# Patient Record
Sex: Female | Born: 1960 | Race: Black or African American | Hispanic: No | Marital: Single | Smoking: Former smoker
Health system: Southern US, Community
[De-identification: ages and names within clinical notes are randomized; demographics above are authoritative.]

## PROBLEM LIST (undated history)

## (undated) DIAGNOSIS — I428 Other cardiomyopathies: Secondary | ICD-10-CM

## (undated) DIAGNOSIS — E119 Type 2 diabetes mellitus without complications: Secondary | ICD-10-CM

## (undated) DIAGNOSIS — Z95810 Presence of automatic (implantable) cardiac defibrillator: Secondary | ICD-10-CM

## (undated) DIAGNOSIS — I5032 Chronic diastolic (congestive) heart failure: Secondary | ICD-10-CM

## (undated) DIAGNOSIS — G8929 Other chronic pain: Secondary | ICD-10-CM

## (undated) DIAGNOSIS — M549 Dorsalgia, unspecified: Secondary | ICD-10-CM

## (undated) DIAGNOSIS — I10 Essential (primary) hypertension: Secondary | ICD-10-CM

## (undated) DIAGNOSIS — J42 Unspecified chronic bronchitis: Secondary | ICD-10-CM

## (undated) DIAGNOSIS — G479 Sleep disorder, unspecified: Secondary | ICD-10-CM

## (undated) DIAGNOSIS — J449 Chronic obstructive pulmonary disease, unspecified: Secondary | ICD-10-CM

## (undated) DIAGNOSIS — N289 Disorder of kidney and ureter, unspecified: Secondary | ICD-10-CM

## (undated) DIAGNOSIS — F4310 Post-traumatic stress disorder, unspecified: Secondary | ICD-10-CM

## (undated) DIAGNOSIS — M069 Rheumatoid arthritis, unspecified: Secondary | ICD-10-CM

## (undated) DIAGNOSIS — R351 Nocturia: Secondary | ICD-10-CM

## (undated) DIAGNOSIS — M25569 Pain in unspecified knee: Secondary | ICD-10-CM

## (undated) DIAGNOSIS — F32A Depression, unspecified: Secondary | ICD-10-CM

## (undated) DIAGNOSIS — M545 Low back pain, unspecified: Secondary | ICD-10-CM

## (undated) DIAGNOSIS — F419 Anxiety disorder, unspecified: Secondary | ICD-10-CM

## (undated) DIAGNOSIS — Z794 Long term (current) use of insulin: Secondary | ICD-10-CM

## (undated) DIAGNOSIS — F329 Major depressive disorder, single episode, unspecified: Secondary | ICD-10-CM

## (undated) DIAGNOSIS — I5022 Chronic systolic (congestive) heart failure: Secondary | ICD-10-CM

## (undated) DIAGNOSIS — I82409 Acute embolism and thrombosis of unspecified deep veins of unspecified lower extremity: Secondary | ICD-10-CM

## (undated) DIAGNOSIS — J189 Pneumonia, unspecified organism: Secondary | ICD-10-CM

## (undated) DIAGNOSIS — G4733 Obstructive sleep apnea (adult) (pediatric): Secondary | ICD-10-CM

## (undated) DIAGNOSIS — D509 Iron deficiency anemia, unspecified: Secondary | ICD-10-CM

## (undated) DIAGNOSIS — K219 Gastro-esophageal reflux disease without esophagitis: Secondary | ICD-10-CM

## (undated) DIAGNOSIS — N946 Dysmenorrhea, unspecified: Secondary | ICD-10-CM

## (undated) DIAGNOSIS — IMO0001 Reserved for inherently not codable concepts without codable children: Secondary | ICD-10-CM

## (undated) DIAGNOSIS — R739 Hyperglycemia, unspecified: Secondary | ICD-10-CM

## (undated) DIAGNOSIS — K529 Noninfective gastroenteritis and colitis, unspecified: Secondary | ICD-10-CM

## (undated) DIAGNOSIS — I340 Nonrheumatic mitral (valve) insufficiency: Secondary | ICD-10-CM

## (undated) DIAGNOSIS — N390 Urinary tract infection, site not specified: Secondary | ICD-10-CM

## (undated) DIAGNOSIS — Z9989 Dependence on other enabling machines and devices: Secondary | ICD-10-CM

## (undated) HISTORY — DX: Gastro-esophageal reflux disease without esophagitis: K21.9

## (undated) HISTORY — DX: Rheumatoid arthritis, unspecified: M06.9

## (undated) HISTORY — DX: Major depressive disorder, single episode, unspecified: F32.9

## (undated) HISTORY — DX: Essential (primary) hypertension: I10

## (undated) HISTORY — DX: Dysmenorrhea, unspecified: N94.6

## (undated) HISTORY — DX: Iron deficiency anemia, unspecified: D50.9

## (undated) HISTORY — PX: CARDIAC CATHETERIZATION: SHX172

## (undated) HISTORY — DX: Noninfective gastroenteritis and colitis, unspecified: K52.9

## (undated) HISTORY — DX: Morbid (severe) obesity due to excess calories: E66.01

## (undated) HISTORY — PX: CARDIAC DEFIBRILLATOR PLACEMENT: SHX171

## (undated) HISTORY — DX: Depression, unspecified: F32.A

## (undated) HISTORY — DX: Nonrheumatic mitral (valve) insufficiency: I34.0

## (undated) HISTORY — DX: Acute embolism and thrombosis of unspecified deep veins of unspecified lower extremity: I82.409

---
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
## 2000-08-31 ENCOUNTER — Emergency Department (HOSPITAL_COMMUNITY): Admission: EM | Admit: 2000-08-31 | Discharge: 2000-08-31 | Payer: Self-pay | Admitting: Emergency Medicine

## 2001-02-22 ENCOUNTER — Ambulatory Visit (HOSPITAL_COMMUNITY): Admission: RE | Admit: 2001-02-22 | Discharge: 2001-02-22 | Payer: Self-pay | Admitting: Internal Medicine

## 2001-02-22 ENCOUNTER — Encounter: Payer: Self-pay | Admitting: Internal Medicine

## 2001-06-29 ENCOUNTER — Emergency Department (HOSPITAL_COMMUNITY): Admission: EM | Admit: 2001-06-29 | Discharge: 2001-06-29 | Payer: Self-pay | Admitting: Emergency Medicine

## 2001-07-03 ENCOUNTER — Emergency Department (HOSPITAL_COMMUNITY): Admission: EM | Admit: 2001-07-03 | Discharge: 2001-07-03 | Payer: Self-pay

## 2001-07-27 ENCOUNTER — Emergency Department (HOSPITAL_COMMUNITY): Admission: EM | Admit: 2001-07-27 | Discharge: 2001-07-27 | Payer: Self-pay | Admitting: Emergency Medicine

## 2004-02-17 ENCOUNTER — Emergency Department (HOSPITAL_COMMUNITY): Admission: EM | Admit: 2004-02-17 | Discharge: 2004-02-17 | Payer: Self-pay | Admitting: Emergency Medicine

## 2004-02-19 ENCOUNTER — Emergency Department (HOSPITAL_COMMUNITY): Admission: EM | Admit: 2004-02-19 | Discharge: 2004-02-20 | Payer: Self-pay | Admitting: Emergency Medicine

## 2004-02-25 ENCOUNTER — Emergency Department (HOSPITAL_COMMUNITY): Admission: EM | Admit: 2004-02-25 | Discharge: 2004-02-25 | Payer: Self-pay | Admitting: Emergency Medicine

## 2004-03-03 ENCOUNTER — Ambulatory Visit: Payer: Self-pay | Admitting: Internal Medicine

## 2004-03-05 ENCOUNTER — Ambulatory Visit: Payer: Self-pay | Admitting: Internal Medicine

## 2004-03-08 ENCOUNTER — Ambulatory Visit: Payer: Self-pay | Admitting: *Deleted

## 2004-03-22 ENCOUNTER — Ambulatory Visit: Payer: Self-pay | Admitting: Internal Medicine

## 2004-03-26 ENCOUNTER — Ambulatory Visit (HOSPITAL_COMMUNITY): Admission: RE | Admit: 2004-03-26 | Discharge: 2004-03-26 | Payer: Self-pay | Admitting: Internal Medicine

## 2004-04-05 ENCOUNTER — Ambulatory Visit: Payer: Self-pay | Admitting: Internal Medicine

## 2004-04-13 ENCOUNTER — Ambulatory Visit: Payer: Self-pay | Admitting: Internal Medicine

## 2004-05-05 ENCOUNTER — Ambulatory Visit: Payer: Self-pay | Admitting: Internal Medicine

## 2004-05-20 ENCOUNTER — Ambulatory Visit: Payer: Self-pay | Admitting: Internal Medicine

## 2004-06-11 ENCOUNTER — Ambulatory Visit (HOSPITAL_BASED_OUTPATIENT_CLINIC_OR_DEPARTMENT_OTHER): Admission: RE | Admit: 2004-06-11 | Discharge: 2004-06-11 | Payer: Self-pay | Admitting: Orthopedic Surgery

## 2004-06-11 HISTORY — PX: KNEE ARTHROSCOPY: SHX127

## 2004-07-26 ENCOUNTER — Ambulatory Visit: Payer: Self-pay | Admitting: Internal Medicine

## 2004-12-21 ENCOUNTER — Ambulatory Visit: Payer: Self-pay | Admitting: Internal Medicine

## 2004-12-29 ENCOUNTER — Ambulatory Visit: Payer: Self-pay | Admitting: Internal Medicine

## 2004-12-31 ENCOUNTER — Ambulatory Visit (HOSPITAL_COMMUNITY): Admission: RE | Admit: 2004-12-31 | Discharge: 2004-12-31 | Payer: Self-pay | Admitting: Internal Medicine

## 2005-01-24 ENCOUNTER — Ambulatory Visit: Payer: Self-pay | Admitting: Family Medicine

## 2005-01-28 ENCOUNTER — Ambulatory Visit (HOSPITAL_COMMUNITY): Admission: RE | Admit: 2005-01-28 | Discharge: 2005-01-28 | Payer: Self-pay | Admitting: Internal Medicine

## 2005-02-05 ENCOUNTER — Emergency Department (HOSPITAL_COMMUNITY): Admission: EM | Admit: 2005-02-05 | Discharge: 2005-02-05 | Payer: Self-pay | Admitting: Emergency Medicine

## 2005-02-15 ENCOUNTER — Ambulatory Visit: Payer: Self-pay | Admitting: Family Medicine

## 2005-04-18 ENCOUNTER — Ambulatory Visit: Payer: Self-pay | Admitting: Internal Medicine

## 2005-05-05 ENCOUNTER — Ambulatory Visit: Payer: Self-pay | Admitting: Internal Medicine

## 2005-06-12 ENCOUNTER — Emergency Department (HOSPITAL_COMMUNITY): Admission: EM | Admit: 2005-06-12 | Discharge: 2005-06-12 | Payer: Self-pay | Admitting: Emergency Medicine

## 2005-06-22 ENCOUNTER — Ambulatory Visit: Payer: Self-pay | Admitting: Family Medicine

## 2005-07-01 ENCOUNTER — Emergency Department (HOSPITAL_COMMUNITY): Admission: EM | Admit: 2005-07-01 | Discharge: 2005-07-01 | Payer: Self-pay | Admitting: Emergency Medicine

## 2005-07-29 ENCOUNTER — Ambulatory Visit: Payer: Self-pay | Admitting: Internal Medicine

## 2005-08-16 ENCOUNTER — Emergency Department (HOSPITAL_COMMUNITY): Admission: EM | Admit: 2005-08-16 | Discharge: 2005-08-16 | Payer: Self-pay | Admitting: Emergency Medicine

## 2005-09-23 ENCOUNTER — Ambulatory Visit: Payer: Self-pay | Admitting: Internal Medicine

## 2005-10-28 ENCOUNTER — Emergency Department (HOSPITAL_COMMUNITY): Admission: EM | Admit: 2005-10-28 | Discharge: 2005-10-28 | Payer: Self-pay | Admitting: Emergency Medicine

## 2005-11-09 ENCOUNTER — Ambulatory Visit: Payer: Self-pay | Admitting: Family Medicine

## 2005-11-14 ENCOUNTER — Ambulatory Visit: Payer: Self-pay | Admitting: Family Medicine

## 2005-11-18 ENCOUNTER — Ambulatory Visit (HOSPITAL_COMMUNITY): Admission: RE | Admit: 2005-11-18 | Discharge: 2005-11-18 | Payer: Self-pay | Admitting: Family Medicine

## 2005-11-30 ENCOUNTER — Emergency Department (HOSPITAL_COMMUNITY): Admission: EM | Admit: 2005-11-30 | Discharge: 2005-11-30 | Payer: Self-pay | Admitting: Emergency Medicine

## 2005-12-06 ENCOUNTER — Ambulatory Visit: Payer: Self-pay | Admitting: Family Medicine

## 2005-12-16 ENCOUNTER — Ambulatory Visit: Payer: Self-pay | Admitting: Family Medicine

## 2006-01-04 ENCOUNTER — Ambulatory Visit: Payer: Self-pay | Admitting: Family Medicine

## 2006-01-20 ENCOUNTER — Ambulatory Visit: Payer: Self-pay | Admitting: Family Medicine

## 2006-02-07 ENCOUNTER — Ambulatory Visit: Payer: Self-pay | Admitting: Family Medicine

## 2006-03-14 ENCOUNTER — Ambulatory Visit (HOSPITAL_BASED_OUTPATIENT_CLINIC_OR_DEPARTMENT_OTHER): Admission: RE | Admit: 2006-03-14 | Discharge: 2006-03-14 | Payer: Self-pay | Admitting: Family Medicine

## 2006-03-19 ENCOUNTER — Ambulatory Visit: Payer: Self-pay | Admitting: Internal Medicine

## 2006-03-27 ENCOUNTER — Ambulatory Visit: Payer: Self-pay | Admitting: Family Medicine

## 2006-04-11 ENCOUNTER — Ambulatory Visit: Payer: Self-pay | Admitting: Family Medicine

## 2006-04-25 ENCOUNTER — Ambulatory Visit: Payer: Self-pay | Admitting: Family Medicine

## 2006-05-19 ENCOUNTER — Ambulatory Visit: Payer: Self-pay | Admitting: Family Medicine

## 2006-06-26 ENCOUNTER — Ambulatory Visit: Payer: Self-pay | Admitting: Internal Medicine

## 2006-08-11 ENCOUNTER — Ambulatory Visit: Payer: Self-pay | Admitting: Family Medicine

## 2006-08-15 ENCOUNTER — Emergency Department (HOSPITAL_COMMUNITY): Admission: EM | Admit: 2006-08-15 | Discharge: 2006-08-15 | Payer: Self-pay | Admitting: Emergency Medicine

## 2006-08-15 ENCOUNTER — Ambulatory Visit: Payer: Self-pay | Admitting: Vascular Surgery

## 2006-08-15 ENCOUNTER — Encounter: Payer: Self-pay | Admitting: Vascular Surgery

## 2006-08-28 ENCOUNTER — Ambulatory Visit: Payer: Self-pay | Admitting: Family Medicine

## 2006-10-30 ENCOUNTER — Ambulatory Visit: Payer: Self-pay | Admitting: Family Medicine

## 2006-11-13 DIAGNOSIS — S83259A Bucket-handle tear of lateral meniscus, current injury, unspecified knee, initial encounter: Secondary | ICD-10-CM | POA: Insufficient documentation

## 2006-11-13 DIAGNOSIS — M549 Dorsalgia, unspecified: Secondary | ICD-10-CM | POA: Insufficient documentation

## 2006-11-13 DIAGNOSIS — G4733 Obstructive sleep apnea (adult) (pediatric): Secondary | ICD-10-CM | POA: Insufficient documentation

## 2006-11-13 DIAGNOSIS — E669 Obesity, unspecified: Secondary | ICD-10-CM | POA: Insufficient documentation

## 2007-01-31 ENCOUNTER — Encounter (INDEPENDENT_AMBULATORY_CARE_PROVIDER_SITE_OTHER): Payer: Self-pay | Admitting: *Deleted

## 2007-04-19 ENCOUNTER — Emergency Department (HOSPITAL_COMMUNITY): Admission: EM | Admit: 2007-04-19 | Discharge: 2007-04-19 | Payer: Self-pay | Admitting: Emergency Medicine

## 2007-04-20 ENCOUNTER — Ambulatory Visit (HOSPITAL_COMMUNITY): Admission: RE | Admit: 2007-04-20 | Discharge: 2007-04-20 | Payer: Self-pay | Admitting: Emergency Medicine

## 2007-07-31 ENCOUNTER — Ambulatory Visit: Payer: Self-pay | Admitting: Family Medicine

## 2007-10-10 ENCOUNTER — Emergency Department (HOSPITAL_COMMUNITY): Admission: EM | Admit: 2007-10-10 | Discharge: 2007-10-10 | Payer: Self-pay | Admitting: Family Medicine

## 2007-10-18 ENCOUNTER — Encounter (INDEPENDENT_AMBULATORY_CARE_PROVIDER_SITE_OTHER): Payer: Self-pay | Admitting: Family Medicine

## 2007-10-18 ENCOUNTER — Ambulatory Visit: Payer: Self-pay | Admitting: Internal Medicine

## 2007-10-18 LAB — CONVERTED CEMR LAB
ALT: 12 units/L (ref 0–35)
AST: 12 units/L (ref 0–37)
Amphetamine Screen, Ur: NEGATIVE
Basophils Absolute: 0 10*3/uL (ref 0.0–0.1)
Benzodiazepines.: POSITIVE — AB
CO2: 24 meq/L (ref 19–32)
Calcium: 9 mg/dL (ref 8.4–10.5)
Chlamydia, DNA Probe: NEGATIVE
Chloride: 107 meq/L (ref 96–112)
Cholesterol: 154 mg/dL (ref 0–200)
Creatinine, Ser: 0.78 mg/dL (ref 0.40–1.20)
GC Probe Amp, Genital: NEGATIVE
HCT: 35.3 % — ABNORMAL LOW (ref 36.0–46.0)
Lymphocytes Relative: 39 % (ref 12–46)
Lymphs Abs: 2 10*3/uL (ref 0.7–4.0)
Marijuana Metabolite: NEGATIVE
Methadone: NEGATIVE
Neutro Abs: 2.1 10*3/uL (ref 1.7–7.7)
Neutrophils Relative %: 43 % (ref 43–77)
Phencyclidine (PCP): NEGATIVE
Platelets: 362 10*3/uL (ref 150–400)
Potassium: 4.1 meq/L (ref 3.5–5.3)
Propoxyphene: NEGATIVE
RDW: 16.4 % — ABNORMAL HIGH (ref 11.5–15.5)
Rhuematoid fact SerPl-aCnc: 163 intl units/mL — ABNORMAL HIGH (ref 0–20)
Sed Rate: 53 mm/hr — ABNORMAL HIGH (ref 0–22)
Sodium: 141 meq/L (ref 135–145)
Total CHOL/HDL Ratio: 3.5
Total Protein: 7.5 g/dL (ref 6.0–8.3)
WBC: 5 10*3/uL (ref 4.0–10.5)

## 2007-10-23 ENCOUNTER — Ambulatory Visit: Payer: Self-pay | Admitting: Family Medicine

## 2007-10-30 ENCOUNTER — Emergency Department (HOSPITAL_COMMUNITY): Admission: EM | Admit: 2007-10-30 | Discharge: 2007-10-30 | Payer: Self-pay | Admitting: Licensed Clinical Social Worker

## 2007-12-04 ENCOUNTER — Emergency Department (HOSPITAL_COMMUNITY): Admission: EM | Admit: 2007-12-04 | Discharge: 2007-12-04 | Payer: Self-pay | Admitting: Emergency Medicine

## 2008-01-14 ENCOUNTER — Emergency Department (HOSPITAL_COMMUNITY): Admission: EM | Admit: 2008-01-14 | Discharge: 2008-01-15 | Payer: Self-pay | Admitting: Emergency Medicine

## 2008-02-17 ENCOUNTER — Emergency Department (HOSPITAL_COMMUNITY): Admission: EM | Admit: 2008-02-17 | Discharge: 2008-02-17 | Payer: Self-pay | Admitting: Emergency Medicine

## 2008-03-20 ENCOUNTER — Ambulatory Visit: Payer: Self-pay | Admitting: Family Medicine

## 2008-05-26 ENCOUNTER — Emergency Department (HOSPITAL_COMMUNITY): Admission: EM | Admit: 2008-05-26 | Discharge: 2008-05-26 | Payer: Self-pay | Admitting: Emergency Medicine

## 2008-06-20 ENCOUNTER — Ambulatory Visit: Payer: Self-pay | Admitting: Family Medicine

## 2008-06-20 LAB — CONVERTED CEMR LAB
Benzodiazepines.: NEGATIVE
Cocaine Metabolites: NEGATIVE
Creatinine,U: 211.7 mg/dL
Phencyclidine (PCP): NEGATIVE
Propoxyphene: NEGATIVE

## 2008-06-25 ENCOUNTER — Emergency Department (HOSPITAL_COMMUNITY): Admission: EM | Admit: 2008-06-25 | Discharge: 2008-06-25 | Payer: Self-pay | Admitting: Family Medicine

## 2008-07-28 ENCOUNTER — Emergency Department (HOSPITAL_COMMUNITY): Admission: EM | Admit: 2008-07-28 | Discharge: 2008-07-29 | Payer: Self-pay | Admitting: Emergency Medicine

## 2008-07-31 ENCOUNTER — Ambulatory Visit: Payer: Self-pay | Admitting: Family Medicine

## 2008-08-11 ENCOUNTER — Emergency Department (HOSPITAL_COMMUNITY): Admission: EM | Admit: 2008-08-11 | Discharge: 2008-08-11 | Payer: Self-pay | Admitting: Emergency Medicine

## 2008-09-11 ENCOUNTER — Ambulatory Visit: Payer: Self-pay | Admitting: Family Medicine

## 2008-10-23 ENCOUNTER — Ambulatory Visit: Payer: Self-pay | Admitting: Family Medicine

## 2008-10-30 ENCOUNTER — Ambulatory Visit: Payer: Self-pay | Admitting: Cardiology

## 2008-10-30 ENCOUNTER — Ambulatory Visit: Payer: Self-pay | Admitting: *Deleted

## 2008-10-30 ENCOUNTER — Inpatient Hospital Stay (HOSPITAL_COMMUNITY): Admission: EM | Admit: 2008-10-30 | Discharge: 2008-11-05 | Payer: Self-pay | Admitting: Emergency Medicine

## 2008-10-31 ENCOUNTER — Encounter (INDEPENDENT_AMBULATORY_CARE_PROVIDER_SITE_OTHER): Payer: Self-pay | Admitting: Internal Medicine

## 2008-11-01 ENCOUNTER — Ambulatory Visit: Payer: Self-pay | Admitting: Internal Medicine

## 2008-11-02 ENCOUNTER — Encounter: Payer: Self-pay | Admitting: Gastroenterology

## 2008-11-02 ENCOUNTER — Encounter (INDEPENDENT_AMBULATORY_CARE_PROVIDER_SITE_OTHER): Payer: Self-pay | Admitting: Gastroenterology

## 2008-11-03 ENCOUNTER — Encounter: Payer: Self-pay | Admitting: Internal Medicine

## 2008-11-05 ENCOUNTER — Encounter: Payer: Self-pay | Admitting: Internal Medicine

## 2008-11-05 ENCOUNTER — Ambulatory Visit: Payer: Self-pay | Admitting: Surgery

## 2008-11-07 ENCOUNTER — Inpatient Hospital Stay (HOSPITAL_COMMUNITY): Admission: EM | Admit: 2008-11-07 | Discharge: 2008-11-10 | Payer: Self-pay | Admitting: Emergency Medicine

## 2008-11-24 ENCOUNTER — Ambulatory Visit: Payer: Self-pay | Admitting: Thoracic Surgery (Cardiothoracic Vascular Surgery)

## 2008-11-24 ENCOUNTER — Encounter: Payer: Self-pay | Admitting: Cardiology

## 2008-11-24 DIAGNOSIS — I080 Rheumatic disorders of both mitral and aortic valves: Secondary | ICD-10-CM

## 2008-12-01 ENCOUNTER — Encounter: Payer: Self-pay | Admitting: Cardiology

## 2008-12-01 ENCOUNTER — Encounter: Admission: RE | Admit: 2008-12-01 | Discharge: 2008-12-01 | Payer: Self-pay | Admitting: Dentistry

## 2008-12-02 ENCOUNTER — Ambulatory Visit: Payer: Self-pay | Admitting: Cardiology

## 2008-12-02 ENCOUNTER — Ambulatory Visit: Payer: Self-pay | Admitting: Family Medicine

## 2008-12-03 ENCOUNTER — Telehealth: Payer: Self-pay | Admitting: Cardiology

## 2008-12-03 ENCOUNTER — Encounter: Payer: Self-pay | Admitting: Cardiology

## 2008-12-04 ENCOUNTER — Ambulatory Visit: Payer: Self-pay | Admitting: Cardiology

## 2008-12-04 ENCOUNTER — Inpatient Hospital Stay (HOSPITAL_COMMUNITY): Admission: RE | Admit: 2008-12-04 | Discharge: 2008-12-05 | Payer: Self-pay | Admitting: Dentistry

## 2008-12-04 HISTORY — PX: MULTIPLE TOOTH EXTRACTIONS: SHX2053

## 2008-12-05 ENCOUNTER — Encounter: Payer: Self-pay | Admitting: Cardiology

## 2008-12-05 ENCOUNTER — Ambulatory Visit: Payer: Self-pay | Admitting: Dentistry

## 2008-12-07 ENCOUNTER — Encounter: Payer: Self-pay | Admitting: Cardiology

## 2008-12-08 ENCOUNTER — Encounter: Payer: Self-pay | Admitting: Cardiology

## 2008-12-09 ENCOUNTER — Ambulatory Visit: Payer: Self-pay | Admitting: Cardiology

## 2008-12-09 ENCOUNTER — Ambulatory Visit: Payer: Self-pay | Admitting: Family Medicine

## 2008-12-14 HISTORY — PX: MITRAL VALVE REPLACEMENT: SHX147

## 2008-12-15 ENCOUNTER — Ambulatory Visit: Payer: Self-pay | Admitting: Thoracic Surgery (Cardiothoracic Vascular Surgery)

## 2008-12-15 ENCOUNTER — Encounter: Payer: Self-pay | Admitting: Cardiology

## 2008-12-18 ENCOUNTER — Ambulatory Visit: Payer: Self-pay | Admitting: Thoracic Surgery (Cardiothoracic Vascular Surgery)

## 2008-12-18 ENCOUNTER — Inpatient Hospital Stay (HOSPITAL_COMMUNITY)
Admission: RE | Admit: 2008-12-18 | Discharge: 2008-12-23 | Payer: Self-pay | Admitting: Thoracic Surgery (Cardiothoracic Vascular Surgery)

## 2008-12-18 HISTORY — PX: OTHER SURGICAL HISTORY: SHX169

## 2008-12-26 ENCOUNTER — Encounter: Payer: Self-pay | Admitting: Cardiology

## 2008-12-29 ENCOUNTER — Ambulatory Visit: Payer: Self-pay | Admitting: Thoracic Surgery (Cardiothoracic Vascular Surgery)

## 2008-12-29 ENCOUNTER — Encounter
Admission: RE | Admit: 2008-12-29 | Discharge: 2008-12-29 | Payer: Self-pay | Admitting: Thoracic Surgery (Cardiothoracic Vascular Surgery)

## 2008-12-29 ENCOUNTER — Encounter: Payer: Self-pay | Admitting: Cardiology

## 2009-01-12 ENCOUNTER — Encounter
Admission: RE | Admit: 2009-01-12 | Discharge: 2009-01-12 | Payer: Self-pay | Admitting: Thoracic Surgery (Cardiothoracic Vascular Surgery)

## 2009-01-12 ENCOUNTER — Ambulatory Visit: Payer: Self-pay | Admitting: Thoracic Surgery (Cardiothoracic Vascular Surgery)

## 2009-01-12 ENCOUNTER — Encounter: Payer: Self-pay | Admitting: Cardiology

## 2009-01-15 ENCOUNTER — Ambulatory Visit: Payer: Self-pay | Admitting: Family Medicine

## 2009-01-20 ENCOUNTER — Telehealth (INDEPENDENT_AMBULATORY_CARE_PROVIDER_SITE_OTHER): Payer: Self-pay | Admitting: *Deleted

## 2009-01-22 ENCOUNTER — Ambulatory Visit: Payer: Self-pay | Admitting: Cardiology

## 2009-01-22 DIAGNOSIS — I059 Rheumatic mitral valve disease, unspecified: Secondary | ICD-10-CM | POA: Insufficient documentation

## 2009-02-05 ENCOUNTER — Ambulatory Visit: Payer: Self-pay | Admitting: Cardiology

## 2009-02-09 LAB — CONVERTED CEMR LAB
CO2: 31 meq/L (ref 19–32)
Chloride: 105 meq/L (ref 96–112)
Potassium: 4.1 meq/L (ref 3.5–5.1)
Pro B Natriuretic peptide (BNP): 83 pg/mL (ref 0.0–100.0)

## 2009-02-10 ENCOUNTER — Encounter: Payer: Self-pay | Admitting: Cardiology

## 2009-02-17 ENCOUNTER — Ambulatory Visit: Payer: Self-pay | Admitting: Family Medicine

## 2009-02-24 ENCOUNTER — Encounter: Payer: Self-pay | Admitting: Cardiology

## 2009-02-24 ENCOUNTER — Ambulatory Visit (HOSPITAL_COMMUNITY): Admission: RE | Admit: 2009-02-24 | Discharge: 2009-02-24 | Payer: Self-pay | Admitting: Cardiology

## 2009-02-24 ENCOUNTER — Ambulatory Visit: Payer: Self-pay

## 2009-02-24 ENCOUNTER — Ambulatory Visit: Payer: Self-pay | Admitting: Cardiology

## 2009-03-02 ENCOUNTER — Encounter: Payer: Self-pay | Admitting: Cardiology

## 2009-03-02 ENCOUNTER — Ambulatory Visit: Payer: Self-pay | Admitting: Thoracic Surgery (Cardiothoracic Vascular Surgery)

## 2009-03-04 ENCOUNTER — Encounter: Payer: Self-pay | Admitting: Cardiology

## 2009-03-05 ENCOUNTER — Ambulatory Visit: Payer: Self-pay | Admitting: Cardiology

## 2009-03-11 ENCOUNTER — Telehealth: Payer: Self-pay | Admitting: Cardiology

## 2009-03-13 ENCOUNTER — Ambulatory Visit: Payer: Self-pay | Admitting: Cardiology

## 2009-03-13 DIAGNOSIS — E0500 Thyrotoxicosis with diffuse goiter without thyrotoxic crisis or storm: Secondary | ICD-10-CM | POA: Insufficient documentation

## 2009-03-17 ENCOUNTER — Emergency Department (HOSPITAL_COMMUNITY): Admission: EM | Admit: 2009-03-17 | Discharge: 2009-03-17 | Payer: Self-pay | Admitting: Emergency Medicine

## 2009-03-18 ENCOUNTER — Ambulatory Visit: Payer: Self-pay | Admitting: Cardiology

## 2009-03-18 LAB — CONVERTED CEMR LAB
CO2: 30 meq/L (ref 19–32)
Chloride: 102 meq/L (ref 96–112)
Glucose, Bld: 102 mg/dL — ABNORMAL HIGH (ref 70–99)
Potassium: 4 meq/L (ref 3.5–5.1)
Sodium: 141 meq/L (ref 135–145)

## 2009-03-20 LAB — CONVERTED CEMR LAB
CO2: 29 meq/L (ref 19–32)
Chloride: 102 meq/L (ref 96–112)
GFR calc non Af Amer: 61.71 mL/min (ref >60)
Glucose, Bld: 120 mg/dL — ABNORMAL HIGH (ref 70–99)
Potassium: 3.9 meq/L (ref 3.5–5.1)
Sodium: 139 meq/L (ref 135–145)

## 2009-03-31 ENCOUNTER — Ambulatory Visit: Payer: Self-pay | Admitting: Cardiology

## 2009-03-31 LAB — CONVERTED CEMR LAB
BUN: 10 mg/dL (ref 6–23)
CO2: 32 meq/L (ref 19–32)
Chloride: 103 meq/L (ref 96–112)
Glucose, Bld: 94 mg/dL (ref 70–99)
Potassium: 3.8 meq/L (ref 3.5–5.1)

## 2009-04-07 ENCOUNTER — Ambulatory Visit (HOSPITAL_COMMUNITY): Admission: RE | Admit: 2009-04-07 | Discharge: 2009-04-07 | Payer: Self-pay | Admitting: Nurse Practitioner

## 2009-04-07 ENCOUNTER — Encounter: Payer: Self-pay | Admitting: Cardiology

## 2009-04-07 ENCOUNTER — Encounter (INDEPENDENT_AMBULATORY_CARE_PROVIDER_SITE_OTHER): Payer: Self-pay | Admitting: Nurse Practitioner

## 2009-04-07 ENCOUNTER — Ambulatory Visit: Payer: Self-pay | Admitting: Cardiovascular Disease

## 2009-04-07 DIAGNOSIS — R0602 Shortness of breath: Secondary | ICD-10-CM

## 2009-04-08 ENCOUNTER — Encounter: Payer: Self-pay | Admitting: Internal Medicine

## 2009-04-13 ENCOUNTER — Emergency Department (HOSPITAL_COMMUNITY): Admission: EM | Admit: 2009-04-13 | Discharge: 2009-04-13 | Payer: Self-pay | Admitting: Emergency Medicine

## 2009-04-15 ENCOUNTER — Encounter: Payer: Self-pay | Admitting: Cardiology

## 2009-04-20 ENCOUNTER — Ambulatory Visit: Payer: Self-pay | Admitting: Cardiology

## 2009-04-20 ENCOUNTER — Inpatient Hospital Stay (HOSPITAL_BASED_OUTPATIENT_CLINIC_OR_DEPARTMENT_OTHER): Admission: RE | Admit: 2009-04-20 | Discharge: 2009-04-20 | Payer: Self-pay | Admitting: Cardiology

## 2009-04-27 ENCOUNTER — Telehealth (INDEPENDENT_AMBULATORY_CARE_PROVIDER_SITE_OTHER): Payer: Self-pay | Admitting: *Deleted

## 2009-04-29 ENCOUNTER — Encounter: Payer: Self-pay | Admitting: Cardiology

## 2009-05-01 ENCOUNTER — Ambulatory Visit: Payer: Self-pay | Admitting: Cardiology

## 2009-05-01 DIAGNOSIS — I5022 Chronic systolic (congestive) heart failure: Secondary | ICD-10-CM

## 2009-05-04 ENCOUNTER — Telehealth: Payer: Self-pay | Admitting: Cardiology

## 2009-05-04 LAB — CONVERTED CEMR LAB
BUN: 34 mg/dL — ABNORMAL HIGH (ref 6–23)
CO2: 31 meq/L (ref 19–32)
Calcium: 9.2 mg/dL (ref 8.4–10.5)
Glucose, Bld: 106 mg/dL — ABNORMAL HIGH (ref 70–99)
Sodium: 136 meq/L (ref 135–145)

## 2009-05-11 ENCOUNTER — Ambulatory Visit: Payer: Self-pay | Admitting: Thoracic Surgery (Cardiothoracic Vascular Surgery)

## 2009-05-11 ENCOUNTER — Encounter: Payer: Self-pay | Admitting: Cardiology

## 2009-05-12 ENCOUNTER — Telehealth (INDEPENDENT_AMBULATORY_CARE_PROVIDER_SITE_OTHER): Payer: Self-pay | Admitting: *Deleted

## 2009-06-09 ENCOUNTER — Ambulatory Visit: Payer: Self-pay | Admitting: Cardiology

## 2009-06-09 LAB — CONVERTED CEMR LAB
BUN: 30 mg/dL — ABNORMAL HIGH (ref 6–23)
GFR calc non Af Amer: 51.6 mL/min (ref >60)
Glucose, Bld: 100 mg/dL — ABNORMAL HIGH (ref 70–99)
Potassium: 4.7 meq/L (ref 3.5–5.1)

## 2009-06-16 ENCOUNTER — Ambulatory Visit (HOSPITAL_COMMUNITY): Admission: RE | Admit: 2009-06-16 | Discharge: 2009-06-16 | Payer: Self-pay | Admitting: Family Medicine

## 2009-06-16 ENCOUNTER — Ambulatory Visit: Payer: Self-pay | Admitting: Family Medicine

## 2009-06-16 DIAGNOSIS — K529 Noninfective gastroenteritis and colitis, unspecified: Secondary | ICD-10-CM

## 2009-06-16 HISTORY — DX: Noninfective gastroenteritis and colitis, unspecified: K52.9

## 2009-06-22 ENCOUNTER — Encounter: Payer: Self-pay | Admitting: Cardiology

## 2009-06-22 ENCOUNTER — Ambulatory Visit: Payer: Self-pay | Admitting: Cardiology

## 2009-06-22 ENCOUNTER — Ambulatory Visit (HOSPITAL_COMMUNITY): Admission: RE | Admit: 2009-06-22 | Discharge: 2009-06-22 | Payer: Self-pay | Admitting: Cardiology

## 2009-06-22 ENCOUNTER — Ambulatory Visit: Payer: Self-pay

## 2009-06-24 ENCOUNTER — Ambulatory Visit: Payer: Self-pay | Admitting: Cardiology

## 2009-06-24 DIAGNOSIS — R1011 Right upper quadrant pain: Secondary | ICD-10-CM | POA: Insufficient documentation

## 2009-06-26 ENCOUNTER — Telehealth: Payer: Self-pay | Admitting: Cardiology

## 2009-06-26 ENCOUNTER — Encounter (INDEPENDENT_AMBULATORY_CARE_PROVIDER_SITE_OTHER): Payer: Self-pay | Admitting: *Deleted

## 2009-06-27 ENCOUNTER — Encounter (INDEPENDENT_AMBULATORY_CARE_PROVIDER_SITE_OTHER): Payer: Self-pay | Admitting: *Deleted

## 2009-06-27 ENCOUNTER — Inpatient Hospital Stay (HOSPITAL_COMMUNITY): Admission: EM | Admit: 2009-06-27 | Discharge: 2009-06-29 | Payer: Self-pay | Admitting: Emergency Medicine

## 2009-06-29 LAB — CONVERTED CEMR LAB
ALT: 15 units/L (ref 0–35)
Amylase: 75 units/L (ref 27–131)
BUN: 54 mg/dL — ABNORMAL HIGH (ref 6–23)
CO2: 26 meq/L (ref 19–32)
Chloride: 101 meq/L (ref 96–112)
Creatinine, Ser: 2 mg/dL — ABNORMAL HIGH (ref 0.4–1.2)
Lipase: 26 units/L (ref 11.0–59.0)
Total Protein: 8.8 g/dL — ABNORMAL HIGH (ref 6.0–8.3)

## 2009-07-02 ENCOUNTER — Ambulatory Visit: Payer: Self-pay | Admitting: Cardiology

## 2009-07-04 LAB — CONVERTED CEMR LAB
Calcium: 8.9 mg/dL (ref 8.4–10.5)
Creatinine, Ser: 1.2 mg/dL (ref 0.4–1.2)

## 2009-07-08 ENCOUNTER — Telehealth (INDEPENDENT_AMBULATORY_CARE_PROVIDER_SITE_OTHER): Payer: Self-pay | Admitting: *Deleted

## 2009-07-08 ENCOUNTER — Ambulatory Visit: Payer: Self-pay | Admitting: Cardiology

## 2009-08-03 ENCOUNTER — Telehealth (INDEPENDENT_AMBULATORY_CARE_PROVIDER_SITE_OTHER): Payer: Self-pay | Admitting: *Deleted

## 2009-08-03 ENCOUNTER — Encounter: Payer: Self-pay | Admitting: Cardiology

## 2009-08-11 ENCOUNTER — Ambulatory Visit: Payer: Self-pay | Admitting: Cardiology

## 2009-08-18 LAB — CONVERTED CEMR LAB
GFR calc non Af Amer: 85.86 mL/min (ref >60)
Potassium: 4 meq/L (ref 3.5–5.1)
Sodium: 142 meq/L (ref 135–145)

## 2009-08-20 ENCOUNTER — Ambulatory Visit: Payer: Self-pay | Admitting: Family Medicine

## 2009-09-01 ENCOUNTER — Ambulatory Visit: Payer: Self-pay | Admitting: Cardiology

## 2009-09-22 ENCOUNTER — Ambulatory Visit: Payer: Self-pay | Admitting: Cardiology

## 2009-09-25 LAB — CONVERTED CEMR LAB
CO2: 31 meq/L (ref 19–32)
Calcium: 9.1 mg/dL (ref 8.4–10.5)
Chloride: 105 meq/L (ref 96–112)
Sodium: 142 meq/L (ref 135–145)

## 2009-10-01 ENCOUNTER — Ambulatory Visit: Payer: Self-pay | Admitting: Family Medicine

## 2009-10-01 LAB — CONVERTED CEMR LAB
Eosinophils Relative: 3 % (ref 0–5)
Free T4: 1.36 ng/dL (ref 0.80–1.80)
HCT: 34.2 % — ABNORMAL LOW (ref 36.0–46.0)
Hemoglobin: 10.4 g/dL — ABNORMAL LOW (ref 12.0–15.0)
Lymphocytes Relative: 26 % (ref 12–46)
Lymphs Abs: 1.2 10*3/uL (ref 0.7–4.0)
Neutro Abs: 2.7 10*3/uL (ref 1.7–7.7)
Platelets: 355 10*3/uL (ref 150–400)
TSH: 0.599 microintl units/mL (ref 0.350–4.500)
Vit D, 25-Hydroxy: 14 ng/mL — ABNORMAL LOW (ref 30–89)
WBC: 4.4 10*3/uL (ref 4.0–10.5)

## 2009-10-06 ENCOUNTER — Ambulatory Visit: Payer: Self-pay | Admitting: Cardiology

## 2009-10-09 LAB — CONVERTED CEMR LAB
CO2: 31 meq/L (ref 19–32)
Calcium: 9.2 mg/dL (ref 8.4–10.5)
GFR calc non Af Amer: 114.68 mL/min (ref >60)
Pro B Natriuretic peptide (BNP): 106 pg/mL — ABNORMAL HIGH (ref 0.0–100.0)
Sodium: 140 meq/L (ref 135–145)

## 2009-11-03 ENCOUNTER — Ambulatory Visit: Payer: Self-pay | Admitting: Family Medicine

## 2009-11-17 ENCOUNTER — Ambulatory Visit: Payer: Self-pay | Admitting: Internal Medicine

## 2009-11-17 ENCOUNTER — Inpatient Hospital Stay (HOSPITAL_COMMUNITY): Admission: EM | Admit: 2009-11-17 | Discharge: 2009-11-21 | Payer: Self-pay | Admitting: Emergency Medicine

## 2009-11-18 ENCOUNTER — Encounter: Payer: Self-pay | Admitting: Cardiology

## 2009-11-22 ENCOUNTER — Encounter: Payer: Self-pay | Admitting: Cardiology

## 2009-12-01 ENCOUNTER — Ambulatory Visit: Payer: Self-pay | Admitting: Cardiology

## 2009-12-02 ENCOUNTER — Telehealth: Payer: Self-pay | Admitting: Cardiology

## 2009-12-02 IMAGING — CR DG CHEST 1V PORT
1 series · 1 of 1 positions shown · non-contrast
Comparison: Chest 12/03/2008.

CLINICAL DATA: Congestive heart failure.

PORTABLE CHEST - 1 VIEW

[AP]
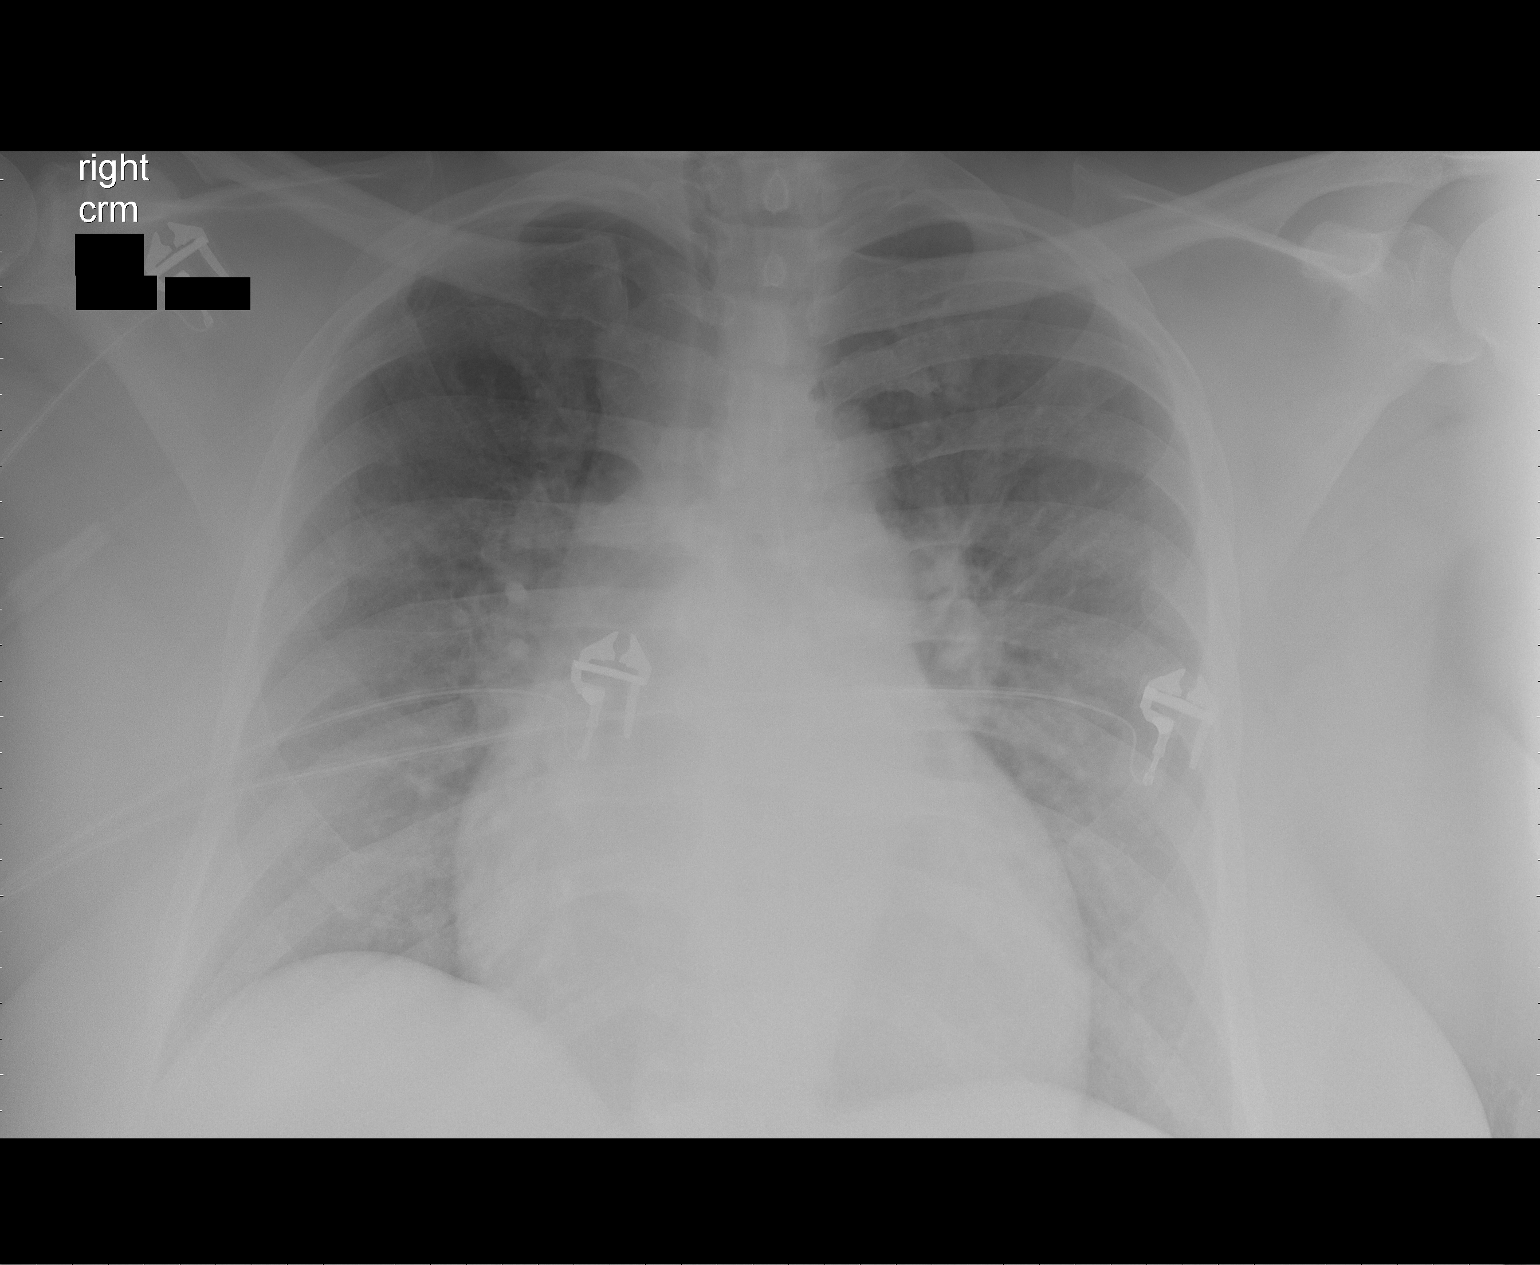

[1 of 1 positions shown; findings below may reference images not displayed]

FINDINGS: There is cardiomegaly but no pulmonary edema.  Mild
vascular congestion is noted.  No effusion.  Appearance of the
chest is not markedly changed.
IMPRESSION: Cardiomegaly and mild pulmonary vascular congestion.

## 2009-12-03 ENCOUNTER — Telehealth: Payer: Self-pay | Admitting: Cardiology

## 2009-12-04 LAB — CONVERTED CEMR LAB
GFR calc non Af Amer: 44.15 mL/min (ref >60)
Glucose, Bld: 107 mg/dL — ABNORMAL HIGH (ref 70–99)
Potassium: 4.8 meq/L (ref 3.5–5.1)
Sodium: 136 meq/L (ref 135–145)

## 2009-12-08 ENCOUNTER — Ambulatory Visit: Payer: Self-pay | Admitting: Cardiology

## 2009-12-16 ENCOUNTER — Telehealth: Payer: Self-pay | Admitting: Cardiology

## 2009-12-17 ENCOUNTER — Ambulatory Visit: Payer: Self-pay | Admitting: Family Medicine

## 2009-12-18 ENCOUNTER — Ambulatory Visit: Payer: Self-pay | Admitting: Internal Medicine

## 2009-12-21 ENCOUNTER — Ambulatory Visit: Payer: Self-pay | Admitting: Internal Medicine

## 2009-12-23 ENCOUNTER — Ambulatory Visit: Payer: Self-pay | Admitting: Cardiology

## 2009-12-28 LAB — CONVERTED CEMR LAB
CO2: 29 meq/L (ref 19–32)
Glucose, Bld: 101 mg/dL — ABNORMAL HIGH (ref 70–99)
Potassium: 4.4 meq/L (ref 3.5–5.1)
Sodium: 141 meq/L (ref 135–145)

## 2010-01-05 ENCOUNTER — Ambulatory Visit: Payer: Self-pay | Admitting: Family Medicine

## 2010-01-07 ENCOUNTER — Ambulatory Visit: Payer: Self-pay | Admitting: Internal Medicine

## 2010-01-19 ENCOUNTER — Other Ambulatory Visit: Payer: Self-pay | Admitting: Emergency Medicine

## 2010-01-19 ENCOUNTER — Inpatient Hospital Stay (HOSPITAL_COMMUNITY): Admission: EM | Admit: 2010-01-19 | Discharge: 2010-01-22 | Payer: Self-pay | Admitting: Psychiatry

## 2010-01-19 ENCOUNTER — Ambulatory Visit: Payer: Self-pay | Admitting: Psychiatry

## 2010-01-28 ENCOUNTER — Ambulatory Visit: Payer: Self-pay | Admitting: Cardiology

## 2010-01-28 DIAGNOSIS — R5381 Other malaise: Secondary | ICD-10-CM

## 2010-01-28 DIAGNOSIS — R5383 Other fatigue: Secondary | ICD-10-CM

## 2010-02-02 DIAGNOSIS — D509 Iron deficiency anemia, unspecified: Secondary | ICD-10-CM

## 2010-02-03 ENCOUNTER — Encounter (INDEPENDENT_AMBULATORY_CARE_PROVIDER_SITE_OTHER): Payer: Self-pay | Admitting: *Deleted

## 2010-02-05 ENCOUNTER — Ambulatory Visit: Payer: Self-pay | Admitting: Cardiology

## 2010-02-09 LAB — CONVERTED CEMR LAB
BUN: 29 mg/dL — ABNORMAL HIGH (ref 6–23)
Calcium: 9.1 mg/dL (ref 8.4–10.5)
Chloride: 97 meq/L (ref 96–112)
Creatinine, Ser: 1.1 mg/dL (ref 0.4–1.2)
GFR calc non Af Amer: 67.27 mL/min (ref >60)
Pro B Natriuretic peptide (BNP): 63.5 pg/mL (ref 0.0–100.0)

## 2010-02-15 ENCOUNTER — Emergency Department (HOSPITAL_COMMUNITY): Admission: EM | Admit: 2010-02-15 | Discharge: 2010-02-15 | Payer: Self-pay | Admitting: Emergency Medicine

## 2010-03-01 ENCOUNTER — Observation Stay (HOSPITAL_COMMUNITY): Admission: EM | Admit: 2010-03-01 | Discharge: 2010-03-04 | Payer: Self-pay | Admitting: Emergency Medicine

## 2010-03-01 ENCOUNTER — Encounter: Payer: Self-pay | Admitting: Cardiology

## 2010-03-01 ENCOUNTER — Telehealth: Payer: Self-pay | Admitting: Cardiology

## 2010-03-01 ENCOUNTER — Ambulatory Visit: Payer: Self-pay | Admitting: Cardiology

## 2010-03-02 ENCOUNTER — Ambulatory Visit: Payer: Self-pay | Admitting: Cardiology

## 2010-03-02 ENCOUNTER — Telehealth: Payer: Self-pay | Admitting: Gastroenterology

## 2010-03-05 ENCOUNTER — Encounter: Payer: Self-pay | Admitting: Gastroenterology

## 2010-03-08 ENCOUNTER — Ambulatory Visit: Payer: Self-pay | Admitting: Cardiology

## 2010-03-09 ENCOUNTER — Telehealth: Payer: Self-pay | Admitting: Cardiology

## 2010-03-09 LAB — CONVERTED CEMR LAB
Calcium: 8.8 mg/dL (ref 8.4–10.5)
GFR calc non Af Amer: 63.28 mL/min (ref >60)
Potassium: 4.3 meq/L (ref 3.5–5.1)
Pro B Natriuretic peptide (BNP): 172.4 pg/mL — ABNORMAL HIGH (ref 0.0–100.0)
Sodium: 136 meq/L (ref 135–145)

## 2010-03-16 ENCOUNTER — Ambulatory Visit: Payer: Self-pay | Admitting: Cardiology

## 2010-03-19 LAB — CONVERTED CEMR LAB: Pro B Natriuretic peptide (BNP): 110.7 pg/mL — ABNORMAL HIGH (ref 0.0–100.0)

## 2010-03-22 ENCOUNTER — Ambulatory Visit: Payer: Self-pay | Admitting: Cardiology

## 2010-03-22 ENCOUNTER — Telehealth (INDEPENDENT_AMBULATORY_CARE_PROVIDER_SITE_OTHER): Payer: Self-pay | Admitting: *Deleted

## 2010-03-22 LAB — CONVERTED CEMR LAB
Calcium: 8.9 mg/dL (ref 8.4–10.5)
GFR calc non Af Amer: 17.35 mL/min (ref >60)
Glucose, Bld: 99 mg/dL (ref 70–99)
Potassium: 3.6 meq/L (ref 3.5–5.1)
Sodium: 137 meq/L (ref 135–145)

## 2010-03-23 LAB — CONVERTED CEMR LAB
CO2: 30 meq/L (ref 19–32)
Chloride: 106 meq/L (ref 96–112)
Creatinine, Ser: 1.2 mg/dL (ref 0.4–1.2)
Potassium: 4.7 meq/L (ref 3.5–5.1)
Sodium: 142 meq/L (ref 135–145)

## 2010-03-24 ENCOUNTER — Ambulatory Visit: Payer: Self-pay | Admitting: Cardiovascular Disease

## 2010-03-30 ENCOUNTER — Telehealth: Payer: Self-pay | Admitting: Cardiology

## 2010-04-01 ENCOUNTER — Ambulatory Visit: Payer: Self-pay | Admitting: Cardiology

## 2010-04-12 ENCOUNTER — Ambulatory Visit: Payer: Self-pay | Admitting: Cardiology

## 2010-04-14 LAB — CONVERTED CEMR LAB
CO2: 33 meq/L — ABNORMAL HIGH (ref 19–32)
Chloride: 96 meq/L (ref 96–112)
Glucose, Bld: 108 mg/dL — ABNORMAL HIGH (ref 70–99)
Potassium: 3.7 meq/L (ref 3.5–5.1)
Sodium: 138 meq/L (ref 135–145)

## 2010-04-15 ENCOUNTER — Ambulatory Visit: Payer: Self-pay | Admitting: Cardiology

## 2010-04-16 ENCOUNTER — Ambulatory Visit: Payer: Self-pay | Admitting: Gastroenterology

## 2010-04-27 ENCOUNTER — Ambulatory Visit: Payer: Self-pay | Admitting: Gastroenterology

## 2010-04-28 LAB — CONVERTED CEMR LAB: Fecal Occult Bld: POSITIVE

## 2010-05-19 ENCOUNTER — Encounter: Payer: Self-pay | Admitting: Gastroenterology

## 2010-05-19 ENCOUNTER — Ambulatory Visit
Admission: RE | Admit: 2010-05-19 | Discharge: 2010-05-19 | Payer: Self-pay | Source: Home / Self Care | Attending: Gastroenterology | Admitting: Gastroenterology

## 2010-05-19 ENCOUNTER — Encounter (INDEPENDENT_AMBULATORY_CARE_PROVIDER_SITE_OTHER): Payer: Self-pay | Admitting: *Deleted

## 2010-05-21 ENCOUNTER — Encounter: Payer: Self-pay | Admitting: Cardiology

## 2010-05-21 ENCOUNTER — Ambulatory Visit
Admission: RE | Admit: 2010-05-21 | Discharge: 2010-05-21 | Payer: Self-pay | Source: Home / Self Care | Attending: Gastroenterology | Admitting: Gastroenterology

## 2010-05-21 ENCOUNTER — Ambulatory Visit
Admission: RE | Admit: 2010-05-21 | Discharge: 2010-05-21 | Payer: Self-pay | Source: Home / Self Care | Attending: Cardiology | Admitting: Cardiology

## 2010-05-25 ENCOUNTER — Encounter: Payer: Self-pay | Admitting: Gastroenterology

## 2010-05-25 ENCOUNTER — Ambulatory Visit
Admission: RE | Admit: 2010-05-25 | Discharge: 2010-05-25 | Payer: Self-pay | Source: Home / Self Care | Attending: Gastroenterology | Admitting: Gastroenterology

## 2010-05-26 LAB — CONVERTED CEMR LAB
BUN: 11 mg/dL (ref 6–23)
CO2: 31 meq/L (ref 19–32)
Calcium: 9.1 mg/dL (ref 8.4–10.5)
Chloride: 100 meq/L (ref 96–112)
Creatinine, Ser: 1.18 mg/dL (ref 0.40–1.20)
Glucose, Bld: 116 mg/dL — ABNORMAL HIGH (ref 70–99)
Potassium: 3.5 meq/L (ref 3.5–5.3)
Pro B Natriuretic peptide (BNP): 70 pg/mL (ref 0.0–100.0)
Sodium: 140 meq/L (ref 135–145)

## 2010-06-04 ENCOUNTER — Ambulatory Visit
Admission: RE | Admit: 2010-06-04 | Discharge: 2010-06-04 | Payer: Self-pay | Source: Home / Self Care | Attending: Gastroenterology | Admitting: Gastroenterology

## 2010-06-04 ENCOUNTER — Encounter: Payer: Self-pay | Admitting: Gastroenterology

## 2010-06-06 ENCOUNTER — Encounter: Payer: Self-pay | Admitting: Cardiology

## 2010-06-11 ENCOUNTER — Ambulatory Visit
Admission: RE | Admit: 2010-06-11 | Discharge: 2010-06-11 | Payer: Self-pay | Source: Home / Self Care | Attending: Gastroenterology | Admitting: Gastroenterology

## 2010-06-11 ENCOUNTER — Encounter: Payer: Self-pay | Admitting: Gastroenterology

## 2010-06-13 LAB — CONVERTED CEMR LAB
AST: 23 units/L (ref 0–37)
Albumin: 3.3 g/dL — ABNORMAL LOW (ref 3.5–5.2)
Alkaline Phosphatase: 113 units/L (ref 39–117)
BUN: 15 mg/dL (ref 6–23)
Basophils Absolute: 0 10*3/uL (ref 0.0–0.1)
Basophils Relative: 0 % (ref 0.0–3.0)
Chloride: 102 meq/L (ref 96–112)
Chloride: 102 meq/L (ref 96–112)
GFR calc non Af Amer: 65.27 mL/min (ref >60)
Glucose, Bld: 105 mg/dL — ABNORMAL HIGH (ref 70–99)
HCT: 29.3 % — ABNORMAL LOW (ref 36.0–46.0)
Hemoglobin: 9.7 g/dL — ABNORMAL LOW (ref 12.0–15.0)
Lymphocytes Relative: 21.3 % (ref 12.0–46.0)
Lymphs Abs: 0.9 10*3/uL (ref 0.7–4.0)
MCHC: 33.2 g/dL (ref 30.0–36.0)
Monocytes Relative: 14.5 % — ABNORMAL HIGH (ref 3.0–12.0)
Neutro Abs: 2.5 10*3/uL (ref 1.4–7.7)
Potassium: 3.9 meq/L (ref 3.5–5.1)
Potassium: 4.1 meq/L (ref 3.5–5.1)
Pro B Natriuretic peptide (BNP): 73.9 pg/mL (ref 0.0–100.0)
Pro B Natriuretic peptide (BNP): 83.5 pg/mL (ref 0.0–100.0)
RBC: 3.96 M/uL (ref 3.87–5.11)
RDW: 19.5 % — ABNORMAL HIGH (ref 11.5–14.6)
Sodium: 137 meq/L (ref 135–145)
Sodium: 141 meq/L (ref 135–145)
Total Protein: 7.4 g/dL (ref 6.0–8.3)

## 2010-06-15 NOTE — Progress Notes (Signed)
   Phone Note Outgoing Call   Summary of Call: 08/03/09-pt walk in stating she is SOB and having R leg pain at night--per dr Shirlee Latch-- please have pt gp to urgent care or [facility] ed for evaluation--if problem continues please have pt call for appoint--pt agreed and left facility--nt Initial call taken by: Ledon Snare, RN,  August 03, 2009 12:27 PM

## 2010-06-15 NOTE — Progress Notes (Signed)
Summary: wants med for nausea  Medications Added PROMETHAZINE HCL 12.5 MG TABS (PROMETHAZINE HCL) one every 6 hours as needed for nausea       Phone Note Call from Patient   Caller: Patient Reason for Call: Talk to Nurse Summary of Call: pt having nausea since hospital d/c 7-9 can get med? uses [facility] [address]  Initial call taken by: Glynda Jaeger,  December 02, 2009 12:31 PM  Follow-up for Phone Call        reviewed with Dr Milda Smart give pt promethazine 12.5mg  #20 q6hours as needed nausea--offered pt GI referral--she declined at present-she has an appt with Dr Shirlee Latch 12/08/09    New/Updated Medications: PROMETHAZINE HCL 12.5 MG TABS (PROMETHAZINE HCL) one every 6 hours as needed for nausea Prescriptions: PROMETHAZINE HCL 12.5 MG TABS (PROMETHAZINE HCL) one every 6 hours as needed for nausea  #20 x 0   Entered by:   Katina Dung, RN, BSN   Authorized by:   [name], MD   Signed by:   Katina Dung, RN, BSN on 12/02/2009   Method used:   Electronically to        [facility] [phone]* (retail)       [address]       [place], [place]  [address]       Ph: [phone]       Fax: [phone]   RxID:   [id number]

## 2010-06-15 NOTE — Progress Notes (Signed)
Summary: Pt in Hospital   ---- Converted from flag ---- ---- 03/02/2010 8:19 AM, Leanor Kail [facility] wrote: Cx appt for today 10-18 3pm because she is in the hospital. ------------------------------ Pt in hospital no charge sent

## 2010-06-15 NOTE — Progress Notes (Signed)
Summary: discuss patient meds   Phone Note Call from Patient Call back at Home Phone [phone] Call back at [phone]   Caller: Daughter Reason for Call: Talk to Nurse Details for Reason: discuss all the me meds she is on  Summary of Call: Concern about all the meds my mother is on.  I feel like all the meds she is on is causing her to have these stomach proplems.   Pt is schedule to have ABD U/S on 2/15 @ [place]. Appt was made for her to see Dr. Arlyce Dice on the 16h but patient refused, because she didn't have the $184.00 the day of the appt.  Has f/u appt with Dr. Shirlee Latch on 2/18. Initial call taken by: Omar Person,  June 26, 2009 4:47 PM  Follow-up for Phone Call        Spoke with pt's daughter. She states has some questions about her medication to discuss with Dr. Gayland Curry nurse. She wants to be call on Monday. I let her know will send message to MD's Nurse. Okay with pt. Ollen Gross, RN, BSN  June 26, 2009 5:29 PM      Appended Document: discuss patient meds pt hospitalized for  nausea/vomiting/abdominal pain D/C 06-29-09---appt 07-03-09 with Dr Shirlee Latch

## 2010-06-15 NOTE — Assessment & Plan Note (Signed)
Summary: rov   Referring Provider:  [name] Primary Provider:  Dow Adolph, MD   History of Present Illness: Patient seen today by pharmacist for medication review and education.  Current Medications (verified): 1)  Vicodin 5-500 Mg  Tabs (Hydrocodone-Acetaminophen) .... #60 Monthly, One To Two By Mouth Q 4 To 6 Hrs Prn 2)  Aspirin 81 Mg Tabs (Aspirin) .... Once Daily 3)  Citalopram Hydrobromide 40 Mg Tabs (Citalopram Hydrobromide) .... Take One Tablet By Mouth Once Daily 4)  Omeprazole 40 Mg Cpdr (Omeprazole) .... One Daily 5)  Ventolin Hfa 108 (90 Base) Mcg/act Aers (Albuterol Sulfate) .Marland Kitchen.. 1 Puff As Needed 6)  Carvedilol 6.25 Mg Tabs (Carvedilol) .... Two Times A Day 7)  Ferrex 150 150 Mg Caps (Polysaccharide Iron Complex) .... Once Daily 8)  Alprazolam 0.5 Mg Tabs (Alprazolam) .... Take One Tablet At Bedtime For Sleep 9)  Hydralazine Hcl 25 Mg Tabs (Hydralazine Hcl) .... One and One-Half   Tablets  Three Times A Day 10)  Promethazine Hcl 12.5 Mg Tabs (Promethazine Hcl) .... One Every 6 Hours As Needed For Nausea 11)  Spironolactone 25 Mg Tabs (Spironolactone) .Marland Kitchen.. 1po Daily 12)  Vitamin D (Ergocalciferol) 50000 Unit Caps (Ergocalciferol) .Marland Kitchen.. 1 By Mouth Weekly 13)  Respiridone 4mg  .... Take One Tablet Once Daily 14)  Torsemide 20 Mg Tabs (Torsemide) .... Four Tablets Daily 15)  Zaroxolyn 2.5 Mg Tabs (Metolazone) .... One Every Tuesday--Take This 30 Minutes Before You Take Torsemide. 16)  Klor-Con M20 20 Meq Cr-Tabs (Potassium Chloride Crys Cr) .... One Daily--Take Two On The Days That You Take Metolazone 17)  Isosorbide Mononitrate Cr 60 Mg Xr24h-Tab (Isosorbide Mononitrate) .... One Daily 18)  Losartan Potassium 25 Mg Tabs (Losartan Potassium) .... One Daily  Allergies (verified): 1)  ! * Colchicine  Vital Signs:  Patient profile:   50 year old female Height:      67 inches Weight:      353 pounds Pulse rhythm:   regular BP sitting:   122 / 70  (right  arm) Cuff size:   large   Impression & Recommendations:  Problem # 1:  CHF (ICD-428.0) Have reviewed extensively with patient all cardiac medications.  Use of these medications and compliance was emphasized.  Patient was also educated on diet including sodium restriction.  She was also instructed to monitor weight gain and to report any weight gain over 3 lbs.     Her updated medication list for this problem includes:    Aspirin 81 Mg Tabs (Aspirin) ..... Once daily    Carvedilol 6.25 Mg Tabs (Carvedilol) .Marland Kitchen..Marland Kitchen Two times a day    Spironolactone 25 Mg Tabs (Spironolactone) .Marland Kitchen... 1po daily    Torsemide 20 Mg Tabs (Torsemide) .Marland Kitchen... Four tablets daily    Zaroxolyn 2.5 Mg Tabs (Metolazone) ..... One every tuesday--take this 30 minutes before you take torsemide.    Isosorbide Mononitrate Cr 60 Mg Xr24h-tab (Isosorbide mononitrate) ..... One daily    Losartan Potassium 25 Mg Tabs (Losartan potassium) ..... One daily

## 2010-06-15 NOTE — Progress Notes (Signed)
   Pt.signed ROI forwarded over to [facility] for processing [facility]  July 08, 2009 12:50 PM

## 2010-06-15 NOTE — Assessment & Plan Note (Signed)
Summary: ROV  Medications Added FUROSEMIDE 40 MG TABS (FUROSEMIDE) Take one tablet by mouth daily. FUROSEMIDE 40 MG TABS (FUROSEMIDE) Take one tablet by mouth twice a day HYDRALAZINE HCL 25 MG TABS (HYDRALAZINE HCL) one  tablet three times a day ISOSORBIDE MONONITRATE CR 60 MG XR24H-TAB (ISOSORBIDE MONONITRATE) one tablet daily      Allergies Added:   Referring Provider:  [name] Primary Provider:  Dow Adolph, MD  CC:  SOB with exertion.  History of Present Illness: 50 yo with systolic CHF probably secondary to severe MR now s/p MV repair, rheumatoid arthritis, and PUD presents for followup. She was recently in the hospital with colitis, likely infectious.  This resolved with antibiotics and pain control.  She was discharged on 2/14.  She is no longer having abdominal pain.  Lasix was decreased to 40 mg once a day because of dehydration (poor by mouth intake at time of last admission with colitis).   Main complaint today is severe left knee pain.  She has quite significant osteoarthritis and needs the knee replaced.  She is going to have to wait until she can get disability and insurance coverage to get an operation.  She also has chronic back pain/spasm.  These combine to limit her exercise capacity considerably.  She is walking with a walker because of the knee.  She is not short of breath walking in her house.  No orthopnea or PND.    Labs (1/11): BNP 92, K 4.7, creatinine 1.4 Labs (2/11): K 4.3, creatinine 1.2  Current Medications (verified): 1)  Vicodin 5-500 Mg  Tabs (Hydrocodone-Acetaminophen) .... #60 Monthly, One To Two By Mouth Q 4 To 6 Hrs Prn 2)  Aspirin 81 Mg Tabs (Aspirin) .... Once Daily 3)  Citalopram Hydrobromide 40 Mg Tabs (Citalopram Hydrobromide) .... Take One Tablet By Mouth Once Daily 4)  Protonix 40 Mg Pack (Pantoprazole Sodium) .... Once Daily 5)  Ventolin Hfa 108 (90 Base) Mcg/act Aers (Albuterol Sulfate) .Marland Kitchen.. 1 Puff As Needed 6)  Carvedilol 6.25 Mg  Tabs (Carvedilol) .... Two Times A Day 7)  Furosemide 40 Mg Tabs (Furosemide) .... Take One Tablet By Mouth Twice A Day 8)  Ferrex 150 150 Mg Caps (Polysaccharide Iron Complex) .... Once Daily 9)  Lisinopril 20 Mg Tabs (Lisinopril) .Marland Kitchen.. 1 By Mouth One Time A Day 10)  Alprazolam 0.5 Mg Tabs (Alprazolam) .... Take One Tablet At Bedtime For Sleep 11)  Glucosamine-Chondroitin   Caps (Glucosamine-Chondroit-Vit C-Mn) .... As Directed 12)  Hydralazine Hcl 25 Mg Tabs (Hydralazine Hcl) .... One  Tablet Three Times A Day 13)  Isosorbide Mononitrate Cr 60 Mg Xr24h-Tab (Isosorbide Mononitrate) .... One Tablet Daily  Allergies (verified): 1)  ! * Colchicine  Past History:  Past Medical History:  1. Congestive heart failure, most likely secondary to severe mitral regurgitation.   TEE (6/10): EF 40%, diffuse hypokinesis,  mild to moderately dilated LV, severe eccentric MR, small PFO.  Cardiac MRI (6/10): EF 37% with global hypokinesis, moderate to severe MR, no delayed enhancement (no evidence for sarcoidosis or other infiltrative disease).  TTE (10/10) after MV repair: EF 25-30%, moderately dilated LV, moderate diastolic dysfunction, mildly depressed RV function, trivial MR, MV mean gradient 5 mmHg, PASP 30 mmHg.  RHC (12/10): mean RA 19, PA 46/26, mean PCWP 28, CI 2.5, SVO2 63%.  TTE (2/11): EF 35-40% with diffuse hypokinesis, no significant MR or MS.   2. Severe mitral regurgitation (see above).  Minimally invasive mitral valve repair on 12/18/08.  She additionally had TV repair and PFO closure.   3. Microcytic anemia, secondary to chronic gastric blood loss.   4. Gastric ulcers.   5. Morbid obesity.   6. Obstructive sleep apnea, on CPAP.   7. GERD.   8. Hypertension, controlled.   9. Rheumatoid arthritis  10. Gout.   11. Depression.   12. LHC (6/10): No angiographic CAD.  Mildly dilated LV. 3+ MR.  EF 40%.   13. Prior smoking, quit   14. Colitis (2/11)  Family History: Reviewed history from  11/24/2008 and no changes required. Negative for cardiac or renal disease.   Social History: Reviewed history from 04/07/2009 and no changes required. Single, 3 children.  Works at [employer].  Tobacco Use - Yes.  Smoked < 1 ppd, quit 6/10.  Alcohol Use - no Regular Exercise - no Drug Use - no, hx crack-cocaine use  Review of Systems       All systems reviewed and negative except as per HPI.   Vital Signs:  Patient profile:   50 year old female Height:      67 inches Weight:      297 pounds BMI:     46.68 Pulse rate:   80 / minute BP sitting:   110 / 70  (right arm) Cuff size:   regular  Vitals Entered By: Hardin Negus, RMA (July 08, 2009 11:22 AM)  Physical Exam  General:  Well developed, well nourished, in no acute distress. Obese.  Neck:  Neck supple, JVP does not appear significantly elevated. No masses, thyromegaly or abnormal cervical nodes. Lungs:  Distant breath sounds, no crackles.  Heart:  Lateral PMI, chest non-tender; regular rate and rhythm, S1, S2 with no murmur noted.  No S3. Carotid upstroke normal, no bruit. Pedals normal pulses. Trace ankle edema.  Abdomen:  Bowel sounds positive; abdomen soft without masses, organomegaly, or hernias noted. No hepatosplenomegaly. Obese.  Extremities:  No clubbing or cyanosis. Neurologic:  Alert and oriented x 3. Psych:  Normal affect.   Impression & Recommendations:  Problem # 1:  CHRONIC SYSTOLIC HEART FAILURE (ICD-428.22) She does not seem significantly volume overloaded today though exam is limited by body habitus.  She is limited by her knee pain more than anything else.  She has lost 4 lbs since last appointment.  I will have her increase Lasix to 40 mg bid (dose prior to hospitalization was 80 mg bid).  Will increase hydralazine to 25 mg three times a day and Imdur to 60 mg daily.  Continue same doses of Coreg and lisinopril.  She will followup in 2 months with BMET and BNP prior to appointment.    Problem # 2:  MITRAL VALVE DISORDERS (ICD-424.0) No significant MR or MS s/p MV repair on most recent echo.   Patient Instructions: 1)  Your physician has recommended you make the following change in your medication:  2)  Increase Lasix(furosemide) to 40mg  twice a day 3)  Increase Hydralazine to 25mg  three times a day 4)  Increase Imdur(isosorbide) to 60mg  daily 5)  Your physician recommends that you schedule a follow-up appointment in: 2 months with Dr Clancy Gourd a lab test---BMP---a few days before the appointment with Dr Shirlee Latch in 2 months  428.22 Prescriptions: ISOSORBIDE MONONITRATE CR 60 MG XR24H-TAB (ISOSORBIDE MONONITRATE) one tablet daily  #30 x 6   Entered by:   Katina Dung, RN, BSN   Authorized by:   [name], MD   Signed by:   Katina Dung,  RN, BSN on 07/08/2009   Method used:   Electronically to        [facility] [phone]* (retail)       [address]       [place], [place]  [address]       Ph: [phone]       Fax: [phone]   RxID:   [id number] HYDRALAZINE HCL 25 MG TABS (HYDRALAZINE HCL) one  tablet three times a day  #90 x 6   Entered by:   Katina Dung, RN, BSN   Authorized by:   [name], MD   Signed by:   Katina Dung, RN, BSN on 07/08/2009   Method used:   Electronically to        [facility] [phone]* (retail)       [address]       [place], [place]  [address]       Ph: [phone]       Fax: [phone]   RxID:   [id number] FUROSEMIDE 40 MG TABS (FUROSEMIDE) Take one tablet by mouth twice a day  #60 x 6   Entered by:   Katina Dung, RN, BSN   Authorized by:   [name], MD   Signed by:   Katina Dung, RN, BSN on 07/08/2009   Method used:   Electronically to        [facility] [phone]* (retail)       [address]       [place], [place]  [address]       Ph: [phone]       Fax: [phone]   RxID:   [id number]

## 2010-06-15 NOTE — Progress Notes (Signed)
  Phone Note Other Incoming   Request: Send information Summary of Call: Request for records received from [employer]. Request forwarded to [facility].     

## 2010-06-15 NOTE — Assessment & Plan Note (Signed)
Summary: per check out/sf  Medications Added LASIX 40 MG TABS (FUROSEMIDE) 2 by mouth two times a day LISINOPRIL 20 MG TABS (LISINOPRIL) 1 by mouth two times a day FLEXERIL 10 MG TABS (CYCLOBENZAPRINE HCL) 1 by mouth two times a day      Allergies Added:    [place] CHF PCP:  [facility]     Primary Cardiologist:  [name], MD NYHA Class:  II     CHF Etiology:  Nonischemic Patient Status:  Active CHF Visit Date 06/09/2009 Date of last Echo 03/09/2009 EF on last Echo 25 Date of Last CHF Visit  04/07/2009   Alcohol Use no Tobacco Use current Drug Use no BNP  94.0 Weight:  309    Visit Type:  Follow-up Referring Provider:  [name] Primary Provider:  Dow Adolph, MD  CC:  leg and back pain.  History of Present Illness: [name] returns today for followup. She is a 50 yo AAF with history of rheumatoid arthritis and CHF secondary to severe MR.  I saw [name] here in clinic a few months ago. She c/o ongoing significant exertional shortness of breath and I had difficulty gauging volume status on exam.Therefore, Dr Shirlee Latch did  right heart catheterization on her.  This showed significant elevated right and left heart filling pressures.  He then iincreased her metolozone frequency to twice a week .[name] intially lost  about 7 lbs.  She is breathing much better and thinks that she could walk a city block length without stopping from dyspnea but is limited from back and leg pain now.  She in general has not been very active due to pain from rheumatoid arthritis.    Labs (12/17): K 4.7, creatinine 1.5, BNP 94. Mr Shirlee Latch ordered a repeat ECHO on Jan 11 but pt "no showed" for appt. and f/u lab work.  However, her weight is back up 14 pounds but she denies any new or worsening dyspnea. Note, [name] had corned beef hash for breakfast and hot wings for dinner last night.  CHF History:      She complains of palpitations, dyspnea with exertion, and orthopnea.  Daily  weights are not being checked.  She does not understand fluid management and sodium restriction and is not following the regimen.  The patient expresses understanding of the treatment plan and her medications.  She admits to being compliant with her medications.  ADL's are not being done without symptoms or restrictions.  The patient is enrolled in (or has completed) the CHF Eduction Program.    Problems Prior to Update: 1)  Chronic Systolic Heart Failure  (ICD-428.22) 2)  Dyspnea  (ICD-786.05) 3)  Graves' Disease  (ICD-242.00) 4)  Mitral Valve Disorders  (ICD-424.0) 5)  CHF  (ICD-428.0) 6)  Mitral Regurgitation, Severe  (ICD-396.3) 7)  Arthritis, Generalized (SEE COMMENTS)  (ICD-716.99) 8)  Tear, Bucket, Lateral Meniscus  (ICD-717.41) 9)  Backache Nos  (ICD-724.5) 10)  Obesity Nos  (ICD-278.00) 11)  Symptom, Apnea, Sleep Nos  (ICD-780.57)  Current Medications (verified): 1)  Vicodin 5-500 Mg  Tabs (Hydrocodone-Acetaminophen) .... #60 Monthly, One To Two By Mouth Q 4 To 6 Hrs Prn 2)  Aspirin 81 Mg Tabs (Aspirin) .... Once Daily 3)  Citalopram Hydrobromide 40 Mg Tabs (Citalopram Hydrobromide) .... Take One Tablet By Mouth Once Daily 4)  Protonix 40 Mg Pack (Pantoprazole Sodium) .... Once Daily 5)  Ventolin Hfa 108 (90 Base) Mcg/act Aers (Albuterol Sulfate) .Marland Kitchen.. 1 Puff As Needed 6)  Carvedilol 6.25 Mg Tabs (Carvedilol) .Marland KitchenMarland KitchenMarland Kitchen  Two Times A Day 7)  Lasix 40 Mg Tabs (Furosemide) .... 2 By Mouth Two Times A Day 8)  Klor-Con M20 20 Meq Cr-Tabs (Potassium Chloride Crys Cr) .Marland Kitchen.. 1 Tab By Mouth Once Daily 9)  Ferrex 150 150 Mg Caps (Polysaccharide Iron Complex) .... Once Daily 10)  Lisinopril 20 Mg Tabs (Lisinopril) .Marland Kitchen.. 1 By Mouth Two Times A Day 11)  Alprazolam 0.5 Mg Tabs (Alprazolam) .... Take One Tablet At Bedtime For Sleep 12)  Spironolactone 25 Mg Tabs (Spironolactone) .Marland Kitchen.. 1 Daily 13)  Glucosamine-Chondroitin   Caps (Glucosamine-Chondroit-Vit C-Mn) .... As Directed 14)  Metolazone 2.5 Mg Tabs  (Metolazone) .... One Tablet On Wed and Saturday 15)  Hydralazine Hcl 25 Mg Tabs (Hydralazine Hcl) .... One-Half Tablet Three Times A Day 16)  Imdur 30 Mg Xr24h-Tab (Isosorbide Mononitrate) .... One Tablet Daily 17)  Flexeril 10 Mg Tabs (Cyclobenzaprine Hcl) .Marland Kitchen.. 1 By Mouth Two Times A Day  Allergies (verified): 1)  ! * Colchicine  Past History:  Past Medical History: Last updated: 05/01/2009  1. Congestive heart failure, most likely secondary to severe mitral regurgitation.   TEE (6/10): EF 40%, diffuse hypokinesis,  mild to moderately dilated LV, severe eccentric MR, small PFO.  Cardiac MRI (6/10): EF 37% with global hypokinesis, moderate to severe MR, no delayed enhancement (no evidence for sarcoidosis or other infiltrative disease).  TTE (10/10) after MV repair: EF 25-30%, moderately dilated LV, moderate diastolic dysfunction, mildly depressed RV function, trivial MR, MV mean gradient 5 mmHg, PASP 30 mmHg.  RHC (12/10): mean RA 19, PA 46/26, mean PCWP 28, CI 2.5, SVO2 63%.   2. Severe mitral regurgitation (see above).  Minimally invasive mitral valve repair on 12/18/08.  She additionally had TV repair and PFO closure.   3. Microcytic anemia, secondary to chronic gastric blood loss.   4. Gastric ulcers.   5. Morbid obesity.   6. Obstructive sleep apnea, on CPAP.   7. GERD.   8. Hypertension, controlled.   9. Rheumatoid arthritis  10. Gout.   11. Depression.   12. LHC (6/10): No angiographic CAD.  Mildly dilated LV. 3+ MR.  EF 40%.   13. Prior smoking, quit 6/10.   Family History: Reviewed history from 11/24/2008 and no changes required. Negative for cardiac or renal disease.   Social History: Reviewed history from 04/07/2009 and no changes required. Single, 3 children.  Works at [employer].  Tobacco Use - Yes.  Smoked < 1 ppd, quit 6/10.  Alcohol Use - no Regular Exercise - no Drug Use - no, hx crack-cocaine use  Review of Systems       All other system negative  except as mentioned in history and problem list   Physical Exam  General:  Well developed, well nourished, in no acute distress. Head:  normocephalic and atraumatic Lungs:  Distant breath sounds, no crackles.  Heart:  Lateral PMI, chest non-tender; regular rate and rhythm, S1, S2 with no murmur noted.  No S3. Carotid upstroke normal, no bruit. Pedals normal pulses. No edema.  Abdomen:  Bowel sounds positive; abdomen soft and non-tender without masses, organomegaly, or hernias noted. No hepatosplenomegaly. Pulses:  pulses normal in all 4 extremities Extremities:  No clubbing or cyanosis. Neurologic:  Alert and oriented x 3. Psych:  Normal affect.    Vital Signs:  Patient profile:   50 year old female Height:      67 inches Weight:      309 pounds BMI:  48.57 Pulse rate:   95 / minute Resp:     16 per minute BP sitting:   107 / 75  (right arm)  Vitals Entered By: Marrion Coy, CNA (June 09, 2009 2:44 PM)  Other Orders: TLB-BMP (Basic Metabolic Panel-BMET) (80048-METABOL) TLB-BNP (B-Natriuretic Peptide) (83880-BNPR) Echocardiogram (Echo)  CHF Assessment/Plan:      The patient's current weight is 309 pounds.  Her previous weight was 295 pounds.  Her last cardiac ejection fraction was 25 % on 02/24/2009.  The CHF education was provided to the patient and significant other.    Patient Instructions: 1)  Your physician recommends that you schedule a follow-up appointment in: Dr. Shirlee Latch after echocardiogram 2)  Your physician recommends that you continue on your current medications as directed. Please refer to the Current Medication list given to you today. 3)  Your physician has requested that you have an echocardiogram.  Echocardiography is a painless test that uses sound waves to create images of your heart. It provides your doctor with information about the size and shape of your heart and how well your heart's chambers and valves are working.  This procedure takes  approximately one hour. There are no restrictions for this procedure.

## 2010-06-15 NOTE — Assessment & Plan Note (Signed)
Summary: 1 month rov.sl/pt had labwork done 03-02-10 [facility] offi...      Allergies Added:    Visit Type:  1 month follow-up Referring Provider:  [name] Primary Provider:  Dow Adolph, MD  CC:  shortness of breath, CP, and fatigued.  History of Present Illness: 50 yo with systolic CHF probably secondary to severe MR now s/p MV repair, rheumatoid arthritis, and PUD presents for followup. She was recently hospitalized for foot pain and lower extremity swelling and treated with IV Lasix for a CHF exacerbation.  She was discharged last Friday but was not able to pick up her diuretics from the pharmacy (had apparently used up her Medicaid prescription coverage for the current period).  She has not taken any diuretics since discharge.  Weight is up 14 lbs compared to the prior appointment.  She is short of breath after walking about 20 feet and has continued orthopnea.    Labs (5/11): K 3.9, creatinine 0.9, BNP 124 Labs (12/01/09): K 4.8, creatinine 1.6, HCT 27.8 Labs (12/08/09) K 4.1, creatinine 1.1, BNP 73 Labs (12/23/09) K 4.4, creatinine 1.1, BNP 145 Labs (9/11): K 3.9, creatinine 1.0, BNP 84, LFTs normal, HCT 29.3 Labs (10/11): K 4.0, creatinine 1.55, uric acid 13, TSH normal, BNP 75   Problems Prior to Update: 1)  Anemia, Iron Deficiency  (ICD-280.9) 2)  Lethargy  (ICD-780.79) 3)  Abdominal Pain Right Upper Quadrant  (ICD-789.01) 4)  Chronic Systolic Heart Failure  (ICD-428.22) 5)  Dyspnea  (ICD-786.05) 6)  Graves' Disease  (ICD-242.00) 7)  Mitral Valve Disorders  (ICD-424.0) 8)  CHF  (ICD-428.0) 9)  Mitral Regurgitation, Severe  (ICD-396.3) 10)  Arthritis, Generalized (SEE COMMENTS)  (ICD-716.99) 11)  Tear, Bucket, Lateral Meniscus  (ICD-717.41) 12)  Backache Nos  (ICD-724.5) 13)  Obesity Nos  (ICD-278.00) 14)  Symptom, Apnea, Sleep Nos  (ICD-780.57)  Current Medications (verified): 1)  Vicodin 5-500 Mg  Tabs (Hydrocodone-Acetaminophen) .... #60 Monthly, One  To Two By Mouth Q 4 To 6 Hrs Prn 2)  Aspirin 81 Mg Tabs (Aspirin) .... Once Daily 3)  Citalopram Hydrobromide 40 Mg Tabs (Citalopram Hydrobromide) .... Take One Tablet By Mouth Once Daily 4)  Omeprazole 40 Mg Cpdr (Omeprazole) .... One Daily 5)  Ventolin Hfa 108 (90 Base) Mcg/act Aers (Albuterol Sulfate) .Marland Kitchen.. 1 Puff As Needed 6)  Carvedilol 6.25 Mg Tabs (Carvedilol) .... Two Times A Day 7)  Ferrex 150 150 Mg Caps (Polysaccharide Iron Complex) .... Once Daily 8)  Cozaar 50 Mg Tabs (Losartan Potassium) .... One Daily 9)  Alprazolam 0.5 Mg Tabs (Alprazolam) .... Take One Tablet At Bedtime For Sleep 10)  Hydralazine Hcl 25 Mg Tabs (Hydralazine Hcl) .... One and One-Half   Tablets  Three Times A Day 11)  Isosorbide Mononitrate Cr 60 Mg Xr24h-Tab (Isosorbide Mononitrate) .... One Tablet Daily 12)  Promethazine Hcl 12.5 Mg Tabs (Promethazine Hcl) .... One Every 6 Hours As Needed For Nausea 13)  Spironolactone 25 Mg Tabs (Spironolactone) .Marland Kitchen.. 1po Daily 14)  Vitamin D (Ergocalciferol) 50000 Unit Caps (Ergocalciferol) .Marland Kitchen.. 1 By Mouth Weekly 15)  Respiridone 4mg  .... Take One Tablet Once Daily 16)  Torsemide 20 Mg Tabs (Torsemide) .... Four Tablets Daily 17)  Zaroxolyn 2.5 Mg Tabs (Metolazone) .... One Every Friday--Take This 30 Minutes Before You Take Torsemide. 18)  Klor-Con M20 20 Meq Cr-Tabs (Potassium Chloride Crys Cr) .... One Daily--Take Two On The Days That You Take Metolazone  Allergies (verified): 1)  ! * Colchicine  Past History:  Past Medical History: Reviewed history from 01/28/2010 and no changes required.  1. Congestive heart failure, most likely secondary to severe mitral regurgitation.   TEE (6/10): EF 40%, diffuse hypokinesis,  mild to moderately dilated LV, severe eccentric MR, small PFO.  Cardiac MRI (6/10): EF 37% with global hypokinesis, moderate to severe MR, no delayed enhancement (no evidence for sarcoidosis or other infiltrative disease).  TTE (10/10) after MV repair: EF  25-30%, moderately dilated LV, moderate diastolic dysfunction, mildly depressed RV function, trivial MR, MV mean gradient 5 mmHg, PASP 30 mmHg.  RHC (12/10): mean RA 19, PA 46/26, mean PCWP 28, CI 2.5, SVO2 63%.  TTE (2/11): EF 35-40% with diffuse hypokinesis, no significant MR or MS.  TTE (7/11): EF 45%, mild global hypokinesis, s/p MV repair, no mitral regurgitation, minimal mitral stenosis, PA systolic pressure 40 mmHg, mild LV dilation.   2. Severe mitral regurgitation (see above).  Minimally invasive mitral valve repair on 12/18/08.  She additionally had TV repair and PFO closure.   3. Microcytic anemia, secondary to chronic gastric blood loss.   4. Gastric ulcers.   5. Morbid obesity.   6. Obstructive sleep apnea, on CPAP.   7. GERD.   8. Hypertension, controlled.   9. Rheumatoid arthritis  10. Gout.   11. Depression.   12. LHC (6/10): No angiographic CAD.  Mildly dilated LV. 3+ MR.  EF 40%.   13. Prior smoking, quit   14. Colitis (2/11)  15. Auditory hallucinations, newly on Risperdal  Family History: Reviewed history from 11/24/2008 and no changes required. Negative for cardiac or renal disease.   Social History: Reviewed history from 09/22/2009 and no changes required. Single, 3 children.  Unemployed.  Tobacco Use - Yes.  Smoked < 1 ppd, quit 6/10.  Alcohol Use - no Regular Exercise - no Drug Use - no, hx crack-cocaine use  Review of Systems       All systems reviewed and negative except as per HPI.   Vital Signs:  Patient profile:   50 year old female Height:      67 inches Weight:      347.38 pounds BMI:     54.60 Pulse rate:   90 / minute BP sitting:   98 / 64  (right arm) Cuff size:   regular  Vitals Entered By: Caralee Ates CMA (March 08, 2010 12:18 PM)  Physical Exam  General:  Well developed, well nourished, in no acute distress. Obese.  Neck:  Neck supple, JVP 10+ cm. No masses, thyromegaly or abnormal cervical nodes. Lungs:  Slight bibasilar crackles.    Heart:  Lateral PMI, chest non-tender; regular rate and rhythm, S1, S2 with no murmur noted.  No S3. Carotid upstroke normal, no bruit. Pedals normal pulses. 1+ edema 1/3 up lower legs bilaterally.  Abdomen:  Bowel sounds positive; abdomen soft and non-tender without masses, organomegaly, or hernias noted. No hepatosplenomegaly. Extremities:  No clubbing or cyanosis. Neurologic:  Alert and oriented x 3. Psych:  Normal affect.   Impression & Recommendations:  Problem # 1:  CHRONIC SYSTOLIC HEART FAILURE (ICD-428.22) Patient was recently hospitalized with volume overload and discharged last Friday.  She has had no diuretics since discharge, and weight is up with volume overload on exam and NYHA class III symptoms.   - Continue current Coreg, hydralazine/Imdur, losartan, and spironolactone.   - We will call Medicaid and social work and make sure that one way or another she can get torsemide and metolozone today.  She  will take torsemide 80 mg with metolozone 2.5 mg prior to torsemide for 2 days in a row.  Then she will take torsemide 80 mg daily and metolozone once weekly.  - Followup in 1 week. - BMET/BNP today and in 1 week.   Problem # 2:  MITRAL VALVE DISORDERS (ICD-424.0) Most recent Echo no significant MR or MS.    Problem # 3:  GOUT I suspect that some of the patient's foot pain while in the hospital was related to a gout flare.  She continues to have some mild ankle/foot pain. She has been unable to take colchicine (intolerant) and I would like to avoid systemic steroids with volume overload.  Would consider use of allopurinol or Uloric to try to prevent recurrence when this current gout flare subsides (would avoid allopurinol with acute flare as it could acutely worsen symptoms).   Other Orders: TLB-BMP (Basic Metabolic Panel-BMET) (80048-METABOL) TLB-BNP (B-Natriuretic Peptide) (83880-BNPR)  Patient Instructions: 1)  Your physician has recommended you make the following change in  your medication:  2)  Take Torsemide 80mg  daily--this will be four  20mg  tablets daily. 3)  Take Zaroxolyn 2.5mg   30 minutes before Torsemide today and tomorrow.  Take Zaroxolyn every Tuesday after tomorrow. 4)  Take KCL(potassium) 20 mEq daily--the days that you take Zaroxolyn take 40 mEq daily(two tablets). 5)  Take Hydralazine 25mg  one and one-half tablets three times a day. 6)  Lab today AND in 1 week--BMP/BNP 428.22  7)  Your physician recommends that you schedule a follow-up appointment in: 1 week with Dr Shirlee Latch.

## 2010-06-15 NOTE — Letter (Signed)
Summary: WALK-IN PATIENT FORM  WALK-IN PATIENT FORM   Imported By: Marylou Mccoy 10/15/2009 15:48:11  _____________________________________________________________________  External Attachment:    Type:   Image     Comment:   External Document

## 2010-06-15 NOTE — Assessment & Plan Note (Signed)
Summary: rov/jml  Medications Added FUROSEMIDE 80 MG TABS (FUROSEMIDE) take one tablet in the morning and one  tablet in the evening * RESPIRIDONE 4MG  take one tablet once daily TORSEMIDE 20 MG TABS (TORSEMIDE) four tablets daily      Allergies Added:   Referring Provider:  [name] Primary Provider:  Dow Adolph, MD   History of Present Illness: 50 yo with systolic CHF probably secondary to severe MR now s/p MV repair, rheumatoid arthritis, and PUD presents for followup.  Has just been discharged 2 weeks ago from [facility] for homicidal ideation.  Given risperadone for auditory hallucinations there.  Feels confused and very drowsy with some new cognitive delays since starting the Risperadone.  Still with auditory hallucinations, Risperdal increased yesterday, resulting in further drowsiness.    Since she was last seen here in the office, is complaining of weight gain, fatigue, and increasing shortness of breath.   In August, she switched from Lasix 80 mg two times a day to 80 mg in AM and 40 mg in PM, but this resulted in increasing cough, 2 lb weight gain consecutively for 3 days, and increased shortness of breath.  This resolved once she increased her Lasix back to 80 mg two times a day.  Unfortunately, her weight is up 10 lbs since I last saw her.   Living with daughter and less control of diet since she moved in with them, attributes weight gain to this as well as new medications.  With increased shortness of breath since last visit in July, mostly exacerbated by exertion (just walking around her house) and bending over.  States  back and lower extremity pain limit mobility more than shortness of breath.  Requires walker for mobilization.  She still props herself up on several pillows at night.   Labs (1/11): BNP 92, K 4.7, creatinine 1.4 Labs (2/11): K 4.3, creatinine 1.2 Labs (3/11): K 4, creatinine 0.9 Labs (5/11): K 3.9, creatinine 0.9, BNP 124 Labs (12/01/09): K  4.8, creatinine 1.6, HCT 27.8 Labs (12/08/09) K 4.1, creatinine 1.1, BNP 73 Labs (12/23/09) K 4.4, creatinine 1.1, BNP 145  I saw Mrs Armas today with resident MD Payton Mccallum and agree with note and plan as written.   Current Problems (verified): 1)  Abdominal Pain Right Upper Quadrant  (ICD-789.01) 2)  Chronic Systolic Heart Failure  (ICD-428.22) 3)  Dyspnea  (ICD-786.05) 4)  Graves' Disease  (ICD-242.00) 5)  Mitral Valve Disorders  (ICD-424.0) 6)  CHF  (ICD-428.0) 7)  Mitral Regurgitation, Severe  (ICD-396.3) 8)  Arthritis, Generalized (SEE COMMENTS)  (ICD-716.99) 9)  Tear, Bucket, Lateral Meniscus  (ICD-717.41) 10)  Backache Nos  (ICD-724.5) 11)  Obesity Nos  (ICD-278.00) 12)  Symptom, Apnea, Sleep Nos  (ICD-780.57)  Current Medications (verified): 1)  Vicodin 5-500 Mg  Tabs (Hydrocodone-Acetaminophen) .... #60 Monthly, One To Two By Mouth Q 4 To 6 Hrs Prn 2)  Aspirin 81 Mg Tabs (Aspirin) .... Once Daily 3)  Citalopram Hydrobromide 40 Mg Tabs (Citalopram Hydrobromide) .... Take One Tablet By Mouth Once Daily 4)  Omeprazole 40 Mg Cpdr (Omeprazole) .... One Daily 5)  Ventolin Hfa 108 (90 Base) Mcg/act Aers (Albuterol Sulfate) .Marland Kitchen.. 1 Puff As Needed 6)  Carvedilol 6.25 Mg Tabs (Carvedilol) .... Two Times A Day 7)  Furosemide 80 Mg Tabs (Furosemide) .... Take One Tablet in The Morning and One  Tablet in The Evening 8)  Ferrex 150 150 Mg Caps (Polysaccharide Iron Complex) .... Once Daily 9)  Cozaar 50  Mg Tabs (Losartan Potassium) .... One Daily 10)  Alprazolam 0.5 Mg Tabs (Alprazolam) .... Take One Tablet At Bedtime For Sleep 11)  Hydralazine Hcl 25 Mg Tabs (Hydralazine Hcl) .... One and One-Half   Tablets  Three Times A Day 12)  Isosorbide Mononitrate Cr 60 Mg Xr24h-Tab (Isosorbide Mononitrate) .... One Tablet Daily 13)  Promethazine Hcl 12.5 Mg Tabs (Promethazine Hcl) .... One Every 6 Hours As Needed For Nausea 14)  Spironolactone 25 Mg Tabs (Spironolactone) .Marland Kitchen.. 1po Daily 15)   Vitamin D (Ergocalciferol) 50000 Unit Caps (Ergocalciferol) .Marland Kitchen.. 1 By Mouth Weekly 16)  Respiridone 4mg  .... Take One Tablet Once Daily  Allergies (verified): 1)  ! * Colchicine  Past History:  Past medical, surgical, family and social histories (including risk factors) reviewed, and no changes noted (except as noted below).  Past Medical History:  1. Congestive heart failure, most likely secondary to severe mitral regurgitation.   TEE (6/10): EF 40%, diffuse hypokinesis,  mild to moderately dilated LV, severe eccentric MR, small PFO.  Cardiac MRI (6/10): EF 37% with global hypokinesis, moderate to severe MR, no delayed enhancement (no evidence for sarcoidosis or other infiltrative disease).  TTE (10/10) after MV repair: EF 25-30%, moderately dilated LV, moderate diastolic dysfunction, mildly depressed RV function, trivial MR, MV mean gradient 5 mmHg, PASP 30 mmHg.  RHC (12/10): mean RA 19, PA 46/26, mean PCWP 28, CI 2.5, SVO2 63%.  TTE (2/11): EF 35-40% with diffuse hypokinesis, no significant MR or MS.  TTE (7/11): EF 45%, mild global hypokinesis, s/p MV repair, no mitral regurgitation, minimal mitral stenosis, PA systolic pressure 40 mmHg, mild LV dilation.   2. Severe mitral regurgitation (see above).  Minimally invasive mitral valve repair on 12/18/08.  She additionally had TV repair and PFO closure.   3. Microcytic anemia, secondary to chronic gastric blood loss.   4. Gastric ulcers.   5. Morbid obesity.   6. Obstructive sleep apnea, on CPAP.   7. GERD.   8. Hypertension, controlled.   9. Rheumatoid arthritis  10. Gout.   11. Depression.   12. LHC (6/10): No angiographic CAD.  Mildly dilated LV. 3+ MR.  EF 40%.   13. Prior smoking, quit   14. Colitis (2/11)  15. Auditory hallucinations, newly on Risperdal  Family History: Reviewed history from 11/24/2008 and no changes required. Negative for cardiac or renal disease.   Social History: Reviewed history from 09/22/2009 and no  changes required. Single, 3 children.  Unemployed.  Tobacco Use - Yes.  Smoked < 1 ppd, quit 6/10.  Alcohol Use - no Regular Exercise - no Drug Use - no, hx crack-cocaine use  Review of Systems       All systems reviewed and negative except as per HPI.   Vital Signs:  Patient profile:   50 year old female Height:      67 inches Weight:      326 pounds BMI:     51.24 Pulse rate:   88 / minute Pulse rhythm:   regular BP sitting:   110 / 62  (left arm) Cuff size:   large  Vitals Entered By: Judithe Modest CMA (January 28, 2010 10:19 AM)  Physical Exam  General:  Well developed, well nourished, in no acute distress. Obese.  Neck:  Neck supple, JVP 10 cm. No masses, thyromegaly or abnormal cervical nodes. Lungs:  Slight bibasilar crackles.  Heart:  Lateral PMI, chest non-tender; regular rate and rhythm, S1, S2 with no murmur noted.  No S3. Carotid upstroke normal, no bruit. Pedals normal pulses. 1+ ankle edema.  Abdomen:  Bowel sounds positive; abdomen soft and non-tender without masses, organomegaly, or hernias noted. No hepatosplenomegaly. Extremities:  No clubbing or cyanosis. Neurologic:  Oriented to person and place.  No focal deficits.  Somewhat cognitively delayed when attempting to answer questions. Psych:  Normal affect.   Impression & Recommendations:  Problem # 1:  CHRONIC SYSTOLIC HEART FAILURE (ICD-428.22) Assessment Deteriorated Patient having worsening symptoms since last visit.  Increased shortness of breath, increased weight gain almost 10 lbs since last visit in July. Hepatojugular reflex with increased JVP on exam today.  Plan to stop Lasix and start Torsemide 80 mg by mouth daily for better absorption today to help diurese patient better.  Continue other cardiac medications.  Also plan to obtain blood work:  BMET and BNP.  I will see her back in 1 week.   Problem # 2:  MITRAL VALVE DISORDERS (ICD-424.0) Most recent Echo no significant MR or MS.  Will check  BNP today.    Problem # 3:  CONFUSION Patient is mildly drowsy and confused.  This seems to be new since starting Risperdal for hallucinations. I suspect this is due to the medication, which was actually increased yesterday.  She is still having some auditory hallucinations.  I will check BMET, LFTs and ammonia level as well as CBC to make sure that there are no significant lab abnormalities that could contribute.  She is going to followup for this with her PCP and behavioral health.   Patient Instructions: 1)  Your physician has recommended you make the following change in your medication:  2)  Stop Lasix(furosemide). 3)  Start Torsemide 80mg  daily--this will be four 20mg  torsemide tablets. 4)  Your physician recommends that you schedule a follow-up appointment in: 1 week with Dr Shirlee Latch.  5)  Lab in 1 week when you see Dr Christie Beckers 098.11 Prescriptions: TORSEMIDE 20 MG TABS (TORSEMIDE) four tablets daily  #120 x 6   Entered by:   Katina Dung, RN, BSN   Authorized by:   [name], MD   Signed by:   Katina Dung, RN, BSN on 01/28/2010   Method used:   Electronically to        [facility] [phone] (retail)       [address]       [place], [place]  [address]       Ph: [phone]       Fax: [phone]   RxID:   [id number]    Prevention & Chronic Care Immunizations   Influenza vaccine: Not documented    Tetanus booster: Not documented    Pneumococcal vaccine: Not documented  Other Screening   Pap smear: Not documented    Mammogram: Not documented   Smoking status: current  (11/24/2008)  Lipids   Total Cholesterol: 154  (10/18/2007)   LDL: 97  (10/18/2007)   LDL Direct: Not documented   HDL: 44  (10/18/2007)   Triglycerides: 63  (10/18/2007)

## 2010-06-15 NOTE — Assessment & Plan Note (Signed)
Summary: per check out/sf  Medications Added ALLOPURINOL 300 MG TABS (ALLOPURINOL) once daily      Allergies Added:   Visit Type:  Follow-up Referring Provider:  [name] Primary Provider:  Dow Adolph, MD  CC:  shortness of breath and CP.  History of Present Illness: 50 yo with systolic CHF probably secondary to severe MR now s/p MV repair, rheumatoid arthritis, and PUD presents for followup.  I am following her closely due to difficult to manage volume overload in the setting of some renal dysfunction and confusion regarding her medical regimen.  She has actually been doing well since I last saw her.  Weight is stable.  Creatinine is essentially stable.  She continues to be mainly limited by her knee pain but is able to walk around her house without dyspnea.  She is not very active.  Mild orthopnea (chronic).    Labs (12/23/09) K 4.4, creatinine 1.1, BNP 145 Labs (9/11): K 3.9, creatinine 1.0, BNP 84, LFTs normal, HCT 29.3 Labs (10/11): K 4.0, creatinine 1.55 => 1.2, uric acid 13, TSH normal, BNP 75 => 172 Labs (11/11): BNP 110 => 105, creatinine 3.6 => 1.2 => 1.5, K 3.7  ECG: NSR, PACs   Current Medications (verified): 1)  Percocet 5-325 Mg Tabs (Oxycodone-Acetaminophen) .... Uad 2)  Aspirin 81 Mg Tabs (Aspirin) .... Once Daily 3)  Citalopram Hydrobromide 40 Mg Tabs (Citalopram Hydrobromide) .... Take One Tablet By Mouth Once Daily 4)  Ventolin Hfa 108 (90 Base) Mcg/act Aers (Albuterol Sulfate) .Marland Kitchen.. 1 Puff As Needed 5)  Carvedilol 6.25 Mg Tabs (Carvedilol) .... Two Times A Day 6)  Ferrex 150 150 Mg Caps (Polysaccharide Iron Complex) .... Once Daily 7)  Alprazolam 0.5 Mg Tabs (Alprazolam) .... Take One Tablet At Bedtime For Sleep 8)  Hydralazine Hcl 25 Mg Tabs (Hydralazine Hcl) .... One and One-Half   Tablets  Three Times A Day 9)  Promethazine Hcl 12.5 Mg Tabs (Promethazine Hcl) .... One Every 6 Hours As Needed For Nausea 10)  Vitamin D (Ergocalciferol) 50000 Unit  Caps (Ergocalciferol) .Marland Kitchen.. 1 By Mouth Weekly 11)  Respiridone 4mg  .... Take One Tablet Once Daily 12)  Torsemide 20 Mg Tabs (Torsemide) .... Four Tablets Daily 13)  Zaroxolyn 2.5 Mg Tabs (Metolazone) .... One Every Friday--Take This 30 Minutes Before You Take Torsemide 14)  Klor-Con M20 20 Meq Cr-Tabs (Potassium Chloride Crys Cr) .... One Daily--Take Two On The Days That You Take Metolazone 15)  Isosorbide Mononitrate Cr 60 Mg Xr24h-Tab (Isosorbide Mononitrate) .... One Daily 16)  Nexium 40 Mg Cpdr (Esomeprazole Magnesium) .Marland Kitchen.. 1 Tab Once Daily 17)  Spironolactone 25 Mg Tabs (Spironolactone) .... One-Half Daily 18)  Allopurinol 300 Mg Tabs (Allopurinol) .... Once Daily  Allergies (verified): 1)  ! * Colchicine  Past History:  Past Medical History: Reviewed history from 01/28/2010 and no changes required.  1. Congestive heart failure, most likely secondary to severe mitral regurgitation.   TEE (6/10): EF 40%, diffuse hypokinesis,  mild to moderately dilated LV, severe eccentric MR, small PFO.  Cardiac MRI (6/10): EF 37% with global hypokinesis, moderate to severe MR, no delayed enhancement (no evidence for sarcoidosis or other infiltrative disease).  TTE (10/10) after MV repair: EF 25-30%, moderately dilated LV, moderate diastolic dysfunction, mildly depressed RV function, trivial MR, MV mean gradient 5 mmHg, PASP 30 mmHg.  RHC (12/10): mean RA 19, PA 46/26, mean PCWP 28, CI 2.5, SVO2 63%.  TTE (2/11): EF 35-40% with diffuse hypokinesis, no significant MR or MS.  TTE (7/11): EF 45%, mild global hypokinesis, s/p MV repair, no mitral regurgitation, minimal mitral stenosis, PA systolic pressure 40 mmHg, mild LV dilation.   2. Severe mitral regurgitation (see above).  Minimally invasive mitral valve repair on 12/18/08.  She additionally had TV repair and PFO closure.   3. Microcytic anemia, secondary to chronic gastric blood loss.   4. Gastric ulcers.   5. Morbid obesity.   6. Obstructive sleep apnea,  on CPAP.   7. GERD.   8. Hypertension, controlled.   9. Rheumatoid arthritis  10. Gout.   11. Depression.   12. LHC (6/10): No angiographic CAD.  Mildly dilated LV. 3+ MR.  EF 40%.   13. Prior smoking, quit   14. Colitis (2/11)  15. Auditory hallucinations, newly on Risperdal  Family History: Reviewed history from 11/24/2008 and no changes required. Negative for cardiac or renal disease.   Social History: Reviewed history from 09/22/2009 and no changes required. Single, 3 children.  Unemployed.  Tobacco Use - Yes.  Smoked < 1 ppd, quit 6/10.  Alcohol Use - no Regular Exercise - no Drug Use - no, hx crack-cocaine use  Vital Signs:  Patient profile:   50 year old female Height:      67 inches Weight:      335.50 pounds BMI:     52.74 Pulse rate:   84 / minute BP sitting:   122 / 78  (left arm) Cuff size:   regular  Vitals Entered By: Caralee Ates CMA (April 15, 2010 12:08 PM)  Nutrition Counseling: Patient's BMI is greater than 25 and therefore counseled on weight management options.  Physical Exam  General:  Well developed, well nourished, in no acute distress. Obese.  Neck:  Neck supple, JVP 8-9 cm. No masses, thyromegaly or abnormal cervical nodes. Lungs:  Very slight bibasilar crackles.  Heart:  Lateral PMI, chest non-tender; regular rate and rhythm, S1, S2 with no murmur noted.  No S3. Carotid upstroke normal, no bruit. Pedals normal pulses. No edema.  Abdomen:  Bowel sounds positive; abdomen soft and non-tender without masses, organomegaly, or hernias noted. No hepatosplenomegaly. Extremities:  No clubbing or cyanosis. Neurologic:  Alert and oriented x 3. Psych:  Normal affect.   Impression & Recommendations:  Problem # 1:  CHRONIC SYSTOLIC HEART FAILURE (ICD-428.22) Continues to have NYHA class III symptoms but stable.  Mildy volume overloaded.  Creatinine is slightly elevated but no significantly different from last check.  She is taking all of her meds as  ordered.  Overall she is doing well compared to prior appointments.  I am going to have her continue current torsemide as well as metolozone once a week.  Continue Coreg, hydralazine, and Imdur.  Will hold off on ARB for now until we see if creatinine will stay stable.  BMET and BNP in 1 month, followup in 2 months.   Problem # 2:  MITRAL VALVE DISORDERS (ICD-424.0) Most recent Echo no significant MR or MS.    Patient Instructions: 1)  Your physician recommends that you return for lab work in: 1 month--BMP/BNP 428.22 2)  Your physician recommends that you schedule a follow-up appointment in: 2 months with Dr Shirlee Latch.

## 2010-06-15 NOTE — Letter (Signed)
Summary: Home Health Cert & Plan of Care  Home Health Cert & Plan of Care   Imported By: Marylou Mccoy 01/05/2010 16:36:03  _____________________________________________________________________  External Attachment:    Type:   Image     Comment:   External Document

## 2010-06-15 NOTE — Assessment & Plan Note (Signed)
Summary: per check out/sf    Referring Provider:  [name] Primary Provider:  Dow Adolph, MD   History of Present Illness: 50 yo with systolic CHF probably secondary to severe MR now s/p MV repair, rheumatoid arthritis, and PUD presents for followup.  She has a number of problems today.  Her main complaint is nausea and vomiting.  She has had this after most meals (1-2 times a day) for the last 2 weeks.  She additionally gets right upper quadrant pain.  She has been able to keep down her pills and has been taking all her meds.  She denies fever. She also has had worsening of her chronic back pain for the last 3 weeks and has been using a walker because of back and knee pain.  Her exertional shortness of breath is stable.  She is able to walk about 50 feet then gets short of breath and has limiting back and knee pain.  She also has been coughing recently and congested.  Her weight is actually down 8 lbs.    Labs (1/11): BNP 92, K 4.7, creatinine 1.4  Problems Prior to Update: 1)  Chronic Systolic Heart Failure  (ICD-428.22) 2)  Dyspnea  (ICD-786.05) 3)  Graves' Disease  (ICD-242.00) 4)  Mitral Valve Disorders  (ICD-424.0) 5)  CHF  (ICD-428.0) 6)  Mitral Regurgitation, Severe  (ICD-396.3) 7)  Arthritis, Generalized (SEE COMMENTS)  (ICD-716.99) 8)  Tear, Bucket, Lateral Meniscus  (ICD-717.41) 9)  Backache Nos  (ICD-724.5) 10)  Obesity Nos  (ICD-278.00) 11)  Symptom, Apnea, Sleep Nos  (ICD-780.57)  Current Medications (verified): 1)  Vicodin 5-500 Mg  Tabs (Hydrocodone-Acetaminophen) .... #60 Monthly, One To Two By Mouth Q 4 To 6 Hrs Prn 2)  Aspirin 81 Mg Tabs (Aspirin) .... Once Daily 3)  Citalopram Hydrobromide 40 Mg Tabs (Citalopram Hydrobromide) .... Take One Tablet By Mouth Once Daily 4)  Protonix 40 Mg Pack (Pantoprazole Sodium) .... Once Daily 5)  Ventolin Hfa 108 (90 Base) Mcg/act Aers (Albuterol Sulfate) .Marland Kitchen.. 1 Puff As Needed 6)  Carvedilol 6.25 Mg Tabs (Carvedilol)  .... Two Times A Day 7)  Lasix 40 Mg Tabs (Furosemide) .... 2 By Mouth Two Times A Day 8)  Klor-Con M20 20 Meq Cr-Tabs (Potassium Chloride Crys Cr) .Marland Kitchen.. 1 Tab By Mouth Once Daily 9)  Ferrex 150 150 Mg Caps (Polysaccharide Iron Complex) .... Once Daily 10)  Lisinopril 20 Mg Tabs (Lisinopril) .Marland Kitchen.. 1 By Mouth Two Times A Day 11)  Alprazolam 0.5 Mg Tabs (Alprazolam) .... Take One Tablet At Bedtime For Sleep 12)  Spironolactone 25 Mg Tabs (Spironolactone) .Marland Kitchen.. 1 Daily 13)  Glucosamine-Chondroitin   Caps (Glucosamine-Chondroit-Vit C-Mn) .... As Directed 14)  Metolazone 2.5 Mg Tabs (Metolazone) .... One Tablet On Wed and Saturday 15)  Hydralazine Hcl 25 Mg Tabs (Hydralazine Hcl) .... One-Half Tablet Three Times A Day 16)  Imdur 30 Mg Xr24h-Tab (Isosorbide Mononitrate) .... One Tablet Daily  Allergies: 1)  ! * Colchicine  Past History:  Past Medical History:  1. Congestive heart failure, most likely secondary to severe mitral regurgitation.   TEE (6/10): EF 40%, diffuse hypokinesis,  mild to moderately dilated LV, severe eccentric MR, small PFO.  Cardiac MRI (6/10): EF 37% with global hypokinesis, moderate to severe MR, no delayed enhancement (no evidence for sarcoidosis or other infiltrative disease).  TTE (10/10) after MV repair: EF 25-30%, moderately dilated LV, moderate diastolic dysfunction, mildly depressed RV function, trivial MR, MV mean gradient 5 mmHg, PASP 30 mmHg.  RHC (12/10): mean RA 19, PA 46/26, mean PCWP 28, CI 2.5, SVO2 63%.  TTE (2/11): EF 35-40% with diffuse hypokinesis, no significant MR or MS.   2. Severe mitral regurgitation (see above).  Minimally invasive mitral valve repair on 12/18/08.  She additionally had TV repair and PFO closure.   3. Microcytic anemia, secondary to chronic gastric blood loss.   4. Gastric ulcers.   5. Morbid obesity.   6. Obstructive sleep apnea, on CPAP.   7. GERD.   8. Hypertension, controlled.   9. Rheumatoid arthritis  10. Gout.   11. Depression.    12. LHC (6/10): No angiographic CAD.  Mildly dilated LV. 3+ MR.  EF 40%.   13. Prior smoking, quit 6/10.   Family History: Reviewed history from 11/24/2008 and no changes required. Negative for cardiac or renal disease.   Social History: Reviewed history from 04/07/2009 and no changes required. Single, 3 children.  Works at [employer].  Tobacco Use - Yes.  Smoked < 1 ppd, quit 6/10.  Alcohol Use - no Regular Exercise - no Drug Use - no, hx crack-cocaine use  Review of Systems       All systems reviewed and negative except as per HPI.   Vital Signs:  Patient profile:   50 year old female Height:      67 inches Weight:      301 pounds BMI:     47.31 O2 Sat:      100 % Pulse rate:   82 / minute BP sitting:   93 / 56  (left arm) Cuff size:   large  Vitals Entered By: Oswald Hillock (June 24, 2009 4:22 PM)  Physical Exam  General:  Well developed, well nourished, in no acute distress. Obese.  Neck:  Neck supple, JVP does not appear significantly elevated. No masses, thyromegaly or abnormal cervical nodes. Lungs:  Distant breath sounds, no crackles.  Heart:  Lateral PMI, chest non-tender; regular rate and rhythm, S1, S2 with no murmur noted.  No S3. Carotid upstroke normal, no bruit. Pedals normal pulses. No edema.  Abdomen:  Bowel sounds positive; abdomen soft without masses, organomegaly, or hernias noted. No hepatosplenomegaly. Obese.  There is right upper quadrant tenderness without rebound or guarding.  Extremities:  No clubbing or cyanosis. Neurologic:  Alert and oriented x 3. Psych:  Normal affect.   Impression & Recommendations:  Problem # 1:  ABDOMINAL PAIN RIGHT UPPER QUADRANT (ICD-789.01) Nausea/vomiting with meals and right upper quadrant pain.  She does not appear volume overloaded on exam (though exam is difficult given body habitus) and weight is down 8 lbs, so think less likely to be hepatic congestion.  I worry about cholelithiasis or  PUD in her (has history of PUD).  Continue PPI.  Will get right upper quadrant ultrasound.  CMET, amylase, lipase today. GI referral.   Problem # 2:  CHRONIC SYSTOLIC HEART FAILURE (ICD-428.22) 8 lb weight loss and stable symptoms. Her volume status is difficult to discern clinically but she does not appear volume overloaded.  EF 35-40% on last echo so will hold off on ICD at this point.  Continue current meds at current doses.  Will plan on increasing Coreg when I see her again.   Problem # 3:  MITRAL VALVE DISORDERS (ICD-424.0) No significant MR or MS s/p MV repair on most recent echo.   Other Orders: TLB-BMP (Basic Metabolic Panel-BMET) (80048-METABOL) TLB-Hepatic/Liver Function Pnl (80076-HEPATIC) TLB-Amylase (82150-AMYL) TLB-Lipase (83690-LIPASE) Misc. Referral (Misc.  Ref) Gastroenterology Referral (GI)  Patient Instructions: 1)  Your physician recommends that you have lab work today--BMP/Liver/Amalyse/Lipase 2)  Ultrasound of right  upper quadrant 3)  You have been referred to GI for nausea and vomiting/right upper quadrant pain/?cholecystitis 4)  Your physician recommends that you continue on your current medications as directed. Please refer to the Current Medication list given to you today. 5)  Your physician recommends that you schedule a follow-up appointment in: 2 months with Dr. [name]       Appended Document: per check out/sf I think she is dehydrated from vomiting.  Hold KCl, hold spironolactone, decrease lisinopril to once a day, hold all diuretics for 3 days then resume Lasix only.  Hold metolozone until follows up.  I will see her next Friday.   Appended Document: per check out/sf discussed with pt--she will make med changes and return for BMP 07-02-09 and see Dr Shirlee Latch 07-13-09   Clinical Lists Changes  Medications: Removed medication of KLOR-CON M20 20 MEQ CR-TABS (POTASSIUM CHLORIDE CRYS CR) 1 tab by mouth once daily Changed medication from LISINOPRIL  20 MG TABS (LISINOPRIL) 1 by mouth two times a day to LISINOPRIL 20 MG TABS (LISINOPRIL) 1 by mouth one time a day Removed medication of SPIRONOLACTONE 25 MG TABS (SPIRONOLACTONE) 1 daily Removed medication of METOLAZONE 2.5 MG TABS (METOLAZONE) ONE TABLET ON WED AND SATURDAY Changed medication from LASIX 40 MG TABS (FUROSEMIDE) 2 by mouth two times a day to * LASIX 40 MG TABS (FUROSEMIDE)-HOLD FOR 3 DAYS 06-25-09 2 by mouth two times a day Observations: Added new observation of MEDRECON: current updated (06/25/2009 17:29)       Current Medications (verified): 1)  Vicodin 5-500 Mg  Tabs (Hydrocodone-Acetaminophen) .... #60 Monthly, One To Two By Mouth Q 4 To 6 Hrs Prn 2)  Aspirin 81 Mg Tabs (Aspirin) .... Once Daily 3)  Citalopram Hydrobromide 40 Mg Tabs (Citalopram Hydrobromide) .... Take One Tablet By Mouth Once Daily 4)  Protonix 40 Mg Pack (Pantoprazole Sodium) .... Once Daily 5)  Ventolin Hfa 108 (90 Base) Mcg/act Aers (Albuterol Sulfate) .Marland Kitchen.. 1 Puff As Needed 6)  Carvedilol 6.25 Mg Tabs (Carvedilol) .... Two Times A Day 7)  Lasix 40 Mg Tabs (Furosemide)-Hold For 3 Days 06-25-09 .... 2 By Mouth Two Times A Day 8)  Ferrex 150 150 Mg Caps (Polysaccharide Iron Complex) .... Once Daily 9)  Lisinopril 20 Mg Tabs (Lisinopril) .Marland Kitchen.. 1 By Mouth One Time A Day 10)  Alprazolam 0.5 Mg Tabs (Alprazolam) .... Take One Tablet At Bedtime For Sleep 11)  Glucosamine-Chondroitin   Caps (Glucosamine-Chondroit-Vit C-Mn) .... As Directed 12)  Hydralazine Hcl 25 Mg Tabs (Hydralazine Hcl) .... One-Half Tablet Three Times A Day 13)  Imdur 30 Mg Xr24h-Tab (Isosorbide Mononitrate) .... One Tablet Daily  Allergies: 1)  ! * Colchicine

## 2010-06-15 NOTE — Progress Notes (Signed)
Summary: c.o sob, swelling in feet   Phone Note Call from Patient Call back at Home Phone [phone]   Caller: Patient Reason for Call: Talk to Nurse Complaint: Breathing Problems Details of Complaint:  c/o swelling in feet.  Initial call taken by: Lorne Skeens,  March 30, 2010 3:53 PM  Follow-up for Phone Call        I talked with pt--pt states she has been more SOB since yesterday--she cannot lay down due to SOB , she has increased edema in her feet and ankes,she denies weight gain ,she states she weighed the same today as she weighed yesterday--reviewed with Dr De Blanch) --he recommended pt take Metalozone 2.5mg  now and take Torsemide 40mg  thirty minutes later--pt to take Torsemide 80mg  daily until OV with Dr Shirlee Latch 04/01/10  repeat BMP at time of OV with Dr Shirlee Latch 04/01/10--I talked with pt and she verbalized understanding

## 2010-06-15 NOTE — Letter (Signed)
Summary: [facility] Instructions  [facility]  [address] [place], [place] [address]   Phone: [phone]  Fax: [phone]       [name]    July 21, 1946    MRN: [id number]        Procedure Day Dorna Bloom: [facility] 05/19/2010     Arrival Time:3PM     Procedure Time:4PM     Location of Procedure:                    X   [facility] (4th Floor)   PREPARATION FOR COLONOSCOPY WITH MOVIPREP   Starting 5 days prior to your procedure12/31/2011 do not eat nuts, seeds, popcorn, corn, beans, peas,  salads, or any raw vegetables.  Do not take any fiber supplements (e.g. Metamucil, Citrucel, and Benefiber).  THE DAY BEFORE YOUR PROCEDURE         DATE: 05/18/2010  DAY: TUESDAY  1.  Drink clear liquids the entire day-NO SOLID FOOD  2.  Do not drink anything colored red or purple.  Avoid juices with pulp.  No orange juice.  3.  Drink at least 64 oz. (8 glasses) of fluid/clear liquids during the day to prevent dehydration and help the prep work efficiently.  CLEAR LIQUIDS INCLUDE: Water Jello Ice Popsicles Tea (sugar ok, no milk/cream) Powdered fruit flavored drinks Coffee (sugar ok, no milk/cream) Gatorade Juice: apple, white grape, white cranberry  Lemonade Clear bullion, consomm, broth Carbonated beverages (any kind) Strained chicken noodle soup Hard Candy                             4.  In the morning, mix first dose of MoviPrep solution:    Empty 1 Pouch A and 1 Pouch B into the disposable container    Add lukewarm drinking water to the top line of the container. Mix to dissolve    Refrigerate (mixed solution should be used within 24 hrs)  5.  Begin drinking the prep at 5:00 p.m. The MoviPrep container is divided by 4 marks.   Every 15 minutes drink the solution down to the next mark (approximately 8 oz) until the full liter is complete.   6.  Follow completed prep with 16 oz of clear liquid of your choice (Nothing red or purple).   Continue to drink clear liquids until bedtime.  7.  Before going to bed, mix second dose of MoviPrep solution:    Empty 1 Pouch A and 1 Pouch B into the disposable container    Add lukewarm drinking water to the top line of the container. Mix to dissolve    Refrigerate  THE DAY OF YOUR PROCEDURE      DATE: 05/19/2010 DAY:WED  Beginning at 11a.m. (5 hours before procedure):         1. Every 15 minutes, drink the solution down to the next mark (approx 8 oz) until the full liter is complete.  2. Follow completed prep with 16 oz. of clear liquid of your choice.    3. You may drink clear liquids until 2PM (2 HOURS BEFORE PROCEDURE).   MEDICATION INSTRUCTIONS  Unless otherwise instructed, you should take regular prescription medications with a small sip of water   as early as possible the morning of your procedure.         OTHER INSTRUCTIONS  You will need a responsible adult at least 49 years of age to accompany you and  drive you home.   This person must remain in the waiting room during your procedure.  Wear loose fitting clothing that is easily removed.  Leave jewelry and other valuables at home.  However, you may wish to bring a book to read or  an iPod/MP3 player to listen to music as you wait for your procedure to start.  Remove all body piercing jewelry and leave at home.  Total time from sign-in until discharge is approximately 2-3 hours.  You should go home directly after your procedure and rest.  You can resume normal activities the  day after your procedure.  The day of your procedure you should not:   Drive   Make legal decisions   Operate machinery   Drink alcohol   Return to work  You will receive specific instructions about eating, activities and medications before you leave.    The above instructions have been reviewed and explained to me by   _______________________    I fully understand and can verbalize these instructions  _____________________________ Date _________

## 2010-06-15 NOTE — Progress Notes (Signed)
Summary: retaining fluid   Phone Note Call from Patient   Caller: Patient Reason for Call: Talk to Nurse Summary of Call: pt retaining fluid x 3days-gaining about 2lbs a night, taking lasix -pls advise [phone] Initial call taken by: Glynda Jaeger,  December 16, 2009 1:33 PM  Follow-up for Phone Call        Returning pt's call. Pt states since she started taken 80 mg of lasix in am and 40 mg in Pm she has been retaining fluids. Before she was taken 80mg  of lasix twice a day without problems. Now she has gain 2 lbs a day for the last three days, and having some cough. Pt denies any  other symptoms. On her last visit on 12/08/09  her wt. was 316 lbs. Today's wt. is 319 lbs. Pt. states she stared back to take the 80 mg twice a day yesterday evening. I adviced pt. to continue taken the 80 mg twice a day. Call the office back in a few days if that does not work. Pt. verbalized understanding.  Follow-up by: Ollen Gross, RN, BSN,  December 16, 2009 2:23 PM     Appended Document: retaining fluid Agree with keeping Lasix at 80 mg two times a day.  Get BMET/BNP next week and followup with me.   Appended Document: retaining fluid I called and spoke with the pt. She will come around 8/10 for repeat labs.

## 2010-06-15 NOTE — Letter (Signed)
Summary: [facility]: Immunoassay Fecal Occult Blood (iFOB) Order [facility]  [address] [place], [place] [address]   Phone: [phone]  Fax: [phone]      [facility]: Immunoassay Fecal Occult Blood (iFOB) Order Form   April 16, 2010 MRN: [id number]   [name] [birth year]   Physicican Name:robert kaplan Diagnosis Code:280.9 anemia     [name] CMA (AAMA)

## 2010-06-15 NOTE — Assessment & Plan Note (Signed)
Summary: per check out/ok per anne/saf  Medications Added ZAROXOLYN 2.5 MG TABS (METOLAZONE) one every Friday--TAKE THIS 30 MINUTES BEFORE YOU TAKE TORSEMIDE. KLOR-CON M20 20 MEQ CR-TABS (POTASSIUM CHLORIDE CRYS CR) one daily--take two on the days that you take Metolazone        Referring Provider:  Marca Lyons Primary Provider:  Dow Adolph, MD  CC:  folow up.  History of Present Illness: 50 yo with systolic CHF probably secondary to severe MR now s/p MV repair, rheumatoid arthritis, and PUD presents for followup.  When I last saw [name], she had been discharged recently from behavioral health for homicidal ideation and hallucinations.  She was on risperdal and was somewhat somnolent and confused.  Today she is doing better.  She seems clear.  She thinks she is getting used to the risperdal. She still "thinks about death" a lot but is not having any further auditory hallucinations.    Main problem continues to be severe back pain.  This limits her walking and she is using a walker.  She has been taking torsemide 80 mg daily for about 3 days (was not taking it correctly prior to that).  Her weight is up 7 lbs.  She has mild shortness of breath walking short distances (as she is very limited by her back).    Patient has long-standing significant iron deficieny anemia.  She has periods but says that they are not heavy.  She has some lower abdominal cramping.  She is going to see GI.    Labs (5/11): K 3.9, creatinine 0.9, BNP 124 Labs (12/01/09): K 4.8, creatinine 1.6, HCT 27.8 Labs (12/08/09) K 4.1, creatinine 1.1, BNP 73 Labs (12/23/09) K 4.4, creatinine 1.1, BNP 145 Labs (9/11): K 3.9, creatinine 1.0, BNP 84, LFTs normal, HCT 29.3   Current Medications (verified): 1)  Vicodin 5-500 Mg  Tabs (Hydrocodone-Acetaminophen) .... #60 Monthly, One To Two By Mouth Q 4 To 6 Hrs Prn 2)  Aspirin 81 Mg Tabs (Aspirin) .... Once Daily 3)  Citalopram Hydrobromide 40 Mg Tabs (Citalopram  Hydrobromide) .... Take One Tablet By Mouth Once Daily 4)  Omeprazole 40 Mg Cpdr (Omeprazole) .... One Daily 5)  Ventolin Hfa 108 (90 Base) Mcg/act Aers (Albuterol Sulfate) .Marland Kitchen.. 1 Puff As Needed 6)  Carvedilol 6.25 Mg Tabs (Carvedilol) .... Two Times A Day 7)  Ferrex 150 150 Mg Caps (Polysaccharide Iron Complex) .... Once Daily 8)  Cozaar 50 Mg Tabs (Losartan Potassium) .... One Daily 9)  Alprazolam 0.5 Mg Tabs (Alprazolam) .... Take One Tablet At Bedtime For Sleep 10)  Hydralazine Hcl 25 Mg Tabs (Hydralazine Hcl) .... One and One-Half   Tablets  Three Times A Day 11)  Isosorbide Mononitrate Cr 60 Mg Xr24h-Tab (Isosorbide Mononitrate) .... One Tablet Daily 12)  Promethazine Hcl 12.5 Mg Tabs (Promethazine Hcl) .... One Every 6 Hours As Needed For Nausea 13)  Spironolactone 25 Mg Tabs (Spironolactone) .Marland Kitchen.. 1po Daily 14)  Vitamin D (Ergocalciferol) 50000 Unit Caps (Ergocalciferol) .Marland Kitchen.. 1 By Mouth Weekly 15)  Respiridone 4mg  .... Take One Tablet Once Daily 16)  Torsemide 20 Mg Tabs (Torsemide) .... Four Tablets Daily  Allergies: 1)  ! * Colchicine  Past History:  Past Medical History: Reviewed history from 01/28/2010 and no changes required.  1. Congestive heart failure, most likely secondary to severe mitral regurgitation.   TEE (6/10): EF 40%, diffuse hypokinesis,  mild to moderately dilated LV, severe eccentric MR, small PFO.  Cardiac MRI (6/10): EF 37% with global hypokinesis,  moderate to severe MR, no delayed enhancement (no evidence for sarcoidosis or other infiltrative disease).  TTE (10/10) after MV repair: EF 25-30%, moderately dilated LV, moderate diastolic dysfunction, mildly depressed RV function, trivial MR, MV mean gradient 5 mmHg, PASP 30 mmHg.  RHC (12/10): mean RA 19, PA 46/26, mean PCWP 28, CI 2.5, SVO2 63%.  TTE (2/11): EF 35-40% with diffuse hypokinesis, no significant MR or Tasha.  TTE (7/11): EF 45%, mild global hypokinesis, s/p MV repair, no mitral regurgitation, minimal mitral  stenosis, PA systolic pressure 40 mmHg, mild LV dilation.   2. Severe mitral regurgitation (see above).  Minimally invasive mitral valve repair on 12/18/08.  She additionally had TV repair and PFO closure.   3. Microcytic anemia, secondary to chronic gastric blood loss.   4. Gastric ulcers.   5. Morbid obesity.   6. Obstructive sleep apnea, on CPAP.   7. GERD.   8. Hypertension, controlled.   9. Rheumatoid arthritis  10. Gout.   11. Depression.   12. LHC (6/10): No angiographic CAD.  Mildly dilated LV. 3+ MR.  EF 40%.   13. Prior smoking, quit   14. Colitis (2/11)  15. Auditory hallucinations, newly on Risperdal  Family History: Reviewed history from 11/24/2008 and no changes required. Negative for cardiac or renal disease.   Social History: Reviewed history from 09/22/2009 and no changes required. Single, 3 children.  Unemployed.  Tobacco Use - Yes.  Smoked < 1 ppd, quit 6/10.  Alcohol Use - no Regular Exercise - no Drug Use - no, hx crack-cocaine use  Review of Systems       All systems reviewed and negative except as per HPI.   Vital Signs:  Patient profile:   50 year old female Height:      67 inches Weight:      333 pounds BMI:     52.34 Pulse rate:   86 / minute Resp:     18 per minute BP sitting:   94 / 60  (left arm)  Vitals Entered By: [name] (February 05, 2010 11:08 AM)  Physical Exam  General:  Well developed, well nourished, in no acute distress. Obese.  Neck:  Neck supple, JVP 10+ cm. No masses, thyromegaly or abnormal cervical nodes. Lungs:  Slight bibasilar crackles.  Heart:  Lateral PMI, chest non-tender; regular rate and rhythm, S1, S2 with no murmur noted.  No S3. Carotid upstroke normal, no bruit. Pedals normal pulses. Trace ankle edema.  Abdomen:  Bowel sounds positive; abdomen soft and non-tender without masses, organomegaly, or hernias noted. No hepatosplenomegaly. Extremities:  No clubbing or cyanosis. Neurologic:  Alert and oriented  x 3. Psych:  Normal affect.   Impression & Recommendations:  Problem # 1:  CHRONIC SYSTOLIC HEART FAILURE (ICD-428.22) Mild systolic / significant diastolic CHF.  Last EF 45%.  Patient is volume overloaded with NYHA class III symptoms.  Morbid obesity certainly contributes to her dyspnea and back pain contributes to her lack of mobility.  Weight is up but she has been taking her torsemide correctly for only about 3 days.  She will continue torsemide.  I will add a dose of metolozone 2.5 mg 30 minutes before torsemide once a week.  She will take KCl 40 mEq on metolozone days.  BMET in 3 wks.  Continue current doses of Coreg, losartan, hydralazine, and Imdur.    Problem # 2:  MORBID OBESITY I will arrange to have her evaluated in the bariatric clinic.   Problem #  3:  MITRAL REGURGITATION, SEVERE (ICD-396.3) This was possibly the cause of her cardiomyopathy.  Though cannot rule out nonischemic dilated CMP with secondary MR,  the mitral valve did appear structurally abnormal.  Patient now s/p MV repair with no significant MR on last echo.   Other Orders: Misc. Referral (Misc. Ref) TLB-BNP (B-Natriuretic Peptide) (83880-BNPR) TLB-BMP (Basic Metabolic Panel-BMET) (80048-METABOL)  Patient Instructions: 1)  Your physician has recommended you make the following change in your medication:  2)  Start KCL(potassium) daily. 3)  Take Metolazone  2.5mg  every Friday. TAKE THIS 30 MINUTES BEFORE YOU TAKE TORSEMIDE. TAKE  KCL(POTASSIUM) 40 MEQ(this will be two 20 mEq  tablets)  ON FRIDAY WHEN YOU TAKE METOLAZONE. 4)  Your physician recommends that you have lab today---BNP/BMP428.22 5)  Lab in 3 weeks--BMP/BNP 428.22 6)  Your physician recommends that you schedule a follow-up appointment in: 1 month with Dr Shirlee Latch. 7)  You have been referred to Bariatric Clinic--evaluate for gastric by pass surgery. Prescriptions: KLOR-CON M20 20 MEQ CR-TABS (POTASSIUM CHLORIDE CRYS CR) one daily--take two on the  days that you take Metolazone  #35 x 11   Entered by:   Katina Dung, RN, BSN   Authorized by:   Tasha Ancona, MD   Signed by:   Katina Dung, RN, BSN on 02/05/2010   Method used:   Electronically to        [facility] [phone]* (retail)       [address]       [place], [place]  [address]       Ph: [phone]       Fax: [phone]   RxID:   [id number] ZAROXOLYN 2.5 MG TABS (METOLAZONE) one every Friday--TAKE THIS 30 MINUTES BEFORE YOU TAKE TORSEMIDE.  #5 x 6   Entered by:   Katina Dung, RN, BSN   Authorized by:   Tasha Ancona, MD   Signed by:   Katina Dung, RN, BSN on 02/05/2010   Method used:   Electronically to        [facility] [phone]* (retail)       [address]       [place], [place]  [address]       Ph: [phone]       Fax: [phone]   RxID:   [id number]

## 2010-06-15 NOTE — Assessment & Plan Note (Signed)
Summary: per check out/saf  Medications Added ISOSORBIDE MONONITRATE CR 60 MG XR24H-TAB (ISOSORBIDE MONONITRATE) one daily LOSARTAN POTASSIUM 25 MG TABS (LOSARTAN POTASSIUM) one daily      Allergies Added:   Referring Daneil Beem:  [name] Primary Koston Hennes:  Dow Adolph, MD   History of Present Illness: 50 yo with systolic CHF probably secondary to severe MR now s/p MV repair, rheumatoid arthritis, and PUD presents for followup. After last appointment, patient restarted on her torsemide and once weekly metolozone.  She has lost 18 (? accuracy) pounds since then and is breathing much better.  She still sleeps in a recliner and is very limited exercise-wise by her orthopedic pain.  She walks with a walker.  She is not taking losartan or Imdur.  No chest pain.    Labs (5/11): K 3.9, creatinine 0.9, BNP 124 Labs (12/01/09): K 4.8, creatinine 1.6, HCT 27.8 Labs (12/08/09) K 4.1, creatinine 1.1, BNP 73 Labs (12/23/09) K 4.4, creatinine 1.1, BNP 145 Labs (9/11): K 3.9, creatinine 1.0, BNP 84, LFTs normal, HCT 29.3 Labs (10/11): K 4.0, creatinine 1.55 => 1.2, uric acid 13, TSH normal, BNP 75 => 172   Current Medications (verified): 1)  Vicodin 5-500 Mg  Tabs (Hydrocodone-Acetaminophen) .... #60 Monthly, One To Two By Mouth Q 4 To 6 Hrs Prn 2)  Aspirin 81 Mg Tabs (Aspirin) .... Once Daily 3)  Citalopram Hydrobromide 40 Mg Tabs (Citalopram Hydrobromide) .... Take One Tablet By Mouth Once Daily 4)  Omeprazole 40 Mg Cpdr (Omeprazole) .... One Daily 5)  Ventolin Hfa 108 (90 Base) Mcg/act Aers (Albuterol Sulfate) .Marland Kitchen.. 1 Puff As Needed 6)  Carvedilol 6.25 Mg Tabs (Carvedilol) .... Two Times A Day 7)  Ferrex 150 150 Mg Caps (Polysaccharide Iron Complex) .... Once Daily 8)  Alprazolam 0.5 Mg Tabs (Alprazolam) .... Take One Tablet At Bedtime For Sleep 9)  Hydralazine Hcl 25 Mg Tabs (Hydralazine Hcl) .... One and One-Half   Tablets  Three Times A Day 10)  Promethazine Hcl 12.5 Mg Tabs  (Promethazine Hcl) .... One Every 6 Hours As Needed For Nausea 11)  Spironolactone 25 Mg Tabs (Spironolactone) .Marland Kitchen.. 1po Daily 12)  Vitamin D (Ergocalciferol) 50000 Unit Caps (Ergocalciferol) .Marland Kitchen.. 1 By Mouth Weekly 13)  Respiridone 4mg  .... Take One Tablet Once Daily 14)  Torsemide 20 Mg Tabs (Torsemide) .... Four Tablets Daily 15)  Zaroxolyn 2.5 Mg Tabs (Metolazone) .... One Every Tuesday--Take This 30 Minutes Before You Take Torsemide. 16)  Klor-Con M20 20 Meq Cr-Tabs (Potassium Chloride Crys Cr) .... One Daily--Take Two On The Days That You Take Metolazone  Allergies (verified): 1)  ! * Colchicine  Past History:  Past Medical History: Reviewed history from 01/28/2010 and no changes required.  1. Congestive heart failure, most likely secondary to severe mitral regurgitation.   TEE (6/10): EF 40%, diffuse hypokinesis,  mild to moderately dilated LV, severe eccentric MR, small PFO.  Cardiac MRI (6/10): EF 37% with global hypokinesis, moderate to severe MR, no delayed enhancement (no evidence for sarcoidosis or other infiltrative disease).  TTE (10/10) after MV repair: EF 25-30%, moderately dilated LV, moderate diastolic dysfunction, mildly depressed RV function, trivial MR, MV mean gradient 5 mmHg, PASP 30 mmHg.  RHC (12/10): mean RA 19, PA 46/26, mean PCWP 28, CI 2.5, SVO2 63%.  TTE (2/11): EF 35-40% with diffuse hypokinesis, no significant MR or MS.  TTE (7/11): EF 45%, mild global hypokinesis, s/p MV repair, no mitral regurgitation, minimal mitral stenosis, PA systolic pressure 40 mmHg, mild  LV dilation.   2. Severe mitral regurgitation (see above).  Minimally invasive mitral valve repair on 12/18/08.  She additionally had TV repair and PFO closure.   3. Microcytic anemia, secondary to chronic gastric blood loss.   4. Gastric ulcers.   5. Morbid obesity.   6. Obstructive sleep apnea, on CPAP.   7. GERD.   8. Hypertension, controlled.   9. Rheumatoid arthritis  10. Gout.   11. Depression.    12. LHC (6/10): No angiographic CAD.  Mildly dilated LV. 3+ MR.  EF 40%.   13. Prior smoking, quit   14. Colitis (2/11)  15. Auditory hallucinations, newly on Risperdal  Family History: Reviewed history from 11/24/2008 and no changes required. Negative for cardiac or renal disease.   Social History: Reviewed history from 09/22/2009 and no changes required. Single, 3 children.  Unemployed.  Tobacco Use - Yes.  Smoked < 1 ppd, quit 6/10.  Alcohol Use - no Regular Exercise - no Drug Use - no, hx crack-cocaine use  Review of Systems       All systems reviewed and negative except as per HPI.   Vital Signs:  Patient profile:   50 year old female Height:      67 inches Weight:      329 pounds BMI:     51.71 Pulse rate:   81 / minute Resp:     18 per minute BP sitting:   125 / 89  (right arm)  Vitals Entered By: Marrion Coy, CNA (March 16, 2010 2:29 PM)  Physical Exam  General:  Well developed, well nourished, in no acute distress. Obese.  Neck:  Neck supple, JVP 7-8 cm. No masses, thyromegaly or abnormal cervical nodes. Lungs:  Clear bilaterally to auscultation and percussion. Heart:  Lateral PMI, chest non-tender; regular rate and rhythm, S1, S2 with no murmur noted.  No S3. Carotid upstroke normal, no bruit. Pedals normal pulses. No edema.  Abdomen:  Bowel sounds positive; abdomen soft and non-tender without masses, organomegaly, or hernias noted. No hepatosplenomegaly. Extremities:  No clubbing or cyanosis. Neurologic:  Alert and oriented x 3. Psych:  Normal affect.   Impression & Recommendations:  Problem # 1:  CHRONIC SYSTOLIC HEART FAILURE (ICD-428.22) Patient appears much better volume-wise today.  She is close to euvolemic.  She is less short of breath but difficult to assess her exercise capacity given her orthopedic problems.   - Continue current torsemide and metolozone but need to get BMET/BNP today especially to check creatinine and make sure that we did not  overshoot her diuresis.  - Continue Coreg, hydralazine, spironolactone and restart losartan at 25 mg daily and Imdur at 60 mg daily.  - Followup 2 wks => I am going to keep a close eye on her given difficult to manage volume status.   Problem # 2:  MITRAL VALVE DISORDERS (ICD-424.0) Most recent Echo no significant MR or MS.    Problem # 3:  GOUT Suspect that she would benefit from allopurinol treatment.  Will address this at next appointment.   Other Orders: TLB-BNP (B-Natriuretic Peptide) (83880-BNPR) TLB-BMP (Basic Metabolic Panel-BMET) (80048-METABOL)  Patient Instructions: 1)  Your physician recommends that you schedule a follow-up appointment in: 2 weeks with Dr Shirlee Latch 2)  Your physician recommends that you have lab work today:  BNP and BMP 3)  Your physician has recommended you make the following change in your medication: Start losartan 25 mg once a  day and Isosorbide 60 mg once  a day Prescriptions: LOSARTAN POTASSIUM 25 MG TABS (LOSARTAN POTASSIUM) one daily  #30 x 11   Entered by:   Charolotte Capuchin, RN   Authorized by:   [name], MD   Signed by:   Charolotte Capuchin, RN on 03/16/2010   Method used:   Electronically to        [facility] [phone]* (retail)       [address]       [place], [place]  [address]       Ph: [phone]       Fax: [phone]   RxID:   [id number] ISOSORBIDE MONONITRATE CR 60 MG XR24H-TAB (ISOSORBIDE MONONITRATE) one daily  #30 x 11   Entered by:   Charolotte Capuchin, RN   Authorized by:   [name], MD   Signed by:   Charolotte Capuchin, RN on 03/16/2010   Method used:   Electronically to        [facility] [phone]* (retail)       [address]       [place], [place]  [address]       Ph: [phone]       Fax: [phone]   RxID:   [id number]

## 2010-06-15 NOTE — Letter (Signed)
Summary: New Patient letter  [facility]  [address] [place], [place] [address]   Phone: [phone]  Fax: [phone]       06/26/2009 MRN: [id number]  [facility] [address] [place], [place]  [address]  Dear Ms. Mcclaine,  Welcome to the Gastroenterology Division at [facility].    You are scheduled to see Dr.  Arlyce Dice on 07-01-09 at 8:30Aam on the 3rd floor at [facility], [address].  We ask that you try to arrive at our office 15 minutes prior to your appointment time to allow for check-in.   We would like you to complete the enclosed self-administered evaluation form prior to your visit and bring it with you on the day of your appointment.  We will review it with you.  Also, please bring a complete list of all your medications or, if you prefer, bring the medication bottles and we will list them.  Please bring your insurance card so that we may make a copy of it.  If your insurance requires a referral to see a specialist, please bring your referral form from your primary care physician.  Co-payments are due at the time of your visit and may be paid by cash, check or credit card.     Your office visit will consist of a consult with your physician (includes a physical exam), any laboratory testing he/she may order, scheduling of any necessary diagnostic testing (e.g. x-ray, ultrasound, CT-scan), and scheduling of a procedure (e.g. Endoscopy, Colonoscopy) if required.  Please allow enough time on your schedule to allow for any/all of these possibilities.    If you cannot keep your appointment, please call [phone] to cancel or reschedule prior to your appointment date.  This allows [place] the opportunity to schedule an appointment for another patient in need of care.  If you do not cancel or reschedule by 5 p.m. the business day prior to your appointment date, you will be charged a $50.00 late cancellation/no-show fee.    Thank you for  choosing [facility] for your medical needs.  We appreciate the opportunity to care for you.  Please visit [place] at our website  to learn more about our practice.                     Sincerely,                                                             The Gastroenterology Division

## 2010-06-15 NOTE — Letter (Signed)
Summary: ER Notification  [facility], Main Office  [address]   [place], [place] [address]   Phone: [phone]  Fax: [phone]    March 01, 2010 10:36 AM  [name]  The above referenced patient has been advised to report directly to the Emergency Room. Please see below for more information:  Dx: __edema__________     Private Vehicle  _____X__________ or EMS:  ________________   Orders:  Yes ______ or No  __X_____   Notify upon arrival:     Trish [phone]       Or _________________   Thank you,    [facility] Staff

## 2010-06-15 NOTE — Letter (Signed)
Summary: New Patient letter  [facility]  [address] [place], [place] [address]   Phone: [phone]  Fax: [phone]       03/05/2010 MRN: [id number]  [name] [address] [place], [place]  [address]  Dear [name],  Welcome to the Gastroenterology Division at [facility].    You are scheduled to see Dr.  Arlyce Dice on 04-16-10 at [facility] on the 3rd floor at [facility], [address].  We ask that you try to arrive at our office 15 minutes prior to your appointment time to allow for check-in.  We would like you to complete the enclosed self-administered evaluation form prior to your visit and bring it with you on the day of your appointment.  We will review it with you.  Also, please bring a complete list of all your medications or, if you prefer, bring the medication bottles and we will list them.  Please bring your insurance card so that we may make a copy of it.  If your insurance requires a referral to see a specialist, please bring your referral form from your primary care physician.  Co-payments are due at the time of your visit and may be paid by cash, check or credit card.     Your office visit will consist of a consult with your physician (includes a physical exam), any laboratory testing he/she may order, scheduling of any necessary diagnostic testing (e.g. x-ray, ultrasound, CT-scan), and scheduling of a procedure (e.g. Endoscopy, Colonoscopy) if required.  Please allow enough time on your schedule to allow for any/all of these possibilities.    If you cannot keep your appointment, please call [phone] to cancel or reschedule prior to your appointment date.  This allows [place] the opportunity to schedule an appointment for another patient in need of care.  If you do not cancel or reschedule by 5 p.m. the business day prior to your appointment date, you will be charged a $50.00 late cancellation/no-show fee.    Thank you for choosing  [facility] for your medical needs.  We appreciate the opportunity to care for you.  Please visit [place] at our website  to learn more about our practice.                     Sincerely,                                                             The Gastroenterology Division

## 2010-06-15 NOTE — Assessment & Plan Note (Signed)
Summary: per check out/sf  Medications Added SPIRONOLACTONE 25 MG TABS (SPIRONOLACTONE) 1po daily VITAMIN D (ERGOCALCIFEROL) 50000 UNIT CAPS (ERGOCALCIFEROL) 1 by mouth weekly      Allergies Added:   Referring Provider:  [name] Primary Provider:  Dow Adolph, MD   History of Present Illness: 50 yo with systolic CHF probably secondary to severe MR now s/p MV repair, rheumatoid arthritis, and PUD presents for followup.  Since I last saw her, she was admitted to [facility] earlier this month with a CHF exacerbation.  She was diuresed and discharged.  Echo showed that systolic function now is somewhat improved (EF 45% with no mitral regurgitation).  The cause of the CHF exacerbation was probably dietary indiscretion.  Patient is breathing reasonably well now and can walk up to 100 yards at a time before having to stop for dyspnea.  She still props herself up on several pillows at night.  Her weight is increased since I last saw her, which she attributes to not being able to sleep at night and snacking a lot at night.  She continues to be limited most by her knee pain and back pain.   Labs (1/11): BNP 92, K 4.7, creatinine 1.4 Labs (2/11): K 4.3, creatinine 1.2 Labs (3/11): K 4, creatinine 0.9 Labs (5/11): K 3.9, creatinine 0.9, BNP 124 Labs (12/01/09): K 4.8, creatinine 1.6, HCT 27.8  Current Medications (verified): 1)  Vicodin 5-500 Mg  Tabs (Hydrocodone-Acetaminophen) .... #60 Monthly, One To Two By Mouth Q 4 To 6 Hrs Prn 2)  Aspirin 81 Mg Tabs (Aspirin) .... Once Daily 3)  Citalopram Hydrobromide 40 Mg Tabs (Citalopram Hydrobromide) .... Take One Tablet By Mouth Once Daily 4)  Omeprazole 40 Mg Cpdr (Omeprazole) .... One Daily 5)  Ventolin Hfa 108 (90 Base) Mcg/act Aers (Albuterol Sulfate) .Marland Kitchen.. 1 Puff As Needed 6)  Carvedilol 6.25 Mg Tabs (Carvedilol) .... Two Times A Day 7)  Furosemide 80 Mg Tabs (Furosemide) .... Take One Tablet in The Morning and One-Half Tablet in The Evening 8)   Ferrex 150 150 Mg Caps (Polysaccharide Iron Complex) .... Once Daily 9)  Cozaar 50 Mg Tabs (Losartan Potassium) .... One Daily 10)  Alprazolam 0.5 Mg Tabs (Alprazolam) .... Take One Tablet At Bedtime For Sleep 11)  Hydralazine Hcl 25 Mg Tabs (Hydralazine Hcl) .... One and One-Half   Tablets  Three Times A Day 12)  Isosorbide Mononitrate Cr 60 Mg Xr24h-Tab (Isosorbide Mononitrate) .... One Tablet Daily 13)  Promethazine Hcl 12.5 Mg Tabs (Promethazine Hcl) .... One Every 6 Hours As Needed For Nausea 14)  Spironolactone 25 Mg Tabs (Spironolactone) .Marland Kitchen.. 1po Daily 15)  Vitamin D (Ergocalciferol) 50000 Unit Caps (Ergocalciferol) .Marland Kitchen.. 1 By Mouth Weekly  Allergies (verified): 1)  ! * Colchicine  Past History:  Past Medical History:  1. Congestive heart failure, most likely secondary to severe mitral regurgitation.   TEE (6/10): EF 40%, diffuse hypokinesis,  mild to moderately dilated LV, severe eccentric MR, small PFO.  Cardiac MRI (6/10): EF 37% with global hypokinesis, moderate to severe MR, no delayed enhancement (no evidence for sarcoidosis or other infiltrative disease).  TTE (10/10) after MV repair: EF 25-30%, moderately dilated LV, moderate diastolic dysfunction, mildly depressed RV function, trivial MR, MV mean gradient 5 mmHg, PASP 30 mmHg.  RHC (12/10): mean RA 19, PA 46/26, mean PCWP 28, CI 2.5, SVO2 63%.  TTE (2/11): EF 35-40% with diffuse hypokinesis, no significant MR or MS.  TTE (7/11): EF 45%, mild global hypokinesis, s/p MV  repair, no mitral regurgitation, minimal mitral stenosis, PA systolic pressure 40 mmHg, mild LV dilation.   2. Severe mitral regurgitation (see above).  Minimally invasive mitral valve repair on 12/18/08.  She additionally had TV repair and PFO closure.   3. Microcytic anemia, secondary to chronic gastric blood loss.   4. Gastric ulcers.   5. Morbid obesity.   6. Obstructive sleep apnea, on CPAP.   7. GERD.   8. Hypertension, controlled.   9. Rheumatoid arthritis   10. Gout.   11. Depression.   12. LHC (6/10): No angiographic CAD.  Mildly dilated LV. 3+ MR.  EF 40%.   13. Prior smoking, quit   14. Colitis (2/11)  Family History: Reviewed history from 11/24/2008 and no changes required. Negative for cardiac or renal disease.   Social History: Reviewed history from 09/22/2009 and no changes required. Single, 3 children.  Unemployed.  Tobacco Use - Yes.  Smoked < 1 ppd, quit 6/10.  Alcohol Use - no Regular Exercise - no Drug Use - no, hx crack-cocaine use  Review of Systems       All systems reviewed and negative except as per HPI.   Vital Signs:  Patient profile:   50 year old female Height:      67 inches Weight:      316 pounds BMI:     49.67 Pulse rate:   73 / minute Resp:     18 per minute BP sitting:   112 / 70  (right arm)  Vitals Entered By: Marrion Coy, CNA (December 08, 2009 3:11 PM)  Physical Exam  General:  Well developed, well nourished, in no acute distress. Obese.  Neck:  Neck supple, JVP 8-9 cm. No masses, thyromegaly or abnormal cervical nodes. Lungs:  Clear bilaterally to auscultation and percussion. Heart:  Lateral PMI, chest non-tender; regular rate and rhythm, S1, S2 with no murmur noted.  No S3. Carotid upstroke normal, no bruit. Pedals normal pulses. No edema.  Abdomen:  Bowel sounds positive; abdomen soft and non-tender without masses, organomegaly, or hernias noted. No hepatosplenomegaly. Extremities:  No clubbing or cyanosis. Neurologic:  Alert and oriented x 3. Psych:  Normal affect.   Impression & Recommendations:  Problem # 1:  CHF (ICD-428.0) EF improved to 45% with no MR on last echo done while she was an inpatient earlier this month.  It sounds like the cause of her admission was significant dietery indiscretion.  She still has significant volume on board but creatinine has risen with diuresis.  Therefore, I will continue Lasix at the current dose (80 mg two times a day).  Symptoms are stable.  I will  have her get a BMET/BNP today. Continue current doses of Coreg, losartan, hydralazine, Imdur, and spironolactone.   Problem # 2:  MITRAL VALVE DISORDERS (ICD-424.0) No significant MR or MS s/p MV repair on most recent echo.   Other Orders: TLB-BNP (B-Natriuretic Peptide) (83880-BNPR) TLB-BMP (Basic Metabolic Panel-BMET) (80048-METABOL)  Patient Instructions: 1)  Your physician recommends that you have lab today--BMP/BNP  428.22. 2)  Your physician recommends that you schedule a follow-up appointment in: 6 weeks with Dr Shirlee Latch.

## 2010-06-15 NOTE — Letter (Signed)
Summary: New Patient letter  [facility]  [address] [place], [place] [address]   Phone: [phone]  Fax: [phone]       02/03/2010 MRN: [id number]  [facility] [address] [place], [place]  [address]  Dear Ms. Humphries,  Welcome to the Gastroenterology Division at [facility].    You are scheduled to see Dr.  Arlyce Dice on 03-02-10 at 3:00p.m. on the 3rd floor at [facility], [address]  We ask that you try to arrive at our office 15 minutes prior to your appointment time to allow for check-in.  We would like you to complete the enclosed self-administered evaluation form prior to your visit and bring it with you on the day of your appointment.  We will review it with you.  Also, please bring a complete list of all your medications or, if you prefer, bring the medication bottles and we will list them.  Please bring your insurance card so that we may make a copy of it.  If your insurance requires a referral to see a specialist, please bring your referral form from your primary care physician.  Co-payments are due at the time of your visit and may be paid by cash, check or credit card.     Your office visit will consist of a consult with your physician (includes a physical exam), any laboratory testing he/she may order, scheduling of any necessary diagnostic testing (e.g. x-ray, ultrasound, CT-scan), and scheduling of a procedure (e.g. Endoscopy, Colonoscopy) if required.  Please allow enough time on your schedule to allow for any/all of these possibilities.    If you cannot keep your appointment, please call [phone] to cancel or reschedule prior to your appointment date.  This allows [place] the opportunity to schedule an appointment for another patient in need of care.  If you do not cancel or reschedule by 5 p.m. the business day prior to your appointment date, you will be charged a $50.00 late cancellation/no-show fee.    Thank you for  choosing  Gastroenterology for your medical needs.  We appreciate the opportunity to care for you.  Please visit [place] at our website  to learn more about our practice.                     Sincerely,                                                             The Gastroenterology Division

## 2010-06-15 NOTE — Assessment & Plan Note (Signed)
Summary: per check out/sf  Medications Added FUROSEMIDE 80 MG TABS (FUROSEMIDE) take one tablet in the morning and one-half tablet in the evening HYDRALAZINE HCL 25 MG TABS (HYDRALAZINE HCL) one and one-half   tablets  three times a day IMDUR 30 MG XR24H-TAB (ISOSORBIDE MONONITRATE) one tablet daily      Allergies Added:   Visit Type:  Follow-up Referring Provider:  [name] Primary Provider:  Dow Adolph, MD  CC:  sob-left hand pain and edema -chest pain.  History of Present Illness: 50 yo with systolic CHF probably secondary to severe MR now s/p MV Lyons, rheumatoid arthritis, and PUD presents for followup.  Main complaint today continues to be severe left knee pain.  She has quite significant osteoarthritis and needs the knee replaced.  She is going to have to wait until she can get disability and insurance coverage to get an operation.  She also has chronic back pain/spasm.  These combine to limit her exercise capacity considerably.  She is walking with a walker because of the knee.    Since I last saw her, she has gained almost 20 lbs.  Her daughter says that she is very sedentary and has quite significant dietary indiscretion.  Diet is high in fat and salt.  She is not short of breath walking around the house but does not do much more activity than that.  She does have orthopnea and is sleeping in a recliner again.  She has had a nocturnal cough for about 2 wks.    Labs (1/11): BNP 92, K 4.7, creatinine 1.4 Labs (2/11): K 4.3, creatinine 1.2 Labs (3/11): K 4, creatinine 0.9  ECG: NSR, nonspecific T wave flattening.   Current Medications (verified): 1)  Vicodin 5-500 Mg  Tabs (Hydrocodone-Acetaminophen) .... #60 Monthly, One To Two By Mouth Q 4 To 6 Hrs Prn 2)  Aspirin 81 Mg Tabs (Aspirin) .... Once Daily 3)  Citalopram Hydrobromide 40 Mg Tabs (Citalopram Hydrobromide) .... Take One Tablet By Mouth Once Daily 4)  Protonix 40 Mg Pack (Pantoprazole Sodium) .... Once  Daily 5)  Ventolin Hfa 108 (90 Base) Mcg/act Aers (Albuterol Sulfate) .Marland Kitchen.. 1 Puff As Needed 6)  Carvedilol 6.25 Mg Tabs (Carvedilol) .... Two Times A Day 7)  Furosemide 40 Mg Tabs (Furosemide) .... Take One Tablet By Mouth Twice A Day 8)  Ferrex 150 150 Mg Caps (Polysaccharide Iron Complex) .... Once Daily 9)  Lisinopril 20 Mg Tabs (Lisinopril) .Marland Kitchen.. 1 By Mouth One Time A Day 10)  Alprazolam 0.5 Mg Tabs (Alprazolam) .... Take One Tablet At Bedtime For Sleep 11)  Hydralazine Hcl 25 Mg Tabs (Hydralazine Hcl) .... One  Tablet Three Times A Day 12)  Isosorbide Mononitrate Cr 60 Mg Xr24h-Tab (Isosorbide Mononitrate) .... One Tablet Daily  Allergies (verified): 1)  ! * Colchicine  Past History:  Past Medical History: Reviewed history from 07/08/2009 and no changes required.  1. Congestive heart failure, most likely secondary to severe mitral regurgitation.   TEE (6/10): EF 40%, diffuse hypokinesis,  mild to moderately dilated LV, severe eccentric MR, small PFO.  Cardiac MRI (6/10): EF 37% with global hypokinesis, moderate to severe MR, no delayed enhancement (no evidence for sarcoidosis or other infiltrative disease).  TTE (10/10) after MV Lyons: EF 25-30%, moderately dilated LV, moderate diastolic dysfunction, mildly depressed RV function, trivial MR, MV mean gradient 5 mmHg, PASP 30 mmHg.  RHC (12/10): mean RA 19, PA 46/26, mean PCWP Tasha, CI 2.5, SVO2 63%.  TTE (2/11): EF 35-40%  with diffuse hypokinesis, no significant MR or MS.   2. Severe mitral regurgitation (see above).  Minimally invasive mitral valve Lyons on 12/18/08.  She additionally had TV Lyons and PFO closure.   3. Microcytic anemia, secondary to chronic gastric blood loss.   4. Gastric ulcers.   5. Morbid obesity.   6. Obstructive sleep apnea, on CPAP.   7. GERD.   8. Hypertension, controlled.   9. Rheumatoid arthritis  10. Gout.   11. Depression.   12. LHC (6/10): No angiographic CAD.  Mildly dilated LV. 3+ MR.  EF 40%.   13.  Prior smoking, quit   14. Colitis (2/11)  Family History: Reviewed history from 11/24/2008 and no changes required. Negative for cardiac or renal disease.   Social History: Reviewed history from 04/07/2009 and no changes required. Single, 3 children.  Works at [employer].  Tobacco Use - Yes.  Smoked < 1 ppd, quit 6/10.  Alcohol Use - no Regular Exercise - no Drug Use - no, hx crack-cocaine use  Review of Systems       All systems negative except as per HPI.   Vital Signs:  Patient profile:   50 year old female Height:      67 inches Weight:      317 pounds BMI:     49.83 Pulse rate:   84 / minute BP sitting:   121 / 71  (left arm) Cuff size:   large  Vitals Entered By: Burnett Kanaris, CNA (September 01, 2009 4:00 PM)  Physical Exam  General:  Well developed, well nourished, in no acute distress. Obese.  Neck:  Neck supple, JVP 10-12 cm. No masses, thyromegaly or abnormal cervical nodes. Lungs:  Clear bilaterally to auscultation and percussion. Heart:  Lateral PMI, chest non-tender; regular rate and rhythm, S1, S2 with no murmur noted.  No S3. Carotid upstroke normal, no bruit. Pedals normal pulses. 1+ edema 1/3 up lower legs bilaterally.  Abdomen:  Bowel sounds positive; abdomen soft without masses, organomegaly, or hernias noted. No hepatosplenomegaly. Obese.  Extremities:  No clubbing or cyanosis. Neurologic:  Alert and oriented x 3. Psych:  Normal affect.   Impression & Recommendations:  Problem # 1:  CHRONIC SYSTOLIC HEART FAILURE (ICD-428.22) Patient has gained 20 lbs since I last saw her.  She is sedentary due to her knee and back pain.  She has quite significant dietary indiscretion.  She has probably NYHA class III symptoms with orthopnea.  Volume overload on exam.  Comfortable at rest.  I am going to have her increase Lasix to 80 mg qam, 40 mg qpm.  She will increase hydralazine to 37.5 mg three times a day and Imdur to 90 mg daily. This may help with her  congestive symptoms.  Continue same doses of lisinopril and Coreg.  I will increase her Coreg when she is more euvolemic.  She will followup in 2-3 weeks with BMET and BNP prior to followup.  I talked to her about sodium restriction.  She is going to see a nutritionist.    Problem # 2:  MITRAL VALVE DISORDERS (ICD-424.0) No significant MR or MS s/p MV Lyons on most recent echo.   Problem # 3:  OBESITY NOS (ICD-278.00) Continues to gain weight, not all is fluid.  She direly needs to alter her diet.    Patient Instructions: 1)  Your physician has recommended you make the following change in your medication:  2)  Increase Lasix(furosemide) to 80mg  in the  morning and 40mg  in the evening(this will be one-half of an 80mg  tablet in the evening) 3)  Increase Hydralazine to 37.5mg  three times a day--this will be one and one half of a 25mg  tablet three times a day 4)  Increase Imdur to 90mg  daily--this will be a 60mg  and a 30mg  tablet 5)  Your physician recommends that you schedule a follow-up appointment in: 2-3 weeks with Dr Shirlee Latch. 6)  Your physician recommends that you return for lab work in: a few days before the appointment with Dr Shirlee Latch in 2-3 weeks BMP/BNP 428.22 Prescriptions: IMDUR 30 MG XR24H-TAB (ISOSORBIDE MONONITRATE) one tablet daily  #30 x 6   Entered by:   Katina Dung, RN, BSN   Authorized by:   [name], MD   Signed by:   Katina Dung, RN, BSN on 09/01/2009   Method used:   Electronically to        [facility] [phone]* (retail)       [address]       [place], [place]  [address]       Ph: [phone]       Fax: [phone]   RxID:   [id number] HYDRALAZINE HCL 25 MG TABS (HYDRALAZINE HCL) one and one-half   tablets  three times a day  #150 x 6   Entered by:   Katina Dung, RN, BSN   Authorized by:   [name], MD   Signed by:   Katina Dung, RN, BSN on 09/01/2009   Method used:   Electronically to        [facility] [phone]* (retail)        [address]       [place], [place]  [address]       Ph: [phone]       Fax: [phone]   RxID:   [id number] FUROSEMIDE 80 MG TABS (FUROSEMIDE) take one tablet in the morning and one-half tablet in the evening  #50 x 6   Entered by:   Katina Dung, RN, BSN   Authorized by:   [name], MD   Signed by:   Katina Dung, RN, BSN on 09/01/2009   Method used:   Electronically to        [facility] [phone]* (retail)       [address]       [place], [place]  [address]       Ph: [phone]       Fax: [phone]   RxID:   [id number]

## 2010-06-15 NOTE — Assessment & Plan Note (Signed)
Summary: per check out/sf  Medications Added ATACAND 16 MG TABS (CANDESARTAN CILEXETIL) one tablet daily      Allergies Added:    Referring Provider:  [name] Primary Provider:  Dow Adolph, MD  CC:  follow up.  Pt had labs today.  Only concern she has is the cough that is chronic according to her.  .  History of Present Illness: 50 yo with systolic CHF probably secondary to severe MR now s/p MV repair, rheumatoid arthritis, and PUD presents for followup.  Main complaint today continues to be severe left knee pain.  She has quite significant osteoarthritis and needs the knee replaced.  She is going to have to wait until she can get disability and insurance coverage to get an operation.  She also has chronic back pain/spasm.  These combine to limit her exercise capacity considerably.  She is walking with a walker because of the knee.    She has lost 5 lbs since I last saw her and increased her diuretic.  She is breathing better but still cannot lie totally flat.  She has had a bothersome chronic dry cough for over a month now.  No chest pain.  As above, exertion is very limited by her knee.   Labs (1/11): BNP 92, K 4.7, creatinine 1.4 Labs (2/11): K 4.3, creatinine 1.2 Labs (3/11): K 4, creatinine 0.9 Labs (5/11): K 3.9, creatinine 0.9, BNP 124  Current Medications (verified): 1)  Vicodin 5-500 Mg  Tabs (Hydrocodone-Acetaminophen) .... #60 Monthly, One To Two By Mouth Q 4 To 6 Hrs Prn 2)  Aspirin 81 Mg Tabs (Aspirin) .... Once Daily 3)  Citalopram Hydrobromide 40 Mg Tabs (Citalopram Hydrobromide) .... Take One Tablet By Mouth Once Daily 4)  Protonix 40 Mg Pack (Pantoprazole Sodium) .... Once Daily 5)  Ventolin Hfa 108 (90 Base) Mcg/act Aers (Albuterol Sulfate) .Marland Kitchen.. 1 Puff As Needed 6)  Carvedilol 6.25 Mg Tabs (Carvedilol) .... Two Times A Day 7)  Furosemide 80 Mg Tabs (Furosemide) .... Take One Tablet in The Morning and One-Half Tablet in The Evening 8)  Ferrex 150 150 Mg  Caps (Polysaccharide Iron Complex) .... Once Daily 9)  Lisinopril 20 Mg Tabs (Lisinopril) .Marland Kitchen.. 1 By Mouth One Time A Day 10)  Alprazolam 0.5 Mg Tabs (Alprazolam) .... Take One Tablet At Bedtime For Sleep 11)  Hydralazine Hcl 25 Mg Tabs (Hydralazine Hcl) .... One and One-Half   Tablets  Three Times A Day 12)  Isosorbide Mononitrate Cr 60 Mg Xr24h-Tab (Isosorbide Mononitrate) .... One Tablet Daily 13)  Imdur 30 Mg Xr24h-Tab (Isosorbide Mononitrate) .... One Tablet Daily  Allergies (verified): 1)  ! * Colchicine  Past History:  Past Medical History: Reviewed history from 07/08/2009 and no changes required.  1. Congestive heart failure, most likely secondary to severe mitral regurgitation.   TEE (6/10): EF 40%, diffuse hypokinesis,  mild to moderately dilated LV, severe eccentric MR, small PFO.  Cardiac MRI (6/10): EF 37% with global hypokinesis, moderate to severe MR, no delayed enhancement (no evidence for sarcoidosis or other infiltrative disease).  TTE (10/10) after MV repair: EF 25-30%, moderately dilated LV, moderate diastolic dysfunction, mildly depressed RV function, trivial MR, MV mean gradient 5 mmHg, PASP 30 mmHg.  RHC (12/10): mean RA 19, PA 46/26, mean PCWP 28, CI 2.5, SVO2 63%.  TTE (2/11): EF 35-40% with diffuse hypokinesis, no significant MR or MS.   2. Severe mitral regurgitation (see above).  Minimally invasive mitral valve repair on 12/18/08.  She additionally  had TV repair and PFO closure.   3. Microcytic anemia, secondary to chronic gastric blood loss.   4. Gastric ulcers.   5. Morbid obesity.   6. Obstructive sleep apnea, on CPAP.   7. GERD.   8. Hypertension, controlled.   9. Rheumatoid arthritis  10. Gout.   11. Depression.   12. LHC (6/10): No angiographic CAD.  Mildly dilated LV. 3+ MR.  EF 40%.   13. Prior smoking, quit   14. Colitis (2/11)  Family History: Reviewed history from 11/24/2008 and no changes required. Negative for cardiac or renal disease.   Social  History: Single, 3 children.  Unemployed.  Tobacco Use - Yes.  Smoked < 1 ppd, quit 6/10.  Alcohol Use - no Regular Exercise - no Drug Use - no, hx crack-cocaine use  Review of Systems       All systems reviewed and negative except as per HPI.   Vital Signs:  Patient profile:   50 year old female Height:      67 inches Weight:      312 pounds BMI:     49.04 Pulse rate:   78 / minute Pulse rhythm:   regular BP sitting:   114 / 76  (left arm) Cuff size:   large  Vitals Entered By: Judithe Modest CMA (Sep 22, 2009 3:56 PM)  Physical Exam  General:  Well developed, well nourished, in no acute distress. Obese.  Neck:  Neck supple, JVP 7-8 cm. No masses, thyromegaly or abnormal cervical nodes. Lungs:  Clear bilaterally to auscultation and percussion. Heart:  Lateral PMI, chest non-tender; regular rate and rhythm, S1, S2 with no murmur noted.  No S3. Carotid upstroke normal, no bruit. Pedals normal pulses. Trace ankle edema bilaterally.  Abdomen:  Bowel sounds positive; abdomen soft without masses, organomegaly, or hernias noted. No hepatosplenomegaly. Obese.  Extremities:  No clubbing or cyanosis. Neurologic:  Alert and oriented x 3. Psych:  Normal affect.   Impression & Recommendations:  Problem # 1:  CHRONIC SYSTOLIC HEART FAILURE (ICD-428.22) Volume status is better.  I will have her continue current Lasix as renal fxn and potassium are stable.  She will continue current Coreg and hydralazine/Imdur.  Given her chronic cough, I am going to try her off lisinopril.  I will replace the lisinopril with candesartan 16 mg daily.  BNP is only mildly elevated. Unfortunately, her activity is severely limited by knee OA.   Problem # 2:  MITRAL VALVE DISORDERS (ICD-424.0) No significant MR or MS s/p MV repair on most recent echo.   Problem # 3:  OBESITY NOS (ICD-278.00) Needs weight loss.  Will see nutritionist later this month.   Patient Instructions: 1)  Your physician has  recommended you make the following change in your medication:  2)  Stop Lisinopril 3)  Start Atacand 16mg  daily--this will be one-half of a 32mg  tablet--when you get the medication from [facility] it will be Atacand 16mg  tablet daily--[facility] said it would take 6-8 weeks to get the medication for you 4)  Your physician recommends that you return for lab work in: 2 weeks --BMP/BNP  428.22 424.0 5)  Your physician recommends that you schedule a follow-up appointment in: 3 months with Dr Shirlee Latch. Prescriptions: ATACAND 16 MG TABS (CANDESARTAN CILEXETIL) one tablet daily  #30 x 11   Entered by:   Katina Dung, RN, BSN   Authorized by:   [name], MD   Signed by:   Katina Dung, RN, BSN  on 09/22/2009   Method used:   Faxed to ...       [facility] (retail)       [address] [place], [place]  [address]       Ph: [phone]       Fax: [phone]   RxID:   [id number]

## 2010-06-15 NOTE — Progress Notes (Signed)
Summary: Calling regarding medication  Medications Added OMEPRAZOLE 40 MG CPDR (OMEPRAZOLE) one daily COZAAR 50 MG TABS (LOSARTAN POTASSIUM) one daily       Phone Note Call from Patient Call back at Home Phone [phone]   Caller: Patient Summary of Call: Pt calling regarding her medication Initial call taken by: Judie Grieve,  December 03, 2009 10:59 AM  Follow-up for Phone Call        per pt and [facility]--Atacand/Protonix  requires prior authorization-pt states she has just become eligible for Medicaid today--Medicaid number [id number] P [phone] called Medicaid to get prior authorization and talked with Angie--pt has to be put in [facility] system and it may be 24hours before I can get authorization for Atacand--Dr Shirlee Latch recommended changing Protonix to Omeprazole 40mg    pt is aware that I cannot get prior authorization for Atacand for Janey Genta, RN, BSN  December 03, 2009 11:51 AM   Additional Follow-up for Phone Call Additional follow up Details #1::        anne watlington with social services calling back with pt's id # for medicaid [id number] if anymore problems you can reach her at [phone] Glynda Jaeger  December 03, 2009 11:48 AM  talked with Efraim Kaufmann at Medicaid--account still is not Deretha Emory, RN, BSN  December 03, 2009 3:48 PM  Pt returnng call Judie Grieve  December 03, 2009 4:11 PM--I talked with Nedra Hai --she is still unable to activate 2nd system to begin prior authorization process--I have again been told it may take 24 hours to activate the 2nd system Katina Dung, RN, BSN  December 03, 2009 5:07 PM      New/Updated Medications: OMEPRAZOLE 40 MG CPDR (OMEPRAZOLE) one daily COZAAR 50 MG TABS (LOSARTAN POTASSIUM) one daily Prescriptions: COZAAR 50 MG TABS (LOSARTAN POTASSIUM) one daily  #30 x 11   Entered by:   Katina Dung, RN, BSN   Authorized by:   [name], MD   Signed by:   Katina Dung, RN, BSN on 12/04/2009   Method used:    Electronically to        [facility] [phone]* (retail)       [address]       [place], [place]  [address]       Ph: [phone]       Fax: [phone]   RxID:   [id number] OMEPRAZOLE 40 MG CPDR (OMEPRAZOLE) one daily  #30 x 11   Entered by:   Katina Dung, RN, BSN   Authorized by:   [name], MD   Signed by:   Katina Dung, RN, BSN on 12/03/2009   Method used:   Electronically to        [facility] [phone]* (retail)       [address]       [place], [place]  [address]       Ph: [phone]       Fax: [phone]   RxID:   [id number]  talked with patient--she will call [phone] and check on the status of her medicaid Katina Dung, RN, BSN  December 03, 2009 4:29 PM  pt aware it will be tomorrow before I can try to get Atacand authorized Katina Dung, RN, BSN  December 03, 2009 5:38 PM   Atacand not preferred  by [facility] per Daiva Nakayama at Medicaid--Pt has to try Cozaar before Medicaid will approve Atacand even though pt has had a cough with Lisinopril--reviewed with Dr Milda Smart prescribe Cozaar 50mg  daily in the place of  Atacand--pt for repeat BMP 7/25/111 and see Dr Shirlee Latch 12/08/09--pt aware of the above

## 2010-06-15 NOTE — Procedures (Signed)
Summary: EGD/Patrick Elnoria Howard MD   EGD/Patrick Elnoria Howard MD   Imported By: Lester [place] 04/21/2010 11:32:09  _____________________________________________________________________  External Attachment:    Type:   Image     Comment:   External Document

## 2010-06-15 NOTE — Progress Notes (Signed)
Summary: QUESTION ON MEDS  Medications Added VICODIN 5-500 MG  TABS (HYDROCODONE-ACETAMINOPHEN) #60 monthly, one to two by mouth q 4 to 6 hrs prn ASPIRIN 325 MG TABS (ASPIRIN) once daily ASPIRIN 81 MG TABS (ASPIRIN) once daily CELEXA 20 MG TABS (CITALOPRAM HYDROBROMIDE) once daily CITALOPRAM HYDROBROMIDE 40 MG TABS (CITALOPRAM HYDROBROMIDE) take one tablet by mouth once daily CATAPRES 0.1 MG TABS (CLONIDINE HCL) by mouth two times a day CATAPRES 0.1 MG TABS (CLONIDINE HCL) by mouth two times a day LOVENOX 40 MG/0.4ML SOLN (ENOXAPARIN SODIUM) [place] daily LOVENOX 40 MG/0.4ML SOLN (ENOXAPARIN SODIUM) [place] daily IRON 325 (65 FE) MG TABS (FERROUS SULFATE) three times a day IRON 325 (65 FE) MG TABS (FERROUS SULFATE) three times a day FUROSEMIDE 40 MG/4ML SOLN (FUROSEMIDE) IV two times a day FUROSEMIDE 40 MG/4ML SOLN (FUROSEMIDE) IV two times a day PRINIVIL 20 MG TABS (LISINOPRIL) once daily PRINIVIL 20 MG TABS (LISINOPRIL) once daily OMEPRAZOLE 40 MG CPDR (OMEPRAZOLE) one daily PROTONIX 40 MG PACK (PANTOPRAZOLE SODIUM) once daily PREDNISONE 10 MG TABS (PREDNISONE) once daily PREDNISONE 10 MG TABS (PREDNISONE) once daily HYDRALAZINE HCL 20 MG/ML SOLN (HYDRALAZINE HCL) 10 mg IV every 6 hours as needed HYDRALAZINE HCL 20 MG/ML SOLN (HYDRALAZINE HCL) 10 mg IV every 6 hours as needed ZOFRAN 4 MG/2ML SOLN (ONDANSETRON HCL) IV every 6 hours as needed ZOFRAN 4 MG/2ML SOLN (ONDANSETRON HCL) IV every 6 hours as needed TYLENOL 8 HOUR 650 MG CR-TABS (ACETAMINOPHEN) every four hours as needed TYLENOL 8 HOUR 650 MG CR-TABS (ACETAMINOPHEN) every four hours as needed VENTOLIN HFA 108 (90 BASE) MCG/ACT AERS (ALBUTEROL SULFATE) 1 puff as needed CARVEDILOL 6.25 MG TABS (CARVEDILOL) two times a day CYCLOBENZAPRINE HCL 10 MG TABS (CYCLOBENZAPRINE HCL) 1 to 2 tablets daily CYCLOBENZAPRINE HCL 10 MG TABS (CYCLOBENZAPRINE HCL) 1 to 2 tablets daily FUROSEMIDE 40 MG TABS (FUROSEMIDE) one tab by mouth once  daily FUROSEMIDE 40 MG TABS (FUROSEMIDE) Take one tablet by mouth twice a day FUROSEMIDE 40 MG TABS (FUROSEMIDE) Take one tablet by mouth daily. FUROSEMIDE 80 MG TABS (FUROSEMIDE) take one tablet in the morning and one-half tablet in the evening FUROSEMIDE 40 MG TABS (FUROSEMIDE) 1 twice a day for four days the 1 daily FUROSEMIDE 80 MG TABS (FUROSEMIDE) take one tablet in the morning and one  tablet in the evening LASIX 40 MG TABS (FUROSEMIDE) 2 by mouth two times a day FUROSEMIDE 80 MG TABS (FUROSEMIDE) take one tablet in the morning and one  tablet in the evening LASIX 40 MG TABS (FUROSEMIDE) take 2 tablets daily LASIX 40 MG TABS (FUROSEMIDE) 1 (1/2) twice a day * LASIX 40 MG TABS (FUROSEMIDE)-HOLD FOR 3 DAYS 06-25-09 2 by mouth two times a day LISINOPRIL 20 MG TABS (LISINOPRIL) 1 daily LISINOPRIL 20 MG TABS (LISINOPRIL) 1 daily LISINOPRIL 10 MG TABS (LISINOPRIL) 1 daily KLOR-CON M20 20 MEQ CR-TABS (POTASSIUM CHLORIDE CRYS CR) 1 tab by mouth once daily KLOR-CON M20 20 MEQ CR-TABS (POTASSIUM CHLORIDE CRYS CR) 1 tab by mouth once daily PREDNISONE 5 MG TABS (PREDNISONE) take as directed PREDNISONE 5 MG TABS (PREDNISONE) take as directed FERREX 150 150 MG CAPS (POLYSACCHARIDE IRON COMPLEX) once daily LISINOPRIL 10 MG TABS (LISINOPRIL) one and one-half tablets twice a day COZAAR 50 MG TABS (LOSARTAN POTASSIUM) one daily LISINOPRIL 10 MG TABS (LISINOPRIL) Take one tablet by mouth daily ATACAND 16 MG TABS (CANDESARTAN CILEXETIL) one tablet daily LISINOPRIL 20 MG TABS (LISINOPRIL) 1 by mouth two times a day ATACAND 8 MG TABS (CANDESARTAN CILEXETIL)  one and one-half tablets daily LISINOPRIL 20 MG TABS (LISINOPRIL) ONE TWICE A DAY LISINOPRIL 20 MG TABS (LISINOPRIL) 1 by mouth one time a day LISINOPRIL 10 MG TABS (LISINOPRIL) 1 twice a day ALPRAZOLAM 0.5 MG TABS (ALPRAZOLAM) take one tablet at bedtime for sleep SPIRONOLACTONE 25 MG TABS (SPIRONOLACTONE) 1 daily SPIRONOLACTONE 25 MG TABS  (SPIRONOLACTONE) 1 daily SPIRONOLACTONE 25 MG TABS (SPIRONOLACTONE) (1/2) daily GLUCOSAMINE-CHONDROITIN   CAPS (GLUCOSAMINE-CHONDROIT-VIT C-MN) as directed GLUCOSAMINE-CHONDROITIN   CAPS (GLUCOSAMINE-CHONDROIT-VIT C-MN) as directed METOLAZONE 2.5 MG TABS (METOLAZONE) ONE TABLET ON WED AND SATURDAY METOLAZONE 2.5 MG TABS (METOLAZONE) ONE TABLET ON WED AND SATURDAY HYDRALAZINE HCL 25 MG TABS (HYDRALAZINE HCL) one and one-half   tablets  three times a day HYDRALAZINE HCL 25 MG TABS (HYDRALAZINE HCL) one  tablet three times a day HYDRALAZINE HCL 25 MG TABS (HYDRALAZINE HCL) one-half tablet three times a day ISOSORBIDE MONONITRATE CR 60 MG XR24H-TAB (ISOSORBIDE MONONITRATE) one tablet daily IMDUR 30 MG XR24H-TAB (ISOSORBIDE MONONITRATE) one tablet daily FLEXERIL 10 MG TABS (CYCLOBENZAPRINE HCL) 1 by mouth two times a day FLEXERIL 10 MG TABS (CYCLOBENZAPRINE HCL) 1 by mouth two times a day IMDUR 30 MG XR24H-TAB (ISOSORBIDE MONONITRATE) one tablet daily IMDUR 30 MG XR24H-TAB (ISOSORBIDE MONONITRATE) one tablet daily PROMETHAZINE HCL 12.5 MG TABS (PROMETHAZINE HCL) one every 6 hours as needed for nausea SPIRONOLACTONE 25 MG TABS (SPIRONOLACTONE) 1po daily VITAMIN D (ERGOCALCIFEROL) 50000 UNIT CAPS (ERGOCALCIFEROL) 1 by mouth weekly * RESPIRIDONE 4MG  take one tablet once daily TORSEMIDE 20 MG TABS (TORSEMIDE) four tablets daily ZAROXOLYN 2.5 MG TABS (METOLAZONE) one every Friday--TAKE THIS 30 MINUTES BEFORE YOU TAKE TORSEMIDE. ZAROXOLYN 2.5 MG TABS (METOLAZONE) one every Tuesday--TAKE THIS 30 MINUTES BEFORE YOU TAKE TORSEMIDE. KLOR-CON M20 20 MEQ CR-TABS (POTASSIUM CHLORIDE CRYS CR) one daily--take two on the days that you take Metolazone       Phone Note Call from Patient Call back at [facility] Phone [phone]   Caller: Patient Reason for Call: Talk to Nurse Summary of Call: PT HAS QUESTION ON MEDS.  Initial call taken by: Roe Coombs,  March 09, 2010 9:16 AM  Follow-up for Phone  Call        I talked with pt-she needs prescription for Zaroxolyn    New/Updated Medications: ZAROXOLYN 2.5 MG TABS (METOLAZONE) one every Tuesday--TAKE THIS 30 MINUTES BEFORE YOU TAKE TORSEMIDE. Prescriptions: ZAROXOLYN 2.5 MG TABS (METOLAZONE) one every Tuesday--TAKE THIS 30 MINUTES BEFORE YOU TAKE TORSEMIDE.  #5 x 6   Entered by:   Katina Dung, RN, BSN   Authorized by:   [name], MD   Signed by:   Katina Dung, RN, BSN on 03/09/2010   Method used:   Electronically to        [facility] [phone]* (retail)       [address]       [place], [place]  [address]       Ph: [phone]       Fax: [phone]   RxID:   [id number]

## 2010-06-15 NOTE — Progress Notes (Signed)
Summary: pt feet hurt with edema   Phone Note Call from Patient Call back at Home Phone [phone]   Caller: Patient Reason for Call: Talk to Nurse, Talk to Doctor Summary of Call: pt feet are swollen really bad and they hurt not SOB but needs to know what to do she cant walk and in lots of pain Initial call taken by: Omer Jack,  March 01, 2010 8:35 AM  Follow-up for Phone Call        Pt. reports feet started swelling on Friday. States feet are "thick and she can't see her ankles." States feet and ankles hurt and she is unable to walk due to the pain and swelling.  No chest pain or SOB.  States wt is 347 today and was 350 when she last weighed on Saturday.  Taking torsemide and metolazone as prescribed.  Will forward note to Dr. Shirlee Latch. Dossie Arbour, RN, BSN  March 01, 2010 8:57 AM   Additional Follow-up for Phone Call Additional follow up Details #1::        pt calling back to find out what dr Shirlee Latch said, she is having a lot of pain, and wants to know if she should  go to the er -pls call [phone] Glynda Jaeger  March 01, 2010 10:24 AM Spoke with pt and advised her to go to ED as she is having increased pain in feet and ankles. Her daughter will take her. Additional Follow-up by: Dossie Arbour, RN, BSN,  March 01, 2010 10:33 AM     Appended Document: pt feet hurt with edema Can add her on to see me when I'm in the office next if not admitted in ER.   Appended Document: pt feet hurt with edema Pt was admitted to hospital on 03/01/10

## 2010-06-15 NOTE — Assessment & Plan Note (Signed)
Summary: rov  Medications Added PERCOCET 5-325 MG TABS (OXYCODONE-ACETAMINOPHEN) UAD ZAROXOLYN 2.5 MG TABS (METOLAZONE) one every Friday--TAKE THIS 30 MINUTES BEFORE YOU TAKE TORSEMIDE SPIRONOLACTONE 25 MG TABS (SPIRONOLACTONE) one-half daily      Allergies Added:   Visit Type:  Follow-up Referring Provider:  [name] Primary Provider:  Dow Adolph, MD   History of Present Illness: 50 yo with systolic CHF probably secondary to severe MR now s/p MV repair, rheumatoid arthritis, and PUD presents for followup. At last appointment, she was less short of breath but BMET showed creatinine up to 3.6, suspect due to overdiuresis.  I had her cut torsemide in half, stop losartan and spironolactone, and stop metolozone.  Repeat creatinine was 1.2.  She has, however, started to get short of breath again and feels like she is "filling up with fluid."  Earlier in the week, I had her increase her torsemide back to 80 mg daily.  Her weight is up 5 lbs since last appointment.    Labs (12/23/09) K 4.4, creatinine 1.1, BNP 145 Labs (9/11): K 3.9, creatinine 1.0, BNP 84, LFTs normal, HCT 29.3 Labs (10/11): K 4.0, creatinine 1.55 => 1.2, uric acid 13, TSH normal, BNP 75 => 172 Labs (11/11): BNP 110, creatinine 3.6 => 1.2   Current Medications (verified): 1)  Percocet 5-325 Mg Tabs (Oxycodone-Acetaminophen) .... Uad 2)  Aspirin 81 Mg Tabs (Aspirin) .... Once Daily 3)  Citalopram Hydrobromide 40 Mg Tabs (Citalopram Hydrobromide) .... Take One Tablet By Mouth Once Daily 4)  Ventolin Hfa 108 (90 Base) Mcg/act Aers (Albuterol Sulfate) .Marland Kitchen.. 1 Puff As Needed 5)  Carvedilol 6.25 Mg Tabs (Carvedilol) .... Two Times A Day 6)  Ferrex 150 150 Mg Caps (Polysaccharide Iron Complex) .... Once Daily 7)  Alprazolam 0.5 Mg Tabs (Alprazolam) .... Take One Tablet At Bedtime For Sleep 8)  Hydralazine Hcl 25 Mg Tabs (Hydralazine Hcl) .... One and One-Half   Tablets  Three Times A Day 9)  Promethazine Hcl 12.5 Mg  Tabs (Promethazine Hcl) .... One Every 6 Hours As Needed For Nausea 10)  Vitamin D (Ergocalciferol) 50000 Unit Caps (Ergocalciferol) .Marland Kitchen.. 1 By Mouth Weekly 11)  Respiridone 4mg  .... Take One Tablet Once Daily 12)  Torsemide 20 Mg Tabs (Torsemide) .... Four Tablets Daily 13)  Zaroxolyn 2.5 Mg Tabs (Metolazone) .... One Every Tuesday--Take This 30 Minutes Before You Take Torsemide./ Hold 14)  Klor-Con M20 20 Meq Cr-Tabs (Potassium Chloride Crys Cr) .... One Daily--Take Two On The Days That You Take Metolazone 15)  Isosorbide Mononitrate Cr 60 Mg Xr24h-Tab (Isosorbide Mononitrate) .... One Daily 16)  Nexium 40 Mg Cpdr (Esomeprazole Magnesium) .Marland Kitchen.. 1 Tab Once Daily  Allergies (verified): 1)  ! * Colchicine  Past History:  Past Medical History: Reviewed history from 01/28/2010 and no changes required.  1. Congestive heart failure, most likely secondary to severe mitral regurgitation.   TEE (6/10): EF 40%, diffuse hypokinesis,  mild to moderately dilated LV, severe eccentric MR, small PFO.  Cardiac MRI (6/10): EF 37% with global hypokinesis, moderate to severe MR, no delayed enhancement (no evidence for sarcoidosis or other infiltrative disease).  TTE (10/10) after MV repair: EF 25-30%, moderately dilated LV, moderate diastolic dysfunction, mildly depressed RV function, trivial MR, MV mean gradient 5 mmHg, PASP 30 mmHg.  RHC (12/10): mean RA 19, PA 46/26, mean PCWP 28, CI 2.5, SVO2 63%.  TTE (2/11): EF 35-40% with diffuse hypokinesis, no significant MR or MS.  TTE (7/11): EF 45%, mild global hypokinesis, s/p  MV repair, no mitral regurgitation, minimal mitral stenosis, PA systolic pressure 40 mmHg, mild LV dilation.   2. Severe mitral regurgitation (see above).  Minimally invasive mitral valve repair on 12/18/08.  She additionally had TV repair and PFO closure.   3. Microcytic anemia, secondary to chronic gastric blood loss.   4. Gastric ulcers.   5. Morbid obesity.   6. Obstructive sleep apnea, on CPAP.    7. GERD.   8. Hypertension, controlled.   9. Rheumatoid arthritis  10. Gout.   11. Depression.   12. LHC (6/10): No angiographic CAD.  Mildly dilated LV. 3+ MR.  EF 40%.   13. Prior smoking, quit   14. Colitis (2/11)  15. Auditory hallucinations, newly on Risperdal  Family History: Reviewed history from 11/24/2008 and no changes required. Negative for cardiac or renal disease.   Social History: Reviewed history from 09/22/2009 and no changes required. Single, 3 children.  Unemployed.  Tobacco Use - Yes.  Smoked < 1 ppd, quit 6/10.  Alcohol Use - no Regular Exercise - no Drug Use - no, hx crack-cocaine use  Review of Systems       All systems reviewed and negative except as per HPI.   Vital Signs:  Patient profile:   50 year old female Height:      67 inches Weight:      334 pounds BMI:     52.50 Pulse rate:   93 / minute BP sitting:   107 / 69  (left arm)  Vitals Entered By: Laurance Flatten CMA (April 01, 2010 8:12 AM)  Physical Exam  General:  Well developed, well nourished, in no acute distress. Obese.  Neck:  Neck supple, JVP 8-9 cm. No masses, thyromegaly or abnormal cervical nodes. Lungs:  Clear bilaterally to auscultation and percussion. Heart:  Lateral PMI, chest non-tender; regular rate and rhythm, S1, S2 with no murmur noted.  No S3. Carotid upstroke normal, no bruit. Pedals normal pulses. 1+ ankle edema.  Abdomen:  Bowel sounds positive; abdomen soft and non-tender without masses, organomegaly, or hernias noted. No hepatosplenomegaly. Extremities:  No clubbing or cyanosis. Neurologic:  Alert and oriented x 3. Psych:  Normal affect.   Impression & Recommendations:  Problem # 1:  CHRONIC SYSTOLIC HEART FAILURE (ICD-428.22) Creatinine improved but now more volume overloaded.  She does seem to understand her meds better today (what she is on and when she is to take it).  I am going to have her continue torsemide at 80 mg daily and resume once weekly  metolozone 2.5 mg (on Friday, so will take tomorrow).  She can restart spironolactone at 12.5 mg daily but continue to hold losartan for now.  BMET and BNP in 1 week, followup in 2 wks.   Problem # 2:  MITRAL VALVE DISORDERS (ICD-424.0) Most recent Echo no significant MR or MS.    Patient Instructions: 1)  Your physician has recommended you make the following change in your medication:  2)  Take Metolazone(Zaroxlyn) 2.5mg  every Friday 30 minutes before you take Torsemide.Start tomorrow. 3)  Start Spironolactone 12.5mg  daily--this will be one-half of a 25mg  tablet. 4)  Your physician recommends that you return for lab work in: 1 week---BMP/BNP  428.22 5)  Your physician recommends that you schedule a follow-up appointment in: 2 weeks with Dr Shirlee Latch. Prescriptions: SPIRONOLACTONE 25 MG TABS (SPIRONOLACTONE) one-half daily  #15 x 6   Entered by:   Katina Dung, RN, BSN   Authorized by:   [name],  MD   Signed by:   Katina Dung, RN, BSN on 04/01/2010   Method used:   Electronically to        [facility] [phone]* (retail)       [address]       [place], [place]  [address]       Ph: [phone]       Fax: [phone]   RxID:   [id number] ZAROXOLYN 2.5 MG TABS (METOLAZONE) one every Friday--TAKE THIS 30 MINUTES BEFORE YOU TAKE TORSEMIDE  #5 x 6   Entered by:   Katina Dung, RN, BSN   Authorized by:   [name], MD   Signed by:   Katina Dung, RN, BSN on 04/01/2010   Method used:   Electronically to        [facility] [phone]* (retail)       [address]       [place], [place]  [address]       Ph: [phone]       Fax: [phone]   RxID:   [id number]

## 2010-06-17 ENCOUNTER — Telehealth: Payer: Self-pay | Admitting: Cardiology

## 2010-06-17 ENCOUNTER — Encounter: Payer: Self-pay | Admitting: Cardiology

## 2010-06-17 ENCOUNTER — Ambulatory Visit (INDEPENDENT_AMBULATORY_CARE_PROVIDER_SITE_OTHER): Payer: Self-pay | Admitting: Cardiology

## 2010-06-17 ENCOUNTER — Ambulatory Visit: Admit: 2010-06-17 | Payer: Self-pay | Admitting: Cardiology

## 2010-06-17 DIAGNOSIS — I5023 Acute on chronic systolic (congestive) heart failure: Secondary | ICD-10-CM

## 2010-06-17 NOTE — Procedures (Signed)
Summary: Colonoscopy  Patient: [name] Note: All result statuses are Final unless otherwise noted.  Tests: (1) Colonoscopy (COL)   COL Colonoscopy           DONE (C)     [facility]     [address]     [place], [place]  [address]           COLONOSCOPY PROCEDURE REPORT           PATIENT:  [name], [name]  MR#:  [id number]     BIRTHDATE:  Jun 14, 1960, 49 yrs. old  GENDER:  female           ENDOSCOPIST:  Barbette Hair. Arlyce Dice, MD     Referred by:  Dow Adolph, M.D.           PROCEDURE DATE:  05/19/2010     PROCEDURE:  Diagnostic Colonoscopy     ASA CLASS:  Class II     INDICATIONS:  1) Iron deficiency anemia  2) heme positive stool           MEDICATIONS:   Fentanyl 75 mcg IV, Versed 8 mg IV, benadryl 25mg      IV (correction)           DESCRIPTION OF PROCEDURE:   After the risks benefits and     alternatives of the procedure were thoroughly explained, informed     consent was obtained.  Digital rectal exam was performed and     revealed no abnormalities.   The LB [id number] endoscope     was introduced through the anus and advanced to the cecum, which     was identified by the ileocecal valve, without limitations.  The     quality of the prep was good, using MoviPrep.  The instrument was     then slowly withdrawn as the colon was fully examined.     <<PROCEDUREIMAGES>>           FINDINGS:  Mild diverticulosis was found in the sigmoid colon (see     image8).  This was otherwise a normal examination of the colon     (see image2, image4, image5, image6, image7, and image10). Cecum     was examined thoroughly from just beyond the ileocecal valve but     was not fully intubated (unable to do so)   Retroflexed views in     the rectum revealed no abnormalities.    The time to cecum =  7.50     minutes. The scope was then withdrawn (time =  8.75  min) from the     patient and the procedure completed.           COMPLICATIONS:  None        ENDOSCOPIC IMPRESSION:     1) Mild diverticulosis in the sigmoid colon     2) Otherwise normal examination     RECOMMENDATIONS:     1) Upper endoscopy will be scheduled           REPEAT EXAM:  No           ______________________________     Barbette Hair. Arlyce Dice, MD           CC:           n.     REVISED:  05/21/2010 08:12 AM     eSIGNED:   Barbette Hair. Porschea Borys at 05/21/2010 08:12 AM           Wonda Cerise,  [id number]  Note: An exclamation mark (!) indicates a result that was not dispersed into the flowsheet. Document Creation Date: 05/21/2010 8:13 AM _______________________________________________________________________  (1) Order result status: Final Collection or observation date-time: 05/19/2010 16:25 Requested date-time:  Receipt date-time:  Reported date-time:  Referring Physician:   Ordering Physician: Melvia Heaps [phone]) Specimen Source:  Source: Launa Grill Order Number: [id number] Lab site:

## 2010-06-17 NOTE — Miscellaneous (Signed)
Summary: [facility]  Clinical Lists Changes  Allergies: Added new allergy or adverse reaction of MORPHINE

## 2010-06-17 NOTE — Procedures (Signed)
Summary: Capsule Instruction  Capsule Instruction   Imported By: Lester Minkler 06/09/2010 09:13:13  _____________________________________________________________________  External Attachment:    Type:   Image     Comment:   External Document

## 2010-06-17 NOTE — Assessment & Plan Note (Signed)
Summary: Capsule teaching with Libbey Duce/[facility]  Met with patient and instructed her regarding the prep and procedure for capsule endo. Patient verbalized understanding and had no questions. Written instructions given to the patient.

## 2010-06-17 NOTE — Letter (Signed)
Summary: EGD Instructions  [facility]  [address] [place], [place] [address]   Phone: [phone]  Fax: [phone]       [name]    10/25/1960    MRN: [id number]       Procedure Day /Date:  Tuesday 05/25/2010     Arrival Time: 3:00 pm     Procedure Time: 4:00 pm     Location of Procedure:                    _x  _ [facility] (4th Floor)    PREPARATION FOR ENDOSCOPY   On Tuesday 1/10 THE DAY OF THE PROCEDURE:  1.   No solid foods, milk or milk products are allowed after midnight the night before your procedure.  2.   Do not drink anything colored red or purple.  Avoid juices with pulp.  No orange juice.  3.  You may drink clear liquids until 2:00 pm, which is 2 hours before your procedure.                                                                                                CLEAR LIQUIDS INCLUDE: Water Jello Ice Popsicles Tea (sugar ok, no milk/cream) Powdered fruit flavored drinks Coffee (sugar ok, no milk/cream) Gatorade Juice: apple, white grape, white cranberry  Lemonade Clear bullion, consomm, broth Carbonated beverages (any kind) Strained chicken noodle soup Hard Candy   MEDICATION INSTRUCTIONS  Unless otherwise instructed, you should take regular prescription medications with a small sip of water as early as possible the morning of your procedure.   Additional medication instructions: Do not take Torsemide, Zaroxolyn, or Spirononolactone day of procedure.  Stop Iron 3 days before procedure.             OTHER INSTRUCTIONS  You will need a responsible adult at least 49 years of age to accompany you and drive you home.   This person must remain in the waiting room during your procedure.  Wear loose fitting clothing that is easily removed.  Leave jewelry and other valuables at home.  However, you may wish to bring a book to read or an iPod/MP3 player to listen to music as you wait for your procedure  to start.  Remove all body piercing jewelry and leave at home.  Total time from sign-in until discharge is approximately 2-3 hours.  You should go home directly after your procedure and rest.  You can resume normal activities the day after your procedure.  The day of your procedure you should not:   Drive   Make legal decisions   Operate machinery   Drink alcohol   Return to work  You will receive specific instructions about eating, activities and medications before you leave.    The above instructions have been reviewed and explained to me by   Ezra Sites RN  May 21, 2010 4:29 PM     I fully understand and can verbalize these instructions _____________________________ Date _________

## 2010-06-17 NOTE — Procedures (Signed)
Summary: Upper Endoscopy  Patient: [name] Note: All result statuses are Final unless otherwise noted.  Tests: (1) Upper Endoscopy (EGD)   EGD Upper Endoscopy       DONE     [facility]     [address]     [place], [place]  [address]           ENDOSCOPY PROCEDURE REPORT           PATIENT:  Tasha, Lyons  MR#:  [id number]     BIRTHDATE:  June 24, 1960, 50 yrs. old  GENDER:  female           ENDOSCOPIST:  Barbette Hair. Arlyce Dice, MD     Referred by:  Laurey Morale, M.D.           PROCEDURE DATE:  05/25/2010     PROCEDURE:  EGD, diagnostic 43235     ASA CLASS:  Class II     INDICATIONS:  iron deficiency anemia, hemoccult positive stool           MEDICATIONS:   Fentanyl 50 mcg IV, Versed 5 mg PO, glycopyrrolate     (Robinal) 0.2 mg IV, 0.6cc simethancone 0.6 cc PO     TOPICAL ANESTHETIC:  Exactacain Spray           DESCRIPTION OF PROCEDURE:   After the risks benefits and     alternatives of the procedure were thoroughly explained, informed     consent was obtained.  The LB GIF-H180 [id number] endoscope was     introduced through the mouth and advanced to the third portion of     the duodenum, without limitations.  The instrument was slowly     withdrawn as the mucosa was fully examined.     <<PROCEDUREIMAGES>>           The upper, middle, and distal third of the esophagus were     carefully inspected and no abnormalities were noted. The z-line     was well seen at the GEJ. The endoscope was pushed into the fundus     which was normal including a retroflexed view. The antrum,gastric     body, first and second part of the duodenum were unremarkable (see     image1, image2, image3, image4, image5, image6, image7, image8,     and image9).    Retroflexed views revealed no abnormalities.     The scope was then withdrawn from the patient and the procedure     completed.           COMPLICATIONS:  None           ENDOSCOPIC IMPRESSION:     1) Normal EGD  RECOMMENDATIONS:     1) capsule endoscopy           REPEAT EXAM:  No           ______________________________     Barbette Hair. Arlyce Dice, MD           CC:           n.     eSIGNED:   Barbette Hair. Kaplan at 05/25/2010 04:08 PM           Wonda Cerise, [id number]  Note: An exclamation mark (!) indicates a result that was not dispersed into the flowsheet. Document Creation Date: 05/25/2010 4:08 PM _______________________________________________________________________  (1) Order result status: Final Collection or observation date-time: 05/25/2010 16:04 Requested date-time:  Receipt date-time:  Reported date-time:  Referring Physician:  Ordering Physician: Melvia Heaps [phone]) Specimen Source:  Source: Launa Grill Order Number: [id number] Lab site:   Appended Document: Upper Endoscopy Patient scheduled for teaching for 06/04/10 @4pm . Capsule scheduled for 06/09/10 @8am .    Clinical Lists Changes  Orders: Added new Test order of Capsule Endoscopy (Capsule Endoscopy) - Signed

## 2010-06-17 NOTE — Assessment & Plan Note (Signed)
Summary: ANEMIA/RES FROM 10-18/YF    History of Present Illness Visit Type: Initial Consult Primary GI MD: Melvia Heaps MD [facility] Primary Provider: Dow Adolph, MD Requesting Provider: [name], MD  Chief Complaint: GERD, IBS, and weight per pt is going up and down  History of Present Illness:   [name] is a 50yo African American female with a history of peptic ulcer disease referred for evaluation of anemia.  She has a history of peptic ulcer disease and gastritis.  Last endoscopy in June, 2010 demonstrated gastritis and linear ulcers in the gastric cardia.  The patient takes Nexium daily.  In July, 2011 CT scan demonstrated thickening of the right colon consistent with colitis.  I am not aware that she underwent colonoscopy at that time  She has a history of an iron deficiency anemia.  She denies change in bowel habits, pain, melena or hematochezia.  Bowels are somewhat erratic.  Patient has a history of congestive heart failure secondary to mitral valve regurgitation.  She status post valve repair.  She also has sleep apnea, rheumatoid arthritis and is morbidly obese.  She takes supplementary iron.  She has and no gastric irritants including nonsteroidals.   GI Review of Systems    Reports acid reflux and  heartburn.      Denies abdominal pain, belching, bloating, chest pain, dysphagia with liquids, dysphagia with solids, loss of appetite, nausea, vomiting, vomiting blood, weight loss, and  weight gain.      Reports irritable bowel syndrome.     Denies anal fissure, black tarry stools, change in bowel habit, constipation, diarrhea, diverticulosis, fecal incontinence, heme positive stool, hemorrhoids, jaundice, light color stool, liver problems, rectal bleeding, and  rectal pain.    Current Medications (verified): 1)  Percocet 5-325 Mg Tabs (Oxycodone-Acetaminophen) .... Uad 2)  Aspirin 81 Mg Tabs (Aspirin) .... Once Daily 3)  Citalopram Hydrobromide 40 Mg Tabs (Citalopram  Hydrobromide) .... Take One Tablet By Mouth Once Daily 4)  Ventolin Hfa 108 (90 Base) Mcg/act Aers (Albuterol Sulfate) .Marland Kitchen.. 1 Puff As Needed 5)  Carvedilol 6.25 Mg Tabs (Carvedilol) .... Two Times A Day 6)  Ferrex 150 150 Mg Caps (Polysaccharide Iron Complex) .... Once Daily 7)  Alprazolam 0.5 Mg Tabs (Alprazolam) .... Take One Tablet At Bedtime For Sleep 8)  Hydralazine Hcl 25 Mg Tabs (Hydralazine Hcl) .... One and One-Half   Tablets  Three Times A Day 9)  Promethazine Hcl 12.5 Mg Tabs (Promethazine Hcl) .... One Every 6 Hours As Needed For Nausea 10)  Vitamin D (Ergocalciferol) 50000 Unit Caps (Ergocalciferol) .Marland Kitchen.. 1 By Mouth Weekly 11)  Respiridone 4mg  .... Take One Tablet Once Daily 12)  Torsemide 20 Mg Tabs (Torsemide) .... Four Tablets Daily 13)  Zaroxolyn 2.5 Mg Tabs (Metolazone) .... One Every Friday--Take This 30 Minutes Before You Take Torsemide 14)  Klor-Con M20 20 Meq Cr-Tabs (Potassium Chloride Crys Cr) .... One Daily--Take Two On The Days That You Take Metolazone 15)  Isosorbide Mononitrate Cr 60 Mg Xr24h-Tab (Isosorbide Mononitrate) .... One Daily 16)  Nexium 40 Mg Cpdr (Esomeprazole Magnesium) .Marland Kitchen.. 1 Tab Once Daily 17)  Spironolactone 25 Mg Tabs (Spironolactone) .... One-Half Daily 18)  Allopurinol 300 Mg Tabs (Allopurinol) .... Once Daily  Allergies (verified): 1)  ! * Colchicine  Past History:  Past Medical History:  1. Congestive heart failure, most likely secondary to severe mitral regurgitation.   TEE (6/10): EF 40%, diffuse hypokinesis,  mild to moderately dilated LV, severe eccentric MR, small PFO.  Cardiac MRI (6/10): EF 37% with global hypokinesis, moderate to severe MR, no delayed enhancement (no evidence for sarcoidosis or other infiltrative disease).  TTE (10/10) after MV repair: EF 25-30%, moderately dilated LV, moderate diastolic dysfunction, mildly depressed RV function, trivial MR, MV mean gradient 5 mmHg, PASP 30 mmHg.  RHC (12/10): mean RA 19, PA 46/26, mean  PCWP 28, CI 2.5, SVO2 63%.  TTE (2/11): EF 35-40% with diffuse hypokinesis, no significant MR or MS.  TTE (7/11): EF 45%, mild global hypokinesis, s/p MV repair, no mitral regurgitation, minimal mitral stenosis, PA systolic pressure 40 mmHg, mild LV dilation.   2. Severe mitral regurgitation (see above).  Minimally invasive mitral valve repair on 12/18/08.  She additionally had TV repair and PFO closure.   3. Microcytic anemia, secondary to chronic gastric blood loss.   4. Gastric ulcers.   5. Morbid obesity.   6. Obstructive sleep apnea, on CPAP.   7. GERD.   8. Hypertension, controlled.   9. Rheumatoid arthritis  10. Gout.   11. Depression.   12. LHC (6/10): No angiographic CAD.  Mildly dilated LV. 3+ MR.  EF 40%.   13. Prior smoking, quit   14. 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 Blood Clot Right Leg   Past Surgical History: Heart Valve Surgery   Family History: Negative for cardiac or renal disease.  No FH of Colon Cancer:  Social History: Single,5 children.  Unemployed.  Tobacco Use - Yes.  Smoked < 1 ppd, quit 6/10.  Alcohol Use - no Regular Exercise - no Drug Use - no, hx crack-cocaine use Daily Caffeine Use: 2 daily   Review of Systems       The patient complains of anemia, arthritis/joint pain, cough, headaches-new, and muscle pains/cramps.  The patient denies allergy/sinus, anxiety-new, back pain, blood in urine, breast changes/lumps, change in vision, confusion, coughing up blood, depression-new, fainting, fatigue, fever, hearing problems, heart murmur, heart rhythm changes, itching, menstrual pain, night sweats, nosebleeds, pregnancy symptoms, shortness of breath, skin rash, sleeping problems, sore throat, swelling of feet/legs, swollen lymph glands, thirst - excessive , urination - excessive , urination changes/pain, urine leakage, vision changes, and voice change.         All other systems were reviewed and were negative   Vital  Signs:  Patient profile:   50 year old female Height:      67 inches Weight:      335 pounds BMI:     52.66 BSA:     2.52 Pulse rate:   96 / minute Pulse rhythm:   regular BP sitting:   132 / 74  (left arm) Cuff size:   large  Vitals Entered By: Ok Anis CMA (April 16, 2010 2:36 PM)  Physical Exam  Additional Exam:  On physical exam she is an obese female  skin: anicteric HEENT: normocephalic; PEERLA; no nasal or pharyngeal abnormalities neck: supple nodes: no cervical lymphadenopathy chest: clear to ausculatation and percussion heart: no murmurs, gallops, or rubs abd: soft, nontender; BS normoactive; no abdominal masses, tenderness, organomegaly rectal: deferred ext: no cynanosis, clubbing, edema skeletal: no deformities neuro: oriented x 3; no focal abnormalities    Impression & Recommendations:  Problem # 1:  ANEMIA, IRON DEFICIENCY (ICD-280.9)  hemoglobin 9.7 and MCV 74 in September, 2011.  She has a history of peptic ulcer disease although she is taking Nexium.  She does not think that she has undergone colonoscopy.  CT Scan 6  months ago suggested a right-sided colitis.  Recommendations #1colonoscopy #2 to consider repeat upper endoscopy if colonoscopy is not diagnostic for a [place] source #3 Hemoccult  Orders: Colonoscopy (Colon)  Problem # 2:  CHRONIC SYSTOLIC HEART FAILURE (ICD-428.22) Assessment: Comment Only  Patient Instructions: 1)  Copy sent to : Tasha Adolph, MD 2)  You will go to the basement today for your hemoccult kit 3)  Your colonoscopy is scheduled on 05/19/2010 at 4pm on the 4th floor 4)  You can pick up your MoviPrep today 5)  The medication list was reviewed and reconciled.  All changed / newly prescribed medications were explained.  A complete medication list was provided to the patient / caregiver. Prescriptions: MOVIPREP 100 GM  SOLR (PEG-KCL-NACL-NASULF-NA ASC-C) As per prep instructions.  #1 x 0   Entered by:   [name] CMA (AAMA)   Authorized by:   Louis Meckel MD   Signed by:   [name] CMA (AAMA) on 04/16/2010   Method used:   Electronically to        [facility] [phone]* (retail)       [address]       [place], [place]  [address]       Ph: [phone]       Fax: [phone]   RxID:   [id number]

## 2010-06-17 NOTE — Assessment & Plan Note (Signed)
Summary: Capsule endo/[facility]  Patient here today for capsule endoscopy for Dr. Arlyce Dice. Patient verbalized understanding of all verbal and written instructions. Patient tolerated well. Lot # [id number] exp:2013/01                     25

## 2010-06-23 NOTE — Assessment & Plan Note (Signed)
Summary: 2 MONTH? [name]/RS FROM BUMPLIST/GD/AMD UNABLE TO CONFIRM .Tasha KitchenMarland Lyons   Vital Signs:  Patient profile:   50 year old female Height:      67 inches Weight:      341.50 pounds Pulse rate:   84 / minute BP sitting:   114 / 70  (right arm) Cuff size:   large  Vitals Entered By: Katina Dung, RN, BSN (June 17, 2010 2:57 PM)  Primary Provider:  Dow Adolph, MD   History of Present Illness: 50 yo with systolic CHF probably secondary to severe MR now s/p MV repair, rheumatoid arthritis, and PUD presents for followup.  Patient seems to be doing better from a CHF standpoint.  She is not short of breath walking on flat ground and is more limited by her chronic back and knee pain.  Weight continues to rise.  I suspect that this is mainly due to her inactivity.  Creatinine has remained stable.  Last echo showed improvement in EF to 45%.   Labs (12/23/09) K 4.4, creatinine 1.1, BNP 145 Labs (9/11): K 3.9, creatinine 1.0, BNP 84, LFTs normal, HCT 29.3 Labs (10/11): K 4.0, creatinine 1.55 => 1.2, uric acid 13, TSH normal, BNP 75 => 172 Labs (11/11): BNP 110 => 105, creatinine 3.6 => 1.2 => 1.5, K 3.7 Labs (1/12): K 3.5, creatinine 1.2, BNP 70  Current Medications (verified): 1)  Percocet 5-325 Mg Tabs (Oxycodone-Acetaminophen) .... Uad 2)  Aspirin 81 Mg Tabs (Aspirin) .... Once Daily 3)  Citalopram Hydrobromide 40 Mg Tabs (Citalopram Hydrobromide) .... Take One Tablet By Mouth Once Daily 4)  Ventolin Hfa 108 (90 Base) Mcg/act Aers (Albuterol Sulfate) .[name].. 1 Puff As Needed 5)  Carvedilol 6.25 Mg Tabs (Carvedilol) .... Two Times A Day 6)  Ferrex 150 150 Mg Caps (Polysaccharide Iron Complex) .... Once Daily 7)  Alprazolam 0.5 Mg Tabs (Alprazolam) .... Take One Tablet At Bedtime For Sleep 8)  Hydralazine Hcl 25 Mg Tabs (Hydralazine Hcl) .... One and One-Half   Tablets  Three Times A Day 9)  Promethazine Hcl 12.5 Mg Tabs (Promethazine Hcl) .... One Every 6 Hours As Needed For Nausea 10)   Vitamin D (Ergocalciferol) 50000 Unit Caps (Ergocalciferol) .[name].. 1 By Mouth Weekly 11)  Respiridone 4mg  .... Take One Tablet Once Daily 12)  Torsemide 20 Mg Tabs (Torsemide) .... Four Tablets Daily 13)  Zaroxolyn 2.5 Mg Tabs (Metolazone) .... One Every Friday--Take This 30 Minutes Before You Take Torsemide 14)  Klor-Con M20 20 Meq Cr-Tabs (Potassium Chloride Crys Cr) .... One Daily--Take Two On The Days That You Take Metolazone 15)  Isosorbide Mononitrate Cr 60 Mg Xr24h-Tab (Isosorbide Mononitrate) .... One Daily 16)  Nexium 40 Mg Cpdr (Esomeprazole Magnesium) .[name].. 1 Tab Once Daily 17)  Spironolactone 25 Mg Tabs (Spironolactone) .... One-Half Daily 18)  Allopurinol 300 Mg Tabs (Allopurinol) .... Once Daily  Allergies: 1)  ! * Colchicine 2)  ! Morphine  Past History:  Past Medical History:  1. Congestive heart failure, most likely secondary to severe mitral regurgitation.   TEE (6/10): EF 40%, diffuse hypokinesis,  mild to moderately dilated LV, severe eccentric MR, small PFO.  Cardiac MRI (6/10): EF 37% with global hypokinesis, moderate to severe MR, no delayed enhancement (no evidence for sarcoidosis or other infiltrative disease).  TTE (10/10) after MV repair: EF 25-30%, moderately dilated LV, moderate diastolic dysfunction, mildly depressed RV function, trivial MR, MV mean gradient 5 mmHg, PASP 30 mmHg.  RHC (12/10): mean RA 19, PA 46/26,  mean PCWP 28, CI 2.5, SVO2 63%.  TTE (2/11): EF 35-40% with diffuse hypokinesis, no significant MR or MS.  TTE (7/11): EF 45%, mild global hypokinesis, s/p MV repair, no mitral regurgitation, minimal mitral stenosis, PA systolic pressure 40 mmHg, mild LV dilation.  2. Severe mitral regurgitation (see above).  Minimally invasive mitral valve repair on 12/18/08.  She additionally had TV repair and PFO closure.  3. Microcytic anemia: EGD and colonoscopy in 12/11 did not reveal source of bleeding.  Plan capsule endoscopy.  4. Gastric ulcers.  5. Morbid obesity.    6. Obstructive sleep apnea, on CPAP.  7. GERD.  8. Hypertension, controlled.  9. Rheumatoid arthritis 10. Gout.  11. Depression.  12. LHC (6/10): No angiographic CAD.  Mildly dilated LV. 3+ MR.  EF 40%.  13. Prior smoking, quit  14. Colitis (2/11) 15. Auditory hallucinations,  on Risperdal 16. History of right leg DVT  Family History: Reviewed history from 04/16/2010 and no changes required. Negative for cardiac or renal disease.  No FH of Colon Cancer:  Social History: Reviewed history from 04/16/2010 and no changes required. Single,5 children.  Unemployed.  Tobacco Use - Yes.  Smoked < 1 ppd, quit 6/10.  Alcohol Use - no Regular Exercise - no Drug Use - no, hx crack-cocaine use Daily Caffeine Use: 2 daily   Review of Systems       All systems reviewed and negative except as per HPI.  Physical Exam  General:  Well developed, well nourished, in no acute distress. Obese.  Neck:  Neck supple, no JVD. No masses, thyromegaly or abnormal cervical nodes. Lungs:  Clear bilaterally to auscultation and percussion. Heart:  Lateral PMI, chest non-tender; regular rate and rhythm, S1, S2 with no murmur noted.  No S3. Carotid upstroke normal, no bruit. Pedals normal pulses. 1+ edema 1/2 up lower legs bilaterally.  Abdomen:  Bowel sounds positive; abdomen soft and non-tender without masses, organomegaly, or hernias noted. No hepatosplenomegaly. Extremities:  No clubbing or cyanosis. Neurologic:  Alert and oriented x 3. Psych:  Normal affect.   Impression & Recommendations:  Problem # 1:  CHRONIC SYSTOLIC HEART FAILURE (ICD-428.22) Improved symptoms.  Last EF also improved to 45%.  Volume status looks better than at last appointment.  I will continue her on current doses of Coreg, hydralazine, Imdur, and spironolactone.  Creatinine is back to 1.2.  I will go ahead and restart her on losartan 25 mg two times a day.  Continue torsemide and metolozone at current doses. BMET/BNP in 2 wks.    Problem # 2:  MITRAL VALVE DISORDERS (ICD-424.0) Most recent Echo no significant MR or MS.    Problem # 3:  OBESITY NOS (ICD-278.00) I will refer her to the bariatric clinic.  She may benefit from lap banding.   Other Orders: Misc. Referral (Misc. Ref)  Patient Instructions: 1)  Your physician has recommended you make the following change in your medication:  2)  Start Losartan 25mg  twice a day. 3)  Lab in 1 week-you should be fasting--BMP/HGB A1C/lipid profile/CBC with Diff--copy  to Dr Grace Blight [phone]  fax [phone] 4)  Referral to Bariatric Center for consideration for lap band surgery 5)  Your physician wants you to follow-up in: 3 months with Dr Shirlee Latch.(MAY 2012)   You will receive a reminder letter in the mail two months in advance. If you don't receive a letter, please call our office to schedule the follow-up appointment. Prescriptions: LOSARTAN POTASSIUM 25 MG TABS (LOSARTAN  POTASSIUM) one twice a day  #60 x 6   Entered by:   Katina Dung, RN, BSN   Authorized by:   [name], MD   Signed by:   Katina Dung, RN, BSN on 06/17/2010   Method used:   Electronically to        [facility] [phone]* (retail)       [address]       [place], [place]  [address]       Ph: [phone]       Fax: [phone]   RxID:   [id number]    Orders Added: 1)  Misc. Referral [Misc. Ref]

## 2010-06-23 NOTE — Progress Notes (Signed)
Summary: referral to Bariatric Center   ---- Converted from flag ---- ---- 06/17/2010 3:53 PM, Omar Person wrote: pt was giving number to sched. class with the center.  [phone] Omar Person  June 17, 2010 3:52 PM  ---- 06/17/2010 3:42 PM, Katina Dung, RN, BSN wrote: The following orders have been entered for this patient and placed on Admin Hold:  Type:     Referral       Code:   Misc. Ref Description:   Misc. Referral Order Date:   06/17/2010   Authorized By:   [name], MD Order #:   [id number] Clinical Notes:   Type of Referral: referral to Bariatric Center Reason: consideration for lap-band surgery ------------------------------

## 2010-06-24 IMAGING — CT CT ABD-PELV W/O CM
1 of 4 series · 15 of 36 positions shown, 19 images · IV contrast (water/omni)
Comparison: Ultrasound earlier today

CLINICAL DATA: Abdominal pain, vomiting.

CT ABDOMEN AND PELVIS WITHOUT CONTRAST
TECHNIQUE: Multidetector CT imaging of the abdomen and pelvis was
performed following the standard protocol without intravenous
contrast.

[Series 2: routine abdomen · axial · 0.95mm/px · z∈[-441,-21]mm · 15 of 92 slices shown, 19 images]
[im 4/92  soft-tissue]
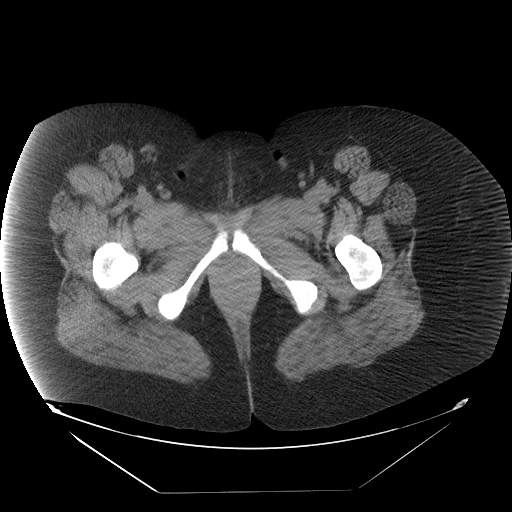
[im 4/92  bone]
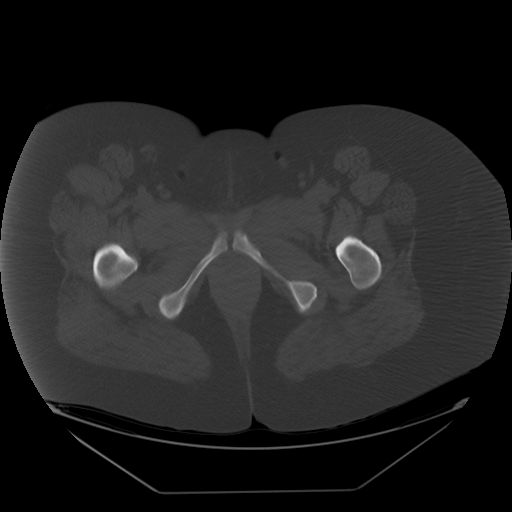
[im 12/92  soft-tissue]
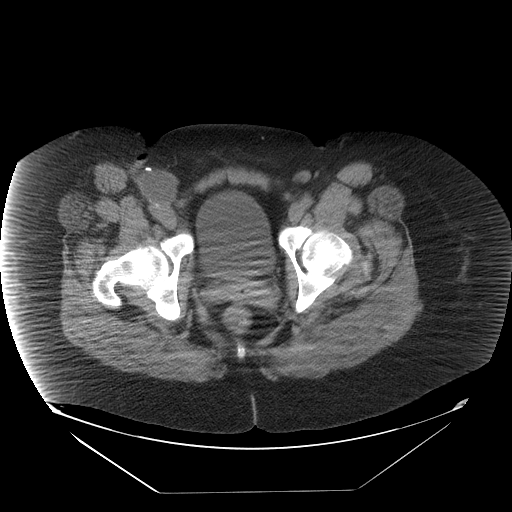
[im 20/92  soft-tissue]
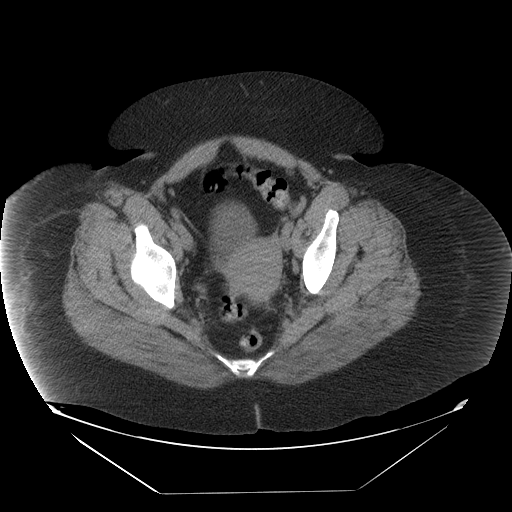
[im 24/92  soft-tissue]
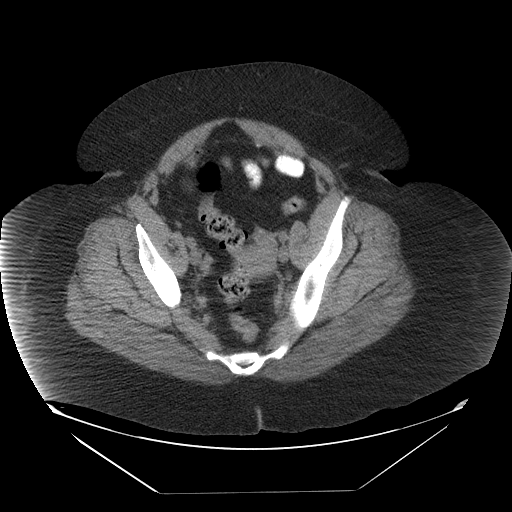
[im 32/92  soft-tissue]
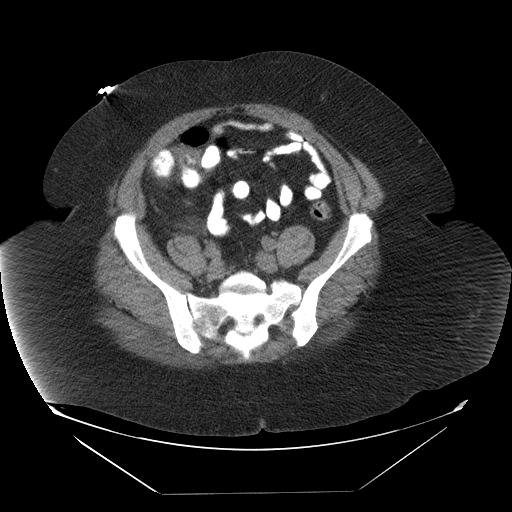
[im 40/92  soft-tissue]
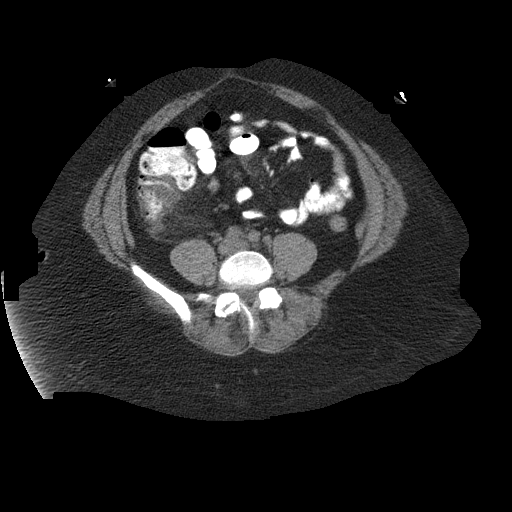
[im 48/92  soft-tissue]
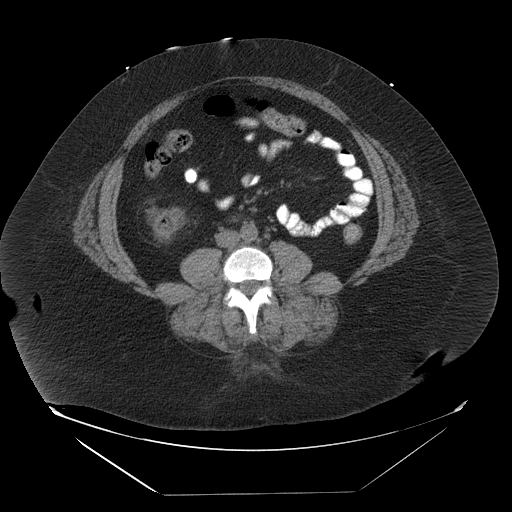
[im 52/92  soft-tissue]
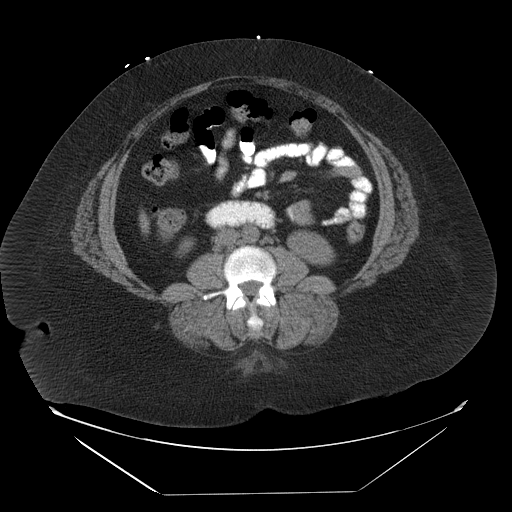
[im 60/92  soft-tissue]
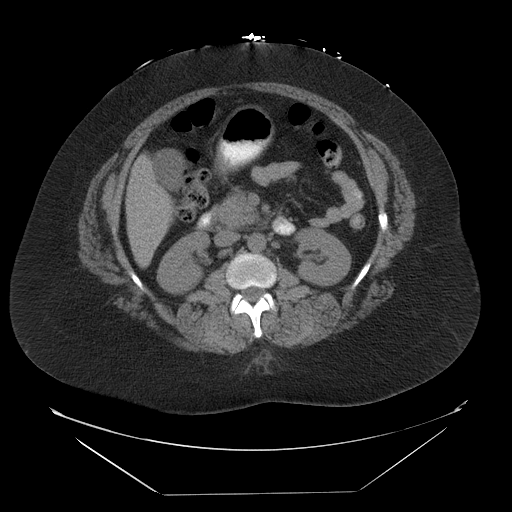
[im 60/92  bone]
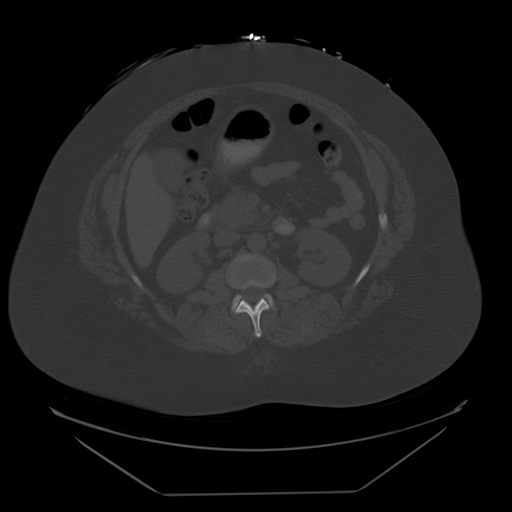
[im 68/92  soft-tissue]
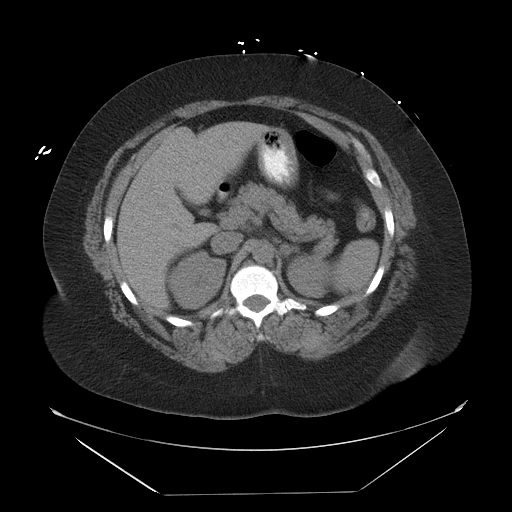
[im 72/92  soft-tissue]
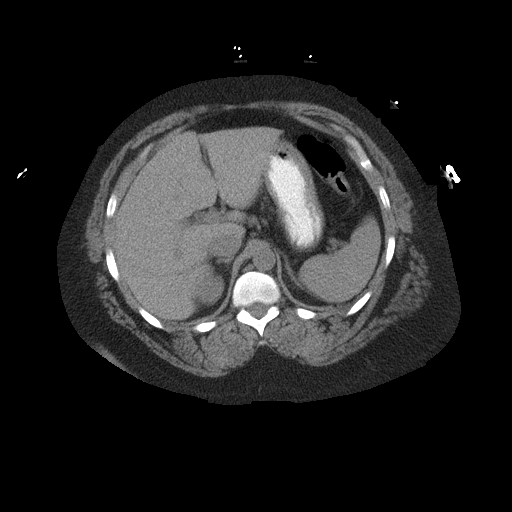
[im 76/92  lung]
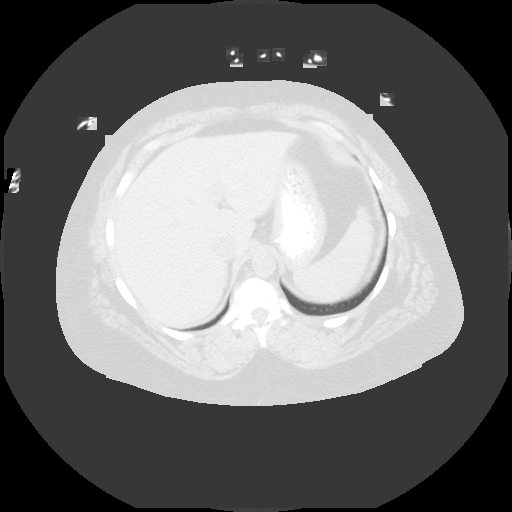
[im 80/92  soft-tissue]
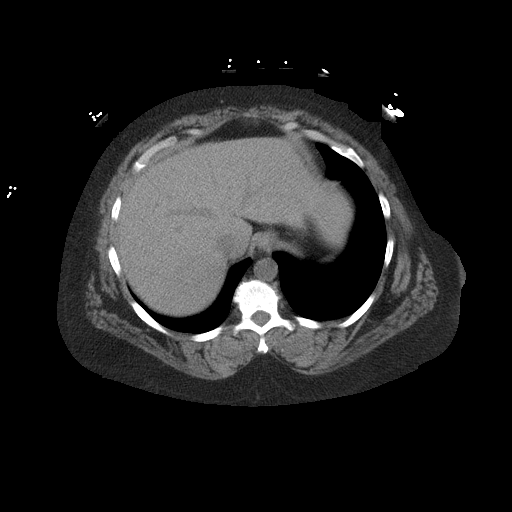
[im 80/92  lung]
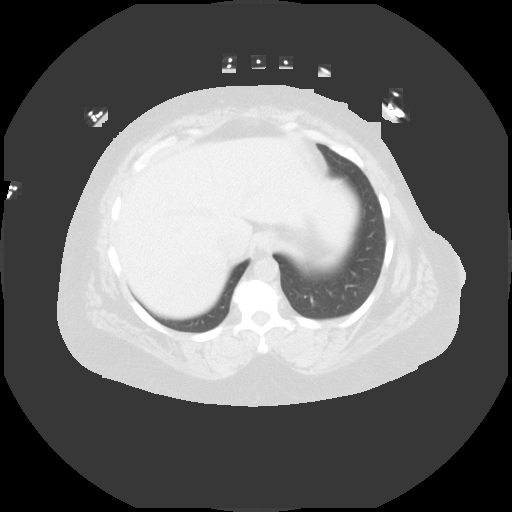
[im 84/92  lung]
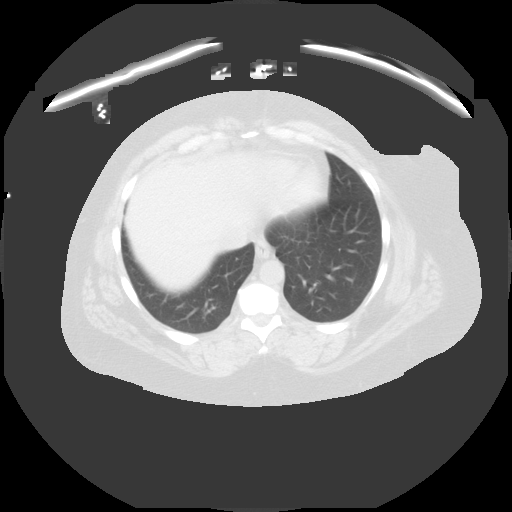
[im 88/92  soft-tissue]
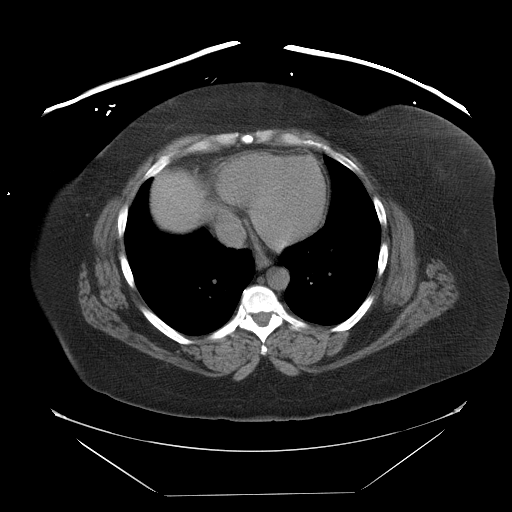
[im 88/92  lung]
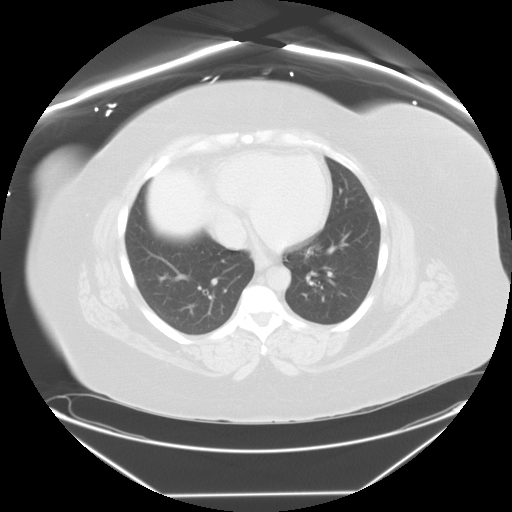

[15 of 36 positions shown; findings below may reference images not displayed]

FINDINGS: Lung bases clear.  No effusions.  Mitral and tricuspid
valve calcifications present.

Abdomen:  Gallbladder, liver, spleen, pancreas, adrenals, kidneys
unremarkable.

Mild haziness adjacent to the cecum and ascending colon medially.
The appendix is normal.  Small bowel unremarkable.  No free fluid
or free air.

Pelvis:  Uterus and adnexa unremarkable.  Bladder unremarkable.
Bowel grossly unremarkable.  No free fluid, free air, or
adenopathy.
IMPRESSION: Stranding adjacent to the cecum and ascending colon, compatible
with colitis.

Appendix normal.

## 2010-06-25 ENCOUNTER — Other Ambulatory Visit: Payer: Self-pay

## 2010-06-29 ENCOUNTER — Other Ambulatory Visit: Payer: Self-pay

## 2010-07-01 NOTE — Procedures (Signed)
Summary: CAPSULE ENDOSCOPY REPORT   ORDERED BY: Melvia Heaps, MD READ BY: Melvia Heaps, MD   REASON FOR REFERRAL: 49YO FEMALE WITH FE DEFIENCY ANEMAI, HEME POSITIVE STOOL. RECENT NEGATIVE EGD AND COLONOSCOPY.  PATEINT DATA: Height: 67.0 inches, Weight: 334 lbs. Waist: 0.0 inches. Build: Normal. Gastric Passage Time: 0h 79m. Small Bowel Passage Time: 1h 85m.  PROCEDURE INFO & FINDINGS: 1)COMPLETE EXAM, GOOD PREP 2)RAPID TRANSIT- CAPSULE REACHED THE COLON IN 1 HOUR 48 MIN. 3) ONE GASTRIC EROSION 4) 2 SMALL COLONIC AVM'S  SUMMARY & RECOMMENDATIONS: STOP ASA USE IF OK WITH CARDIOLOGIST.

## 2010-07-02 ENCOUNTER — Other Ambulatory Visit: Payer: Medicaid Other

## 2010-07-02 ENCOUNTER — Encounter: Payer: Self-pay | Admitting: Cardiology

## 2010-07-05 ENCOUNTER — Other Ambulatory Visit: Payer: Self-pay

## 2010-07-05 ENCOUNTER — Encounter (INDEPENDENT_AMBULATORY_CARE_PROVIDER_SITE_OTHER): Payer: Self-pay | Admitting: *Deleted

## 2010-07-05 ENCOUNTER — Other Ambulatory Visit (INDEPENDENT_AMBULATORY_CARE_PROVIDER_SITE_OTHER): Payer: Medicaid Other

## 2010-07-05 DIAGNOSIS — Z79899 Other long term (current) drug therapy: Secondary | ICD-10-CM

## 2010-07-05 DIAGNOSIS — I5022 Chronic systolic (congestive) heart failure: Secondary | ICD-10-CM

## 2010-07-05 LAB — CBC WITH DIFFERENTIAL/PLATELET
Basophils Absolute: 0 10*3/uL (ref 0.0–0.1)
Basophils Relative: 0.3 % (ref 0.0–3.0)
Eosinophils Absolute: 0.2 10*3/uL (ref 0.0–0.7)
Hemoglobin: 10.2 g/dL — ABNORMAL LOW (ref 12.0–15.0)
Lymphs Abs: 0.7 10*3/uL (ref 0.7–4.0)
MCHC: 32.3 g/dL (ref 30.0–36.0)
MCV: 77.1 fl — ABNORMAL LOW (ref 78.0–100.0)
Monocytes Absolute: 0.6 10*3/uL (ref 0.1–1.0)
Neutro Abs: 3.5 10*3/uL (ref 1.4–7.7)
RBC: 4.11 Mil/uL (ref 3.87–5.11)
RDW: 19.1 % — ABNORMAL HIGH (ref 11.5–14.6)

## 2010-07-05 LAB — BASIC METABOLIC PANEL
CO2: 34 mEq/L — ABNORMAL HIGH (ref 19–32)
Chloride: 95 mEq/L — ABNORMAL LOW (ref 96–112)
Glucose, Bld: 104 mg/dL — ABNORMAL HIGH (ref 70–99)
Sodium: 137 mEq/L (ref 135–145)

## 2010-07-05 LAB — HEMOGLOBIN A1C: Hgb A1c MFr Bld: 7.3 % — ABNORMAL HIGH (ref 4.6–6.5)

## 2010-07-05 LAB — LIPID PANEL: Total CHOL/HDL Ratio: 4

## 2010-07-13 ENCOUNTER — Other Ambulatory Visit: Payer: Medicaid Other

## 2010-07-28 LAB — LIPID PANEL
HDL: 47 mg/dL (ref >39)
Total CHOL/HDL Ratio: 3.3 RATIO
VLDL: 15 mg/dL (ref 0–40)

## 2010-07-28 LAB — CBC
HCT: 30 % — ABNORMAL LOW (ref 36.0–46.0)
Hemoglobin: 9.7 g/dL — ABNORMAL LOW (ref 12.0–15.0)
MCH: 23.4 pg — ABNORMAL LOW (ref 26.0–34.0)
MCH: 23.5 pg — ABNORMAL LOW (ref 26.0–34.0)
MCH: 24.4 pg — ABNORMAL LOW (ref 26.0–34.0)
MCV: 74.9 fL — ABNORMAL LOW (ref 78.0–100.0)
MCV: 76.3 fL — ABNORMAL LOW (ref 78.0–100.0)
Platelets: 271 10*3/uL (ref 150–400)
Platelets: 288 10*3/uL (ref 150–400)
RBC: 3.93 MIL/uL (ref 3.87–5.11)
RBC: 4.09 MIL/uL (ref 3.87–5.11)
RBC: 4.14 MIL/uL (ref 3.87–5.11)
WBC: 3.2 10*3/uL — ABNORMAL LOW (ref 4.0–10.5)
WBC: 4.5 10*3/uL (ref 4.0–10.5)

## 2010-07-28 LAB — POCT I-STAT, CHEM 8
Calcium, Ion: 1.19 mmol/L (ref 1.12–1.32)
Chloride: 97 mEq/L (ref 96–112)
HCT: 32 % — ABNORMAL LOW (ref 36.0–46.0)
Potassium: 3.6 mEq/L (ref 3.5–5.1)
Sodium: 138 mEq/L (ref 135–145)

## 2010-07-28 LAB — CK TOTAL AND CKMB (NOT AT ARMC): Total CK: 198 U/L — ABNORMAL HIGH (ref 7–177)

## 2010-07-28 LAB — BASIC METABOLIC PANEL
CO2: 30 mEq/L (ref 19–32)
CO2: 32 mEq/L (ref 19–32)
Calcium: 9.5 mg/dL (ref 8.4–10.5)
Chloride: 93 mEq/L — ABNORMAL LOW (ref 96–112)
Chloride: 95 mEq/L — ABNORMAL LOW (ref 96–112)
Creatinine, Ser: 1.55 mg/dL — ABNORMAL HIGH (ref 0.4–1.2)
GFR calc Af Amer: 42 mL/min — ABNORMAL LOW (ref >60)
GFR calc Af Amer: 43 mL/min — ABNORMAL LOW (ref >60)
Potassium: 4 mEq/L (ref 3.5–5.1)
Sodium: 136 mEq/L (ref 135–145)

## 2010-07-28 LAB — DIFFERENTIAL
Basophils Absolute: 0 10*3/uL (ref 0.0–0.1)
Basophils Relative: 0 % (ref 0–1)
Eosinophils Absolute: 0.3 10*3/uL (ref 0.0–0.7)
Monocytes Relative: 10 % (ref 3–12)
Neutrophils Relative %: 59 % (ref 43–77)

## 2010-07-28 LAB — COMPREHENSIVE METABOLIC PANEL
ALT: 33 U/L (ref 0–35)
AST: 24 U/L (ref 0–37)
Albumin: 3.4 g/dL — ABNORMAL LOW (ref 3.5–5.2)
CO2: 33 mEq/L — ABNORMAL HIGH (ref 19–32)
Calcium: 9.4 mg/dL (ref 8.4–10.5)
Chloride: 94 mEq/L — ABNORMAL LOW (ref 96–112)
GFR calc Af Amer: 45 mL/min — ABNORMAL LOW (ref >60)
Glucose, Bld: 128 mg/dL — ABNORMAL HIGH (ref 70–99)
Potassium: 3.7 mEq/L (ref 3.5–5.1)
Sodium: 135 mEq/L (ref 135–145)
Total Bilirubin: 0.6 mg/dL (ref 0.3–1.2)
Total Protein: 8.1 g/dL (ref 6.0–8.3)

## 2010-07-28 LAB — CARDIAC PANEL(CRET KIN+CKTOT+MB+TROPI)
CK, MB: 2.1 ng/mL (ref 0.3–4.0)
Total CK: 161 U/L (ref 7–177)
Total CK: 174 U/L (ref 7–177)
Troponin I: <0.01 ng/mL (ref 0.00–0.06)

## 2010-07-28 LAB — TSH: TSH: 1.68 u[IU]/mL (ref 0.350–4.500)

## 2010-07-29 LAB — DIFFERENTIAL
Eosinophils Absolute: 0.2 10*3/uL (ref 0.0–0.7)
Eosinophils Relative: 5 % (ref 0–5)
Lymphocytes Relative: 27 % (ref 12–46)
Lymphs Abs: 1.1 10*3/uL (ref 0.7–4.0)
Monocytes Relative: 9 % (ref 3–12)

## 2010-07-29 LAB — URINALYSIS, ROUTINE W REFLEX MICROSCOPIC
Bilirubin Urine: NEGATIVE
Glucose, UA: NEGATIVE mg/dL
Ketones, ur: 15 mg/dL — AB
Nitrite: NEGATIVE
Protein, ur: 30 mg/dL — AB
Specific Gravity, Urine: 1.018 (ref 1.005–1.030)
Urobilinogen, UA: 1 mg/dL (ref 0.0–1.0)
pH: 5.5 (ref 5.0–8.0)

## 2010-07-29 LAB — ETHANOL: Alcohol, Ethyl (B): <5 mg/dL (ref 0–10)

## 2010-07-29 LAB — BASIC METABOLIC PANEL
CO2: 29 mEq/L (ref 19–32)
Chloride: 106 mEq/L (ref 96–112)
Creatinine, Ser: 0.81 mg/dL (ref 0.4–1.2)
GFR calc Af Amer: >60 mL/min (ref >60)
Potassium: 3.6 mEq/L (ref 3.5–5.1)
Sodium: 139 mEq/L (ref 135–145)

## 2010-07-29 LAB — URINE MICROSCOPIC-ADD ON

## 2010-07-29 LAB — RAPID URINE DRUG SCREEN, HOSP PERFORMED
Barbiturates: NOT DETECTED
Opiates: POSITIVE — AB

## 2010-07-29 LAB — CBC
Hemoglobin: 9.6 g/dL — ABNORMAL LOW (ref 12.0–15.0)
MCH: 23.4 pg — ABNORMAL LOW (ref 26.0–34.0)
MCV: 75.6 fL — ABNORMAL LOW (ref 78.0–100.0)
Platelets: 290 10*3/uL (ref 150–400)
RBC: 4.1 MIL/uL (ref 3.87–5.11)
WBC: 4.1 10*3/uL (ref 4.0–10.5)

## 2010-08-01 LAB — CBC
HCT: 26.9 % — ABNORMAL LOW (ref 36.0–46.0)
HCT: 27.8 % — ABNORMAL LOW (ref 36.0–46.0)
HCT: 29.5 % — ABNORMAL LOW (ref 36.0–46.0)
Hemoglobin: 8.8 g/dL — ABNORMAL LOW (ref 12.0–15.0)
Hemoglobin: 8.9 g/dL — ABNORMAL LOW (ref 12.0–15.0)
MCH: 24 pg — ABNORMAL LOW (ref 26.0–34.0)
MCHC: 32.5 g/dL (ref 30.0–36.0)
MCHC: 32.6 g/dL (ref 30.0–36.0)
MCV: 74.1 fL — ABNORMAL LOW (ref 78.0–100.0)
MCV: 74.6 fL — ABNORMAL LOW (ref 78.0–100.0)
MCV: 74.6 fL — ABNORMAL LOW (ref 78.0–100.0)
Platelets: 334 10*3/uL (ref 150–400)
RBC: 3.73 MIL/uL — ABNORMAL LOW (ref 3.87–5.11)
RBC: 3.96 MIL/uL (ref 3.87–5.11)
RDW: 17.4 % — ABNORMAL HIGH (ref 11.5–15.5)
RDW: 18 % — ABNORMAL HIGH (ref 11.5–15.5)
WBC: 4 10*3/uL (ref 4.0–10.5)
WBC: 4.3 10*3/uL (ref 4.0–10.5)
WBC: 4.5 10*3/uL (ref 4.0–10.5)
WBC: 5.2 10*3/uL (ref 4.0–10.5)

## 2010-08-01 LAB — DIFFERENTIAL
Basophils Absolute: 0 10*3/uL (ref 0.0–0.1)
Eosinophils Absolute: 0.2 10*3/uL (ref 0.0–0.7)
Eosinophils Absolute: 0.3 10*3/uL (ref 0.0–0.7)
Eosinophils Relative: 4 % (ref 0–5)
Lymphocytes Relative: 13 % (ref 12–46)
Lymphocytes Relative: 26 % (ref 12–46)
Lymphs Abs: 0.7 10*3/uL (ref 0.7–4.0)
Monocytes Absolute: 0.4 10*3/uL (ref 0.1–1.0)
Monocytes Relative: 9 % (ref 3–12)
Neutro Abs: 2 10*3/uL (ref 1.7–7.7)
Neutrophils Relative %: 47 % (ref 43–77)

## 2010-08-01 LAB — COMPREHENSIVE METABOLIC PANEL
ALT: 36 U/L — ABNORMAL HIGH (ref 0–35)
AST: 33 U/L (ref 0–37)
Albumin: 3.1 g/dL — ABNORMAL LOW (ref 3.5–5.2)
CO2: 27 mEq/L (ref 19–32)
Chloride: 105 mEq/L (ref 96–112)
GFR calc Af Amer: >60 mL/min (ref >60)
GFR calc non Af Amer: >60 mL/min (ref >60)
Sodium: 138 mEq/L (ref 135–145)
Total Bilirubin: 0.6 mg/dL (ref 0.3–1.2)

## 2010-08-01 LAB — BASIC METABOLIC PANEL
BUN: 19 mg/dL (ref 6–23)
BUN: 32 mg/dL — ABNORMAL HIGH (ref 6–23)
BUN: 35 mg/dL — ABNORMAL HIGH (ref 6–23)
CO2: 28 mEq/L (ref 19–32)
Calcium: 9 mg/dL (ref 8.4–10.5)
Calcium: 9.2 mg/dL (ref 8.4–10.5)
Calcium: 9.2 mg/dL (ref 8.4–10.5)
Calcium: 9.5 mg/dL (ref 8.4–10.5)
Chloride: 100 mEq/L (ref 96–112)
Chloride: 100 mEq/L (ref 96–112)
Chloride: 106 mEq/L (ref 96–112)
Creatinine, Ser: 1.06 mg/dL (ref 0.4–1.2)
GFR calc Af Amer: >60 mL/min (ref >60)
GFR calc Af Amer: >60 mL/min (ref >60)
GFR calc Af Amer: >60 mL/min (ref >60)
GFR calc Af Amer: >60 mL/min (ref >60)
GFR calc Af Amer: >60 mL/min (ref >60)
GFR calc non Af Amer: 45 mL/min — ABNORMAL LOW (ref >60)
GFR calc non Af Amer: 55 mL/min — ABNORMAL LOW (ref >60)
GFR calc non Af Amer: 59 mL/min — ABNORMAL LOW (ref >60)
GFR calc non Af Amer: >60 mL/min (ref >60)
GFR calc non Af Amer: >60 mL/min (ref >60)
GFR calc non Af Amer: >60 mL/min (ref >60)
Glucose, Bld: 109 mg/dL — ABNORMAL HIGH (ref 70–99)
Glucose, Bld: 113 mg/dL — ABNORMAL HIGH (ref 70–99)
Glucose, Bld: 116 mg/dL — ABNORMAL HIGH (ref 70–99)
Glucose, Bld: 123 mg/dL — ABNORMAL HIGH (ref 70–99)
Potassium: 3.6 mEq/L (ref 3.5–5.1)
Potassium: 3.9 mEq/L (ref 3.5–5.1)
Potassium: 4 mEq/L (ref 3.5–5.1)
Potassium: 4.1 mEq/L (ref 3.5–5.1)
Potassium: 4.1 mEq/L (ref 3.5–5.1)
Potassium: 4.2 mEq/L (ref 3.5–5.1)
Sodium: 135 mEq/L (ref 135–145)
Sodium: 135 mEq/L (ref 135–145)
Sodium: 138 mEq/L (ref 135–145)
Sodium: 139 mEq/L (ref 135–145)
Sodium: 139 mEq/L (ref 135–145)

## 2010-08-01 LAB — CARDIAC PANEL(CRET KIN+CKTOT+MB+TROPI)
CK, MB: 1.3 ng/mL (ref 0.3–4.0)
CK, MB: 1.5 ng/mL (ref 0.3–4.0)
Relative Index: 1.4 (ref 0.0–2.5)
Relative Index: INVALID (ref 0.0–2.5)
Total CK: 104 U/L (ref 7–177)
Total CK: 73 U/L (ref 7–177)

## 2010-08-01 LAB — GLUCOSE, CAPILLARY

## 2010-08-01 LAB — IRON AND TIBC
Iron: 41 ug/dL — ABNORMAL LOW (ref 42–135)
TIBC: 281 ug/dL (ref 250–470)
UIBC: 240 ug/dL

## 2010-08-01 LAB — URINALYSIS, ROUTINE W REFLEX MICROSCOPIC
Glucose, UA: NEGATIVE mg/dL
Hgb urine dipstick: NEGATIVE
Protein, ur: NEGATIVE mg/dL
pH: 5.5 (ref 5.0–8.0)

## 2010-08-01 LAB — APTT: aPTT: 30 seconds (ref 24–37)

## 2010-08-01 LAB — CK TOTAL AND CKMB (NOT AT ARMC): CK, MB: 1.3 ng/mL (ref 0.3–4.0)

## 2010-08-01 LAB — BRAIN NATRIURETIC PEPTIDE
Pro B Natriuretic peptide (BNP): 298 pg/mL — ABNORMAL HIGH (ref 0.0–100.0)
Pro B Natriuretic peptide (BNP): 357 pg/mL — ABNORMAL HIGH (ref 0.0–100.0)

## 2010-08-01 LAB — POCT I-STAT 3, ART BLOOD GAS (G3+)
Patient temperature: 98.7
pH, Arterial: 7.426 — ABNORMAL HIGH (ref 7.350–7.400)

## 2010-08-01 LAB — FOLATE RBC: RBC Folate: 1341 ng/mL — ABNORMAL HIGH (ref 180–600)

## 2010-08-01 LAB — POCT CARDIAC MARKERS

## 2010-08-01 LAB — FERRITIN: Ferritin: 65 ng/mL (ref 10–291)

## 2010-08-04 LAB — URINALYSIS, ROUTINE W REFLEX MICROSCOPIC
Bilirubin Urine: NEGATIVE
Ketones, ur: NEGATIVE mg/dL
Nitrite: NEGATIVE
Protein, ur: NEGATIVE mg/dL
pH: 6 (ref 5.0–8.0)

## 2010-08-04 LAB — POCT PREGNANCY, URINE: Preg Test, Ur: NEGATIVE

## 2010-08-04 LAB — BASIC METABOLIC PANEL
BUN: 9 mg/dL (ref 6–23)
CO2: 26 mEq/L (ref 19–32)
Chloride: 105 mEq/L (ref 96–112)
Chloride: 107 mEq/L (ref 96–112)
Creatinine, Ser: 1.18 mg/dL (ref 0.4–1.2)
GFR calc Af Amer: 49 mL/min — ABNORMAL LOW (ref >60)
GFR calc Af Amer: 59 mL/min — ABNORMAL LOW (ref >60)
Potassium: 4.7 mEq/L (ref 3.5–5.1)
Sodium: 135 mEq/L (ref 135–145)

## 2010-08-04 LAB — DIFFERENTIAL
Lymphs Abs: 1.2 10*3/uL (ref 0.7–4.0)
Monocytes Relative: 13 % — ABNORMAL HIGH (ref 3–12)
Neutro Abs: 3 10*3/uL (ref 1.7–7.7)
Neutrophils Relative %: 56 % (ref 43–77)

## 2010-08-04 LAB — COMPREHENSIVE METABOLIC PANEL
ALT: 17 U/L (ref 0–35)
BUN: 45 mg/dL — ABNORMAL HIGH (ref 6–23)
Calcium: 9.2 mg/dL (ref 8.4–10.5)
Glucose, Bld: 105 mg/dL — ABNORMAL HIGH (ref 70–99)
Sodium: 135 mEq/L (ref 135–145)
Total Protein: 9.2 g/dL — ABNORMAL HIGH (ref 6.0–8.3)

## 2010-08-04 LAB — CBC
HCT: 28.5 % — ABNORMAL LOW (ref 36.0–46.0)
Hemoglobin: 10 g/dL — ABNORMAL LOW (ref 12.0–15.0)
MCHC: 33.1 g/dL (ref 30.0–36.0)
MCV: 79.2 fL (ref 78.0–100.0)
RBC: 3.59 MIL/uL — ABNORMAL LOW (ref 3.87–5.11)
RDW: 18.4 % — ABNORMAL HIGH (ref 11.5–15.5)
WBC: 5.1 10*3/uL (ref 4.0–10.5)

## 2010-08-04 LAB — PREGNANCY, URINE: Preg Test, Ur: NEGATIVE

## 2010-08-17 LAB — POCT I-STAT 3, ART BLOOD GAS (G3+)
Bicarbonate: 26.5 mEq/L — ABNORMAL HIGH (ref 20.0–24.0)
O2 Saturation: 97 %
TCO2: 28 mmol/L (ref 0–100)
pCO2 arterial: 41.6 mmHg (ref 35.0–45.0)

## 2010-08-17 LAB — POCT I-STAT 3, VENOUS BLOOD GAS (G3P V)
Acid-Base Excess: 1 mmol/L (ref 0.0–2.0)
TCO2: 28 mmol/L (ref 0–100)
pH, Ven: 7.366 — ABNORMAL HIGH (ref 7.250–7.300)

## 2010-08-18 LAB — POCT CARDIAC MARKERS
CKMB, poc: <1 ng/mL — ABNORMAL LOW (ref 1.0–8.0)
Myoglobin, poc: 138 ng/mL (ref 12–200)

## 2010-08-18 LAB — CBC
MCHC: 33 g/dL (ref 30.0–36.0)
MCV: 74.4 fL — ABNORMAL LOW (ref 78.0–100.0)
Platelets: 332 10*3/uL (ref 150–400)
RDW: 18.3 % — ABNORMAL HIGH (ref 11.5–15.5)

## 2010-08-18 LAB — URINALYSIS, ROUTINE W REFLEX MICROSCOPIC
Bilirubin Urine: NEGATIVE
Ketones, ur: NEGATIVE mg/dL
Nitrite: NEGATIVE
Protein, ur: NEGATIVE mg/dL
Specific Gravity, Urine: 1.01 (ref 1.005–1.030)
Urobilinogen, UA: 0.2 mg/dL (ref 0.0–1.0)

## 2010-08-18 LAB — DIFFERENTIAL
Basophils Absolute: 0 10*3/uL (ref 0.0–0.1)
Basophils Relative: 0 % (ref 0–1)
Eosinophils Absolute: 0.2 10*3/uL (ref 0.0–0.7)
Neutro Abs: 4.3 10*3/uL (ref 1.7–7.7)
Neutrophils Relative %: 73 % (ref 43–77)

## 2010-08-18 LAB — BASIC METABOLIC PANEL
BUN: 16 mg/dL (ref 6–23)
CO2: 30 mEq/L (ref 19–32)
Calcium: 8.9 mg/dL (ref 8.4–10.5)
Creatinine, Ser: 0.89 mg/dL (ref 0.4–1.2)
GFR calc Af Amer: >60 mL/min (ref >60)
Glucose, Bld: 117 mg/dL — ABNORMAL HIGH (ref 70–99)

## 2010-08-21 LAB — POCT I-STAT 3, ART BLOOD GAS (G3+)
Acid-base deficit: 1 mmol/L (ref 0.0–2.0)
Acid-base deficit: 3 mmol/L — ABNORMAL HIGH (ref 0.0–2.0)
Bicarbonate: 24.3 mEq/L — ABNORMAL HIGH (ref 20.0–24.0)
O2 Saturation: 100 %
O2 Saturation: 86 %
O2 Saturation: 97 %
TCO2: 26 mmol/L (ref 0–100)
TCO2: 26 mmol/L (ref 0–100)
TCO2: 26 mmol/L (ref 0–100)
TCO2: 27 mmol/L (ref 0–100)
pCO2 arterial: 39.4 mmHg (ref 35.0–45.0)
pCO2 arterial: 44 mmHg (ref 35.0–45.0)
pCO2 arterial: 44.6 mmHg (ref 35.0–45.0)
pCO2 arterial: 45.7 mmHg — ABNORMAL HIGH (ref 35.0–45.0)
pH, Arterial: 7.328 — ABNORMAL LOW (ref 7.350–7.400)
pH, Arterial: 7.333 — ABNORMAL LOW (ref 7.350–7.400)
pO2, Arterial: 255 mmHg — ABNORMAL HIGH (ref 80.0–100.0)
pO2, Arterial: 64 mmHg — ABNORMAL LOW (ref 80.0–100.0)
pO2, Arterial: 83 mmHg (ref 80.0–100.0)

## 2010-08-21 LAB — GLUCOSE, CAPILLARY
Glucose-Capillary: 106 mg/dL — ABNORMAL HIGH (ref 70–99)
Glucose-Capillary: 119 mg/dL — ABNORMAL HIGH (ref 70–99)
Glucose-Capillary: 120 mg/dL — ABNORMAL HIGH (ref 70–99)
Glucose-Capillary: 121 mg/dL — ABNORMAL HIGH (ref 70–99)
Glucose-Capillary: 127 mg/dL — ABNORMAL HIGH (ref 70–99)
Glucose-Capillary: 129 mg/dL — ABNORMAL HIGH (ref 70–99)
Glucose-Capillary: 76 mg/dL (ref 70–99)
Glucose-Capillary: 80 mg/dL (ref 70–99)

## 2010-08-21 LAB — URINALYSIS, ROUTINE W REFLEX MICROSCOPIC
Nitrite: NEGATIVE
Specific Gravity, Urine: 1.007 (ref 1.005–1.030)
Urobilinogen, UA: 0.2 mg/dL (ref 0.0–1.0)
pH: 5.5 (ref 5.0–8.0)

## 2010-08-21 LAB — POCT I-STAT, CHEM 8
BUN: 14 mg/dL (ref 6–23)
Calcium, Ion: 1.15 mmol/L (ref 1.12–1.32)
Chloride: 101 mEq/L (ref 96–112)
Chloride: 110 mEq/L (ref 96–112)
HCT: 27 % — ABNORMAL LOW (ref 36.0–46.0)
Potassium: 5.1 mEq/L (ref 3.5–5.1)
Sodium: 137 mEq/L (ref 135–145)
Sodium: 142 mEq/L (ref 135–145)

## 2010-08-21 LAB — BLOOD GAS, ARTERIAL
Bicarbonate: 25.9 mEq/L — ABNORMAL HIGH (ref 20.0–24.0)
O2 Saturation: 96.7 %
Patient temperature: 98.6
TCO2: 27.1 mmol/L (ref 0–100)
pH, Arterial: 7.423 — ABNORMAL HIGH (ref 7.350–7.400)
pO2, Arterial: 84.5 mmHg (ref 80.0–100.0)

## 2010-08-21 LAB — HEMOGLOBIN A1C
Hgb A1c MFr Bld: 6.4 % — ABNORMAL HIGH (ref 4.6–6.1)
Mean Plasma Glucose: 137 mg/dL

## 2010-08-21 LAB — MAGNESIUM
Magnesium: 2.3 mg/dL (ref 1.5–2.5)
Magnesium: 2.5 mg/dL (ref 1.5–2.5)
Magnesium: 2.6 mg/dL — ABNORMAL HIGH (ref 1.5–2.5)

## 2010-08-21 LAB — CBC
HCT: 23.8 % — ABNORMAL LOW (ref 36.0–46.0)
HCT: 28.6 % — ABNORMAL LOW (ref 36.0–46.0)
HCT: 34.3 % — ABNORMAL LOW (ref 36.0–46.0)
Hemoglobin: 10.8 g/dL — ABNORMAL LOW (ref 12.0–15.0)
Hemoglobin: 7.6 g/dL — CL (ref 12.0–15.0)
Hemoglobin: 8.2 g/dL — ABNORMAL LOW (ref 12.0–15.0)
Hemoglobin: 8.3 g/dL — ABNORMAL LOW (ref 12.0–15.0)
Hemoglobin: 8.7 g/dL — ABNORMAL LOW (ref 12.0–15.0)
Hemoglobin: 9.1 g/dL — ABNORMAL LOW (ref 12.0–15.0)
MCHC: 31.2 g/dL (ref 30.0–36.0)
MCHC: 31.5 g/dL (ref 30.0–36.0)
MCV: 70 fL — ABNORMAL LOW (ref 78.0–100.0)
MCV: 70.7 fL — ABNORMAL LOW (ref 78.0–100.0)
MCV: 71 fL — ABNORMAL LOW (ref 78.0–100.0)
MCV: 72.4 fL — ABNORMAL LOW (ref 78.0–100.0)
MCV: 73.8 fL — ABNORMAL LOW (ref 78.0–100.0)
MCV: 73.8 fL — ABNORMAL LOW (ref 78.0–100.0)
Platelets: 186 10*3/uL (ref 150–400)
Platelets: 225 10*3/uL (ref 150–400)
Platelets: 363 10*3/uL (ref 150–400)
RBC: 3.23 MIL/uL — ABNORMAL LOW (ref 3.87–5.11)
RBC: 3.48 MIL/uL — ABNORMAL LOW (ref 3.87–5.11)
RBC: 3.68 MIL/uL — ABNORMAL LOW (ref 3.87–5.11)
RBC: 3.73 MIL/uL — ABNORMAL LOW (ref 3.87–5.11)
RBC: 3.88 MIL/uL (ref 3.87–5.11)
RBC: 4.17 MIL/uL (ref 3.87–5.11)
RDW: 22.7 % — ABNORMAL HIGH (ref 11.5–15.5)
RDW: 23.2 % — ABNORMAL HIGH (ref 11.5–15.5)
RDW: 23.3 % — ABNORMAL HIGH (ref 11.5–15.5)
RDW: 24 % — ABNORMAL HIGH (ref 11.5–15.5)
WBC: 11.3 10*3/uL — ABNORMAL HIGH (ref 4.0–10.5)
WBC: 11.6 10*3/uL — ABNORMAL HIGH (ref 4.0–10.5)
WBC: 14.1 10*3/uL — ABNORMAL HIGH (ref 4.0–10.5)
WBC: 20 10*3/uL — ABNORMAL HIGH (ref 4.0–10.5)
WBC: 3.6 10*3/uL — ABNORMAL LOW (ref 4.0–10.5)
WBC: 8.1 10*3/uL (ref 4.0–10.5)
WBC: 8.5 10*3/uL (ref 4.0–10.5)

## 2010-08-21 LAB — BASIC METABOLIC PANEL
BUN: 12 mg/dL (ref 6–23)
BUN: 9 mg/dL (ref 6–23)
Calcium: 8.5 mg/dL (ref 8.4–10.5)
Chloride: 111 mEq/L (ref 96–112)
Chloride: 96 mEq/L (ref 96–112)
Chloride: 97 mEq/L (ref 96–112)
Creatinine, Ser: 0.81 mg/dL (ref 0.4–1.2)
Creatinine, Ser: 0.96 mg/dL (ref 0.4–1.2)
GFR calc Af Amer: >60 mL/min (ref >60)
GFR calc Af Amer: >60 mL/min (ref >60)
GFR calc non Af Amer: 59 mL/min — ABNORMAL LOW (ref >60)
GFR calc non Af Amer: >60 mL/min (ref >60)
Glucose, Bld: 129 mg/dL — ABNORMAL HIGH (ref 70–99)
Potassium: 4.2 mEq/L (ref 3.5–5.1)
Sodium: 132 mEq/L — ABNORMAL LOW (ref 135–145)
Sodium: 136 mEq/L (ref 135–145)

## 2010-08-21 LAB — POCT I-STAT GLUCOSE: Operator id: 3406

## 2010-08-21 LAB — POCT I-STAT 4, (NA,K, GLUC, HGB,HCT)
Glucose, Bld: 113 mg/dL — ABNORMAL HIGH (ref 70–99)
Glucose, Bld: 124 mg/dL — ABNORMAL HIGH (ref 70–99)
Glucose, Bld: 131 mg/dL — ABNORMAL HIGH (ref 70–99)
HCT: 24 % — ABNORMAL LOW (ref 36.0–46.0)
HCT: 25 % — ABNORMAL LOW (ref 36.0–46.0)
HCT: 31 % — ABNORMAL LOW (ref 36.0–46.0)
HCT: 31 % — ABNORMAL LOW (ref 36.0–46.0)
HCT: 33 % — ABNORMAL LOW (ref 36.0–46.0)
Hemoglobin: 10.5 g/dL — ABNORMAL LOW (ref 12.0–15.0)
Hemoglobin: 10.5 g/dL — ABNORMAL LOW (ref 12.0–15.0)
Hemoglobin: 9.5 g/dL — ABNORMAL LOW (ref 12.0–15.0)
Potassium: 3.5 mEq/L (ref 3.5–5.1)
Potassium: 3.5 mEq/L (ref 3.5–5.1)
Potassium: 3.7 mEq/L (ref 3.5–5.1)
Potassium: 3.8 mEq/L (ref 3.5–5.1)
Potassium: 4.1 mEq/L (ref 3.5–5.1)
Sodium: 137 mEq/L (ref 135–145)
Sodium: 139 mEq/L (ref 135–145)
Sodium: 143 mEq/L (ref 135–145)

## 2010-08-21 LAB — MRSA CULTURE

## 2010-08-21 LAB — COMPREHENSIVE METABOLIC PANEL
Albumin: 3.3 g/dL — ABNORMAL LOW (ref 3.5–5.2)
Alkaline Phosphatase: 118 U/L — ABNORMAL HIGH (ref 39–117)
BUN: 9 mg/dL (ref 6–23)
Chloride: 105 mEq/L (ref 96–112)
Creatinine, Ser: 0.71 mg/dL (ref 0.4–1.2)
GFR calc non Af Amer: >60 mL/min (ref >60)
Glucose, Bld: 102 mg/dL — ABNORMAL HIGH (ref 70–99)
Potassium: 4.2 mEq/L (ref 3.5–5.1)
Total Bilirubin: 0.6 mg/dL (ref 0.3–1.2)

## 2010-08-21 LAB — PROTIME-INR
INR: 1.3 (ref 0.00–1.49)
Prothrombin Time: 15.7 seconds — ABNORMAL HIGH (ref 11.6–15.2)

## 2010-08-21 LAB — ABO/RH: ABO/RH(D): A POS

## 2010-08-21 LAB — HEMOGLOBIN AND HEMATOCRIT, BLOOD: HCT: 20.9 % — ABNORMAL LOW (ref 36.0–46.0)

## 2010-08-21 LAB — CREATININE, SERUM
Creatinine, Ser: 0.66 mg/dL (ref 0.4–1.2)
GFR calc Af Amer: >60 mL/min (ref >60)

## 2010-08-21 LAB — DIFFERENTIAL
Basophils Absolute: 0 10*3/uL (ref 0.0–0.1)
Basophils Relative: 0 % (ref 0–1)
Lymphocytes Relative: 4 % — ABNORMAL LOW (ref 12–46)
Neutro Abs: 10.4 10*3/uL — ABNORMAL HIGH (ref 1.7–7.7)
Neutrophils Relative %: 92 % — ABNORMAL HIGH (ref 43–77)

## 2010-08-21 LAB — TYPE AND SCREEN: Antibody Screen: NEGATIVE

## 2010-08-21 LAB — APTT: aPTT: 26 seconds (ref 24–37)

## 2010-08-21 LAB — PLATELET COUNT: Platelets: 194 10*3/uL (ref 150–400)

## 2010-08-22 LAB — BASIC METABOLIC PANEL
Chloride: 106 mEq/L (ref 96–112)
Creatinine, Ser: 0.7 mg/dL (ref 0.4–1.2)
GFR calc Af Amer: >60 mL/min (ref >60)

## 2010-08-22 LAB — COMPREHENSIVE METABOLIC PANEL
AST: 30 U/L (ref 0–37)
Albumin: 3.1 g/dL — ABNORMAL LOW (ref 3.5–5.2)
Alkaline Phosphatase: 101 U/L (ref 39–117)
Chloride: 102 mEq/L (ref 96–112)
GFR calc Af Amer: >60 mL/min (ref >60)
Potassium: 3.8 mEq/L (ref 3.5–5.1)
Sodium: 141 mEq/L (ref 135–145)
Total Bilirubin: 0.9 mg/dL (ref 0.3–1.2)

## 2010-08-22 LAB — CBC
HCT: 32.4 % — ABNORMAL LOW (ref 36.0–46.0)
MCV: 70.3 fL — ABNORMAL LOW (ref 78.0–100.0)
Platelets: 233 10*3/uL (ref 150–400)
RBC: 4.87 MIL/uL (ref 3.87–5.11)
WBC: 4.3 10*3/uL (ref 4.0–10.5)
WBC: 5.4 10*3/uL (ref 4.0–10.5)

## 2010-08-22 LAB — PROTIME-INR
INR: 1 (ref 0.00–1.49)
Prothrombin Time: 13.4 seconds (ref 11.6–15.2)

## 2010-08-22 LAB — BRAIN NATRIURETIC PEPTIDE: Pro B Natriuretic peptide (BNP): 213 pg/mL — ABNORMAL HIGH (ref 0.0–100.0)

## 2010-08-23 LAB — URINALYSIS, ROUTINE W REFLEX MICROSCOPIC
Specific Gravity, Urine: 1.026 (ref 1.005–1.030)
Urobilinogen, UA: 0.2 mg/dL (ref 0.0–1.0)
pH: 5 (ref 5.0–8.0)

## 2010-08-23 LAB — CBC
HCT: 30.1 % — ABNORMAL LOW (ref 36.0–46.0)
HCT: 33.8 % — ABNORMAL LOW (ref 36.0–46.0)
HCT: 34.2 % — ABNORMAL LOW (ref 36.0–46.0)
HCT: 37.3 % (ref 36.0–46.0)
Hemoglobin: 10.8 g/dL — ABNORMAL LOW (ref 12.0–15.0)
Hemoglobin: 10.9 g/dL — ABNORMAL LOW (ref 12.0–15.0)
Hemoglobin: 9.3 g/dL — ABNORMAL LOW (ref 12.0–15.0)
MCHC: 30.6 g/dL (ref 30.0–36.0)
MCHC: 30.8 g/dL (ref 30.0–36.0)
MCHC: 30.9 g/dL (ref 30.0–36.0)
MCHC: 31.3 g/dL (ref 30.0–36.0)
MCHC: 31.8 g/dL (ref 30.0–36.0)
MCHC: 32.6 g/dL (ref 30.0–36.0)
MCV: 67.9 fL — ABNORMAL LOW (ref 78.0–100.0)
MCV: 68.2 fL — ABNORMAL LOW (ref 78.0–100.0)
MCV: 69.1 fL — ABNORMAL LOW (ref 78.0–100.0)
MCV: 91.3 fL (ref 78.0–100.0)
Platelets: 273 10*3/uL (ref 150–400)
Platelets: 301 10*3/uL (ref 150–400)
Platelets: 315 10*3/uL (ref 150–400)
Platelets: 324 10*3/uL (ref 150–400)
RBC: 3.75 MIL/uL — ABNORMAL LOW (ref 3.87–5.11)
RBC: 4.89 MIL/uL (ref 3.87–5.11)
RBC: 5.4 MIL/uL — ABNORMAL HIGH (ref 3.87–5.11)
RBC: 5.57 MIL/uL — ABNORMAL HIGH (ref 3.87–5.11)
RDW: 22.3 % — ABNORMAL HIGH (ref 11.5–15.5)
RDW: 22.4 % — ABNORMAL HIGH (ref 11.5–15.5)
RDW: 22.8 % — ABNORMAL HIGH (ref 11.5–15.5)
RDW: 24.1 % — ABNORMAL HIGH (ref 11.5–15.5)
WBC: 11.2 10*3/uL — ABNORMAL HIGH (ref 4.0–10.5)
WBC: 12.9 10*3/uL — ABNORMAL HIGH (ref 4.0–10.5)
WBC: 5.9 10*3/uL (ref 4.0–10.5)
WBC: 7.6 10*3/uL (ref 4.0–10.5)

## 2010-08-23 LAB — LIPID PANEL
Cholesterol: 140 mg/dL (ref 0–200)
Total CHOL/HDL Ratio: 3.3 RATIO
VLDL: 11 mg/dL (ref 0–40)

## 2010-08-23 LAB — PROTEIN ELECTROPH W RFLX QUANT IMMUNOGLOBULINS
Beta Globulin: 7.8 % — ABNORMAL HIGH (ref 4.7–7.2)
Gamma Globulin: 19.9 % — ABNORMAL HIGH (ref 11.1–18.8)
M-Spike, %: NOT DETECTED g/dL
Total Protein ELP: 7.3 g/dL (ref 6.0–8.3)

## 2010-08-23 LAB — BASIC METABOLIC PANEL
BUN: 12 mg/dL (ref 6–23)
BUN: 17 mg/dL (ref 6–23)
BUN: 18 mg/dL (ref 6–23)
BUN: 9 mg/dL (ref 6–23)
CO2: 20 mEq/L (ref 19–32)
CO2: 26 mEq/L (ref 19–32)
CO2: 27 mEq/L (ref 19–32)
CO2: 29 mEq/L (ref 19–32)
CO2: 30 mEq/L (ref 19–32)
CO2: 30 mEq/L (ref 19–32)
CO2: 33 mEq/L — ABNORMAL HIGH (ref 19–32)
Calcium: 9 mg/dL (ref 8.4–10.5)
Calcium: 9.4 mg/dL (ref 8.4–10.5)
Chloride: 102 mEq/L (ref 96–112)
Chloride: 103 mEq/L (ref 96–112)
Chloride: 103 mEq/L (ref 96–112)
Chloride: 103 mEq/L (ref 96–112)
Chloride: 104 mEq/L (ref 96–112)
Chloride: 104 mEq/L (ref 96–112)
Chloride: 105 mEq/L (ref 96–112)
Chloride: 99 mEq/L (ref 96–112)
Creatinine, Ser: 0.89 mg/dL (ref 0.4–1.2)
Creatinine, Ser: 0.93 mg/dL (ref 0.4–1.2)
Creatinine, Ser: 1.01 mg/dL (ref 0.4–1.2)
GFR calc Af Amer: 13 mL/min — ABNORMAL LOW (ref >60)
GFR calc Af Amer: 52 mL/min — ABNORMAL LOW (ref >60)
GFR calc Af Amer: >60 mL/min (ref >60)
GFR calc Af Amer: >60 mL/min (ref >60)
GFR calc Af Amer: >60 mL/min (ref >60)
GFR calc Af Amer: >60 mL/min (ref >60)
Glucose, Bld: 112 mg/dL — ABNORMAL HIGH (ref 70–99)
Glucose, Bld: 115 mg/dL — ABNORMAL HIGH (ref 70–99)
Glucose, Bld: 122 mg/dL — ABNORMAL HIGH (ref 70–99)
Glucose, Bld: 132 mg/dL — ABNORMAL HIGH (ref 70–99)
Potassium: 3.5 mEq/L (ref 3.5–5.1)
Potassium: 3.8 mEq/L (ref 3.5–5.1)
Potassium: 3.9 mEq/L (ref 3.5–5.1)
Potassium: 4.3 mEq/L (ref 3.5–5.1)
Potassium: 4.4 mEq/L (ref 3.5–5.1)
Potassium: 4.4 mEq/L (ref 3.5–5.1)
Sodium: 135 mEq/L (ref 135–145)
Sodium: 135 mEq/L (ref 135–145)
Sodium: 136 mEq/L (ref 135–145)
Sodium: 138 mEq/L (ref 135–145)
Sodium: 139 mEq/L (ref 135–145)

## 2010-08-23 LAB — PROTIME-INR
INR: 1 (ref 0.00–1.49)
INR: 1.1 (ref 0.00–1.49)
Prothrombin Time: 13.7 seconds (ref 11.6–15.2)

## 2010-08-23 LAB — RENAL FUNCTION PANEL
Albumin: 3 g/dL — ABNORMAL LOW (ref 3.5–5.2)
BUN: 11 mg/dL (ref 6–23)
BUN: 17 mg/dL (ref 6–23)
CO2: 26 mEq/L (ref 19–32)
CO2: 27 mEq/L (ref 19–32)
Chloride: 105 mEq/L (ref 96–112)
Creatinine, Ser: 1.19 mg/dL (ref 0.4–1.2)
Glucose, Bld: 131 mg/dL — ABNORMAL HIGH (ref 70–99)
Potassium: 4.5 mEq/L (ref 3.5–5.1)
Potassium: 4.9 mEq/L (ref 3.5–5.1)
Sodium: 135 mEq/L (ref 135–145)

## 2010-08-23 LAB — POCT I-STAT 3, ART BLOOD GAS (G3+)
Acid-Base Excess: 2 mmol/L (ref 0.0–2.0)
Bicarbonate: 21.5 mEq/L (ref 20.0–24.0)
O2 Saturation: 86 %
O2 Saturation: 92 %
TCO2: 29 mmol/L (ref 0–100)
pCO2 arterial: 47.9 mmHg — ABNORMAL HIGH (ref 35.0–45.0)
pCO2 arterial: 49.6 mmHg — ABNORMAL HIGH (ref 35.0–45.0)
pO2, Arterial: 61 mmHg — ABNORMAL LOW (ref 80.0–100.0)
pO2, Arterial: 66 mmHg — ABNORMAL LOW (ref 80.0–100.0)

## 2010-08-23 LAB — CARDIAC PANEL(CRET KIN+CKTOT+MB+TROPI)
Relative Index: INVALID (ref 0.0–2.5)
Relative Index: INVALID (ref 0.0–2.5)
Total CK: 82 U/L (ref 7–177)
Total CK: 84 U/L (ref 7–177)
Troponin I: 0.03 ng/mL (ref 0.00–0.06)
Troponin I: 0.05 ng/mL (ref 0.00–0.06)

## 2010-08-23 LAB — COMPREHENSIVE METABOLIC PANEL
ALT: 34 U/L (ref 0–35)
AST: 30 U/L (ref 0–37)
CO2: 23 mEq/L (ref 19–32)
Calcium: 10.2 mg/dL (ref 8.4–10.5)
Chloride: 105 mEq/L (ref 96–112)
GFR calc Af Amer: 12 mL/min — ABNORMAL LOW (ref >60)
GFR calc non Af Amer: 10 mL/min — ABNORMAL LOW (ref >60)
Potassium: 4.2 mEq/L (ref 3.5–5.1)
Sodium: 140 mEq/L (ref 135–145)

## 2010-08-23 LAB — FERRITIN: Ferritin: 64 ng/mL (ref 10–291)

## 2010-08-23 LAB — URINE MICROSCOPIC-ADD ON

## 2010-08-23 LAB — UIFE/LIGHT CHAINS/TP QN, 24-HR UR
Albumin, U: DETECTED
Alpha 1, Urine: DETECTED — AB
Free Kappa Lt Chains,Ur: 65.4 mg/dL — ABNORMAL HIGH (ref 0.04–1.51)
Free Lambda Lt Chains,Ur: 10.9 mg/dL — ABNORMAL HIGH (ref 0.08–1.01)

## 2010-08-23 LAB — POCT CARDIAC MARKERS

## 2010-08-23 LAB — RETICULOCYTES: Retic Count, Absolute: 114.2 10*3/uL (ref 19.0–186.0)

## 2010-08-23 LAB — DIFFERENTIAL
Basophils Relative: 6 % — ABNORMAL HIGH (ref 0–1)
Eosinophils Absolute: 0.2 10*3/uL (ref 0.0–0.7)
Eosinophils Relative: 1 % (ref 0–5)
Eosinophils Relative: 2 % (ref 0–5)
Lymphocytes Relative: 14 % (ref 12–46)
Lymphs Abs: 1.7 10*3/uL (ref 0.7–4.0)
Monocytes Absolute: 0.8 10*3/uL (ref 0.1–1.0)
Monocytes Absolute: 0.9 10*3/uL (ref 0.1–1.0)
Neutro Abs: 8.3 10*3/uL — ABNORMAL HIGH (ref 1.7–7.7)
Neutrophils Relative %: 70 % (ref 43–77)

## 2010-08-23 LAB — CULTURE, BLOOD (ROUTINE X 2): Culture: NO GROWTH

## 2010-08-23 LAB — IRON AND TIBC
Iron: 31 ug/dL — ABNORMAL LOW (ref 42–135)
Saturation Ratios: 8 % — ABNORMAL LOW (ref 20–55)
TIBC: 368 ug/dL (ref 250–470)

## 2010-08-23 LAB — POCT I-STAT 3, VENOUS BLOOD GAS (G3P V)
Bicarbonate: 27.3 mEq/L — ABNORMAL HIGH (ref 20.0–24.0)
O2 Saturation: 61 %
pCO2, Ven: 47.6 mmHg (ref 45.0–50.0)
pCO2, Ven: 50.5 mmHg — ABNORMAL HIGH (ref 45.0–50.0)
pH, Ven: 7.366 — ABNORMAL HIGH (ref 7.250–7.300)
pO2, Ven: 34 mmHg (ref 30.0–45.0)

## 2010-08-23 LAB — FOLATE: Folate: >20 ng/mL

## 2010-08-23 LAB — URINE CULTURE

## 2010-08-23 LAB — POCT I-STAT, CHEM 8
Creatinine, Ser: 0.9 mg/dL (ref 0.4–1.2)
Glucose, Bld: 102 mg/dL — ABNORMAL HIGH (ref 70–99)
Hemoglobin: 11.9 g/dL — ABNORMAL LOW (ref 12.0–15.0)
TCO2: 30 mmol/L (ref 0–100)

## 2010-08-23 LAB — APTT: aPTT: 30 seconds (ref 24–37)

## 2010-08-23 LAB — D-DIMER, QUANTITATIVE: D-Dimer, Quant: 1.53 ug/mL-FEU — ABNORMAL HIGH (ref 0.00–0.48)

## 2010-08-23 LAB — VITAMIN B12: Vitamin B-12: 375 pg/mL (ref 211–911)

## 2010-08-23 LAB — HEMOGLOBINOPATHY EVALUATION: Hemoglobin Other: 0 % (ref 0.0–0.0)

## 2010-08-26 LAB — CBC
HCT: 27 % — ABNORMAL LOW (ref 36.0–46.0)
Hemoglobin: 8.5 g/dL — ABNORMAL LOW (ref 12.0–15.0)
MCHC: 31.6 g/dL (ref 30.0–36.0)
MCV: 67.4 fL — ABNORMAL LOW (ref 78.0–100.0)
RBC: 4.01 MIL/uL (ref 3.87–5.11)
RDW: 21.6 % — ABNORMAL HIGH (ref 11.5–15.5)

## 2010-08-26 LAB — BASIC METABOLIC PANEL
CO2: 26 mEq/L (ref 19–32)
Chloride: 103 mEq/L (ref 96–112)
GFR calc Af Amer: >60 mL/min (ref >60)
Glucose, Bld: 116 mg/dL — ABNORMAL HIGH (ref 70–99)
Potassium: 4 mEq/L (ref 3.5–5.1)
Sodium: 137 mEq/L (ref 135–145)

## 2010-08-26 LAB — DIFFERENTIAL
Basophils Relative: 0 % (ref 0–1)
Eosinophils Absolute: 0.3 10*3/uL (ref 0.0–0.7)
Lymphocytes Relative: 19 % (ref 12–46)
Lymphs Abs: 0.8 10*3/uL (ref 0.7–4.0)
Monocytes Absolute: 0.1 10*3/uL (ref 0.1–1.0)
Neutro Abs: 2.8 10*3/uL (ref 1.7–7.7)

## 2010-08-30 LAB — POCT I-STAT, CHEM 8
BUN: 6 mg/dL (ref 6–23)
Calcium, Ion: 0.98 mmol/L — ABNORMAL LOW (ref 1.12–1.32)
HCT: 33 % — ABNORMAL LOW (ref 36.0–46.0)
Sodium: 141 mEq/L (ref 135–145)
TCO2: 24 mmol/L (ref 0–100)

## 2010-08-30 LAB — DIFFERENTIAL
Basophils Relative: 0 % (ref 0–1)
Lymphs Abs: 1.2 10*3/uL (ref 0.7–4.0)
Monocytes Relative: 5 % (ref 3–12)
Neutro Abs: 3.1 10*3/uL (ref 1.7–7.7)
Neutrophils Relative %: 66 % (ref 43–77)

## 2010-08-30 LAB — POCT CARDIAC MARKERS: Myoglobin, poc: 84.6 ng/mL (ref 12–200)

## 2010-08-30 LAB — CBC
HCT: 29.8 % — ABNORMAL LOW (ref 36.0–46.0)
Hemoglobin: 9.4 g/dL — ABNORMAL LOW (ref 12.0–15.0)
MCHC: 31.5 g/dL (ref 30.0–36.0)
Platelets: 312 10*3/uL (ref 150–400)

## 2010-08-30 LAB — BRAIN NATRIURETIC PEPTIDE: Pro B Natriuretic peptide (BNP): 339 pg/mL — ABNORMAL HIGH (ref 0.0–100.0)

## 2010-08-30 LAB — LACTATE DEHYDROGENASE: LDH: 287 U/L — ABNORMAL HIGH (ref 94–250)

## 2010-09-28 NOTE — Assessment & Plan Note (Signed)
OFFICE VISIT   [name], [name]  DOB:  Jun 05, 1960                                        March 02, 2009  CHART #:  [id number]   HISTORY OF PRESENT ILLNESS:  The patient returns to the office today for  followup status post right miniature thoracotomy for mitral valve  repair, tricuspid valve repair, and closure of patent foramen ovale on  December 18, 2008.  She was last seen here in the office on January 12, 2009.  Since then, she has continued to do fairly well.  However, she  returns to the office today somewhat concerned about a rash and an area  of skin breakdown underneath her right breast.  She also has had some  progressive weight gain, increased exertional shortness of breath, and  bilateral lower extremity edema.  She denies any fevers or chills.  She  denies any significant pain in her chest.  She has not had any tachy  palpitations or dizzy spells.  She continues to follow up with Dr.  Shirlee Latch and Dr. Audria Nine.  The remainder of her past medical history is  unchanged.   CURRENT MEDICATIONS:  Protonix, Flexeril, Coreg, aspirin, Celexa,  Ventolin inhaler, Lasix, potassium chloride, iron complex, lisinopril,  and Xanax.   PHYSICAL EXAMINATION:  General:  Notable for a well-appearing morbidly  obese African American female.  Vital Signs:  Blood pressure 150/100,  pulse 86, and oxygen saturation 97% on room air.  Chest:  Her right  miniature anterolateral thoracotomy incision has healed completely.  Underneath the right breast, there is a dry hyperpigmented rash  throughout the skin crease.  The 2 chest tube incision sites have not  healed completely, but they are clean and open.  There is no sign of  surrounding cellulitis.  There is no drainage.  Skin is hypersensitive  and tender along this rash.  Lungs:  Auscultation of the chest reveals  clear breath sounds with a few inspiratory crackles at the lung bases,  diminished breath sounds at both  lung bases.  No wheezes, rales, or  rhonchi noted other than the small minor crackles as noted.  Cardiovascular:  Notable for regular rate and rhythm.  Heart sounds are  distant.  Discrete heart murmur cannot be appreciated.  The abdomen is  obese, soft, and nontender.  Extremities:  Warm and adequately perfused.  There is mild-to-moderate bilateral lower extremity edema.   IMPRESSION:  I suspect the rash underneath her right breast may  represent yeast infection.  I do not think this is related to the  shortness of breath, and I am more concerned that her shortness of  breath is related to fluid overload.  She believes that she has probably  gained 12 pounds over the last 4-6 weeks.  She is attributed to the fact  that she has been eating a lot of sweets and she quit smoking.   PLAN:  I have given the patient a prescription for nystatin topical  cream to apply to the area of rash underneath her right breast for the  next 2 weeks.  Hopefully, this will clear this up.  She may need an  increased dose of diuretic, but she is scheduled to see Dr. Shirlee Latch for  followup next week.  We will leave any further adjustment to her medical  therapy  for congestive heart failure up to his discretion.  All of her  questions have been addressed.  We will see her back in 2 months.   Salvatore Decent. Cornelius Moras, M.D.  Electronically Signed   CHO/MEDQ  D:  03/02/2009  T:  03/03/2009  Job:  [id number]   cc:   [name], MD  Maurice March, M.D.

## 2010-09-28 NOTE — Consult Note (Signed)
NAMETARONDA, COMACHO           ACCOUNT NO.:  [id number]   MEDICAL RECORD NO.:  [id number]          PATIENT TYPE:  INP   LOCATION:  4731                         FACILITY:  [facility]   PHYSICIAN:  Casimiro Needle B. Sherene Sires, MD, FCCPDATE OF BIRTH:  1960/11/19   DATE OF CONSULTATION:  11/01/2008  DATE OF DISCHARGE:                                 CONSULTATION   REQUESTING PHYSICIAN:  Jude Ojie, MD   REASON FOR CONSULTATION:  Lymphadenopathy.   HISTORY:  This is an exceptionally complicated 50 year old black female  with a history of hypertension, rheumatoid arthritis that is steroid  dependent, actively smoking with progressive dyspnea over the last  several months that was much worse over the last several days prior to  admission associated with leg swelling and was admitted on October 30, 2008, with evidence of cardiac enlargement with vascular congestion on  the admission film and a BNP of 260.  However, subsequent workup has  revealed evidence of decreased LV function along with mitral  regurgitation, and she is under evaluation by Cardiology.  She has  undergone diuresis with improvement in leg swelling but is afraid to  lie down. because she has been having smothering when she lies down at  home.  She is comfortable on oxygen and up at 60 degrees.  She denies  any cough or pleuritic pain.  She denies ever being told before that she  had any significant lung disease despite her extended smoking history.   PAST MEDICAL HISTORY:  1. Hypertension.  2. GERD, clinical diagnosis.  3. Gout.  4. Rheumatoid arthritis, steroid dependent.   ALLERGIES:  The patient is allergic to colchicine.   MEDICATIONS:  An an outpatient include prednisone, Flexeril, potassium  chloride, Lasix, clonidine, and Protonix.   Family history is negative for respiratory diseases.   Social history is positive for smoking as noted above.   Review of systems is taken in detail from the patient directly and also  from  the [facility] with no additional findings.   PHYSICAL EXAMINATION:  GENERAL:  She is pleasant but anxious black  female who does not appear to answer questions in a straightforward  fashion.  VITAL SIGNS:  Stable.  HEENT:  Unremarkable.  Pharynx is clear.  NECK:  Supple without cervical adenopathy or tenderness.  Trachea is  midline.  No thyromegaly.  LUNGS:  Lung fields reveal a few rhonchi bilaterally, but ORIM is  adequate.  I could not appreciate any expiratory wheeze.  CARDIAC:  There is a regular rate and rhythm with 2/6 systolic murmur.  ABDOMEN:  Soft, benign with no palpable organomegaly, masses, or  tenderness.  EXTREMITIES:  Warm without calf tenderness, cyanosis, clubbing, or  edema.   I reviewed her chest x-ray and CT scan.  Her chest x-ray initially did  show vascular congestion, and a CT scan suggests minimal adenopathy with  no evidence of thromboembolic disease or overt emphysema.   IMPRESSION:  Most of this patient's dyspnea is likely on the basis of  pulmonary edema from mitral regurgitation.  She is symptomatically  improved from diuresis and cardiology workup is  in progress.   It is not uncommon to see mild reactive adenopathy both in the setting  of chronic smoking also with chronic heart failure, but there is no  relevance to her present complaints of dyspnea based on the fact that  the adenopathy does not appear to compress the airways and there is no  clinical indication for early diagnosis of mediastinal and hilar  adenopathy in this particular setting.   I would caution that I note she has been placed on high doses of ACE  inhibitors and beta-blockers and seems to be tolerating this well.  Obviously the most  important aspect of her care at this point from a  pulmonary perspective is that her congestive heart failure be treated  aggressively and that she should not smoke any more before furhter  pulmonary evaluation.   However, I am certainly be happy  to see this patient back in the office  and if there is macroscopic evidence of evolving adenopathy, then I  might reconsider the above conservative recommendations.      Charlaine Dalton. Sherene Sires, MD, [facility]  Electronically Signed     MBW/MEDQ  D:  11/02/2008  T:  11/03/2008  Job:  [id number]   cc:   Dala Dock

## 2010-09-28 NOTE — Consult Note (Signed)
[name]           ACCOUNT NO.:  [id number]   MEDICAL RECORD NO.:  [id number]          PATIENT TYPE:  INP   LOCATION:  4704                         FACILITY:  [facility]   PHYSICIAN:  Maree Krabbe, M.D.DATE OF BIRTH:  25-Aug-1960   DATE OF CONSULTATION:  11/08/2008  DATE OF DISCHARGE:                                 CONSULTATION   REASON FOR CONSULT:  Acute renal failure.   HISTORY:  This is a pleasant African American female who is 50 years  old.  She has a history of hypertension about 2 years duration, GERD,  gout, and rheumatoid arthritis on prednisone.  She was admitted on June  17 after developing progressive worsening shortness of breath and leg  edema over a period of a week.  She has symptoms of congestive heart  failure with orthopnea and PND, and was admitted for workup.  Chest x-  ray showed vascular congestion and echocardiogram showed severe mitral  regurgitation.  A TEE was done and this confirms severe mitral  regurgitation.  She underwent heart catheterization on June 22, which  showed no significant coronary artery disease.  She was started on ACE  inhibitor and Lasix as well as Coreg in the hospital and her edema  improved and her shortness of breath improved.  She did have a  microcytic anemia and heme-positive stool during that admission.  She  underwent upper EGD and colonoscopy.  She did have some gastritis with  some linear gastric ulcerations on EGD.  The patient was discharged on  June 23.  Her creatinine at discharge was 0.8.   On June 25, the patient developed nausea, vomiting, and abdominal pain  in the mid abdomen.  She also noted that she was not passing urine as  frequently as usual.  She came to emergency room where lab showed a  creatinine of 4.8.  She was admitted to the hospitalist service and her  ACE inhibitor and Lasix were held.  She has been getting fluids of 40 mL  per hour, and her abdominal pain and vomiting have  significantly  improved, although not entirely resolved.  The creatinine today is 4.4,  which is down from 4.8 on admission.  The patient is making urine with  600 mL yesterday and 450 mL so far today.  She denies any use of over-  the-counter NSAIDs.  She did receive dye with the heart catheterization  recently during the above described admission. She had a CT angiogram of  the chest on June 18 also.   Urinalysis on this admission shows 30 protein, 3-6 red blood cells with  0-2 white blood cells, and rare bacteria.  Nitrite and leukocytes are  negative.  CBC showed white count of 7000, hemoglobin of 12, and normal  platelets.  Lipase level was normal ruling out pancreatitis.  Renal  ultrasound, done today, showed normal right and left kidney with normal  size and echogenicity.  A 12.6 cm on the right and 11.7 on the left.   The patient currently says she feels a little bit better.   PAST MEDICAL HISTORY:  1. Hypertension.  2. Obstructive sleep apnea.  3. Anemia.  4. Gastric ulcers with guaiac-positive stool.  5. Morbid obesity.  6. Rheumatoid arthritis on prednisone.  7. History of gout.  8. Depression.  9. GERD.  10.History of severe mitral regurgitation.  11.Congestive heart failure as above, recently diagnosed.   MEDICATIONS ON ADMISSION:  Vicodin, Protonix, prednisone 10 a day,  potassium chloride, Lasix 40 a day, Flexeril p.r.n., Coreg 6.25 b.i.d.,  aspirin 81 daily, lisinopril 40 daily, Celexa 20 daily.   ALLERGIES:  COLCHICINE.   SOCIAL HISTORY:  She smokes less than half a pack a day.  No alcohol or  drug use.  She is single.  She used to work in an assisted living  facility in the area.   FAMILY HISTORY:  Negative for renal disease.   REVIEW OF SYSTEMS:  Denies fever, chills, sweats, weight loss, hearing  loss, visual change, sore throat, and difficulty swallowing.  Denies  productive cough, hemoptysis, orthopnea, PND, or chest pain.  GASTROINTESTINAL:  As  above.  No diarrhea.  She has been vomiting since  yesterday.  GENITOURINARY:  As above.  MUSCULOSKELETAL:  She is having  some knee pain and left wrist pain, otherwise nothing new.  No ankle  swelling.  NEUROLOGIC:  No focal numbness or weakness.  No history of  stroke, TIA, or seizure.   PHYSICAL EXAMINATION:  VITAL SIGNS:  Temp 98, blood pressure 101/68,  pulse 70, respirations 20, and O2 sat 96% on room air.  GENERAL:  This is a pleasant obese female in no distress.  SKIN:  Warm and dry without rash, cyanosis, or gangrene.  HEENT:  PERRL.  EOMI.  Sclerae are bit muddy.  Throat is clear and  slightly dry.  NECK:  Supple with JVP of about 6-8 cm, nondistended.  No  lymphadenopathy in the neck.  CHEST:  Clear throughout at the bases.  CARDIAC:  Regular rate and rhythm.  No gallop.  No rub.  I do not hear a  significant mitral regurgitation murmur.  ABDOMEN:  Obese, soft, nontender, and nondistended.  No masses.  Particularly, there is no tenderness where the patient was hurting in  the mid abdomen.  RECTAL:  Deferred.  GENITOURINARY:  Deferred.  EXTREMITIES:  No significant pitting edema in any of the 4 extremities.  NEUROLOGIC:  Alert and oriented x3.  No asterixis.  Nonfocal.  No motor  deficits.  Cranial nerves are intact.   LABORATORY DATA:  From today, creatinine down to 4.4.  Sodium 138,  potassium 3.9, bicarb 26, estimated GFR is 13, calcium 9.7, total CPK is  383.  Hemoglobin 12, white blood count 11,000, and platelets 323.  Urine  sodium was 51.  UA was as above.  Renal ultrasound as above.   IMPRESSION:  1. Acute renal failure due to contrast nephropathy after recent heart      catheterization 3 days ago.  Also contributing would be the Lasix,      ACE inhibitor, and mitral regurgitation affecting renal      hemodynamics.  The patient is making urine now.  She was probably a      little bit volume depleted and I would agree with continued      intravenous fluids for  now and holding Lasix and lisinopril.  I      suspect the patient will recover fully from this episode.  Thank      you for the referral.  We will follow.  2. Rheumatoid arthritis on  steroids.  3. Recently diagnosed cardiomyopathy with severe mitral regurgitation      and mild congestive heart failure.  4. History of gout.  5. Obstructive sleep apnea.  6. Morbid obesity.  7. Anemia.      Maree Krabbe, M.D.  Electronically Signed     RDS/MEDQ  D:  11/08/2008  T:  11/09/2008  Job:  [id number]

## 2010-09-28 NOTE — Op Note (Signed)
[name], [name]           ACCOUNT NO.:  [id number]   MEDICAL RECORD NO.:  [id number]          PATIENT TYPE:  INP   LOCATION:  2313                         FACILITY:  [facility]   PHYSICIAN:  Salvatore Decent. Cornelius Moras, M.D. DATE OF BIRTH:  09/17/1960   DATE OF PROCEDURE:  12/18/2008  DATE OF DISCHARGE:                               OPERATIVE REPORT   PREOPERATIVE DIAGNOSES:  1. Severe mitral regurgitation.  2. Mild-to-moderate tricuspid regurgitation.  3. Patent foramen ovale.   POSTOPERATIVE DIAGNOSES:  1. Severe mitral regurgitation.  2. Moderate tricuspid regurgitation.  3. Patent foramen ovale.   PROCEDURE:  Right miniature thoracotomy for mitral valve repair (28-mm  Sorin MEMO 3-D ring annuloplasty) tricuspid valve repair (28-mm Edwards  MC3 ring annuloplasty) and closure of patent foramen valve.   SURGEON:  Salvatore Decent. Cornelius Moras, MD   ASSISTANT:  Tasha Acre. Dasovich, PA   ANESTHESIA:  General.   BRIEF CLINICAL NOTE:  The patient is a 50 year old obese African  American female from [place] with no previous cardiac history who was  admitted on October 30, 2008 to the hospital with class IV congestive heart  failure and pulmonary edema.  The patient's past medical history is  notable for hypertension, rheumatoid arthritis, GE reflux disease, and  gout.  The patient has been evaluated in the emergency department on  numerous occasions for severe shortness of breath and cough that  previously has been attributed to bronchitis.  Following hospitalization  in June 2010, the patient was noted to be in pulmonary edema with severe  bilateral lower extremity edema.  The patient was treated medically with  diuretic therapy.  Echocardiogram revealed severe mitral regurgitation  and the patient was seen in consultation by Dr. [name].  The  patient underwent transesophageal echocardiogram on November 03, 2008  confirming the presence of severe mitral regurgitation.  There was at  least  moderate left ventricular dysfunction with ejection fraction  estimated 40% and mild-to-moderate left ventricular chamber enlargement.  There was mild-to-moderate tricuspid regurgitation and a small patent  foramen ovale.  Left and right heart catheterization was performed  demonstrating normal coronary artery anatomy with no significant  coronary artery disease.  There was moderate left ventricular  dysfunction.  There was pulmonary hypertension.  A full consultation  note has been dictated previously.  The patient has since then undergone  dental extraction, now presents for elective surgical intervention.  The  patient and her family have been counseled at length regarding the  indications, risks, and potential benefits of surgery.  Alternative  treatment strategies have been discussed.  They understand and accept  all potential associated risks and desire to proceed as described.   OPERATIVE FINDINGS:  1. Dilated nonischemic cardiomyopathy with at least moderate left      ventricular dysfunction.  2. Functional type I mitral regurgitation with broad central jet of      severe regurgitation (4+).  3. At least moderate (3+) tricuspid regurgitation with dilated      tricuspid valve annulus.  4. Small patent foramen ovale.  5. No residual mitral regurgitation following successful mitral valve  repair.  6. Trace-to-mild residual tricuspid regurgitation following tricuspid      valve repair.   OPERATIVE NOTE IN DETAIL:  The patient was brought to the operating room  on the above-mentioned date and central monitoring was established by  the Anesthesia Team under the care and direction of Dr. Kipp Brood.  Specifically, a Swan-Ganz catheter was placed through the left internal  jugular approach.  A radial arterial line was placed.  Intravenous  antibiotics were administered.  Following induction with general  endotracheal anesthesia, a Foley catheter was placed.  Baseline   transesophageal echocardiogram was performed by Dr. Noreene Larsson.  This  confirms the presence of severe mitral regurgitation secondary to  annular dilatation with incomplete coaptation of the mitral valve  leaflets.  There was no mitral valve prolapse.  The subvalvular  apparatus appears normal.  There was nothing suggest an underlying  rheumatic etiology.  There was at least moderate (3+) tricuspid  regurgitation with a broad central jet of regurgitation and significant  tricuspid annular enlargement.  There is a small patent foramen ovale.  The aortic valve is normal.  Left ventricle is moderately dilated and  hypocontractile.   The patient was positioned with a soft roll behind the right scapula and  the neck gently extended and turned towards the left.  The patient's  right neck, chest, abdomen, both groins, and both lower extremities were  prepared and draped in sterile manner.  A small incision was made in the  right inguinal crease.  The subcutaneous tissues were dissected with  electrocautery.  The anterior surface of the right common femoral artery  and right common femoral vein were dissected through this incision.  The  greater saphenous vein was ligated at the level of the saphenous bulb.  Femoral artery was soft and normal in appearance.   Single lung ventilation was begun.  A right anterolateral thoracotomy  incision was performed and the right pleural space was entered through  the third intercostal space.  A soft tissue retractor was placed.  A  pledgeted suture was placed in the dome of the right hemidiaphragm and  then retracted inferiorly to facilitate exposure.  Two 11-mm  thoracoscopic ports were placed through separate stab incisions in the  inframammary crease into the right pleural space.  The right pleural  space was insufflated with carbon dioxide gas continuously through one  of these ports throughout the remainder of the operation.  The  pericardium was opened  longitudinally 3 cm anterior to the phrenic  nerve.  Silk traction sutures were placed through the pericardium for  retraction.   The patient was heparinized systemically.  A pursestring suture was  placed around the base of the right greater saphenous bulb.  Two  concentric pursestring sutures were placed on the anterior surface of  the right common femoral artery.  The right common femoral vein was then  cannulated through the greater saphenous bulb and a long flexible  guidewire was advanced up through the inferior vena cava, through the  right atrium into the superior vena cava using transesophageal  echocardiogram for guidance.  The femoral vein was dilated with serial  dilators and a 23 x 25-French long femoral venous cannula was advanced  uneventfully until the tip of the cannula was positioned in the superior  vena cava, again using transesophageal echocardiogram for guidance.  The  femoral artery was cannulated with a Seldinger technique and a flexible  guidewire was advanced into the descending thoracic aorta.  The femoral  artery was dilated with serial dilators and an 18-French femoral  arterial cannula was advanced into the iliac artery.  A small stab  incision was made low in the right neck.  The right internal jugular  vein was cannulated with a Seldinger technique and a flexible guidewire  was advanced into the right atrium.  The internal jugular vein was  dilated with serial dilators and a 14-French pediatric femoral venous  cannula was advanced over the guidewire into the superior vena cava.   Cardiopulmonary bypass was begun.  Vacuum-assisted venous drainage was  utilized.  Drainage was excellent.  The pericardial incision was  extended in both directions.  The patient was placed in slight  Trendelenburg position and systemic cooling was begun.  Once the patient  reaches 28 degrees systemic temperature, a left atriotomy incision was  performed posteriorly through the  interatrial groove and continued  partway across the back wall of the left atrium after opening the  oblique sinus inferiorly.  Floor of the left atrium and the mitral valve  were exposed using retractor blade attached to the side arm placed  through a small stab incision just to the right side of the sternum  through the third intercostal space.  Exposure was felt to be excellent.  The mitral valve was carefully examined.  The mitral valve leaflets  appeared essentially normal, very mild thickening of the leaflet  margins.  The subvalvular apparatus was normal.  There was no mitral  valve prolapse.  No other abnormalities were noted.  Anatomical findings  were consistent with functional type I mitral regurgitation.   Ring annuloplasty was performed using interrupted 2-0 Ethibond  horizontal mattress sutures placed circumferentially around the entire  mitral annulus.  The mitral valve was sized to a 28-mm ring which  corresponds fairly precisely to the dimensions of the anterior leaflet.  A Sorin MEMO 3-D annuloplasty ring (catalog number [id number], serial number  [id number]) was secured in place uneventfully.  After completion of valve  repair, the valve was tested with saline and appears to be perfectly  competent with a broad symmetrical line of coaptation of the anterior  and posterior leaflets.   Left ventricular vent was placed across the mitral valve and the left  atriotomy incision was subsequently closed in two layers with running 3-  0 Prolene suture.  The inferior vena cava cannula was now pulled down  until the tip of the cannula was just below the junction between the  inferior vena cava and right atrium.  An umbilical tape was placed  around the superior vena cava and snared.  A longitudinal incision was  made in the right atrium oriented the obliquely extending from just  anterior to the right inferior pulmonary vein in an oblique inferior  direction towards the acute margin of  the right ventricle.  A retractor  blade was placed and the tricuspid valve was exposed.  The tricuspid  valve appears normal.  The right ventricle was enlarged corresponding to  the enlargement of the tricuspid annulus.   Ring annuloplasty was performed on the tricuspid valve using interrupted  2-0 Ethibond horizontal mattress sutures placed circumferentially around  the tricuspid annulus with exception of small gap on the annulus  immediately below the triangle of Koch in the AV node.  The valve was  sized to accept a 28-mm annuloplasty ring, and Edwards MC3 annuloplasty  ring (model number 4900, serial number [id number]) was secured in place  uneventfully.  After  completion of the tricuspid valve repair, the valve  was tested with saline and appears to be competent despite placement of  the Swan-Ganz catheter and the pulmonary artery.   The patient was rewarmed to 37 degrees centigrade temperature.  The  right atriotomy incision was closed with a two-layer closure of running  3-0 Prolene suture.  Epicardial pacing wires were fixed to the  undersurface of the right ventricular free wall.  Atrial pacing wires  were fixed to the right atrial appendage.  The snare around the superior  vena cava was removed.  The left ventricular vent was removed.  Low-dose  dopamine infusion was begun at 3 mcg/kg/min.  The patient was weaned  from cardiopulmonary bypass without difficulty.  The patient's rhythm at  separation from bypass is normal sinus rhythm.  Total cardiopulmonary  bypass time for the operation is 196 minutes.   Followup transesophageal echocardiogram was performed by Dr. Noreene Larsson  after separation from bypass demonstrates well-seated annuloplasty ring  in the mitral position with normally functioning mitral valve and no  residual mitral regurgitation.  There was a well-seated annuloplasty  ring in the tricuspid position and trace-to-mild residual tricuspid  regurgitation with Swan-Ganz  catheter in place.  There was no residual  patent foramen ovale.  Left ventricular function remains moderately  reduced, similar to preoperatively.  No other abnormalities were noted.   Protamine was administered to reverse the anticoagulation.  The femoral  venous cannula was removed uneventfully and the greater saphenous bulb  was ligated.  The femoral arterial cannula was removed and the  cannulation suture was secured.  There was a palpable pulse in the  distal right femoral artery.  The right internal jugular cannula was  removed and the cannulation site controlled with the everting pledgeted  suture and manual compression.   Single lung ventilation was begun.  The left and right atriotomy  incisions were inspected for hemostasis.  The pericardial space was  drained with a 28-French Bard drain exited through the anterior port  incision.  The pericardium was closed using a patch of CorMatrix bovine  submucosal tissue matrix framework.  The right chest was irrigated with  saline solution.  The right pleural space was drained with a 28-French  Bard drain exited through separate stab incision inferiorly.  The  thoracotomy was closed in the subcutaneous tissues anterior to the  pectoralis muscle are drained with a 15-French Blake drain due to the  patient's severe obesity.  The incision was closed in multiple layers.  The right groin incision was inspected for hemostasis, irrigated with  saline solution and subsequently closed in layers.  All skin incisions  were closed with subcuticular skin closures.   The patient tolerated the procedure well.  The patient was reintubated  using a single-lumen endotracheal tube and subsequently transported to  the surgical intensive care unit in stable condition.  There are no  intraoperative complications.  All sponge, instrument, and needle counts  were verified and correct at completion of the operation.  No blood  products were  administered.      Salvatore Decent. Cornelius Moras, M.D.  Electronically Signed     CHO/MEDQ  D:  12/18/2008  T:  12/19/2008  Job:  [id number]   cc:   [name], MD  Maurice March, M.D.

## 2010-09-28 NOTE — Consult Note (Signed)
NAMETAKENYA, TRAVAGLINI           ACCOUNT NO.:  [id number]   MEDICAL RECORD NO.:  [id number]          PATIENT TYPE:  INP   LOCATION:                               FACILITY:  [facility]   PHYSICIAN:  Charlynne Pander, D.D.S.DATE OF BIRTH:  10-23-1960   DATE OF CONSULTATION:  12/01/2008  DATE OF DISCHARGE:                                 CONSULTATION   HISTORY:  [name] is a 50 year old female referred by Dr.  Tressie Stalker for dental consultation.  Patient with recent  identification of severe mitral regurgitation with heart failure.  Patient with anticipated mitral valve heart surgery with Dr. Cornelius Moras.  The  patient is now seen as part of a pre heart valve surgery dental protocol  evaluation.   MEDICAL HISTORY:  1. Severe mitral regurgitation with anticipated mitral valve      repair/replacement as indicated with Dr. Cornelius Moras.      a.     Status post transesophageal echocardiogram with Dr. Shirlee Latch       on November 03, 2008 which revealed severe mitral regurgitation,       decreased left ventricular function, ejection fraction of 40%       small patent foramen ovale, and mild to moderate tricuspid       regurgitation.      b.     Status post cardiac catheterization on November 04, 2008 with       Dr. Shirlee Latch revealed no significant coronary artery disease.  2. Congestive heart failure class IV.  3. Hypertension.  4. History of rheumatoid arthritis.  5. History of gastroesophageal reflux disorder.  6. History of gout.  7. Obstructive sleep apnea with CPAP at bedtime.  8. Chronic anemia.  9. Carpal tunnel syndrome involving both hands with no surgery.  10.Status post right knee arthroscopic knee surgery for a torn      ligament.   ALLERGIES:  COLCHICINE causes hives and lip swelling.   MEDICATIONS:  1. Ventolin inhalation therapy 2 puffs every 6 hours as needed.  2. Celexa 20 mg at bedtime.  3. Aspirin 81 mg daily.  4. Coreg 6.25 mg twice daily.  5. Flexeril 10 mg twice daily for  spasms.  6. Protonix 40 mg daily.  7. Vicodin 7.5/750 one every 12 hours as needed for pain.   SOCIAL HISTORY:  The patient is single.  The patient has 5 children.  The patient currently is living with one of her daughters.  The patient  quit smoking in June 2010 from a one half to one pack per day cigarette  smoking habit since the age of 52.  The patient denies use of alcohol.   FAMILY HISTORY:  Mother is alive at age of 4 with a history of nervous  breakdown and is otherwise relatively healthy.  Father deceased of  unknown causes, unknown age.   FUNCTIONAL ASSESSMENT:  The patient remains independent for basic ADLs.  The patient does have some shortness of breath which does bother her.  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  DENTAL HISTORY:   CHIEF COMPLAINT:  The patient seen as part of her pre heart valve  surgery dental protocol evaluation.   HISTORY OF PRESENT ILLNESS:  The patient recently identified with severe  mitral regurgitation.  Patient with anticipated mitral valve/repair with  Dr. Cornelius Moras in the future.  Patient is now seen as part of her pre heart  valve surgery dental protocol to rule out dental infection that may  affect the patient's systemic health and anticipated heart valve  surgery.   The patient currently denies acute toothache, swellings or abscesses.  The patient was last seen by dentist at [facility] to have a lower  left molar extracted.  The patient denies complications from that dental  extraction.  Prior to that the patient had not been seen for 2-3 years.  The patient indicates that an upper acrylic partial denture was made at  that time and fits all right..   DENTAL EXAMINATION:  GENERAL:  The patient is a well-developed, well-  nourished female in no acute distress.  VITAL SIGNS:  Blood pressure is 132/74, pulse is equal to 102,  temperature 97.5.  Repeat  vitals 5 minutes later were blood pressure  120/81, pulse is equal to 93.  The patient had some shortness of breath  initially but the patient given oxygen via nasal cannula 2 liters per  minute and was maintained on at 98-100% oxygen saturation over the next  90 minutes.  The patient was eventually dismissed in stable condition  with normal oxygenation level set at 97 with removal of the oxygen.  HEAD AND NECK:  There was no significant lymphadenopathy.  The patient  denies acute TMJ symptoms.  INTRAORAL:  Patient with bilateral mandibular lingual tori that are  small.  No evidence of abscess formation within the mouth.  DENTITION:  Patient with multiple missing teeth numbers 1, 4, 5, 6, 7,  8, 9, 10 12, 13, 16, 17, 19, 20, 28, 29, 30 and 32.  PERIODONTAL:  Patient with chronic advanced periodontal disease with  plaque and calculus accumulations, generalized gingival recession and  generalized tooth mobility.  DENTAL CARIES:  There are multiple dental caries as per dental charting  form.  ENDODONTIC:  Patient currently denies a history of acute pulpitis  symptoms.  The patient does have significant dental caries which may be  impinging on the pulp.  CROWN OR BRIDGE:  There are no crown or bridge restorations.  PROSTHODONTIC:  The patient has an upper acrylic partial denture which  is ill-fitting.  The patient has no lower partial denture.  OCCLUSION:  Patient with a poor occlusal scheme secondary to multiple  missing teeth, supereruption and drifting of the unopposed teeth into  the edentulous areas and lack of replacement of all missing teeth with  dental prostheses.   RADIOGRAPHIC INTERPRETATION:  A panoramic x-ray was taken and  supplemented with nine periapical radiographs and one bite wing.   There are multiple missing teeth.  There is moderate to severe bone  loss.  There are dental caries noted.  There is a realtively new  extraction site in the area of #19.  There is no  obvious periapical  pathology noted.  There are dental caries noted.   ASSESSMENT:  1. Chronic periodontitis with bone loss.  2. Plaque and calculus accumulations.  3. Generalized gingival recession.  4. Generalized tooth mobility.  5. Rampant dental caries.  6. Multiple missing teeth.  7. Supereruption and drifting of the unopposed teeth into  the      edentulous areas.  8. Ill-fitting maxillary partial denture with no lower partial      denture.  9. Poor occlusal scheme.  10.Bilateral mandibular lingual tori.  11.Significant cardiovascular compromise with potential risks with      invasive dental procedures up to including death.   PLAN/RECOMMENDATIONS:  1. I discussed risks, benefits, complications and various treatment      option with the patient in relationship to her medical and dental      conditions and anticipated heart valve surgery.  We discussed      various treatment options to include no treatment, total and      subtotal extractions with alveoloplasty, pre prosthetic surgery as      indicated, periodontal therapy, dental restorations, root canal      therapy, crown or bridge therapy, implant therapy, and replacing      missing teeth as indicated after adequate healing.  The patient      currently wishes to proceed with extraction of all remaining teeth      with alveoloplasty, pre prosthetic surgery as indicated in the      operating room on December 04, 2008 at [facility] at 7:30      a.m..  This is depending upon ability to do so after further      discussion with doctors [name] and Dr. Tressie Stalker as      indicated.  The patient will then follow up with a dentist of her      choice for fabrication of upper and lower complete dentures after      adequate healing.  2. Discussion of findings with Dr. Shirlee Latch and Dr. Cornelius Moras as indicated to      ensure the medical ability/stability of the patient to undergo      procedures as planned.      Charlynne Pander, D.D.S.  Electronically Signed     RFK/MEDQ  D:  12/01/2008  T:  12/02/2008  Job:  [id number]   cc:   [name], MD  Salvatore Decent. Cornelius Moras, M.D.  Maurice March, M.D.  Dental Medicine  Presurgical Testing

## 2010-09-28 NOTE — Op Note (Signed)
[name], [name]           ACCOUNT NO.:  [id number]   MEDICAL RECORD NO.:  [id number]          PATIENT TYPE:  INP   LOCATION:  3737                         FACILITY:  [facility]   PHYSICIAN:  Charlynne Pander, D.D.S.DATE OF BIRTH:  1960-12-01   DATE OF PROCEDURE:  12/04/2008  DATE OF DISCHARGE:                               OPERATIVE REPORT   PREOPERATIVE DIAGNOSES:  1. Severe mitral regurgitation.  2. Pre-mitral valve replacement dental protocol.  3. Chronic periodontitis.  4. Dental caries.  5. Mandibular tori.  6. History of sleep apnea.   POSTOPERATIVE DIAGNOSES:  1. Severe mitral regurgitation.  2. Pre-mitral valve replacement dental protocol.  3. Chronic periodontitis.  4. Dental caries.  5. Mandibular tori.  6. History of sleep apnea.   OPERATIONS:  1. Extraction of remaining teeth (tooth #2, #3, #11, #14, #15, #18,      #21, #22, #23, #24, #25, #26, #27 and #31).  2. Four quadrants of alveoloplasty.  3. Bilateral mandibular lingual tori reductions.   SURGEON:  Charlynne Pander, DDS   ASSISTANT:  Zettie Pho (dental assistant).   ANESTHESIA:  General anesthesia via nasoendotracheal tube.  Sheldon Silvan,  MD, attending.   MEDICATIONS:  1. Ancef 1 g IV prior to invasive dental procedures.  2. Local anesthesia with total utilization of 5 carpules each      containing 34 mg of lidocaine with 0.017 mg of epinephrine as well      as 2 carpules each containing 9 mg of bupivacaine with 0.009 mg of      epinephrine.   SPECIMENS:  There were 14 teeth discarded.   DRAINS:  None.   CULTURES:  None.   COMPLICATIONS:  None.   ESTIMATED BLOOD LOSS:  100 mL.   INTRAVENOUS FLUIDS:  Lactated Ringer's --2 liters.   INDICATIONS:  The patient was recently diagnosed with severe mitral  regurgitation.  The patient with anticipated mitral valve replacement  heart surgery.  Dental consultation was requested to evaluate and rule  out dental infection that may  affect the patient's systemic health and  anticipated mitral valve surgery.  The patient was examined and  treatment planned for extraction of remaining teeth with alveoloplasty  and pre-prosthetic surgery was indicated.  This treatment plan again was  formulated to decrease the risks and complications associated with  dental infection from affecting the patient's systemic health and heart  valve surgery.   OPERATIVE FINDINGS:  The patient was examined in operating room #2.  The  teeth were identified for extraction.  The patient noted to be affected  by chronic periodontitis, dental caries, malocclusion, and the presence  of mandibular tori. The aforementioned necessitated the removal of all  remaining teeth with alveoloplasty and pre-prosthetic surgery as  indicated.   DESCRIPTION OF PROCEDURE:  The patient was brought to the Main Operating  Room #2.  The patient was then placed in the supine position on the  operating room table.  General anesthesia was then induced via  nasoendotracheal tube.  The patient was then prepped and draped in the  usual manner for dental medicine procedure.  A  time-out was performed.  The patient was identified and procedures were verified.  The throat  pack was placed at this time.  The oral cavity was then thoroughly  examined with findings as noted above.  The patient was then ready for  the dental medicine procedure as follows:   Local anesthesia was administered sequentially with a total utilization  of 5 carpules each containing 34 mg of lidocaine with 0.017 mg of  epinephrine as well as 2 carpules containing 9 mg of bupivacaine with  0.009 mg of epinephrine.   The maxillary left and right quadrants were first approached.  Anesthesia was delivered via infiltration to the maxillary arches  utilizing the lidocaine with epinephrine.  At this point in time, the  maxillary right quadrant was approached.  A 15-blade incision was made  from the  maxillary right tuberosity extended to the mesial of #7.  The  surgical flap was then carefully reflected.  Buccal and interseptal bone  were then removed around tooth #2 and #3 with a surgical handpiece and  bur and copious amounts of sterile saline.  These teeth were then  subluxated with a series of straight elevators.  Tooth #3 was then  removed with a 53R forceps without complication.  The coronal aspect of  the tooth #2 was then removed with a 53R forceps leaving the roots  remaining.  The roots were then sectioned appropriately utilizing the  surgical handpiece and bur and copious amounts of sterile saline.  A  series of Cryer elevators were utilized to remove the remaining roots  appropriately.  Alveoloplasty was then performed utilizing rongeurs and  bone file.  The tissues were then approximated and trimmed  appropriately.  The surgical site was then irrigated with copious  amounts of sterile saline.  The surgical site was then closed from the  maxillary right tuberosity and extended to the mesial #7 utilizing 3-0  chromic gut suture in a continuous interrupted suture technique x1.   At this point in time, the mandibular left quadrant was approached.  A  15-blade incision was then made from the maxillary left tuberosity and  extended to the mesial of #9.  Surgical flap was then carefully  reflected.  Appropriate amounts of buccal and interseptal bone were  removed around tooth #14, #15 and #11 appropriately.  A surgical  handpiece and bur and copious amounts of sterile saline were utilized.  The teeth were then subluxated with a series of straight elevators.  Tooth #11 was then removed with a 150 forceps without complication.  The  coronal aspects of tooth #14 and #15 were then removed with a 53R  forceps leaving the roots remaining.  The surgical handpiece and bur and  copious amounts of sterile saline were utilized to section the roots  appropriately.  The roots were then  elevated out with a series of Cryer  elevators and rongeurs appropriately.  Alveoplasty was then again  performed utilizing rongeurs and bone file.  The surgical site was then  irrigated with copious amounts of sterile saline.  The surgical site was  then closed from the maxillary left tuberosity and extended to the  mesial #9 utilizing 3-0 chromic gut suture in a continuous interrupted  suture technique x1.  Two individual interrupted sutures were then  placed to further close the surgical site.   At this point in time, the mandibular quadrants were approached.  The  patient was given bilateral inferior alveolar nerve blocks utilizing the  bupivacaine with  epinephrine.  Further infiltration was then achieved  utilizing the lidocaine with epinephrine.  A 15-blade incision was then  made from the distal #17 and extended to the mesial of #27.  A surgical  flap was then carefully reflected.  Appropriate amounts of buccal and  interseptal were removed around tooth #18, #21, and #22 with a surgical  handpiece and bur and copious amounts of sterile saline.  The teeth were  then subluxated with a series of straight elevators.  Tooth #18 then had  the coronal aspect removed with a 17 forceps leaving the roots  remaining.  The roots were then sectioned with a surgical handpiece and  bur and copious amounts of sterile saline.  The roots were then elevated  out with a series of Cryer elevators.  Tooth #21 was then approached and  removed with a 151 forceps.  Tooth #22, #23, #24, #25, and #26 were then  removed with a 151 forceps without complications.  At this point in  time, the mandibular left torus was visualized and removed with a  surgical handpiece and bur and copious amounts of sterile saline.  The  surgical site was then irrigated with copious amounts of sterile saline.  The alveoplasty was then performed utilizing rongeurs and bone file.  The tissues were then approximated and trimmed  appropriately.  Surgical  site was then again irrigated with copious amounts of sterile saline.  The surgical site was then closed from the distal #17 and extended to  the mesial #24 utilizing 3-0 chromic gut suture in a continuous  interrupted suture technique x1.  Two individual interrupted sutures  were then placed to further close the surgical site.   At this point in time, the mandibular right quadrant was approached.  A  15-blade incision was then made from distal #32 and extended to the  mesial of #27.  The surgical flap was then carefully reflected.  Appropriate amounts of buccal and interseptal bone were removed around  tooth #27 and #31 appropriately with a surgical handpiece and bur and  copious amounts of sterile saline.  The coronal aspect of tooth #31 was  then removed with a 17 forceps leaving the roots remaining.  The roots  were then sectioned appropriately with a surgical handpiece and bur.  The roots were then elevated out with a series of Cryer elevators  without complication.  Tooth #27 was then approached and removed with a  151 forceps without complications.  Alveoplasty was then performed  utilizing rongeurs and bone file.  At this point in time, the flap was  reflected to expose the mandibular right torus.  This was then removed  with a surgical handpiece and bur and copious amounts of sterile saline.  The tissues were then approximated and trimmed appropriately.  The  surgical site was then irrigated with copious amounts of sterile saline.  The surgical site was then closed from the distal #32 and extended to  the mesial #25 utilizing 3-0 chromic gut suture in a continuous  interrupted suture technique x1.   At this point in time, the entire mouth was irrigated with copious  amounts of sterile saline.  The patient was examined for complications,  seeing none, the dental medicine procedure was deemed to be complete.  The throat pack was removed at this time.  A  series of 4 x 4 gauzes were  then placed in the mouth to aid hemostasis.  An oral airway was then  placed at the request  of the anesthesia team.  The patient was then  handed over to the anesthesia team for final disposition.  After the  appropriate amount of time, the patient was extubated and taken to the  postanesthesia care unit with stable vital signs in good condition.  All  counts were correct for the dental medicine procedure.  The patient will  be kept overnight and admitted through [facility] for 23-hour  observation  due to the history of sleep apnea.  The patient will be seen in  approximately 7-10 days for evaluation for suture removal.  The patient  is to be scheduled for anticipated mitral valve replacement surgery with  Dr. Cornelius Moras as per his protocols.      Charlynne Pander, D.D.S.  Electronically Signed     RFK/MEDQ  D:  12/04/2008  T:  12/05/2008  Job:  [id number]   cc:   [name], MD  Salvatore Decent. Cornelius Moras, M.D.

## 2010-09-28 NOTE — Assessment & Plan Note (Signed)
OFFICE VISIT   LUCKY, ALVERSON  DOB:  Aug 18, 1960                                        December 15, 2008  CHART #:  [id number]   The patient returns to the office today for further followup and  counseling prior to surgery tentatively scheduled later this week.  She  was originally seen in consultation on November 24, 2008, and a full history  and physical exam was dictated at that time.  Since then she has  remained clinically stable.  She underwent dental extraction  successfully on December 04, 2008, and she has gotten over that nicely.  She  reports stable symptoms of shortness of breath with activity.  She has  not had any fevers or chills.  She has not had any cough.  She has not  had any worsening of her symptoms of shortness of breath and the  remainder of her review of systems is unchanged from previously.   I spent an excess of 30 minutes reviewing the indications, risks, and  potential benefits of surgery with the patient and her entire family  here in the office today.  We discussed the rationale for proceeding  with surgery as well as long-term prognosis without surgical  intervention.  All of their questions have been addressed.  We will plan  to proceed with mitral valve repair via minimally invasive approach.  She understands that there is always a small chance that her valve  cannot be repaired at which time we would have to replace it.  Based  upon her age, we would tend to favor use of a mechanical prosthesis, and  she understands that this would come with need for long-term  anticoagulation with Coumadin and all associated risks.  Nevertheless,  we are hopeful that valve repair will be feasible such that none of this  discussion would be prudent.  All of their questions have been  addressed.  They understand and accept all potential associated risks  including but not limited to risk of death, stroke, myocardial  infarction, congestive heart  failure, respiratory failure, renal  failure, bleeding requiring blood transfusion, arrhythmia, heart block  or bradycardia requiring permanent pacemaker, late complications related  to valve repair, long-term  complications related to underlying left ventricular dysfunction, and  potential for further problems of congestive heart failure.  All of  their questions have been addressed.   Salvatore Decent. Cornelius Moras, M.D.  Electronically Signed   CHO/MEDQ  D:  12/15/2008  T:  12/16/2008  Job:  [id number]   cc:   [name], MD  Maurice March, M.D.

## 2010-09-28 NOTE — Discharge Summary (Signed)
[name], [name]           ACCOUNT NO.:  [id number]   MEDICAL RECORD NO.:  [id number]          PATIENT TYPE:  INP   LOCATION:  3737                         FACILITY:  [facility]   PHYSICIAN:  [name], MD      DATE OF BIRTH:  1961-02-14   DATE OF ADMISSION:  12/04/2008  DATE OF DISCHARGE:  12/05/2008                               DISCHARGE SUMMARY   PROCEDURES:  1. Extraction of remaining teeth.  2. Four quadrants of alveoloplasty  3. Bilateral mandibular lingual tori reductions.  4. General and local anesthesia.   PRIMARY FINAL DISCHARGE DIAGNOSES:  Multiple missing teeth with  supereruption and drifting of the unopposed teeth and to the edentulous  area, ill-fitting maxillary partial denture with no lower partial  denture as well as poor occlusal scheme, bilateral mandibular lingual  tori, and chronic periodontitis with bone loss as well as plaquing  calculus accumulation, generalized gingival recession, and generalized  tooth mobility with rampant dental caries.   SECONDARY DIAGNOSES:  1. Severe mitral regurgitation with anticipated mitral valve      repair/replacement per Dr. Cornelius Moras.  2. 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  3. Chronic systolic and diastolic congestive heart failure.  4. Hypertension.  5. History of rheumatoid arthritis.  6. Gastroesophageal reflux disease.  7. History of gout.  8. Obstructive sleep apnea with CPAP.  9. Chronic anemia.  10.Carpal tunnel syndrome.  11.Status post right knee arthroscopic surgery.  12.Allergy to COLCHICINE.  13.Recent history of tobacco use.   TIME OF DISCHARGE:  39 minutes.   HOSPITAL COURSE:  [name] is a 50 year old female who was evaluated  in June and found to have no coronary artery disease but severe mitral  regurgitation.  In preparation of mitral valve surgery, she was seen by  Dr. Cornelius Moras.  He  recommended dental evaluation and she was seen by Dr.  Kristin Bruins on December 01, 2008.  She was scheduled for dental surgery on December 04, 2008, and came to the hospital for the procedure.   Cardiology saw her during her hospital stay and managed her medications.  She had the procedure on December 04, 2008, and tolerated it well.  On December 05, 2008, she had no signs of active bleeding with sutures intact.  She  had minimal ecchymosis and swelling.  Dr. Kristin Bruins evaluated [name] and felt that she was stable for discharge.  She was evaluated  by Dr. Shirlee Latch who also felt she was stable for discharge with close  outpatient followup.   DISCHARGE INSTRUCTIONS:  1. Activity level is to be increased gradually.  2. She is okay to shower.  3. She is to a follow up with Dr. Shirlee Latch next week as scheduled.  4. She is to follow up with Dr. Kristin Bruins and call for an appointment.  5. She is to follow up with Dr. Cornelius Moras as scheduled.  6. She is to follow up with [facility] as scheduled.   DISCHARGE MEDICATIONS:  1. Cyclobenzaprine  10 mg b.i.d. p.r.n.  2. Lasix 40 mg b.i.d.  3. Protonix 40 mg daily.  4. Carvedilol 6.25 mg b.i.d.  5. Lisinopril 20 mg a day.  6. Celexa 20 mg a day.  7. Hydrocodone is discontinued.  8. Aspirin 81 mg a day.  9. Albuterol MDI 1-2 puffs p.r.n.  10.Magic mouth wash t.i.d.  11.Percocet 5/325 p.r.n.      Theodore Demark, PA-C      [name], MD  Electronically Signed    RB/MEDQ  D:  12/05/2008  T:  12/06/2008  Job:  [id number]   cc:   Hilliard Clark Cornelius Moras, M.D.

## 2010-09-28 NOTE — Assessment & Plan Note (Signed)
OFFICE VISIT   [name], [name]  DOB:  1961-02-19                                        January 12, 2009  CHART #:  [id number]   HISTORY:  The patient returns to the office today for further followup  status post right miniature thoracotomy for mitral valve repair,  tricuspid valve repair, and closure of patent foramen ovale on December 18, 2008.  She was last seen here in the office on December 29, 2008.  Since  then, she has continued to do quite well.  The patient reports that she  feels better now than she did prior to surgery.  She is getting around  well without any shortness of breath.  Her breathing is much better than  it was prior to surgery.  She has no fevers, chills, or productive  cough.  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  She has mild residual pain and numbness in the right anterior  chest wall and under her breast that is expected and related to her  surgery.  This is not bothering her.  Her activity level is quite good,  and she is now getting around doing fairly well.  She has not yet seen  Dr. Shirlee Latch for followup at [facility].  She has an  appointment scheduled to see him next month.   PHYSICAL EXAMINATION:  General:  Notable for well-appearing obese  female.  Vital Signs:  Blood pressure 116/82, pulse 90 and regular,  oxygen saturation 98% on room air.  Chest:  Demonstrates the fact that  her miniature anterior thoracotomy incision is healing quite nicely.  The small chest tube incisions under her breasts are also healing,  although the skin edges have not healed over completely.  There is no  sign of infection.  Auscultation demonstrates clear breath sounds, which  are symmetrical.  No wheezes or rhonchi are noted.  Cardiovascular:  Regular rate and rhythm.  No murmurs, rubs, or gallops are noted.  Extremities:  Warm and well perfused.  The right groin incision is  healing  appropriately, although it has not healed completely.  There is  no drainage, and there is no fluctuance to suggest any sort of wound  infection.   DIAGNOSTICS TESTS:  Chest x-ray performed at the [facility] today is reviewed.  This demonstrates clear lung fields  bilaterally with mild residual opacity at the right lung base that is  improved from the last previous chest x-ray.  There are no significant  pleural effusions.  No other significant abnormalities are noted.   IMPRESSION:  The patient is doing remarkably well, approximately 3-1/2  weeks status post right miniature thoracotomy for mitral valve repair,  tricuspid valve repair, and closure of patent foramen ovale.   PLAN:  I have encouraged the patient to continue to gradually increase  her physical activity as tolerated without any specific physical  limitations at this point in time.  I have cautioned her to keep an eye  on her 2 small chest tube incisions and the incision in her right groin  and to keep them clean and dry.  Overall, she looks quite good.  We have  not changed any of her current medications.  All of her questions have  been addressed.  We will  plan to see her back in 3 months' time for  further routine late followup.   Salvatore Decent. Cornelius Moras, M.D.  Electronically Signed   CHO/MEDQ  D:  01/12/2009  T:  01/13/2009  Job:  [id number]   cc:   [name], MD  Maurice March, M.D.

## 2010-09-28 NOTE — Consult Note (Signed)
[name], [name]           ACCOUNT NO.:  [id number]   MEDICAL RECORD NO.:  [id number]          PATIENT TYPE:  INP   LOCATION:  4731                         FACILITY:  [facility]   PHYSICIAN:  Unice Cobble, MD     DATE OF BIRTH:  [birth year]   DATE OF CONSULTATION:  10/30/2008  DATE OF DISCHARGE:                                 CONSULTATION   CHIEF COMPLAINT:  Shortness of breath and edema.   HISTORY OF PRESENT ILLNESS:  This is a 50 year old African American  female with a history of hypertension, obstructive sleep apnea,  rheumatoid arthritis and history of leg clot who presents with  shortness of breath and edema.  The patient has had problems with  shortness of breath for about a year, and this is thought to be due to  her obstructive sleep apnea and obesity.  She has had very minimal  swelling during this time frame, but recently in the last few months and  more specifically in the last week her leg swelling has gotten much  worse.  She tells me that this week in particular about 5 days ago, her  shortness of breath and leg swelling increased dramatically.  She has  not had any chest pain.  She had a syncopal episode 2 weeks ago after  taking her medications, but has not had any sense.  She complains of  orthopnea and PND.   PAST MEDICAL HISTORY:  1. Hypertension.  2. GERD.  3. Obstructive sleep apnea.  4. Rheumatoid arthritis.  5. Bronchitis.  6. Gout.  7. Status post tubal ligation.  8. Carpal tunnel syndrome.  9. History of leg clots.   ALLERGIES:  COLCHICINE CAUSES SWELLING AND HIVES.   MEDICATIONS:  Clonidine, Xanax, Vicodin, Lasix, Ventolin, Protonix,  cyclobenzaprine, prednisone.   SOCIAL HISTORY:  She lives in [place] alone.  She has her daughters  in her room with her.  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  She smokes six cigarettes a day.  Occasional alcohol.  No  drugs.   FAMILY HISTORY:  No family history of heart attacks,  strokes, or  pulmonary embolism.   REVIEW OF SYSTEMS:  She complains of some mild hemorrhoids but otherwise  complete review of systems was done and found to be negative except as  stated in the HPI.   PHYSICAL EXAMINATION:  VITAL SIGNS:  Temperature 97.4, pulse 107,  respiratory rate 22, blood pressure is 139/87, O2 sats are 100% on 2  liters.  GENERAL:  She is obese in no acute distress.  HEENT:  Shows NCAT, PERRLA, EOMI, MMM, oropharynx without erythema or  exudates.  NECK:  Supple without lymphadenopathy, thyromegaly, bruits.  JVD to mid  neck (approximately 10 cm).  HEART:  Regular rate and rhythm with a normal S1-S2.  No murmurs,  gallops or rubs.  Pulses 2+ and equal bilaterally without bruits.  LUNGS:  Clear to auscultation bilaterally.  SKIN:  No rashes or lesions.  ABDOMEN:  Soft and nontender with normal bowel sounds.  No rebound or  guarding.  EXTREMITIES:  Show no cyanosis or  clubbing.  She has 2 to 3+ edema  bilaterally.  NEUROLOGICAL:  She is alert and oriented x3 with cranial nerves II-XII  grossly intact.  Strength is 5/5 all extremities and axial groups with  normal sensation throughout.   RADIOLOGY:  Chest x-ray shows cardiac enlargement and vascular  congestion.  EKG shows a rate of 106 with a sinus tachycardia.  She has  Q's in AVL.  Otherwise, no ST or T-wave changes.  She has possible right  atrial enlargement.   LABORATORY DATA:  Labs received so far include a hemoglobin of 11.9 with  a creatinine of 0.9.  Glucose is 102.  BNP is elevated to 160.  CK-MB  and troponin are both negative.   ASSESSMENT/PLAN:  This is a 50 year old African American female with a  history of hypertension, obstructive sleep apnea, smoking, rheumatoid  arthritis, and leg clots who presents with new-onset congestive heart  failure.  On her chest x-ray, her right heart is markedly enlarged  meaning high pulmonary pressures.  The differential diagnosis includes  pulmonary  embolism (for which she has risk factors of rheumatoid  arthritis, smoking, and a history of a leg clot), pulmonary hypertension  secondary to obstructive sleep apnea, and left-sided heart failure and.  She will need D-dimer and CTPA if the D-dimer is positive for rule out  pulmonary embolism.  She would need an echocardiogram in the morning to  look at her pulmonary pressures as well as her left ventricular  function.  She will likely end up getting a right and left heart cath to  evaluate her pulmonary pressures and coronary vasculature to rule out  ischemic etiology.  If all of these are negative then a viral etiology  is possible, but she does not complain of any recent illness.   OVERNIGHT RECOMMENDATIONS:  Diuresis, which I would do at 40 mg of IV  Lasix two to three times a day depending on her creatinine and urine  output.  She would likely benefit from an ACE inhibitor at low dose and  a nonselective beta blocker such as Coreg for her heart failure.  This  could be done in place of clonidine.  A good regimen would be lisinopril  2.5 mg daily with Coreg 12.5 mg b.i.d.  These could be started as the  clonidine is weaned to avoid rebound defect.  Smoking cessation is  essential and was counseled.  She also needs to lose weight and this was  also counseled.  She will need to be n.p.o. overnight for possible heart  catheterization tomorrow.      Unice Cobble, MD  Electronically Signed     ACJ/MEDQ  D:  10/30/2008  T:  10/31/2008  Job:  [id number]

## 2010-09-28 NOTE — Discharge Summary (Signed)
[name], [name]           ACCOUNT NO.:  [id number]   MEDICAL RECORD NO.:  [id number]          PATIENT TYPE:  INP   LOCATION:  4731                         FACILITY:  [facility]   PHYSICIAN:  Charlestine Massed, MDDATE OF BIRTH:  April 20, 1961   DATE OF ADMISSION:  10/30/2008  DATE OF DISCHARGE:                               DISCHARGE SUMMARY   INTERIM DISCHARGE SUMMARY   PRIMARY CARE PHYSICIAN:  Unassigned.   CARDIOLOGY:  [facility].   GASTROENTEROLOGIST:  Jordan Hawks. Elnoria Howard, M.D.   REASON FOR ADMISSION:  Progressively worsening shortness of breath and  lower extremity swelling.   DISCHARGE DIAGNOSES:  1. Congestive heart failure secondary to severe mitral regurgitation.  2. Severe mitral regurgitation.  3. Microcytic anemia secondary to chronic gastric blood loss.  4. Linear gastric ulcers on capsular endoscopy.  5. Morbid obesity.  6. Obstructive sleep apnea, on CPAP.  7. Acid-reflux disease.  8. Hypertension, currently controlled.  9. Rheumatoid arthritis, currently on prednisone.  10.Gout, currently stable.  Allergic to COLCHICINE.   MEDICATIONS:  The final to the medications will be dictated at the time  of discharge by the discharging physician.   HOSPITAL COURSE:  1. CHF/severe MR and hypertension.  Patient was admitted with      progressively worsening shortness of breath and was found to have      orthopnea but did not have paroxysmal nocturnal dyspnea.  Initial      evaluation showed clinical CHF.  Patient had an echocardiogram      which showed severe mitral regurgitation.  In view of severe MR,      further TEE was done to evaluate the severity of the MR and was      found to have 2 ascending jets and having severe mitral      regurgitation was confirmed.  Patient underwent cardiac      catheterization, right and left, heart catheterization today.  No      significant atherosclerotic coronary artery disease was found, so      the CHF has been proven  to be cardiomegaly secondary to mitral      regurgitation.  Cardiology has consulted CT surgery for mitral      valve replacement and is still pending.  Currently, blood pressure      is controlled with after-load reducers, especially lisinopril 40 mg      daily. She is also continued on Coreg at 6.5 mg b.i.d. and Lasix 80      mg p.o. daily.  Her peripheral edema is being decreased      conservatively.  2. Anemia of chronic blood loss.  Patient's hemoccult was positive.      She was having microcytic anemia.  In view of that, she said that      her cycles are pretty fair and regular.  In view of this,      gastroenterology was consulted to find out any source of chronic      blood loss.  She had an upper EGD and colonoscopy.  The upper EGD      revealed the evidence of linear gastric ulcerations  with gastritis.      Patient has been advised to be on Protonix p.o. daily and to be      started on ferrous sulfate p.o. t.i.d. with meals.  3. History of obstructive sleep apnea:  Continue CPAP at night.  As      per the cardiac catheterization, the pulmonary artery pressure was      31 systolic.  The systolic was at 31 and diastolic was 21.  So in      view of the high wedge pressure, the pulmonary artery systolic      pressure of 31 is acceptable and does not put obstructive sleep      apnea as a big factor in causing pulmonary hypertension here, so      with correction of the mitral regurgitation, I expect the pulmonary      artery pressures to become lower.  4. Mild anxiety-related issues:  Patient had been started on Celexa 20      mg p.o. daily.  5. Rheumatoid arthritis:  Currently stable.  Continue prednisone 10 mg      p.o. daily.   DISPOSITION:  Pending further surgical evaluation by cardiothoracic  surgery.   A final list of medications and further changes will be dictated at the  time of discharge by the discharging physician.      Charlestine Massed, MD  Electronically  Signed     UT/MEDQ  D:  11/04/2008  T:  11/04/2008  Job:  [id number]

## 2010-09-28 NOTE — Discharge Summary (Signed)
NAMEJOSELINE, [name]           ACCOUNT NO.:  [id number]   MEDICAL RECORD NO.:  [id number]          PATIENT TYPE:  INP   LOCATION:  2017                         FACILITY:  [facility]   PHYSICIAN:  Salvatore Decent. Cornelius Moras, M.D. DATE OF BIRTH:  1960-10-18   DATE OF ADMISSION:  12/18/2008  DATE OF DISCHARGE:                               DISCHARGE SUMMARY   FINAL DIAGNOSES:  1. Severe mitral regurgitation.  2. 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  3. Patent foramen ovale.   IN-HOSPITAL DIAGNOSES:  1. Acute blood loss anemia postoperatively.  2. Volume overload postoperatively.   SECONDARY DIAGNOSES:  1. Congestive heart failure.  2. Hypertension.  3. Iron-deficient anemia history.  4. Gastric ulcers.  5. Obstructive sleep apnea.  6. Morbid obesity.  7. Rheumatoid Arthritis.  8. Carpal tunnel syndrome.  9. Status post right knee surgery.   IN-HOSPITAL OPERATIONS AND PROCEDURES:  1. Intraoperative transesophageal echocardiogram.  2. Right miniature thoracotomy for mitral valve repair using a 28-mm      Sorin MEMO 3-D ring annuloplasty, tricuspid valve repair with a 28-      mm Edwards MC3 ring annuloplasty, and closure of patent foramen      ovale.   HISTORY AND PHYSICAL AND HOSPITAL COURSE:  The patient is a 50 year old  obese African American female from [place] with no previous cardiac  history who was admitted on October 30, 2008 to the hospital with class IV  congestive heart failure and pulmonary edema.  The patient's past  medical history is notable for hypertension, rheumatoid arthritis,  gastroesophageal reflux disease, and gout.  The patient had been  evaluated in the emergency department on numerous occasions for severe  shortness of breath and cough and has been attributed to bronchitis.  Following hospitalization on November 02, 2008, the patient noted to be in  pulmonary edema and severe bilateral lower extremity edema.  The patient  was treated with clear liquid and  diuretic therapy.  Echocardiogram done  revealed severe mitral regurgitation and the patient was seen  consultation by Dr. [name].  The patient underwent TEE on November 03, 2008, confirming the presence of severe mitral regurgitation.  There  is at least moderate left ventricular dysfunction with ejection fraction  estimated at 40%, mild to moderate left ventricular chamber enlargement.  There is mild to moderate tricuspid regurgitation, and small patent  foramen ovale.  Left and right heart catheterization done demonstrated  normal coronary anatomy with no significant coronary artery disease.  There is moderate left ventricular dysfunction.  There is pulmonary  hypertension.  Following studies, Dr. Cornelius Moras was consulted.  Dr. Cornelius Moras saw  and evaluated the patient.  He discussed with the patient undergoing  miniature mitral valve repair as well as closure of the PFO and possible  tricuspid valve repair.  He discussed the risks and benefits with the  patient.  The patient acknowledged understanding and agreed to proceed.  Surgery was scheduled for December 19, 2008.   For further details of the patient's past medical history and physical  exam, please see dictated H and P.   The patient was taken to  the operating room on December 18, 2008, where she  underwent right miniature thoracotomy for mitral valve repair using a 28-  mm Sorin MEMO 3-D ring annuloplasty, tricuspid valve repair with 28-mm  Edwards MC3 ring annuloplasty, and closure of patent foramen ovale.  The  patient tolerated this procedure well and transferred to the Intensive  Care Unit in stable condition.  Postoperatively, the patient was noted  to be hemodynamically stable.  She was extubated evening of surgery.  Post extubation, the patient noted to be alert and orient x4, neuro  intact.  Postoperatively, the patient's blood pressure was stable and  she was in normal sinus rhythm.  All drips were able to be weaned and   discontinued.  The patient was started back on low-dose beta-blocker.  Blood pressure tolerated well and she remained in normal sinus rhythm.  Currently, holding off and restarting ACE inhibitor secondary to blood  pressure being controlled by a low-dose beta-blocker at this time.  We  will reevaluate as an outpatient.  Postoperatively, chest x-rays  obtained noted to be stable.  The patient's chest tube drainage was  followed closely.  They decreased over the next several days and chest  tube and JP drain were able to be discontinued in normal fashion.  Followup chest x-ray showed small apical right pneumothorax which  remained stable on chest x-ray prior to discharge to home.  Postoperatively, the patient did develop acute blood loss anemia.  Hemoglobin/hematocrit dropped to 8.6 and 27%.  She did receive 1 unit of  packed red blood cell transfusion postoperatively.  Her  hemoglobin/hematocrit continued to be followed.  It did continue to  trend down over the next several days.  The patient was started on p.o.  iron.  Currently, the patient is asymptomatic and monitoring her H&H  closely.  Most recent hemoglobin/hematocrit 7.6 and 23.8, we will  recheck prior to discharge to home.  The patient did have some  significant volume overload postoperatively.  She was started on  diuretics.  Daily weights obtained.  This was improving slowly prior to  discharge home.  Currently, the patient is 6.2 kg above her preoperative  weight.  Postoperatively, the patient was working with cardiac rehab.  She was up ambulating well with assistance.  She was tolerating diet  well.  No nausea or vomiting noted.  All incisions were noted to be  clean, dry, and intact and healing well.   On postop day #4, the patient was noted to be afebrile.  She is in  normal sinus rhythm.  Blood pressure stable.  She is saturating greater  than 90% on room air.  Most recent lab work shows white blood cell count  8.5,  hemoglobin 7.6, hematocrit 23.8, and platelet count 230.  Sodium of  136, potassium 4.2, chloride of 97, bicarbonate 32, BUN of 12,  creatinine 0.86, and glucose 134.  The patient is tentatively ready for  discharge home in the a.m. pending she remain stable.  Preoperatively,  the patient had a hemoglobin A1c done at 6.4.  She has no history  diabetes mellitus.  Her blood sugars were followed closely  postoperatively.  She was placed on sliding scale insulin and blood  sugars were noted to be stable.  Plan for the patient to follow up with  her primary care physician regarding further management of possible  borderline diabetes.  She is to be on a diabetic diet until seen by her  primary physician.   FOLLOWUP APPOINTMENTS:  A followup appointment has been arranged with  Dr. Cornelius Moras for December 29, 2008, at 3:45 p.m.  The patient will need to  obtain PA and lateral chest x-ray 30 minutes prior to this appointment.  The patient is to follow up with Dr. Shirlee Latch in 2 weeks.  She will need  to contact his office to schedule this appointment. The patient is to  follow up with her primary physician in the next 2-4 weeks.  This is for  possible evaluation of diabetes.  She is to contact our office to  schedule this appointment.   ACTIVITY:  The patient is instructed no driving until released to do so,  no heavy lifting over 10 pounds.  She is told to ambulate 3-4 times per  day, progress as tolerated and to continue using her breathing  exercises.   DIET:  The patient is educated on diet to be low-fat, low-salt well as  diabetic diet.   INCISIONAL CARE:  The patient is told to shower washing her incisions  using soap and water.  She is contact the office if she develops any  drainage or opening from any of her incision sites.   DISCHARGE MEDICATIONS:  Please see medication discharge instructions in  e-chart for updated discharge medications.      Sol Blazing, PA      Salvatore Decent.  Cornelius Moras, M.D.  Electronically Signed    KMD/MEDQ  D:  12/22/2008  T:  12/23/2008  Job:  [id number]   cc:   [name], MD  Maurice March, M.D.

## 2010-09-28 NOTE — Op Note (Signed)
[name], [name]           ACCOUNT NO.:  [id number]   MEDICAL RECORD NO.:  [id number]          PATIENT TYPE:  INP   LOCATION:  2313                         FACILITY:  [facility]   PHYSICIAN:  Guadalupe Maple, M.D.  DATE OF BIRTH:  [birth year]   DATE OF PROCEDURE:  DATE OF DISCHARGE:                               OPERATIVE REPORT   PROCEDURE:  Intraoperative transesophageal echocardiography.   [name] is a 50 year old African American female with a  history of congestive heart failure and severe mitral regurgitation and  left ventricular dysfunction.  She is scheduled to undergo mitral valve  repair by Dr. Tressie Stalker.  Intraoperative transesophageal  echocardiography was requested to evaluate the mitral valve and to  assist with valvular repair and to serve as a monitor for intraoperative  volume status and to assess for any other valvular pathology.   The patient was brought to the operating room of  [facility]  and general anesthesia was induced without difficulty.  The trachea was  intubated without difficulty.  The transesophageal echocardiography  probe was then inserted into the esophagus following orogastric  suctioning.   PREBYPASS FINDINGS:  1. Aortic valve.  The aortic valve appeared normal.  It was      trileaflet.  The leaflets were thin and pliable and opened      normally.  There was no aortic insufficiency.  There was no      calcification of the leaflets noted.  2. Mitral valve.  There was moderate mitral regurgitation noted.      There appeared to be a dilated mitral annulus with a central jet of      mitral regurgitation.  This appear to be originated diffusely along      the coaptation line.  There were no torn chordae or prolapsing      segments appreciated.  There were no vegetations to the valve or      perforation to the valve noted.  3. Left ventricle.  There was moderate-to-severe left ventricular      dysfunction.  Left  ventricular cavity was dilated, and there was      global hypokinesis noted.  There were no thin segments or akinetic      segments appreciated.  The left ventricular end diastolic diameter      measured 5.8 cm.  Left ventricular wall thickness measured 1.1-1.2      cm at end diastole at the mid papillary level of the anterior      posterior walls.  Ejection fraction was estimated at 30%.  There      was no thrombus noted in the left ventricular apex.  4. Right ventricle.  The right ventricular cavity appeared to have      mild-to-moderate enlargement.  There was good contractility of the      right ventricular free wall and there appeared to be right      ventricular hypertrophy that was moderate.  The tricuspid annulus      was dilated.  5. Tricuspid valve.  There was severe tricuspid insufficiency.  The  leaflets appeared structurally intact, but the tricuspid annulus      was dilated.  It measured 4.2 cm in the four-chamber view and 5.2      cm in diameter in the RV inflow outflow view.  The jet of tricuspid      insufficiency extended into the right atrium to the inner atrial      septum.  6. Interatrial septum.  There was a patent foramen ovale noted in the      superior aspect of the fossa ovalis.  This appeared to be producing      a left-to-right shunt.  7. Left atrium.  There was no thrombus noted in the left atrium and      left atrial appendage.  8. Descending aorta.  The ascending aorta showed a well-defined aortic      root inside the tubular ridge.  The aorta was not dilated and there      was no atheromatous disease appreciated.  9. Descending aorta.  The descending aorta appeared free of      atheromatous disease and measured 1.8 cm in diameter.   POSTBYPASS FINDINGS:  1. Aortic valve.  The aortic valve was unchanged from the prebypass      study.  There was normal opening of the valve and no aortic      insufficiency.  2. Mitral valve.  There was an annuloplasty  ring in the mitral      position.  The anterior leaflet appeared to open freely and there      was restriction of motion of the posterior leaflet.  There was no      residual mitral insufficiency noted.  The mitral valve area by      pressure halftime measured 2.85 cm and mean gradient measured 6      mmHg with a cardiac output of 6.  3. Left ventricle.  The left ventricular cavity again appeared      dilated.  There was again global hypokinesis of the left      ventricular wall motion and ejection fraction was again estimated      at 30%.  4. Right ventricle.  The right ventricular cavity again appeared      somewhat dilated, but there was contractility of the right      ventricular free wall and no flattening of the septum.  5. Tricuspid valve.  There was an annuloplasty ring in the tricuspid      position and there was residual 1+ tricuspid insufficiency.  6. Interatrial septum.  There was no evidence of residual patent      foramen ovale noted in the interatrial septum by color Doppler.           ______________________________  Guadalupe Maple, M.D.     DCJ/MEDQ  D:  12/18/2008  T:  12/19/2008  Job:  [id number]

## 2010-09-28 NOTE — H&P (Signed)
[name], [name]           ACCOUNT NO.:  [id number]   MEDICAL RECORD NO.:  [id number]          PATIENT TYPE:  EMS   LOCATION:  MAJO                         FACILITY:  [facility]   PHYSICIAN:  Vania Rea, M.D. DATE OF BIRTH:  1960-08-11   DATE OF ADMISSION:  11/07/2008  DATE OF DISCHARGE:                              HISTORY & PHYSICAL   PRIMARY CARE PHYSICIAN:  [facility].   CARDIOLOGIST:  [name], MD, [facility].   CHIEF COMPLAINT:  Abdominal pain and vomiting since this afternoon.   HISTORY OF PRESENT ILLNESS:  This is an obese African American lady who  was discharged from this facility 2 days ago, after a 6-day hospital  stay during which she was investigated for new onset congestive heart  failure.  During the course of her investigations, the patient had a CT  angiogram of the chest on October 31, 2008 and subsequently had underwent  cardiac catheterization on June 22.  Because of her heart failure, which  was eventually diagnosed as dilated cardiomyopathy with severe mitral  regurgitation, the patient was started on Lasix and lisinopril.  The  patient's serum creatinine on the day of discharge was 0.8.   The patient was also diagnosed with gastritis and iron deficiency anemia  related gastritis, and was started on Protonix.   The patient notes that she usually passes urine very frequently, but  today up to 9 p.m. has only passed urine once.   The patient was evaluated in the emergency room and found to have a  serum creatinine greater than 4, and an estimated creatinine clearance  of only 10.  The Hospitalist Service was called to assist in management.   The patient denies fever, cough or cold.  She denies chest pain or  shortness of breath.  She is complaining of cramping in the toes since  last night.  She does not report any disturbance of her appetite.  She  denies taking any over-the-counter medications.   PAST MEDICAL HISTORY:  1. Congestive  heart failure, secondary to severe mitral regurgitation.  2. Severe mitral regurgitation.  3. Microcytic anemia, secondary to chronic gastric blood loss.  4. Gastric ulcers.  5. Morbid obesity.  6. Obstructive sleep apnea, on CPAP.  7. GERD.  8. Hypertension, controlled.  9. Rheumatoid arthritis, controlled on prednisone.  10.History of gout.  11.Depression.   MEDICATIONS:  1. Vicodin 5/325 one tablet every 4 hours when necessary.  2. Protonix 40 mg daily.  3. Prednisone 10 mg daily.  4. Potassium chloride 20 mEq daily.  5. Lasix 40 mg daily.  6. Flexeril 10 mg twice daily when necessary.  7. Coreg 6.25 mg twice daily.  8. Aspirin 81 mg daily.  9. Lisinopril 40 mg daily.  10.Celexa 20 mg daily.   ALLERGIES:  COLCHICINE.   SOCIAL HISTORY:  She has been smoking about 6 cigarettes per day for  many years, but has not smoked at all for the past week.  No history of  tobacco, alcohol or illicit drug use.   FAMILY HISTORY:  Negative for cardiac or renal disease.   REVIEW OF SYSTEMS:  On a 10-point review of systems, other than noted  above was negative.   PHYSICAL EXAMINATION:  A pleasant middle-aged African American lady,  sitting up in the stretcher.  Clearly distressed by pain, despite having  received intravenous Dilaudid.  She is a little drowsy from the  Dilaudid.  VITALS:  Temperature 97, pulse 97, respirations 20, blood pressure  108/65.  She is saturating at 99% on room air.  She describes her pain  as 8/10.  Her pupils are round, equal and reactive.  Mucous membranes are pink.  She is mildly dehydrated.  No cervical lymphadenopathy or thyromegaly.  She does have a thick neck.  No jugular venous distention appreciated.  CHEST:  Clear to auscultation bilaterally.  CARDIOVASCULAR:  Regular rhythm; no murmur appreciated.  ABDOMEN:  She does have epigastric tenderness.  Her abdomen is otherwise  obese, soft, with no masses.  EXTREMITIES:  Without edema.  She has 1+  dorsalis pedis pulses  bilaterally.  The feet are warm.  SKIN:  Warm and dry.  There are no ulcerations nor rash.  CENTRAL NERVOUS SYSTEM:  Cranial nerves II-XII are grossly intact.  She  has no focal neurologic deficits; specifically, she has no carpopedal  spasms.   LABS:  White count 7.6, hemoglobin 11.9, MCV 68.6, platelets 240.  She  had a normal differential.  Serum lipase is normal at 23.  Complete  Metabolic Panel:  Sodium 140, potassium 4.2, chloride 105, CO2 23,  glucose 95, BUN 32, creatinine 4.81.  Her estimated GFR is 12. Liver  functions are completely normal.  Total protein 8.3, albumin 4.0,  calcium 10.2.   As noted, on the day of discharge her BUN was 9 and her creatinine was  0.89.  Her hemoglobin on the day of discharge was 10.8.  No imaging  studies have yet been done.   ASSESSMENT:  1. Acute renal failure, in the setting of an obese lady with dilated      cardiomyopathy and congestive heart failure; recently started on      lisinopril, and recently subjected to CT angiogram with IV      contrast; and cardiac catheterization with IV contrast.  2. Microcytic anemia.  3. History of gastritis.  4. History of rheumatoid arthritis.  5. Congestive heart failure, compensated.  6. Hypertension, controlled.  7. Obstructive sleep apnea.  8. Other medical problems as noted above.   PLAN:  Will admit this lady for hydration with normal saline.  Will  withhold bicarb at this time, since it does not appear to be any  evidence of acidosis.  Will check serum total CPK and will go ahead and  order renal ultrasound.  Will discontinue lisinopril at this time.  Will  consult the nephrologists for assistance with management.  Other plans  as per orders.      Vania Rea, M.D.  Electronically Signed     LC/MEDQ  D:  11/07/2008  T:  11/07/2008  Job:  [id number]   cc:   [name], MD  [facility]  Dione Booze, MD

## 2010-09-28 NOTE — Assessment & Plan Note (Signed)
OFFICE VISIT   [name], [name]  DOB:  24-Jan-1961                                        December 29, 2008  CHART #:  [id number]   The patient returns to the office today for followup, status post right  miniature thoracotomy for mitral valve repair, tricuspid valve repair,  and closure of patent foramen ovale on December 18, 2008.  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  She had  severe (4+) mitral regurgitation and at least moderate (3+) tricuspid  regurgitation.  Postoperatively, she has done remarkably well.  She has  remained in sinus rhythm, and she was felt to be a relatively poor  candidate for anticoagulation with Coumadin.  As a result, she was  discharged home on aspirin only.  Following hospital discharge last  week, she has continued to do remarkably well.  She states that at this  point she really has minimal residual soreness in her chest.  She has  more complaints related to her knees which predated her surgery.  She  states that her breathing is fine and basically similar to how it was  prior to surgery.  She is sleeping on a recliner as it helps to stay  comfortable.  Her appetite is not back to normal, but she laughs and  states that she thinks this is probably good and she hopes that she will  lose some weight.  She states that she is eating and drinking enough.  She has not had fevers or chills.  She has not had a cough.  She has not  had any tachypalpitations or dizzy spells.  The remainder of her review  of systems unremarkable.  Medications remain unchanged from the time of  hospital discharge.   PHYSICAL EXAMINATION:  GENERAL:  A well-appearing obese female.  VITAL SIGNS:  Blood pressure 110/68, pulse 80 and regular, and oxygen  saturation 95% on room air.  CHEST:  A miniature anterolateral thoracotomy incision that is  healing  nicely.  The two small chest tube incisions in the right inframammary  crease are clean and healing appropriately.  They are still somewhat  open but they are not draining.  There is no sign of infection.  CARDIOVASCULAR:  Regular rate and rhythm.  No murmurs, rubs, or gallops  are noted.  ABDOMEN:  Soft and nontender.  The right groin incision is healing  nicely.  EXTREMITIES:  There is mild bilateral lower extremity edema.  No other  abnormalities are noted.   DIAGNOSTIC TEST:  Chest x-ray performed today at the [facility] is reviewed.  This demonstrates trivial right pleural effusion  with stable mild opacity in the right lung field that has not changed  following hospital discharge.  There is good aeration in both lungs.  There is no sign of pulmonary edema.  No other abnormalities are noted.   IMPRESSION:  Satisfactory progress following recent mitral valve repair,  tricuspid valve repair, and closure of patent foramen ovale.   PLAN:  I have encouraged the patient to continue to gradually increase  her physical activity as tolerated.  I have encouraged her to start  walking more.  We have not made any changes in her medications.  She is  scheduled to see Dr. Shirlee Latch for followup within next 2 weeks.  We will  plan to see her back in 2 weeks with a repeat chest x-ray.  All of her  questions have been addressed.   Salvatore Decent. Cornelius Moras, M.D.  Electronically Signed   CHO/MEDQ  D:  12/29/2008  T:  12/30/2008  Job:  [id number]   cc:   [name], MD  Maurice March, M.D.

## 2010-09-28 NOTE — Discharge Summary (Signed)
[name], GEHLHAUSEN           ACCOUNT NO.:  [id number]   MEDICAL RECORD NO.:  [id number]          PATIENT TYPE:  INP   LOCATION:  4731                         FACILITY:  [facility]   PHYSICIAN:  Altha Harm, MDDATE OF BIRTH:  1961-02-23   DATE OF ADMISSION:  10/30/2008  DATE OF DISCHARGE:  11/05/2008                               DISCHARGE SUMMARY   ADDENDUM   FINAL DISCHARGE DIAGNOSES:  Please refer to the discharge summary from  yesterday on June 22 for details of discharge diagnosis.   DISCHARGE MEDICATIONS:  1. Vicodin 5/325 one tablet p.o. q.4h. p.r.n..  2. Protonix 40 mg p.o. daily.  3. Prednisone 10 mg p.o. daily.  4. Klor-Con 20 mEq p.o. daily.  5. Lasix 50 mg p.o. daily.  6. Flexeril 10 mg p.o. b.i.d. p.r.n..  7. Coreg 6.25 mg p.o. b.i.d..  8. Aspirin 81 mg p.o. daily.  9. Lisinopril 40 mg p.o. daily.  10.Celexa 20 mg p.o. daily.   Please refer to the discharge summary dictated yesterday for hospital  course.   HOSPITAL COURSE BETWEEN YESTERDAY AND TODAY:  The patient is essentially  medically stable.  She was seen by Cardiology who ordered a cardiac MRI  for her which was done today.  The patient continues to be stable; all  vital signs are stable. She is afebrile.  She is ambulatory without any  needs for oxygen and her heart rate is controlled.   DIETARY RESTRICTIONS:  The patient should be on a heart healthy low  sodium diet.   FOLLOWUP:  The patient is to followup with Dr. Shirlee Latch, Cardiology in 2  weeks.  She is to followup with her [facility] doctors on an as needed  basis.   PHYSICAL RESTRICTIONS:  None.   TOTAL TIME:  25 minutes      Altha Harm, MD  Electronically Signed     MAM/MEDQ  D:  11/05/2008  T:  11/05/2008  Job:  [id number]   cc:   [name], MD  [facility]

## 2010-09-28 NOTE — H&P (Signed)
HISTORY AND PHYSICAL EXAMINATION   November 24, 2008   Re:  [name], [name]             DOB:  18-Jul-1960   REASON FOR CONSULTATION:  Severe mitral regurgitation with severe  congestive heart failure.   HISTORY OF PRESENT ILLNESS:  The patient is a 50 year old obese African  American female from [place] with no previous cardiac history, who  was admitted to the hospital on October 30, 2008, with class IV congestive  heart failure.  The patient has medical history notable for history of  hypertension, rheumatoid arthritis, GE reflux disease, and gout.  The  patient has no previous cardiac history and denies any known history of  heart murmur or rheumatic fever.  She states that over the last couple  of years, she has had several visits to the emergency department for  symptoms of shortness of breath and cough which have been attributed to  bronchitis.  She had a particularly severe episode in January 2010 and  another episode in March 2010.  At each time, her symptoms were  primarily of shortness of breath.  Her symptoms were attributed to  bronchitis, obstructive sleep apnea, and obesity.  The patient developed  acute exacerbation of shortness of breath associated with severe  bilateral lower extremity edema lasting for approximately 1 week prior  to her presentation on October 30, 2008.  At that time her physical  presentation was notable for the presence of severe bilateral lower  extremity edema and chest x-ray revealed pulmonary vascular congestion  consistent with pulmonary edema.  She was admitted to the hospital and  treated with diuretic therapy.  Echocardiogram revealed severe mitral  regurgitation and a Cardiology consultation was obtained.  The patient  subsequently underwent transesophageal echocardiogram on November 03, 2008,  by Dr. Shirlee Latch.  This confirmed the presence of severe mitral  regurgitation.  Left ventricular function was depressed with ejection  fraction estimated at 40%.  There was mild left ventricular chamber  enlargement.  There was a small patent foramen ovale detected on bubble  study with Valsalva.  There was mild-to-moderate tricuspid  regurgitation.  No other significant abnormalities were noted.  Left and  right heart catheterization was performed on November 04, 2008, by Dr.  Shirlee Latch.  This confirmed the presence of mild left ventricular chamber  enlargement with moderate left ventricular dysfunction and ejection  fraction estimated at 40%.  There was at least moderate-to-severe mitral  regurgitation.  There was mild pulmonary hypertension with PA pressures  measured 31/21, pulmonary capillary wedge pressure of 21 but a prominent  V-wave was noted.  There was normal coronary artery anatomy with no  significant coronary artery disease.   The patient initially responded well to diuretic therapy and her  symptoms improved.  She was discharged from the hospital, but promptly  readmitted 2 days later with acute renal failure.  This was felt to be  related to possible contrast nephropathy and/or related to therapy for  hypertension including ACE inhibitor and diuretic.  Renal failure  resolved promptly with hydration.  The patient was ultimately discharged  again 3 days later.  Since then, she has remained clinically stable and  she presents to our office today for formal consultation and possible  elective surgical intervention.   REVIEW OF SYSTEMS:  General:  The patient reports stable weight.  She is  5 feet 7 inches tall, weight 281 pounds.  Her appetite is normal.  Cardiac:  The patient denies  any chest pain, chest tightness, chest  pressure either with activity or at rest.  She denies any  tachypalpitations or dizzy spells.  She reports exertional shortness of  breath.  She reports orthopnea which has improved on medical therapy.  She reports bilateral lower extremity edema that has improved on medical  therapy.   Respiratory:  Notable for shortness breath and intermittent  dry cough.  She denies productive cough, hemoptysis, wheezing.  Gastrointestinal:  Notable for some history of gastric ulcer and anemia  with chronic blood loss.  She did undergo upper GI endoscopy and  colonoscopy during her admission in June, and at that time there was  evidence of linear gastric ulcerations and gastritis without ongoing  bleeding.  The patient denies any history of hematochezia, hematemesis,  or melena.  She has had some small amounts of bleeding related to  hemorrhoids in the past.  Genitourinary:  Negative.  Musculoskeletal:  Notable for chronic arthritis that particularly afflicts her arms and  shoulders.  This has been attributed to rheumatoid arthritis and she has  been taking prednisone.  She also has history of gout.  Psychiatric:  Negative.  HEENT:  The patient has not seen a dentist in some time.  She  denies any loose teeth or painful teeth.   PAST MEDICAL HISTORY:  1. Mitral regurgitation.  2. Congestive heart failure.  3. Hypertension.  4. Iron-deficient anemia.  5. Gastric ulcers.  6. Obstructive sleep apnea.  7. Morbid obesity.  8. Rheumatoid arthritis.  9. Carpal tunnel syndrome.   PAST SURGICAL HISTORY:  Right knee surgery.   FAMILY HISTORY:  Noncontributory.   SOCIAL HISTORY:  The patient is single and lives alone here in  [place], although since her recent hospitalization she has been  staying with her daughter.  She has a total of five grown children, two  of which live locally in [place].  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  She has been  out of work since her hospitalization for medical reasons.  She quit  smoking at the time of her recent hospitalization.  Prior to that she  was smoking less than one pack of cigarettes per day.  She denies any  history of excessive alcohol consumption.   CURRENT MEDICATIONS:  1.  Vicodin tablets as needed for pain.  2. Protonix 40 mg daily.  3. Flexeril 10 mg twice daily.  4. Coreg 6.25 mg twice daily.  5. Aspirin 81 mg daily.  6. Celexa 20 mg daily.  7. Ventolin hand-held inhaler as needed.   DRUG ALLERGIES:  Colchicine causes hives and lip swelling.   PHYSICAL EXAMINATION:  GENERAL:  The patient is an obese [place]  American female, who appears her stated age, in no acute distress.  VITAL SIGNS:  Blood pressure 148/100, pulse 88 and regular, and oxygen  saturation 97% on room air.  HEENT:  Grossly unrevealing.  NECK:  Supple.  There is no cervical or supraclavicular lymphadenopathy.  There is no jugular venous distention.  There are no carotid bruits.  LUNGS:  Auscultation of the chest reveals a few bibasilar inspiratory  crackles.  No wheezes or rhonchi are noted.  CARDIOVASCULAR:  Regular rate and rhythm.  There is a grade 3/6 systolic  murmur heard best at the apex and axilla.  No diastolic murmurs are  noted.  ABDOMEN:  Obese, but soft and nontender.  Bowel sounds are present.  EXTREMITIES:  Warm and adequately  perfused.  Femoral pulses are strong  and easily palpable.  Distal pulses are palpable.  There is mild  bilateral lower extremity edema.  SKIN:  Clean and dry.  There are no active skin lesions or ulcerations.  RECTAL/GU:  Deferred.   DIAGNOSTIC TESTS:  Transesophageal echocardiogram performed by Dr.  Shirlee Latch on November 03, 2008, is reviewed.  This demonstrates the presence of  severe (4+) mitral regurgitation.  The jet of regurgitation is brought  in central and fills the entire left atrium.  The regurgitation appears  to be primarily functional, both the anterior and posterior leaflets  appear relatively thin with incomplete central coaptation.  There is no  sign of myxomatous mitral valve disease and specifically no sign of any  mitral valve prolapse.  There may be some restriction of this posterior  leaflet, but this appears mild.  I am  suspicious that this is all  primary functional and related to dilated nonischemic cardiomyopathy,  potentially related to longstanding hypertension.  It is possible that  valve repair will be feasible with simple ring annuloplasty.  The left  ventricle is significantly dilated as is the left atrium.  Left  ventricular function is definitely reduced with ejection fraction  estimated somewhat less than 40% despite the presence of severe mitral  regurgitation.  The aortic valve appears normal.  There is mild-to-  moderate tricuspid regurgitation.  No other significant abnormalities  are noted except for what appears to be a tiny patent foramen ovale  noted on bubble study.   Left and right heart catheterization performed on November 04, 2008, by Dr.  Shirlee Latch is reviewed.  This demonstrates normal coronary artery anatomy  with no significant coronary artery disease.  Pulmonary artery pressures  are as noted previously.  The left ventricle is dilated.  There is  global hypokinesis with significant inferior wall hypokinesis.  There is  at least moderate mitral regurgitation.   IMPRESSION:  Severe mitral regurgitation with longstanding chronic  symptoms of progressive shortness of breath, orthopnea, and bilateral  lower extremity edema, culminating in recent hospitalization with class  IV congestive heart failure.  Symptoms were further exacerbated by the  presence of chronic iron deficiency anemia with probable history of  chronic gastrointestinal blood loss.  The patient has improved on  medical therapy.  She did develop transient acute renal failure  following her heart catheterization, but this resolved quickly with  hydration and manipulation of medical therapy.   PLAN:  I have discussed matters at length with the patient and one of  her daughters here in the office today.  Alternative treatment  strategies have been discussed.  They are interested in minimally  invasive approach for mitral  valve repair.  She will need Dental Service  consultation.  We will check complete blood count today to make sure  that her hemoglobin remain stable.  We will also recheck renal function  and liver function and check a B-type natriuretic peptide level.  We  will see her back in the office on Monday, December 15, 2008, with  tentative plans for surgery on December 18, 2008.  All of her questions  have been addressed.   Salvatore Decent. Cornelius Moras, M.D.  Electronically Signed   CHO/MEDQ  D:  11/24/2008  T:  11/25/2008  Job:  [id number]   cc:   [name], MD  Maurice March, M.D.  Charlynne Pander, D.D.S.

## 2010-09-28 NOTE — Consult Note (Signed)
[name], [name]           ACCOUNT NO.:  [id number]   MEDICAL RECORD NO.:  [id number]          PATIENT TYPE:  INP   LOCATION:  4731                         FACILITY:  [facility]   PHYSICIAN:  Jordan Hawks. Elnoria Howard, MD    DATE OF BIRTH:  [birth year]   DATE OF CONSULTATION:  10/31/2008  DATE OF DISCHARGE:                                 CONSULTATION   REFERRING PHYSICIAN:  This is unassigned, hospitalist patient.   REASON FOR CONSULTATION:  Microcytic anemia.   HISTORY OF PRESENT ILLNESS:  This is a 50 year old female with a past  medical history of gastroesophageal reflux disease, gout, rheumatoid  arthritis, and hypertension who is admitted to the hospital with  complaints of worsening shortness of breath.  Apparently, the patient  states that she has been having this issue for a several months, but of  late, her symptoms are markedly increased.  She had notable complaints  of bilateral lower extremity swelling and also some dyspnea on exertion.  She was evaluated in the office; and because of a shortness of breath,  she subsequently was referred to their emergency room for further  evaluation and treatment.  The patient denies having any chest pain,  nausea, vomiting, diarrhea, constipation, melena, or hematochezia.  Upon  her evaluation, she was noted to be in heart failure as there was an  enlarged heart and also vascular congestion.  During the workup, the  patient was found to have microcytic anemia, and it was felt that she  may have a high output failure resulting in her CHF.  Cardiology was  consulted, and she is undergoing current evaluation for her symptoms.  The CT angio was performed, was negative for any evidence of pulmonary  embolus.  Her echocardiogram was performed, but the results are  nonavailable in the chart at this time.   PAST MEDICAL HISTORY AND PAST SURGICAL HISTORY:  As stated above.   FAMILY HISTORY:  Noncontributory.   SOCIAL HISTORY:  Negative for  alcohol, tobacco, or illicit drug use.   REVIEW OF SYSTEMS:  As stated above in the history of present illness,  otherwise negative.   MEDICATIONS:  1. Aspirin 325 mg p.o. daily.  2. Celexa 20 mg p.o. daily.  3. Catapres 0.1 mg p.o. b.i.d.  4. Lovenox 40 mg subcu daily.  5. Iron sulfate 325 mg p.o. t.i.d.  6. Lasix 40 mg IV b.i.d.  7. Prinivil 20 mg p.o. daily.  8. Protonix 40 mg p.o. daily.  9. Prednisone 10 mg p.o. daily.  10.Hydralazine 10 mg IV q.6 h. p.r.n.  11.Zofran 4 mg IV q.6 h. p.r.n.  12.Tylenol 650 mg p.o. q.4 h. p.r.n.   ALLERGIES:  COLCHICINE.   PHYSICAL EXAMINATION:  VITAL SIGNS:  Blood pressure is 143/98, heart  rate is 20, respirations 100, and temperature is 97.0.  GENERAL:  The patient is in no acute distress, alert, and oriented.  HEENT:  Normocephalic, atraumatic.  Extraocular muscles intact.  NECK:  Supple.  No lymphadenopathy.  LUNGS:  Clear to auscultation bilaterally.  CARDIOVASCULAR:  Regular rate and rhythm.  ABDOMEN:  Obese, soft, nontender, and nondistended.  EXTREMITIES:  No clubbing, cyanosis, or edema.  RECTAL:  Heme positive.   LABORATORY VALUES:  White blood cell count is 5.9, hemoglobin 9.3, MCV  is 67.9.  PT is 14.7, INR 1.1, and PTT is 30.  Sodium 141, potassium  3.5, chloride 103, CO2 of 33, glucose 115, BUN 9, and creatinine 0.8.   IMPRESSION:  1. Microcytic anemia.  2. Heme-positive stools.  3. New-onset congestive heart failure.   The patient does require further evaluation of the EGD and colonoscopy.  However at this time, she is undergoing an evaluation for her CHF  symptoms.  I am unable to discern from the chart if the patient will  definitely have a cardiac catheterization at this time.  Again, the  echocardiogram results are not available to me.  She appears to be  clinically stable and can most likely tolerated the EGD the procedure.  However, I will await final clearance from cardiology before pursuing  the EGD and  colonoscopy.   PLAN:  At this time is perform EGD and colonoscopy when cleared.      Jordan Hawks Elnoria Howard, MD  Electronically Signed     PDH/MEDQ  D:  10/31/2008  T:  11/01/2008  Job:  [id number]

## 2010-09-28 NOTE — Assessment & Plan Note (Signed)
OFFICE VISIT   [name], [name]  DOB:  06/13/1960                                        May 11, 2009  CHART #:  [id number]   The patient returns for followup status post right miniature thoracotomy  for mitral valve repair and tricuspid valve repair with closure of  patent foramen ovale on December 18, 2008.  She was last seen here in the  office March 02, 2009.  Since then she has done better on improved  medical therapy for long-term chronic diastolic congestive heart  failure.  Her most recent echocardiogram was performed in October and  notable for the presence of moderate nonischemic cardiomyopathy with  ejection fraction estimated 25-30%.  There was trivial residual mitral  regurgitation following her previous mitral valve repair, and mitral  valve area was estimated 2.47 cm2 by pressure half time.  The tricuspid  valve repair also look good with only mild residual regurgitation.  The  patient ultimately underwent right heart catheterization by Dr. Shirlee Latch  several weeks ago to further sort out her symptoms of chronic diastolic  congestive heart failure.  She was found to have moderate pulmonary  hypertension with PA pressures measured 46/26 with a mean PA pressure of  35 and pulmonary capillary wedge pressure of 28 mmHg.  Her cardiac  output was 6.1 L per minute corresponding to cardiac index of 2.5.  Mixed venous oxygen saturation was 63% and her central venous pressure  was 19.  Since then with further adjustment in her medical therapy, the  patient has done better.  Her weight seems to be stable and she has  stable mild exertional shortness of breath.  The remainder of her past  medical history is unchanged.  The remainder of her review of systems is  unremarkable.   PHYSICAL EXAMINATION:  GENERAL:  Notable for well-appearing morbidly  obese female with blood pressure 100/66, pulse 93, oxygen saturation  98%.  CHEST:  Clear breath  sounds with slightly diminished breath sounds at  both lung bases.  No wheezes, rales, or rhonchi noted.  CARDIOVASCULAR:  Demonstrates regular rate and rhythm.  No murmurs,  rubs, or gallops are noted.  ABDOMEN:  Obese but soft, nontender.  Bowel sounds are present.  EXTREMITIES:  Warm and well perfused.  There is mild bilateral lower  extremity edema.  The remainder of her physical exam is unremarkable.   IMPRESSION:  Overall, the patient appears to be stable and doing  somewhat better on improved long-term medical therapy for chronic  hypertension and chronic diastolic congestive heart failure.  Her most  recent echocardiogram confirms intact valve repair for both mitral valve  and the tricuspid valve with trivial residual regurgitation.  She has  chronic nonischemic cardiomyopathy with ejection fraction estimated 25-  30%.   PLAN:  In the future, the patient will call or return to see [place] as  needed.  She will continue to follow along with Dr. Shirlee Latch for long-term  attention to her medical therapy.  All of her questions have been  addressed.    Salvatore Decent. Cornelius Moras, M.D.  Electronically Signed   CHO/MEDQ  D:  05/11/2009  T:  05/12/2009  Job:  [id number]   cc:   [name], MD  Maurice March, M.D.

## 2010-09-28 NOTE — Discharge Summary (Signed)
[name], [name]           ACCOUNT NO.:  [id number]   MEDICAL RECORD NO.:  [id number]          PATIENT TYPE:  INP   LOCATION:  4704                         FACILITY:  [facility]   PHYSICIAN:  Michelene Gardener, MD    DATE OF BIRTH:  1961/01/30   DATE OF ADMISSION:  11/07/2008  DATE OF DISCHARGE:  11/10/2008                               DISCHARGE SUMMARY   DISCHARGE DIAGNOSES:  1. Acute renal failure that might be related to contrast nephropathy      plus or minus effect of medicine.  2. Anemia with stable hemoglobin.  3. Congestive heart failure, ejection fraction around 40%, and the      patient has been compensated during this hospitalization.  4. Hypertension with stable blood pressure.  5. Obstructive sleep apnea.  6. Leukocytosis, most likely secondary to steroids.  7. History of gastric ulcer.  8. Morbid obesity.  9. Rheumatoid arthritis, and the patient is on prednisone.  10.History of gout.  11.Depression.  12.Gastroesophageal reflux disease.  13.History of severe mitral regurgitation.   DISCHARGE MEDICATIONS:  1. Vicodin 5/325 mg p.o. q.4 h. as needed and no prescription was      given.  2. Protonix 40 mg once a day.  3. Flexeril 10 mg twice daily as needed.  4. Coreg 6.25 mg twice a day.  5. Aspirin 81 mg once a day.  6. Celexa 20 mg once a day.   MEDICATIONS TO STOP:  1. Lasix.  2. Lisinopril.  3. Potassium.   CONSULTATION:  Nephrology consult.   PROCEDURES:  None.   DIAGNOSTIC STUDIES:  1. Renal ultrasound on 2008-11-19, showed no acute problems.  2. Chest x-ray on November 09, 2008, showed no acute problem.  Follow up      with primary doctor within a week.   COURSE OF HOSPITALIZATION:  1. Acute renal failure:  This is most likely secondary to recent      contrast nephropathy in addition to the effect of lisinopril with      possible deterioration while on Lasix.  This patient presented with      a creatinine of around 4.45.  The patient was  admitted to the      hospital.  She was given gentle hydration.  Her lisinopril and      Lasix were stopped.  Renal ultrasound was done and came to be      normal.  Nephrology consultation was done, and they recommended to      discontinue the lisinopril and Lasix as we have already did.  The      patient improved very quickly, and actually her kidney function      came totally back to normal at the time of discharge with      BUN/creatinine at 11/1.0.  The patient will be discharged today in      stable condition.  I advised her to stop her Lasix and her      lisinopril until reevaluated by her physician.  2. Anemia:  Hemoglobin has been stable.  3. Congestive heart failure:  The patient has ejection fraction of  40%, but she remained very compensated, and as a matter of fact in      the beginning, she received slow IV hydration and that was stopped      later on.   Otherwise, other medical conditions remained stable at this point.  The  patient will be discharged today on all preadmission medications except  lisinopril, Lasix, and potassium.  She will follow with her primary  doctor within a week.  She was given antibiotics for 5 days for symptoms  consistent with urinary tract infection although no urinalysis was done  during this hospitalization.   Total assessment time is 40 minutes.      Michelene Gardener, MD  Electronically Signed     NAE/MEDQ  D:  11/10/2008  T:  11/10/2008  Job:  [id number]

## 2010-09-28 NOTE — Cardiovascular Report (Signed)
[name], [name]           ACCOUNT NO.:  [id number]   MEDICAL RECORD NO.:  [id number]          PATIENT TYPE:  INP   LOCATION:  4731                         FACILITY:  [facility]   PHYSICIAN:  [name], MD      DATE OF BIRTH:  [birth year]   DATE OF PROCEDURE:  DATE OF DISCHARGE:                            CARDIAC CATHETERIZATION   PROCEDURES:  1. Left heart catheterization.  2. Right heart catheterization.  3. Coronary angiography.  4. Left ventriculography.   INDICATION:  Congestive heart failure and the patient was found to have  severe mitral regurgitation on TEE and transthoracic echocardiogram.   PROCEDURE NOTE:  After informed consent was obtained, the right groin  was sterilely prepped and draped.  A 1% lidocaine was used to locally  anesthetize the right groin area.  A 7-French venous sheath was placed  using Seldinger technique in the right common femoral vein.  A 6-French  arterial sheath was placed using Seldinger technique in the right common  femoral artery.  The right heart catheterization was carried out using a  balloon-tip Swan-Ganz catheter.  Samples were removed for oxygen  saturation from the pulmonary artery and the femoral artery.  The left  heart catheterization was carried out using the 6-French multipurpose  catheter.  The left ventricle was entered using the 6-French angled  pigtail catheter and left ventriculography was performed.   FINDINGS:  1. Hemodynamics:  Mean right atrial pressure 5 mmHg.  RV 34/15.  PA      31/21 with mean pulmonary artery pressure of 25 mmHg.  Mean      pulmonary capillary wedge pressure of 21 mmHg with prominent V-      wave.  LV 112/15, aorta 113/75,  cardiac output 6.76 liters per      minute, cardiac index 2.94.  2. Left ventriculography.  The left ventricle was mildly dilated.  EF      was estimated at 40%.  There was severe hypokinesis of the inferior      wall.  There did appear to be about 3+ mitral  regurgitation.  The      left atrium was moderately dilated.  3. Right coronary artery.  The right coronary was dominant.  There was      no significant disease.  4. Left main.  The left main had no significant disease.  5. Circumflex.  The left circumflex was a small vessel.  There was no      significant disease.  6. Ramus. There was a large ramus intermedius with no significant      disease.  7. LAD system.  There was a large LAD with multiple small diagonals      with no significant disease.   ASSESSMENT AND PLAN:  This is a 50 year old with severe mitral  regurgitation by TEE.  After diuresis, right heart catheterization shows  mild-to-moderately elevated pulmonary capillary wedge pressure with a  mean pressure of 21 mmHg.  There are prominent V-waves.  There is no  angiographic coronary disease.  There is a dilated left atrium and a  mildly dilated left ventricle with 3+ mitral  regurgitation signifying  moderate-to-severe mitral  regurgitation.  Mitral regurgitation may be the cause of her  cardiomyopathy.  We will obtain a cardiac surgery consult.  We will also  do a cardiac MRI to assess the question of sarcoidosis in the left  ventricular myocardium and finally, we will continue her on p.o. Lasix,  Coreg, and lisinopril.      [name], MD  Electronically Signed     DM/MEDQ  D:  11/04/2008  T:  11/04/2008  Job:  [id number]

## 2010-09-28 NOTE — H&P (Signed)
NAMEELYSHA, DAW           ACCOUNT NO.:  [id number]   MEDICAL RECORD NO.:  [id number]          PATIENT TYPE:  INP   LOCATION:  4731                         FACILITY:  [facility]   PHYSICIAN:  Jude Ojie, MD          DATE OF BIRTH:  [birth year]   DATE OF ADMISSION:  10/30/2008  DATE OF DISCHARGE:                              HISTORY & PHYSICAL   CHIEF COMPLAINT:  1. Progressively worsening shortness of breath.  2. Bilateral lower extremity swellings.   HISTORY OF PRESENTING COMPLAINT:  The patient is a 50 year old with  history of hypertension, gastroesophageal reflux disease, gout, and  rheumatoid arthritis, currently on prednisone who presented initially to  her primary care physician at [facility] with progressively worsening  shortness of breath and bilateral lower extremity swelling for the last  few days.  She was evaluated there and subsequently transferred to the  ED via ambulance on account of a severe shortness of breath.  The  patient claimed that she get more short of breath on very mild exertion  even just walking to the bathroom.  In the last couple of days, she has  been having significant paroxysmal nocturnal dyspnea, also positive  history of orthopnea.  Uses a minimal of 2 pillows to sleep at night and  even last night was unable to lay flat at night.  The patient denied any  associated chest pain, fevers.  Does have occasional cough, productive  of whitish to yellowish sputum periodically.  The patient was evaluated  in the ED and initial workup done showed a BNP that was mildly elevated  at 260.  Chest x-ray showed cardiac enlargement with vascular  congestion.  The patient has subsequently been admitted to triad  hospitalist team on account of new-onset CHF for further evaluation and  management.  I have discussed with the cardiologist on call, Dr.  Laural Benes, who will come to evaluate the patient.   PAST MEDICAL HISTORY:  1. Hypertension.  2. Acid reflux.  3. Gout.  4. Rheumatoid arthritis, currently on prednisone.  5. History of bronchitis.   ALLERGIES:  The patient is allergic to COLCHICINE.   CURRENT OUTPATIENT MEDICATIONS:  1. Prednisone 10 mg p.o. daily.  2. Flexeril 10 mg p.o. b.i.d.  3. Potassium chloride 20 mEq p.o. daily.  4. Lasix 40 mg p.o. daily.  5. Clonidine 0.1 mg p.o. b.i.d.  6. Protonix 40 mg p.o. daily.   FAMILY HISTORY:  The patient has 5 children.  Family history, otherwise,  unremarkable.   SOCIAL HISTORY:  The patient smokes about 6 sticks of cigarettes daily,  started smoking at age of 21 years and currently trying to quit smoking.  No history of alcohol use.  No history of IV drug use.   REVIEW OF SYSTEMS:  GENERAL:  No fevers, weight loss, or loss of  appetite.  RESPIRATORY:  Positive for shortness of breath, occasional cough,  productive of sputum.  CVS:  No chest pain or palpitations.  GI:  No nausea, vomiting, diarrhea, or constipation.  ENDOCRINE:  No polyuria, polydipsia, or polyphagia.  GENITOURINARY:  No dysuria, frequency,  or urgency.  HEME:  No easy skin bruising or epistaxis.  PSYCH:  No depression.  MUSCULOSKELETAL:  No joint pains or muscle ache.  NEUROLOGIC:  No headache, seizures, or syncope.   PHYSICAL EXAMINATION:  VITAL SIGNS:  On presentation, blood pressure of  139/87, pulse rate of 107, respiratory rate of 22, temperature 97.4, and  O2 sat of 100%.  GENERAL:  Middle-aged [place] American lady, currently in mild-to-  moderate respiratory distress, which is improved with oxygen.  CHEST:  Bibasilar rhonchi on both lung bases.  CVS:  Heart sounds 1 and 2, regular rate and rhythm.  No murmurs, rubs,  or gallops.  ABDOMEN:  Full, nontender.  No organomegaly.  Bowel sounds positive in  all quadrants.  EXTREMITIES:  No cyanosis or clubbing, but has 2+ pitting edema  bilaterally.  Pedal pulses palpable bilaterally.  NEUROLOGIC:  The patient is awake, alert, and oriented to time, place,   and person.  No focal findings at this time.   LABORATORY FINDINGS:  On presentation, iSTAT with hemoglobin of 11.9,  hematocrit 35.0.  BMP with sodium of 142, potassium 3.6, chloride 102,  bicarb 30, BUN 10, creatinine is 0.9, glucose of 102, ionized calcium of  1.17.  CK-MB of 2.1, troponin less than 0.05, myoglobin of 72.  BNP of  260.  Chest x-ray showed cardiac enlargement with vascular congestion.  EKG, I cannot find 1 in the chart at this time, but I have requested for  an EKG to be done inpatient.   ASSESSMENT:  The patient is a 50 year old with history of hypertension,  rheumatoid arthritis, and obstructive sleep apnea, being admitted on the  account of new-onset congestive heart failure.  1. New-onset congestive heart failure.  2. History of rheumatoid arthritis.  Currently on prednisone.  3. History of hypertension, appears to be well controlled.   PLAN:  1. We would admit to inpatient with telemetry.  2. Cardiology consultation.  I have discussed the case with Dr.      Laural Benes who is the cardiologist on-call who would come to evaluate      the patient.  3. We will follow up on cardiac enzymes to rule out any associated      acute coronary artery syndrome.  Initial cardiac enzymes were,      however, negative.  4. We will get a 12-lead EKG.  5. We will get a 2-D echocardiogram to evaluate cardiac function.  6. We will hold off on statin and beta-blocker until the morning.      When the patient's CHF decompensation is improved, at that point we      will place the patient on a beta-blocker.  However, we will go      ahead and place on IV Lasix 40 mg q.12 h. and lisinopril 5 mg p.o.      daily.  7. We will hold her outpatient dose of Flexeril as this could worsen      her ongoing congestive heart failure at this time.  8. We will continue her outpatient medication including prednisone for      her treatment of rheumatoid arthritis.  9. Prophylaxis.  The patient has been  placed on Lovenox and Protonix.      Case and plan discussed extensively with the patient.  She voiced      understanding and she is in agreement with the plan of care.      Jude Enid Baas, MD     JO/MEDQ  D:  10/30/2008  T:  10/31/2008  Job:  [id number]

## 2010-10-01 NOTE — Op Note (Signed)
NAMETHERASA, [name]           ACCOUNT NO.:  [id number]   MEDICAL RECORD NO.:  [id number]          PATIENT TYPE:  AMB   LOCATION:  DSC                          FACILITY:  [facility]   PHYSICIAN:  Harvie Junior, M.D.   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   POSTOPERATIVE DIAGNOSIS:  Lateral meniscus tear with degenerative joint  disease of the of the lateral compartment.   PROCEDURE:  Right knee arthroscopy with debridement of posterior horn and  mid body lateral meniscus tear as well as debridement of chondromalacia  patella.   SURGEON:  Harvie Junior, M.D.   ASSISTANT:  Marshia Ly, P.A.   ANESTHESIA:  General.   BRIEF HISTORY:  The patient is a 50 year old female with a long history of  having right knee pain.  She also underwent MRI examination which showed a  lateral meniscus tear and lateral compartment degenerative joint disease.  She had tolerated the procedures reasonably well, but ultimately was felt to  need knee arthroscopy because of the continuing complaints of pain and  failure of conservative care. She is brought to the operating room for the  above procedure.   DESCRIPTION OF PROCEDURE:  The patient was brought to the operating room and  after adequate anesthesia was obtained with general anesthesia, the patient  was placed on the operating table and the right knee was prepped and draped  in the usual sterile fashion.  Following this, routine arthroscopic  examination of the knee revealed that there was no significant  chondromalacia of the medial femoral condyle.  The medial meniscus was  within normal limits.  The anterior cruciate ligament was within normal  limits.  There was a fair amount of synovitis which was debrided, but  ultimately returned to the  lateral compartment which showed a posterior horn  lateral meniscus tear.  This was debrided with the straight biting forceps  and the remaining meniscal rim was contoured down with the suction shaver.  Attention was then turned mid body anteriorly where there was also some  fraying of the meniscus and this was debrided. Attention was turned to the  lateral femoral condyle.  It really looked quite good with no evidence of a  chondromalacia or significant chondral defect.  The lateral tibial plateau  showed some chondromalacia, but again not terrible.  Attention was turned to  the patellofemoral joint and showed relatively midline patellar tracking but  some chondromalacia of the patella. This was minimally debrided.  At this  point the knee was copiously irrigated and suctioned dry.  The arthroscopic  portals were closed with a bandage.  Prior to  closure, 25 mL of 1/4% plain Marcaine with 4 mg of morphine was instilled in  the knee for postoperative anesthesia.  The patient was taken to the  recovery room where she was noted to be in satisfactory condition.  Estimated blood loss for the procedure was none.  JLG/MEDQ  D:  06/11/2004  T:  06/11/2004  Job:  [id number]

## 2010-10-01 NOTE — Procedures (Signed)
[name], [name]           ACCOUNT NO.:  [id number]   MEDICAL RECORD NO.:  [id number]          PATIENT TYPE:  OUT   LOCATION:  SLEEP CENTER                 FACILITY:  [facility]   PHYSICIAN:  Clinton D. Maple Hudson, MD, FCCP, FACPDATE OF BIRTH:  Aug 24[birth year] 1961   DATE OF STUDY:  03/14/2006                              NOCTURNAL POLYSOMNOGRAM   REFERRING PHYSICIAN:  Maurice March, M.D.   INDICATION FOR STUDY:  Hypersomnia with sleep apnea.   Epworth sleepiness score 6/24, BMI 44.9, weight 280 pounds.   HOME MEDICATIONS:  Neurontin, Protonix.   SLEEP ARCHITECTURE:  Total sleep time 264 minutes with sleep efficiency 64%.  Stage I was 12%, stage II 69%.  Stages III and IV were absent. REM 19% of  total sleep time.  Sleep latency 66 minutes.  REM latency 135 minutes.  Awake after sleep onset 84 minutes.  Arousal index 44.5.  Tylenol P.M. was  taken at 10 p.m. to help sleep and two Advil tablets were taken at 1:06 a.m.  for headache.   RESPIRATORY DATA:  Split study protocol.  Apnea/hypotonia index (AHIRDI)  23.3 obstructive events per hour. This included 10 obstructive apneas, one  central apnea, two mixed apneas and 34 hypotonias before CPAP.  The events  were not positional.  REMAHI 7.1 per hour.  CPAP was titrated to 11 CWP, AHI  3.5 per hour.  A medium Resmed Quattro full face mask was used with a heated  humidifier.   OXYGEN DATA:  Moderate to occasionally snoring with oxygen desaturation to a  nadir of 88%.  Mean oxygen saturation after CPAP control was 96% on room  air.   CARDIAC DATA:  Sinus rhythm with occasional PVC.   MOVEMENT/PARASOMNIA:  A total of 53 limb jerks were recorded of which 44  were associated with arousal or awakening for a periodic limb movement with  arousal index of 10 per hour.   IMPRESSION/RECOMMENDATIONS:  1. Moderate obstructive sleep apnea hypotonia syndrome, AHI 23.3 per hour      with nonpositional events, moderate to loud snoring and oxygen    desaturation to 88%.  2. Successful CPAP titration to 11 CWP, AHI 3.5 per hour.  A medium Resmed      Quattro full face mask was used with heated humidifier.  3. Periodic limb movement with arousal, 10 per hour.  This is of less      certain significance during sleep      disturbance associated with CPAP titration.  If ongoing limb jerks are      reported as a problem with sleep after adjustment to home CPAP, then      consider specific therapy if warranted.      Clinton D. Maple Hudson, MD, [facility], FACP  Diplomate, Biomedical engineer of Sleep Medicine  Electronically Signed     CDY/MEDQ  D:  03/19/2006 10:05:51  T:  03/20/2006 08:07:03  Job:  [id number]

## 2010-10-24 ENCOUNTER — Emergency Department (HOSPITAL_COMMUNITY)
Admission: EM | Admit: 2010-10-24 | Discharge: 2010-10-24 | Disposition: A | Discharge status: Home / Self Care | Payer: Medicaid Other | Attending: Emergency Medicine | Admitting: Emergency Medicine

## 2010-10-24 DIAGNOSIS — Z79899 Other long term (current) drug therapy: Secondary | ICD-10-CM | POA: Insufficient documentation

## 2010-10-24 DIAGNOSIS — F329 Major depressive disorder, single episode, unspecified: Secondary | ICD-10-CM | POA: Insufficient documentation

## 2010-10-24 DIAGNOSIS — I509 Heart failure, unspecified: Secondary | ICD-10-CM | POA: Insufficient documentation

## 2010-10-24 DIAGNOSIS — I10 Essential (primary) hypertension: Secondary | ICD-10-CM | POA: Insufficient documentation

## 2010-10-24 DIAGNOSIS — G4700 Insomnia, unspecified: Secondary | ICD-10-CM | POA: Insufficient documentation

## 2010-10-24 DIAGNOSIS — J449 Chronic obstructive pulmonary disease, unspecified: Secondary | ICD-10-CM | POA: Insufficient documentation

## 2010-10-24 DIAGNOSIS — M129 Arthropathy, unspecified: Secondary | ICD-10-CM | POA: Insufficient documentation

## 2010-10-24 DIAGNOSIS — F3289 Other specified depressive episodes: Secondary | ICD-10-CM | POA: Insufficient documentation

## 2010-10-24 DIAGNOSIS — F411 Generalized anxiety disorder: Secondary | ICD-10-CM | POA: Insufficient documentation

## 2010-10-24 DIAGNOSIS — J4489 Other specified chronic obstructive pulmonary disease: Secondary | ICD-10-CM | POA: Insufficient documentation

## 2010-10-24 LAB — CBC
HCT: 37 % (ref 36.0–46.0)
MCH: 24.2 pg — ABNORMAL LOW (ref 26.0–34.0)
MCHC: 31.9 g/dL (ref 30.0–36.0)
MCV: 75.8 fL — ABNORMAL LOW (ref 78.0–100.0)
Platelets: 312 10*3/uL (ref 150–400)
RDW: 16.8 % — ABNORMAL HIGH (ref 11.5–15.5)
WBC: 6.3 10*3/uL (ref 4.0–10.5)

## 2010-10-24 LAB — COMPREHENSIVE METABOLIC PANEL
AST: 13 U/L (ref 0–37)
Albumin: 3.2 g/dL — ABNORMAL LOW (ref 3.5–5.2)
BUN: 8 mg/dL (ref 6–23)
Calcium: 9.1 mg/dL (ref 8.4–10.5)
Creatinine, Ser: 0.7 mg/dL (ref 0.4–1.2)
Total Bilirubin: 0.3 mg/dL (ref 0.3–1.2)
Total Protein: 7.8 g/dL (ref 6.0–8.3)

## 2010-10-29 ENCOUNTER — Emergency Department (HOSPITAL_COMMUNITY)
Admission: EM | Admit: 2010-10-29 | Discharge: 2010-10-29 | Disposition: A | Discharge status: Home / Self Care | Payer: Medicaid Other | Attending: Emergency Medicine | Admitting: Emergency Medicine

## 2010-10-29 DIAGNOSIS — G4700 Insomnia, unspecified: Secondary | ICD-10-CM | POA: Insufficient documentation

## 2010-10-29 DIAGNOSIS — Z79899 Other long term (current) drug therapy: Secondary | ICD-10-CM | POA: Insufficient documentation

## 2010-10-29 DIAGNOSIS — T50991A Poisoning by other drugs, medicaments and biological substances, accidental (unintentional), initial encounter: Secondary | ICD-10-CM | POA: Insufficient documentation

## 2010-10-29 DIAGNOSIS — F341 Dysthymic disorder: Secondary | ICD-10-CM | POA: Insufficient documentation

## 2010-10-29 DIAGNOSIS — Y92009 Unspecified place in unspecified non-institutional (private) residence as the place of occurrence of the external cause: Secondary | ICD-10-CM | POA: Insufficient documentation

## 2010-10-29 DIAGNOSIS — R404 Transient alteration of awareness: Secondary | ICD-10-CM | POA: Insufficient documentation

## 2010-10-29 DIAGNOSIS — I10 Essential (primary) hypertension: Secondary | ICD-10-CM | POA: Insufficient documentation

## 2010-10-29 LAB — RAPID URINE DRUG SCREEN, HOSP PERFORMED
Amphetamines: NOT DETECTED
Cocaine: NOT DETECTED
Opiates: NOT DETECTED
Tetrahydrocannabinol: NOT DETECTED

## 2010-10-29 LAB — POCT I-STAT, CHEM 8
Chloride: 106 mEq/L (ref 96–112)
Creatinine, Ser: 0.9 mg/dL (ref 0.50–1.10)
Glucose, Bld: 98 mg/dL (ref 70–99)
Hemoglobin: 13.6 g/dL (ref 12.0–15.0)
Potassium: 3.7 mEq/L (ref 3.5–5.1)
Sodium: 143 mEq/L (ref 135–145)

## 2010-10-29 LAB — SALICYLATE LEVEL: Salicylate Lvl: <2 mg/dL — ABNORMAL LOW (ref 2.8–20.0)

## 2011-01-25 ENCOUNTER — Ambulatory Visit: Payer: Medicaid Other | Attending: Family Medicine | Admitting: Physical Therapy

## 2011-01-25 DIAGNOSIS — IMO0001 Reserved for inherently not codable concepts without codable children: Secondary | ICD-10-CM | POA: Insufficient documentation

## 2011-01-25 DIAGNOSIS — M25669 Stiffness of unspecified knee, not elsewhere classified: Secondary | ICD-10-CM | POA: Insufficient documentation

## 2011-01-25 DIAGNOSIS — R262 Difficulty in walking, not elsewhere classified: Secondary | ICD-10-CM | POA: Insufficient documentation

## 2011-01-25 DIAGNOSIS — M25569 Pain in unspecified knee: Secondary | ICD-10-CM | POA: Insufficient documentation

## 2011-01-25 DIAGNOSIS — M6281 Muscle weakness (generalized): Secondary | ICD-10-CM | POA: Insufficient documentation

## 2011-02-01 ENCOUNTER — Ambulatory Visit: Payer: Medicaid Other

## 2011-02-10 ENCOUNTER — Ambulatory Visit: Payer: Medicaid Other | Admitting: Rehabilitative and Restorative Service Providers"

## 2011-02-14 LAB — BASIC METABOLIC PANEL
CO2: 28
Calcium: 8.9
GFR calc Af Amer: >60
GFR calc non Af Amer: >60
Potassium: 3.5
Sodium: 141

## 2011-02-14 LAB — CBC
HCT: 32.6 — ABNORMAL LOW
Hemoglobin: 10.5 — ABNORMAL LOW
RBC: 4.5

## 2011-02-14 LAB — DIFFERENTIAL
Eosinophils Relative: 2
Lymphocytes Relative: 22
Lymphs Abs: 1.2
Monocytes Absolute: 0.2
Monocytes Relative: 4
Neutro Abs: 3.8

## 2011-02-15 ENCOUNTER — Ambulatory Visit: Payer: Medicaid Other | Attending: Family Medicine | Admitting: Rehabilitative and Restorative Service Providers"

## 2011-02-15 DIAGNOSIS — M6281 Muscle weakness (generalized): Secondary | ICD-10-CM | POA: Insufficient documentation

## 2011-02-15 DIAGNOSIS — M25569 Pain in unspecified knee: Secondary | ICD-10-CM | POA: Insufficient documentation

## 2011-02-15 DIAGNOSIS — R262 Difficulty in walking, not elsewhere classified: Secondary | ICD-10-CM | POA: Insufficient documentation

## 2011-02-15 DIAGNOSIS — M25669 Stiffness of unspecified knee, not elsewhere classified: Secondary | ICD-10-CM | POA: Insufficient documentation

## 2011-02-15 DIAGNOSIS — IMO0001 Reserved for inherently not codable concepts without codable children: Secondary | ICD-10-CM | POA: Insufficient documentation

## 2011-02-21 LAB — CBC
HCT: 35.9 — ABNORMAL LOW
Hemoglobin: 11.8 — ABNORMAL LOW
MCV: 76.1 — ABNORMAL LOW
RBC: 4.72
WBC: 5.3

## 2011-02-21 LAB — DIFFERENTIAL
Basophils Absolute: 0
Eosinophils Absolute: 0.2
Eosinophils Relative: 3
Lymphocytes Relative: 26

## 2011-02-21 LAB — COMPREHENSIVE METABOLIC PANEL
AST: 25
CO2: 26
Chloride: 105
Creatinine, Ser: 0.74
GFR calc Af Amer: >60
GFR calc non Af Amer: >60
Glucose, Bld: 115 — ABNORMAL HIGH
Total Bilirubin: 0.9

## 2011-02-21 LAB — URINALYSIS, ROUTINE W REFLEX MICROSCOPIC
Bilirubin Urine: NEGATIVE
Glucose, UA: NEGATIVE
Hgb urine dipstick: NEGATIVE
Nitrite: NEGATIVE
Specific Gravity, Urine: 1.021
pH: 7

## 2011-02-21 LAB — PREGNANCY, URINE: Preg Test, Ur: NEGATIVE

## 2011-02-21 LAB — LIPASE, BLOOD: Lipase: 17

## 2011-02-23 ENCOUNTER — Ambulatory Visit: Payer: Medicaid Other | Admitting: Rehabilitative and Restorative Service Providers"

## 2011-03-21 ENCOUNTER — Encounter: Payer: Self-pay | Admitting: Cardiology

## 2011-03-21 ENCOUNTER — Ambulatory Visit (INDEPENDENT_AMBULATORY_CARE_PROVIDER_SITE_OTHER): Payer: Medicaid Other | Admitting: Cardiology

## 2011-03-21 DIAGNOSIS — I10 Essential (primary) hypertension: Secondary | ICD-10-CM

## 2011-03-21 DIAGNOSIS — I5022 Chronic systolic (congestive) heart failure: Secondary | ICD-10-CM

## 2011-03-21 DIAGNOSIS — I509 Heart failure, unspecified: Secondary | ICD-10-CM

## 2011-03-21 DIAGNOSIS — I059 Rheumatic mitral valve disease, unspecified: Secondary | ICD-10-CM

## 2011-03-21 DIAGNOSIS — I080 Rheumatic disorders of both mitral and aortic valves: Secondary | ICD-10-CM

## 2011-03-21 MED ORDER — CARVEDILOL 6.25 MG PO TABS: 6.2500 mg | ORAL_TABLET | Freq: Two times a day (BID) | ORAL | Status: DC

## 2011-03-21 MED ORDER — LOSARTAN POTASSIUM 50 MG PO TABS: 50.0000 mg | ORAL_TABLET | Freq: Every day | ORAL | Status: DC

## 2011-03-21 MED ORDER — TORSEMIDE 20 MG PO TABS: ORAL_TABLET | ORAL | Status: DC

## 2011-03-21 NOTE — Patient Instructions (Addendum)
Take losartan 50mg  daily.  Take torsemide 40mg  daily--this will be two 20mg  tablets daily in the morning.  Taek coreg(carvedilol) 6.25mg  twice a day.   Take aspirin 81mg  daily.  Your physician recommends that you return for lab work in: 2 weeks---BMP/BNP 428.22 424.0  Your physician has requested that you have an echocardiogram. Echocardiography is a painless test that uses sound waves to create images of your heart. It provides your doctor with information about the size and shape of your heart and how well your heart's chambers and valves are working. This procedure takes approximately one hour. There are no restrictions for this procedure.    Your physician recommends that you schedule a follow-up appointment in: 1 month with Dr Shirlee Latch.

## 2011-03-21 NOTE — Progress Notes (Signed)
PCP: [facility]  50 yo with systolic CHF probably secondary to severe MR now s/p MV repair, rheumatoid arthritis, and PUD presents for followup. Last echo showed EF improved to 45%.   [name] has not been seen in the office for a while now.  It sounds like she has been diagnosed with schizophrenia and is following with a psychiatrist who has her on risperidal.   She has quit taking all her cardiac medications and has been off them for several months.  She is not sure why she stopped them.  Her weight has been rising.  BP has been high; it is 144/90 today.  She is very inactive mainly due to knee pain.  She can walk on flat ground for short distances without dyspnea.  She cannot go far, however, with her knee pain.  Chronic 2-pillow orthopnea.    Labs (12/23/09) K 4.4, creatinine 1.1, BNP 145  Labs (9/11): K 3.9, creatinine 1.0, BNP 84, LFTs normal, HCT 29.3  Labs (10/11): K 4.0, creatinine 1.55 => 1.2, uric acid 13, TSH normal, BNP 75 => 172  Labs (11/11): BNP 110 => 105, creatinine 3.6 => 1.2 => 1.5, K 3.7  Labs (1/12): K 3.5, creatinine 1.2, BNP 70  Labs (2/12): LDL 105, HDL 39 Labs (6/12): 3.7, creatinine 1.12  Allergies:  1) ! * Colchicine  2) ! Morphine   Past History:  1. Congestive heart failure, most likely secondary to severe mitral regurgitation. TEE (6/10): EF 40%, diffuse hypokinesis, mild to moderately dilated LV, severe eccentric MR, small PFO. Cardiac MRI (6/10): EF 37% with global hypokinesis, moderate to severe MR, no delayed enhancement (no evidence for sarcoidosis or other infiltrative disease). TTE (10/10) after MV repair: EF 25-30%, moderately dilated LV, moderate diastolic dysfunction, mildly depressed RV function, trivial MR, MV mean gradient 5 mmHg, PASP 30 mmHg. RHC (12/10): mean RA 19, PA 46/26, mean PCWP 28, CI 2.5, SVO2 63%. TTE (2/11): EF 35-40% with diffuse hypokinesis, no significant MR or MS. TTE (7/11): EF 45%, mild global hypokinesis, s/p MV repair, no mitral  regurgitation, minimal mitral stenosis, PA systolic pressure 40 mmHg, mild LV dilation.  2. Severe mitral regurgitation (see above). Minimally invasive mitral valve repair on 12/18/08. She additionally had TV repair and PFO closure.  3. Microcytic anemia: EGD and colonoscopy in 12/11 did not reveal source of bleeding. Plan capsule endoscopy.  4. Gastric ulcers.  5. Morbid obesity.  6. Obstructive sleep apnea, on CPAP.  7. GERD.  8. Hypertension, controlled.  9. Rheumatoid arthritis  10. Gout.  11. Depression.  12. LHC (6/10): No angiographic CAD. Mildly dilated LV. 3+ MR. EF 40%.  13. Prior smoking, quit  14. Colitis (2/11)  15. Schizophrenia: on Risperdal  16. History of right leg DVT   Family History:  Negative for cardiac or renal disease.  No FH of Colon Cancer  Social History:  Single, 5 children. Unemployed.  Tobacco Use - Yes. Smoked < 1 ppd, quit 6/10.  Alcohol Use - no  Regular Exercise - no  Drug Use - no, hx crack-cocaine use  Daily Caffeine Use: 2 daily   Review of Systems  All systems reviewed and negative except as per HPI.  Current Outpatient Prescriptions  Medication Sig Dispense Refill  . LISINOPRIL PO Take by mouth daily.        Marland Kitchen PRESCRIPTION MEDICATION Take 4 mg by mouth. Risperidone 4 mg :take one tablet daily.       . TRAZODONE HCL PO Take by  mouth daily.        Marland Kitchen albuterol (PROVENTIL HFA;VENTOLIN HFA) 108 (90 BASE) MCG/ACT inhaler Inhale 1 puff into the lungs every 4 (four) hours as needed.        Marland Kitchen allopurinol (ZYLOPRIM) 300 MG tablet Take 300 mg by mouth daily.        Marland Kitchen ALPRAZolam (XANAX) 0.5 MG tablet Take 0.5 mg by mouth at bedtime as needed.        Marland Kitchen aspirin 81 MG tablet Take 1 tablet (81 mg total) by mouth daily.      . carvedilol (COREG) 6.25 MG tablet Take 1 tablet (6.25 mg total) by mouth 2 (two) times daily.  60 tablet  6  . citalopram (CELEXA) 40 MG tablet Take 40 mg by mouth daily.        . ergocalciferol (VITAMIN D2) 50000 UNITS capsule  Take 50,000 Units by mouth once a week.        . esomeprazole (NEXIUM) 40 MG capsule Take 40 mg by mouth daily before breakfast.        . hydrALAZINE (APRESOLINE) 25 MG tablet Take 25 mg by mouth 3 (three) times daily. Take one and one-half tablet three times a day.       . iron polysaccharides (NIFEREX) 150 MG capsule Take 150 mg by mouth daily.        . isosorbide mononitrate (IMDUR) 60 MG 24 hr tablet Take 60 mg by mouth daily.        Marland Kitchen losartan (COZAAR) 50 MG tablet Take 1 tablet (50 mg total) by mouth daily.  30 tablet  6  . metolazone (ZAROXOLYN) 2.5 MG tablet Take 2.5 mg by mouth as directed. Take one tablet every Friday.(Take this  30 minutes before you take Torsemide.)       . oxyCODONE-acetaminophen (PERCOCET) 5-325 MG per tablet Take 1 tablet by mouth as directed.        . potassium chloride SA (K-DUR,KLOR-CON) 20 MEQ tablet Take 20 mEq by mouth daily. Take two on days that you have Metolazone.       . promethazine (PHENERGAN) 12.5 MG tablet Take 12.5 mg by mouth every 6 (six) hours as needed.        Marland Kitchen spironolactone (ALDACTONE) 25 MG tablet Take 25 mg by mouth daily.        Marland Kitchen torsemide (DEMADEX) 20 MG tablet Take two tablets daily in the morning  60 tablet  6    BP 144/90  Pulse 102  Wt 336 lb (152.409 kg) General: NAD, obese.  Neck: JVP 10 cm, no thyromegaly or thyroid nodule.  Lungs: Clear to auscultation bilaterally with normal respiratory effort. CV: Nondisplaced PMI.  Heart regular S1/S2, no S3/S4, no murmur.  No peripheral edema.  No carotid bruit.  Normal pedal pulses.  Abdomen: Soft, nontender, no hepatosplenomegaly, no distention.  Neurologic: Alert and oriented x 3.  Psych: Normal affect. Extremities: No clubbing or cyanosis.

## 2011-03-22 DIAGNOSIS — I10 Essential (primary) hypertension: Secondary | ICD-10-CM | POA: Insufficient documentation

## 2011-03-22 NOTE — Assessment & Plan Note (Signed)
Last echo showed improved EF (not normal).  However, she has been off her meds for months now, and she appears volume overloaded with NYHA class II-III symptoms.   - Start torsemide 40 mg daily with BMET/BNP in 2 weeks. - Restart Coreg 6.25 mg bid and losartan 50 mg daily.   - Repeat Echo for EF.  - Followup in the office in 1 month.

## 2011-03-22 NOTE — Assessment & Plan Note (Signed)
Restarting Coreg and losartan.

## 2011-03-22 NOTE — Assessment & Plan Note (Signed)
Patient is s/p MV repair.  I do not hear a significant murmur but body habitus with auscultation difficult.  I will get an echo to reassess her MV and EF.

## 2011-04-04 ENCOUNTER — Other Ambulatory Visit: Payer: Medicaid Other | Admitting: *Deleted

## 2011-04-04 ENCOUNTER — Other Ambulatory Visit (HOSPITAL_COMMUNITY): Payer: Medicaid Other | Admitting: Radiology

## 2011-04-08 ENCOUNTER — Ambulatory Visit: Payer: Medicaid Other | Admitting: *Deleted

## 2011-04-08 ENCOUNTER — Ambulatory Visit (HOSPITAL_COMMUNITY): Payer: Medicare Other | Attending: Cardiology | Admitting: Radiology

## 2011-04-08 DIAGNOSIS — I059 Rheumatic mitral valve disease, unspecified: Secondary | ICD-10-CM

## 2011-04-08 DIAGNOSIS — I079 Rheumatic tricuspid valve disease, unspecified: Secondary | ICD-10-CM | POA: Insufficient documentation

## 2011-04-08 DIAGNOSIS — I5022 Chronic systolic (congestive) heart failure: Secondary | ICD-10-CM

## 2011-04-08 DIAGNOSIS — I379 Nonrheumatic pulmonary valve disorder, unspecified: Secondary | ICD-10-CM | POA: Insufficient documentation

## 2011-04-08 LAB — BASIC METABOLIC PANEL
BUN: 9 mg/dL (ref 6–23)
Chloride: 103 mEq/L (ref 96–112)
Glucose, Bld: 98 mg/dL (ref 70–99)
Potassium: 4.8 mEq/L (ref 3.5–5.3)
Sodium: 141 mEq/L (ref 135–145)

## 2011-04-27 ENCOUNTER — Encounter: Payer: Self-pay | Admitting: Cardiology

## 2011-04-27 ENCOUNTER — Ambulatory Visit (INDEPENDENT_AMBULATORY_CARE_PROVIDER_SITE_OTHER): Payer: Medicaid Other | Admitting: Cardiology

## 2011-04-27 VITALS — BP 154/82 | HR 101 | Ht 67.0 in | Wt 345.0 lb

## 2011-04-27 DIAGNOSIS — I5022 Chronic systolic (congestive) heart failure: Secondary | ICD-10-CM

## 2011-04-27 DIAGNOSIS — I5023 Acute on chronic systolic (congestive) heart failure: Secondary | ICD-10-CM

## 2011-04-27 DIAGNOSIS — I059 Rheumatic mitral valve disease, unspecified: Secondary | ICD-10-CM

## 2011-04-27 MED ORDER — CARVEDILOL 12.5 MG PO TABS: 12.5000 mg | ORAL_TABLET | Freq: Two times a day (BID) | ORAL | Status: DC

## 2011-04-27 MED ORDER — TORSEMIDE 20 MG PO TABS: ORAL_TABLET | ORAL | Status: DC

## 2011-04-27 MED ORDER — LOSARTAN POTASSIUM 100 MG PO TABS: 100.0000 mg | ORAL_TABLET | Freq: Every day | ORAL | Status: DC

## 2011-04-27 NOTE — Patient Instructions (Signed)
Take torsemide 80mg  daily  for 3 days, then decrease torsemide to 60mg  daily. This will be four 20mg  tablets daily at the same time for 3 days, the three 20mg  tablets daily at the same time. Continue this dose.   Increase coreg(carvedilol) to 12.5mg  twice a day.  Increase losartan to 100mg  daily.  Your physician recommends that you return for lab work in: 1 week--BMET/BNP   Schedule an appointment with the NP  in the Heart Failure Clinic at [facility] in 2 weeks.  Your physician recommends that you schedule a follow-up appointment in: 6 weeks with Dr Shirlee Latch.

## 2011-04-28 NOTE — Progress Notes (Signed)
PCP: [facility]  50 yo with systolic CHF probably secondary to severe MR now s/p MV repair, rheumatoid arthritis, and PUD presents for followup. Last echo showed EF 30-35%. She has been diagnosed with schizophrenia or schizoaffective disorder and sees a psychiatrist.  She says she has not slept in "a month."   She has started taking her medications again.  However, she is short of breath after walking about 50 feet.  This is getting worse.  No chest pain.   She is very inactive due to knee pain and dyspnea.  Chronic 2-pillow orthopnea.  Her feet have been swelling.    ECG: NSR, anterior T wave flattening  Labs (12/23/09) K 4.4, creatinine 1.1, BNP 145  Labs (9/11): K 3.9, creatinine 1.0, BNP 84, LFTs normal, HCT 29.3  Labs (10/11): K 4.0, creatinine 1.55 => 1.2, uric acid 13, TSH normal, BNP 75 => 172  Labs (11/11): BNP 110 => 105, creatinine 3.6 => 1.2 => 1.5, K 3.7  Labs (1/12): K 3.5, creatinine 1.2, BNP 70  Labs (2/12): LDL 105, HDL 39 Labs (6/12): 3.7, creatinine 1.12 Labs (11/12): K 4.8, creatinine 0.85, BNP 88  Allergies:  1) ! * Colchicine  2) ! Morphine   Past History:  1. Congestive heart failure, most likely secondary to severe mitral regurgitation. TEE (6/10): EF 40%, diffuse hypokinesis, mild to moderately dilated LV, severe eccentric MR, small PFO. Cardiac MRI (6/10): EF 37% with global hypokinesis, moderate to severe MR, no delayed enhancement (no evidence for sarcoidosis or other infiltrative disease). TTE (10/10) after MV repair: EF 25-30%, moderately dilated LV, moderate diastolic dysfunction, mildly depressed RV function, trivial MR, MV mean gradient 5 mmHg, PASP 30 mmHg. RHC (12/10): mean RA 19, PA 46/26, mean PCWP 28, CI 2.5, SVO2 63%. TTE (2/11): EF 35-40% with diffuse hypokinesis, no significant MR or MS. TTE (7/11): EF 45%, mild global hypokinesis, s/p MV repair, no mitral regurgitation, minimal mitral stenosis, PA systolic pressure 40 mmHg, mild LV dilation.  TTE  (11/12): EF 30-35%, mild LV dilation, mild LVH, s/p MV repair with no regurgitation and minimal stenosis.  2. Severe mitral regurgitation (see above). Minimally invasive mitral valve repair on 12/18/08. She additionally had TV repair and PFO closure.  3. Microcytic anemia: EGD and colonoscopy in 12/11 did not reveal source of bleeding. Plan capsule endoscopy.  4. Gastric ulcers.  5. Morbid obesity.  6. Obstructive sleep apnea, on CPAP.  7. GERD.  8. Hypertension, controlled.  9. Rheumatoid arthritis  10. Gout.  11. Depression.  12. LHC (6/10): No angiographic CAD. Mildly dilated LV. 3+ MR. EF 40%.  13. Prior smoking, quit  14. Colitis (2/11)  15. Schizophrenia versus schizoaffective disorder: on Risperdal  16. History of right leg DVT   Family History:  Negative for cardiac or renal disease.  No FH of Colon Cancer  Social History:  Single, 5 children. Unemployed.  Tobacco Use - Yes. Smoked < 1 ppd, quit 6/10.  Alcohol Use - no  Regular Exercise - no  Drug Use - no, hx crack-cocaine use  Daily Caffeine Use: 2 daily   Review of Systems  All systems reviewed and negative except as per HPI.  Current Outpatient Prescriptions  Medication Sig Dispense Refill  . albuterol (PROVENTIL HFA;VENTOLIN HFA) 108 (90 BASE) MCG/ACT inhaler Inhale 1 puff into the lungs every 4 (four) hours as needed.        Marland Kitchen aspirin 81 MG tablet Take 1 tablet (81 mg total) by mouth daily.      Marland Kitchen  esomeprazole (NEXIUM) 40 MG capsule Take 40 mg by mouth daily before breakfast.        . PRESCRIPTION MEDICATION Take 4 mg by mouth. Risperidone 4 mg :take one tablet daily.       Marland Kitchen spironolactone (ALDACTONE) 25 MG tablet Take 25 mg by mouth daily.        . TRAZODONE HCL PO Take by mouth daily.        . carvedilol (COREG) 12.5 MG tablet Take 1 tablet (12.5 mg total) by mouth 2 (two) times daily.  60 tablet  6  . losartan (COZAAR) 100 MG tablet Take 1 tablet (100 mg total) by mouth daily.  30 tablet  6  . torsemide  (DEMADEX) 20 MG tablet Take four tablets daily at the same time for 3 days then take three tablets daily at the same time. Stay on this dose.  120 tablet  1    BP 154/82  Pulse 101  Ht 5\' 7"  (1.702 m)  Wt 156.491 kg (345 lb)  BMI 54.03 kg/m2  SpO2 98% General: NAD, obese.  Neck: JVP 12 cm, no thyromegaly or thyroid nodule.  Lungs: Clear to auscultation bilaterally with normal respiratory effort. CV: Nondisplaced PMI.  Heart regular S1/S2, no S3/S4, no murmur.  1+ edema 1/2 up lower legs bilaterally.  No carotid bruit.  Normal pedal pulses.  Abdomen: Soft, nontender, no hepatosplenomegaly, no distention.  Neurologic: Alert and oriented x 3.  Psych: Normal affect. Extremities: No clubbing or cyanosis.

## 2011-04-28 NOTE — Assessment & Plan Note (Addendum)
Patient is s/p MV repair.  No MR and only minimal Tasha on last echo.    [name] should followup at the CHF clinic in 2 weeks and with me in 6 weeks.

## 2011-04-28 NOTE — Assessment & Plan Note (Addendum)
EF back down to 30-35% on last echo.  Currently NYHA class III symptoms with worsening volume overload despite taking her torsemide regularly.  BNP tends to run low in her, probably due to obesity.  BP is high today.   - Increase torsemide to 80 mg daily for 3 days, then 60 mg daily long-term.  - Increase Coreg to 12.5 mg bid and increase losartan to 100 mg daily. - Conitnue spironolactone.  - BMET/BNP in 1 week.

## 2011-05-04 ENCOUNTER — Other Ambulatory Visit (INDEPENDENT_AMBULATORY_CARE_PROVIDER_SITE_OTHER): Payer: Medicare Other | Admitting: *Deleted

## 2011-05-04 DIAGNOSIS — R0602 Shortness of breath: Secondary | ICD-10-CM

## 2011-05-04 DIAGNOSIS — I5023 Acute on chronic systolic (congestive) heart failure: Secondary | ICD-10-CM

## 2011-05-05 ENCOUNTER — Ambulatory Visit (HOSPITAL_COMMUNITY): Payer: Medicaid Other

## 2011-05-05 LAB — BASIC METABOLIC PANEL
CO2: 31 mEq/L (ref 19–32)
Calcium: 9.2 mg/dL (ref 8.4–10.5)
Creatinine, Ser: 1.1 mg/dL (ref 0.4–1.2)
Glucose, Bld: 97 mg/dL (ref 70–99)

## 2011-05-05 LAB — BRAIN NATRIURETIC PEPTIDE: Pro B Natriuretic peptide (BNP): 32 pg/mL (ref 0.0–100.0)

## 2011-06-09 ENCOUNTER — Encounter: Payer: Self-pay | Admitting: Cardiology

## 2011-06-09 ENCOUNTER — Ambulatory Visit (INDEPENDENT_AMBULATORY_CARE_PROVIDER_SITE_OTHER): Payer: Medicaid Other | Admitting: Cardiology

## 2011-06-09 VITALS — BP 132/90 | HR 64 | Ht 67.0 in | Wt 342.0 lb

## 2011-06-09 DIAGNOSIS — I059 Rheumatic mitral valve disease, unspecified: Secondary | ICD-10-CM

## 2011-06-09 DIAGNOSIS — I5022 Chronic systolic (congestive) heart failure: Secondary | ICD-10-CM

## 2011-06-09 MED ORDER — TORSEMIDE 20 MG PO TABS: 20.0000 mg | ORAL_TABLET | Freq: Every day | ORAL | Status: DC

## 2011-06-09 MED ORDER — ISOSORB DINITRATE-HYDRALAZINE 20-37.5 MG PO TABS: 1.0000 | ORAL_TABLET | Freq: Three times a day (TID) | ORAL | Status: DC

## 2011-06-09 NOTE — Patient Instructions (Addendum)
Take BiDil (isosorbide-hydralazine)   20-37.5mg  three times a day.  Increase torsemide to 80mg  daily--this will be four 20mg  tablets daily at the same time in the morning.  Your physician recommends that you return for lab work in: 2 weeks--BMET  Schedule an appointment with the NP in the Hear Failure Clinic at [facility] in 1 month.    Your physician recommends that you schedule a follow-up appointment in: 2 months with Dr Shirlee Latch.

## 2011-06-10 NOTE — Progress Notes (Signed)
PCP: [facility] ([facility] [employer] [facility] [employer]  51 yo with systolic CHF probably secondary to severe MR now s/p MV repair, rheumatoid arthritis, and PUD presents for followup. Last echo showed EF 30-35%. She has been diagnosed with schizophrenia or schizoaffective disorder and sees a psychiatrist.  She is still having a lot of trouble with sleep.  At baseline, she is inactive due to knee pain bilaterally.  She has been taking her meds as ordered.  Weight is down 3 lbs since last appointment.  She is now walking on flat ground without significant dyspnea (though not walking far with knee pain).  She is short of breath with inclines or stairs.  She still has orthopnea - requires 2 large pillows.  No chest pain.    Labs (12/23/09) K 4.4, creatinine 1.1, BNP 145  Labs (9/11): K 3.9, creatinine 1.0, BNP 84, LFTs normal, HCT 29.3  Labs (10/11): K 4.0, creatinine 1.55 => 1.2, uric acid 13, TSH normal, BNP 75 => 172  Labs (11/11): BNP 110 => 105, creatinine 3.6 => 1.2 => 1.5, K 3.7  Labs (1/12): K 3.5, creatinine 1.2, BNP 70  Labs (2/12): LDL 105, HDL 39 Labs (6/12): 3.7, creatinine 1.12 Labs (11/12): K 4.8, creatinine 0.85, BNP 88 Labs (12/12): K 3.9, creatinine 1.1, BNP 32  Allergies:  1) ! * Colchicine  2) ! Morphine   Past History:  1. Congestive heart failure, most likely secondary to severe mitral regurgitation. TEE (6/10): EF 40%, diffuse hypokinesis, mild to moderately dilated LV, severe eccentric MR, small PFO. Cardiac MRI (6/10): EF 37% with global hypokinesis, moderate to severe MR, no delayed enhancement (no evidence for sarcoidosis or other infiltrative disease). TTE (10/10) after MV repair: EF 25-30%, moderately dilated LV, moderate diastolic dysfunction, mildly depressed RV function, trivial MR, MV mean gradient 5 mmHg, PASP 30 mmHg. RHC (12/10): mean RA 19, PA 46/26, mean PCWP 28, CI 2.5, SVO2 63%. TTE (2/11): EF 35-40% with diffuse hypokinesis, no significant MR or MS. TTE (7/11): EF 45%, mild  global hypokinesis, s/p MV repair, no mitral regurgitation, minimal mitral stenosis, PA systolic pressure 40 mmHg, mild LV dilation.  TTE (11/12): EF 30-35%, mild LV dilation, mild LVH, s/p MV repair with no regurgitation and minimal stenosis.  2. Severe mitral regurgitation (see above). Minimally invasive mitral valve repair on 12/18/08. She additionally had TV repair and PFO closure.  3. Microcytic anemia: EGD and colonoscopy in 12/11 did not reveal source of bleeding. Plan capsule endoscopy.  4. Gastric ulcers.  5. Morbid obesity.  6. Obstructive sleep apnea, on CPAP.  7. GERD.  8. Hypertension, controlled.  9. Rheumatoid arthritis  10. Gout.  11. Depression.  12. LHC (6/10): No angiographic CAD. Mildly dilated LV. 3+ MR. EF 40%.  13. Prior smoking, quit  14. Colitis (2/11)  15. Schizophrenia versus schizoaffective disorder: on Risperdal  16. History of right leg DVT   Family History:  Negative for cardiac or renal disease.  No FH of Colon Cancer  Social History:  Single, 5 children. Unemployed.  Tobacco Use - Yes. Smoked < 1 ppd, quit 6/10.  Alcohol Use - no  Regular Exercise - no  Drug Use - no, hx crack-cocaine use  Daily Caffeine Use: 2 daily   Review of Systems  All systems reviewed and negative except as per HPI.  Current Outpatient Prescriptions  Medication Sig Dispense Refill  . albuterol (PROVENTIL HFA;VENTOLIN HFA) 108 (90 BASE) MCG/ACT inhaler Inhale 1 puff into the lungs every 4 (four) hours as needed.        Marland Kitchen  aspirin 81 MG tablet Take 1 tablet (81 mg total) by mouth daily.      . carvedilol (COREG) 12.5 MG tablet Take 1 tablet (12.5 mg total) by mouth 2 (two) times daily.  60 tablet  6  . esomeprazole (NEXIUM) 40 MG capsule Take 40 mg by mouth daily before breakfast.        . losartan (COZAAR) 100 MG tablet Take 1 tablet (100 mg total) by mouth daily.  30 tablet  6  . PRESCRIPTION MEDICATION Take 4 mg by mouth. Risperidone 4 mg :take one tablet daily.       .  TRAZODONE HCL PO Take by mouth daily.        . isosorbide-hydrALAZINE (BIDIL) 20-37.5 MG per tablet Take 1 tablet by mouth 3 (three) times daily.  90 tablet  6  . spironolactone (ALDACTONE) 25 MG tablet Take 25 mg by mouth daily.        Marland Kitchen torsemide (DEMADEX) 20 MG tablet Take 1 tablet (20 mg total) by mouth daily.  120 tablet  6    BP 132/90  Pulse 64  Ht 5\' 7"  (1.702 m)  Wt 155.13 kg (342 lb)  BMI 53.56 kg/m2 General: NAD, obese.  Neck: JVP 10-12 cm, no thyromegaly or thyroid nodule.  Lungs: Clear to auscultation bilaterally with normal respiratory effort. CV: Nondisplaced PMI.  Heart regular S1/S2, no S3/S4, no murmur.  1+ ankle edema bilaterally.  No carotid bruit.  Normal pedal pulses.  Abdomen: Soft, nontender, no hepatosplenomegaly, no distention.  Neurologic: Alert and oriented x 3.  Psych: Normal affect. Extremities: No clubbing or cyanosis.

## 2011-06-10 NOTE — Assessment & Plan Note (Signed)
Patient is s/p MV repair.  No MR and only minimal MS on last echo. 

## 2011-06-10 NOTE — Assessment & Plan Note (Signed)
Still volume overloaded but losing weight and symptomatically better now that she is compliant with medications.   - Increase torsemide to 80 mg daily. - Continue current Coreg, losartan, and spironolactone. - Add Bidil 1 tab tid.   - BMET/BNP in 2 wks. - Followup CHF clinic 1 month, followup with me in 2 months.  - Echo in 6 months - if EF remains < 35% and she is taking her meds, ICD.

## 2011-06-20 ENCOUNTER — Other Ambulatory Visit: Payer: Medicaid Other | Admitting: *Deleted

## 2011-06-21 ENCOUNTER — Other Ambulatory Visit (INDEPENDENT_AMBULATORY_CARE_PROVIDER_SITE_OTHER): Payer: Medicare Other | Admitting: *Deleted

## 2011-06-21 DIAGNOSIS — I5022 Chronic systolic (congestive) heart failure: Secondary | ICD-10-CM

## 2011-06-21 LAB — BASIC METABOLIC PANEL
BUN: 10 mg/dL (ref 6–23)
Creatinine, Ser: 0.7 mg/dL (ref 0.4–1.2)
GFR: 115.78 mL/min (ref >60.00)

## 2011-06-29 ENCOUNTER — Other Ambulatory Visit: Payer: Self-pay | Admitting: *Deleted

## 2011-06-29 DIAGNOSIS — I5022 Chronic systolic (congestive) heart failure: Secondary | ICD-10-CM

## 2011-06-29 MED ORDER — TORSEMIDE 20 MG PO TABS: 80.0000 mg | ORAL_TABLET | Freq: Every day | ORAL | Status: DC

## 2011-07-10 ENCOUNTER — Emergency Department (HOSPITAL_COMMUNITY): Payer: Medicare Other

## 2011-07-10 ENCOUNTER — Emergency Department (HOSPITAL_COMMUNITY)
Admission: EM | Admit: 2011-07-10 | Discharge: 2011-07-10 | Disposition: A | Discharge status: Home / Self Care | Payer: Medicare Other | Attending: Emergency Medicine | Admitting: Emergency Medicine

## 2011-07-10 ENCOUNTER — Encounter (HOSPITAL_COMMUNITY): Payer: Self-pay | Admitting: Emergency Medicine

## 2011-07-10 DIAGNOSIS — K219 Gastro-esophageal reflux disease without esophagitis: Secondary | ICD-10-CM | POA: Insufficient documentation

## 2011-07-10 DIAGNOSIS — I509 Heart failure, unspecified: Secondary | ICD-10-CM | POA: Insufficient documentation

## 2011-07-10 DIAGNOSIS — G4733 Obstructive sleep apnea (adult) (pediatric): Secondary | ICD-10-CM | POA: Insufficient documentation

## 2011-07-10 DIAGNOSIS — M1990 Unspecified osteoarthritis, unspecified site: Secondary | ICD-10-CM

## 2011-07-10 DIAGNOSIS — M1710 Unilateral primary osteoarthritis, unspecified knee: Secondary | ICD-10-CM | POA: Insufficient documentation

## 2011-07-10 DIAGNOSIS — I10 Essential (primary) hypertension: Secondary | ICD-10-CM | POA: Insufficient documentation

## 2011-07-10 DIAGNOSIS — M069 Rheumatoid arthritis, unspecified: Secondary | ICD-10-CM | POA: Insufficient documentation

## 2011-07-10 DIAGNOSIS — M25569 Pain in unspecified knee: Secondary | ICD-10-CM | POA: Insufficient documentation

## 2011-07-10 HISTORY — DX: Dorsalgia, unspecified: M54.9

## 2011-07-10 HISTORY — DX: Other chronic pain: G89.29

## 2011-07-10 HISTORY — DX: Pain in unspecified knee: M25.569

## 2011-07-10 MED ORDER — NAPROXEN 500 MG PO TABS: 500.0000 mg | ORAL_TABLET | Freq: Two times a day (BID) | ORAL | Status: DC

## 2011-07-10 MED ORDER — IBUPROFEN 800 MG PO TABS
800.0000 mg | ORAL_TABLET | Freq: Once | ORAL | Status: AC
  Administered 2011-07-10: 800 mg via ORAL
  Filled 2011-07-10: qty 1

## 2011-07-10 MED ORDER — OXYCODONE-ACETAMINOPHEN 5-325 MG PO TABS
2.0000 | ORAL_TABLET | Freq: Once | ORAL | Status: AC
  Administered 2011-07-10: 2 via ORAL
  Filled 2011-07-10: qty 2

## 2011-07-10 MED ORDER — OXYCODONE-ACETAMINOPHEN 5-325 MG PO TABS: 1.0000 | ORAL_TABLET | Freq: Four times a day (QID) | ORAL | Status: AC | PRN

## 2011-07-10 MED ORDER — HYDROXYZINE HCL 25 MG PO TABS: 50.0000 mg | ORAL_TABLET | Freq: Four times a day (QID) | ORAL | Status: DC

## 2011-07-10 NOTE — Discharge Instructions (Signed)
Arthritis, Nonspecific  Arthritis is inflammation of a joint. This usually means pain, redness, warmth or swelling are present. One or more joints may be involved. There are a number of types of arthritis. Your caregiver may not be able to tell what type of arthritis you have right away.  CAUSES   The most common cause of arthritis is the wear and tear on the joint (osteoarthritis). This causes damage to the cartilage, which can break down over time. The knees, hips, back and neck are most often affected by this type of arthritis.  Other types of arthritis and common causes of joint pain include:  · Sprains and other injuries near the joint. Sometimes minor sprains and injuries cause pain and swelling that develop hours later.   · Rheumatoid arthritis. This affects hands, feet and knees. It usually affects both sides of your body at the same time. It is often associated with chronic ailments, fever, weight loss and general weakness.   · Crystal arthritis. Gout and pseudo gout can cause occasional acute severe pain, redness and swelling in the foot, ankle, or knee.   · Infectious arthritis. Bacteria can get into a joint through a break in overlying skin. This can cause infection of the joint. Bacteria and viruses can also spread through the blood and affect your joints.   · Drug, infectious and allergy reactions. Sometimes joints can become mildly painful and slightly swollen with these types of illnesses.   SYMPTOMS   · Pain is the main symptom.   · Your joint or joints can also be red, swollen and warm or hot to the touch.   · You may have a fever with certain types of arthritis, or even feel overall ill.   · The joint with arthritis will hurt with movement. Stiffness is present with some types of arthritis.   DIAGNOSIS   Your caregiver will suspect arthritis based on your description of your symptoms and on your exam. Testing may be needed to find the type of arthritis:  · Blood and sometimes urine tests.    · X-ray tests and sometimes CT or MRI scans.   · Removal of fluid from the joint (arthrocentesis) is done to check for bacteria, crystals or other causes. Your caregiver (or a specialist) will numb the area over the joint with a local anesthetic, and use a needle to remove joint fluid for examination. This procedure is only minimally uncomfortable.   · Even with these tests, your caregiver may not be able to tell what kind of arthritis you have. Consultation with a specialist (rheumatologist) may be helpful.   TREATMENT   Your caregiver will discuss with you treatment specific to your type of arthritis. If the specific type cannot be determined, then the following general recommendations may apply.  Treatment of severe joint pain includes:  · Rest.   · Elevation.   · Anti-inflammatory medication (for example, ibuprofen) may be prescribed. Avoiding activities that cause increased pain.   · Only take over-the-counter or prescription medicines for pain and discomfort as recommended by your caregiver.   · Cold packs over an inflamed joint may be used for 10 to 15 minutes every hour. Hot packs sometimes feel better, but do not use overnight. Do not use hot packs if you are diabetic without your caregiver's permission.   · A cortisone shot into arthritic joints may help reduce pain and swelling.   · Any acute arthritis that gets worse over the next 1 to 2   days needs to be looked at to be sure there is no joint infection.   Long-term arthritis treatment involves modifying activities and lifestyle to reduce joint stress jarring. This can include weight loss. Also, exercise is needed to nourish the joint cartilage and remove waste. This helps keep the muscles around the joint strong.  HOME CARE INSTRUCTIONS   · Do not take aspirin to relieve pain if gout is suspected. This elevates uric acid levels.   · Only take over-the-counter or prescription medicines for pain, discomfort or fever as directed by your caregiver.    · Rest the joint as much as possible.   · If your joint is swollen, keep it elevated.   · Use crutches if the painful joint is in your leg.   · Drinking plenty of fluids may help for certain types of arthritis.   · Follow your caregiver's dietary instructions.   · Try low-impact exercise such as:   · Swimming.   · Water aerobics.   · Biking.   · Walking.   · Morning stiffness is often relieved by a warm shower.   · Put your joints through regular range-of-motion.   SEEK MEDICAL CARE IF:   · You do not feel better in 24 hours or are getting worse.   · You have side effects to medications, or are not getting better with treatment.   SEEK IMMEDIATE MEDICAL CARE IF:   · You have a fever.   · You develop severe joint pain, swelling or redness.   · Many joints are involved and become painful and swollen.   · There is severe back pain and/or leg weakness.   · You have loss of bowel or bladder control.   Document Released: 06/09/2004 Document Revised: 01/12/2011 Document Reviewed: 06/25/2008  [employer]® Patient Information ©2012 [employer].

## 2011-07-10 NOTE — ED Notes (Signed)
PT reports a HX of leg pain ,back pain and knee pain.  PT also reports falling last week

## 2011-07-10 NOTE — ED Notes (Signed)
Pt reports history of chronic back, knee, and leg pain. Pt has little cartilage in her left knee, but can't have surgery because of her weight. Pt also reports unable to sleep at night. Pt seen by [facility] and psych Dr but no change.

## 2011-07-10 NOTE — Progress Notes (Signed)
Orthopedic Tech Progress Note Patient Details:  [name] 28-Mar-1961 [id number]  Other Ortho Devices Type of Ortho Device: Knee Immobilizer Ortho Device Interventions: Application   Cammer, Mickie Bail 07/10/2011, 2:47 PM

## 2011-07-10 NOTE — ED Provider Notes (Signed)
History     CSN: [id number]  Arrival date & time 07/10/11  1132   First MD Initiated Contact with Patient 07/10/11 1205      Chief Complaint  Patient presents with  . Leg Pain    kne pain and back pain    (Consider location/radiation/quality/duration/timing/severity/associated sxs/prior treatment) HPI Comments: States fell last week with no significant injury.  Difficulty with ambulation due to pain.  Also states that she has not slept a single minute in the past 10 months and is on seroquel for sleep  Patient is a 51 y.o. female presenting with knee pain. The history is provided by the patient. No language interpreter was used.  Knee Pain This is a chronic problem. The current episode started more than 2 days ago. The problem occurs constantly. The problem has been gradually worsening. Pertinent negatives include no chest pain, no abdominal pain, no headaches and no shortness of breath. The symptoms are aggravated by walking, bending and standing. The symptoms are relieved by nothing. She has tried nothing for the symptoms.    Past Medical History  Diagnosis Date  . Severe mitral regurgitation     Minimally invasive vale repair on 12/18/08. Additionally TV repair  and PFO closure.  . CHF (congestive heart failure)     TEE(6/10): EF 40%  . Microcytic anemia     EGD and colonoscopy in 12/11 did not reveal source of bleeding.Plan capsule endoscopy.  . Gastric ulcer   . Morbid obesity   . Obstructive sleep apnea     on CPAP  . GERD (gastroesophageal reflux disease)   . HTN (hypertension)   . Rheumatoid arthritis   . Gout   . Depression   . Colitis 2/11  . DVT (deep venous thrombosis)     right leg  . SOB (shortness of breath)   . Chronic back pain   . Chronic knee pain     Past Surgical History  Procedure Date  . Cardiac valve surgery     No family history on file.  History  Substance Use Topics  . Smoking status: Former Smoker -- 1.0 packs/day    Types:  Cigarettes    Quit date: 11/17/2008  . Smokeless tobacco: Former Neurosurgeon  . Alcohol Use: No    OB History    Grav Para Term Preterm Abortions TAB SAB Ect Mult Living                  Review of Systems  Constitutional: Negative for fever, activity change, appetite change and fatigue.  HENT: Negative for congestion, sore throat, rhinorrhea, neck pain and neck stiffness.   Respiratory: Negative for cough and shortness of breath.   Cardiovascular: Negative for chest pain and palpitations.  Gastrointestinal: Negative for nausea, vomiting and abdominal pain.  Genitourinary: Negative for dysuria, urgency, frequency and flank pain.  Musculoskeletal: Positive for arthralgias. Negative for myalgias and back pain.  Neurological: Negative for dizziness, weakness, light-headedness, numbness and headaches.  All other systems reviewed and are negative.    Allergies  Colchicine; Morphine; and Risperdal  Home Medications   Current Outpatient Rx  Name Route Sig Dispense Refill  . ASPIRIN EC 81 MG PO TBEC Oral Take 81 mg by mouth daily.    Marland Kitchen CARVEDILOL 12.5 MG PO TABS Oral Take 12.5 mg by mouth 2 (two) times daily.    Marland Kitchen ESOMEPRAZOLE MAGNESIUM 40 MG PO CPDR Oral Take 40 mg by mouth daily before breakfast.      .  LOSARTAN POTASSIUM 50 MG PO TABS Oral Take 50 mg by mouth daily.    . QUETIAPINE FUMARATE 100 MG PO TABS Oral Take 300 mg by mouth at bedtime.    . TORSEMIDE 20 MG PO TABS Oral Take 80 mg by mouth daily.    Marland Kitchen HYDROXYZINE HCL 25 MG PO TABS Oral Take 2 tablets (50 mg total) by mouth every 6 (six) hours. 12 tablet 0  . NAPROXEN 500 MG PO TABS Oral Take 1 tablet (500 mg total) by mouth 2 (two) times daily. 30 tablet 0  . OXYCODONE-ACETAMINOPHEN 5-325 MG PO TABS Oral Take 1-2 tablets by mouth every 6 (six) hours as needed for pain. 20 tablet 0    BP 159/89  Pulse 105  Temp(Src) 98.2 F (36.8 C) (Oral)  Resp 18  SpO2 100%  Physical Exam  Nursing note and vitals  reviewed. Constitutional: She is oriented to person, place, and time. She appears well-developed and well-nourished. No distress.  HENT:  Head: Normocephalic and atraumatic.  Mouth/Throat: Oropharynx is clear and moist. No oropharyngeal exudate.  Eyes: Conjunctivae and EOM are normal. Pupils are equal, round, and reactive to light.  Neck: Normal range of motion. Neck supple.  Cardiovascular: Normal rate, regular rhythm, normal heart sounds and intact distal pulses.  Exam reveals no gallop and no friction rub.   No murmur heard. Pulmonary/Chest: Effort normal. No respiratory distress. She exhibits no tenderness.  Abdominal: Soft. Bowel sounds are normal. There is no tenderness.  Musculoskeletal:       Right knee: She exhibits decreased range of motion and bony tenderness. She exhibits no deformity. tenderness found. No medial joint line and no lateral joint line tenderness noted.       Left knee: She exhibits decreased range of motion and bony tenderness. She exhibits no swelling, no effusion and no deformity. tenderness found. Medial joint line and lateral joint line tenderness noted.  Neurological: She is alert and oriented to person, place, and time. She has normal strength. No cranial nerve deficit or sensory deficit.  Skin: Skin is warm and dry. No rash noted.    ED Course  Procedures (including critical care time)  Labs Reviewed - No data to display Dg Knee Complete 4 Views Left  07/10/2011  *RADIOLOGY REPORT*  Clinical Data: Bilateral knee pain.  LEFT KNEE - COMPLETE 4+ VIEW 07/10/2011:  Comparison: Left knee x-rays 03/17/2009 [facility].  Findings: Mild to moderate medial compartment joint space narrowing and associated mild hypertrophic spurring, unchanged.  Lateral and patellofemoral compartment joint spaces well preserved.  No evidence of acute fracture or dislocation.  Large joint effusion.  IMPRESSION: No acute osseous abnormality.  Mild medial compartment osteoarthritis.   Large joint effusion.  Original Report Authenticated By: Arnell Sieving, M.D.   Dg Knee Complete 4 Views Right  07/10/2011  *RADIOLOGY REPORT*  Clinical Data: Bilateral knee pain.  RIGHT KNEE - COMPLETE 4+ VIEW 07/10/2011:  Comparison: Right knee x-rays 03/17/2009 [facility].  Findings: Tricompartment joint space narrowing and associated hypertrophic spurring.  No evidence of acute fracture or dislocation.  No visible joint effusion.  Focal area of sclerosis in the lateral tibial plateau on the prior examination is less well seen currently.  IMPRESSION: No acute osseous abnormality.  Osteoarthritis.  Original Report Authenticated By: Arnell Sieving, M.D.     1. Arthritis       MDM  Knee pain secondary to chronic arthritis. I attempted an arthrocentesis secondary to joint effusion however  was unsuccessful as the patient's knee was too large and I could not adequately assess the anatomy. She will likely require fluoroscopy guided arthrocentesis. She will be instructed to followup with orthopedics in one week. Will be provided pain medication anti-inflammatory medication. Placed in a knee immobilizer.        Dayton Bailiff, MD 07/10/11 [phone]

## 2011-07-11 ENCOUNTER — Ambulatory Visit (HOSPITAL_COMMUNITY)
Admission: RE | Admit: 2011-07-11 | Discharge: 2011-07-11 | Disposition: A | Discharge status: Home / Self Care | Payer: Medicaid Other | Source: Ambulatory Visit | Attending: Internal Medicine | Admitting: Internal Medicine

## 2011-07-11 VITALS — BP 112/80 | HR 130 | Wt 349.0 lb

## 2011-07-11 DIAGNOSIS — I5022 Chronic systolic (congestive) heart failure: Secondary | ICD-10-CM

## 2011-07-11 MED ORDER — METOLAZONE 2.5 MG PO TABS: 2.5000 mg | ORAL_TABLET | ORAL | Status: DC | PRN

## 2011-07-11 NOTE — Assessment & Plan Note (Addendum)
NYHA IIIB. Volume status elevated. Weight trending up. She is not complaint with medications, low salt diet, or daily weights. Instructed to take Metolazone 2.5 mg daily for 3 days and increase Torsemide to 80 mg twice a day for 3 days. Lengthy discussion about low salt diet, medication compliance, daily weights, and fluid restriction. Provided pill boxes and weight chart.  Provided with digital scale . Will not start digoxin at this time due to compliance issues. Follow up on Thursday to re-evaluate and check BMET.   Patient seen and examined with Tonye Becket, NP. We discussed all aspects of the encounter. I agree with the assessment and plan as stated above. She is significantly volume overloaded on exam  in setting with medication and dietary noncompliance. BP and O2 saturations are stable. Will have her double up demadex and add metolazone for 3 days. Return to clinic in 3 days. Call sooner as needed. Extensive HF education provided.

## 2011-07-11 NOTE — Progress Notes (Signed)
Patient ID: [name], female   DOB: Apr 25, 1961, 51 y.o.   MRN: [id number] HPI:  51 yo with systolic CHF probably secondary to severe MR now s/p MV repair, rheumatoid arthritis, and PUD presents for followup. Last echo showed EF 30-35%. She has been diagnosed with schizophrenia or schizoaffective disorder and sees a psychiatrist.   TEE (6/10): EF 40%, diffuse hypokinesis, mild to moderately dilated LV, severe eccentric MR, small PFO.  Cardiac MRI (6/10): EF 37% with global hypokinesis, moderate to severe MR, no delayed enhancement (no evidence for sarcoidosis or other infiltrative disease.  TTE (10/10) after MV repair: EF 25-30%, moderately dilated LV, moderate diastolic dysfunction, mildly depressed RV function, trivial MR, MV mean gradient 5 mmHg, PASP 30 mmHg.  RHC (12/10): mean RA 19, PA 46/26, mean PCWP 28, CI 2.5, SVO2 63%.  TTE (2/11): EF 35-40% with diffuse hypokinesis, no significant MR or MS.  TTE (7/11): EF 45%, mild global hypokinesis, s/p MV repair, no mitral regurgitation, minimal mitral stenosis, PA systolic pressure 40 mmHg, mild LV dilation.  TTE (11/12): EF 30-35%, mild LV dilation, mild LVH, s/p MV repair with no regurgitation and minimal stenosis  She is here for follow up per Dr Kathlyn Sacramento request. Last office visit. Dr Shirlee Latch ordered Bidil TID but she is not taking it. She is only taking Carvedilol 6.25 mg twice a day. Complains of dyspnea on exertion +PND +orhopnea. Sleeps on 2 pillows. Feel last week and complains of L knee pain. She is not weighing at home. Weight at home 337.  Lower extremity swelling. She has CPAP but it is currently in storage.   Lives with her daughter. She is able to obtain medications but occasionally gets dosage wrong. Not compliant with low salt diet.     ROS: All systems negative except as listed in HPI, PMH and Problem List.  Past Medical History  Diagnosis Date  . Severe mitral regurgitation     Minimally invasive vale repair on 12/18/08.  Additionally TV repair  and PFO closure.  . CHF (congestive heart failure)     TEE(6/10): EF 40%  . Microcytic anemia     EGD and colonoscopy in 12/11 did not reveal source of bleeding.Plan capsule endoscopy.  . Gastric ulcer   . Morbid obesity   . Obstructive sleep apnea     on CPAP  . GERD (gastroesophageal reflux disease)   . HTN (hypertension)   . Rheumatoid arthritis   . Gout   . Depression   . Colitis 2/11  . DVT (deep venous thrombosis)     right leg  . SOB (shortness of breath)   . Chronic back pain   . Chronic knee pain     Current Outpatient Prescriptions  Medication Sig Dispense Refill  . aspirin EC 81 MG tablet Take 81 mg by mouth daily.      . carvedilol (COREG) 6.25 MG tablet Take 6.25 mg by mouth 2 (two) times daily with a meal.      . esomeprazole (NEXIUM) 40 MG capsule Take 40 mg by mouth daily before breakfast.        . losartan (COZAAR) 50 MG tablet Take 50 mg by mouth daily.      . naproxen (NAPROSYN) 500 MG tablet Take 1 tablet (500 mg total) by mouth 2 (two) times daily.  30 tablet  0  . oxyCODONE-acetaminophen (PERCOCET) 5-325 MG per tablet Take 1-2 tablets by mouth every 6 (six) hours as needed for pain.  20 tablet  0  . QUEtiapine (SEROQUEL) 100 MG tablet Take 400 mg by mouth at bedtime.       . torsemide (DEMADEX) 20 MG tablet Take 80 mg by mouth daily.      . carvedilol (COREG) 12.5 MG tablet Take 12.5 mg by mouth 2 (two) times daily.      . hydrOXYzine (ATARAX/VISTARIL) 25 MG tablet Take 2 tablets (50 mg total) by mouth every 6 (six) hours.  12 tablet  0     PHYSICAL EXAM: Filed Vitals:   07/11/11 1514  BP: 112/80  Pulse: 130  349 pounds Weight change:  General:  Ill  appearing. No resp difficulty HEENT: normal Neck: supple. JVP 10-11. Carotids 2+ bilaterally; no bruits. No lymphadenopathy or thryomegaly appreciated. Cor: PMI normal. Tachycardic Regular rate & rhythm. S3 No rubs, or murmurs. Lungs: clear Abdomen: obese, soft, nontender,  nondistended. No hepatosplenomegaly. No bruits or masses. Good bowel sounds. Extremities: no cyanosis, clubbing, rash, R and LLE 2+edema Neuro: alert & orientedx3, cranial nerves grossly intact. Moves all 4 extremities w/o difficulty. Affect pleasant.    ECG: Sinus Tachycardia   ASSESSMENT & PLAN:

## 2011-07-11 NOTE — Patient Instructions (Addendum)
Take Metolazone 2.5 mg for the next 3 days  Monday, Tuesday, Wednesday  Take Torsemide 80 mg twice a day for the next 3 days Tuesday, Wednesday, Thursday  Follow up Thursday   .Do the following things EVERYDAY: 1) Weigh yourself in the morning before breakfast. Write it down and keep it in a log. 2) Take your medicines as prescribed 3) Eat low salt foods--Limit salt (sodium) to 2000mg per day.  Stay as active as you can everyday  

## 2011-07-12 NOTE — Progress Notes (Signed)
Encounter addended by: Labrea Eccleston N Reice Bienvenue on: 07/12/2011  7:28 AM<BR>     Documentation filed: Charges VN

## 2011-07-14 ENCOUNTER — Encounter (HOSPITAL_COMMUNITY): Payer: Medicaid Other

## 2011-07-14 ENCOUNTER — Encounter (HOSPITAL_COMMUNITY): Payer: Self-pay | Admitting: General Practice

## 2011-07-14 ENCOUNTER — Encounter (HOSPITAL_COMMUNITY): Payer: Self-pay

## 2011-07-14 ENCOUNTER — Inpatient Hospital Stay (HOSPITAL_COMMUNITY)
Admission: AD | Admit: 2011-07-14 | Discharge: 2011-07-21 | DRG: 291 | Disposition: A | Discharge status: Home Health | Payer: Medicare Other | Source: Ambulatory Visit | Attending: Internal Medicine | Admitting: Internal Medicine

## 2011-07-14 ENCOUNTER — Ambulatory Visit (HOSPITAL_BASED_OUTPATIENT_CLINIC_OR_DEPARTMENT_OTHER)
Admission: RE | Admit: 2011-07-14 | Discharge: 2011-07-14 | Disposition: A | Discharge status: Home / Self Care | Payer: Medicare Other | Source: Ambulatory Visit | Attending: Internal Medicine | Admitting: Internal Medicine

## 2011-07-14 VITALS — BP 100/60 | HR 100 | Wt 354.8 lb

## 2011-07-14 DIAGNOSIS — D509 Iron deficiency anemia, unspecified: Secondary | ICD-10-CM | POA: Diagnosis present

## 2011-07-14 DIAGNOSIS — N189 Chronic kidney disease, unspecified: Secondary | ICD-10-CM | POA: Diagnosis present

## 2011-07-14 DIAGNOSIS — M549 Dorsalgia, unspecified: Secondary | ICD-10-CM | POA: Diagnosis present

## 2011-07-14 DIAGNOSIS — M109 Gout, unspecified: Secondary | ICD-10-CM | POA: Diagnosis present

## 2011-07-14 DIAGNOSIS — K219 Gastro-esophageal reflux disease without esophagitis: Secondary | ICD-10-CM | POA: Diagnosis present

## 2011-07-14 DIAGNOSIS — F329 Major depressive disorder, single episode, unspecified: Secondary | ICD-10-CM | POA: Diagnosis present

## 2011-07-14 DIAGNOSIS — Z6841 Body Mass Index (BMI) 40.0 and over, adult: Secondary | ICD-10-CM

## 2011-07-14 DIAGNOSIS — I5022 Chronic systolic (congestive) heart failure: Secondary | ICD-10-CM

## 2011-07-14 DIAGNOSIS — I5023 Acute on chronic systolic (congestive) heart failure: Principal | ICD-10-CM | POA: Diagnosis present

## 2011-07-14 DIAGNOSIS — Z7982 Long term (current) use of aspirin: Secondary | ICD-10-CM

## 2011-07-14 DIAGNOSIS — M1710 Unilateral primary osteoarthritis, unspecified knee: Secondary | ICD-10-CM | POA: Diagnosis present

## 2011-07-14 DIAGNOSIS — R0602 Shortness of breath: Secondary | ICD-10-CM

## 2011-07-14 DIAGNOSIS — Z86718 Personal history of other venous thrombosis and embolism: Secondary | ICD-10-CM

## 2011-07-14 DIAGNOSIS — I129 Hypertensive chronic kidney disease with stage 1 through stage 4 chronic kidney disease, or unspecified chronic kidney disease: Secondary | ICD-10-CM | POA: Diagnosis present

## 2011-07-14 DIAGNOSIS — F209 Schizophrenia, unspecified: Secondary | ICD-10-CM | POA: Diagnosis present

## 2011-07-14 DIAGNOSIS — G8929 Other chronic pain: Secondary | ICD-10-CM | POA: Diagnosis present

## 2011-07-14 DIAGNOSIS — M25561 Pain in right knee: Secondary | ICD-10-CM

## 2011-07-14 DIAGNOSIS — Z8711 Personal history of peptic ulcer disease: Secondary | ICD-10-CM

## 2011-07-14 DIAGNOSIS — Z9119 Patient's noncompliance with other medical treatment and regimen: Secondary | ICD-10-CM

## 2011-07-14 DIAGNOSIS — I5021 Acute systolic (congestive) heart failure: Secondary | ICD-10-CM

## 2011-07-14 DIAGNOSIS — M25562 Pain in left knee: Secondary | ICD-10-CM

## 2011-07-14 DIAGNOSIS — E669 Obesity, unspecified: Secondary | ICD-10-CM | POA: Diagnosis present

## 2011-07-14 DIAGNOSIS — Z79899 Other long term (current) drug therapy: Secondary | ICD-10-CM

## 2011-07-14 DIAGNOSIS — N179 Acute kidney failure, unspecified: Secondary | ICD-10-CM | POA: Diagnosis not present

## 2011-07-14 DIAGNOSIS — J9620 Acute and chronic respiratory failure, unspecified whether with hypoxia or hypercapnia: Secondary | ICD-10-CM | POA: Diagnosis present

## 2011-07-14 DIAGNOSIS — R0989 Other specified symptoms and signs involving the circulatory and respiratory systems: Secondary | ICD-10-CM

## 2011-07-14 DIAGNOSIS — M069 Rheumatoid arthritis, unspecified: Secondary | ICD-10-CM | POA: Diagnosis present

## 2011-07-14 DIAGNOSIS — Z91199 Patient's noncompliance with other medical treatment and regimen due to unspecified reason: Secondary | ICD-10-CM

## 2011-07-14 DIAGNOSIS — G4733 Obstructive sleep apnea (adult) (pediatric): Secondary | ICD-10-CM | POA: Diagnosis present

## 2011-07-14 DIAGNOSIS — I428 Other cardiomyopathies: Secondary | ICD-10-CM | POA: Diagnosis present

## 2011-07-14 DIAGNOSIS — I517 Cardiomegaly: Secondary | ICD-10-CM | POA: Diagnosis present

## 2011-07-14 DIAGNOSIS — F3289 Other specified depressive episodes: Secondary | ICD-10-CM | POA: Diagnosis present

## 2011-07-14 HISTORY — DX: Anxiety disorder, unspecified: F41.9

## 2011-07-14 HISTORY — DX: Sleep disorder, unspecified: G47.9

## 2011-07-14 LAB — COMPREHENSIVE METABOLIC PANEL
AST: 28 U/L (ref 0–37)
Albumin: 2.9 g/dL — ABNORMAL LOW (ref 3.5–5.2)
Calcium: 8.7 mg/dL (ref 8.4–10.5)
Chloride: 99 mEq/L (ref 96–112)
Creatinine, Ser: 1.25 mg/dL — ABNORMAL HIGH (ref 0.50–1.10)
Sodium: 137 mEq/L (ref 135–145)

## 2011-07-14 LAB — CBC
HCT: 32.2 % — ABNORMAL LOW (ref 36.0–46.0)
Hemoglobin: 10.1 g/dL — ABNORMAL LOW (ref 12.0–15.0)
RBC: 4.42 MIL/uL (ref 3.87–5.11)
WBC: 8.5 10*3/uL (ref 4.0–10.5)

## 2011-07-14 LAB — PRO B NATRIURETIC PEPTIDE: Pro B Natriuretic peptide (BNP): 1324 pg/mL — ABNORMAL HIGH (ref 0–125)

## 2011-07-14 LAB — TSH: TSH: 0.509 u[IU]/mL (ref 0.350–4.500)

## 2011-07-14 MED ORDER — ONDANSETRON HCL 4 MG/2ML IJ SOLN: 4.0000 mg | Freq: Four times a day (QID) | INTRAMUSCULAR | Status: DC | PRN

## 2011-07-14 MED ORDER — TRAMADOL HCL 50 MG PO TABS
50.0000 mg | ORAL_TABLET | Freq: Three times a day (TID) | ORAL | Status: DC | PRN
  Administered 2011-07-14 – 2011-07-18 (×6): 50 mg via ORAL
  Filled 2011-07-14 (×8): qty 1

## 2011-07-14 MED ORDER — SODIUM CHLORIDE 0.9 % IV SOLN: 250.0000 mL | INTRAVENOUS | Status: DC | PRN

## 2011-07-14 MED ORDER — SODIUM CHLORIDE 0.9 % IJ SOLN: 3.0000 mL | INTRAMUSCULAR | Status: DC | PRN

## 2011-07-14 MED ORDER — LOSARTAN POTASSIUM 50 MG PO TABS
50.0000 mg | ORAL_TABLET | Freq: Every day | ORAL | Status: DC
  Administered 2011-07-14 – 2011-07-21 (×8): 50 mg via ORAL
  Filled 2011-07-14 (×8): qty 1

## 2011-07-14 MED ORDER — SODIUM CHLORIDE 0.9 % IJ SOLN
3.0000 mL | Freq: Two times a day (BID) | INTRAMUSCULAR | Status: DC
  Administered 2011-07-14 – 2011-07-21 (×14): 3 mL via INTRAVENOUS

## 2011-07-14 MED ORDER — OXYCODONE-ACETAMINOPHEN 5-325 MG PO TABS
1.0000 | ORAL_TABLET | Freq: Four times a day (QID) | ORAL | Status: DC | PRN
  Administered 2011-07-14 – 2011-07-15 (×4): 2 via ORAL
  Administered 2011-07-15: 1 via ORAL
  Administered 2011-07-16 – 2011-07-17 (×6): 2 via ORAL
  Administered 2011-07-18: 1 via ORAL
  Administered 2011-07-18 – 2011-07-20 (×5): 2 via ORAL
  Administered 2011-07-20: 1 via ORAL
  Administered 2011-07-21: 2 via ORAL
  Filled 2011-07-14 (×20): qty 2

## 2011-07-14 MED ORDER — CARVEDILOL 3.125 MG PO TABS
3.1250 mg | ORAL_TABLET | Freq: Two times a day (BID) | ORAL | Status: DC
  Administered 2011-07-14 – 2011-07-19 (×10): 3.125 mg via ORAL
  Filled 2011-07-14 (×12): qty 1

## 2011-07-14 MED ORDER — ENOXAPARIN SODIUM 40 MG/0.4ML ~~LOC~~ SOLN
40.0000 mg | SUBCUTANEOUS | Status: DC
  Administered 2011-07-14 – 2011-07-20 (×7): 40 mg via SUBCUTANEOUS
  Filled 2011-07-14 (×8): qty 0.4

## 2011-07-14 MED ORDER — PANTOPRAZOLE SODIUM 40 MG PO TBEC
40.0000 mg | DELAYED_RELEASE_TABLET | Freq: Every day | ORAL | Status: DC
  Administered 2011-07-14 – 2011-07-21 (×8): 40 mg via ORAL
  Filled 2011-07-14 (×6): qty 1

## 2011-07-14 MED ORDER — ASPIRIN EC 81 MG PO TBEC
81.0000 mg | DELAYED_RELEASE_TABLET | Freq: Every day | ORAL | Status: DC
  Administered 2011-07-15 – 2011-07-21 (×7): 81 mg via ORAL
  Filled 2011-07-14 (×7): qty 1

## 2011-07-14 MED ORDER — ACETAMINOPHEN 325 MG PO TABS
650.0000 mg | ORAL_TABLET | ORAL | Status: DC | PRN
Start: 2011-07-14 — End: 2011-07-21
  Administered 2011-07-15: 650 mg via ORAL
  Filled 2011-07-14: qty 2

## 2011-07-14 MED ORDER — POTASSIUM CHLORIDE CRYS ER 20 MEQ PO TBCR
40.0000 meq | EXTENDED_RELEASE_TABLET | Freq: Two times a day (BID) | ORAL | Status: DC
  Administered 2011-07-14 – 2011-07-19 (×11): 40 meq via ORAL
  Filled 2011-07-14 (×13): qty 2

## 2011-07-14 MED ORDER — FUROSEMIDE 10 MG/ML IJ SOLN
80.0000 mg | Freq: Three times a day (TID) | INTRAMUSCULAR | Status: DC
  Administered 2011-07-14 – 2011-07-16 (×6): 80 mg via INTRAVENOUS
  Filled 2011-07-14 (×9): qty 8

## 2011-07-14 MED ORDER — QUETIAPINE FUMARATE ER 400 MG PO TB24
400.0000 mg | ORAL_TABLET | Freq: Every day | ORAL | Status: DC
  Administered 2011-07-14 – 2011-07-20 (×7): 400 mg via ORAL
  Filled 2011-07-14 (×8): qty 1

## 2011-07-14 NOTE — Patient Instructions (Signed)
Take Metolazone 2.5 mg for the next 3 days  Monday, Tuesday, Wednesday  Take Torsemide 80 mg twice a day for the next 3 days Tuesday, Wednesday, Thursday  Follow up Thursday   .Do the following things EVERYDAY: 1) Weigh yourself in the morning before breakfast. Write it down and keep it in a log. 2) Take your medicines as prescribed 3) Eat low salt foods--Limit salt (sodium) to 2000mg  per day.  Stay as active as you can everyday

## 2011-07-14 NOTE — Progress Notes (Signed)
Patient ID: [name], female   DOB: 05/17/1960, 51 y.o.   MRN: [id number] Patient ID: [name], female   DOB: May 23, 1960, 51 y.o.   MRN: [id number] HPI:  51 yo with systolic CHF probably secondary to severe MR now s/p MV repair, rheumatoid arthritis, and PUD presents for followup. Last echo showed EF 30-35%. She has been diagnosed with schizophrenia or schizoaffective disorder and sees a psychiatrist.   TEE (6/10): EF 40%, diffuse hypokinesis, mild to moderately dilated LV, severe eccentric MR, small PFO.  Cardiac MRI (6/10): EF 37% with global hypokinesis, moderate to severe MR, no delayed enhancement (no evidence for sarcoidosis or other infiltrative disease.  TTE (10/10) after MV repair: EF 25-30%, moderately dilated LV, moderate diastolic dysfunction, mildly depressed RV function, trivial MR, MV mean gradient 5 mmHg, PASP 30 mmHg.  RHC (12/10): mean RA 19, PA 46/26, mean PCWP 28, CI 2.5, SVO2 63%.  TTE (2/11): EF 35-40% with diffuse hypokinesis, no significant MR or MS.  TTE (7/11): EF 45%, mild global hypokinesis, s/p MV repair, no mitral regurgitation, minimal mitral stenosis, PA systolic pressure 40 mmHg, mild LV dilation.  TTE (11/12): EF 30-35%, mild LV dilation, mild LVH, s/p MV repair with no regurgitation and minimal stenosis  She is here for follow up.Marland Kitchen She was seen Monday 2/25 an instructed to take Metolazone 2.5 mg daily for 3 days. She did not take Torsemide 80 mg BID as instructed.  She is not feeling any better after Metolazone. Complains of dyspnea on exertion +PND +orhopnea. Sleeps on 2 pillows. Feel last week and complains of L knee pain. She is not weighing at home.   Lower extremity swelling. She is no using CPAP because it is in storage.   Lives with her daughter. She is able to obtain medications but occasionally gets dosage wrong. Not compliant with low salt diet.  Drinking lots of soda.>2 liters per day.    ROS: All systems negative except as listed in HPI,  PMH and Problem List.  Past Medical History  Diagnosis Date  . Severe mitral regurgitation     Minimally invasive vale repair on 12/18/08. Additionally TV repair  and PFO closure.  . CHF (congestive heart failure)     TEE(6/10): EF 40%  . Microcytic anemia     EGD and colonoscopy in 12/11 did not reveal source of bleeding.Plan capsule endoscopy.  . Gastric ulcer   . Morbid obesity   . Obstructive sleep apnea     on CPAP  . GERD (gastroesophageal reflux disease)   . HTN (hypertension)   . Rheumatoid arthritis   . Gout   . Depression   . Colitis 2/11  . DVT (deep venous thrombosis)     right leg  . SOB (shortness of breath)   . Chronic back pain   . Chronic knee pain     Current Outpatient Prescriptions  Medication Sig Dispense Refill  . carvedilol (COREG) 12.5 MG tablet Take 12.5 mg by mouth 2 (two) times daily.      Marland Kitchen esomeprazole (NEXIUM) 40 MG capsule Take 40 mg by mouth daily before breakfast.        . hydrOXYzine (ATARAX/VISTARIL) 25 MG tablet Take 2 tablets (50 mg total) by mouth every 6 (six) hours.  12 tablet  0  . losartan (COZAAR) 50 MG tablet Take 50 mg by mouth daily.      . metolazone (ZAROXOLYN) 2.5 MG tablet Take 1 tablet (2.5 mg total) by mouth as needed (  As instructed by your doctor).  10 tablet  6  . naproxen (NAPROSYN) 500 MG tablet Take 1 tablet (500 mg total) by mouth 2 (two) times daily.  30 tablet  0  . oxyCODONE-acetaminophen (PERCOCET) 5-325 MG per tablet Take 1-2 tablets by mouth every 6 (six) hours as needed for pain.  20 tablet  0  . QUEtiapine (SEROQUEL) 100 MG tablet Take 400 mg by mouth at bedtime.       . torsemide (DEMADEX) 20 MG tablet Take 80 mg by mouth daily.      Marland Kitchen aspirin EC 81 MG tablet Take 81 mg by mouth daily.      Marland Kitchen DISCONTD: carvedilol (COREG) 6.25 MG tablet Take 6.25 mg by mouth 2 (two) times daily with a meal.         PHYSICAL EXAM: Filed Vitals:   07/14/11 1339  BP: 100/60  Pulse: 100  354 (349 pounds) Weight change:     Daughter present during exam  General:  Ill  appearing. No resp difficulty HEENT: normal Neck: supple. JVP 10-11. Carotids 2+ bilaterally; no bruits. No lymphadenopathy or thryomegaly appreciated. Cor: PMI normal. Regular rate & rhythm. S3 No rubs, or murmurs. Lungs: clear Abdomen: obese, soft, nontender, nondistended. No hepatosplenomegaly. No bruits or masses. Good bowel sounds. Extremities: no cyanosis, clubbing, rash, R and LLE 2+edema Neuro: alert & orientedx3, cranial nerves grossly intact. Moves all 4 extremities w/o difficulty. Affect pleasant.    ASSESSMENT & PLAN:

## 2011-07-14 NOTE — Progress Notes (Signed)
Patient ID: [name], female   DOB: October 16, 1960, 51 y.o.   MRN: [id number] HPI:  51 yo with systolic CHF probably secondary to severe MR now s/p MV repair, rheumatoid arthritis, and PUD presents for followup. Last echo showed EF 30-35%. She has been diagnosed with schizophrenia or schizoaffective disorder and sees a psychiatrist.   TEE (6/10): EF 40%, diffuse hypokinesis, mild to moderately dilated LV, severe eccentric MR, small PFO.  Cardiac MRI (6/10): EF 37% with global hypokinesis, moderate to severe MR, no delayed enhancement (no evidence for sarcoidosis or other infiltrative disease.  TTE (10/10) after MV repair: EF 25-30%, moderately dilated LV, moderate diastolic dysfunction, mildly depressed RV function, trivial MR, MV mean gradient 5 mmHg, PASP 30 mmHg.  RHC (12/10): mean RA 19, PA 46/26, mean PCWP 28, CI 2.5, SVO2 63%.  TTE (2/11): EF 35-40% with diffuse hypokinesis, no significant MR or MS.  TTE (7/11): EF 45%, mild global hypokinesis, s/p MV repair, no mitral regurgitation, minimal mitral stenosis, PA systolic pressure 40 mmHg, mild LV dilation.  TTE (11/12): EF 30-35%, mild LV dilation, mild LVH, s/p MV repair with no regurgitation and minimal stenosis  She is here for follow up.Marland Kitchen She was seen Monday 2/25 an instructed to take Metolazone 2.5 mg daily for 3 days. She did not take Torsemide 80 mg BID as instructed.  She is not feeling any better after Metolazone. Complains of dyspnea on exertion +PND +orhopnea. Sleeps on 2 pillows. Feel last week and complains of L knee pain. She is not weighing at home.   Lower extremity swelling. She is no using CPAP because it is in storage.   Lives with her daughter. She is able to obtain medications but occasionally gets dosage wrong. Not compliant with low salt diet.  Drinking lots of soda.>2 liters per day.    ROS: All systems negative except as listed in HPI, PMH and Problem List.  Past Medical History  Diagnosis Date  . Severe mitral  regurgitation     Minimally invasive vale repair on 12/18/08. Additionally TV repair  and PFO closure.  . CHF (congestive heart failure)     TEE(6/10): EF 40%  . Microcytic anemia     EGD and colonoscopy in 12/11 did not reveal source of bleeding.Plan capsule endoscopy.  . Gastric ulcer   . Morbid obesity   . Obstructive sleep apnea     on CPAP  . GERD (gastroesophageal reflux disease)   . HTN (hypertension)   . Rheumatoid arthritis   . Gout   . Depression   . Colitis 2/11  . DVT (deep venous thrombosis)     right leg  . SOB (shortness of breath)   . Chronic back pain   . Chronic knee pain     Current Outpatient Prescriptions  Medication Sig Dispense Refill  . aspirin EC 81 MG tablet Take 81 mg by mouth daily.      . carvedilol (COREG) 12.5 MG tablet Take 12.5 mg by mouth 2 (two) times daily.      . carvedilol (COREG) 6.25 MG tablet Take 6.25 mg by mouth 2 (two) times daily with a meal.      . esomeprazole (NEXIUM) 40 MG capsule Take 40 mg by mouth daily before breakfast.        . hydrOXYzine (ATARAX/VISTARIL) 25 MG tablet Take 2 tablets (50 mg total) by mouth every 6 (six) hours.  12 tablet  0  . losartan (COZAAR) 50 MG tablet Take 50 mg  by mouth daily.      . metolazone (ZAROXOLYN) 2.5 MG tablet Take 1 tablet (2.5 mg total) by mouth as needed (As instructed by your doctor).  10 tablet  6  . naproxen (NAPROSYN) 500 MG tablet Take 1 tablet (500 mg total) by mouth 2 (two) times daily.  30 tablet  0  . oxyCODONE-acetaminophen (PERCOCET) 5-325 MG per tablet Take 1-2 tablets by mouth every 6 (six) hours as needed for pain.  20 tablet  0  . QUEtiapine (SEROQUEL) 100 MG tablet Take 400 mg by mouth at bedtime.       . torsemide (DEMADEX) 20 MG tablet Take 80 mg by mouth daily.         PHYSICAL EXAM: Filed Vitals:   07/14/11 1339  BP: 100/60  Pulse: 100  354 (349 pounds) Weight change:   Daughter present during exam  General:  Ill  appearing. No resp difficulty HEENT:  normal Neck: supple. JVP 10-11. Carotids 2+ bilaterally; no bruits. No lymphadenopathy or thryomegaly appreciated. Cor: PMI normal. Regular rate & rhythm. S3 No rubs, or murmurs. Lungs: clear Abdomen: obese, soft, nontender, nondistended. No hepatosplenomegaly. No bruits or masses. Good bowel sounds. Extremities: no cyanosis, clubbing, rash, R and LLE 2+edema Neuro: alert & orientedx3, cranial nerves grossly intact. Moves all 4 extremities w/o difficulty. Affect pleasant.    ECG:    ASSESSMENT & PLAN:

## 2011-07-14 NOTE — Progress Notes (Signed)
Portable called for bariatric BSC for pt.

## 2011-07-14 NOTE — Assessment & Plan Note (Addendum)
NYHA IIIB. Remains SOB on exertion. Volume status remains elevated. Last visit instructed to take demadex 80 mg twice a day however she only took Demadex 80 mg daily. Weight continues go up . Will admit to 4700 and diurese.   Patient seen and examined with Tonye Becket, NP. We discussed all aspects of the encounter. I agree with the assessment and plan as stated above. She is failing outpatient management of her HF. There is marked fluid overload on exam. Weight up 20+ pounds. She is becoming increasingly symptomatic. Will admit for IV diuresis.

## 2011-07-14 NOTE — Progress Notes (Signed)
Pt was given percocet at 1651 for pain 10/10. Pt is still complaining of pain 10/10.  Dr. Gala Romney notified and ordered tramadol 50mg  q8. Will order and continue to monitor.

## 2011-07-14 NOTE — H&P (Signed)
HPI: 51 yo with systolic CHF probably secondary to severe MR now s/p MV repair, rheumatoid arthritis, and PUD presents for followup. Last echo showed EF 30-35%. She has been diagnosed with schizophrenia or schizoaffective disorder and sees a psychiatrist.  TEE (6/10): EF 40%, diffuse hypokinesis, mild to moderately dilated LV, severe eccentric MR, small PFO.  Cardiac MRI (6/10): EF 37% with global hypokinesis, moderate to severe MR, no delayed enhancement (no evidence for sarcoidosis or other infiltrative disease.  TTE (10/10) after MV repair: EF 25-30%, moderately dilated LV, moderate diastolic dysfunction, mildly depressed RV function, trivial MR, MV mean gradient 5 mmHg, PASP 30 mmHg.  RHC (12/10): mean RA 19, PA 46/26, mean PCWP 28, CI 2.5, SVO2 63%.  TTE (2/11): EF 35-40% with diffuse hypokinesis, no significant MR or MS.  TTE (7/11): EF 45%, mild global hypokinesis, s/p MV repair, no mitral regurgitation, minimal mitral stenosis, PA systolic pressure 40 mmHg, mild LV dilation.  TTE (11/12): EF 30-35%, mild LV dilation, mild LVH, s/p MV repair with no regurgitation and minimal stenosis   She is here for follow up.Marland Kitchen She was seen Monday 2/25 an instructed to take Metolazone 2.5 mg daily for 3 days. She did not take Torsemide 80 mg BID as instructed. She is not feeling any better after Metolazone. Complains of dyspnea on exertion +PND +orhopnea. Sleeps on 2 pillows. Feel last week and complains of L knee pain. She is not weighing at home. Lower extremity swelling. She is no using CPAP because it is in storage. Lives with her daughter. She is able to obtain medications but occasionally gets dosage wrong. Not compliant with low salt diet. Drinking lots of soda.>2 liters per day.       Review of Systems:     Cardiac Review of Systems: {Y] = yes [ ]  = no  Chest Pain [    ]  Resting SOB [ Y  ] Exertional SOB  [Y  ]  Orthopnea [  ]   Pedal Edema [ Y  ]    Palpitations [  ] Syncope  [  ]     Presyncope [   ]  General Review of Systems: [Y] = yes [  ]=no Constitional: recent weight change [ Y ]; anorexia [  ]; fatigue [  ]; nausea [  ]; night sweats [  ]; fever [  ]; or chills [  ];                                                                                                                                          Dental: poor dentition[  ]; Last Dentist visit:  Eye : blurred vision [  ]; diplopia [   ]; vision changes [  ];  Amaurosis fugax[  ]; Resp: cough [  ];  wheezing[  ];  hemoptysis[  ]; shortness of breath[ Y ];  paroxysmal nocturnal dyspnea[ Y ]; dyspnea on exertion[ Y ]; or orthopnea[ Y ];  GI:  gallstones[  ], vomiting[  ];  dysphagia[  ]; melena[  ];  hematochezia [  ]; heartburn[  ];   Hx of  Colonoscopy[  ]; GU: kidney stones [  ]; hematuria[  ];   dysuria [  ];  nocturia[  ];  history of     obstruction [  ];                 Skin: rash, swelling[  ];, hair loss[  ];  peripheral edema[ Y ];  or itching[  ]; Musculosketetal: myalgias[  ];  joint swelling[Y  ];  joint erythema[  ];  joint pain[  ];  back pain[  ];  Heme/Lymph: bruising[  ];  bleeding[  ];  anemia[  ];  Neuro: TIA[  ];  headaches[  ];  stroke[  ];  vertigo[  ];  seizures[  ];   paresthesias[  ];  difficulty walking[  ];  Psych:depression[ Y ]; anxiety[  ];  Endocrine: diabetes[  ];  thyroid dysfunction[Y  ];  Immunizations: Flu [  ]; Pneumococcal[  ];  Other:  Past Medical History  Diagnosis Date  . Severe mitral regurgitation     Minimally invasive vale repair on 12/18/08. Additionally TV repair  and PFO closure.  . CHF (congestive heart failure)     TEE(6/10): EF 40%  . Microcytic anemia     EGD and colonoscopy in 12/11 did not reveal source of bleeding.Plan capsule endoscopy.  . Gastric ulcer   . Morbid obesity   . Obstructive sleep apnea     on CPAP  . GERD (gastroesophageal reflux disease)   . HTN (hypertension)   . Rheumatoid arthritis   . Gout   . Depression   . Colitis 2/11  . DVT  (deep venous thrombosis)     right leg  . SOB (shortness of breath)   . Chronic back pain   . Chronic knee pain     Medications Prior to Admission  Medication Sig Dispense Refill  . carvedilol (COREG) 12.5 MG tablet Take 12.5 mg by mouth 2 (two) times daily.      Marland Kitchen esomeprazole (NEXIUM) 40 MG capsule Take 40 mg by mouth daily before breakfast.        . hydrOXYzine (ATARAX/VISTARIL) 25 MG tablet Take 2 tablets (50 mg total) by mouth every 6 (six) hours.  12 tablet  0  . losartan (COZAAR) 50 MG tablet Take 50 mg by mouth daily.      . metolazone (ZAROXOLYN) 2.5 MG tablet Take 1 tablet (2.5 mg total) by mouth as needed (As instructed by your doctor).  10 tablet  6  . naproxen (NAPROSYN) 500 MG tablet Take 1 tablet (500 mg total) by mouth 2 (two) times daily.  30 tablet  0  . oxyCODONE-acetaminophen (PERCOCET) 5-325 MG per tablet Take 1-2 tablets by mouth every 6 (six) hours as needed for pain.  20 tablet  0  . QUEtiapine (SEROQUEL) 100 MG tablet Take 400 mg by mouth at bedtime.       . torsemide (DEMADEX) 20 MG tablet Take 80 mg by mouth daily.      Marland Kitchen aspirin EC 81 MG tablet Take 81 mg by mouth daily.      Marland Kitchen DISCONTD: carvedilol (COREG) 6.25 MG tablet Take 6.25 mg by mouth 2 (two) times daily with a meal.  No current facility-administered medications on file as of 07/14/2011.     Allergies  Allergen Reactions  . Colchicine Other (See Comments)    Whelps.  . Morphine Itching  . Risperdal Other (See Comments)    Auditory hallucinations.    History   Social History  . Marital Status: Single    Spouse Name: N/A    Number of Children: N/A  . Years of Education: N/A   Occupational History  . Not on file.   Social History Main Topics  . Smoking status: Former Smoker -- 1.0 packs/day    Types: Cigarettes    Quit date: 11/17/2008  . Smokeless tobacco: Former Neurosurgeon  . Alcohol Use: No  . Drug Use: No  . Sexually Active: Not on file   Other Topics Concern  . Not on file    Social History Narrative  . No narrative on file    No family history on file.  PHYSICAL EXAM: Filed Vitals:   07/14/11 1339  BP: 100/60  Pulse: 100   General:  Chronically ill appearing. No respiratory difficulty HEENT: normal Neck: supple. JVD 10-11. Carotids 2+ bilat; no bruits. No lymphadenopathy or thryomegaly appreciated. Cor: PMI nondisplaced. Regular rate & rhythm. No rubs, S3 gallops or murmurs. Lungs: Decreased in the bases  Abdomen: Obese, soft, nontender, nondistended. No hepatosplenomegaly. No bruits or masses. Good bowel sounds. Extremities: no cyanosis, clubbing, rash, R and LLE 2-3+ edema Neuro: alert & oriented x 3, cranial nerves grossly intact. moves all 4 extremities w/o difficulty. Affect pleasant.  ECG:  No results found for this or any previous visit (from the past 24 hour(s)). No results found.   ASSESSMENT: 1. Acute/Chronic Systolic Heart Failure EF 30-35% 2. Morbid Obesity 3. Acute /Chroinc Respiratory Failure 4. Schizophrenia .    PLAN/DISCUSSION: 51 year old African American female evaluated in Heart Failure Clinic today with volume over load despite the addition of Metolazone 2.5 mg x 3 days. After review of records she is at least 20 pounds over her baseline. NYHA Class IIIb-IV symptoms. Dypsnea at rest and with exertion. Will admit to telemetry for IV diuretics. Repeat ECHO. Decrease Carvedilol 3.125 mg bid.  Consult care management and social work for assistance with heart education and home health.

## 2011-07-14 NOTE — Progress Notes (Signed)
Pt admitted to room 4736. Hooked up to tele. VS stable and IV started.  Pt oriented to room and call light.  Will continue to monitor.

## 2011-07-15 ENCOUNTER — Inpatient Hospital Stay (HOSPITAL_COMMUNITY): Payer: Medicare Other

## 2011-07-15 DIAGNOSIS — I519 Heart disease, unspecified: Secondary | ICD-10-CM

## 2011-07-15 LAB — BASIC METABOLIC PANEL
BUN: 23 mg/dL (ref 6–23)
CO2: 29 mEq/L (ref 19–32)
Chloride: 99 mEq/L (ref 96–112)
GFR calc Af Amer: 54 mL/min — ABNORMAL LOW (ref >90)
Glucose, Bld: 119 mg/dL — ABNORMAL HIGH (ref 70–99)
Potassium: 3.6 mEq/L (ref 3.5–5.1)

## 2011-07-15 MED ORDER — POTASSIUM CHLORIDE 20 MEQ/15ML (10%) PO LIQD
40.0000 meq | Freq: Once | ORAL | Status: AC
  Administered 2011-07-15: 40 meq via ORAL
  Filled 2011-07-15: qty 30

## 2011-07-15 MED ORDER — ISOSORB DINITRATE-HYDRALAZINE 20-37.5 MG PO TABS
0.5000 | ORAL_TABLET | Freq: Three times a day (TID) | ORAL | Status: DC
  Administered 2011-07-15: 0.5 via ORAL
  Administered 2011-07-15 – 2011-07-16 (×2): via ORAL
  Administered 2011-07-16 (×2): 0.5 via ORAL
  Administered 2011-07-17 (×2): via ORAL
  Administered 2011-07-17 – 2011-07-18 (×2): 0.5 via ORAL
  Filled 2011-07-15 (×13): qty 0.5

## 2011-07-15 NOTE — Progress Notes (Addendum)
Patient ID: [name], female   DOB: 05-Feb-1961, 51 y.o.   MRN: [id number]    SUBJECTIVE: Still short of breath and orthopneic.  Diuresed well.  C/o left knee pain.      Marland Kitchen aspirin EC  81 mg Oral Daily  . carvedilol  3.125 mg Oral BID WC  . enoxaparin  40 mg Subcutaneous Q24H  . furosemide  80 mg Intravenous Q8H  . losartan  50 mg Oral Daily  . pantoprazole  40 mg Oral Q1200  . potassium chloride  40 mEq Oral BID  . QUEtiapine  400 mg Oral QHS  . sodium chloride  3 mL Intravenous Q12H      Filed Vitals:   07/14/11 1610 07/14/11 2118 07/15/11 0629  BP: 133/87 119/74 109/75  Pulse: 103 103 106  Temp: 97.5 F (36.4 C) 97.9 F (36.6 C) 97.4 F (36.3 C)  TempSrc: Oral Oral Oral  Resp: 24 20 18   Height: 5\' 7"  (1.702 m)    Weight: 351 lb 13.7 oz (159.6 kg)  345 lb (156.491 kg)  SpO2: 95% 98% 93%    Intake/Output Summary (Last 24 hours) at 07/15/11 0812 Last data filed at 07/15/11 0640  Gross per 24 hour  Intake    147 ml  Output   2678 ml  Net  -2531 ml    LABS: Basic Metabolic Panel:  Basename 07/14/11 1716  NA 137  K 3.5  CL 99  CO2 29  GLUCOSE 131*  BUN 25*  CREATININE 1.25*  CALCIUM 8.7  MG 1.6  PHOS --   Liver Function Tests:  Basename 07/14/11 1716  AST 28  ALT 31  ALKPHOS 140*  BILITOT 0.9  PROT 8.2  ALBUMIN 2.9*   No results found for this basename: LIPASE:2,AMYLASE:2 in the last 72 hours CBC:  Basename 07/14/11 1716  WBC 8.5  NEUTROABS --  HGB 10.1*  HCT 32.2*  MCV 72.9*  PLT 252   Cardiac Enzymes: No results found for this basename: CKTOTAL:3,CKMB:3,CKMBINDEX:3,TROPONINI:3 in the last 72 hours BNP: No components found with this basename: POCBNP:3 D-Dimer: No results found for this basename: DDIMER:2 in the last 72 hours Hemoglobin A1C: No results found for this basename: HGBA1C in the last 72 hours Fasting Lipid Panel: No results found for this basename: CHOL,HDL,LDLCALC,TRIG,CHOLHDL,LDLDIRECT in the last 72  hours Thyroid Function Tests:  Basename 07/14/11 1716  TSH 0.509  T4TOTAL --  T3FREE --  THYROIDAB --   Anemia Panel: No results found for this basename: VITAMINB12,FOLATE,FERRITIN,TIBC,IRON,RETICCTPCT in the last 72 hours  RADIOLOGY: Dg Knee Complete 4 Views Left  07/10/2011  *RADIOLOGY REPORT*  Clinical Data: Bilateral knee pain.  LEFT KNEE - COMPLETE 4+ VIEW 07/10/2011:  Comparison: Left knee x-rays 03/17/2009 [facility].  Findings: Mild to moderate medial compartment joint space narrowing and associated mild hypertrophic spurring, unchanged.  Lateral and patellofemoral compartment joint spaces well preserved.  No evidence of acute fracture or dislocation.  Large joint effusion.  IMPRESSION: No acute osseous abnormality.  Mild medial compartment osteoarthritis.  Large joint effusion.  Original Report Authenticated By: Arnell Sieving, M.D.   Dg Knee Complete 4 Views Right  07/10/2011  *RADIOLOGY REPORT*  Clinical Data: Bilateral knee pain.  RIGHT KNEE - COMPLETE 4+ VIEW 07/10/2011:  Comparison: Right knee x-rays 03/17/2009 [facility].  Findings: Tricompartment joint space narrowing and associated hypertrophic spurring.  No evidence of acute fracture or dislocation.  No visible joint effusion.  Focal area of sclerosis in the lateral  tibial plateau on the prior examination is less well seen currently.  IMPRESSION: No acute osseous abnormality.  Osteoarthritis.  Original Report Authenticated By: Arnell Sieving, M.D.    PHYSICAL EXAM General: NAD, morbidly obese Neck: JVP 12-14 cm, no thyromegaly or thyroid nodule.  Lungs: Clear to auscultation bilaterally with normal respiratory effort. CV: Nondisplaced PMI.  Heart regular S1/S2, no S3/S4, no murmur.  1+ edema to knees bilaterally.  No carotid bruit.  Normal pedal pulses.  Abdomen: Soft, nontender, no hepatosplenomegaly, no distention.  Neurologic: Alert and oriented x 3.  Psych: Normal affect. Extremities: No  clubbing or cyanosis.   TELEMETRY: Reviewed telemetry pt in NSR  ASSESSMENT AND PLAN: 51 yo with history of nonischemic cardiomyopathy and MV repair presents with acute on chronic systolic CHF.  I think that the major issue here has been noncompliance with home medical regimen.  1. CHF: Still very volume overloaded.  Diuresed well on current Lasix regimen.  Will continue.  Will add 1/2 tab Bidil tid for afterload reduction. Continue current Coreg and losartan. Still waiting for am labs. 2. OA: Left knee pain.  Will use Percocet as needed.  3. H/o MV repair: No significant murmur on exam.   [name] 07/15/2011 8:15 AM

## 2011-07-15 NOTE — Progress Notes (Deleted)
Pt. Refuses CPAP at this time. RN is aware.  

## 2011-07-15 NOTE — Progress Notes (Signed)
  Echocardiogram 2D Echocardiogram has been performed.  [name] 07/15/2011, 12:24 PM

## 2011-07-15 NOTE — Plan of Care (Signed)
Problem: Not Ready for Diet/Lifestyle Change (NB-1.3) Goal: Nutrition education Formal process to instruct or train a patient/client in a skill or to impart knowledge to help patients/clients voluntarily manage or modify food choices and eating behavior to maintain or improve health.  Outcome: Completed/Met Date Met:  07/15/11 RD was verbally consulted by RN for CHF education. Patient is non-compliant with low sodium diet. States that it is too difficult to follow. RD attempted to talk with patient about food choices and strategies but patient continued to fall asleep mis discussion. Patient stated that she "has not slept in 10 months," and that she would read the information provided on her own. Patient already had the CHF booklet. RD did explain the recommendations and why a low salt diet is important. Patient was not very interested in hearing information. RD left handouts with the patient to review, encouraged to read and if she had questions to have RN contact RD. RD expects poor compliance. No additional nutrition intervention at this time.   [name]

## 2011-07-15 NOTE — Progress Notes (Signed)
Placed pt. On CPAP auto titrate (min:4, max: 12) with 2L O2 bled in via nasal mask. Pt. Refused CPAP & stated that she was in pain. RN was made aware. Called back to room by RN & RN stated that pt. Was requesting a Full face mask (what pt. Wears at home/used to wear at home). Pt. Was taken a FFM & is currently on CPAP auto titrate & tolerating well at this time.

## 2011-07-16 DIAGNOSIS — I5022 Chronic systolic (congestive) heart failure: Secondary | ICD-10-CM

## 2011-07-16 LAB — BASIC METABOLIC PANEL
BUN: 27 mg/dL — ABNORMAL HIGH (ref 6–23)
CO2: 28 mEq/L (ref 19–32)
Chloride: 96 mEq/L (ref 96–112)
Creatinine, Ser: 1.51 mg/dL — ABNORMAL HIGH (ref 0.50–1.10)

## 2011-07-16 MED ORDER — FUROSEMIDE 10 MG/ML IJ SOLN
40.0000 mg | Freq: Three times a day (TID) | INTRAMUSCULAR | Status: DC
  Administered 2011-07-16 – 2011-07-18 (×6): 40 mg via INTRAVENOUS
  Filled 2011-07-16 (×9): qty 4

## 2011-07-16 NOTE — Progress Notes (Signed)
Patient ID: [name], female   DOB: [birth year], 51 y.o.   MRN: [id number] SUBJECTIVE: She denies any chest pain or shortness of breath this morning. Sitting up eating lunch. Her biggest complaint is that her legs hurt.  Filed Vitals:   07/15/11 0629 07/15/11 1621 07/15/11 2100 07/16/11 0500  BP: 109/75 124/82 107/69 142/75  Pulse: 106 102 100 100  Temp: 97.4 F (36.3 C) 97.4 F (36.3 C) 97.7 F (36.5 C) 97.7 F (36.5 C)  TempSrc: Oral Oral Oral Oral  Resp: 18 20 20 20   Height:      Weight: 345 lb (156.491 kg)   345 lb 0.3 oz (156.5 kg)  SpO2: 93% 97% 95% 97%    Intake/Output Summary (Last 24 hours) at 07/16/11 1259 Last data filed at 07/16/11 1000  Gross per 24 hour  Intake   1274 ml  Output   2051 ml  Net   -777 ml    LABS: Basic Metabolic Panel:  Basename 07/16/11 0600 07/15/11 0700 07/14/11 1716  NA 136 140 --  K 4.1 3.6 --  CL 96 99 --  CO2 28 29 --  GLUCOSE 110* 119* --  BUN 27* 23 --  CREATININE 1.51* 1.31* --  CALCIUM 9.2 8.9 --  MG -- -- 1.6  PHOS -- -- --   Liver Function Tests:  Basename 07/14/11 1716  AST 28  ALT 31  ALKPHOS 140*  BILITOT 0.9  PROT 8.2  ALBUMIN 2.9*   No results found for this basename: LIPASE:2,AMYLASE:2 in the last 72 hours CBC:  Basename 07/14/11 1716  WBC 8.5  NEUTROABS --  HGB 10.1*  HCT 32.2*  MCV 72.9*  PLT 252   Cardiac Enzymes: No results found for this basename: CKTOTAL:3,CKMB:3,CKMBINDEX:3,TROPONINI:3 in the last 72 hours BNP: No components found with this basename: POCBNP:3 D-Dimer: No results found for this basename: DDIMER:2 in the last 72 hours Hemoglobin A1C: No results found for this basename: HGBA1C in the last 72 hours Fasting Lipid Panel: No results found for this basename: CHOL,HDL,LDLCALC,TRIG,CHOLHDL,LDLDIRECT in the last 72 hours Thyroid Function Tests:  Basename 07/14/11 1716  TSH 0.509  T4TOTAL --  T3FREE --  THYROIDAB --   Anemia Panel: No results found for this basename:  VITAMINB12,FOLATE,FERRITIN,TIBC,IRON,RETICCTPCT in the last 72 hours  RADIOLOGY: Dg Chest Port 1 View  07/15/2011  *RADIOLOGY REPORT*  Clinical Data: Short of breath and CHF.  Morbid obesity. Hypertension.  PORTABLE CHEST - 1 VIEW  Comparison: 03/01/2010  Findings: Mildly degraded exam due to AP portable technique and patient body habitus.  Numerous leads and wires project over the chest.  Cardiomegaly accentuated by AP portable technique.  No definite pleural effusion. No pneumothorax.  Mild interstitial edema, superimposed upon low lung volumes.  Mild bibasilar atelectasis.  IMPRESSION:  1. Decreased sensitivity and specificity exam due to technique related factors, as described above. 2.  Low lung volumes with mild congestive heart failure.  Original Report Authenticated By: Consuello Bossier, M.D.   Dg Knee Complete 4 Views Left  07/10/2011  *RADIOLOGY REPORT*  Clinical Data: Bilateral knee pain.  LEFT KNEE - COMPLETE 4+ VIEW 07/10/2011:  Comparison: Left knee x-rays 03/17/2009 [facility].  Findings: Mild to moderate medial compartment joint space narrowing and associated mild hypertrophic spurring, unchanged.  Lateral and patellofemoral compartment joint spaces well preserved.  No evidence of acute fracture or dislocation.  Large joint effusion.  IMPRESSION: No acute osseous abnormality.  Mild medial compartment osteoarthritis.  Large joint effusion.  Original Report Authenticated By: Arnell Sieving, M.D.   Dg Knee Complete 4 Views Right  07/10/2011  *RADIOLOGY REPORT*  Clinical Data: Bilateral knee pain.  RIGHT KNEE - COMPLETE 4+ VIEW 07/10/2011:  Comparison: Right knee x-rays 03/17/2009 [facility].  Findings: Tricompartment joint space narrowing and associated hypertrophic spurring.  No evidence of acute fracture or dislocation.  No visible joint effusion.  Focal area of sclerosis in the lateral tibial plateau on the prior examination is less well seen currently.  IMPRESSION: No  acute osseous abnormality.  Osteoarthritis.  Original Report Authenticated By: Arnell Sieving, M.D.    PHYSICAL EXAM General: Well developed, well nourished, in no acute distress, morbidly obese Head: Eyes PERRLA, No xanthomas.   Normal cephalic and atramatic  Lungs: Clear bilaterally to auscultation and percussion. Heart: HRRR S1 S2, with soft S4 murmur.  Pulses are 2+ & equal.            No carotid bruit. JVD is hard to evaluate.  No abdominal bruits. No femoral bruits. Abdomen: Bowel sounds are positive, abdomen soft and non-tender without masses or                  Hernia's noted. Msk:  Back normal, normal gait. Normal strength and tone for age. Extremities: No clubbing, cyanosis, 2+ pitting edema, hard to examine knees because of edema and fat.  DP +1 Neuro: Alert and oriented X 3. Psych:  Good affect, responds appropriately  TELEMETRY: Reviewed telemetry pt in normal sinus rhythm  ASSESSMENT AND PLAN:  #1: Acute on chronic systolic heart failure. She is improving with diuresis. BUN and creatinine started to increase. Cut back on diuretics.  2: History of mitral valve repair stable normal exam  #3: Morbid obesity  4 bilateral knee pain from osteoarthritis.   Valera Castle, MD 07/16/2011 12:59 PM

## 2011-07-16 NOTE — Progress Notes (Signed)
Pt refused to wear C-PAP.

## 2011-07-16 NOTE — Progress Notes (Signed)
Pt does not want to wear CPAP at this time.  Pt states that she will let RT know when she wants to wear it.  RN aware.  Pt encouraged to notify RN/ RT of any concerns.

## 2011-07-17 DIAGNOSIS — I5023 Acute on chronic systolic (congestive) heart failure: Principal | ICD-10-CM

## 2011-07-17 LAB — BASIC METABOLIC PANEL
CO2: 32 mEq/L (ref 19–32)
Calcium: 9.7 mg/dL (ref 8.4–10.5)
Glucose, Bld: 115 mg/dL — ABNORMAL HIGH (ref 70–99)
Sodium: 138 mEq/L (ref 135–145)

## 2011-07-17 NOTE — Progress Notes (Signed)
Patient ID: [name], female   DOB: 28-Sep-1960, 51 y.o.   MRN: [id number] SUBJECTIVE: Patient denies dyspnea and chest pain; complains of left knee pain  Filed Vitals:   07/16/11 0500 07/16/11 1404 07/16/11 2041 07/17/11 0439  BP: 142/75 95/50 123/71 117/56  Pulse: 100 92 92 90  Temp: 97.7 F (36.5 C) 97.4 F (36.3 C) 97.5 F (36.4 C) 97.8 F (36.6 C)  TempSrc: Oral Oral Oral   Resp: 20 18 20 22   Height:      Weight: 345 lb 0.3 oz (156.5 kg)   345 lb 3.2 oz (156.582 kg)  SpO2: 97% 99% 98% 96%    Intake/Output Summary (Last 24 hours) at 07/17/11 1156 Last data filed at 07/17/11 0856  Gross per 24 hour  Intake   1388 ml  Output   2850 ml  Net  -1462 ml    LABS: Basic Metabolic Panel:  Basename 07/17/11 0630 07/16/11 0600 07/14/11 1716  NA 138 136 --  K 4.3 4.1 --  CL 97 96 --  CO2 32 28 --  GLUCOSE 115* 110* --  BUN 29* 27* --  CREATININE 1.41* 1.51* --  CALCIUM 9.7 9.2 --  MG -- -- 1.6  PHOS -- -- --   Liver Function Tests:  Basename 07/14/11 1716  AST 28  ALT 31  ALKPHOS 140*  BILITOT 0.9  PROT 8.2  ALBUMIN 2.9*   No results found for this basename: LIPASE:2,AMYLASE:2 in the last 72 hours CBC:  Basename 07/14/11 1716  WBC 8.5  NEUTROABS --  HGB 10.1*  HCT 32.2*  MCV 72.9*  PLT 252   Thyroid Function Tests:  Basename 07/14/11 1716  TSH 0.509  T4TOTAL --  T3FREE --  THYROIDAB --   RADIOLOGY: Dg Chest Port 1 View  07/15/2011  *RADIOLOGY REPORT*  Clinical Data: Short of breath and CHF.  Morbid obesity. Hypertension.  PORTABLE CHEST - 1 VIEW  Comparison: 03/01/2010  Findings: Mildly degraded exam due to AP portable technique and patient body habitus.  Numerous leads and wires project over the chest.  Cardiomegaly accentuated by AP portable technique.  No definite pleural effusion. No pneumothorax.  Mild interstitial edema, superimposed upon low lung volumes.  Mild bibasilar atelectasis.  IMPRESSION:  1. Decreased sensitivity and specificity  exam due to technique related factors, as described above. 2.  Low lung volumes with mild congestive heart failure.  Original Report Authenticated By: Consuello Bossier, M.D.   Dg Knee Complete 4 Views Left  07/10/2011  *RADIOLOGY REPORT*  Clinical Data: Bilateral knee pain.  LEFT KNEE - COMPLETE 4+ VIEW 07/10/2011:  Comparison: Left knee x-rays 03/17/2009 [facility].  Findings: Mild to moderate medial compartment joint space narrowing and associated mild hypertrophic spurring, unchanged.  Lateral and patellofemoral compartment joint spaces well preserved.  No evidence of acute fracture or dislocation.  Large joint effusion.  IMPRESSION: No acute osseous abnormality.  Mild medial compartment osteoarthritis.  Large joint effusion.  Original Report Authenticated By: Arnell Sieving, M.D.   Dg Knee Complete 4 Views Right  07/10/2011  *RADIOLOGY REPORT*  Clinical Data: Bilateral knee pain.  RIGHT KNEE - COMPLETE 4+ VIEW 07/10/2011:  Comparison: Right knee x-rays 03/17/2009 [facility].  Findings: Tricompartment joint space narrowing and associated hypertrophic spurring.  No evidence of acute fracture or dislocation.  No visible joint effusion.  Focal area of sclerosis in the lateral tibial plateau on the prior examination is less well seen currently.  IMPRESSION: No acute  osseous abnormality.  Osteoarthritis.  Original Report Authenticated By: Arnell Sieving, M.D.    PHYSICAL EXAM General: Well developed, in no acute distress, morbidly obese Head: Normal cephalic and atramatic  Lungs: Clear bilaterally to auscultation and percussion. Heart: HRRR S1 S2, with soft S4 murmur.   Abdomen: Bowel sounds are positive, abdomen soft and non-tender without masses or                  Hernia's noted. Extremities: No clubbing, cyanosis, 1+ pitting edema, hard to examine knees because of edema and fat.  Neuro: Alert and oriented X 3.  TELEMETRY: Reviewed telemetry pt in normal sinus  rhythm  ASSESSMENT AND PLAN:  #1: Acute on chronic systolic heart failure. She is improving with diuresis. -1.4 liters yesterday; continue present dose of lasix and follow renal function.  #2: History of mitral valve repair stable normal exam  #3: Morbid obesity  #4 bilateral knee pain from osteoarthritis.   Olga Millers, MD 07/17/2011 11:56 AM

## 2011-07-18 DIAGNOSIS — M25562 Pain in left knee: Secondary | ICD-10-CM

## 2011-07-18 DIAGNOSIS — M79609 Pain in unspecified limb: Secondary | ICD-10-CM

## 2011-07-18 DIAGNOSIS — M25569 Pain in unspecified knee: Secondary | ICD-10-CM

## 2011-07-18 LAB — BASIC METABOLIC PANEL
BUN: 28 mg/dL — ABNORMAL HIGH (ref 6–23)
GFR calc non Af Amer: 50 mL/min — ABNORMAL LOW (ref >90)
Glucose, Bld: 114 mg/dL — ABNORMAL HIGH (ref 70–99)
Potassium: 4.7 mEq/L (ref 3.5–5.1)

## 2011-07-18 MED ORDER — ISOSORB DINITRATE-HYDRALAZINE 20-37.5 MG PO TABS
1.0000 | ORAL_TABLET | Freq: Three times a day (TID) | ORAL | Status: DC
  Administered 2011-07-18 – 2011-07-21 (×10): 1 via ORAL
  Filled 2011-07-18 (×13): qty 1

## 2011-07-18 MED ORDER — PREDNISONE 20 MG PO TABS
40.0000 mg | ORAL_TABLET | Freq: Every day | ORAL | Status: AC
  Administered 2011-07-18 – 2011-07-20 (×3): 40 mg via ORAL
  Filled 2011-07-18 (×5): qty 2

## 2011-07-18 MED ORDER — FUROSEMIDE 10 MG/ML IJ SOLN
80.0000 mg | Freq: Three times a day (TID) | INTRAMUSCULAR | Status: DC
  Administered 2011-07-18 – 2011-07-19 (×3): 80 mg via INTRAVENOUS
  Filled 2011-07-18 (×6): qty 8

## 2011-07-18 NOTE — Progress Notes (Signed)
VASCULAR LAB PRELIMINARY  PRELIMINARY  PRELIMINARY  PRELIMINARY  Bilateral lower extremity venous completed.    Preliminary report:  No DVT or SVT noted in the bilateral lower extremities.   Sherren Kerns [place], 07/18/2011, 11:06 AM

## 2011-07-18 NOTE — Progress Notes (Addendum)
Subjective:   51 yo with systolic CHF probably secondary to severe MR now s/p MV repair, rheumatoid arthritis, and PUD presents for followup. Last echo showed EF 30-35%. She has been diagnosed with schizophrenia or schizoaffective disorder and sees a psychiatrist.  TEE (6/10): EF 40%, diffuse hypokinesis, mild to moderately dilated LV, severe eccentric MR, small PFO.  Cardiac MRI (6/10): EF 37% with global hypokinesis, moderate to severe MR, no delayed enhancement (no evidence for sarcoidosis or other infiltrative disease.  TTE (10/10) after MV repair: EF 25-30%, moderately dilated LV, moderate diastolic dysfunction, mildly depressed RV function, trivial MR, MV mean gradient 5 mmHg, PASP 30 mmHg.  RHC (12/10): mean RA 19, PA 46/26, mean PCWP 28, CI 2.5, SVO2 63%.  TTE (2/11): EF 35-40% with diffuse hypokinesis, no significant MR or MS.  TTE (7/11): EF 45%, mild global hypokinesis, s/p MV repair, no mitral regurgitation, minimal mitral stenosis, PA systolic pressure 40 mmHg, mild LV dilation.  TTE (11/12): EF 30-35%, mild LV dilation, mild LVH, s/p MV repair with no regurgitation and minimal stenosis   Admit 07/14/11 for volume overload. On Lasix 80 mg IV Q 8 hours x 48 hours then decreased 40 mg IV Q 8 hours. I/O - 592. Weight down 8 pounds since admission.  ECHO EF 25-30%   Creatinine 1.3>1.5>1.4>1.23  Breathing better. Complains of worsening LLE pain. Unable to walk due to pain. Denies Orhtopnea/PND. Using CPAP at night.    Intake/Output Summary (Last 24 hours) at 07/18/11 0753 Last data filed at 07/18/11 1610  Gross per 24 hour  Intake   1508 ml  Output   2100 ml  Net   -592 ml    Current meds:    . aspirin EC  81 mg Oral Daily  . carvedilol  3.125 mg Oral BID WC  . enoxaparin  40 mg Subcutaneous Q24H  . furosemide  40 mg Intravenous Q8H  . isosorbide-hydrALAZINE  0.5 tablet Oral Q8H  . losartan  50 mg Oral Daily  . pantoprazole  40 mg Oral Q1200  . potassium chloride  40  mEq Oral BID  . QUEtiapine  400 mg Oral QHS  . sodium chloride  3 mL Intravenous Q12H   Infusions:     Objective:  Blood pressure 135/87, pulse 110, temperature 97.3 F (36.3 C), temperature source Oral, resp. rate 22, height 5\' 7"  (1.702 m), weight 155.629 kg (343 lb 1.6 oz), SpO2 94.00%. Weight change: -0.953 kg (-2 lb 1.6 oz)  Physical Exam: General:  Well appearing. No resp difficulty HEENT: normal Neck: supple. JVP unable to assess  Carotids 2+ bilat; no bruits. No lymphadenopathy or thryomegaly appreciated. Cor: PMI nondisplaced. Regular rate & rhythm. No rubs, gallops or murmurs. Lungs: clear 2Liters Abdomen: obese, soft, nontender, nondistended. No hepatosplenomegaly. No bruits or masses. Good bowel sounds. Extremities: no cyanosis, clubbing, rash, RLE trace LLE 1+edema. L knee warm Neuro: alert & orientedx3, cranial nerves grossly intact. moves all 4 extremities w/o difficulty. Affect pleasant  Telemetry:   Lab Results: Basic Metabolic Panel:  Lab 07/18/11 9604 07/17/11 0630 07/16/11 0600 07/15/11 0700 07/14/11 1716  NA 137 138 136 140 137  K 4.7 4.3 -- -- --  CL 95* 97 96 99 99  CO2 33* 32 28 29 29   GLUCOSE 114* 115* 110* 119* 131*  BUN 28* 29* 27* 23 25*  CREATININE 1.23* 1.41* 1.51* 1.31* 1.25*  CALCIUM 10.1 9.7 9.2 8.9 8.7  MG -- -- -- -- 1.6  PHOS -- -- -- -- --  Liver Function Tests:  Lab 07/14/11 1716  AST 28  ALT 31  ALKPHOS 140*  BILITOT 0.9  PROT 8.2  ALBUMIN 2.9*   No results found for this basename: LIPASE:5,AMYLASE:5 in the last 168 hours No results found for this basename: AMMONIA:5 in the last 168 hours CBC:  Lab 07/14/11 1716  WBC 8.5  NEUTROABS --  HGB 10.1*  HCT 32.2*  MCV 72.9*  PLT 252   Cardiac Enzymes: No results found for this basename: CKTOTAL:5,CKMB:5,CKMBINDEX:5,TROPONINI:5 in the last 168 hours BNP: No components found with this basename: POCBNP:5 CBG: No results found for this basename: GLUCAP:5 in the last 168  hours Microbiology: Lab Results  Component Value Date   CULT NO STAPHYLOCOCCUS AUREUS ISOLATED 12/18/2008   CULT NO GROWTH 5 DAYS 11/09/2008   CULT NO GROWTH 5 DAYS 11/09/2008   CULT NO GROWTH 11/07/2008   No results found for this basename: CULT:2,SDES:2 in the last 168 hours  Imaging: No results found.   ASSESSMENT:  1. Acute/Chronic Systolic Heart Failure EF 25--30%  2. Morbid Obesity  3. Acute /Chroinc Respiratory Failure  4. Schizophrenia  5. LLE/knee pain .      PLAN/DISCUSSION: Volume status remains elevated.Weight down 8 pounds. I/Os -592. Increase Lasix 80 mg IV TID. Increase BIDIL 1 tab q8 hours. Will not add spironoactone or digoxin due to non-compliance.   Obtain lower extremity dopplers. Consider ortho consult.   Will need HH upon d/c   LOS: 4 days   CLEGG,AMY, NP 07/18/2011, 7:53 AM  Patient seen and examined with Tonye Becket, NP. We discussed all aspects of the encounter. I agree with the assessment and plan as stated above.   Volume status much improved on exam likely still with mild volume overload. Agree with continued diuresis. L knee pain likely gout. Will check uric acid and start prednisone burst. If uric acid up add allopurinol. Will need PT/OT soon. Agree with LE Dopplers.   Jaeli Grubb,MD 8:44 AM

## 2011-07-18 NOTE — Progress Notes (Signed)
07/18/11 1558 UR Completed. Tera Mater, RN, BSN [phone]

## 2011-07-19 DIAGNOSIS — M109 Gout, unspecified: Secondary | ICD-10-CM

## 2011-07-19 LAB — BASIC METABOLIC PANEL
BUN: 32 mg/dL — ABNORMAL HIGH (ref 6–23)
CO2: 32 mEq/L (ref 19–32)
Chloride: 93 mEq/L — ABNORMAL LOW (ref 96–112)
GFR calc non Af Amer: 42 mL/min — ABNORMAL LOW (ref >90)
Glucose, Bld: 190 mg/dL — ABNORMAL HIGH (ref 70–99)
Potassium: 4.3 mEq/L (ref 3.5–5.1)

## 2011-07-19 MED ORDER — TORSEMIDE 20 MG PO TABS
80.0000 mg | ORAL_TABLET | Freq: Two times a day (BID) | ORAL | Status: DC
  Filled 2011-07-19 (×3): qty 4

## 2011-07-19 MED ORDER — CARVEDILOL 6.25 MG PO TABS
6.2500 mg | ORAL_TABLET | Freq: Two times a day (BID) | ORAL | Status: DC
  Administered 2011-07-19 – 2011-07-21 (×5): 6.25 mg via ORAL
  Filled 2011-07-19 (×6): qty 1

## 2011-07-19 MED ORDER — FUROSEMIDE 10 MG/ML IJ SOLN
80.0000 mg | Freq: Three times a day (TID) | INTRAMUSCULAR | Status: DC
  Administered 2011-07-19 – 2011-07-20 (×2): 80 mg via INTRAVENOUS
  Filled 2011-07-19 (×6): qty 8

## 2011-07-19 NOTE — Progress Notes (Addendum)
Subjective:  51 yo with systolic CHF probably secondary to severe MR now s/p MV repair, rheumatoid arthritis, and PUD presents for followup. Last echo showed EF 30-35%. She has been diagnosed with schizophrenia or schizoaffective disorder and sees a psychiatrist.  TEE (6/10): EF 40%, diffuse hypokinesis, mild to moderately dilated LV, severe eccentric MR, small PFO.  Cardiac MRI (6/10): EF 37% with global hypokinesis, moderate to severe MR, no delayed enhancement (no evidence for sarcoidosis or other infiltrative disease.  TTE (10/10) after MV repair: EF 25-30%, moderately dilated LV, moderate diastolic dysfunction, mildly depressed RV function, trivial MR, MV mean gradient 5 mmHg, PASP 30 mmHg.  RHC (12/10): mean RA 19, PA 46/26, mean PCWP 28, CI 2.5, SVO2 63%.  TTE (2/11): EF 35-40% with diffuse hypokinesis, no significant MR or MS.  TTE (7/11): EF 45%, mild global hypokinesis, s/p MV repair, no mitral regurgitation, minimal mitral stenosis, PA systolic pressure 40 mmHg, mild LV dilation.  TTE (11/12): EF 30-35%, mild LV dilation, mild LVH, s/p MV repair with no regurgitation and minimal stenosis  3/1/13ECHO EF 25-30%  Admit 07/14/11 for volume overload. Diuresed with IV Lasix.  Weight down 14 pounds since admit. I/O -3.6 liters.Started in Prednisone yesterday. BIDIL increased SBP 120-136. Lower extremity doppler negative. Uric Acid 12.9. Using CPAP at night.   Creatinine 1.3>1.5>1.4>1.23  Decreased pain in L knee Denies SOB/CP/PND Orthopnea.     Intake/Output Summary (Last 24 hours) at 07/19/11 0745 Last data filed at 07/19/11 0455  Gross per 24 hour  Intake    943 ml  Output   3650 ml  Net  -2707 ml    Current meds:    . aspirin EC  81 mg Oral Daily  . carvedilol  3.125 mg Oral BID WC  . enoxaparin  40 mg Subcutaneous Q24H  . furosemide  80 mg Intravenous Q8H  . isosorbide-hydrALAZINE  1 tablet Oral Q8H  . losartan  50 mg Oral Daily  . pantoprazole  40 mg Oral Q1200  .  potassium chloride  40 mEq Oral BID  . predniSONE  40 mg Oral Q breakfast  . QUEtiapine  400 mg Oral QHS  . sodium chloride  3 mL Intravenous Q12H  . DISCONTD: furosemide  40 mg Intravenous Q8H  . DISCONTD: isosorbide-hydrALAZINE  0.5 tablet Oral Q8H   Infusions:     Objective:  Blood pressure 123/77, pulse 97, temperature 97.3 F (36.3 C), temperature source Oral, resp. rate 18, height 5\' 7"  (1.702 m), weight 337 lb 11.2 oz (153.18 kg), SpO2 98.00%. Weight change: -5 lb 6.4 oz (-2.449 kg)  Physical Exam: General:  Well appearing. No resp difficulty HEENT: normal Neck: supple. JVP hard to see. Carotids 2+ bilat; no bruits. No lymphadenopathy or thryomegaly appreciated. Cor: PMI nondisplaced. Regular rate & rhythm. No rubs, gallops or murmurs. Lungs: clear 2l Abdomen: soft, nontender, nondistended. No hepatosplenomegaly. No bruits or masses. Good bowel sounds. Extremities: no cyanosis, clubbing, rash, 1+ edema Neuro: alert & orientedx3, cranial nerves grossly intact. moves all 4 extremities w/o difficulty. Affect pleasant  Telemetry: Sinus Rhythm Lab Results: Basic Metabolic Panel:  Lab 07/18/11 6213 07/17/11 0630 07/16/11 0600 07/15/11 0700 07/14/11 1716  NA 137 138 136 140 137  K 4.7 4.3 -- -- --  CL 95* 97 96 99 99  CO2 33* 32 28 29 29   GLUCOSE 114* 115* 110* 119* 131*  BUN 28* 29* 27* 23 25*  CREATININE 1.23* 1.41* 1.51* 1.31* 1.25*  CALCIUM 10.1 9.7 9.2 8.9  8.7  MG -- -- -- -- 1.6  PHOS -- -- -- -- --   Liver Function Tests:  Lab 07/14/11 1716  AST 28  ALT 31  ALKPHOS 140*  BILITOT 0.9  PROT 8.2  ALBUMIN 2.9*   No results found for this basename: LIPASE:5,AMYLASE:5 in the last 168 hours No results found for this basename: AMMONIA:5 in the last 168 hours CBC:  Lab 07/14/11 1716  WBC 8.5  NEUTROABS --  HGB 10.1*  HCT 32.2*  MCV 72.9*  PLT 252   Cardiac Enzymes: No results found for this basename: CKTOTAL:5,CKMB:5,CKMBINDEX:5,TROPONINI:5 in the last  168 hours BNP: No components found with this basename: POCBNP:5 CBG: No results found for this basename: GLUCAP:5 in the last 168 hours Microbiology: Lab Results  Component Value Date   CULT NO STAPHYLOCOCCUS AUREUS ISOLATED 12/18/2008   CULT NO GROWTH 5 DAYS 11/09/2008   CULT NO GROWTH 5 DAYS 11/09/2008   CULT NO GROWTH 11/07/2008   No results found for this basename: CULT:2,SDES:2 in the last 168 hours  Imaging: No results found.   ASSESSMENT:  1. Acute/Chronic Systolic Heart Failure EF 25--30%  2. Morbid Obesity  3. Acute /Chroinc Respiratory Failure  4. Schizophrenia  5. Gout   PLAN/DISCUSSION: Volume status stable. Weight down 14 pounds. Stop IV Lasix and change to po. Demadex 80 mg po BID. Increase Carvedilol 6.25 mg bid. BMET pending.  Day 2/3 prednisone. Uric Acid 12.9. Will start Allopurinol 300 mg daily. Consult PT to mobilize.   Will need HH upon d/c. Anticipate d/c 1-2 days   LOS: 5 days   CLEGG,AMY, NP 07/19/2011, 7:45 AM  Patient seen and examined with Tonye Becket, NP. We discussed all aspects of the encounter. I agree with the assessment and plan as stated above.   Volume status improving but still will some fluid onboard. If Cr remains stable would continue with IV lasix for at least another day. Gout improving with prednisone. Needs PT/OT consult to start ambulating.  Agree will allopurinol.  Celia Gibbons,MD 9:28 AM

## 2011-07-19 NOTE — Progress Notes (Signed)
New IV placed in RUE, 20g. Pharmacy notified to reschedule IV lasix since 1400 was not given due to IV infiltrated.  Will con't to monitor.

## 2011-07-19 NOTE — Progress Notes (Signed)
Pt states IV site hurts and it appears infiltrated.  Looked at [facility] for possible IV sites with no success.  Another RN took a look at pt and stated she didn't see anything to attempt to stick.  Pt a hard stick from previous IV attempts upon admission.  IV team notified.

## 2011-07-20 LAB — BASIC METABOLIC PANEL
CO2: 27 mEq/L (ref 19–32)
Glucose, Bld: 132 mg/dL — ABNORMAL HIGH (ref 70–99)
Potassium: 5.6 mEq/L — ABNORMAL HIGH (ref 3.5–5.1)
Sodium: 132 mEq/L — ABNORMAL LOW (ref 135–145)

## 2011-07-20 MED ORDER — TORSEMIDE 20 MG PO TABS
80.0000 mg | ORAL_TABLET | Freq: Every day | ORAL | Status: DC
  Administered 2011-07-20 – 2011-07-21 (×2): 80 mg via ORAL
  Filled 2011-07-20 (×2): qty 4

## 2011-07-20 MED ORDER — ZOLPIDEM TARTRATE 5 MG PO TABS
5.0000 mg | ORAL_TABLET | Freq: Every evening | ORAL | Status: DC | PRN
  Administered 2011-07-20: 5 mg via ORAL
  Filled 2011-07-20: qty 1

## 2011-07-20 NOTE — Progress Notes (Signed)
Pt ambulated without o2 from 4736 down to nurses station. Initial sat 99% down to 97% with quick recovery back to 99%

## 2011-07-20 NOTE — Progress Notes (Signed)
Subjective:  51 yo with systolic CHF probably secondary to severe MR now s/p MV repair, rheumatoid arthritis, and PUD presents for followup. Last echo showed EF 30-35%. She has been diagnosed with schizophrenia or schizoaffective disorder and sees a psychiatrist.  TEE (6/10): EF 40%, diffuse hypokinesis, mild to moderately dilated LV, severe eccentric MR, small PFO.  Cardiac MRI (6/10): EF 37% with global hypokinesis, moderate to severe MR, no delayed enhancement (no evidence for sarcoidosis or other infiltrative disease.  TTE (10/10) after MV repair: EF 25-30%, moderately dilated LV, moderate diastolic dysfunction, mildly depressed RV function, trivial MR, MV mean gradient 5 mmHg, PASP 30 mmHg.  RHC (12/10): mean RA 19, PA 46/26, mean PCWP 28, CI 2.5, SVO2 63%.  TTE (2/11): EF 35-40% with diffuse hypokinesis, no significant MR or MS.  TTE (7/11): EF 45%, mild global hypokinesis, s/p MV repair, no mitral regurgitation, minimal mitral stenosis, PA systolic pressure 40 mmHg, mild LV dilation.  TTE (11/12): EF 30-35%, mild LV dilation, mild LVH, s/p MV repair with no regurgitation and minimal stenosis  3/1/13ECHO EF 25-30%   Admit 07/14/11 for volume overload. Diuresed with IV Lasix. Weight down 14 pounds since admit. I/O -780.( only received Lasix once) .Started on Prednisone for Gout.  BIDIL increased SBP 120-136. Lower extremity doppler negative. Uric Acid 12.9. Using CPAP at night. PT/OT ordered yesterday.   Creatinine 1.3>1.5>1.4>1.23>1.44>1.36      Intake/Output Summary (Last 24 hours) at 07/20/11 0814 Last data filed at 07/19/11 1753  Gross per 24 hour  Intake    860 ml  Output   1700 ml  Net   -840 ml    Current meds:    . aspirin EC  81 mg Oral Daily  . carvedilol  6.25 mg Oral BID WC  . enoxaparin  40 mg Subcutaneous Q24H  . furosemide  80 mg Intravenous Q8H  . isosorbide-hydrALAZINE  1 tablet Oral Q8H  . losartan  50 mg Oral Daily  . pantoprazole  40 mg Oral Q1200  .  potassium chloride  40 mEq Oral BID  . predniSONE  40 mg Oral Q breakfast  . QUEtiapine  400 mg Oral QHS  . sodium chloride  3 mL Intravenous Q12H  . DISCONTD: torsemide  80 mg Oral BID   Infusions:     Objective:  Blood pressure 126/82, pulse 106, temperature 97.5 F (36.4 C), temperature source Oral, resp. rate 20, height 5\' 7"  (1.702 m), weight 152.9 kg (337 lb 1.3 oz), SpO2 95.00%. Weight change: -0.28 kg (-9.9 oz)  Physical Exam: General:  Well appearing. No resp difficulty HEENT: normal Neck: supple. JVP 6-7. Carotids 2+ bilat; no bruits. No lymphadenopathy or thryomegaly appreciated. Cor: PMI nondisplaced. Regular rate & rhythm. No rubs, gallops or murmurs. Lungs: clear Abdomen: obese soft, nontender, nondistended. No hepatosplenomegaly. No bruits or masses. Good bowel sounds. Extremities: no cyanosis, clubbing, rash, trace edema Neuro: alert & orientedx3, cranial nerves grossly intact. moves all 4 extremities w/o difficulty. Affect pleasant  Telemetry: Sinus Tach  Lab Results: Basic Metabolic Panel:  Lab 07/20/11 1610 07/19/11 0910 07/18/11 0530 07/17/11 0630 07/16/11 0600 07/14/11 1716  NA 132* 134* 137 138 136 --  K 5.6* 4.3 -- -- -- --  CL 91* 93* 95* 97 96 --  CO2 27 32 33* 32 28 --  GLUCOSE 132* 190* 114* 115* 110* --  BUN 38* 32* 28* 29* 27* --  CREATININE 1.36* 1.44* 1.23* 1.41* 1.51* --  CALCIUM 9.9 10.1 10.1 9.7 9.2 --  MG -- -- -- -- -- 1.6  PHOS -- -- -- -- -- --   Liver Function Tests:  Lab 07/14/11 1716  AST 28  ALT 31  ALKPHOS 140*  BILITOT 0.9  PROT 8.2  ALBUMIN 2.9*   No results found for this basename: LIPASE:5,AMYLASE:5 in the last 168 hours No results found for this basename: AMMONIA:5 in the last 168 hours CBC:  Lab 07/14/11 1716  WBC 8.5  NEUTROABS --  HGB 10.1*  HCT 32.2*  MCV 72.9*  PLT 252   Cardiac Enzymes: No results found for this basename: CKTOTAL:5,CKMB:5,CKMBINDEX:5,TROPONINI:5 in the last 168 hours BNP: No  components found with this basename: POCBNP:5 CBG: No results found for this basename: GLUCAP:5 in the last 168 hours Microbiology: Lab Results  Component Value Date   CULT NO STAPHYLOCOCCUS AUREUS ISOLATED 12/18/2008   CULT NO GROWTH 5 DAYS 11/09/2008   CULT NO GROWTH 5 DAYS 11/09/2008   CULT NO GROWTH 11/07/2008   No results found for this basename: CULT:2,SDES:2 in the last 168 hours  Imaging: No results found.   ASSESSMENT:  1. Acute/Chronic Systolic Heart Failure EF 25--30%  2. Morbid Obesity  3. Acute /Chroinc Respiratory Failure  4. Schizophrenia  5. Gout 6. Deconditioned   PLAN/DISCUSSION: Volume status improving. Weight unchanged. Lasix ordered 80 mg IV  Q8 however she only received once yesterday. Continue IV lasix 80 mg q 8 hours. Creatinine stable. Stop Potassium.   Day 3/3 Prednisone . Continue Allopurinol 300 mg daily.   PT/OT consult pending.   HHRN for D/C  For ongoing HF management    LOS: 6 days  CLEGG,AMY, NP 07/20/2011, 8:14 AM  Patient seen and examined with Tonye Becket, NP. We discussed all aspects of the encounter. I agree with the assessment and plan as stated above. Urine output slowing down. Cr climbing slightly. Edema much improved on exam. Suspect she is nearing euvolemia. Will switch diuretics to po. Possibly home tomorrow. Appreciate PT's eval. Watch potassium.   Ezekial Arns,MD 9:24 AM

## 2011-07-20 NOTE — Progress Notes (Signed)
Placed pt on CPAP, auto titrate settings, via nasal mask with 2 lpm O2 bleed in.  Pt. Tolerating well at this time.

## 2011-07-20 NOTE — Evaluation (Signed)
Occupational Therapy Evaluation Patient Details Name: [name] MRN: [id number] DOB: 07/26/1960 Today's Date: 07/20/2011  Problem List:  Patient Active Problem List  Diagnoses  . GRAVES' DISEASE  . OBESITY NOS  . ANEMIA, IRON DEFICIENCY  . MITRAL REGURGITATION, SEVERE  . MITRAL VALVE DISORDERS  . CHF  . Chronic systolic heart failure  . TEAR, BUCKET, LATERAL MENISCUS  . BACKACHE NOS  . SYMPTOM, APNEA, SLEEP NOS  . LETHARGY  . DYSPNEA  . ABDOMINAL PAIN RIGHT UPPER QUADRANT  . Systolic CHF, chronic  . Hypertension  . Acute on chronic systolic heart failure  . Knee pain, left  . Gout    Past Medical History:  Past Medical History  Diagnosis Date  . Severe mitral regurgitation     Minimally invasive vale repair on 12/18/08. Additionally TV repair  and PFO closure.  . CHF (congestive heart failure)     TEE(6/10): EF 40%  . Microcytic anemia     EGD and colonoscopy in 12/11 did not reveal source of bleeding.Plan capsule endoscopy.  . Gastric ulcer   . Morbid obesity   . Obstructive sleep apnea     on CPAP  . GERD (gastroesophageal reflux disease)   . HTN (hypertension)   . Gout   . Depression   . Colitis 2/11  . SOB (shortness of breath)   . Chronic back pain   . Chronic knee pain   . DVT (deep venous thrombosis)     right leg  . Anxiety   . Rheumatoid arthritis     "shoulders & knees"  . Sleep disturbance     07/14/11 "haven't slept in 10 months; since going to [facility]"   Past Surgical History:  Past Surgical History  Procedure Date  . Cardiac valve replacement 12/2008    cardiac    OT Assessment/Plan/Recommendation OT Assessment Clinical Impression Statement: This 51 y.o. female presents to OT with balance deficits.  Pt appears to approaching baseline level of functioning for BADLs.  Pt. lives with 2 daughters, and has intermittent supervision/assist at home.  PT has recommended HHPT to address balance issues.  No further OT needs identified OT  Recommendation/Assessment: Patient does not need any further OT services OT Recommendation Follow Up Recommendations: No OT follow up OT Goals    OT Evaluation Precautions/Restrictions  Precautions Precautions: Fall Required Braces or Orthoses: No Restrictions Weight Bearing Restrictions: No Prior Functioning Home Living Lives With: Daughter (Lives with 2 daughters and g-dtr.  Dtr. sleeps during day) Receives Help From: Family Type of Home: House Home Layout: One level Home Access: Stairs to enter Entrance Stairs-Rails: None Entrance Stairs-Number of Steps: 3 Bathroom Shower/Tub: Tub/shower unit;Door Bathroom Toilet: Standard Bathroom Accessibility: No Home Adaptive Equipment: Walker - rolling Additional Comments: Furniture walker a lot of times Prior Function Level of Independence: Requires assistive device for independence;Independent with basic ADLs;Needs assistance with homemaking Meal Prep: Total Light Housekeeping: Total Driving: No Vocation: On disability ADL ADL Eating/Feeding: Simulated;Independent Where Assessed - Eating/Feeding: Edge of bed Grooming: Wash/dry hands;Performed;Denture care;Set up Where Assessed - Grooming: Sitting, bed;Unsupported Upper Body Bathing: Performed;Set up Where Assessed - Upper Body Bathing: Sitting, bed;Unsupported Lower Body Bathing: Performed;Minimal assistance Lower Body Bathing Details (indicate cue type and reason): Pt. required min A for bil. feet.  Pt. has LH bath brush she uses at home Where Assessed - Lower Body Bathing: Sit to stand from bed Upper Body Dressing: Performed;Set up Where Assessed - Upper Body Dressing: Unsupported;Sitting, bed Lower Body Dressing: Performed;Modified independent  Where Assessed - Lower Body Dressing: Sit to stand from chair Toilet Transfer: Performed;Modified independent Toilet Transfer Method: Stand pivot Acupuncturist: Extra wide bedside commode Toileting - Clothing  Manipulation: Performed;Modified independent Where Assessed - Toileting Clothing Manipulation: Standing Toileting - Hygiene: Performed;Independent Where Assessed - Toileting Hygiene: Sit on 3-in-1 or toilet ADL Comments: Pt. performed sponge bath EOB.  Pt. did well.  She reports she is close to baseline.   Vision/Perception    Cognition Cognition Arousal/Alertness: Awake/alert Overall Cognitive Status: Appears within functional limits for tasks assessed Sensation/Coordination Sensation Light Touch: Appears Intact Coordination Gross Motor Movements are Fluid and Coordinated: Yes Fine Motor Movements are Fluid and Coordinated: Yes Extremity Assessment RUE Assessment RUE Assessment: Within Functional Limits LUE Assessment LUE Assessment: Within Functional Limits Mobility  Transfers Transfers: Yes Sit to Stand: 6: Modified independent (Device/Increase time);With upper extremity assist;From bed Stand to Sit: 6: Modified independent (Device/Increase time);To bed;With upper extremity assist Exercises   End of Session OT - End of Session Activity Tolerance: Patient tolerated treatment well Patient left: Other (comment) (sitting EOB) General Behavior During Session: [facility] for tasks performed Cognition: [facility] for tasks performed   Brittan Mapel, Ursula Alert M 07/20/2011, 1:45 PM

## 2011-07-20 NOTE — Progress Notes (Signed)
   CARE MANAGEMENT NOTE HEART FAILURE  07/20/2011   Patient:  [name], [name]   Account Number:  [id number]    Date Initiated:  07/18/2011  Documentation initiated by:  Tera Mater  Subjective/Objective Assessment:   51yo female admitted from Heart Failure Clinic with SOB and LLE.  HX:  CHF, HTN, Depression.  Pt. lives with children.   Action/Plan:   Discharge planning for [facility] RN for HF management   Anticipated DC Date:  07/21/2011  Anticipated DC Plan:  HOME W HOME HEALTH SERVICES  DC Planning Services:  CM consult    Choice offered to / List presented to:  C-1 Patient    HH arranged:  HH-1 RN  HH-10 DISEASE MANAGEMENT  HH-2 PT     HH agency:  [facility]    Status of service:  In process, will continue to follow  Medicare Important Message Given:  NO (If response is "NO", the following Medicare IM given date fields will be blank) Date Medicare IM Given:   Date Additional Medicare IM Given:    Discharge Disposition:    Per UR Regulation:  Reviewed for med. necessity/level of care/duration of stay  Comments:   07/20/11 1130  Met with pt. to discuss HF education and discharge planning.  Pt. stated she did try to weigh herself, however her scale had been packed up, and she was unable to use it. That said, she is going to start weighing everyday.  She was interested in having a HH RN for HF management.   She stated she had used [facility] in past and was satisfied with their services.  TC to [facility] with [facility] to make referral for above services.  Anticipated discharged tomorrow 3/7. Tera Mater, RN, BSN Case Manager Phone [phone]   07/18/11 1558 UR Completed. Tera Mater, RN, BSN [phone]   Initial CM contact:  07/20/2011 11:30 AM  By:  Tera Mater Initial CSW contact:     By:      Is this an INP Readmission < 30 days:  N (If "YES" please see readmission information at the bottom of note)  Patient living status prior to this  admission:  FAMILY  Patient setting prior to this admission:  HOME  Comorbid conditions being treated that contributed to this admission:  CHF, HTN, DEPRESSION  CHF Readmission Risk:  high  Type of patient education provided  HF Patient Education Assessment / Teach Back  HF Zone Tool / Magnet  Limit salt intake  Weigh daily     Patient education provided by  [facility]    Was referral made to [facility]:  N  Is the patient's PCP the same as attending:  Y PCP:  BENSIMHON,DANIEL R  Readmission < 30 Days If pt has HH, did they contact the agency before going to the ED:   Name of [facility] agency:    Was the follow-up physician visit scheduled prior to discharge:    Did the patient follow-up with the physician prior to this readmission:    Was there HF Clinic visits prior to readmission:    Were there ED visits between admissions:    Readmit type:    If unscheduled and related indicate reason for readmit:

## 2011-07-20 NOTE — Discharge Instructions (Signed)
Heart Failure Heart failure (HF) is a condition in which the heart has trouble pumping blood. This means your heart does not pump blood efficiently for your body to work well. In some cases of HF, fluid may back up into your lungs or you may have swelling (edema) in your lower legs. HF is a long-term (chronic) condition. It is important for you to take good care of yourself and follow your caregiver's treatment plan. CAUSES   Health conditions:   High blood pressure (hypertension) causes the heart muscle to work harder than normal. When pressure in the blood vessels is high, the heart needs to pump (contract) with more force in order to circulate blood throughout the body. High blood pressure eventually causes the heart to become stiff and weak.   Coronary artery disease (CAD) is the buildup of cholesterol and fat (plaques) in the arteries of the heart. The blockage in the arteries deprives the heart muscle of oxygen and blood. This can cause chest pain and may lead to a heart attack. High blood pressure can also contribute to CAD.   Heart attack (myocardial infarction) occurs when 1 or more arteries in the heart become blocked. The loss of oxygen damages the muscle tissue of the heart. When this happens, part of the heart muscle dies. The injured tissue does not contract as well and weakens the heart's ability to pump blood.   Abnormal heart valves can cause HF when the heart valves do not open and close properly. This makes the heart muscle pump harder to keep the blood flowing.   Heart muscle disease (cardiomyopathy or myocarditis) is damage to the heart muscle from a variety of causes. These can include drug or alcohol abuse, infections, or unknown reasons. These can increase the risk of HF.   Lung disease makes the heart work harder because the lungs do not work properly. This can cause a strain on the heart leading it to fail.   Diabetes increases the risk of HF. High blood sugar contributes  to high fat (lipid) levels in the blood. Diabetes can also cause slow damage to tiny blood vessels that carry important nutrients to the heart muscle. When the heart does not get enough oxygen and food, it can cause the heart to become weak and stiff. This leads to a heart that does not contract efficiently.   Other diseases can contribute to HF. These include abnormal heart rhythms, thyroid problems, and low blood counts (anemia).   Unhealthy lifestyle habits:   Obesity.   Smoking.   Eating foods high in fat and cholesterol.   Eating or drinking beverages high in salt.   Drug or alcohol abuse.   Lack of exercise.  SYMPTOMS  HF symptoms may vary and can be hard to detect. Symptoms may include:  Shortness of breath with activity, such as climbing stairs.   Persistent cough.   Swelling of the feet, ankles, legs, or abdomen.   Unexplained weight gain.   Difficulty breathing when lying flat.   Waking from sleep because of the need to sit up and get more air.   Rapid heartbeat.   Fatigue and loss of energy.   Feeling lightheaded or close to fainting.  DIAGNOSIS  A diagnosis of HF is based on your history, symptoms, physical examination, and diagnostic tests. Diagnostic tests for HF may include:  EKG.   Chest X-ray.   Blood tests.   Exercise stress test.   Blood oxygen test (arterial blood gas).   Evaluation   by a heart doctor (cardiologist).   Ultrasound evaluation of the heart (echocardiogram).   Heart artery test to look for blockages (angiogram).   Radioactive imaging to look at the heart (radionuclide test).  TREATMENT  Treatment is aimed at managing the symptoms of HF. Medicines, lifestyle changes, or surgical intervention may be necessary to treat HF.  Medicines to help treat HF may include:   Angiotensin-converting enzyme (ACE) inhibitors. These block the effects of a blood protein called angiotensin-converting enzyme. ACE inhibitors relax (dilate) the  blood vessels and help lower blood pressure. This decreases the workload of the heart, slows the progression of HF, and improves symptoms.   Angiotensin receptor blockers (ARBs). These medications work similar to ACE inhibitors. ARBs may be an alternative for people who cannot tolerate an ACE inhibitor.   Aldosterone antagonists. This medication helps get rid of extra fluid from your body. This lowers the volume of blood the heart has to pump.   Water pills (diuretics). Diuretics cause the kidneys to remove salt and water from the blood. The extra fluid is removed by urination. By removing extra fluid from the body, diuretics help lower the workload of the heart and help prevent fluid buildup in the lungs so breathing is easier.   Beta blockers. These prevent the heart from beating too fast and improve heart muscle strength. Beta blockers help maintain a normal heart rate, control blood pressure, and improve HF symptoms.   Digitalis. This increases the force of the heartbeat and may be helpful to people with HF or heart rhythm problems.   Healthy lifestyle changes include:   Stopping smoking.   Eating a healthy diet. Avoid foods high in fat. Avoid foods fried in oil or made with fat. A dietician can help with healthy food choices.   Limiting how much salt you eat.   Limiting alcohol intake to no more than 1 drink per day for women and 2 drinks per day for men. Drinking more than that is harmful to your heart. If your heart has already been damaged by alcohol or you have severe HF, drinking alcohol should be stopped completely.   Exercising as directed by your caregiver.   Surgical treatment for HF may include:   Procedures to open blocked arteries, repair damaged heart valves, or remove damaged heart muscle tissue.   A pacemaker to help heart muscle function and to control certain abnormal heart rhythms.   A defibrillator to possibly prevent sudden cardiac death.  HOME CARE  INSTRUCTIONS   Activity level. Your caregiver can help you determine what type of exercise program may be helpful. It is important to maintain your strength. Pace your physical activity to avoid shortness of breath or chest pain. Rest for 1 hour before and after meals. A cardiac rehabilitation program may be helpful to some people with HF.   Diet. Eat a heart healthy diet. Food choices should be low in saturated fat and cholesterol. Talk to a dietician to learn about heart healthy foods.   Salt intake. When you have HF, you need to limit the amount of salt you eat. Eat less than 1500 milligrams (mg) of salt per day or as recommended by your caregiver.   Weight monitoring. Weigh yourself every day. You should weigh yourself in the morning after you urinate and before you eat breakfast. Wear the same amount of clothing each time you weigh yourself. Record your weight daily. Bring your recorded weights to your clinic visits. Tell your caregiver right away if   you have gained 3 lb/1.4 kg in 1 day, or 5 lb/2.3 kg in a week or whatever amount you were told to report.   Blood pressure monitoring. This should be done as directed by your caregiver. A home blood pressure cuff can be purchased at a drugstore. Record your blood pressure numbers and bring them to your clinic visits. Tell your caregiver if you become dizzy or lightheaded upon standing up.   Smoking. If you are currently a smoker, it is time to quit. Nicotine makes your heart work harder by causing your blood vessels to constrict. Do not use nicotine gum or patches before talking to your caregiver.   Follow up. Be sure to schedule a follow-up visit with your caregiver. Keep all your appointments.  SEEK MEDICAL CARE IF:   Your weight increases by 3 lb/1.4 kg in 1 day or 5 lb/2.3 kg in a week.   You notice increasing shortness of breath that is unusual for you. This may happen during rest, sleep, or with activity.   You cough more than normal,  especially with physical activity.   You notice more swelling in your hands, feet, ankles, or belly (abdomen).   You are unable to sleep because it is hard to breathe.   You cough up bloody mucus (sputum).   You begin to feel "jumping" or "fluttering" sensations (palpitations) in your chest.  SEEK IMMEDIATE MEDICAL CARE IF:   You have severe chest pain or pressure which may include symptoms such as:   Pain or pressure in the arms, neck, jaw, or back.   Feeling sweaty.   Feeling sick to your stomach (nauseous).   Feeling short of breath while at rest.   Having a fast or irregular heartbeat.   You experience stroke symptoms. These symptoms include:   Facial weakness or numbness.   Weakness or numbness in an arm, leg, or on one side of your body.   Blurred vision.   Difficulty talking or thinking.   Dizziness or fainting.   Severe headache.  THESE ARE MEDICAL EMERGENCIES. Do not wait to see if the symptoms go away. Call your local emergency services (911 in [place] DO NOT drive yourself to the hospital. IMPORTANT  Make a list of every medicine, vitamin, or herbal supplement you are taking. Keep the list with you at all times. Show it to your caregiver at every visit. Keep the list up-to-date.   Ask your caregiver or pharmacist to write an explanation of each medicine you are taking. This should include:   Why you are taking it.   The possible side effects.   The best time of day to take it.   Foods to take with it or what foods to avoid.   When to stop taking it.  MAKE SURE YOU:   Understand these instructions.   Will watch your condition.   Will get help right away if you are not doing well or get worse.  Document Released: 05/02/2005 Document Revised: 04/21/2011 Document Reviewed: 08/14/2009 [employer] Patient Information 2012 [employer]. 

## 2011-07-20 NOTE — Progress Notes (Signed)
CSW met with patient to offer emotional support and assess for possible depression (referral from CM). Pt reported that she is very tired of dealing with her illness and at 51 years of age she feels 50 years old. She reported that she lives with her two daughters who are somewhat supportive but it depresses her to see her daughters go to work and have activities when she cant's due to her illness. The patient reports that she has tried losing weight but she has difficulty due to her knees.  CSW listened and provided support but also tried to re-direct the patient to more goal oriented solutions. CSW discussed several options for exercise with the patient and gave the patient a referral to water aerobics at the aquatic center. CSW also, with the permission of the patient, referred her to [facility] to possibly be enrolled in [facility].  CSW also administered the PHQ9 depression screen. The patient scored a 10 and had no suicidal thoughts or thoughts of dying. Currently, the patient is seeing a psychiatrist at [facility] and the CSW encouraged her to explore also seeing a therapist at [facility]. The patient reported that she would look into this option. Clinical Social worker completed the psychosocial assessment which can be found in the shadow chart. Clinical Social Worker will sign off for now as social work intervention is no longer needed. Please consult [place] again if new need arises.    Sabino Niemann, MSW, [employer] [phone]

## 2011-07-20 NOTE — Evaluation (Signed)
Physical Therapy Evaluation Patient Details Name: [name] MRN: [id number] DOB: Dec 22, 1960 Today's Date: 07/20/2011  Problem List:  Patient Active Problem List  Diagnoses  . GRAVES' DISEASE  . OBESITY NOS  . ANEMIA, IRON DEFICIENCY  . MITRAL REGURGITATION, SEVERE  . MITRAL VALVE DISORDERS  . CHF  . Chronic systolic heart failure  . TEAR, BUCKET, LATERAL MENISCUS  . BACKACHE NOS  . SYMPTOM, APNEA, SLEEP NOS  . LETHARGY  . DYSPNEA  . ABDOMINAL PAIN RIGHT UPPER QUADRANT  . Systolic CHF, chronic  . Hypertension  . Acute on chronic systolic heart failure  . Knee pain, left  . Gout    Past Medical History:  Past Medical History  Diagnosis Date  . Severe mitral regurgitation     Minimally invasive vale repair on 12/18/08. Additionally TV repair  and PFO closure.  . CHF (congestive heart failure)     TEE(6/10): EF 40%  . Microcytic anemia     EGD and colonoscopy in 12/11 did not reveal source of bleeding.Plan capsule endoscopy.  . Gastric ulcer   . Morbid obesity   . Obstructive sleep apnea     on CPAP  . GERD (gastroesophageal reflux disease)   . HTN (hypertension)   . Gout   . Depression   . Colitis 2/11  . SOB (shortness of breath)   . Chronic back pain   . Chronic knee pain   . DVT (deep venous thrombosis)     right leg  . Anxiety   . Rheumatoid arthritis     "shoulders & knees"  . Sleep disturbance     07/14/11 "haven't slept in 10 months; since going to [facility]"   Past Surgical History:  Past Surgical History  Procedure Date  . Cardiac valve replacement 12/2008    cardiac    PT Assessment/Plan/Recommendation PT Assessment Clinical Impression Statement: Patient admitted with CHF.  Noted some safety awareness isssues without RW.  Needs to use RW at all times on d/c for increased mobility and safety.  Patient unsteady without device.   Recommend HHPT f/u.  Continue PT while here.   PT Recommendation/Assessment: Patient will need skilled PT in the acute  care venue PT Problem List: Decreased activity tolerance;Decreased mobility;Decreased safety awareness PT Therapy Diagnosis : Difficulty walking PT Plan PT Frequency: Min 3X/week PT Treatment/Interventions: Gait training;DME instruction;Stair training;Functional mobility training;Therapeutic activities;Therapeutic exercise;Balance training;Patient/family education PT Recommendation Follow Up Recommendations: Home health PT Equipment Recommended: None recommended by PT PT Goals  Acute Rehab PT Goals PT Goal Formulation: With patient Time For Goal Achievement: 7 days Pt will go Supine/Side to Sit: Independently PT Goal: Supine/Side to Sit - Progress: Goal set today Pt will go Sit to Stand: Independently PT Goal: Sit to Stand - Progress: Goal set today Pt will Ambulate: >150 feet;with modified independence;with least restrictive assistive device PT Goal: Ambulate - Progress: Goal set today Pt will Go Up / Down Stairs: 3-5 stairs;with supervision;with least restrictive assistive device PT Goal: Up/Down Stairs - Progress: Goal set today  PT Evaluation Precautions/Restrictions  Restrictions Weight Bearing Restrictions: No Prior Functioning  Home Living Lives With: Daughter (Lives with 2 daughters and g-dtr.  Dtr. sleeps during day) Receives Help From: Family Type of Home: House Home Layout: One level Home Access: Stairs to enter Entrance Stairs-Rails: None Entrance Stairs-Number of Steps: 3 Bathroom Shower/Tub: Tub/shower unit;Door Bathroom Toilet: Standard Bathroom Accessibility: No Home Adaptive Equipment: Walker - rolling Additional Comments: Furniture walker a lot of times Prior Function Level  of Independence: Requires assistive device for independence;Independent with basic ADLs;Needs assistance with homemaking Meal Prep: Total Light Housekeeping: Total Driving: No Vocation: On disability Cognition Cognition Arousal/Alertness: Awake/alert Overall Cognitive Status:  Appears within functional limits for tasks assessed Sensation/Coordination Sensation Light Touch: Appears Intact Coordination Gross Motor Movements are Fluid and Coordinated: Yes Fine Motor Movements are Fluid and Coordinated: Yes Extremity Assessment RUE Assessment RUE Assessment: Within Functional Limits LUE Assessment LUE Assessment: Within Functional Limits RLE Assessment RLE Assessment: Within Functional Limits LLE Assessment LLE Assessment: Exceptions to [facility] LLE Strength LLE Overall Strength: Deficits LLE Overall Strength Comments: Left hip 2+/5.  All others grossly 3/5. Mobility (including Balance) Bed Mobility Bed Mobility: Yes Rolling Right: 7: Independent Right Sidelying to Sit: 7: Independent Transfers Sit to Stand: 6: Modified independent (Device/Increase time);With upper extremity assist;From bed Stand to Sit: 6: Modified independent (Device/Increase time);With upper extremity assist;To bed Ambulation/Gait Ambulation/Gait: Yes Ambulation/Gait Assistance: 5: Supervision Ambulation/Gait Assistance Details (indicate cue type and reason): Cues to stay close to RW.  Needs RW for stability for longer distance ambulation for safety. Ambulation Distance (Feet): 75 Feet Assistive device: Rolling walker Gait Pattern: Step-through pattern;Decreased stride length Stairs: No Wheelchair Mobility Wheelchair Mobility: No  Posture/Postural Control Posture/Postural Control: No significant limitations Balance Balance Assessed: No   End of Session PT - End of Session Equipment Utilized During Treatment: Gait belt Activity Tolerance: Patient limited by fatigue Patient left: in bed;with call bell in reach Nurse Communication: Mobility status for transfers;Mobility status for ambulation General Behavior During Session: [facility] for tasks performed Cognition: [facility] for tasks performed  INGOLD,Oria Klimas 07/20/2011, 2:03 PM  [facility] Acute Rehabilitation [phone]  (pager)

## 2011-07-20 NOTE — Progress Notes (Signed)
PT Note Patient has been evaluated by PT.  Full note to follow.  Feel patient is safe to d/c home as long as she uses RW at all times (she has a RW).  Patient agrees to use RW.  Patient will also benefit from obtaining a wide 3N1 commode and HHPT f/u.  Thanks.  [facility] Acute Rehabilitation [phone] (pager)

## 2011-07-21 DIAGNOSIS — I5023 Acute on chronic systolic (congestive) heart failure: Secondary | ICD-10-CM

## 2011-07-21 LAB — BASIC METABOLIC PANEL
CO2: 34 mEq/L — ABNORMAL HIGH (ref 19–32)
Chloride: 90 mEq/L — ABNORMAL LOW (ref 96–112)
Glucose, Bld: 118 mg/dL — ABNORMAL HIGH (ref 70–99)
Potassium: 4.3 mEq/L (ref 3.5–5.1)
Sodium: 134 mEq/L — ABNORMAL LOW (ref 135–145)

## 2011-07-21 MED ORDER — CARVEDILOL 12.5 MG PO TABS: 6.2500 mg | ORAL_TABLET | Freq: Two times a day (BID) | ORAL | Status: DC

## 2011-07-21 MED ORDER — METOLAZONE 2.5 MG PO TABS: 2.5000 mg | ORAL_TABLET | ORAL | Status: DC | PRN

## 2011-07-21 MED ORDER — ISOSORB DINITRATE-HYDRALAZINE 20-37.5 MG PO TABS: 1.0000 | ORAL_TABLET | Freq: Three times a day (TID) | ORAL | Status: DC

## 2011-07-21 NOTE — Progress Notes (Signed)
D/c instructions given to pt and family regarding f/c care and appointments, home medications, diet/activity restrictions and s/s of when to notify MD.  Pt verbalized understanding with all questions answered and pt acknowledged receipt.  IV and telemetry removed.  Will transport pt via wheelchair when pt ride available.

## 2011-07-21 NOTE — Discharge Summary (Signed)
Patient ID: [name] MRN: [id number] DOB/AGE: 11-23-1960 51 y.o.  Admit date: 07/14/2011 Discharge date: 07/21/2011  Primary Discharge Diagnosis 1. Acute/Chronic Systolic Heart Failure EF 25-30%  2. Severe MR, s/p MV repair 2010 3. Morbid Obesity  4. Acute on chroinc respiratory failure  5. Acute on chronic renal failure  6. Gout  Secondary Discharge Diagnosis 1) Schizophrenia 2) Depression 3) HTN 4) OSA     - noncompliance with CPAP  Significant Diagnostic Studies: 1) Echo: LVEF 25-30%, LV with diffuse hypokinesis.  MV with surgical repair, valve area 1.66 cm^2, mean gradient 7 mmHg.   2) LE venous duplex: No obvious evidence of deep vein or superficial thrombosis involving the right lower extremity and left lower Extremity.  No evidence of Baker's cyst on the right or left.  Hospital Course:  [name] is a 51 yo with systolic CHF probably secondary to severe MR now s/p MV repair, rheumatoid arthritis, and PUD presents for followup. Last echo showed EF 30-35%. She has been diagnosed with schizophrenia or schizoaffective disorder and sees a psychiatrist.   She was evaluated in the HF clinic on 2/28 with complaints of dyspnea on exertion, PND and orthopnea as well as progressive lower extremity edema.  She had take 3 days of metolazone without much change.  She admits to medication noncompliance and dietary indiscretions which includes drinking >2 liters of soda per day.   She was admitted with NYHA class IIIb symptoms for IV diuresis.  She tolerated diuresis well, down 13 pounds to 338 pounds.  Renal function remained stable, discharge Cr 1.41.  Coreg cut back due to HF exacerbation.  Bidil was added for afterload reduction.  Spironolactone and digoxin were not added due to problems with follow up.  She began to have pain in her left knee.  DVT ruled out by dopplers on 3/4.  She was treated for gout in left knee.  Prednisone burst and allopurinol given with improvement.      On day of discharge her dyspnea was improved.  She was ambulating with PT but is unsteady and rolling walker has been requested.  Home health has been ordered for RN, disease management and PT.  She is felt stable for home.     Discharge Info: Blood pressure 106/69, pulse 92, temperature 98 F (36.7 C), temperature source Oral, resp. rate 20, height 5\' 7"  (1.702 m), weight 153.4 kg (338 lb 3 oz), SpO2 99.00%.Weight change: 0.5 kg (1 lb 1.6 oz)  Physical Exam:  General: Well appearing. No resp difficulty  HEENT: normal  Neck: supple. JVP hard to see. Carotids 2+ bilat; no bruits. No lymphadenopathy or thryomegaly appreciated.  Cor: PMI nondisplaced. Regular rate & rhythm. No rubs, gallops or murmurs.  Lungs: clear  Abdomen: obese soft, nontender, nondistended. No hepatosplenomegaly. No bruits or masses. Good bowel sounds.  Extremities: no cyanosis, clubbing, rash, trace edema  Neuro: alert & orientedx3, cranial nerves grossly intact. moves all 4 extremities w/o difficulty. Affect pleasant   Results for orders placed during the hospital encounter of 07/14/11 (from the past 24 hour(s))  PRO B NATRIURETIC PEPTIDE     Status: Normal   Collection Time   07/21/11  5:25 AM      Component Value Range   Pro B Natriuretic peptide (BNP) 56.2  0 - 125 (pg/mL)  BASIC METABOLIC PANEL     Status: Abnormal   Collection Time   07/21/11  6:35 AM      Component Value Range  Sodium 134 (*) 135 - 145 (mEq/L)   Potassium 4.3  3.5 - 5.1 (mEq/L)   Chloride 90 (*) 96 - 112 (mEq/L)   CO2 34 (*) 19 - 32 (mEq/L)   Glucose, Bld 118 (*) 70 - 99 (mg/dL)   BUN 43 (*) 6 - 23 (mg/dL)   Creatinine, Ser 1.61 (*) 0.50 - 1.10 (mg/dL)   Calcium 9.3  8.4 - 09.6 (mg/dL)   GFR calc non Af Amer 43 (*) >90 (mL/min)   GFR calc Af Amer 49 (*) >90 (mL/min)     Discharge Medications: Medication List  As of 07/21/2011  3:22 PM   STOP taking these medications         oxyCODONE-acetaminophen 5-325 MG per tablet          TAKE these medications         aspirin EC 81 MG tablet   Take 81 mg by mouth daily.      carvedilol 12.5 MG tablet   Commonly known as: COREG   Take 0.5 tablets (6.25 mg total) by mouth 2 (two) times daily.      esomeprazole 40 MG capsule   Commonly known as: NEXIUM   Take 40 mg by mouth daily before breakfast.      isosorbide-hydrALAZINE 20-37.5 MG per tablet   Commonly known as: BIDIL   Take 1 tablet by mouth every 8 (eight) hours.      losartan 50 MG tablet   Commonly known as: COZAAR   Take 50 mg by mouth daily.      metolazone 2.5 MG tablet   Commonly known as: ZAROXOLYN   Take 1 tablet (2.5 mg total) by mouth as needed.      naproxen 500 MG tablet   Commonly known as: NAPROSYN   Take 1 tablet (500 mg total) by mouth 2 (two) times daily.      QUEtiapine 100 MG tablet   Commonly known as: SEROQUEL   Take 400 mg by mouth at bedtime.      torsemide 20 MG tablet   Commonly known as: DEMADEX   Take 80 mg by mouth daily.            Follow-up Plans & Instructions: Discharge Orders    Future Appointments: Provider: Department: Dept Phone: Center:   07/28/2011 11:15 AM [facility] [phone] None   08/08/2011 2:15 PM [name], MD [facility] [phone] [facility]     Future Orders Please Complete By Expires   Diet - low sodium heart healthy      Increase activity slowly      Heart Failure patients record your daily weight using the same scale at the same time of day      (HEART FAILURE PATIENTS) Call MD:  Anytime you have any of the following symptoms: 1) 3 pound weight gain in 24 hours or 5 pounds in 1 week 2) shortness of breath, with or without a dry hacking cough 3) swelling in the hands, feet or stomach 4) if you have to sleep on extra pillows at night in order to breathe.      Beta Blocker already ordered      Contraindication to ACEI at discharge        Follow-up Information    Follow up with Arvilla Meres, MD on  07/28/2011. (at  11:15      [employer] Code [id number]))    Contact information:   [address]  [place] [address] [phone]           BRING ALL MEDICATIONS WITH YOU TO FOLLOW UP APPOINTMENTS  Time spent with patient to include physician time:35 minutes Signed:  Arvilla Meres, MD 07/21/2011, 3:22 PM  Patient seen and examined with Ulyess Blossom PA-C. We discussed all aspects of the encounter. I agree with the assessment and plan as stated above. Volume status much improved on exam. Reinforced need for daily weights and reviewed use of sliding scale diuretics. Will see in HF clinic next week.   Nikesha Kwasny,MD 3:22 PM

## 2011-07-23 NOTE — H&P (Signed)
Patient seen and examined with Tonye Becket, NP. We discussed all aspects of the encounter. I agree with the assessment and plan as stated above. Please see my notes in the A/P.

## 2011-07-28 ENCOUNTER — Ambulatory Visit (HOSPITAL_COMMUNITY)
Admission: RE | Admit: 2011-07-28 | Discharge: 2011-07-28 | Disposition: A | Discharge status: Home / Self Care | Payer: Medicaid Other | Source: Ambulatory Visit | Attending: Internal Medicine | Admitting: Internal Medicine

## 2011-07-28 VITALS — BP 120/74 | HR 99 | Wt 347.0 lb

## 2011-07-28 DIAGNOSIS — I5022 Chronic systolic (congestive) heart failure: Secondary | ICD-10-CM

## 2011-07-28 LAB — BASIC METABOLIC PANEL
BUN: 11 mg/dL (ref 6–23)
Chloride: 103 mEq/L (ref 96–112)
GFR calc Af Amer: 79 mL/min — ABNORMAL LOW (ref >90)
Glucose, Bld: 114 mg/dL — ABNORMAL HIGH (ref 70–99)
Potassium: 4.5 mEq/L (ref 3.5–5.1)

## 2011-07-28 MED ORDER — TORSEMIDE 20 MG PO TABS: ORAL_TABLET | ORAL | Status: DC

## 2011-07-28 NOTE — Progress Notes (Signed)
HPI:  51 yo with systolic CHF probably secondary to severe MR now s/p MV repair, rheumatoid arthritis, and PUD presents for followup. Last echo showed EF 30-35%. She has been diagnosed with schizophrenia or schizoaffective disorder and sees a psychiatrist.   TEE (6/10): EF 40%, diffuse hypokinesis, mild to moderately dilated LV, severe eccentric MR, small PFO.  Cardiac MRI (6/10): EF 37% with global hypokinesis, moderate to severe MR, no delayed enhancement (no evidence for sarcoidosis or other infiltrative disease.  TTE (10/10) after MV repair: EF 25-30%, moderately dilated LV, moderate diastolic dysfunction, mildly depressed RV function, trivial MR, MV mean gradient 5 mmHg, PASP 30 mmHg.  RHC (12/10): mean RA 19, PA 46/26, mean PCWP 28, CI 2.5, SVO2 63%.  TTE (2/11): EF 35-40% with diffuse hypokinesis, no significant MR or MS.  TTE (7/11): EF 45%, mild global hypokinesis, s/p MV repair, no mitral regurgitation, minimal mitral stenosis, PA systolic pressure 40 mmHg, mild LV dilation.  TTE (11/12): EF 30-35%, mild LV dilation, mild LVH, s/p MV repair with no regurgitation and minimal stenosis  She is here for post hospital follow up today.  She was admitted for volume overload and diuresed 13 pounds with IV diuretics.  Discharge weight 338 pounds on 07/21/11.  Not weighing at home.  Dyspnea at baseline.  Chronic 2 pillow orthopnea.  Compliant with medications since discharge.  Drinking 3 cans of soda in a day, which is down.  Eating can soup frequently.  Using CPAP occasionally.     ROS: All systems negative except as listed in HPI, PMH and Problem List.  Past Medical History  Diagnosis Date  . Severe mitral regurgitation     Minimally invasive vale repair on 12/18/08. Additionally TV repair  and PFO closure.  . CHF (congestive heart failure)     TEE(6/10): EF 40%  . Microcytic anemia     EGD and colonoscopy in 12/11 did not reveal source of bleeding.Plan capsule endoscopy.  . Gastric ulcer   .  Morbid obesity   . Obstructive sleep apnea     on CPAP  . GERD (gastroesophageal reflux disease)   . HTN (hypertension)   . Gout   . Depression   . Colitis 2/11  . SOB (shortness of breath)   . Chronic back pain   . Chronic knee pain   . DVT (deep venous thrombosis)     right leg  . Anxiety   . Rheumatoid arthritis     "shoulders & knees"  . Sleep disturbance     07/14/11 "haven't slept in 10 months; since going to [facility]"    Current Outpatient Prescriptions  Medication Sig Dispense Refill  . aspirin EC 81 MG tablet Take 81 mg by mouth daily.      . carvedilol (COREG) 12.5 MG tablet Take 12.5 mg by mouth 2 (two) times daily.      Marland Kitchen esomeprazole (NEXIUM) 40 MG capsule Take 40 mg by mouth daily before breakfast.        . isosorbide-hydrALAZINE (BIDIL) 20-37.5 MG per tablet Take 1 tablet by mouth every 8 (eight) hours.  90 tablet  6  . losartan (COZAAR) 50 MG tablet Take 50 mg by mouth daily.      . metolazone (ZAROXOLYN) 2.5 MG tablet Take 1 tablet (2.5 mg total) by mouth as needed.      . naproxen (NAPROSYN) 500 MG tablet Take 1 tablet (500 mg total) by mouth 2 (two) times daily.  30 tablet  0  .  QUEtiapine (SEROQUEL) 100 MG tablet Take 400 mg by mouth at bedtime.       . torsemide (DEMADEX) 20 MG tablet Take 80 mg by mouth daily.      Marland Kitchen DISCONTD: carvedilol (COREG) 12.5 MG tablet Take 0.5 tablets (6.25 mg total) by mouth 2 (two) times daily.      * Unsure if she is taking metolazone daily or as needed*   PHYSICAL EXAM: Filed Vitals:   07/28/11 1530  BP: 120/74  Pulse: 99  Weight: 347 lb (157.398 kg)  SpO2: 100%   General:  Morbidly obese, No resp difficulty HEENT: normal Neck: supple. JVP hard to see. Carotids 2+ bilaterally; no bruits. No lymphadenopathy or thryomegaly appreciated. Cor: PMI normal. Regular rate & rhythm. S3 No rubs, or murmurs. Lungs: clear Abdomen: obese, soft, nontender, nondistended. No hepatosplenomegaly. No bruits or masses. Good bowel  sounds. Extremities: no cyanosis, clubbing, rash, R and LLE 1+edema Neuro: alert & orientedx3, cranial nerves grossly intact. Moves all 4 extremities w/o difficulty. Affect pleasant.    ASSESSMENT & PLAN:

## 2011-07-28 NOTE — Patient Instructions (Signed)
Increase demadex to 4 tablets in the morning and 2 tablets in the evening.  Only take metolazone when instructed to do so.   Follow up 1 week.    Do the following things EVERYDAY: 1) Weigh yourself in the morning before breakfast. Write it down and keep it in a log. 2) Take your medicines as prescribed 3) Eat low salt foods--Limit salt (sodium) to 2000mg  per day.  4) Stay as active as you can everyday

## 2011-07-28 NOTE — Assessment & Plan Note (Addendum)
NYHA III-IIIb. Volume status is elevated again today, feel this is due to dietary noncompliance.  Will increase demadex to 80/40 mg daily.  Had an extensive conversation with her concerning need for dietary compliance and daily weights or she will most likely need to be hospitalized again for fluid overload.  She states she understands this and will try to work on monitoring salt intake and continue fluid restrictions.  She is going to bring her medication list next time with her medications so we can sit down and go over all her medications with her because she does not seem to know what she takes.    Patient seen and examined with Ulyess Blossom PA-C. We discussed all aspects of the encounter. I agree with the assessment and plan as stated above.  She is completely noncompliant with her meds and sliding scale. She has very little insight into how severe her condition is and what to do about it. We spent an extensive time trying to educate her but I am not sure how successful this was. We have asked her to return soon for f/u and bring all her meds with her so we can work to create a system that works better for her. Will need Home Health RN. Agree with increasing diuretics as she has recurrent volume overload on exam.

## 2011-08-02 ENCOUNTER — Encounter (HOSPITAL_COMMUNITY): Payer: Self-pay | Admitting: Emergency Medicine

## 2011-08-02 ENCOUNTER — Other Ambulatory Visit: Payer: Self-pay

## 2011-08-02 ENCOUNTER — Inpatient Hospital Stay (HOSPITAL_COMMUNITY)
Admission: EM | Admit: 2011-08-02 | Discharge: 2011-08-06 | DRG: 918 | Disposition: A | Discharge status: Home / Self Care | Payer: Medicare Other | Attending: Internal Medicine | Admitting: Internal Medicine

## 2011-08-02 DIAGNOSIS — N189 Chronic kidney disease, unspecified: Secondary | ICD-10-CM | POA: Diagnosis present

## 2011-08-02 DIAGNOSIS — F259 Schizoaffective disorder, unspecified: Secondary | ICD-10-CM | POA: Diagnosis present

## 2011-08-02 DIAGNOSIS — Z954 Presence of other heart-valve replacement: Secondary | ICD-10-CM

## 2011-08-02 DIAGNOSIS — I129 Hypertensive chronic kidney disease with stage 1 through stage 4 chronic kidney disease, or unspecified chronic kidney disease: Secondary | ICD-10-CM | POA: Diagnosis present

## 2011-08-02 DIAGNOSIS — E876 Hypokalemia: Secondary | ICD-10-CM | POA: Diagnosis present

## 2011-08-02 DIAGNOSIS — T43501A Poisoning by unspecified antipsychotics and neuroleptics, accidental (unintentional), initial encounter: Principal | ICD-10-CM | POA: Diagnosis present

## 2011-08-02 DIAGNOSIS — G4733 Obstructive sleep apnea (adult) (pediatric): Secondary | ICD-10-CM | POA: Diagnosis present

## 2011-08-02 DIAGNOSIS — I5022 Chronic systolic (congestive) heart failure: Secondary | ICD-10-CM | POA: Diagnosis present

## 2011-08-02 DIAGNOSIS — I059 Rheumatic mitral valve disease, unspecified: Secondary | ICD-10-CM | POA: Diagnosis present

## 2011-08-02 DIAGNOSIS — T50901A Poisoning by unspecified drugs, medicaments and biological substances, accidental (unintentional), initial encounter: Secondary | ICD-10-CM

## 2011-08-02 DIAGNOSIS — I509 Heart failure, unspecified: Secondary | ICD-10-CM | POA: Diagnosis present

## 2011-08-02 DIAGNOSIS — M069 Rheumatoid arthritis, unspecified: Secondary | ICD-10-CM | POA: Diagnosis present

## 2011-08-02 DIAGNOSIS — Z6841 Body Mass Index (BMI) 40.0 and over, adult: Secondary | ICD-10-CM

## 2011-08-02 DIAGNOSIS — T43591A Poisoning by other antipsychotics and neuroleptics, accidental (unintentional), initial encounter: Secondary | ICD-10-CM | POA: Diagnosis present

## 2011-08-02 DIAGNOSIS — I952 Hypotension due to drugs: Secondary | ICD-10-CM | POA: Diagnosis present

## 2011-08-02 DIAGNOSIS — Y92009 Unspecified place in unspecified non-institutional (private) residence as the place of occurrence of the external cause: Secondary | ICD-10-CM

## 2011-08-02 DIAGNOSIS — G4700 Insomnia, unspecified: Secondary | ICD-10-CM | POA: Diagnosis present

## 2011-08-02 DIAGNOSIS — N289 Disorder of kidney and ureter, unspecified: Secondary | ICD-10-CM | POA: Diagnosis present

## 2011-08-02 DIAGNOSIS — I9589 Other hypotension: Secondary | ICD-10-CM | POA: Diagnosis present

## 2011-08-02 HISTORY — DX: Post-traumatic stress disorder, unspecified: F43.10

## 2011-08-02 HISTORY — DX: Nocturia: R35.1

## 2011-08-02 HISTORY — DX: Chronic obstructive pulmonary disease, unspecified: J44.9

## 2011-08-02 LAB — CBC
HCT: 34.9 % — ABNORMAL LOW (ref 36.0–46.0)
Hemoglobin: 10.6 g/dL — ABNORMAL LOW (ref 12.0–15.0)
MCHC: 30.4 g/dL (ref 30.0–36.0)
RBC: 4.66 MIL/uL (ref 3.87–5.11)
WBC: 4.5 10*3/uL (ref 4.0–10.5)

## 2011-08-02 LAB — POCT I-STAT, CHEM 8
Chloride: 103 mEq/L (ref 96–112)
Creatinine, Ser: 1.4 mg/dL — ABNORMAL HIGH (ref 0.50–1.10)
Glucose, Bld: 162 mg/dL — ABNORMAL HIGH (ref 70–99)
HCT: 36 % (ref 36.0–46.0)
Hemoglobin: 12.2 g/dL (ref 12.0–15.0)
Potassium: 3.4 mEq/L — ABNORMAL LOW (ref 3.5–5.1)
Sodium: 142 mEq/L (ref 135–145)

## 2011-08-02 LAB — POCT I-STAT TROPONIN I

## 2011-08-02 MED ORDER — SODIUM CHLORIDE 0.9 % IV BOLUS (SEPSIS)
1000.0000 mL | Freq: Once | INTRAVENOUS | Status: AC
  Administered 2011-08-02: 1000 mL via INTRAVENOUS

## 2011-08-02 MED ORDER — ONDANSETRON HCL 4 MG/2ML IJ SOLN: 4.0000 mg | Freq: Once | INTRAMUSCULAR | Status: DC

## 2011-08-02 NOTE — ED Notes (Signed)
AVW:UJWJ<XB> Expected date:<BR> Expected time:11:05 PM<BR> Means of arrival:Ambulance<BR> Comments:<BR> M212 -- Seroquel OD/Suicide Attempt

## 2011-08-02 NOTE — ED Notes (Signed)
Pt states that she had taken the seraquel due to not being able to sleep for the past 7 months.  Pt denies taking them due to harming self.  Also stated that for law enforcement at scene.  Pt has slurred speech but acting appropriately.

## 2011-08-03 ENCOUNTER — Emergency Department (HOSPITAL_COMMUNITY): Payer: Medicare Other

## 2011-08-03 ENCOUNTER — Encounter (HOSPITAL_COMMUNITY): Payer: Self-pay

## 2011-08-03 ENCOUNTER — Other Ambulatory Visit: Payer: Self-pay

## 2011-08-03 ENCOUNTER — Ambulatory Visit (HOSPITAL_COMMUNITY): Payer: Medicaid Other

## 2011-08-03 DIAGNOSIS — F259 Schizoaffective disorder, unspecified: Secondary | ICD-10-CM

## 2011-08-03 DIAGNOSIS — T43501A Poisoning by unspecified antipsychotics and neuroleptics, accidental (unintentional), initial encounter: Secondary | ICD-10-CM | POA: Diagnosis present

## 2011-08-03 DIAGNOSIS — I952 Hypotension due to drugs: Secondary | ICD-10-CM | POA: Diagnosis present

## 2011-08-03 LAB — URINALYSIS, ROUTINE W REFLEX MICROSCOPIC
Bilirubin Urine: NEGATIVE
Glucose, UA: NEGATIVE mg/dL
Hgb urine dipstick: NEGATIVE
Ketones, ur: NEGATIVE mg/dL
Leukocytes, UA: NEGATIVE
Protein, ur: NEGATIVE mg/dL
pH: 5 (ref 5.0–8.0)

## 2011-08-03 LAB — CBC
Platelets: 324 10*3/uL (ref 150–400)
RBC: 4.12 MIL/uL (ref 3.87–5.11)
RDW: 18.3 % — ABNORMAL HIGH (ref 11.5–15.5)
WBC: 4.9 10*3/uL (ref 4.0–10.5)

## 2011-08-03 LAB — BASIC METABOLIC PANEL
Calcium: 8.2 mg/dL — ABNORMAL LOW (ref 8.4–10.5)
Creatinine, Ser: 1.25 mg/dL — ABNORMAL HIGH (ref 0.50–1.10)
GFR calc Af Amer: 57 mL/min — ABNORMAL LOW (ref >90)
GFR calc non Af Amer: 49 mL/min — ABNORMAL LOW (ref >90)
Sodium: 141 mEq/L (ref 135–145)

## 2011-08-03 LAB — RAPID URINE DRUG SCREEN, HOSP PERFORMED
Amphetamines: NOT DETECTED
Benzodiazepines: NOT DETECTED
Cocaine: NOT DETECTED
Opiates: NOT DETECTED

## 2011-08-03 LAB — CARDIAC PANEL(CRET KIN+CKTOT+MB+TROPI)
Relative Index: INVALID (ref 0.0–2.5)
Total CK: 72 U/L (ref 7–177)
Troponin I: <0.3 ng/mL (ref <0.30)

## 2011-08-03 LAB — PHOSPHORUS: Phosphorus: 3.4 mg/dL (ref 2.3–4.6)

## 2011-08-03 LAB — PROCALCITONIN: Procalcitonin: <0.1 ng/mL

## 2011-08-03 LAB — MAGNESIUM
Magnesium: 1.5 mg/dL (ref 1.5–2.5)
Magnesium: 1.5 mg/dL (ref 1.5–2.5)

## 2011-08-03 LAB — LACTIC ACID, PLASMA: Lactic Acid, Venous: 2.8 mmol/L — ABNORMAL HIGH (ref 0.5–2.2)

## 2011-08-03 MED ORDER — ASPIRIN EC 81 MG PO TBEC
81.0000 mg | DELAYED_RELEASE_TABLET | Freq: Every day | ORAL | Status: DC
  Administered 2011-08-03 – 2011-08-06 (×4): 81 mg via ORAL
  Filled 2011-08-03 (×5): qty 1

## 2011-08-03 MED ORDER — SENNA 8.6 MG PO TABS
1.0000 | ORAL_TABLET | Freq: Two times a day (BID) | ORAL | Status: DC
  Administered 2011-08-03 – 2011-08-06 (×6): 8.6 mg via ORAL
  Filled 2011-08-03 (×6): qty 1

## 2011-08-03 MED ORDER — POTASSIUM CHLORIDE CRYS ER 20 MEQ PO TBCR
40.0000 meq | EXTENDED_RELEASE_TABLET | Freq: Two times a day (BID) | ORAL | Status: AC
  Administered 2011-08-03 (×2): 40 meq via ORAL
  Filled 2011-08-03 (×2): qty 2

## 2011-08-03 MED ORDER — ACETAMINOPHEN 325 MG PO TABS: 650.0000 mg | ORAL_TABLET | Freq: Four times a day (QID) | ORAL | Status: DC | PRN

## 2011-08-03 MED ORDER — MUPIROCIN 2 % EX OINT
1.0000 "application " | TOPICAL_OINTMENT | Freq: Two times a day (BID) | CUTANEOUS | Status: DC
  Administered 2011-08-03 – 2011-08-06 (×6): 1 via NASAL
  Filled 2011-08-03: qty 22

## 2011-08-03 MED ORDER — DEXTROSE 5 % IV SOLN
2.0000 ug/min | Freq: Once | INTRAVENOUS | Status: AC
  Administered 2011-08-03: 2.667 ug/min via INTRAVENOUS
  Filled 2011-08-03: qty 4

## 2011-08-03 MED ORDER — NOREPINEPHRINE BITARTRATE 1 MG/ML IJ SOLN
2.0000 ug/min | INTRAVENOUS | Status: DC
  Filled 2011-08-03: qty 4

## 2011-08-03 MED ORDER — SODIUM CHLORIDE 0.9 % IJ SOLN
3.0000 mL | Freq: Two times a day (BID) | INTRAMUSCULAR | Status: DC
  Administered 2011-08-04 – 2011-08-06 (×5): 3 mL via INTRAVENOUS

## 2011-08-03 MED ORDER — CHLORHEXIDINE GLUCONATE CLOTH 2 % EX PADS
6.0000 | MEDICATED_PAD | Freq: Every day | CUTANEOUS | Status: DC
  Administered 2011-08-05 – 2011-08-06 (×2): 6 via TOPICAL

## 2011-08-03 MED ORDER — ACETAMINOPHEN 650 MG RE SUPP: 650.0000 mg | Freq: Four times a day (QID) | RECTAL | Status: DC | PRN

## 2011-08-03 MED ORDER — HEPARIN SODIUM (PORCINE) 5000 UNIT/ML IJ SOLN
5000.0000 [IU] | Freq: Three times a day (TID) | INTRAMUSCULAR | Status: DC
  Administered 2011-08-03 – 2011-08-06 (×10): 5000 [IU] via SUBCUTANEOUS
  Filled 2011-08-03 (×15): qty 1

## 2011-08-03 MED ORDER — DOCUSATE SODIUM 100 MG PO CAPS
100.0000 mg | ORAL_CAPSULE | Freq: Two times a day (BID) | ORAL | Status: DC
  Administered 2011-08-03 – 2011-08-06 (×6): 100 mg via ORAL
  Filled 2011-08-03 (×10): qty 1

## 2011-08-03 NOTE — ED Notes (Signed)
Dr. Dierdre Highman aware of recommendations by poison control and orders placed.

## 2011-08-03 NOTE — ED Notes (Signed)
Repeat ekg completed and copy to Jesusita Oka, RN and one to  Dr. Dierdre Highman. No worsening QT noted or significant QRS changes noted at this time. Pt is showing bigeminy on recent ekg with no past h/o found on prior ekg's.

## 2011-08-03 NOTE — Progress Notes (Signed)
CRITICAL VALUE ALERT  Critical value received:  MRSA positive   Date of notification:  08/03/2011  Time of notification:  1330  Critical value read back:yes  Nurse who received alert:  Kirstie Mirza RN  MD notified (1st page):  Blake Divine  Time of first page: 1330   MD notified (2nd page):  Time of second page:  Responding MD:  Blake Divine  Time MD responded:  1330

## 2011-08-03 NOTE — ED Notes (Signed)
Family phone numbers  Clydie Braun [phone] Ronette [phone] Joey [phone]

## 2011-08-03 NOTE — Consult Note (Signed)
Reason for Consult:Serouel overdose Referring Physician: Dr. Imagene Riches is an 51 y.o. female.  HPI: 51yoF with h/o schizoaffective d/o vs schizophrenia, systolic HF (25-30%) due to severe MR s/p MV  repair, morbid obesity, RA presents with seroquel overdose, hypotension.  Pt was last admitted 2/28 to 3/7 to cardiology with systolic HF due to medication noncompliance  and drinking >2L of soda a day. She was admitted for diuresis and was down 13 lbs to 338. Cr was  stable at 1.4.  She can answer questions but is not a great historian, so history also gathered from ED staff who  spoke with family when they were here earlier. She apparently has been taking Seroquel for the  past few months to try and help with sleep as she states "I haven't slept in 11 months" and  possibly received a new refill of it today, of which she took 11 pills. About an hour later, she  got up from the couch, felt weak and her legs gave out under her. Unclear if she had LOC, but per  her it doesn't seem that she did. No associated symptoms except some possible shortness of  breath, but no chest pain. She states she was in her usual state of health, doing well until this  event. Per report, BP on arrival for EMS was 70's.  In the ED, pt has been hypotensive initially in the 60's for which she was given 3L of NS and  seemed to improve to the 100's, however it dipped again for which a central line was placed and  pt was started on norepinephrine. Labs with hypoK 3.4. Renal 18/1.4 which is up from prior. Trop  POC negative, lactate 2.8, procalcitonin < 0.10. CBC stable. Salicylate and tylenol negative. UA  and UDS negative.   AXIS I Schizophrenia vx. Schizoaffective , chronic Insomnia. Overdose with SGA Seroquel AXIS II Deferred AXIS III Past Medical History  Diagnosis Date  . Severe mitral regurgitation     Minimally invasive vale repair on 12/18/08. Additionally TV repair  and PFO closure.  . CHF  (congestive heart failure)     TEE(6/10): EF 40%  . Microcytic anemia     EGD and colonoscopy in 12/11 did not reveal source of bleeding.Plan capsule endoscopy.  . Gastric ulcer   . Morbid obesity   . Obstructive sleep apnea     on CPAP  . GERD (gastroesophageal reflux disease)   . HTN (hypertension)   . Gout   . Depression   . Colitis 2/11  . SOB (shortness of breath)   . Chronic back pain   . Chronic knee pain   . DVT (deep venous thrombosis)     right leg  . Anxiety   . Rheumatoid arthritis     "shoulders & knees"  . Sleep disturbance     07/14/11 "haven't slept in 10 months; since going to [facility]"  . Hx: UTI (urinary tract infection)   . Nocturia   . COPD (chronic obstructive pulmonary disease)   . Bronchitis   . PTSD (post-traumatic stress disorder)   . Acute-on-chronic respiratory failure   . Schizophrenia     Past Surgical History  Procedure Date  . Cardiac valve replacement 12/2008    cardiac  . Cardiac catheterization 04/20/2009, 11/04/2008  . Right knee arthroscopy 06/11/2004  . Extraction of teeth 12/04/2008  . Right mini thoracotomy for mitral valve repair and closure of [place] 12/18/2008  AXIS IV Family support Poor  adherence to medication/medical regimen Poor impulse control AXIS V  GAF  40  History reviewed. No pertinent family history.  Social History:  reports that she quit smoking about 2 years ago. Her smoking use included Cigarettes. She has a 13.5 pack-year smoking history. She has never used smokeless tobacco. She reports that she drinks alcohol. She reports that she uses illicit drugs ("Crack" cocaine).  Allergies:  Allergies  Allergen Reactions  . Colchicine Itching and Other (See Comments)    Whelps.  . Morphine Itching  . Risperdal Other (See Comments)    Auditory hallucinations.    Medications: I have reviewed the patient's current medications.  Results for orders placed during the hospital encounter of 08/02/11 (from the past 48  hour(s))  POCT I-STAT TROPONIN I     Status: Normal   Collection Time   08/02/11 11:40 PM      Component Value Range Comment   Troponin i, poc 0.00  0.00 - 0.08 (ng/mL)    Comment 3            LACTIC ACID, PLASMA     Status: Abnormal   Collection Time   08/02/11 11:44 PM      Component Value Range Comment   Lactic Acid, Venous 2.8 (*) 0.5 - 2.2 (mmol/L)   MAGNESIUM     Status: Normal   Collection Time   08/02/11 11:44 PM      Component Value Range Comment   Magnesium 1.5  1.5 - 2.5 (mg/dL)   CBC     Status: Abnormal   Collection Time   08/02/11 11:45 PM      Component Value Range Comment   WBC 4.5  4.0 - 10.5 (K/uL)    RBC 4.66  3.87 - 5.11 (MIL/uL)    Hemoglobin 10.6 (*) 12.0 - 15.0 (g/dL)    HCT 78.2 (*) 95.6 - 46.0 (%)    MCV 74.9 (*) 78.0 - 100.0 (fL)    MCH 22.7 (*) 26.0 - 34.0 (pg)    MCHC 30.4  30.0 - 36.0 (g/dL)    RDW 21.3 (*) 08.6 - 15.5 (%)    Platelets 357  150 - 400 (K/uL)   PROCALCITONIN     Status: Normal   Collection Time   08/02/11 11:45 PM      Component Value Range Comment   Procalcitonin <0.10     ACETAMINOPHEN LEVEL     Status: Normal   Collection Time   08/02/11 11:45 PM      Component Value Range Comment   Acetaminophen (Tylenol), Serum <15.0  10 - 30 (ug/mL)   SALICYLATE LEVEL     Status: Abnormal   Collection Time   08/02/11 11:45 PM      Component Value Range Comment   Salicylate Lvl <2.0 (*) 2.8 - 20.0 (mg/dL)   POCT I-STAT, CHEM 8     Status: Abnormal   Collection Time   08/02/11 11:46 PM      Component Value Range Comment   Sodium 142  135 - 145 (mEq/L)    Potassium 3.4 (*) 3.5 - 5.1 (mEq/L)    Chloride 103  96 - 112 (mEq/L)    BUN 18  6 - 23 (mg/dL)    Creatinine, Ser 5.78 (*) 0.50 - 1.10 (mg/dL)    Glucose, Bld 469 (*) 70 - 99 (mg/dL)    Calcium, Ion 6.29  1.12 - 1.32 (mmol/L)    TCO2 27  0 - 100 (mmol/L)    Hemoglobin  12.2  12.0 - 15.0 (g/dL)    HCT 40.9  81.1 - 91.4 (%)   URINE RAPID DRUG SCREEN (HOSP PERFORMED)     Status: Normal    Collection Time   08/02/11 11:54 PM      Component Value Range Comment   Opiates NONE DETECTED  NONE DETECTED     Cocaine NONE DETECTED  NONE DETECTED     Benzodiazepines NONE DETECTED  NONE DETECTED     Amphetamines NONE DETECTED  NONE DETECTED     Tetrahydrocannabinol NONE DETECTED  NONE DETECTED     Barbiturates NONE DETECTED  NONE DETECTED    URINALYSIS, ROUTINE W REFLEX MICROSCOPIC     Status: Abnormal   Collection Time   08/02/11 11:54 PM      Component Value Range Comment   Color, Urine YELLOW  YELLOW     APPearance CLOUDY (*) CLEAR     Specific Gravity, Urine 1.020  1.005 - 1.030     pH 5.0  5.0 - 8.0     Glucose, UA NEGATIVE  NEGATIVE (mg/dL)    Hgb urine dipstick NEGATIVE  NEGATIVE     Bilirubin Urine NEGATIVE  NEGATIVE     Ketones, ur NEGATIVE  NEGATIVE (mg/dL)    Protein, ur NEGATIVE  NEGATIVE (mg/dL)    Urobilinogen, UA 0.2  0.0 - 1.0 (mg/dL)    Nitrite NEGATIVE  NEGATIVE     Leukocytes, UA NEGATIVE  NEGATIVE  MICROSCOPIC NOT DONE ON URINES WITH NEGATIVE PROTEIN, BLOOD, LEUKOCYTES, NITRITE, OR GLUCOSE <1000 mg/dL.  MAGNESIUM     Status: Normal   Collection Time   08/03/11 10:45 AM      Component Value Range Comment   Magnesium 1.5  1.5 - 2.5 (mg/dL)   PHOSPHORUS     Status: Normal   Collection Time   08/03/11 10:45 AM      Component Value Range Comment   Phosphorus 3.4  2.3 - 4.6 (mg/dL)   BASIC METABOLIC PANEL     Status: Abnormal   Collection Time   08/03/11 10:45 AM      Component Value Range Comment   Sodium 141  135 - 145 (mEq/L)    Potassium 3.4 (*) 3.5 - 5.1 (mEq/L)    Chloride 107  96 - 112 (mEq/L)    CO2 28  19 - 32 (mEq/L)    Glucose, Bld 107 (*) 70 - 99 (mg/dL)    BUN 16  6 - 23 (mg/dL)    Creatinine, Ser 7.82 (*) 0.50 - 1.10 (mg/dL)    Calcium 8.2 (*) 8.4 - 10.5 (mg/dL)    GFR calc non Af Amer 49 (*) >90 (mL/min)    GFR calc Af Amer 57 (*) >90 (mL/min)   CBC     Status: Abnormal   Collection Time   08/03/11 10:45 AM      Component Value Range  Comment   WBC 4.9  4.0 - 10.5 (K/uL)    RBC 4.12  3.87 - 5.11 (MIL/uL)    Hemoglobin 9.6 (*) 12.0 - 15.0 (g/dL)    HCT 95.6 (*) 21.3 - 46.0 (%)    MCV 74.0 (*) 78.0 - 100.0 (fL)    MCH 23.3 (*) 26.0 - 34.0 (pg)    MCHC 31.5  30.0 - 36.0 (g/dL)    RDW 08.6 (*) 57.8 - 15.5 (%)    Platelets 324  150 - 400 (K/uL)   CARDIAC PANEL(CRET KIN+CKTOT+MB+TROPI)     Status: Normal  Collection Time   08/03/11 10:45 AM      Component Value Range Comment   Total CK 72  7 - 177 (U/L)    CK, MB 1.8  0.3 - 4.0 (ng/mL)    Troponin I <0.30  <0.30 (ng/mL)    Relative Index RELATIVE INDEX IS INVALID  0.0 - 2.5    MRSA PCR SCREENING     Status: Abnormal   Collection Time   08/03/11 10:49 AM      Component Value Range Comment   MRSA by PCR POSITIVE (*) NEGATIVE    CARDIAC PANEL(CRET KIN+CKTOT+MB+TROPI)     Status: Normal   Collection Time   08/03/11  6:00 PM      Component Value Range Comment   Total CK 87  7 - 177 (U/L)    CK, MB 1.8  0.3 - 4.0 (ng/mL)    Troponin I <0.30  <0.30 (ng/mL)    Relative Index RELATIVE INDEX IS INVALID  0.0 - 2.5      Dg Chest Portable 1 View  08/03/2011  *RADIOLOGY REPORT*  Clinical Data: Drug overdose; central line placement.  PORTABLE CHEST - 1 VIEW  Comparison: Chest radiograph performed 07/15/2011  Findings: The patient's right IJ line is noted ending about the proximal to mid SVC.  The lungs are relatively well expanded.  Vascular congestion is noted, with increased interstitial markings, compatible with mild interstitial edema.  Trace fluid is noted along the right-sided fissures.  No pneumothorax is seen.  Evaluation is mildly suboptimal due to limitations in penetration.  The cardiomediastinal silhouette is mildly enlarged.  No acute osseous abnormalities are identified.  IMPRESSION:  1.  Right IJ line noted ending about the proximal to mid SVC. 2.  Vascular congestion and mild cardiomegaly, with increased interstitial markings, reflecting mild interstitial edema.   Original Report Authenticated By: Tonia Ghent, M.D.    Review of Systems  Unable to perform ROS: other   Blood pressure 97/77, pulse 56, temperature 98.2 F (36.8 C), temperature source Core (Comment), resp. rate 16, height 5\' 2"  (1.575 m), weight 152.6 kg (336 lb 6.8 oz), SpO2 97.00%. Physical Exam  Assessment/Plan  Chart reviewed, Discussed with Psych CSW and Daughter Pt had MV repair ~ 8 months ago.  She blames her wt gain on procedure.  She also c/o inability to sleep 'for 11 months'. She has been depressed but denies suicidal ideation; her daughter says she has been eating and putting on weight since the surgery  Pt says she used to smoke Crack Cocaine; not now. She used to smoke marijuana but not now.  Her main problem is inability to sleep.  She has been in a sleep study and is supposed to use CPAP and does not. RECOMMENDATION 1. Will follow up with Dr. And prior treatment regimen. 2. Discussion to follow to determine depression or limitations to understand treatment plan.     Jamani Bearce 08/03/2011, 8:51 PM

## 2011-08-03 NOTE — ED Notes (Signed)
Norepi increased to 14 mL/hr

## 2011-08-03 NOTE — H&P (Signed)
PCP:   No primary provider on file.   Goes to [facility].  Also has a psychiatrist Dr. Shirlee Latch, cardiology  Chief Complaint:  Seroquel overdose   HPI: 51yoF with h/o schizoaffective d/o vs schizophrenia, systolic HF (25-30%) due to severe MR s/p MV  repair, morbid obesity, RA presents with seroquel overdose, hypotension.   Pt was last admitted 2/28 to 3/7 to cardiology with systolic HF due to medication noncompliance  and drinking >2L of soda a day. She was admitted for diuresis and was down 13 lbs to 338. Cr was  stable at 1.4.   She can answer questions but is not a great historian, so history also gathered from ED staff who  spoke with family when they were here earlier. She apparently has been taking Seroquel for the  past few months to try and help with sleep as she states "I haven't slept in 11 months" and  possibly received a new refill of it today, of which she took 11 pills. About an hour later, she  got up from the couch, felt weak and her legs gave out under her. Unclear if she had LOC, but per  her it doesn't seem that she did. No associated symptoms except some possible shortness of  breath, but no chest pain. She states she was in her usual state of health, doing well until this  event. Per report, BP on arrival for EMS was 70's.   In the ED, pt has been hypotensive initially in the 60's for which she was given 3L of NS and  seemed to improve to the 100's, however it dipped again for which a central line was placed and  pt was started on norepinephrine. Labs with hypoK 3.4. Renal 18/1.4 which is up from prior. Trop  POC negative, lactate 2.8, procalcitonin < 0.10. CBC stable. Salicylate and tylenol negative. UA  and UDS negative.   ROS as above, otherwise negative or unremarkable.   Past Medical History  Diagnosis Date  . Severe mitral regurgitation     Minimally invasive vale repair on 12/18/08. Additionally TV repair  and PFO closure.  . CHF (congestive heart  failure)     TEE(6/10): EF 40%  . Microcytic anemia     EGD and colonoscopy in 12/11 did not reveal source of bleeding.Plan capsule endoscopy.  . Gastric ulcer   . Morbid obesity   . Obstructive sleep apnea     on CPAP  . GERD (gastroesophageal reflux disease)   . HTN (hypertension)   . Gout   . Depression   . Colitis 2/11  . SOB (shortness of breath)   . Chronic back pain   . Chronic knee pain   . DVT (deep venous thrombosis)     right leg  . Anxiety   . Rheumatoid arthritis     "shoulders & knees"  . Sleep disturbance     07/14/11 "haven't slept in 10 months; since going to [facility]"    Past Surgical History  Procedure Date  . Cardiac valve replacement 12/2008    cardiac    Medications:  HOME MEDS: Unable to reconcile  Prior to Admission medications   Medication Sig Start Date End Date Taking? Authorizing Provider  aspirin EC 81 MG tablet Take 81 mg by mouth daily.    Historical Provider, MD  carvedilol (COREG) 12.5 MG tablet Take 12.5 mg by mouth 2 (two) times daily. 07/21/11 07/20/12  Dolores Patty, MD  esomeprazole (NEXIUM) 40 MG capsule Take  40 mg by mouth daily before breakfast.      Historical Provider, MD  isosorbide-hydrALAZINE (BIDIL) 20-37.5 MG per tablet Take 1 tablet by mouth every 8 (eight) hours. 07/21/11 07/20/12  Dolores Patty, MD  losartan (COZAAR) 50 MG tablet Take 50 mg by mouth daily.    Historical Provider, MD  metolazone (ZAROXOLYN) 2.5 MG tablet Take 1 tablet (2.5 mg total) by mouth as needed. 07/21/11 07/20/12  Dolores Patty, MD  naproxen (NAPROSYN) 500 MG tablet Take 1 tablet (500 mg total) by mouth 2 (two) times daily. 07/10/11 07/09/12  Dayton Bailiff, MD  QUEtiapine (SEROQUEL) 100 MG tablet Take 400 mg by mouth at bedtime.     Historical Provider, MD  torsemide (DEMADEX) 20 MG tablet 80 mg (4 tabs) po in the morning and 40 mg (2 tabs) po in the evening 07/28/11   Hadassah Pais, PA    Allergies:  Allergies  Allergen Reactions  . Colchicine  Itching and Other (See Comments)    Whelps.  . Morphine Itching  . Risperdal Other (See Comments)    Auditory hallucinations.    Social History:   reports that she quit smoking about 2 years ago. Her smoking use included Cigarettes. She has a 13.5 pack-year smoking history. She has never used smokeless tobacco. She reports that she drinks alcohol. She reports that she uses illicit drugs ("Crack" cocaine).  Family History: No family history on file.  Physical Exam: Filed Vitals:   08/03/11 0540 08/03/11 0545 08/03/11 0550 08/03/11 0555  BP: 94/41 97/34 99/60  99/50  Pulse: 43 44 45 45  Temp:      TempSrc:      Resp: 15 13 11 12   SpO2: 100% 99% 98% 99%   Blood pressure 99/50, pulse 45, temperature 97.8 F (36.6 C), temperature source Oral, resp. rate 12, SpO2 99.00%.  Gen: Massively obese F who is awoken and appropriate, can answer questions, appears fatigued but  attentive to conversation. No distress, breathing comfortably.  HEENT: Pupils round and reactive, EOMI, sclera clear, mouth moist and normal appearing without  apparent lesions Lungs: CTAB no w/c/r, good air movement, normal exam Heart: S1/2 very difficult to hear, basically inaudible.  Abd: Obese but soft, non tender, non distended, no facial grimacing. Benign Extrem: Warm and perfusing normally, radials soft but palpable, no BLE edema noted Neuro: Alert and conversant, mentally lucid, attentive, CN 2-12 intact, able to move extremities  well and on her own, grossly non-focal.    Labs & Imaging Results for orders placed during the hospital encounter of 08/02/11 (from the past 48 hour(s))  POCT I-STAT TROPONIN I     Status: Normal   Collection Time   08/02/11 11:40 PM      Component Value Range Comment   Troponin i, poc 0.00  0.00 - 0.08 (ng/mL)    Comment 3            LACTIC ACID, PLASMA     Status: Abnormal   Collection Time   08/02/11 11:44 PM      Component Value Range Comment   Lactic Acid, Venous 2.8 (*)  0.5 - 2.2 (mmol/L)   CBC     Status: Abnormal   Collection Time   08/02/11 11:45 PM      Component Value Range Comment   WBC 4.5  4.0 - 10.5 (K/uL)    RBC 4.66  3.87 - 5.11 (MIL/uL)    Hemoglobin 10.6 (*) 12.0 - 15.0 (g/dL)  HCT 34.9 (*) 36.0 - 46.0 (%)    MCV 74.9 (*) 78.0 - 100.0 (fL)    MCH 22.7 (*) 26.0 - 34.0 (pg)    MCHC 30.4  30.0 - 36.0 (g/dL)    RDW 09.8 (*) 11.9 - 15.5 (%)    Platelets 357  150 - 400 (K/uL)   PROCALCITONIN     Status: Normal   Collection Time   08/02/11 11:45 PM      Component Value Range Comment   Procalcitonin <0.10     ACETAMINOPHEN LEVEL     Status: Normal   Collection Time   08/02/11 11:45 PM      Component Value Range Comment   Acetaminophen (Tylenol), Serum <15.0  10 - 30 (ug/mL)   SALICYLATE LEVEL     Status: Abnormal   Collection Time   08/02/11 11:45 PM      Component Value Range Comment   Salicylate Lvl <2.0 (*) 2.8 - 20.0 (mg/dL)   POCT I-STAT, CHEM 8     Status: Abnormal   Collection Time   08/02/11 11:46 PM      Component Value Range Comment   Sodium 142  135 - 145 (mEq/L)    Potassium 3.4 (*) 3.5 - 5.1 (mEq/L)    Chloride 103  96 - 112 (mEq/L)    BUN 18  6 - 23 (mg/dL)    Creatinine, Ser 1.47 (*) 0.50 - 1.10 (mg/dL)    Glucose, Bld 829 (*) 70 - 99 (mg/dL)    Calcium, Ion 5.62  1.12 - 1.32 (mmol/L)    TCO2 27  0 - 100 (mmol/L)    Hemoglobin 12.2  12.0 - 15.0 (g/dL)    HCT 13.0  86.5 - 78.4 (%)   URINE RAPID DRUG SCREEN (HOSP PERFORMED)     Status: Normal   Collection Time   08/02/11 11:54 PM      Component Value Range Comment   Opiates NONE DETECTED  NONE DETECTED     Cocaine NONE DETECTED  NONE DETECTED     Benzodiazepines NONE DETECTED  NONE DETECTED     Amphetamines NONE DETECTED  NONE DETECTED     Tetrahydrocannabinol NONE DETECTED  NONE DETECTED     Barbiturates NONE DETECTED  NONE DETECTED    URINALYSIS, ROUTINE W REFLEX MICROSCOPIC     Status: Abnormal   Collection Time   08/02/11 11:54 PM      Component Value Range  Comment   Color, Urine YELLOW  YELLOW     APPearance CLOUDY (*) CLEAR     Specific Gravity, Urine 1.020  1.005 - 1.030     pH 5.0  5.0 - 8.0     Glucose, UA NEGATIVE  NEGATIVE (mg/dL)    Hgb urine dipstick NEGATIVE  NEGATIVE     Bilirubin Urine NEGATIVE  NEGATIVE     Ketones, ur NEGATIVE  NEGATIVE (mg/dL)    Protein, ur NEGATIVE  NEGATIVE (mg/dL)    Urobilinogen, UA 0.2  0.0 - 1.0 (mg/dL)    Nitrite NEGATIVE  NEGATIVE     Leukocytes, UA NEGATIVE  NEGATIVE  MICROSCOPIC NOT DONE ON URINES WITH NEGATIVE PROTEIN, BLOOD, LEUKOCYTES, NITRITE, OR GLUCOSE <1000 mg/dL.   Dg Chest Portable 1 View  08/03/2011  *RADIOLOGY REPORT*  Clinical Data: Drug overdose; central line placement.  PORTABLE CHEST - 1 VIEW  Comparison: Chest radiograph performed 07/15/2011  Findings: The patient's right IJ line is noted ending about the proximal to mid SVC.  The lungs  are relatively well expanded.  Vascular congestion is noted, with increased interstitial markings, compatible with mild interstitial edema.  Trace fluid is noted along the right-sided fissures.  No pneumothorax is seen.  Evaluation is mildly suboptimal due to limitations in penetration.  The cardiomediastinal silhouette is mildly enlarged.  No acute osseous abnormalities are identified.  IMPRESSION:  1.  Right IJ line noted ending about the proximal to mid SVC. 2.  Vascular congestion and mild cardiomegaly, with increased interstitial markings, reflecting mild interstitial edema.  Original Report Authenticated By: Tonia Ghent, M.D.    ECG: NSR 77 bpm, normal axis, normal P and PR, narrow QRS, no Q waves, no ST deviations, very  diffuse TWF and small TWI in anterolateral and inferior leads. These TWI appear to be new.   Telemetry shows development of bigeminy of sinus beats coupled to PVC's   Impression Present on Admission:  .Overdose of antipsychotic .Hypotension due to drugs .Chronic systolic heart failure .Mitral valve disorders  51yoF with  h/o schizoaffective d/o vs schizophrenia, systolic HF (25-30%) due to severe MR s/p MV  repair, morbid obesity, RA presents with seroquel overdose, hypotension.   1. HypoTN: Likely due to seroquel overdose. Review of EPIC notes shows she was running 140-150's  until this most recent hsoptialization, when she was running 90-100's, so question whether lowish  BP's are her more recent baseline due to poor cardiac function. Regardless, she is mentating well  and is making plenty of urine, no other evidence of end organ damage, so have advised nursing to  try and wean the levophed as reasonably quickly as possible - Wean pressors, keep in Foley and monitor UOP and creatinine, mental status - Holding coreg, torsemide, bidil, metolazone, losartan. Continue ASA 81   2. Seroquel overdose: Per discussion with ED staff who spoke with poison control, plan to monitor  for arrhythmias and QTc, but no specific therapy otherwise. Recommend psych consultation in the  am.  - Holding seroquel. Neuro checks for mental status. Given EF, will hold on further IVF's, is s/p  3L of NS - Trend ECG and cardiac enzymes   3. Acute vs chronic kidney disease: Cr currently 1.4. Cr was 0.7-1.1 for much of 2012, however  appears stably in the 1.2-1.5 range through most of last admission. Not really sure what her true  baseline is as it looks like it varies a lot.    SDU, [facility] team 2 Presumed full code   Other plans as per orders.   Ettore Trebilcock 08/03/2011, 6:16 AM

## 2011-08-03 NOTE — ED Provider Notes (Addendum)
History     CSN: [id number]  Arrival date & time 08/02/11  2306   First MD Initiated Contact with Patient 08/02/11 2317      Chief Complaint  Patient presents with  . Drug Overdose    ingestion of seraquel unknown tabs, 50 mg dosage    (Consider location/radiation/quality/duration/timing/severity/associated sxs/prior treatment) Patient is a 51 y.o. female presenting with Overdose. The history is provided by the patient, a relative and the EMS personnel.  Drug Overdose This is a new problem. Episode onset: Around 10 PM prior to arrival. The problem occurs constantly. The problem has not changed since onset.Pertinent negatives include no chest pain, no abdominal pain, no headaches and no shortness of breath. The symptoms are aggravated by nothing. The symptoms are relieved by nothing. She has tried nothing for the symptoms.   patient is bipolar and recently having difficulty sleeping. She was prescribed Seroquel earlier in the day and went home tonight and took 15 tablets of 100 mg as an intentional overdose because she was "trying to sleep". When asked if she is suicidal, patient responds I was just trying to sleep. She has a history of drug overdose in the past. After taking medication and she told her family which she did and 911 was called. EMS reported on scene blood pressure 70 systolic and somnolent on exam. IVs initiated and IV fluids initiated and patient brought in by EMS. Patient denies any other ingestions. She denies any alcohol or drug use. She denies taking any of her blood pressure medications other than as prescribed. Moderate in severity.  Past Medical History  Diagnosis Date  . Severe mitral regurgitation     Minimally invasive vale repair on 12/18/08. Additionally TV repair  and PFO closure.  . CHF (congestive heart failure)     TEE(6/10): EF 40%  . Microcytic anemia     EGD and colonoscopy in 12/11 did not reveal source of bleeding.Plan capsule endoscopy.  . Gastric  ulcer   . Morbid obesity   . Obstructive sleep apnea     on CPAP  . GERD (gastroesophageal reflux disease)   . HTN (hypertension)   . Gout   . Depression   . Colitis 2/11  . SOB (shortness of breath)   . Chronic back pain   . Chronic knee pain   . DVT (deep venous thrombosis)     right leg  . Anxiety   . Rheumatoid arthritis     "shoulders & knees"  . Sleep disturbance     07/14/11 "haven't slept in 10 months; since going to [facility]"    Past Surgical History  Procedure Date  . Cardiac valve replacement 12/2008    cardiac    No family history on file.  History  Substance Use Topics  . Smoking status: Former Smoker -- 0.5 packs/day for 27 years    Types: Cigarettes    Quit date: 12/18/2008  . Smokeless tobacco: Never Used  . Alcohol Use: Yes     "stopped drinking alcohol 12/2003; did drink ~ 6 shots/wk"    OB History    Grav Para Term Preterm Abortions TAB SAB Ect Mult Living                  Review of Systems  Constitutional: Negative for fever and chills.  HENT: Negative for neck pain and neck stiffness.   Eyes: Negative for pain.  Respiratory: Negative for shortness of breath.   Cardiovascular: Negative for chest pain.  Gastrointestinal:  Negative for abdominal pain.  Genitourinary: Negative for dysuria.  Musculoskeletal: Negative for back pain.  Skin: Negative for rash.  Neurological: Negative for headaches.  Psychiatric/Behavioral: Positive for confusion. Negative for hallucinations.  All other systems reviewed and are negative.    Allergies  Colchicine; Morphine; and Risperdal  Home Medications   Current Outpatient Rx  Name Route Sig Dispense Refill  . ASPIRIN EC 81 MG PO TBEC Oral Take 81 mg by mouth daily.    Marland Kitchen CARVEDILOL 12.5 MG PO TABS Oral Take 12.5 mg by mouth 2 (two) times daily.    Marland Kitchen ESOMEPRAZOLE MAGNESIUM 40 MG PO CPDR Oral Take 40 mg by mouth daily before breakfast.      . ISOSORB DINITRATE-HYDRALAZINE 20-37.5 MG PO TABS Oral Take 1 tablet  by mouth every 8 (eight) hours. 90 tablet 6  . LOSARTAN POTASSIUM 50 MG PO TABS Oral Take 50 mg by mouth daily.    Marland Kitchen METOLAZONE 2.5 MG PO TABS Oral Take 1 tablet (2.5 mg total) by mouth as needed.    Marland Kitchen NAPROXEN 500 MG PO TABS Oral Take 1 tablet (500 mg total) by mouth 2 (two) times daily. 30 tablet 0  . QUETIAPINE FUMARATE 100 MG PO TABS Oral Take 400 mg by mouth at bedtime.     . TORSEMIDE 20 MG PO TABS  80 mg (4 tabs) po in the morning and 40 mg (2 tabs) po in the evening 180 tablet 3    BP 117/63  Pulse 42  Temp(Src) 97.8 F (36.6 C) (Oral)  Resp 12  SpO2 88%  Physical Exam  Constitutional: She appears well-developed and well-nourished.  HENT:  Head: Normocephalic and atraumatic.  Eyes: Conjunctivae and EOM are normal. Pupils are equal, round, and reactive to light.  Neck: Trachea normal. Neck supple. No thyromegaly present.  Cardiovascular: Normal rate, regular rhythm, S1 normal, S2 normal and normal pulses.     No systolic murmur is present   No diastolic murmur is present  Pulses:      Radial pulses are 2+ on the right side, and 2+ on the left side.  Pulmonary/Chest: Effort normal and breath sounds normal. She has no wheezes. She has no rhonchi. She has no rales. She exhibits no tenderness.  Abdominal: Soft. Normal appearance and bowel sounds are normal. There is no tenderness. There is no CVA tenderness and negative Murphy's sign.  Musculoskeletal:       BLE:s Calves nontender, no cords or erythema, negative Homans sign  Neurological: She is alert. She has normal strength. No sensory deficit. Coordination normal. GCS eye subscore is 4. GCS verbal subscore is 5. GCS motor subscore is 6.       Slurred speech and confused  Skin: Skin is warm and dry. No rash noted. She is not diaphoretic.  Psychiatric: Her speech is normal.       Mildly bizarre affect    ED Course  CENTRAL LINE Date/Time: 08/03/2011 3:25 AM Performed by: Sunnie Nielsen Authorized by: Sunnie Nielsen Consent:  Verbal consent obtained. Risks and benefits: risks, benefits and alternatives were discussed Consent given by: patient Patient understanding: patient states understanding of the procedure being performed Patient consent: the patient's understanding of the procedure matches consent given Procedure consent: procedure consent matches procedure scheduled Required items: required blood products, implants, devices, and special equipment available Patient identity confirmed: verbally with patient Time out: Immediately prior to procedure a "time out" was called to verify the correct patient, procedure, equipment, support staff and site/side  marked as required. Indications: vascular access Anesthesia: local infiltration Local anesthetic: lidocaine 1% without epinephrine Anesthetic total: 2 ml Preparation: skin prepped with 2% chlorhexidine Skin prep agent dried: skin prep agent completely dried prior to procedure Sterile barriers: all five maximum sterile barriers used - cap, mask, sterile gown, sterile gloves, and large sterile sheet Hand hygiene: hand hygiene performed prior to central venous catheter insertion Location details: right internal jugular Patient position: Trendelenburg Catheter type: triple lumen Pre-procedure: landmarks identified Ultrasound guidance: yes Number of attempts: 1 Post-procedure: line sutured Assessment: blood return through all parts Patient tolerance: Patient tolerated the procedure well with no immediate complications. Comments: Chest x-ray ordered to verify placement.   (including critical care time)  Labs Reviewed  CBC - Abnormal; Notable for the following:    Hemoglobin 10.6 (*)    HCT 34.9 (*)    MCV 74.9 (*)    MCH 22.7 (*)    RDW 18.2 (*)    All other components within normal limits  LACTIC ACID, PLASMA - Abnormal; Notable for the following:    Lactic Acid, Venous 2.8 (*)    All other components within normal limits  URINALYSIS, ROUTINE W REFLEX  MICROSCOPIC - Abnormal; Notable for the following:    APPearance CLOUDY (*)    All other components within normal limits  SALICYLATE LEVEL - Abnormal; Notable for the following:    Salicylate Lvl <2.0 (*)    All other components within normal limits  POCT I-STAT, CHEM 8 - Abnormal; Notable for the following:    Potassium 3.4 (*)    Creatinine, Ser 1.40 (*)    Glucose, Bld 162 (*)    All other components within normal limits  PROCALCITONIN  URINE RAPID DRUG SCREEN (HOSP PERFORMED)  ACETAMINOPHEN LEVEL  POCT I-STAT TROPONIN I   CRITICAL CARE Performed by: Sunnie Nielsen   Total critical care time: 65  Critical care time was exclusive of separately billable procedures and treating other patients.  Critical care was necessary to treat or prevent imminent or life-threatening deterioration.  Critical care was time spent personally by me on the following activities: development of treatment plan with patient and/or surrogate as well as nursing, discussions with consultants, evaluation of patient's response to treatment, examination of patient, obtaining history from patient or surrogate, ordering and performing treatments and interventions, ordering and review of laboratory studies, ordering and review of radiographic studies, pulse oximetry and re-evaluation of patient's condition. IV fluids for hypotension. 2 IVs established with very careful hydration given the history of CHF. No hypoxia or tachypnea. After third liter of IV fluids blood pressure normalized to systolic greater than 100, pressors ordered but not initiated due to improved condition. Poison control notified. Labs obtained and reviewed as above. Case discussed with Dr. Kaylyn Layer, on call for triad who agrees to evaluation for admit step down ICU.   3:24 AM patient again with hypotension systolic blood pressure in the 80s. I placed a central line as above and initiated pressors.   MDM   Seroquel overdose with hypotension and  somnolence. Improved with aggressive IV fluid hydration. Guarded condition given history of CHF. Will require psychiatric evaluation when stable.  Patient with return of hypotension - Central line placed      Sunnie Nielsen, MD 08/03/11 4540  Sunnie Nielsen, MD 08/03/11 [phone]

## 2011-08-03 NOTE — ED Notes (Signed)
Gold colored chain necklace placed in container and labelled with pt label and placed in belongings bag.

## 2011-08-03 NOTE — Progress Notes (Signed)
UR completed 

## 2011-08-03 NOTE — ED Notes (Signed)
Patty from Poison Control called to check on pt. Gave her an update and went over her current meds, prior ekg's, and lab results. She recommends that a magnesium level be drawn and a repeat ekg be completed due to concerns regarding widening QRS and lengthening QT. This information and what was passed along reported to pts primary care RN, Jesusita Oka.

## 2011-08-03 NOTE — Progress Notes (Signed)
Subjective: NO NEW COMPLAINTS.   Objective: Weight change:   Intake/Output Summary (Last 24 hours) at 08/03/11 1150 Last data filed at 08/03/11 1000  Gross per 24 hour  Intake      0 ml  Output    700 ml  Net   -700 ml    Filed Vitals:   08/03/11 1100  BP: 100/67  Pulse: 43  Temp: 97.5 F (36.4 C)  Resp: 13   On exam she is alert afebrile and comfortable CVS S1S2 Heard Lungs clear Abdomen soft non tender non distended bowel sounds are heard Extremities:no edema  Lab Results: Results for orders placed during the hospital encounter of 08/02/11 (from the past 24 hour(s))  POCT I-STAT TROPONIN I     Status: Normal   Collection Time   08/02/11 11:40 PM      Component Value Range   Troponin i, poc 0.00  0.00 - 0.08 (ng/mL)   Comment 3           LACTIC ACID, PLASMA     Status: Abnormal   Collection Time   08/02/11 11:44 PM      Component Value Range   Lactic Acid, Venous 2.8 (*) 0.5 - 2.2 (mmol/L)  MAGNESIUM     Status: Normal   Collection Time   08/02/11 11:44 PM      Component Value Range   Magnesium 1.5  1.5 - 2.5 (mg/dL)  CBC     Status: Abnormal   Collection Time   08/02/11 11:45 PM      Component Value Range   WBC 4.5  4.0 - 10.5 (K/uL)   RBC 4.66  3.87 - 5.11 (MIL/uL)   Hemoglobin 10.6 (*) 12.0 - 15.0 (g/dL)   HCT 16.1 (*) 09.6 - 46.0 (%)   MCV 74.9 (*) 78.0 - 100.0 (fL)   MCH 22.7 (*) 26.0 - 34.0 (pg)   MCHC 30.4  30.0 - 36.0 (g/dL)   RDW 04.5 (*) 40.9 - 15.5 (%)   Platelets 357  150 - 400 (K/uL)  PROCALCITONIN     Status: Normal   Collection Time   08/02/11 11:45 PM      Component Value Range   Procalcitonin <0.10    ACETAMINOPHEN LEVEL     Status: Normal   Collection Time   08/02/11 11:45 PM      Component Value Range   Acetaminophen (Tylenol), Serum <15.0  10 - 30 (ug/mL)  SALICYLATE LEVEL     Status: Abnormal   Collection Time   08/02/11 11:45 PM      Component Value Range   Salicylate Lvl <2.0 (*) 2.8 - 20.0 (mg/dL)  POCT I-STAT, CHEM 8      Status: Abnormal   Collection Time   08/02/11 11:46 PM      Component Value Range   Sodium 142  135 - 145 (mEq/L)   Potassium 3.4 (*) 3.5 - 5.1 (mEq/L)   Chloride 103  96 - 112 (mEq/L)   BUN 18  6 - 23 (mg/dL)   Creatinine, Ser 8.11 (*) 0.50 - 1.10 (mg/dL)   Glucose, Bld 914 (*) 70 - 99 (mg/dL)   Calcium, Ion 7.82  9.56 - 1.32 (mmol/L)   TCO2 27  0 - 100 (mmol/L)   Hemoglobin 12.2  12.0 - 15.0 (g/dL)   HCT 21.3  08.6 - 57.8 (%)  URINE RAPID DRUG SCREEN (HOSP PERFORMED)     Status: Normal   Collection Time   08/02/11 11:54 PM  Component Value Range   Opiates NONE DETECTED  NONE DETECTED    Cocaine NONE DETECTED  NONE DETECTED    Benzodiazepines NONE DETECTED  NONE DETECTED    Amphetamines NONE DETECTED  NONE DETECTED    Tetrahydrocannabinol NONE DETECTED  NONE DETECTED    Barbiturates NONE DETECTED  NONE DETECTED   URINALYSIS, ROUTINE W REFLEX MICROSCOPIC     Status: Abnormal   Collection Time   08/02/11 11:54 PM      Component Value Range   Color, Urine YELLOW  YELLOW    APPearance CLOUDY (*) CLEAR    Specific Gravity, Urine 1.020  1.005 - 1.030    pH 5.0  5.0 - 8.0    Glucose, UA NEGATIVE  NEGATIVE (mg/dL)   Hgb urine dipstick NEGATIVE  NEGATIVE    Bilirubin Urine NEGATIVE  NEGATIVE    Ketones, ur NEGATIVE  NEGATIVE (mg/dL)   Protein, ur NEGATIVE  NEGATIVE (mg/dL)   Urobilinogen, UA 0.2  0.0 - 1.0 (mg/dL)   Nitrite NEGATIVE  NEGATIVE    Leukocytes, UA NEGATIVE  NEGATIVE   MAGNESIUM     Status: Normal   Collection Time   08/03/11 10:45 AM      Component Value Range   Magnesium 1.5  1.5 - 2.5 (mg/dL)  PHOSPHORUS     Status: Normal   Collection Time   08/03/11 10:45 AM      Component Value Range   Phosphorus 3.4  2.3 - 4.6 (mg/dL)  BASIC METABOLIC PANEL     Status: Abnormal   Collection Time   08/03/11 10:45 AM      Component Value Range   Sodium 141  135 - 145 (mEq/L)   Potassium 3.4 (*) 3.5 - 5.1 (mEq/L)   Chloride 107  96 - 112 (mEq/L)   CO2 28  19 - 32 (mEq/L)    Glucose, Bld 107 (*) 70 - 99 (mg/dL)   BUN 16  6 - 23 (mg/dL)   Creatinine, Ser 4.09 (*) 0.50 - 1.10 (mg/dL)   Calcium 8.2 (*) 8.4 - 10.5 (mg/dL)   GFR calc non Af Amer 49 (*) >90 (mL/min)   GFR calc Af Amer 57 (*) >90 (mL/min)  CBC     Status: Abnormal   Collection Time   08/03/11 10:45 AM      Component Value Range   WBC 4.9  4.0 - 10.5 (K/uL)   RBC 4.12  3.87 - 5.11 (MIL/uL)   Hemoglobin 9.6 (*) 12.0 - 15.0 (g/dL)   HCT 81.1 (*) 91.4 - 46.0 (%)   MCV 74.0 (*) 78.0 - 100.0 (fL)   MCH 23.3 (*) 26.0 - 34.0 (pg)   MCHC 31.5  30.0 - 36.0 (g/dL)   RDW 78.2 (*) 95.6 - 15.5 (%)   Platelets 324  150 - 400 (K/uL)  CARDIAC PANEL(CRET KIN+CKTOT+MB+TROPI)     Status: Normal   Collection Time   08/03/11 10:45 AM      Component Value Range   Total CK 72  7 - 177 (U/L)   CK, MB 1.8  0.3 - 4.0 (ng/mL)   Troponin I <0.30  <0.30 (ng/mL)   Relative Index RELATIVE INDEX IS INVALID  0.0 - 2.5   MRSA PCR SCREENING     Status: Abnormal   Collection Time   08/03/11 10:49 AM      Component Value Range   MRSA by PCR POSITIVE (*) NEGATIVE      Micro Results: No results found for this or  any previous visit (from the past 240 hour(s)).  Studies/Results: Dg Chest Portable 1 View  08/03/2011  *RADIOLOGY REPORT*  Clinical Data: Drug overdose; central line placement.  PORTABLE CHEST - 1 VIEW  Comparison: Chest radiograph performed 07/15/2011  Findings: The patient's right IJ line is noted ending about the proximal to mid SVC.  The lungs are relatively well expanded.  Vascular congestion is noted, with increased interstitial markings, compatible with mild interstitial edema.  Trace fluid is noted along the right-sided fissures.  No pneumothorax is seen.  Evaluation is mildly suboptimal due to limitations in penetration.  The cardiomediastinal silhouette is mildly enlarged.  No acute osseous abnormalities are identified.  IMPRESSION:  1.  Right IJ line noted ending about the proximal to mid SVC. 2.  Vascular  congestion and mild cardiomegaly, with increased interstitial markings, reflecting mild interstitial edema.  Original Report Authenticated By: Tonia Ghent, M.D.   Dg Chest Port 1 View  07/15/2011  *RADIOLOGY REPORT*  Clinical Data: Short of breath and CHF.  Morbid obesity. Hypertension.  PORTABLE CHEST - 1 VIEW  Comparison: 03/01/2010  Findings: Mildly degraded exam due to AP portable technique and patient body habitus.  Numerous leads and wires project over the chest.  Cardiomegaly accentuated by AP portable technique.  No definite pleural effusion. No pneumothorax.  Mild interstitial edema, superimposed upon low lung volumes.  Mild bibasilar atelectasis.  IMPRESSION:  1. Decreased sensitivity and specificity exam due to technique related factors, as described above. 2.  Low lung volumes with mild congestive heart failure.  Original Report Authenticated By: Consuello Bossier, M.D.   Dg Knee Complete 4 Views Left  07/10/2011  *RADIOLOGY REPORT*  Clinical Data: Bilateral knee pain.  LEFT KNEE - COMPLETE 4+ VIEW 07/10/2011:  Comparison: Left knee x-rays 03/17/2009 [facility].  Findings: Mild to moderate medial compartment joint space narrowing and associated mild hypertrophic spurring, unchanged.  Lateral and patellofemoral compartment joint spaces well preserved.  No evidence of acute fracture or dislocation.  Large joint effusion.  IMPRESSION: No acute osseous abnormality.  Mild medial compartment osteoarthritis.  Large joint effusion.  Original Report Authenticated By: Arnell Sieving, M.D.   Dg Knee Complete 4 Views Right  07/10/2011  *RADIOLOGY REPORT*  Clinical Data: Bilateral knee pain.  RIGHT KNEE - COMPLETE 4+ VIEW 07/10/2011:  Comparison: Right knee x-rays 03/17/2009 [facility].  Findings: Tricompartment joint space narrowing and associated hypertrophic spurring.  No evidence of acute fracture or dislocation.  No visible joint effusion.  Focal area of sclerosis in the lateral  tibial plateau on the prior examination is less well seen currently.  IMPRESSION: No acute osseous abnormality.  Osteoarthritis.  Original Report Authenticated By: Arnell Sieving, M.D.   Medications: Scheduled Meds:   . aspirin EC  81 mg Oral Daily  . docusate sodium  100 mg Oral BID  . heparin  5,000 Units Subcutaneous Q8H  . norepinephrine (LEVOPHED) Adult infusion  2-50 mcg/min Intravenous Once  . senna  1 tablet Oral BID  . sodium chloride  1,000 mL Intravenous Once  . sodium chloride  1,000 mL Intravenous Once  . sodium chloride  3 mL Intravenous Q12H  . DISCONTD: ondansetron  4 mg Intravenous Once   Continuous Infusions:   . norepinephrine (LEVOPHED) Adult infusion Stopped (08/03/11 1000)   PRN Meds:.acetaminophen, acetaminophen  Assessment/Plan: Patient Active Hospital Problem List: 1. Hypotension: resolved. Off pressors  2.Anemia: hgb at baseline. 3. Hypokalemia: will be repleted as needed 4. Chronic Systolic  Heart failure: heart failure medications held secondary to hypotension. Will be restarted in am .  5. SEroquel Overdose: Psychiatry consult called.  Will follow EKG  In am.  6. Acute on Chronic CKD: improving.  DVT prophylaxis: scd's.   LOS: 1 day   Harveer Sadler 08/03/2011, 11:50 AM

## 2011-08-04 ENCOUNTER — Other Ambulatory Visit: Payer: Self-pay

## 2011-08-04 ENCOUNTER — Telehealth: Payer: Self-pay | Admitting: *Deleted

## 2011-08-04 LAB — CBC
HCT: 31.7 % — ABNORMAL LOW (ref 36.0–46.0)
Hemoglobin: 9.9 g/dL — ABNORMAL LOW (ref 12.0–15.0)
MCH: 23.4 pg — ABNORMAL LOW (ref 26.0–34.0)
MCHC: 31.2 g/dL (ref 30.0–36.0)

## 2011-08-04 LAB — BASIC METABOLIC PANEL
BUN: 14 mg/dL (ref 6–23)
Calcium: 8.9 mg/dL (ref 8.4–10.5)
GFR calc non Af Amer: 79 mL/min — ABNORMAL LOW (ref >90)
Glucose, Bld: 111 mg/dL — ABNORMAL HIGH (ref 70–99)

## 2011-08-04 MED ORDER — METOLAZONE 2.5 MG PO TABS
2.5000 mg | ORAL_TABLET | Freq: Every day | ORAL | Status: DC
  Administered 2011-08-04 – 2011-08-06 (×3): 2.5 mg via ORAL
  Filled 2011-08-04 (×4): qty 1

## 2011-08-04 MED ORDER — CARVEDILOL 3.125 MG PO TABS
3.1250 mg | ORAL_TABLET | Freq: Two times a day (BID) | ORAL | Status: DC
  Administered 2011-08-04 – 2011-08-05 (×3): 3.125 mg via ORAL
  Filled 2011-08-04 (×4): qty 1

## 2011-08-04 MED ORDER — TORSEMIDE 20 MG PO TABS
20.0000 mg | ORAL_TABLET | Freq: Two times a day (BID) | ORAL | Status: DC
  Administered 2011-08-04 – 2011-08-05 (×2): 20 mg via ORAL
  Filled 2011-08-04 (×3): qty 1

## 2011-08-04 MED ORDER — HYDRALAZINE HCL 25 MG PO TABS
25.0000 mg | ORAL_TABLET | Freq: Three times a day (TID) | ORAL | Status: DC
  Administered 2011-08-04 – 2011-08-06 (×6): 25 mg via ORAL
  Filled 2011-08-04 (×11): qty 1

## 2011-08-04 MED ORDER — ISOSORBIDE DINITRATE 20 MG PO TABS
20.0000 mg | ORAL_TABLET | Freq: Two times a day (BID) | ORAL | Status: DC
  Administered 2011-08-04 – 2011-08-06 (×4): 20 mg via ORAL
  Filled 2011-08-04 (×7): qty 1

## 2011-08-04 NOTE — Telephone Encounter (Signed)
Left message for pt to call, received fax from [employer] regarding the pts bidil. bidil is not on the pts formulary, they want the pt to have generic. Per amy clegg np the pt will need isosorbide dinitrate 20 mg every 8 hours and hydralazine 37.5 mg every 8 hours.

## 2011-08-04 NOTE — Progress Notes (Signed)
Subjective: Comfortable. No new complaints.   Objective: Weight change:   Intake/Output Summary (Last 24 hours) at 08/04/11 1334 Last data filed at 08/04/11 1317  Gross per 24 hour  Intake  16109 ml  Output   1210 ml  Net  23500 ml    Filed Vitals:   08/04/11 1300  BP: 125/90  Pulse: 82  Temp: 97.9 F (36.6 C)  Resp: 15   On exam she is alert afebrile and comfortable  CVS S1S2 Heard  Lungs clear  Abdomen soft non tender non distended bowel sounds are heard  Extremities:no edema   Lab Results: Results for orders placed during the hospital encounter of 08/02/11 (from the past 24 hour(s))  CBC     Status: Abnormal   Collection Time   08/04/11  6:10 AM      Component Value Range   WBC 3.0 (*) 4.0 - 10.5 (K/uL)   RBC 4.23  3.87 - 5.11 (MIL/uL)   Hemoglobin 9.9 (*) 12.0 - 15.0 (g/dL)   HCT 60.4 (*) 54.0 - 46.0 (%)   MCV 74.9 (*) 78.0 - 100.0 (fL)   MCH 23.4 (*) 26.0 - 34.0 (pg)   MCHC 31.2  30.0 - 36.0 (g/dL)   RDW 98.1 (*) 19.1 - 15.5 (%)   Platelets 342  150 - 400 (K/uL)  BASIC METABOLIC PANEL     Status: Abnormal   Collection Time   08/04/11  6:10 AM      Component Value Range   Sodium 140  135 - 145 (mEq/L)   Potassium 4.0  3.5 - 5.1 (mEq/L)   Chloride 107  96 - 112 (mEq/L)   CO2 28  19 - 32 (mEq/L)   Glucose, Bld 111 (*) 70 - 99 (mg/dL)   BUN 14  6 - 23 (mg/dL)   Creatinine, Ser 4.78  0.50 - 1.10 (mg/dL)   Calcium 8.9  8.4 - 29.5 (mg/dL)   GFR calc non Af Amer 79 (*) >90 (mL/min)   GFR calc Af Amer >90  >90 (mL/min)  MAGNESIUM     Status: Normal   Collection Time   08/04/11  6:10 AM      Component Value Range   Magnesium 1.8  1.5 - 2.5 (mg/dL)  GLUCOSE, CAPILLARY     Status: Abnormal   Collection Time   08/04/11  7:49 AM      Component Value Range   Glucose-Capillary 122 (*) 70 - 99 (mg/dL)     Micro Results: Recent Results (from the past 240 hour(s))  MRSA PCR SCREENING     Status: Abnormal   Collection Time   08/03/11 10:49 AM      Component  Value Range Status Comment   MRSA by PCR POSITIVE (*) NEGATIVE  Final     Studies/Results: Dg Chest Portable 1 View  08/03/2011  *RADIOLOGY REPORT*  Clinical Data: Drug overdose; central line placement.  PORTABLE CHEST - 1 VIEW  Comparison: Chest radiograph performed 07/15/2011  Findings: The patient's right IJ line is noted ending about the proximal to mid SVC.  The lungs are relatively well expanded.  Vascular congestion is noted, with increased interstitial markings, compatible with mild interstitial edema.  Trace fluid is noted along the right-sided fissures.  No pneumothorax is seen.  Evaluation is mildly suboptimal due to limitations in penetration.  The cardiomediastinal silhouette is mildly enlarged.  No acute osseous abnormalities are identified.  IMPRESSION:  1.  Right IJ line noted ending about the proximal to mid  SVC. 2.  Vascular congestion and mild cardiomegaly, with increased interstitial markings, reflecting mild interstitial edema.  Original Report Authenticated By: Tonia Ghent, M.D.   Dg Chest Port 1 View  07/15/2011  *RADIOLOGY REPORT*  Clinical Data: Short of breath and CHF.  Morbid obesity. Hypertension.  PORTABLE CHEST - 1 VIEW  Comparison: 03/01/2010  Findings: Mildly degraded exam due to AP portable technique and patient body habitus.  Numerous leads and wires project over the chest.  Cardiomegaly accentuated by AP portable technique.  No definite pleural effusion. No pneumothorax.  Mild interstitial edema, superimposed upon low lung volumes.  Mild bibasilar atelectasis.  IMPRESSION:  1. Decreased sensitivity and specificity exam due to technique related factors, as described above. 2.  Low lung volumes with mild congestive heart failure.  Original Report Authenticated By: Consuello Bossier, M.D.   Dg Knee Complete 4 Views Left  07/10/2011  *RADIOLOGY REPORT*  Clinical Data: Bilateral knee pain.  LEFT KNEE - COMPLETE 4+ VIEW 07/10/2011:  Comparison: Left knee x-rays 03/17/2009 [facility].  Findings: Mild to moderate medial compartment joint space narrowing and associated mild hypertrophic spurring, unchanged.  Lateral and patellofemoral compartment joint spaces well preserved.  No evidence of acute fracture or dislocation.  Large joint effusion.  IMPRESSION: No acute osseous abnormality.  Mild medial compartment osteoarthritis.  Large joint effusion.  Original Report Authenticated By: Arnell Sieving, M.D.   Dg Knee Complete 4 Views Right  07/10/2011  *RADIOLOGY REPORT*  Clinical Data: Bilateral knee pain.  RIGHT KNEE - COMPLETE 4+ VIEW 07/10/2011:  Comparison: Right knee x-rays 03/17/2009 [facility].  Findings: Tricompartment joint space narrowing and associated hypertrophic spurring.  No evidence of acute fracture or dislocation.  No visible joint effusion.  Focal area of sclerosis in the lateral tibial plateau on the prior examination is less well seen currently.  IMPRESSION: No acute osseous abnormality.  Osteoarthritis.  Original Report Authenticated By: Arnell Sieving, M.D.   Medications: Scheduled Meds:   . aspirin EC  81 mg Oral Daily  . carvedilol  3.125 mg Oral BID WC  . Chlorhexidine Gluconate Cloth  6 each Topical Q0600  . docusate sodium  100 mg Oral BID  . heparin  5,000 Units Subcutaneous Q8H  . metolazone  2.5 mg Oral Daily  . mupirocin ointment  1 application Nasal BID  . potassium chloride  40 mEq Oral BID  . senna  1 tablet Oral BID  . sodium chloride  3 mL Intravenous Q12H   Continuous Infusions:   . DISCONTD: norepinephrine (LEVOPHED) Adult infusion Stopped (08/03/11 1000)   PRN Meds:.acetaminophen, acetaminophen  Assessment/Plan: Patient Active Hospital Problem List: 1. Hypotension: resolved. Off pressors, restarted home heart failure medications.  2.Anemia: hgb at baseline.  3. Hypokalemia: will be repleted as needed  4. Chronic Systolic Heart failure:medications Will be restarted today.  5. SEroquel Overdose:  Psychiatry consult called. Will follow EKG In am.  6. Acute on Chronic CKD: improving.  DVT prophylaxis: scd's   LOS: 2 days   Cashis Rill 08/04/2011, 1:34 PM

## 2011-08-05 MED ORDER — TRAZODONE HCL 150 MG PO TABS
150.0000 mg | ORAL_TABLET | Freq: Every day | ORAL | Status: DC
  Administered 2011-08-05: 150 mg via ORAL
  Filled 2011-08-05 (×3): qty 1

## 2011-08-05 MED ORDER — CARVEDILOL 12.5 MG PO TABS
12.5000 mg | ORAL_TABLET | Freq: Two times a day (BID) | ORAL | Status: DC
Start: 2011-08-05 — End: 2011-08-06
  Administered 2011-08-05 – 2011-08-06 (×2): 12.5 mg via ORAL
  Filled 2011-08-05 (×5): qty 1

## 2011-08-05 MED ORDER — LOSARTAN POTASSIUM 50 MG PO TABS
50.0000 mg | ORAL_TABLET | Freq: Every day | ORAL | Status: DC
  Administered 2011-08-05 – 2011-08-06 (×2): 50 mg via ORAL
  Filled 2011-08-05 (×3): qty 1

## 2011-08-05 MED ORDER — TORSEMIDE 20 MG PO TABS
40.0000 mg | ORAL_TABLET | Freq: Two times a day (BID) | ORAL | Status: DC
  Administered 2011-08-05 – 2011-08-06 (×2): 40 mg via ORAL
  Filled 2011-08-05 (×5): qty 2

## 2011-08-05 NOTE — Progress Notes (Signed)
Spoke with MD.  MD requesting that psych visit with Pt and make recommendations with regard to Pt's insomnia.  CSW to notify psych MD.  Providence Crosby, LCSWA Clinical Social Work [phone]

## 2011-08-05 NOTE — Progress Notes (Addendum)
MD waiting to d/c Pt until outpt appts have been scheduled.  Spoke with Pt.  Pt states that she is followed by Dr. Fredric Mare, psychiatrist at [facility].  Pt gave CSW permission to schedule an appt with Dr. Fredric Mare on her behalf.  Attempted to schedule an appt at [facility] for Pt with Dr. Fredric Mare.  Instructed to leave a message for [facility], after-care specialists.  LM for [employer].  Notified MD.  CSW to continue to follow.  Providence Crosby, LCSWA Clinical Social Work [phone]

## 2011-08-05 NOTE — Progress Notes (Signed)
Subjective: Insomnia.  Objective: Weight change:   Intake/Output Summary (Last 24 hours) at 08/05/11 1856 Last data filed at 08/05/11 1610  Gross per 24 hour  Intake    240 ml  Output      0 ml  Net    240 ml    Filed Vitals:   08/05/11 1450  BP: 139/74  Pulse: 102  Temp: 97.5 F (36.4 C)  Resp: 20   On exam she is alert afebrile and comfortable  CVS S1S2 Heard  Lungs clear  Abdomen soft non tender non distended bowel sounds are heard  Extremities:no edema   Lab Results: Results for orders placed during the hospital encounter of 08/02/11 (from the past 24 hour(s))  GLUCOSE, CAPILLARY     Status: Abnormal   Collection Time   08/05/11  7:39 AM      Component Value Range   Glucose-Capillary 109 (*) 70 - 99 (mg/dL)   Comment 1 Notify RN      Micro Results: Recent Results (from the past 240 hour(s))  MRSA PCR SCREENING     Status: Abnormal   Collection Time   08/03/11 10:49 AM      Component Value Range Status Comment   MRSA by PCR POSITIVE (*) NEGATIVE  Final     Studies/Results: Dg Chest Portable 1 View  08/03/2011  *RADIOLOGY REPORT*  Clinical Data: Drug overdose; central line placement.  PORTABLE CHEST - 1 VIEW  Comparison: Chest radiograph performed 07/15/2011  Findings: The patient's right IJ line is noted ending about the proximal to mid SVC.  The lungs are relatively well expanded.  Vascular congestion is noted, with increased interstitial markings, compatible with mild interstitial edema.  Trace fluid is noted along the right-sided fissures.  No pneumothorax is seen.  Evaluation is mildly suboptimal due to limitations in penetration.  The cardiomediastinal silhouette is mildly enlarged.  No acute osseous abnormalities are identified.  IMPRESSION:  1.  Right IJ line noted ending about the proximal to mid SVC. 2.  Vascular congestion and mild cardiomegaly, with increased interstitial markings, reflecting mild interstitial edema.  Original Report Authenticated By:  Tonia Ghent, M.D.   Dg Chest Port 1 View  07/15/2011  *RADIOLOGY REPORT*  Clinical Data: Short of breath and CHF.  Morbid obesity. Hypertension.  PORTABLE CHEST - 1 VIEW  Comparison: 03/01/2010  Findings: Mildly degraded exam due to AP portable technique and patient body habitus.  Numerous leads and wires project over the chest.  Cardiomegaly accentuated by AP portable technique.  No definite pleural effusion. No pneumothorax.  Mild interstitial edema, superimposed upon low lung volumes.  Mild bibasilar atelectasis.  IMPRESSION:  1. Decreased sensitivity and specificity exam due to technique related factors, as described above. 2.  Low lung volumes with mild congestive heart failure.  Original Report Authenticated By: Consuello Bossier, M.D.   Dg Knee Complete 4 Views Left  07/10/2011  *RADIOLOGY REPORT*  Clinical Data: Bilateral knee pain.  LEFT KNEE - COMPLETE 4+ VIEW 07/10/2011:  Comparison: Left knee x-rays 03/17/2009 [facility].  Findings: Mild to moderate medial compartment joint space narrowing and associated mild hypertrophic spurring, unchanged.  Lateral and patellofemoral compartment joint spaces well preserved.  No evidence of acute fracture or dislocation.  Large joint effusion.  IMPRESSION: No acute osseous abnormality.  Mild medial compartment osteoarthritis.  Large joint effusion.  Original Report Authenticated By: Arnell Sieving, M.D.   Dg Knee Complete 4 Views Right  07/10/2011  *RADIOLOGY REPORT*  Clinical  Data: Bilateral knee pain.  RIGHT KNEE - COMPLETE 4+ VIEW 07/10/2011:  Comparison: Right knee x-rays 03/17/2009 [facility].  Findings: Tricompartment joint space narrowing and associated hypertrophic spurring.  No evidence of acute fracture or dislocation.  No visible joint effusion.  Focal area of sclerosis in the lateral tibial plateau on the prior examination is less well seen currently.  IMPRESSION: No acute osseous abnormality.  Osteoarthritis.  Original Report  Authenticated By: Arnell Sieving, M.D.   Medications: Scheduled Meds:   . aspirin EC  81 mg Oral Daily  . carvedilol  12.5 mg Oral BID WC  . Chlorhexidine Gluconate Cloth  6 each Topical Q0600  . docusate sodium  100 mg Oral BID  . heparin  5,000 Units Subcutaneous Q8H  . hydrALAZINE  25 mg Oral Q8H  . isosorbide dinitrate  20 mg Oral BID  . losartan  50 mg Oral Daily  . metolazone  2.5 mg Oral Daily  . mupirocin ointment  1 application Nasal BID  . senna  1 tablet Oral BID  . sodium chloride  3 mL Intravenous Q12H  . torsemide  40 mg Oral BID  . traZODone  150 mg Oral QHS  . DISCONTD: carvedilol  3.125 mg Oral BID WC  . DISCONTD: torsemide  20 mg Oral BID   Continuous Infusions:  PRN Meds:.acetaminophen, acetaminophen  Assessment/Plan: Patient Active Hospital Problem List:  1. Hypotension: resolved. Off pressors, restarted home heart failure medications.  2.Anemia: hgb at baseline.  3. Hypokalemia: will be repleted as needed  4. Chronic Systolic Heart failure:medications Will be restarted today.  5. SEroquel Overdose: pt took the seroquel tablets in an attempt to sleep because she reports she has insomnia for 11 months. CAlled psych for further recommendations. As per Dr Ferol Luz, she might require outpatient Awake EEG and further work up of narcolepsy.  Will start her on trazodone and get an EKG in am. And possibly discharge in am.  6. Acute on Chronic CKD: improving.  DVT prophylaxis: scd's   LOS: 3 days   [name] 08/05/2011, 6:56 PM

## 2011-08-05 NOTE — Progress Notes (Signed)
   CARE MANAGEMENT NOTE 08/05/2011  Patient:  [name], [name]   Account Number:  [id number]  Date Initiated:  08/05/2011  Documentation initiated by:  [name]  Subjective/Objective Assessment:   pt adm OD     Action/Plan:   pt lives with family--- ACTIVE WITH HHC RN   Anticipated DC Date:  08/05/2011   Anticipated DC Plan:  HOME W HOME HEALTH SERVICES      DC Planning Services  CM consult      [facility] Choice  Resumption Of Svcs/PTA Provider   Choice offered to / List presented to:  NA        HH arranged  HH-1 RN      [facility] agency  [facility]   Status of service:  In process, will continue to follow Medicare Important Message given?   (If response is "NO", the following Medicare IM given date fields will be blank) Date Medicare IM given:   Date Additional Medicare IM given:    Discharge Disposition:  HOME W HOME HEALTH SERVICES  Per UR Regulation:  Reviewed for med. necessity/level of care/duration of stay  If discussed at Long Length of Stay Meetings, dates discussed:    Comments:  08-05-11 [name], RN,BSN,CM [phone] ACTIVE WITH [facility] FOR RN SERVICES. LIVES WITH FAMILY. [name] AWARE WITH [facility] OF PATIENT LIKELY DC SOON.

## 2011-08-06 LAB — GLUCOSE, CAPILLARY

## 2011-08-06 MED ORDER — TRAZODONE HCL 150 MG PO TABS: 150.0000 mg | ORAL_TABLET | Freq: Every day | ORAL | Status: AC

## 2011-08-06 NOTE — Progress Notes (Signed)
Gave pt discharge instructions and explained them to her. Pt verbalized understanding of instructions given. Pt left via wheelchair with tech in no acute distress. 

## 2011-08-08 ENCOUNTER — Encounter: Payer: Self-pay | Admitting: Cardiology

## 2011-08-08 ENCOUNTER — Ambulatory Visit (INDEPENDENT_AMBULATORY_CARE_PROVIDER_SITE_OTHER): Payer: Medicare Other | Admitting: Cardiology

## 2011-08-08 VITALS — BP 140/82 | HR 64 | Ht 67.0 in | Wt 337.0 lb

## 2011-08-08 DIAGNOSIS — R0602 Shortness of breath: Secondary | ICD-10-CM

## 2011-08-08 DIAGNOSIS — I059 Rheumatic mitral valve disease, unspecified: Secondary | ICD-10-CM

## 2011-08-08 DIAGNOSIS — I5022 Chronic systolic (congestive) heart failure: Secondary | ICD-10-CM

## 2011-08-08 MED ORDER — ISOSORB DINITRATE-HYDRALAZINE 20-37.5 MG PO TABS: 1.0000 | ORAL_TABLET | Freq: Three times a day (TID) | ORAL | Status: DC

## 2011-08-08 NOTE — Progress Notes (Signed)
PCP: [facility]  51 yo with systolic CHF probably secondary to severe MR now s/p MV repair, rheumatoid arthritis, schizophrenia, and PUD presents for followup.  Last echo in 3/13 showed EF 25-30% with no MR and no significant mitral stenosis.  Patient was seen in CHF clinic in 2/13 and admitted because of volume overload.  She was diuresed and discharged on torsemide 80 qam, 40 qpm.  She was re-admitted in 3/13 after overdosing on Seroquel with hypotension.  She says she has been unable to sleep for "months" and took the extra Seroquel to try to sleep.  She is now off Seroquel and on trazodone.  She says she still cannot sleep.  She is unsure about some of her medication doses.  She is taking losartan 100 mg daily (discharged from hospital apparently on 50 mg daily).  She is only taking Bidil bid.  She has two Coreg bottles.  Sometimes she takes 12.5 mg Coreg, sometimes she takes 6.25 mg Coreg.   At baseline, she is inactive due to knee pain bilaterally.  Weight is down 5 lbs since last appointment.  She is now walking on flat ground without significant dyspnea (though not walking far with knee pain).  She is short of breath with inclines or stairs.  She has stable orthopnea - requires 2 large pillows when she lies down.  No chest pain.    Labs (12/23/09) K 4.4, creatinine 1.1, BNP 145  Labs (9/11): K 3.9, creatinine 1.0, BNP 84, LFTs normal, HCT 29.3  Labs (10/11): K 4.0, creatinine 1.55 => 1.2, uric acid 13, TSH normal, BNP 75 => 172  Labs (11/11): BNP 110 => 105, creatinine 3.6 => 1.2 => 1.5, K 3.7  Labs (1/12): K 3.5, creatinine 1.2, BNP 70  Labs (2/12): LDL 105, HDL 39 Labs (6/12): 3.7, creatinine 1.12 Labs (11/12): K 4.8, creatinine 0.85, BNP 88 Labs (12/12): K 3.9, creatinine 1.1, BNP 32 Labs (3/13): K 4, creatinine 0.85  Allergies:  1) ! * Colchicine  2) ! Morphine   Past History:  1. Congestive heart failure, most likely secondary to severe mitral regurgitation.  TEE (6/10): EF 40%, diffuse hypokinesis, mild to moderately dilated LV, severe eccentric MR, small PFO. Cardiac MRI (6/10): EF 37% with global hypokinesis, moderate to severe MR, no delayed enhancement (no evidence for sarcoidosis or other infiltrative disease). TTE (10/10) after MV repair: EF 25-30%, moderately dilated LV, moderate diastolic dysfunction, mildly depressed RV function, trivial MR, MV mean gradient 5 mmHg, PASP 30 mmHg. RHC (12/10): mean RA 19, PA 46/26, mean PCWP 28, CI 2.5, SVO2 63%. TTE (2/11): EF 35-40% with diffuse hypokinesis, no significant MR or MS. TTE (7/11): EF 45%, mild global hypokinesis, s/p MV repair, no mitral regurgitation, minimal mitral stenosis, PA systolic pressure 40 mmHg, mild LV dilation.  TTE (11/12): EF 30-35%, mild LV dilation, mild LVH, s/p MV repair with no regurgitation and minimal stenosis.  TTE (3/13): EF 25-30%, diffuse hypokinesis, no significant mitral stenosis by pressure halftime with mean gradient 7 mmHg across valve, no MR.  2. Severe mitral regurgitation (see above). Minimally invasive mitral valve repair on 12/18/08. She additionally had TV repair and PFO closure.  3. Microcytic anemia: EGD and colonoscopy in 12/11 did not reveal source of bleeding. Plan capsule endoscopy.  4. Gastric ulcers.  5. Morbid obesity.  6. Obstructive sleep apnea, on CPAP.  7. GERD.  8. Hypertension, controlled.  9. Rheumatoid arthritis  10. Gout.  11. Depression.  12. LHC (6/10): No  angiographic CAD. Mildly dilated LV. 3+ MR. EF 40%.  13. Prior smoking, quit  14. Colitis (2/11)  15. Schizophrenia versus schizoaffective disorder: on Risperdal  16. History of right leg DVT   Family History:  Negative for cardiac or renal disease.  No FH of Colon Cancer  Social History:  Single, 5 children. Unemployed.  Tobacco Use - Yes. Smoked < 1 ppd, quit 6/10.  Alcohol Use - no  Regular Exercise - no  Drug Use - no, hx crack-cocaine use  Daily Caffeine Use: 2 daily    Review of Systems  All systems reviewed and negative except as per HPI.  Current Outpatient Prescriptions  Medication Sig Dispense Refill  . aspirin EC 81 MG tablet Take 81 mg by mouth daily.      . carvedilol (COREG) 12.5 MG tablet Take 12.5 mg by mouth 2 (two) times daily.      Marland Kitchen esomeprazole (NEXIUM) 40 MG capsule Take 40 mg by mouth daily before breakfast.        . isosorbide-hydrALAZINE (BIDIL) 20-37.5 MG per tablet Take 1 tablet by mouth every 8 (eight) hours.  90 tablet  6  . metolazone (ZAROXOLYN) 2.5 MG tablet Take 1 tablet (2.5 mg total) by mouth as needed.      . torsemide (DEMADEX) 20 MG tablet 80 mg (4 tabs) po in the morning and 40 mg (2 tabs) po in the evening  180 tablet  3  . traZODone (DESYREL) 150 MG tablet Take 1 tablet (150 mg total) by mouth at bedtime.  30 tablet  0  . losartan (COZAAR) 100 MG tablet Take 1 tablet (100 mg total) by mouth daily.        BP 140/82  Pulse 64  Ht 5\' 7"  (1.702 m)  Wt 337 lb (152.862 kg)  BMI 52.78 kg/m2 General: NAD, obese.  Neck: JVP 7-8 cm, no thyromegaly or thyroid nodule.  Lungs: Clear to auscultation bilaterally with normal respiratory effort. CV: Nondisplaced PMI.  Heart regular S1/S2, no S3/S4, no murmur.  Trace ankle edema bilaterally.  No carotid bruit.  Normal pedal pulses.  Abdomen: Soft, nontender, no hepatosplenomegaly, no distention.  Neurologic: Alert and oriented x 3.  Psych: Normal affect. Extremities: No clubbing or cyanosis.

## 2011-08-08 NOTE — Assessment & Plan Note (Signed)
NYHA class III symptoms but stable.  Nonischemic cardiomyopathy.  Weight is down and she does not appear significantly volume overloaded.  She has some confusion about her medications.  - Continue to take losartan at 100 mg daily.  - Take Coreg 12.5 mg bid (I disposed of the 6.25 mg bid bottle).  - She will take Bidil tid rather than bid.   - Continue current torsemide dosing.   - Labs this month looked ok, will get BMET/BNP when she follows up with me in 2 months. - Going to CHF clinic in 2/13.  - Will get echo in 6/13; if EF still low, will be > 6 months of medical therapy in setting of EF < 35%, so will refer for ICD.

## 2011-08-08 NOTE — Assessment & Plan Note (Signed)
Patient is s/p MV repair.  No MR and only minimal MS on last echo this month.

## 2011-08-08 NOTE — Progress Notes (Signed)
WEEKEND CASE MANAGER F/U:  08/08/11 Onnie Boer, RN, BSN 1053 PT WAS DC'D ON SAT TO HOME AND NEEDS HH PT AND AN AIDE.  PT HAS HAD [facility] IN THE PAST AND WOULD LIKE TO CONT WITH THEM.  REFERRAL MADE.

## 2011-08-08 NOTE — Patient Instructions (Signed)
Take Bidil 20/37.5mg  every 8 hours (three times a day) instead of two times a day.  Take coreg(carvedilol) 12.5mg  twice a day.  Schedule an appointment in the Heart Failure Clinic at [facility] in April 2013.  Call home health so they can help you with your medication.  Call your psychiatrist about something to help you sleep.  Your physician recommends that you schedule a follow-up appointment in: 2 months with Dr Shirlee Latch.  Your physician recommends that you return for lab work in: 2 months when you see Dr Armida Sans

## 2011-08-10 MED ORDER — ISOSORBIDE DINITRATE 20 MG PO TABS: 20.0000 mg | ORAL_TABLET | Freq: Three times a day (TID) | ORAL | Status: DC

## 2011-08-10 MED ORDER — HYDRALAZINE HCL 25 MG PO TABS: ORAL_TABLET | ORAL | Status: DC

## 2011-08-10 NOTE — Telephone Encounter (Signed)
Spoke with pt, aware of med change. 

## 2011-08-15 ENCOUNTER — Ambulatory Visit (HOSPITAL_COMMUNITY)
Admission: RE | Admit: 2011-08-15 | Discharge: 2011-08-15 | Disposition: A | Discharge status: Home / Self Care | Payer: Medicare Other | Source: Ambulatory Visit | Attending: Internal Medicine | Admitting: Internal Medicine

## 2011-08-15 VITALS — BP 112/54 | HR 98 | Wt 343.5 lb

## 2011-08-15 DIAGNOSIS — I5022 Chronic systolic (congestive) heart failure: Secondary | ICD-10-CM | POA: Insufficient documentation

## 2011-08-15 MED ORDER — ISOSORBIDE DINITRATE 20 MG PO TABS: 40.0000 mg | ORAL_TABLET | Freq: Three times a day (TID) | ORAL | Status: DC

## 2011-08-15 MED ORDER — HYDRALAZINE HCL 25 MG PO TABS: 75.0000 mg | ORAL_TABLET | Freq: Three times a day (TID) | ORAL | Status: DC

## 2011-08-15 NOTE — Patient Instructions (Addendum)
Stop BIDIL  Take Hydralazine 75 mg three times a day  Isordil 40 mg three times a day   Follow up in 1 month  Do the following things EVERYDAY: 1) Weigh yourself in the morning before breakfast. Write it down and keep it in a log. 2) Take your medicines as prescribed 3) Eat low salt foods--Limit salt (sodium) to 2000mg  per day.  4) Stay as active as you can everyday

## 2011-08-15 NOTE — Assessment & Plan Note (Addendum)
Volume status is stable/improved. Adjusted medications because she continued to take BIDIL, hydralazine, IMDUR. Stop BIDIL Increase hydralazine 75 mg tid and isordil 40 mg TID.   Encouraged to resume CPAP however she does not want to remove CPAP from storage. Continue to re-educate fluid restriction, daily weights, and sodium restriction. Will continue to be a challenge as insight into HF is limited.  Follow up in 1 month.

## 2011-08-16 MED ORDER — HYDRALAZINE HCL 25 MG PO TABS: 75.0000 mg | ORAL_TABLET | Freq: Three times a day (TID) | ORAL | Status: DC

## 2011-08-16 NOTE — Discharge Summary (Signed)
DISCHARGE SUMMARY  [name]  MR#: [id number]  DOB:Dec 03, 1960  Date of Admission: 08/02/2011 Date of Discharge: 08/06/2011  Attending Physician:Geri Hepler  Patient's PCP: [facility]  Consults: -Psychiatry consult  Discharge Diagnoses: Present on Admission:  .Overdose of antipsychotic .Hypotension due to drugs .Mitral valve disorders Insomnia chronic systolic heart failure   Initial presentation: 51yoF with h/o schizoaffective d/o vs schizophrenia, systolic HF (25-30%) due to severe MR s/p MV  repair, morbid obesity, RA presents with seroquel overdose, hypotension. She was initially admitted to step down for closer monitoring and for further evaluation of insomnia.    Hospital Course: Seroquel Overdose: as per the patient , she had overdosed on seroquel, because of insomnia. She reports she has not slept for 11 months. Psychiatry consult was obtained and recommended trazadone 150 mg at bedtime with another dose if she does not sleep in one hour and not to exceed 2 tablets in 24 hours period. She does not report any suicidal ideation. And also recommended IOP  Hypotension: resolved with fluids. Initally she was started on pressors which were discontinued with improvement of blood pressure. Her heart failure medications were held secondary to hypotensiON.  Altered Mental status: most likely secondary to metabolic encephalopathy. Which has resolved with improvement in blood pressure.   Chronic Systolic Heart failure: stable. Heart failure medications were held initially secondary to hypotension,. As hypotension resolved. The medications were slowly restarted and she was discharged on the same doses of heart failure medications.   Chronic renal insufficiency: at baseline. Recommended to stop naproxen.    Medication List  As of 08/16/2011 11:35 AM   STOP taking these medications         naproxen 500 MG tablet      QUEtiapine 100 MG tablet         TAKE these  medications         aspirin EC 81 MG tablet   Take 81 mg by mouth daily.      carvedilol 12.5 MG tablet   Commonly known as: COREG   Take 12.5 mg by mouth 2 (two) times daily.      esomeprazole 40 MG capsule   Commonly known as: NEXIUM   Take 40 mg by mouth daily before breakfast.      metolazone 2.5 MG tablet   Commonly known as: ZAROXOLYN   Take 1 tablet (2.5 mg total) by mouth as needed.      torsemide 20 MG tablet   Commonly known as: DEMADEX   80 mg (4 tabs) po in the morning and 40 mg (2 tabs) po in the evening      traZODone 150 MG tablet   Commonly known as: DESYREL   Take 1 tablet (150 mg total) by mouth at bedtime.             Day of Discharge BP 105/71  Pulse 97  Temp(Src) 97.6 F (36.4 C) (Oral)  Resp 20  Ht 5\' 2"  (1.575 m)  Wt 155.7 kg (343 lb 4.1 oz)  BMI 62.78 kg/m2  SpO2 95%  Physical Exam: On exam she is alert afebrile and comfortable  CVS S1S2 [place]ard  Lungs clear  Abdomen soft non tender non distended bowel sounds are heard  Extremities:no edema   No results found for this or any previous visit (from the past 24 hour(s)).  Disposition: Home   Follow-up Appts: Discharge Orders    Future Appointments: Provider: Department: Dept Phone: Center:   10/11/2011 2:45 PM Eliot Ford  Shirlee Latch, MD [facility] [phone] [facility]   10/11/2011 3:00 PM [facility] [phone] [facility]     Future Orders Please Complete By Expires   Diet - low sodium heart healthy      Discharge instructions      Comments:   Follow up with heart clinic as recommended and outpatient psychiatry as recommended.   Activity as tolerated - No restrictions           Tests Needing Follow-up: Outpatient Psychiatry follow up.   Time spent in discharge (includes decision making & examination of pt): 50 minutes  Signed: Renato Spellman 08/16/2011, 11:35 AM  [id number]`

## 2011-08-19 NOTE — Progress Notes (Signed)
Patient ID: [name], female   DOB: 1960/10/09, 51 y.o.   MRN: [id number]  HPI:  [name] is 51 yo woman with a h/o morbid obesity, schizophrenia/schizoaffective disorder, RA, PUD and systolic CHF (EF 40-98%) probably secondary to severe MR now s/p MV repair. Last echo showed EF 30-35%.  TEE (6/10): EF 40%, diffuse hypokinesis, mild to moderately dilated LV, severe eccentric MR, small PFO.  Cardiac MRI (6/10): EF 37% with global hypokinesis, moderate to severe MR, no delayed enhancement (no evidence for sarcoidosis or other infiltrative disease.  TTE (10/10) after MV repair: EF 25-30%, moderately dilated LV, moderate diastolic dysfunction, mildly depressed RV function, trivial MR, MV mean gradient 5 mmHg, PASP 30 mmHg.  RHC (12/10): mean RA 19, PA 46/26, mean PCWP 28, CI 2.5, SVO2 63%.  TTE (2/11): EF 35-40% with diffuse hypokinesis, no significant MR or MS.  TTE (7/11): EF 45%, mild global hypokinesis, s/p MV repair, no mitral regurgitation, minimal mitral stenosis, PA systolic pressure 40 mmHg, mild LV dilation.  TTE (11/12): EF 30-35%, mild LV dilation, mild LVH, s/p MV repair with no regurgitation and minimal stenosis  Discharged 08/06/11 from [facility] due to overdose on Seroquel.   She is here for follow up. Denies SOB/PND/Orthopnea. +edema.  Not weighing daily. She has been taking BIDIL, Hydralazine, and IMDUR by mistake. Continues to drink excess fluid. She has CPAP but it remains in storage.   ROS: All systems negative except as listed in HPI, PMH and Problem List.  Past Medical History  Diagnosis Date  . Severe mitral regurgitation     Minimally invasive vale repair on 12/18/08. Additionally TV repair  and PFO closure.  . CHF (congestive heart failure)     TEE(6/10): EF 40%  . Microcytic anemia     EGD and colonoscopy in 12/11 did not reveal source of bleeding.Plan capsule endoscopy.  . Gastric ulcer   . Morbid obesity   . Obstructive sleep apnea     on CPAP  . GERD  (gastroesophageal reflux disease)   . HTN (hypertension)   . Gout   . Depression   . Colitis 2/11  . SOB (shortness of breath)   . Chronic back pain   . Chronic knee pain   . DVT (deep venous thrombosis)     right leg  . Anxiety   . Rheumatoid arthritis     "shoulders & knees"  . Sleep disturbance     07/14/11 "haven't slept in 10 months; since going to [facility]"  . Hx: UTI (urinary tract infection)   . Nocturia   . COPD (chronic obstructive pulmonary disease)   . Bronchitis   . PTSD (post-traumatic stress disorder)   . Acute-on-chronic respiratory failure   . Schizophrenia     Current Outpatient Prescriptions  Medication Sig Dispense Refill  . aspirin EC 81 MG tablet Take 81 mg by mouth daily.      . carvedilol (COREG) 12.5 MG tablet Take 12.5 mg by mouth 2 (two) times daily.      Marland Kitchen esomeprazole (NEXIUM) 40 MG capsule Take 40 mg by mouth daily before breakfast.        . hydrALAZINE (APRESOLINE) 25 MG tablet Take 3 tablets (75 mg total) by mouth 3 (three) times daily.  270 tablet  6  . isosorbide dinitrate (ISORDIL) 20 MG tablet Take 2 tablets (40 mg total) by mouth every 8 (eight) hours.  180 tablet  12  . losartan (COZAAR) 100 MG tablet Take 1 tablet (100 mg  total) by mouth daily.      . metolazone (ZAROXOLYN) 2.5 MG tablet Take 1 tablet (2.5 mg total) by mouth as needed.      . torsemide (DEMADEX) 20 MG tablet 80 mg (4 tabs) po in the morning and 40 mg (2 tabs) po in the evening  180 tablet  3  . traZODone (DESYREL) 150 MG tablet Take 1 tablet (150 mg total) by mouth at bedtime.  30 tablet  0  . DISCONTD: QUEtiapine (SEROQUEL) 100 MG tablet Take 400 mg by mouth at bedtime.          PHYSICAL EXAM: Filed Vitals:   08/15/11 1611  BP: 112/54  Pulse: 98  343 (354)  General: Obese. No acute distress. No resp difficulty HEENT: normal Neck: supple. JVP 8-9. Carotids 2+ bilaterally; no bruits. No lymphadenopathy or thryomegaly appreciated. Cor: PMI normal. Regular rate & rhythm.  S3 No rubs, or murmurs. Lungs: clear Abdomen: obese, soft, nontender, nondistended. No hepatosplenomegaly. No bruits or masses. Good bowel sounds. Extremities: no cyanosis, clubbing, rash, R and LLE 1+edema Neuro: alert & orientedx3, cranial nerves grossly intact. Moves all 4 extremities w/o difficulty. Affect pleasant.    ASSESSMENT & PLAN:

## 2011-09-19 ENCOUNTER — Ambulatory Visit (HOSPITAL_COMMUNITY): Payer: Medicare Other

## 2011-09-28 ENCOUNTER — Ambulatory Visit (HOSPITAL_COMMUNITY)
Admission: RE | Admit: 2011-09-28 | Discharge: 2011-09-28 | Disposition: A | Discharge status: Home / Self Care | Payer: Medicare Other | Source: Ambulatory Visit | Attending: Internal Medicine | Admitting: Internal Medicine

## 2011-09-28 ENCOUNTER — Encounter (HOSPITAL_COMMUNITY): Payer: Self-pay

## 2011-09-28 VITALS — BP 124/80 | HR 105 | Ht 66.0 in | Wt 346.0 lb

## 2011-09-28 DIAGNOSIS — I5022 Chronic systolic (congestive) heart failure: Secondary | ICD-10-CM | POA: Insufficient documentation

## 2011-09-28 NOTE — Assessment & Plan Note (Signed)
She returns for follow up and she is not doing well because she is not taking her medications as prescribed. She is only taking her medications 3 days per week because she says that she does not feel like taking them .  Volume status elevated to her jaw. I stressed the importance of medication compliance, low salt food choices,  limiting fluid intake, and daily weights. She continues to eat macaroni and excessive fluids. I have also instructed her to contact [facility] for CPAP machine. I am very concerned about her lack of compliance.  Follow up Monday to reassess.

## 2011-09-28 NOTE — Progress Notes (Signed)
Patient ID: [name], female   DOB: Nov 30, 1960, 51 y.o.   MRN: [id number]  HPI:  [name] is 51 yo woman with a h/o morbid obesity, schizophrenia/schizoaffective disorder, RA, PUD and systolic CHF (EF 40-98%) probably secondary to severe MR now s/p MV repair. Last echo showed EF 30-35%.  TEE (6/10): EF 40%, diffuse hypokinesis, mild to moderately dilated LV, severe eccentric MR, small PFO.  Cardiac MRI (6/10): EF 37% with global hypokinesis, moderate to severe MR, no delayed enhancement (no evidence for sarcoidosis or other infiltrative disease.  TTE (10/10) after MV repair: EF 25-30%, moderately dilated LV, moderate diastolic dysfunction, mildly depressed RV function, trivial MR, MV mean gradient 5 mmHg, PASP 30 mmHg.  RHC (12/10): mean RA 19, PA 46/26, mean PCWP 28, CI 2.5, SVO2 63%.  TTE (2/11): EF 35-40% with diffuse hypokinesis, no significant MR or MS.  TTE (7/11): EF 45%, mild global hypokinesis, s/p MV repair, no mitral regurgitation, minimal mitral stenosis, PA systolic pressure 40 mmHg, mild LV dilation.  TTE (11/12): EF 30-35%, mild LV dilation, mild LVH, s/p MV repair with no regurgitation and minimal stenosis  Discharged 08/06/11 from [facility] due to overdose on Seroquel.   She is here for follow up. Denies SOB/PND/Orthopnea.  She is weighing occasionally. Weight at home 346 pounds. CPAP not working and she has not called to get it fixed. She is not compliant with medications. She is only taking her medications 3 days a week. Eating macaroni and drinking excessive amounts of Koolaide >2 liters per day. Chronic lower extremity edema.     ROS: All systems negative except as listed in HPI, PMH and Problem List.  Past Medical History  Diagnosis Date  . Severe mitral regurgitation     Minimally invasive vale repair on 12/18/08. Additionally TV repair  and PFO closure.  . CHF (congestive heart failure)     TEE(6/10): EF 40%  . Microcytic anemia     EGD and colonoscopy in 12/11 did  not reveal source of bleeding.Plan capsule endoscopy.  . Gastric ulcer   . Morbid obesity   . Obstructive sleep apnea     on CPAP  . GERD (gastroesophageal reflux disease)   . HTN (hypertension)   . Gout   . Depression   . Colitis 2/11  . SOB (shortness of breath)   . Chronic back pain   . Chronic knee pain   . DVT (deep venous thrombosis)     right leg  . Anxiety   . Rheumatoid arthritis     "shoulders & knees"  . Sleep disturbance     07/14/11 "haven't slept in 10 months; since going to [facility]"  . Hx: UTI (urinary tract infection)   . Nocturia   . COPD (chronic obstructive pulmonary disease)   . Bronchitis   . PTSD (post-traumatic stress disorder)   . Acute-on-chronic respiratory failure   . Schizophrenia     Current Outpatient Prescriptions  Medication Sig Dispense Refill  . aspirin EC 81 MG tablet Take 81 mg by mouth daily.      . carvedilol (COREG) 12.5 MG tablet Take 12.5 mg by mouth 2 (two) times daily.      Marland Kitchen esomeprazole (NEXIUM) 40 MG capsule Take 40 mg by mouth daily before breakfast.        . hydrALAZINE (APRESOLINE) 25 MG tablet Take 3 tablets (75 mg total) by mouth 3 (three) times daily.  270 tablet  6  . isosorbide dinitrate (ISORDIL) 20 MG tablet Take  2 tablets (40 mg total) by mouth every 8 (eight) hours.  180 tablet  12  . losartan (COZAAR) 100 MG tablet Take 1 tablet (100 mg total) by mouth daily.      . metolazone (ZAROXOLYN) 2.5 MG tablet Take 1 tablet (2.5 mg total) by mouth as needed.      . torsemide (DEMADEX) 20 MG tablet 80 mg (4 tabs) po in the morning and 40 mg (2 tabs) po in the evening  180 tablet  3  . DISCONTD: QUEtiapine (SEROQUEL) 100 MG tablet Take 400 mg by mouth at bedtime.          PHYSICAL EXAM: Filed Vitals:   09/28/11 1122  BP: 124/80  Pulse: 105  346 (343)  General: Obese. No acute distress. No resp difficulty HEENT: normal Neck: supple. JVP jaw Carotids 2+ bilaterally; no bruits. No lymphadenopathy or thryomegaly  appreciated. Cor: PMI normal. Regular rate & rhythm. S3 No rubs, or murmurs. Lungs: clear Abdomen: obese, soft, nontender, nondistended. No hepatosplenomegaly. No bruits or masses. Good bowel sounds. Extremities: no cyanosis, clubbing, rash, R and LLE 2+edema Neuro: alert & orientedx3, cranial nerves grossly intact. Moves all 4 extremities w/o difficulty. Affect pleasant.    ASSESSMENT & PLAN:

## 2011-09-28 NOTE — Patient Instructions (Signed)
Follow up Monday  Please take all your medications  Do the following things EVERYDAY: 1) Weigh yourself in the morning before breakfast. Write it down and keep it in a log. 2) Take your medicines as prescribed 3) Eat low salt foods--Limit salt (sodium) to 2000 mg per day.  4) Stay as active as you can everyday 5) Limit all fluids for the day to less than 2 liters

## 2011-10-03 ENCOUNTER — Ambulatory Visit (HOSPITAL_BASED_OUTPATIENT_CLINIC_OR_DEPARTMENT_OTHER)
Admission: RE | Admit: 2011-10-03 | Discharge: 2011-10-03 | Disposition: A | Discharge status: Home / Self Care | Payer: Medicare Other | Source: Ambulatory Visit | Attending: Internal Medicine | Admitting: Internal Medicine

## 2011-10-03 ENCOUNTER — Inpatient Hospital Stay (HOSPITAL_COMMUNITY)
Admission: AD | Admit: 2011-10-03 | Discharge: 2011-10-06 | DRG: 291 | Disposition: A | Discharge status: Home Health | Payer: Medicare Other | Source: Ambulatory Visit | Attending: Internal Medicine | Admitting: Internal Medicine

## 2011-10-03 VITALS — BP 150/80 | HR 90 | Ht 66.0 in | Wt 349.1 lb

## 2011-10-03 DIAGNOSIS — Z91199 Patient's noncompliance with other medical treatment and regimen due to unspecified reason: Secondary | ICD-10-CM

## 2011-10-03 DIAGNOSIS — F259 Schizoaffective disorder, unspecified: Secondary | ICD-10-CM

## 2011-10-03 DIAGNOSIS — M109 Gout, unspecified: Secondary | ICD-10-CM

## 2011-10-03 DIAGNOSIS — N390 Urinary tract infection, site not specified: Secondary | ICD-10-CM | POA: Diagnosis present

## 2011-10-03 DIAGNOSIS — I5023 Acute on chronic systolic (congestive) heart failure: Secondary | ICD-10-CM

## 2011-10-03 DIAGNOSIS — J9620 Acute and chronic respiratory failure, unspecified whether with hypoxia or hypercapnia: Secondary | ICD-10-CM | POA: Diagnosis present

## 2011-10-03 DIAGNOSIS — F32A Depression, unspecified: Secondary | ICD-10-CM

## 2011-10-03 DIAGNOSIS — J449 Chronic obstructive pulmonary disease, unspecified: Secondary | ICD-10-CM | POA: Diagnosis present

## 2011-10-03 DIAGNOSIS — Z9119 Patient's noncompliance with other medical treatment and regimen: Secondary | ICD-10-CM

## 2011-10-03 DIAGNOSIS — G4733 Obstructive sleep apnea (adult) (pediatric): Secondary | ICD-10-CM | POA: Insufficient documentation

## 2011-10-03 DIAGNOSIS — N289 Disorder of kidney and ureter, unspecified: Secondary | ICD-10-CM

## 2011-10-03 DIAGNOSIS — Z87891 Personal history of nicotine dependence: Secondary | ICD-10-CM

## 2011-10-03 DIAGNOSIS — N179 Acute kidney failure, unspecified: Secondary | ICD-10-CM | POA: Diagnosis present

## 2011-10-03 DIAGNOSIS — F329 Major depressive disorder, single episode, unspecified: Secondary | ICD-10-CM

## 2011-10-03 DIAGNOSIS — M069 Rheumatoid arthritis, unspecified: Secondary | ICD-10-CM

## 2011-10-03 DIAGNOSIS — F209 Schizophrenia, unspecified: Secondary | ICD-10-CM | POA: Diagnosis present

## 2011-10-03 DIAGNOSIS — I129 Hypertensive chronic kidney disease with stage 1 through stage 4 chronic kidney disease, or unspecified chronic kidney disease: Secondary | ICD-10-CM | POA: Diagnosis present

## 2011-10-03 DIAGNOSIS — E876 Hypokalemia: Secondary | ICD-10-CM

## 2011-10-03 DIAGNOSIS — E669 Obesity, unspecified: Secondary | ICD-10-CM

## 2011-10-03 DIAGNOSIS — I509 Heart failure, unspecified: Secondary | ICD-10-CM | POA: Diagnosis present

## 2011-10-03 DIAGNOSIS — N189 Chronic kidney disease, unspecified: Secondary | ICD-10-CM | POA: Diagnosis present

## 2011-10-03 DIAGNOSIS — I10 Essential (primary) hypertension: Secondary | ICD-10-CM | POA: Insufficient documentation

## 2011-10-03 DIAGNOSIS — J4489 Other specified chronic obstructive pulmonary disease: Secondary | ICD-10-CM | POA: Diagnosis present

## 2011-10-03 DIAGNOSIS — I080 Rheumatic disorders of both mitral and aortic valves: Secondary | ICD-10-CM | POA: Insufficient documentation

## 2011-10-03 LAB — CBC
MCV: 77.1 fL — ABNORMAL LOW (ref 78.0–100.0)
Platelets: 353 10*3/uL (ref 150–400)
RBC: 4.32 MIL/uL (ref 3.87–5.11)
RDW: 17.7 % — ABNORMAL HIGH (ref 11.5–15.5)
WBC: 5.3 10*3/uL (ref 4.0–10.5)

## 2011-10-03 LAB — BASIC METABOLIC PANEL
BUN: 6 mg/dL (ref 6–23)
Chloride: 106 mEq/L (ref 96–112)
GFR calc Af Amer: >90 mL/min (ref >90)
GFR calc non Af Amer: >90 mL/min (ref >90)
Glucose, Bld: 91 mg/dL (ref 70–99)
Potassium: 3.3 mEq/L — ABNORMAL LOW (ref 3.5–5.1)
Sodium: 141 mEq/L (ref 135–145)

## 2011-10-03 LAB — PRO B NATRIURETIC PEPTIDE: Pro B Natriuretic peptide (BNP): 715.5 pg/mL — ABNORMAL HIGH (ref 0–125)

## 2011-10-03 MED ORDER — FUROSEMIDE 10 MG/ML IJ SOLN
80.0000 mg | Freq: Two times a day (BID) | INTRAMUSCULAR | Status: DC
  Administered 2011-10-03: 80 mg via INTRAVENOUS
  Filled 2011-10-03 (×4): qty 8

## 2011-10-03 MED ORDER — ASPIRIN EC 81 MG PO TBEC
81.0000 mg | DELAYED_RELEASE_TABLET | Freq: Every day | ORAL | Status: DC
  Administered 2011-10-03 – 2011-10-06 (×4): 81 mg via ORAL
  Filled 2011-10-03 (×4): qty 1

## 2011-10-03 MED ORDER — POTASSIUM CHLORIDE CRYS ER 20 MEQ PO TBCR
40.0000 meq | EXTENDED_RELEASE_TABLET | Freq: Once | ORAL | Status: AC
  Administered 2011-10-03: 40 meq via ORAL
  Filled 2011-10-03: qty 2

## 2011-10-03 MED ORDER — ISOSORBIDE DINITRATE 20 MG PO TABS
20.0000 mg | ORAL_TABLET | Freq: Three times a day (TID) | ORAL | Status: DC
  Administered 2011-10-03 – 2011-10-06 (×9): 20 mg via ORAL
  Filled 2011-10-03 (×12): qty 1

## 2011-10-03 MED ORDER — SODIUM CHLORIDE 0.9 % IJ SOLN: 3.0000 mL | INTRAMUSCULAR | Status: DC | PRN

## 2011-10-03 MED ORDER — LOSARTAN POTASSIUM 50 MG PO TABS
100.0000 mg | ORAL_TABLET | Freq: Every day | ORAL | Status: DC
  Administered 2011-10-03 – 2011-10-06 (×4): 100 mg via ORAL
  Filled 2011-10-03 (×4): qty 2

## 2011-10-03 MED ORDER — HYDRALAZINE HCL 50 MG PO TABS
75.0000 mg | ORAL_TABLET | Freq: Three times a day (TID) | ORAL | Status: DC
  Administered 2011-10-03 – 2011-10-06 (×9): 75 mg via ORAL
  Filled 2011-10-03 (×12): qty 1

## 2011-10-03 MED ORDER — ENOXAPARIN SODIUM 30 MG/0.3ML ~~LOC~~ SOLN
30.0000 mg | SUBCUTANEOUS | Status: DC
  Administered 2011-10-03: 30 mg via SUBCUTANEOUS
  Filled 2011-10-03 (×2): qty 0.3

## 2011-10-03 MED ORDER — SODIUM CHLORIDE 0.9 % IJ SOLN
3.0000 mL | Freq: Two times a day (BID) | INTRAMUSCULAR | Status: DC
  Administered 2011-10-03 – 2011-10-05 (×6): 3 mL via INTRAVENOUS

## 2011-10-03 MED ORDER — ZOLPIDEM TARTRATE 5 MG PO TABS
5.0000 mg | ORAL_TABLET | Freq: Every evening | ORAL | Status: DC | PRN
  Administered 2011-10-04 – 2011-10-05 (×3): 5 mg via ORAL
  Filled 2011-10-03 (×3): qty 1

## 2011-10-03 MED ORDER — ACETAMINOPHEN 325 MG PO TABS: 650.0000 mg | ORAL_TABLET | ORAL | Status: DC | PRN

## 2011-10-03 MED ORDER — SODIUM CHLORIDE 0.9 % IV SOLN: 250.0000 mL | INTRAVENOUS | Status: DC | PRN

## 2011-10-03 MED ORDER — ONDANSETRON HCL 4 MG/2ML IJ SOLN: 4.0000 mg | Freq: Four times a day (QID) | INTRAMUSCULAR | Status: DC | PRN

## 2011-10-03 NOTE — Progress Notes (Signed)
Patient ID: [name], female   DOB: 1960/06/15, 51 y.o.   MRN: [id number] Primary cardiologist: Dr. Shirlee Latch Psychiatrist: Dr Roxan Hockey  HPI: [name] is 51 yo woman with a h/o morbid obesity, schizophrenia/schizoaffective disorder, RA, PUD and systolic CHF (EF 11-91%) probably secondary to severe MR now s/p MV repair. Last echo showed EF 30-35%.  TEE (6/10): EF 40%, diffuse hypokinesis, mild to moderately dilated LV, severe eccentric MR, small PFO.  Cardiac MRI (6/10): EF 37% with global hypokinesis, moderate to severe MR, no delayed enhancement (no evidence for sarcoidosis or other infiltrative disease.  TTE (10/10) after MV repair: EF 25-30%, moderately dilated LV, moderate diastolic dysfunction, mildly depressed RV function, trivial MR, MV mean gradient 5 mmHg, PASP 30 mmHg.  RHC (12/10): mean RA 19, PA 46/26, mean PCWP 28, CI 2.5, SVO2 63%.  TTE (2/11): EF 35-40% with diffuse hypokinesis, no significant MR or MS.  TTE (7/11): EF 45%, mild global hypokinesis, s/p MV repair, no mitral regurgitation, minimal mitral stenosis, PA systolic pressure 40 mmHg, mild LV dilation.  TTE (11/12): EF 30-35%, mild LV dilation, mild LVH, s/p MV repair with no regurgitation and minimal stenosis ECHO 07/15/11 EF 25-30%  Discharged 07/21/11 [facility] weight 338 pounds Discharged 08/06/11 from [facility] due to overdose on Seroquel.   She returns for follow up today and remains volume overloaded. She was evaluated 09/28/11 in HF clinic and increased volume was noted because she had not been taking any medications. She takes her medications intermittently because she does not want to go bathroom frequently. She has progressive symptoms with SOB noted on exertion. +Orthopnea + PND. Complains of difficulty sleeping.   She is not weighing. She is not using CPAP because it is broken.    Cardiac Review of Systems: {Y] = yes [ ]  = no  Chest Pain [ ]  Resting SOB [ Y ] Exertional SOB [Y ] Orthopnea [ ]   Pedal Edema [ Y ]  Palpitations [ ]  Syncope [ ]  Presyncope [ ]   General Review of Systems: [Y] = yes [ ] =no  Constitional: recent weight change [ Y ]; anorexia [ ] ; fatigue [ Y]; nausea [ ] ; night sweats [ ] ; fever [ ] ; or chills [ ] ;  Dental: poor dentition[ ] ; Last Dentist visit:  Eye : blurred vision [ ] ; diplopia [ ] ; vision changes [ ] ; Amaurosis fugax[ ] ;  Resp: cough [ ] ; wheezing[ ] ; hemoptysis[ ] ; shortness of breath[ Y ]; paroxysmal nocturnal dyspnea[ Y ]; dyspnea on exertion[ Y ]; or orthopnea[ Y ];  GI: gallstones[ ] , vomiting[ ] ; dysphagia[ ] ; melena[ ] ; hematochezia [ ] ; heartburn[ ] ; Hx of Colonoscopy[ ] ;  GU: kidney stones [ ] ; hematuria[ ] ; dysuria [ ] ; nocturia[ ] ; history of obstruction [ ] ;  Skin: rash, swelling[ ] ;, hair loss[ ] ; peripheral edema[ Y ]; or itching[ ] ;  Musculosketetal: myalgias[ ] ; joint swelling[Y ]; joint erythema[ ] ;  joint pain[ ] ; back pain[ ] ;  Heme/Lymph: bruising[ ] ; bleeding[ ] ; anemia[ ] ;  Neuro: TIA[ ] ; headaches[ ] ; stroke[ ] ; vertigo[ ] ; seizures[ ] ; paresthesias[ ] ; difficulty walking[ ] ;  Psych:depression[ Y ]; anxiety[ ] ;  Endocrine: diabetes[ ] ; thyroid dysfunction[Y ];  Immunizations: Flu [ ] ; Pneumococcal[ ] ;  Other:   Past Medical History  Diagnosis Date  . Severe mitral regurgitation     Minimally invasive vale repair on 12/18/08. Additionally TV repair  and PFO closure.  . CHF (congestive heart failure)     TEE(6/10): EF 40%  . Microcytic anemia  EGD and colonoscopy in 12/11 did not reveal source of bleeding.Plan capsule endoscopy.  . Gastric ulcer   . Morbid obesity   . Obstructive sleep apnea     on CPAP  . GERD (gastroesophageal reflux disease)   . HTN (hypertension)   . Gout   . Depression   . Colitis 2/11  . SOB (shortness of breath)   . Chronic back pain   . Chronic knee pain   . DVT (deep venous thrombosis)     right leg  . Anxiety   . Rheumatoid arthritis     "shoulders & knees"  . Sleep disturbance     07/14/11 "haven't  slept in 10 months; since going to [facility]"  . Hx: UTI (urinary tract infection)   . Nocturia   . COPD (chronic obstructive pulmonary disease)   . Bronchitis   . PTSD (post-traumatic stress disorder)   . Acute-on-chronic respiratory failure   . Schizophrenia    No family history on file.   History   Social History  . Marital Status: Single    Spouse Name: N/A    Number of Children: N/A  . Years of Education: N/A   Occupational History  . Not on file.   Social History Main Topics  . Smoking status: Former Smoker -- 0.5 packs/day for 27 years    Types: Cigarettes    Quit date: 12/18/2008  . Smokeless tobacco: Never Used  . Alcohol Use: Yes     "stopped drinking alcohol 12/2003; did drink ~ 6 shots/wk"  . Drug Use: Yes    Special: "Crack" cocaine     "lasst cocaine 2009"  . Sexually Active: Not Currently   Other Topics Concern  . Not on file   Social History Narrative  . No narrative on file    Current Outpatient Prescriptions  Medication Sig Dispense Refill  . aspirin EC 81 MG tablet Take 81 mg by mouth daily.      . carvedilol (COREG) 12.5 MG tablet Take 12.5 mg by mouth 2 (two) times daily.      Marland Kitchen esomeprazole (NEXIUM) 40 MG capsule Take 40 mg by mouth daily before breakfast.        . hydrALAZINE (APRESOLINE) 25 MG tablet Take 3 tablets (75 mg total) by mouth 3 (three) times daily.  270 tablet  6  . isosorbide dinitrate (ISORDIL) 20 MG tablet Take 20 mg by mouth every 8 (eight) hours.      Marland Kitchen losartan (COZAAR) 100 MG tablet Take 1 tablet (100 mg total) by mouth daily.      Marland Kitchen torsemide (DEMADEX) 20 MG tablet 80 mg (4 tabs) po in the morning and 40 mg (2 tabs) po in the evening  180 tablet  3  . DISCONTD: QUEtiapine (SEROQUEL) 100 MG tablet Take 400 mg by mouth at bedtime.          PHYSICAL EXAM: Filed Vitals:   10/03/11 1055  BP: 150/80  Pulse: 90  349 (346)  General: Obese. No acute distress. No resp difficulty HEENT: normal Neck: supple. JVP jaw Carotids 2+  bilaterally; no bruits. No lymphadenopathy or thryomegaly appreciated. Cor: PMI normal. Regular rate & rhythm. S3 No rubs, or murmurs. Lungs: clear Abdomen: obese, soft, nontender, nondistended. No hepatosplenomegaly. No bruits or masses. Good bowel sounds. Extremities: no cyanosis, clubbing, rash, R and LLE 3+edema Neuro: alert & orientedx3, cranial nerves grossly intact. Moves all 4 extremities w/o difficulty. Affect pleasant.   ASSESSMENT  1. Acute/Chronic Systolic Heart  Failure (EF 25-30%) 2. Acute/Chronic respiratory failure 3. OSA 4. Severe MR, s/p MV repair 2010  5.  Morbid Obesity  6. Acute/Chronic renal failure  7. Gout  8. Schizophrenia  9. Depression    Plan  Ms. Barsch has significant volume overload and NYHA Class IV symptoms in the setting of medication noncompliance. We will admit to the hospital for IV diuresis - start IV Lasix 80 mg BID. Consult SW and Case Manager to assist with disease management and compliance issues.  Paxtyn Boyar,MD 12:30 PM

## 2011-10-03 NOTE — Assessment & Plan Note (Signed)
She returns for follow up today and remains volume overloaded. Dyspnea noted at rest and with exertion. Weight up at lest 11 pounds. Will admit to diurese. The major issue with her current condition is medication noncompliance. Will consult SW and case manager to assist with non compliance.

## 2011-10-03 NOTE — Progress Notes (Signed)
Encounter addended by: Dolores Patty, MD on: 10/03/2011 12:32 PM<BR>     Documentation filed: Follow-up Section, LOS Section

## 2011-10-03 NOTE — H&P (Signed)
Patient ID: [name], female DOB: [birth year], 51 y.o. MRN: [id number]  Primary cardiologist: Dr. Shirlee Latch  Psychiatrist: Dr Roxan Hockey   HPI:  [name] is 51 yo woman with a h/o morbid obesity, schizophrenia/schizoaffective disorder, RA, PUD and systolic CHF (EF 03-50%) probably secondary to severe MR now s/p MV repair. Last echo showed EF 30-35%.  TEE (6/10): EF 40%, diffuse hypokinesis, mild to moderately dilated LV, severe eccentric MR, small PFO.  Cardiac MRI (6/10): EF 37% with global hypokinesis, moderate to severe MR, no delayed enhancement (no evidence for sarcoidosis or other infiltrative disease.  TTE (10/10) after MV repair: EF 25-30%, moderately dilated LV, moderate diastolic dysfunction, mildly depressed RV function, trivial MR, MV mean gradient 5 mmHg, PASP 30 mmHg.  RHC (12/10): mean RA 19, PA 46/26, mean PCWP 28, CI 2.5, SVO2 63%.  TTE (2/11): EF 35-40% with diffuse hypokinesis, no significant MR or MS.  TTE (7/11): EF 45%, mild global hypokinesis, s/p MV repair, no mitral regurgitation, minimal mitral stenosis, PA systolic pressure 40 mmHg, mild LV dilation.  TTE (11/12): EF 30-35%, mild LV dilation, mild LVH, s/p MV repair with no regurgitation and minimal stenosis  ECHO 07/15/11 EF 25-30%  Discharged 07/21/11 [facility] weight 338 pounds  Discharged 08/06/11 from [facility] due to overdose on Seroquel.   She returns for follow up today and remains volume overloaded. She was evaluated 09/28/11 in HF clinic and increased volume was noted because she had not been taking any medications. She takes her medications intermittently because she does not want to go bathroom frequently. She has only taken diuretics 1x over the past few days. She has progressive symptoms with SOB noted on exertion.  +Orthopnea + PND. Complains of difficulty sleeping. She is not weighing. She is not using CPAP because it is broken.   Cardiac Review of Systems: {Y] = yes [ ]  = no  Chest Pain [ ]  Resting SOB [ Y ] Exertional  SOB [Y ] Orthopnea [ ]   Pedal Edema [ Y ] Palpitations [ ]  Syncope [ ]  Presyncope [ ]   General Review of Systems: [Y] = yes [ ] =no  Constitional: recent weight change [ Y ]; anorexia [ ] ; fatigue [ Y]; nausea [ ] ; night sweats [ ] ; fever [ ] ; or chills [ ] ;  Dental: poor dentition[ ] ; Last Dentist visit:  Eye : blurred vision [ ] ; diplopia [ ] ; vision changes [ ] ; Amaurosis fugax[ ] ;  Resp: cough [ ] ; wheezing[ ] ; hemoptysis[ ] ; shortness of breath[ Y ]; paroxysmal nocturnal dyspnea[ Y ]; dyspnea on exertion[ Y ]; or orthopnea[ Y ];  GI: gallstones[ ] , vomiting[ ] ; dysphagia[ ] ; melena[ ] ; hematochezia [ ] ; heartburn[ ] ; Hx of Colonoscopy[ ] ;  GU: kidney stones [ ] ; hematuria[ ] ; dysuria [ ] ; nocturia[ ] ; history of obstruction [ ] ;  Skin: rash, swelling[ ] ;, hair loss[ ] ; peripheral edema[ Y ]; or itching[ ] ;  Musculosketetal: myalgias[ ] ; joint swelling[Y ]; joint erythema[ ] ;  joint pain[ ] ; back pain[ ] ;  Heme/Lymph: bruising[ ] ; bleeding[ ] ; anemia[ ] ;  Neuro: TIA[ ] ; headaches[ ] ; stroke[ ] ; vertigo[ ] ; seizures[ ] ; paresthesias[ ] ; difficulty walking[ ] ;  Psych:depression[ Y ]; anxiety[ ] ;  Endocrine: diabetes[ ] ; thyroid dysfunction[Y ];  Immunizations: Flu [ ] ; Pneumococcal[ ] ;  Other:   Past Medical History   Diagnosis  Date   .  Severe mitral regurgitation      Minimally invasive vale repair on 12/18/08. Additionally TV repair and PFO closure.   .  CHF (  congestive heart failure)      TEE(6/10): EF 40%   .  Microcytic anemia      EGD and colonoscopy in 12/11 did not reveal source of bleeding.Plan capsule endoscopy.   .  Gastric ulcer    .  Morbid obesity    .  Obstructive sleep apnea      on CPAP   .  GERD (gastroesophageal reflux disease)    .  HTN (hypertension)    .  Gout    .  Depression    .  Colitis  2/11   .  SOB (shortness of breath)    .  Chronic back pain    .  Chronic knee pain    .  DVT (deep venous thrombosis)      right leg   .  Anxiety    .  Rheumatoid  arthritis      "shoulders & knees"   .  Sleep disturbance      07/14/11 "haven't slept in 10 months; since going to [facility]"   .  Hx: UTI (urinary tract infection)    .  Nocturia    .  COPD (chronic obstructive pulmonary disease)    .  Bronchitis    .  PTSD (post-traumatic stress disorder)    .  Acute-on-chronic respiratory failure    .  Schizophrenia     Family Hx: No family h/o premature CAD.  History    Social History   .  Marital Status:  Single     Spouse Name:  N/A     Number of Children:  N/A   .  Years of Education:  N/A    Occupational History   .  Not on file.    Social History Main Topics   .  Smoking status:  Former Smoker -- 0.5 packs/day for 27 years     Types:  Cigarettes     Quit date:  12/18/2008   .  Smokeless tobacco:  Never Used   .  Alcohol Use:  Yes      "stopped drinking alcohol 12/2003; did drink ~ 6 shots/wk"   .  Drug Use:  Yes     Special:  "Crack" cocaine      "lasst cocaine 2009"   .  Sexually Active:  Not Currently    Other Topics  Concern   .  Not on file    Current Outpatient Prescriptions   Medication  Sig  Dispense  Refill   .  aspirin EC 81 MG tablet  Take 81 mg by mouth daily.     .  carvedilol (COREG) 12.5 MG tablet  Take 12.5 mg by mouth 2 (two) times daily.     Marland Kitchen  esomeprazole (NEXIUM) 40 MG capsule  Take 40 mg by mouth daily before breakfast.     .  hydrALAZINE (APRESOLINE) 25 MG tablet  Take 3 tablets (75 mg total) by mouth 3 (three) times daily.  270 tablet  6   .  isosorbide dinitrate (ISORDIL) 20 MG tablet  Take 20 mg by mouth every 8 (eight) hours.     Marland Kitchen  losartan (COZAAR) 100 MG tablet  Take 1 tablet (100 mg total) by mouth daily.     Marland Kitchen  torsemide (DEMADEX) 20 MG tablet  80 mg (4 tabs) po in the morning and 40 mg (2 tabs) po in the evening  180 tablet  3   .  DISCONTD: QUEtiapine (SEROQUEL) 100 MG tablet  Take 400 mg by mouth at bedtime.      PHYSICAL EXAM:  Filed Vitals:    10/03/11 1055   BP:  150/80   Pulse:  90   349  (346)  General: Obese. No acute distress. No resp difficulty  HEENT: normal  Neck: supple. JVP jaw Carotids 2+ bilaterally; no bruits. No lymphadenopathy or thryomegaly appreciated.  Cor: PMI normal. Regular rate & rhythm. S3 No rubs, or murmurs.  Lungs: clear  Abdomen: obese, soft, nontender, nondistended. No hepatosplenomegaly. No bruits or masses. Good bowel sounds.  Extremities: no cyanosis, clubbing, rash, R and LLE 3+edema  Neuro: alert & orientedx3, cranial nerves grossly intact. Moves all 4 extremities w/o difficulty. Affect pleasant.   ASSESSMENT  1. Acute/Chronic Systolic Heart Failure (EF 25-30%)  2. Acute/Chronic respiratory failure  3. OSA  4. Severe MR, s/p MV repair 2010  5. Morbid Obesity  6. Acute/Chronic renal failure  7. Gout  8. Schizophrenia  9. Depression   Plan  Ms. Ibsen has significant volume overload and NYHA Class IV symptoms in the setting of medication noncompliance. We will admit to the hospital for IV diuresis - start IV Lasix 80 mg BID. Consult SW and Case Manager to assist with disease management and compliance issues.   Wiley Flicker,MD  12:30 PM

## 2011-10-04 DIAGNOSIS — E669 Obesity, unspecified: Secondary | ICD-10-CM

## 2011-10-04 LAB — BASIC METABOLIC PANEL
BUN: 8 mg/dL (ref 6–23)
Chloride: 104 mEq/L (ref 96–112)
GFR calc Af Amer: >90 mL/min (ref >90)
GFR calc non Af Amer: 85 mL/min — ABNORMAL LOW (ref >90)
Potassium: 3.4 mEq/L — ABNORMAL LOW (ref 3.5–5.1)
Sodium: 140 mEq/L (ref 135–145)

## 2011-10-04 LAB — TSH: TSH: 0.613 u[IU]/mL (ref 0.350–4.500)

## 2011-10-04 MED ORDER — FUROSEMIDE 10 MG/ML IJ SOLN
80.0000 mg | Freq: Three times a day (TID) | INTRAMUSCULAR | Status: DC
  Administered 2011-10-04 – 2011-10-05 (×4): 80 mg via INTRAVENOUS
  Filled 2011-10-04 (×7): qty 8

## 2011-10-04 MED ORDER — POTASSIUM CHLORIDE CRYS ER 20 MEQ PO TBCR
40.0000 meq | EXTENDED_RELEASE_TABLET | Freq: Two times a day (BID) | ORAL | Status: DC
  Administered 2011-10-04 – 2011-10-06 (×5): 40 meq via ORAL
  Filled 2011-10-04 (×7): qty 2

## 2011-10-04 MED ORDER — ENOXAPARIN SODIUM 80 MG/0.8ML ~~LOC~~ SOLN
80.0000 mg | SUBCUTANEOUS | Status: DC
  Administered 2011-10-04 – 2011-10-05 (×2): 80 mg via SUBCUTANEOUS
  Filled 2011-10-04 (×3): qty 0.8

## 2011-10-04 NOTE — Progress Notes (Signed)
Advanced Heart Failure Rounding Note   Subjective:    [name] is 51 yo woman with a h/o morbid obesity, schizophrenia/schizoaffective disorder, RA, PUD and systolic CHF (EF 16-10%) probably secondary to severe MR now s/p MV repair. Last echo showed EF 30-35%.   Admitted from HF clinic 5/20 due to volume overload related to medication noncompliance. Weight up about 13 pounds. Diuresing well.  Weight down 5 pounds after 80 mg IV lasix.   Denies SOB/PND/Orthopnea. Ambulating room.   Objective:   Weight Range:  Vital Signs:   Temp:  [98.2 F (36.8 C)-99.8 F (37.7 C)] 98.8 F (37.1 C) (05/21 0454) Pulse Rate:  [84-93] 84  (05/21 0642) Resp:  [20-22] 22  (05/21 0454) BP: (90-156)/(41-96) 125/69 mmHg (05/21 0642) SpO2:  [68 %-100 %] 100 % (05/21 0454) FiO2 (%):  [21 %] 21 % (05/20 1536) Weight:  [158.36 kg (349 lb 1.9 oz)-163.4 kg (360 lb 3.7 oz)] 161.3 kg (355 lb 9.6 oz) (05/21 0454) Last BM Date: 10/02/11  Weight change: Filed Weights   10/03/11 1326 10/03/11 1536 10/04/11 0454  Weight: 163.4 kg (360 lb 3.7 oz) 163.4 kg (360 lb 3.7 oz) 161.3 kg (355 lb 9.6 oz)    Intake/Output:   Intake/Output Summary (Last 24 hours) at 10/04/11 0808 Last data filed at 10/04/11 9604  Gross per 24 hour  Intake    480 ml  Output    700 ml  Net   -220 ml     Physical Exam: General:  Well appearing. Dyspneic on ambulating. HEENT: normal Neck: supple. JVP to ear . Carotids 2+ bilat; no bruits. No lymphadenopathy or thryomegaly appreciated. Cor: PMI nondisplaced. Regular rate & rhythm. S3 No rubs  or murmurs. Lungs: clear Abdomen: soft, obese, nontender, distended. No hepatosplenomegaly. No bruits or masses. Good bowel sounds. Extremities: no cyanosis, clubbing, rash, R and LLE 2+ edema Neuro: alert & orientedx3, cranial nerves grossly intact. moves all 4 extremities w/o difficulty. Affect pleasant  Telemetry: Sinus rhythm  Labs: Basic Metabolic Panel:  Lab 10/04/11 5409 10/03/11  1540  NA 140 141  K 3.4* 3.3*  CL 104 106  CO2 26 27  GLUCOSE 111* 91  BUN 8 6  CREATININE 0.80 0.77  CALCIUM 8.9 9.1  MG -- 1.9  PHOS -- --    Liver Function Tests: No results found for this basename: AST:5,ALT:5,ALKPHOS:5,BILITOT:5,PROT:5,ALBUMIN:5 in the last 168 hours No results found for this basename: LIPASE:5,AMYLASE:5 in the last 168 hours No results found for this basename: AMMONIA:3 in the last 168 hours  CBC:  Lab 10/03/11 1540  WBC 5.3  NEUTROABS --  HGB 10.3*  HCT 33.3*  MCV 77.1*  PLT 353    Cardiac Enzymes: No results found for this basename: CKTOTAL:5,CKMB:5,CKMBINDEX:5,TROPONINI:5 in the last 168 hours  BNP: BNP (last 3 results)  Basename 10/03/11 1607 07/21/11 0525 07/14/11 1716  PROBNP 715.5* 56.2 1324.0*     Other results:    Imaging: No results found.   Medications:     Scheduled Medications:    . aspirin EC  81 mg Oral Daily  . enoxaparin (LOVENOX) injection  80 mg Subcutaneous Q24H  . furosemide  80 mg Intravenous Q8H  . isosorbide dinitrate  20 mg Oral TID   And  . hydrALAZINE  75 mg Oral Q8H  . losartan  100 mg Oral Daily  . potassium chloride  40 mEq Oral Once  . potassium chloride  40 mEq Oral BID  . sodium chloride  3 mL Intravenous  Q12H  . DISCONTD: enoxaparin  30 mg Subcutaneous Q24H  . DISCONTD: furosemide  80 mg Intravenous BID    Infusions:    PRN Medications: sodium chloride, acetaminophen, ondansetron (ZOFRAN) IV, sodium chloride, zolpidem   Assessment:  1. Acute/Chronic Systolic Heart Failure (EF 25-30%)  2. Acute/Chronic respiratory failure  3. OSA  4. Severe MR, s/p MV repair 2010  5. Morbid Obesity  6. Acute/Chronic renal failure  7. Gout  8. Schizophrenia  9. Depression 10. Hypokalemia   Plan/Discussion:    Admitted yesterday from HF clinic due to volume overload and dyspnea at rest. Continue to aggressive diuresis with IV lasix 80 mg tid. Replace potassium.  Continue to follow renal  function. Will not add Spironolactone due to ongoing  noncompliance though would like to get on board at some point if possible. Continue to hold beta blocker due to acute decompensation.   SW and Case Manager referral pending for assistance with compliance and HH referral. This will be an ongoing problem if remains noncompliant.    Length of Stay: 1  [name] 10/04/2011, 8:08 AM

## 2011-10-05 LAB — BASIC METABOLIC PANEL
BUN: 10 mg/dL (ref 6–23)
CO2: 27 mEq/L (ref 19–32)
Calcium: 9.1 mg/dL (ref 8.4–10.5)
GFR calc non Af Amer: 79 mL/min — ABNORMAL LOW (ref >90)
Glucose, Bld: 117 mg/dL — ABNORMAL HIGH (ref 70–99)
Potassium: 3.3 mEq/L — ABNORMAL LOW (ref 3.5–5.1)

## 2011-10-05 MED ORDER — TORSEMIDE 100 MG PO TABS
100.0000 mg | ORAL_TABLET | Freq: Every day | ORAL | Status: DC
  Administered 2011-10-05 – 2011-10-06 (×2): 100 mg via ORAL
  Filled 2011-10-05 (×2): qty 1

## 2011-10-05 MED ORDER — CARVEDILOL 6.25 MG PO TABS
6.2500 mg | ORAL_TABLET | Freq: Two times a day (BID) | ORAL | Status: DC
  Administered 2011-10-05 – 2011-10-06 (×3): 6.25 mg via ORAL
  Filled 2011-10-05 (×5): qty 1

## 2011-10-05 MED ORDER — POTASSIUM CHLORIDE CRYS ER 20 MEQ PO TBCR
40.0000 meq | EXTENDED_RELEASE_TABLET | Freq: Once | ORAL | Status: AC
  Administered 2011-10-05: 40 meq via ORAL
  Filled 2011-10-05: qty 2

## 2011-10-05 NOTE — Progress Notes (Signed)
Advanced Heart Failure Rounding Note   Subjective:    [name] is 51 yo woman with a h/o morbid obesity, schizophrenia/schizoaffective disorder, RA, PUD and systolic CHF (EF 16-10%) probably secondary to severe MR now s/p MV repair. Last echo showed EF 30-35%.   Admitted from HF clinic 5/20 due to volume overload related to medication noncompliance. Weight up about 13 pounds. Diuresing well.  Weight down 12 pounds after 80 mg IV lasix Q 8 hours.   Denies SOB/PND/Orthopnea. Wants to go home    Objective:   Weight Range:  Vital Signs:   Temp:  [98.3 F (36.8 C)-99.3 F (37.4 C)] 98.3 F (36.8 C) (05/22 0551) Pulse Rate:  [95-100] 97  (05/22 0551) Resp:  [20-21] 20  (05/22 0551) BP: (117-143)/(79-82) 117/80 mmHg (05/22 0551) SpO2:  [97 %-98 %] 98 % (05/22 0551) Weight:  [348 lb 12.3 oz (158.2 kg)] 348 lb 12.3 oz (158.2 kg) (05/22 0551) Last BM Date: 10/04/11  Weight change: Filed Weights   10/03/11 1536 10/04/11 0454 10/05/11 0551  Weight: 360 lb 3.7 oz (163.4 kg) 355 lb 9.6 oz (161.3 kg) 348 lb 12.3 oz (158.2 kg)    Intake/Output:   Intake/Output Summary (Last 24 hours) at 10/05/11 0745 Last data filed at 10/05/11 9604  Gross per 24 hour  Intake   1291 ml  Output   2125 ml  Net   -834 ml     Physical Exam: General:  Well appearing No respiratory distress HEENT: normal Neck: supple. JVP 5-6 . Carotids 2+ bilat; no bruits. No lymphadenopathy or thryomegaly appreciated. Cor: PMI nondisplaced. Regular rate & rhythm. S3 No rubs  or murmurs. Lungs: clear Abdomen: soft, obese, nontender, distended. No hepatosplenomegaly. No bruits or masses. Good bowel sounds. Extremities: no cyanosis, clubbing, rash, R and LLE trace edema Neuro: alert & orientedx3, cranial nerves grossly intact. moves all 4 extremities w/o difficulty. Affect pleasant  Telemetry: Sinus rhythm  Labs: Basic Metabolic Panel:  Lab 10/05/11 5409 10/04/11 0610 10/03/11 1540  NA 138 140 141  K 3.3* 3.4*  3.3*  CL 102 104 106  CO2 27 26 27   GLUCOSE 117* 111* 91  BUN 10 8 6   CREATININE 0.85 0.80 0.77  CALCIUM 9.1 8.9 9.1  MG -- -- 1.9  PHOS -- -- --    Liver Function Tests: No results found for this basename: AST:5,ALT:5,ALKPHOS:5,BILITOT:5,PROT:5,ALBUMIN:5 in the last 168 hours No results found for this basename: LIPASE:5,AMYLASE:5 in the last 168 hours No results found for this basename: AMMONIA:3 in the last 168 hours  CBC:  Lab 10/03/11 1540  WBC 5.3  NEUTROABS --  HGB 10.3*  HCT 33.3*  MCV 77.1*  PLT 353    Cardiac Enzymes: No results found for this basename: CKTOTAL:5,CKMB:5,CKMBINDEX:5,TROPONINI:5 in the last 168 hours  BNP: BNP (last 3 results)  Basename 10/03/11 1607 07/21/11 0525 07/14/11 1716  PROBNP 715.5* 56.2 1324.0*     Other results:    Imaging: No results found.   Medications:     Scheduled Medications:    . aspirin EC  81 mg Oral Daily  . carvedilol  6.25 mg Oral BID WC  . enoxaparin (LOVENOX) injection  80 mg Subcutaneous Q24H  . isosorbide dinitrate  20 mg Oral TID   And  . hydrALAZINE  75 mg Oral Q8H  . losartan  100 mg Oral Daily  . potassium chloride  40 mEq Oral BID  . potassium chloride  40 mEq Oral Once  . sodium chloride  3  mL Intravenous Q12H  . torsemide  100 mg Oral Daily  . DISCONTD: furosemide  80 mg Intravenous Q8H    Infusions:    PRN Medications: sodium chloride, acetaminophen, ondansetron (ZOFRAN) IV, sodium chloride, zolpidem   Assessment:  1. Acute/Chronic Systolic Heart Failure (EF 25-30%)  2. Acute/Chronic respiratory failure  3. OSA  4. Severe MR, s/p MV repair 2010  5. Morbid Obesity  6. Acute/Chronic renal failure  7. Gout  8. Schizophrenia  9. Depression 10. Hypokalemia   Plan/Discussion:   Volume status continues to improve. Down 12 pounds. Will stop IV Lasix and switch to Torsemide. I am concerned about compliance therefore will start Torsemide 100 mg daily. Start Carvedilol 6. 25 mg BID  and titrate back up as she tolerates. Will not start Spironolactone due to compliance. Replace Potassium.   Case Management/ SW consult pending. She will need HHRN for noncompliance and heart failure education.   Anticipate D/C tomorrow if volume status stable    Length of Stay: 2  [name] 10/05/2011, 7:45 AM

## 2011-10-05 NOTE — Progress Notes (Signed)
Clinical Social Work Department ADVANCED HEART FAILURE BRIEF PSYCHOSOCIAL ASSESSMENT 10/05/2011  Patient:  [name], [name]   Account Number:  [id number]  Admit Date:  10/03/2011  Clinical Social Worker:  Juliette Mangle   Date/Time:  10/05/2011 03:30 PM  Referred by:  Tonye Becket, NP  Referral date:  10/05/2011 Referred for  Other - See comment   Other referral type:    medication non-compliance   Interview type:  Patient Interview Other interview type:    PSYCHOSOCIAL DATA Living Status:  FAMILY Admitted from facility?  N Level of care:    Primary Support Name Primary Support Relationship  Gruetzmacher,T'Shima DAUGHTER  Oliva,Ronette DAUGHTER   Degree of support available:   Fair    Current Concerns  Adjustment to Illness    Social Work assessment/plan:   CSW received a referral for non-compliance with medication. CSW met with patient, introduced self, explained role, and offered support. CSW also addressed CHF management and discussed the importance of medication adherence. THC patient reported that she stopped taking her medications because she didn't like having to go to the bathroom all the time. Patient, however, did report that she has learned her lesson and will be compliant with all her medications. CSW reviewed all CHF management guidelines and discussed the importance of following all guidelines. CSW used teach back and patient was able to answer all questions correctly about her CHF management. CSW will continue to follow and assist with any d/c needs   Depression:   Patient did report feeling depressed but attributed this to not sleeping for "2 years" .CSW administered the PHQ9 and the patient scored an 11.  Patient currently sees a psychiatrist every two weeks for f/u. CSW stressed the importance of continuing her treatment with her psychiatrist. Patient did report feelings of being better off dead some of the time. Patient, however, had no thoughts, or plans  of suicide. CSW asked patient again about any thoughts of suicide and patient once again reported no thoughts or plans. Patient reported that she feels frusterated with her health condition and has had trouble finding people to help her which makes her feel depressed. CSW. CSW referred patient to continue to f/u with her psychiatrist for treatment.    PHQ-9:  11   Home health:   Home health choice:    Referrals/Interventions  Psychosocial Support/Ongoing Assessment of Needs   Other Referrals/interventions:   CM- Home Health   Patient's/Family's response to plan of care:   Patient was appreciative of suuport and information provided by CSW. CSW will continue to follow and assist as needed.   Sabino Niemann, MSW, [employer] [phone]

## 2011-10-06 DIAGNOSIS — F329 Major depressive disorder, single episode, unspecified: Secondary | ICD-10-CM

## 2011-10-06 DIAGNOSIS — M069 Rheumatoid arthritis, unspecified: Secondary | ICD-10-CM

## 2011-10-06 DIAGNOSIS — F259 Schizoaffective disorder, unspecified: Secondary | ICD-10-CM

## 2011-10-06 DIAGNOSIS — F32A Depression, unspecified: Secondary | ICD-10-CM

## 2011-10-06 DIAGNOSIS — E876 Hypokalemia: Secondary | ICD-10-CM

## 2011-10-06 DIAGNOSIS — N289 Disorder of kidney and ureter, unspecified: Secondary | ICD-10-CM

## 2011-10-06 LAB — BASIC METABOLIC PANEL
BUN: 18 mg/dL (ref 6–23)
Chloride: 101 mEq/L (ref 96–112)
Creatinine, Ser: 1.04 mg/dL (ref 0.50–1.10)
GFR calc Af Amer: 71 mL/min — ABNORMAL LOW (ref >90)
Glucose, Bld: 120 mg/dL — ABNORMAL HIGH (ref 70–99)

## 2011-10-06 MED ORDER — POTASSIUM CHLORIDE CRYS ER 20 MEQ PO TBCR: 40.0000 meq | EXTENDED_RELEASE_TABLET | Freq: Two times a day (BID) | ORAL | Status: DC

## 2011-10-06 MED ORDER — SPIRONOLACTONE 12.5 MG HALF TABLET: 12.5000 mg | ORAL_TABLET | Freq: Every day | ORAL | Status: DC

## 2011-10-06 MED ORDER — TORSEMIDE 100 MG PO TABS: 100.0000 mg | ORAL_TABLET | Freq: Every day | ORAL | Status: DC

## 2011-10-06 MED ORDER — SPIRONOLACTONE 12.5 MG HALF TABLET
12.5000 mg | ORAL_TABLET | Freq: Every day | ORAL | Status: DC
  Administered 2011-10-06: 12.5 mg via ORAL
  Filled 2011-10-06: qty 1

## 2011-10-06 MED ORDER — CARVEDILOL 6.25 MG PO TABS: 6.2500 mg | ORAL_TABLET | Freq: Two times a day (BID) | ORAL | Status: DC

## 2011-10-06 NOTE — Progress Notes (Signed)
Patient ID: [name], female   DOB: 25-Mar-1961, 51 y.o.   MRN: [id number]     Subjective:    [name] is 51 yo woman with a h/o morbid obesity, schizophrenia/schizoaffective disorder, RA, PUD and systolic CHF (EF 40-98%) probably secondary to severe MR now s/p MV repair. Last echo showed EF 30-35%.   Admitted from HF clinic 5/20 due to volume overload related to medication noncompliance. Weight up about 13 pounds. Diuresed well this admission and weight down 11 pounds.  Denies SOB/PND/Orthopnea. Wants to go home    Objective:   Weight Range:  Vital Signs:   Temp:  [98.3 F (36.8 C)-98.7 F (37.1 C)] 98.3 F (36.8 C) (05/23 0610) Pulse Rate:  [73-95] 95  (05/23 0610) Resp:  [20] 20  (05/23 0610) BP: (98-119)/(64-81) 118/73 mmHg (05/23 0610) SpO2:  [98 %-99 %] 99 % (05/23 0610) Weight:  [349 lb 6.9 oz (158.5 kg)] 349 lb 6.9 oz (158.5 kg) (05/23 0610) Last BM Date: 10/05/11  Weight change: Filed Weights   10/04/11 0454 10/05/11 0551 10/06/11 0610  Weight: 355 lb 9.6 oz (161.3 kg) 348 lb 12.3 oz (158.2 kg) 349 lb 6.9 oz (158.5 kg)    Intake/Output:   Intake/Output Summary (Last 24 hours) at 10/06/11 0721 Last data filed at 10/06/11 0616  Gross per 24 hour  Intake   1013 ml  Output   1300 ml  Net   -287 ml     Physical Exam: General:  Well appearing No respiratory distress HEENT: normal Neck: supple. JVP 7 . Carotids 2+ bilat; no bruits. No lymphadenopathy or thryomegaly appreciated. Cor: PMI nondisplaced. Regular rate & rhythm. S3 No rubs  or murmurs. Lungs: clear Abdomen: soft, obese, nontender, distended. No hepatosplenomegaly. No bruits or masses. Good bowel sounds. Extremities: no cyanosis, clubbing, rash, R and LLE trace edema Neuro: alert & orientedx3, cranial nerves grossly intact. moves all 4 extremities w/o difficulty. Affect pleasant  Telemetry: Sinus rhythm  Labs: Basic Metabolic Panel:  Lab 10/05/11 1191 10/04/11 0610 10/03/11 1540  NA 138  140 141  K 3.3* 3.4* 3.3*  CL 102 104 106  CO2 27 26 27   GLUCOSE 117* 111* 91  BUN 10 8 6   CREATININE 0.85 0.80 0.77  CALCIUM 9.1 8.9 9.1  MG -- -- 1.9  PHOS -- -- --    Liver Function Tests: No results found for this basename: AST:5,ALT:5,ALKPHOS:5,BILITOT:5,PROT:5,ALBUMIN:5 in the last 168 hours No results found for this basename: LIPASE:5,AMYLASE:5 in the last 168 hours No results found for this basename: AMMONIA:3 in the last 168 hours  CBC:  Lab 10/03/11 1540  WBC 5.3  NEUTROABS --  HGB 10.3*  HCT 33.3*  MCV 77.1*  PLT 353    Cardiac Enzymes: No results found for this basename: CKTOTAL:5,CKMB:5,CKMBINDEX:5,TROPONINI:5 in the last 168 hours  BNP: BNP (last 3 results)  Basename 10/03/11 1607 07/21/11 0525 07/14/11 1716  PROBNP 715.5* 56.2 1324.0*     Other results:    Imaging: No results found.   Medications:     Scheduled Medications:    . aspirin EC  81 mg Oral Daily  . carvedilol  6.25 mg Oral BID WC  . enoxaparin (LOVENOX) injection  80 mg Subcutaneous Q24H  . isosorbide dinitrate  20 mg Oral TID   And  . hydrALAZINE  75 mg Oral Q8H  . losartan  100 mg Oral Daily  . potassium chloride  40 mEq Oral BID  . potassium chloride  40 mEq Oral Once  .  sodium chloride  3 mL Intravenous Q12H  . spironolactone  12.5 mg Oral Daily  . torsemide  100 mg Oral Daily  . DISCONTD: furosemide  80 mg Intravenous Q8H    Infusions:    PRN Medications: sodium chloride, acetaminophen, ondansetron (ZOFRAN) IV, sodium chloride, zolpidem   Assessment:  1. Acute/Chronic Systolic Heart Failure (EF 25-30%)  2. Acute/Chronic respiratory failure  3. OSA  4. Severe MR, s/p MV repair 2010  5. Morbid Obesity  6. Acute/Chronic renal failure  7. Gout  8. Schizophrenia  9. Depression 10. Hypokalemia   Plan/Discussion:   Volume status improved. Down 11 pounds. Continue po torsemide and current doses of losartan, Coreg, isordil, and hydralazine.  Will add  spironolactone given hypokalemia.  She is compliant with office followup.  Discharge today.   Followup with me or CHF clinic in 1 week with a BMET.    Needs HHRN arranged by care management.   Cardiac meds: torsemide 100 daily, KCl 40 bid, ASA 81, Coreg 6.25 mg bid, isordil 20 tid, hydralazine 75 tid, losartan 100 daily, spironolactone 12.5 daily.    Length of Stay: 3  [name] 10/06/2011, 7:21 AM

## 2011-10-06 NOTE — Progress Notes (Signed)
Went over all discharge teaching with pt. Says she has a scale @ home & did not weigh everyday. Pt instructed and knows importance of why to weigh everyday  and 3/5 lb rules about calling MD office. Aware of home meds, schedule, follow appts, and given 2 gm NA sodium diet print out and Heart failure booklet. D/C IV and tele. Discharged per W/C with all belongings. Accompanied by friend and Agricultural consultant. T Deasiah Hagberg

## 2011-10-06 NOTE — Care Management Note (Signed)
    Page 1 of 2   10/06/2011     10:18:16 AM   CARE MANAGEMENT NOTE 10/06/2011  Patient:  [name], [name]   Account Number:  [id number]  Date Initiated:  10/06/2011  Documentation initiated by:  Tera Mater  Subjective/Objective Assessment:   51yo female admitted with SOB.  Pt. lives with children.     Action/Plan:   Spoke with pt. about HH services and gave choice.  Pt. chose [facility].   Anticipated DC Date:  10/06/2011   Anticipated DC Plan:  HOME W HOME HEALTH SERVICES      DC Planning Services  CM consult      [facility] Choice  HOME HEALTH   Choice offered to / List presented to:  C-1 Patient        HH arranged  HH-10 DISEASE MANAGEMENT  HH-1 RN      [facility] agency  [facility]   Status of service:  Completed, signed off Medicare Important Message given?   (If response is "NO", the following Medicare IM given date fields will be blank) Date Medicare IM given:   Date Additional Medicare IM given:    Discharge Disposition:  HOME W HOME HEALTH SERVICES  Per UR Regulation:  Reviewed for med. necessity/level of care/duration of stay  If discussed at Long Length of Stay Meetings, dates discussed:    Comments:  10/06/11 1000 TC to Hilda Lias with [facility] to make referral for [facility] RN for HF management.  In addition, [place] with [facility], stated pt. had the cord ready for pick up at the store to fix her CPAP.  Gave this information to pt. and she stated she was going to the store to pick up the cord.  Pt. will discharge today home. Tera Mater, RN, BSN NCM [phone]

## 2011-10-06 NOTE — Discharge Instructions (Signed)
PLEASE REMEMBER TO BRING ALL OF YOUR MEDICATIONS TO EACH OF YOUR FOLLOW-UP OFFICE VISITS.  PLEASE ATTEND ALL FOLLOW-UP APPOINTMENTS.   PLEASE TAKE ALL NEW MEDICATIONS/MEDICATIONS AS PRESCRIBED.  PLEASE MONITOR DAILY WEIGHTS, SALT INTAKE, SWELLING AND BREATHING AS DISCUSSED THIS ADMISSION AND OUTLINED BELOW.   Heart Failure Heart failure happens when the heart muscle does not work normally. This means your heart does not pump blood efficiently for your body to work well. This may cause blood to back up into the lungs or cause your lower legs to swell. Heart failure is a long-term (chronic) condition. It is important for you to take good care of yourself and follow your caregiver's treatment plan.     SYMPTOMS   Shortness of breath with mild exercise or while at rest.   A persistent cough.   Abnormal swelling in the feet and legs.   Unexplained sudden weight gain.   Fatigue and loss of energy.   Feeling lightheaded or close to fainting.   Chest or abdominal pain.  CAUSES   Coronary artery disease.   High blood pressure.   Heart attacks.   Abnormal heart valves.   Lung disease.   Diabetes.  MEDICAL EVALUATION MAY REQUIRE:  Electrocardiogram (EKG).   Chest X-ray.   Blood tests.   Exercise stress test.   Echocardiogram.   Angiogram.   Radionuclide ventriculography.  The symptoms of heart failure can improve with proper treatment. Medications, lifestyle changes, or surgical intervention may be necessary to treat heart failure. Heart medications prescribed include those that lower blood pressure, strengthen contractility, and improve circulation. A diuretic or "water pill" may also be prescribed so excess fluid can be removed from the body. Surgical intervention may include procedures that open blocked arteries or repair damaged heart valves. In some cases, a pacemaker is need to help the heart pump better. Lifestyle changes include quitting smoking, eating a healthy  diet, limiting salt intake and limiting alcoholic drinks. Exercise as told by your caregiver.  Salt increases fluid retention and should be avoided. Stay away from food made with a lot of salt. For example, stay away from chips, pretzels, pickles, olives, and canned foods. Eat a 1500 milligram salt (sodium) diet or as told by your doctor. Weigh yourself each morning after you urinate but before you eat breakfast. Keep a record of your weight to show your caregiver. A sudden weight gain can mean fluid buildup. Tell your caregiver if you gain 3 or more pounds (1.4 kilograms) in a day or 5 pounds (2.3 kilograms) in a week.  Make a list of every medicine, vitamin, or herbal supplement you are taking. Keep the list with you at all times. Show it to your caregiver at every visit. Keep the list up-to-date. Ask your caregiver or pharmacist to write an explanation of each medicine you are taking. SEEK IMMEDIATE MEDICAL CARE IF:  You develop increased breathing, shortness of breath, severe cough or are coughing up blood.   You have severe chest or abdominal pain, or unusual heart palpitations.   You develop weakness or numbness in your face or on one side of the body.  Document Released: 06/09/2004 Document Revised: 04/21/2011 Document Reviewed: 12/13/2007 [facility] Patient Information 2012 [place], [place].

## 2011-10-06 NOTE — Plan of Care (Signed)
Problem: Phase I Progression Outcomes Goal: EF % per last Echo/documented,Core Reminder form on chart Outcome: Completed/Met Date Met:  10/06/11 Last EF 30 to 35 %

## 2011-10-06 NOTE — Progress Notes (Signed)
Clinical Social Worker will sign off for now as social work intervention is no longer needed. Please consult us again if new need arises.   Kashawn Dirr, MSW, LCSWA [phone] 

## 2011-10-06 NOTE — Discharge Summary (Signed)
Discharge Summary   Patient ID: [name],  MRN: [id number], DOB/AGE: [birth year] 50 y.o.  Admit date: 10/03/2011 Discharge date: 10/06/2011  Discharge Diagnoses  Principal Problem:   1. Acute on chronic systolic heart failure  - Echo 07/15/11: EF 25-30%, diffuse hypokinesis, no wall thickness, mild LA dilatation, no evidence of MS  - pBNP 715.5 on admission   - Diuresed with Lasix IV and Torsemide PO  - Sodium restricted  - Admission weight 163.4 kg (~360 lbs), discharge weight 158.5 kg (~ 349 lbs)  - Net I/O: -1341 cc  - Continued on ASA, Losartan, Isordil, hydralazine  - Spironolactone added  - Torsemide PO increased  - Coreg PO decreased  - To follow-up with DM at the office on 10/11/11, BMET to be drawn at that time  - CSW consult re-affirmed heart failure compliance and education   - CM to address [facility] RN needs, heart failure home screening  Active Problems:    2. Acute renal insufficiency  - Cardiorenal; resolved  - Cr 1.25 on admission --> 0.77 to 1.04 during admission   3. Hypokalemia  - Diuretic-induced; K+ 3.3 on 08/05/11  - Supplemented accordingly  - BMET on 10/11/11 follow-up to re-assess given medication changes   4. MITRAL REGURGITATION, SEVERE  - S/p MV repair 2010; stable   5. SYMPTOM, APNEA, SLEEP NOS  - CPAP broken at home  - CM to address prior to discharge   6. Hypertension  - Well-controlled throughout admission   7. Morbid obesity  - Stressed importance of weight loss   8. Gout  - Stable, no flares this admission   9. RA  - Stable, no flares this admission   10. Schizoaffective disorder  - Stable this admission   11. Depression  - Stable this admission  Allergies Allergies  Allergen Reactions  . Colchicine Itching and Other (See Comments)    Whelps.  . Morphine Itching  . Risperidone Other (See Comments)    Auditory hallucinations.    Diagnostic Studies/Procedures  None  History of Present Illness  [name] is a 51yo female with the above problem list who was admitted to [facility] from the heart failure clinic on 10/03/11 with acute on chronic systolic CHF.  She was found to be volume overloaded on her follow-up appointment. She admittedly had not been compliant with her medications due to having to go to the bathroom frequently. She endorsed progressive DOE, shortness of breath, orthopnea, PND. This affected her sleep. She denied CPAP due to it being broken. She denied monitoring with daily weights.    Hospital Course   She was subsequently admitted for acute on chronic systolic CHF. pBNP was found to be elevated as above. Her weight was increased as above. She was started on Lasix IV with good diuresis. This was eventually transitioned to Torsemide PO. Coreg was decreased to allow for more room from a BP standpoint to increase the patient's Torsemide and add spironolactone. Outpatient medications were resumed. She was noted to have acute renal insufficiency likely secondary to cardiorenal syndrome as this quickly improved with diuresis. She was found to be mildly hypokalemic this admission which was appropriately supplemented to resolution. Vital signs remained stable throughout the admission without acute events. To improve compliance, CSW was consulted and provided heart failure education. Case management is currently working on home health RN services to assess heart failure home screening. Additionally, a functioning CPAP machine is being arranged.  She is stable, at baseline  and will be discharged home today with the below medication changes and follow-up appointments. This information, including supplemental heart failure education, has been clearly outlined in the discharge AVS.   Discharge Vitals:  Blood pressure 118/73, pulse 95, temperature 98.3 F (36.8 C), temperature source Oral, resp. rate 20, height 5\' 6"  (1.676 m), weight 158.5 kg (349 lb 6.9 oz), SpO2 99.00%.   Weight  change: - 4.9 kg  Labs: Recent Labs  Basename 10/03/11 1540   WBC 5.3   HGB 10.3*   HCT 33.3*   MCV 77.1*   PLT 353    Lab 10/06/11 0638 10/05/11 0550 10/04/11 0610  NA 137 138 140  K 4.0 3.3* 3.4*  CL 101 102 104  CO2 24 27 26   BUN 18 10 8   CREATININE 1.04 0.85 0.80  CALCIUM 9.1 9.1 8.9  PROT -- -- --  BILITOT -- -- --  ALKPHOS -- -- --  ALT -- -- --  AST -- -- --  AMYLASE -- -- --  LIPASE -- -- --  GLUCOSE 120* 117* 111*    Basename 10/03/11 1540  TSH 0.613  T4TOTAL --  T3FREE --  THYROIDAB --   Disposition:  Discharge Orders    Future Appointments: Provider: Department: Dept Phone: Center:   10/11/2011 2:45 PM Laurey Morale, MD [facility] [phone] [facility]   10/11/2011 3:00 PM [facility] [place] [phone] [facility]     Follow-up Information    Follow up with [name], MD on 10/11/2011. (at 2:45)    Contact information:   [address] [place] [address] [phone]         Discharge Medications:  Medication List  As of 10/06/2011  8:59 AM   START taking these medications         potassium chloride SA 20 MEQ tablet   Commonly known as: K-DUR,KLOR-CON   Take 2 tablets (40 mEq total) by mouth 2 (two) times daily.      spironolactone 12.5 mg Tabs   Commonly known as: ALDACTONE   Take 0.5 tablets (12.5 mg total) by mouth daily.         CHANGE how you take these medications         carvedilol 6.25 MG tablet   Commonly known as: COREG   Take 1 tablet (6.25 mg total) by mouth 2 (two) times daily with a meal.   What changed: - medication strength - dose - how often to take the med      torsemide 100 MG tablet   Commonly known as: DEMADEX   Take 1 tablet (100 mg total) by mouth daily.   What changed: - medication strength - dose - how often to take the med - doctor's instructions         CONTINUE taking these medications         aspirin EC  81 MG tablet      esomeprazole 40 MG capsule   Commonly known as: NEXIUM      hydrALAZINE 25 MG tablet   Commonly known as: APRESOLINE      isosorbide dinitrate 20 MG tablet   Commonly known as: ISORDIL      losartan 100 MG tablet   Commonly known as: COZAAR      zolpidem 10 MG tablet   Commonly known as: AMBIEN          Where to get your medications  These are the prescriptions that you need to pick up. We sent them to a specific pharmacy, so you will need to go there to get them.   [facility] [address], [place] - [address]    [address] [place]  [address]    Phone: [phone]        carvedilol 6.25 MG tablet   potassium chloride SA 20 MEQ tablet   spironolactone 12.5 mg Tabs   torsemide 100 MG tablet           Outstanding Labs/Studies: BMET 10/11/11  Duration of Discharge Encounter: Greater than 30 minutes including physician time.  Signed, R. Hurman Horn, PA-C 10/06/2011, 8:59 AM

## 2011-10-07 NOTE — Progress Notes (Signed)
Utilization Review Completed.Dorcas Carrow T5/24/2013

## 2011-10-11 ENCOUNTER — Encounter: Payer: Self-pay | Admitting: Cardiology

## 2011-10-11 ENCOUNTER — Other Ambulatory Visit (INDEPENDENT_AMBULATORY_CARE_PROVIDER_SITE_OTHER): Payer: Medicare Other

## 2011-10-11 ENCOUNTER — Ambulatory Visit (INDEPENDENT_AMBULATORY_CARE_PROVIDER_SITE_OTHER): Payer: Medicare Other | Admitting: Cardiology

## 2011-10-11 VITALS — BP 94/60 | HR 87 | Ht 66.0 in | Wt 350.0 lb

## 2011-10-11 DIAGNOSIS — I5022 Chronic systolic (congestive) heart failure: Secondary | ICD-10-CM

## 2011-10-11 DIAGNOSIS — R0602 Shortness of breath: Secondary | ICD-10-CM

## 2011-10-11 DIAGNOSIS — I059 Rheumatic mitral valve disease, unspecified: Secondary | ICD-10-CM

## 2011-10-11 NOTE — Patient Instructions (Signed)
Your physician recommends that you have ab work today--BMET/BNP  Your physician has requested that you have an echocardiogram. Echocardiography is a painless test that uses sound waves to create images of your heart. It provides your doctor with information about the size and shape of your heart and how well your heart's chambers and valves are working. This procedure takes approximately one hour. There are no restrictions for this procedure. June 2013  Dr Shirlee Latch will refer you to [facility] for a home health nurse to visit you at home to help with your medications and weights.  Your physician recommends that you schedule a follow-up appointment in: 1 month with Dr Shirlee Latch.

## 2011-10-12 ENCOUNTER — Other Ambulatory Visit (HOSPITAL_COMMUNITY): Payer: Self-pay | Admitting: *Deleted

## 2011-10-12 LAB — BASIC METABOLIC PANEL
BUN: 17 mg/dL (ref 6–23)
CO2: 29 mEq/L (ref 19–32)
Calcium: 8.8 mg/dL (ref 8.4–10.5)
Chloride: 104 mEq/L (ref 96–112)
Creatinine, Ser: 0.9 mg/dL (ref 0.4–1.2)

## 2011-10-12 MED ORDER — SPIRONOLACTONE 25 MG PO TABS: 12.5000 mg | ORAL_TABLET | Freq: Every day | ORAL | Status: DC

## 2011-10-13 NOTE — Assessment & Plan Note (Signed)
Patient is s/p MV repair.  No MR and only minimal MS on last echo. 

## 2011-10-13 NOTE — Assessment & Plan Note (Signed)
NYHA class III symptoms but stable.  Nonischemic cardiomyopathy.  She had a recent admission due to noncompliance.  She is now taking all her medications.  - SBP in 90s today, no lightheadedness.  Continue current doses of Coreg, hydralazine, isordil, losartan, and spironolactone.  - Will get echo in 6/13; if EF still low, will be > 6 months of medical therapy in setting of EF < 35%, so will refer for ICD.  - BMET/BNP today.   - I will arrange for a home health RN to help her with her meds, monitor her weight, etc.  - Followup in 1 month.

## 2011-10-13 NOTE — Progress Notes (Signed)
PCP: [facility]  51 yo with systolic CHF probably secondary to severe MR now s/p MV repair, rheumatoid arthritis, schizophrenia, and PUD presents for followup.  Last echo in 3/13 showed EF 25-30% with no MR and no significant mitral stenosis.  Patient was seen in CHF clinic in 2/13 and admitted because of volume overload.  She was diuresed and discharged on torsemide 80 qam, 40 qpm.  She was re-admitted in 3/13 after overdosing on Seroquel with hypotension.  She says she has been unable to sleep for "months" and took the extra Seroquel to try to sleep.  She is now off Seroquel and on trazodone.  She says she still cannot sleep.  She was re-admitted again earlier this month after she quit taking her cardiac meds and became short of breath.  She was restarted on her meds and diuresed.    At baseline, she is inactive due to knee pain bilaterally.  She is taking all her medications now.  She is not short of breath when walking around in her house.  She is short of breath when she walks for longer distances.  3 pillow orthopnea (chronic).    Labs (12/23/09) K 4.4, creatinine 1.1, BNP 145  Labs (9/11): K 3.9, creatinine 1.0, BNP 84, LFTs normal, HCT 29.3  Labs (10/11): K 4.0, creatinine 1.55 => 1.2, uric acid 13, TSH normal, BNP 75 => 172  Labs (11/11): BNP 110 => 105, creatinine 3.6 => 1.2 => 1.5, K 3.7  Labs (1/12): K 3.5, creatinine 1.2, BNP 70  Labs (2/12): LDL 105, HDL 39 Labs (6/12): 3.7, creatinine 1.12 Labs (11/12): K 4.8, creatinine 0.85, BNP 88 Labs (12/12): K 3.9, creatinine 1.1, BNP 32 Labs (3/13): K 4, creatinine 0.85 Labs (5/13): K 4, creatinine 1.04, BNP 715, TSH normal  Allergies:  1) ! * Colchicine  2) ! Morphine   Past History:  1. Congestive heart failure, most likely secondary to severe mitral regurgitation. TEE (6/10): EF 40%, diffuse hypokinesis, mild to moderately dilated LV, severe eccentric MR, small PFO. Cardiac MRI (6/10): EF 37% with global  hypokinesis, moderate to severe MR, no delayed enhancement (no evidence for sarcoidosis or other infiltrative disease). TTE (10/10) after MV repair: EF 25-30%, moderately dilated LV, moderate diastolic dysfunction, mildly depressed RV function, trivial MR, MV mean gradient 5 mmHg, PASP 30 mmHg. RHC (12/10): mean RA 19, PA 46/26, mean PCWP 28, CI 2.5, SVO2 63%. TTE (2/11): EF 35-40% with diffuse hypokinesis, no significant MR or MS. TTE (7/11): EF 45%, mild global hypokinesis, s/p MV repair, no mitral regurgitation, minimal mitral stenosis, PA systolic pressure 40 mmHg, mild LV dilation.  TTE (11/12): EF 30-35%, mild LV dilation, mild LVH, s/p MV repair with no regurgitation and minimal stenosis.  TTE (3/13): EF 25-30%, diffuse hypokinesis, no significant mitral stenosis by pressure halftime with mean gradient 7 mmHg across valve, no MR.  2. Severe mitral regurgitation (see above). Minimally invasive mitral valve repair on 12/18/08. She additionally had TV repair and PFO closure.  3. Microcytic anemia: EGD and colonoscopy in 12/11 did not reveal source of bleeding. Plan capsule endoscopy.  4. Gastric ulcers.  5. Morbid obesity.  6. Obstructive sleep apnea, on CPAP.  7. GERD.  8. Hypertension, controlled.  9. Rheumatoid arthritis  10. Gout.  11. Depression.  12. LHC (6/10): No angiographic CAD. Mildly dilated LV. 3+ MR. EF 40%.  13. Prior smoking, quit  14. Colitis (2/11)  15. Schizophrenia versus schizoaffective disorder: on Risperdal  16. History  of right leg DVT   Family History:  Negative for cardiac or renal disease.  No FH of Colon Cancer  Social History:  Single, 5 children. Unemployed.  Tobacco Use - Yes. Smoked < 1 ppd, quit 6/10.  Alcohol Use - no  Regular Exercise - no  Drug Use - no, hx crack-cocaine use  Daily Caffeine Use: 2 daily   Review of Systems  All systems reviewed and negative except as per HPI.  Current Outpatient Prescriptions  Medication Sig Dispense Refill  .  aspirin EC 81 MG tablet Take 81 mg by mouth daily.      . carvedilol (COREG) 6.25 MG tablet Take 1 tablet (6.25 mg total) by mouth 2 (two) times daily with a meal.  60 tablet  3  . esomeprazole (NEXIUM) 40 MG capsule Take 40 mg by mouth daily before breakfast.        . hydrALAZINE (APRESOLINE) 25 MG tablet Take 75 mg by mouth 3 (three) times daily.      . isosorbide dinitrate (ISORDIL) 20 MG tablet Take 20 mg by mouth every 8 (eight) hours.      Marland Kitchen losartan (COZAAR) 100 MG tablet Take 1 tablet (100 mg total) by mouth daily.      . potassium chloride SA (K-DUR,KLOR-CON) 20 MEQ tablet Take 2 tablets (40 mEq total) by mouth 2 (two) times daily.  60 tablet  3  . torsemide (DEMADEX) 100 MG tablet Take 1 tablet (100 mg total) by mouth daily.  30 tablet  3  . zolpidem (AMBIEN) 10 MG tablet Take 10 mg by mouth at bedtime as needed. As needed for sleep.      Marland Kitchen spironolactone (ALDACTONE) 25 MG tablet Take 0.5 tablets (12.5 mg total) by mouth daily.  30 tablet  6  . DISCONTD: QUEtiapine (SEROQUEL) 100 MG tablet Take 400 mg by mouth at bedtime.         BP 94/60  Pulse 87  Ht 5\' 6"  (1.676 m)  Wt 158.759 kg (350 lb)  BMI 56.49 kg/m2  SpO2 98% General: NAD, obese.  Neck: JVP not significantly elevated, no thyromegaly or thyroid nodule.  Lungs: Clear to auscultation bilaterally with normal respiratory effort. CV: Nondisplaced PMI.  Heart regular S1/S2, no S3/S4, no murmur.  Trace ankle edema bilaterally.  No carotid bruit.  Normal pedal pulses.  Abdomen: Soft, nontender, no hepatosplenomegaly, no distention.  Neurologic: Alert and oriented x 3.  Psych: Normal affect. Extremities: No clubbing or cyanosis.

## 2011-10-31 ENCOUNTER — Ambulatory Visit (HOSPITAL_COMMUNITY): Payer: Medicare Other | Attending: Cardiology | Admitting: Radiology

## 2011-10-31 DIAGNOSIS — I5022 Chronic systolic (congestive) heart failure: Secondary | ICD-10-CM

## 2011-10-31 DIAGNOSIS — R0602 Shortness of breath: Secondary | ICD-10-CM

## 2011-10-31 DIAGNOSIS — I10 Essential (primary) hypertension: Secondary | ICD-10-CM | POA: Insufficient documentation

## 2011-10-31 DIAGNOSIS — R0609 Other forms of dyspnea: Secondary | ICD-10-CM | POA: Insufficient documentation

## 2011-10-31 DIAGNOSIS — R0989 Other specified symptoms and signs involving the circulatory and respiratory systems: Secondary | ICD-10-CM | POA: Insufficient documentation

## 2011-10-31 DIAGNOSIS — I517 Cardiomegaly: Secondary | ICD-10-CM | POA: Insufficient documentation

## 2011-10-31 DIAGNOSIS — I509 Heart failure, unspecified: Secondary | ICD-10-CM | POA: Insufficient documentation

## 2011-10-31 NOTE — Progress Notes (Signed)
Echocardiogram performed.  

## 2011-11-04 ENCOUNTER — Other Ambulatory Visit: Payer: Self-pay | Admitting: *Deleted

## 2011-11-04 DIAGNOSIS — I5022 Chronic systolic (congestive) heart failure: Secondary | ICD-10-CM

## 2011-11-14 ENCOUNTER — Encounter: Payer: Self-pay | Admitting: Cardiology

## 2011-11-14 ENCOUNTER — Ambulatory Visit (INDEPENDENT_AMBULATORY_CARE_PROVIDER_SITE_OTHER): Payer: Medicare Other | Admitting: Cardiology

## 2011-11-14 VITALS — BP 100/88 | HR 80 | Ht 66.0 in | Wt 349.0 lb

## 2011-11-14 DIAGNOSIS — I5022 Chronic systolic (congestive) heart failure: Secondary | ICD-10-CM

## 2011-11-14 DIAGNOSIS — R0602 Shortness of breath: Secondary | ICD-10-CM

## 2011-11-14 DIAGNOSIS — I059 Rheumatic mitral valve disease, unspecified: Secondary | ICD-10-CM

## 2011-11-14 MED ORDER — SPIRONOLACTONE 25 MG PO TABS: 25.0000 mg | ORAL_TABLET | Freq: Every day | ORAL | Status: DC

## 2011-11-14 NOTE — Patient Instructions (Addendum)
Increase spironolactone to 25mg  daily.  Your physician recommends that you return for lab work in: 1 week--BMET/BNP   Your physician recommends that you schedule a follow-up appointment in: 6 weeks with Dr Shirlee Latch.

## 2011-11-15 NOTE — Progress Notes (Signed)
Patient ID: [name], female   DOB: 1961/04/12, 51 y.o.   MRN: [id number] PCP: Dala Dock (previously Audria Nine)  51 yo with systolic CHF probably secondary to severe MR now s/p MV repair, rheumatoid arthritis, schizophrenia, and PUD presents for followup.  Last echo in 6/13 showed EF 30-35% with no MR and no significant mitral stenosis.  Patient was seen in CHF clinic in 2/13 and admitted because of volume overload.  She was diuresed and discharged on torsemide 80 qam, 40 qpm.  She was re-admitted in 3/13 after overdosing on Seroquel with hypotension.  She is now off Seroquel and on trazodone.  She says she still cannot sleep.  She was re-admitted again in 5/13 after she quit taking her cardiac meds and became short of breath.  She was restarted on her meds and diuresed.    At baseline, she is inactive due to knee pain bilaterally.  She is taking all her medications now.  She is not short of breath when walking around in her house.  She is short of breath when she walks for longer distances.  3 pillow orthopnea (chronic).  Weight is down 1 lb since last appointment.   Labs (12/23/09) K 4.4, creatinine 1.1, BNP 145  Labs (9/11): K 3.9, creatinine 1.0, BNP 84, LFTs normal, HCT 29.3  Labs (10/11): K 4.0, creatinine 1.55 => 1.2, uric acid 13, TSH normal, BNP 75 => 172  Labs (11/11): BNP 110 => 105, creatinine 3.6 => 1.2 => 1.5, K 3.7  Labs (1/12): K 3.5, creatinine 1.2, BNP 70  Labs (2/12): LDL 105, HDL 39 Labs (6/12): 3.7, creatinine 1.12 Labs (11/12): K 4.8, creatinine 0.85, BNP 88 Labs (12/12): K 3.9, creatinine 1.1, BNP 32 Labs (3/13): K 4, creatinine 0.85 Labs (5/13): K 4, creatinine 1.04 => 0.9, BNP 715 => 36, TSH normal  Allergies:  1) ! * Colchicine  2) ! Morphine   Past History:  1. Congestive heart failure, most likely secondary to severe mitral regurgitation. TEE (6/10): EF 40%, diffuse hypokinesis, mild to moderately dilated LV, severe eccentric MR, small PFO. Cardiac MRI  (6/10): EF 37% with global hypokinesis, moderate to severe MR, no delayed enhancement (no evidence for sarcoidosis or other infiltrative disease). TTE (10/10) after MV repair: EF 25-30%, moderately dilated LV, moderate diastolic dysfunction, mildly depressed RV function, trivial MR, MV mean gradient 5 mmHg, PASP 30 mmHg. RHC (12/10): mean RA 19, PA 46/26, mean PCWP 28, CI 2.5, SVO2 63%. TTE (2/11): EF 35-40% with diffuse hypokinesis, no significant MR or MS. TTE (7/11): EF 45%, mild global hypokinesis, s/p MV repair, no mitral regurgitation, minimal mitral stenosis, PA systolic pressure 40 mmHg, mild LV dilation.  TTE (11/12): EF 30-35%, mild LV dilation, mild LVH, s/p MV repair with no regurgitation and minimal stenosis.  TTE (3/13): EF 25-30%, diffuse hypokinesis, no significant mitral stenosis by pressure halftime with mean gradient 7 mmHg across valve, no MR.  Echo (6/13): EF 30-35%, mildly dilated LV, mild mitral stenosis but no MR.  2. Severe mitral regurgitation (see above). Minimally invasive mitral valve repair on 12/18/08. She additionally had TV repair and PFO closure.  3. Microcytic anemia: EGD and colonoscopy in 12/11 did not reveal source of bleeding. Plan capsule endoscopy.  4. Gastric ulcers.  5. Morbid obesity.  6. Obstructive sleep apnea, on CPAP.  7. GERD.  8. Hypertension, controlled.  9. Rheumatoid arthritis  10. Gout.  11. Depression.  12. LHC (6/10): No angiographic CAD. Mildly dilated LV. 3+ MR. EF  40%.  13. Prior smoking, quit  14. Colitis (2/11)  15. Schizophrenia versus schizoaffective disorder 16. History of right leg DVT   Family History:  Negative for cardiac or renal disease.  No FH of Colon Cancer  Social History:  Single, 5 children. Unemployed.  Tobacco Use - Yes. Smoked < 1 ppd, quit 6/10.  Alcohol Use - no  Regular Exercise - no  Drug Use - no, hx crack-cocaine use  Daily Caffeine Use: 2 daily   Review of Systems  All systems reviewed and negative  except as per HPI.  Current Outpatient Prescriptions  Medication Sig Dispense Refill  . aspirin EC 81 MG tablet Take 81 mg by mouth daily.      . carvedilol (COREG) 6.25 MG tablet Take 1 tablet (6.25 mg total) by mouth 2 (two) times daily with a meal.  60 tablet  3  . esomeprazole (NEXIUM) 40 MG capsule Take 40 mg by mouth daily before breakfast.        . hydrALAZINE (APRESOLINE) 25 MG tablet Take 75 mg by mouth 3 (three) times daily. Taking 1 and 1/2 three times a day      . isosorbide dinitrate (ISORDIL) 20 MG tablet Take 20 mg by mouth every 8 (eight) hours.      Marland Kitchen losartan (COZAAR) 100 MG tablet Take 1 tablet (100 mg total) by mouth daily.      . potassium chloride SA (K-DUR,KLOR-CON) 20 MEQ tablet Take 2 tablets (40 mEq total) by mouth 2 (two) times daily.  60 tablet  3  . torsemide (DEMADEX) 100 MG tablet Take 1 tablet (100 mg total) by mouth daily.  30 tablet  3  . spironolactone (ALDACTONE) 25 MG tablet Take 1 tablet (25 mg total) by mouth daily.  30 tablet  6  . DISCONTD: QUEtiapine (SEROQUEL) 100 MG tablet Take 400 mg by mouth at bedtime.         BP 100/88  Pulse 80  Ht 5\' 6"  (1.676 m)  Wt 158.305 kg (349 lb)  BMI 56.33 kg/m2 General: NAD, obese.  Neck: JVP not significantly elevated, no thyromegaly or thyroid nodule.  Lungs: Clear to auscultation bilaterally with normal respiratory effort. CV: Nondisplaced PMI.  Heart regular S1/S2, no S3/S4, no murmur.  Trace ankle edema bilaterally.  No carotid bruit.  Normal pedal pulses.  Abdomen: Soft, nontender, no hepatosplenomegaly, no distention.  Neurologic: Alert and oriented x 3.  Psych: Normal affect. Extremities: No clubbing or cyanosis.

## 2011-11-15 NOTE — Assessment & Plan Note (Signed)
Patient is s/p MV repair.  No MR and only minimal MS on last echo.

## 2011-11-15 NOTE — Assessment & Plan Note (Signed)
NYHA class III symptoms but stable.  Nonischemic cardiomyopathy.  Overall, she is doing better and taking all her medications.  Her psychiatric issues seem to be well-controlled currently.  - Continue current doses of Coreg, hydralazine, isordil, losartan.  - Increase spironolactone to 25 mg daily with BMET/BNP in 1 week.  - EF remains low on last echo (30-35%) despite compliance with meds.  Therefore, I will refer for ICD evaluation.

## 2011-11-21 ENCOUNTER — Other Ambulatory Visit (INDEPENDENT_AMBULATORY_CARE_PROVIDER_SITE_OTHER): Payer: Medicare Other

## 2011-11-21 DIAGNOSIS — R0602 Shortness of breath: Secondary | ICD-10-CM

## 2011-11-21 DIAGNOSIS — I5022 Chronic systolic (congestive) heart failure: Secondary | ICD-10-CM

## 2011-11-21 LAB — BASIC METABOLIC PANEL
BUN: 10 mg/dL (ref 6–23)
CO2: 25 mEq/L (ref 19–32)
Calcium: 9 mg/dL (ref 8.4–10.5)
Chloride: 106 mEq/L (ref 96–112)
Creatinine, Ser: 0.8 mg/dL (ref 0.4–1.2)
Glucose, Bld: 109 mg/dL — ABNORMAL HIGH (ref 70–99)

## 2011-11-21 LAB — BRAIN NATRIURETIC PEPTIDE: Pro B Natriuretic peptide (BNP): 91 pg/mL (ref 0.0–100.0)

## 2011-12-08 ENCOUNTER — Ambulatory Visit (INDEPENDENT_AMBULATORY_CARE_PROVIDER_SITE_OTHER): Payer: Medicare Other | Admitting: Internal Medicine

## 2011-12-08 ENCOUNTER — Encounter: Payer: Self-pay | Admitting: *Deleted

## 2011-12-08 ENCOUNTER — Encounter: Payer: Self-pay | Admitting: Internal Medicine

## 2011-12-08 VITALS — BP 124/74 | HR 95 | Ht 66.0 in | Wt 352.0 lb

## 2011-12-08 DIAGNOSIS — I428 Other cardiomyopathies: Secondary | ICD-10-CM

## 2011-12-08 NOTE — Patient Instructions (Addendum)
Your physician has requested that you have an echocardiogram. Echocardiography is a painless test that uses sound waves to create images of your heart. It provides your doctor with information about the size and shape of your heart and how well your heart's chambers and valves are working. This procedure takes approximately one hour. There are no restrictions for this procedure.  Your physician recommends that you continue on your current medications as directed. Please refer to the Current Medication list given to you today.  Your doctor has scheduled you for a defibrillator on August 16,2013 We will mail your complete instructions to you  Your physician recommends that you return for lab work on August 5,2013.

## 2011-12-08 NOTE — Assessment & Plan Note (Signed)
Patient has persistent left ventricular dysfunction following surgical repair of the mitral valve tricuspid valvular disorder. She is class 2-3 symptoms as best as I can tell. She is on appropriate medical therapy.  She has stopped taking it for a while but has been on for the last couple of months. We will reassess her left ventricular function on last time prior to proceed with catheter ablation as her medications have been much more consistent of late.   Have reviewed the potential benefits and risks of ICD implantation including but not limited to death, perforation of heart or lung, lead dislodgement, infection,  device malfunction and inappropriate shocks.  The patient and family express understanding  and are willing to proceed.

## 2011-12-08 NOTE — Progress Notes (Signed)
ELECTROPHYSIOLOGY CONSULT NOTE  Patient ID: [name], MRN: [id number], DOB/AGE: 1960-05-18 51 y.o. Admit date: (Not on file) Date of Consult: 12/08/2011  Primary Physician: No primary provider on file. Primary Cardiologist: DM  Chief Complaint: ICD   HPI [name] is a 51 y.o. female  Seen for consideration of ICD implantation. 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 She also underwent tricuspid valve repair with a PFO closure   She thinks she is better but it was very challenging to get an answer for her on this. It's not clear to me why the question was difficult for her. She is not having swelling. She is short of breath with exertion and has chronic 3 pillow orthopnea  She has struggled of compliance of her medications.   Past Medical History  Diagnosis Date  . Severe mitral regurgitation     Minimally invasive vale repair on 12/18/08. Additionally TV repair  and PFO closure.  . CHF (congestive heart failure)     TEE(6/10): EF 40%  . Microcytic anemia     EGD and colonoscopy in 12/11 did not reveal source of bleeding.Plan capsule endoscopy.  . Gastric ulcer   . Morbid obesity   . Obstructive sleep apnea     on CPAP  . GERD (gastroesophageal reflux disease)   . HTN (hypertension)   . Gout   . Depression   . Colitis 2/11  . SOB (shortness of breath)   . Chronic back pain   . Chronic knee pain   . DVT (deep venous thrombosis)     right leg  . Anxiety   . Rheumatoid arthritis     "shoulders & knees"  . Sleep disturbance     07/14/11 "haven't slept in 10 months; since going to [facility]"  . Hx: UTI (urinary tract infection)   . Nocturia   . COPD (chronic obstructive pulmonary disease)   . Bronchitis   . PTSD (post-traumatic stress disorder)   . Acute-on-chronic respiratory failure   .  Schizophrenia       Surgical History:  Past Surgical History  Procedure Date  . Cardiac valve replacement 12/2008    cardiac  . Cardiac catheterization 04/20/2009, 11/04/2008  . Right knee arthroscopy 06/11/2004  . Extraction of teeth 12/04/2008  . Right mini thoracotomy for mitral valve repair and closure of [place] 12/18/2008     Home Meds: Prior to Admission medications   Medication Sig Start Date End Date Taking? Authorizing Provider  aspirin EC 81 MG tablet Take 81 mg by mouth daily.    Historical Provider, MD  carvedilol (COREG) 6.25 MG tablet Take 1 tablet (6.25 mg total) by mouth 2 (two) times daily with a meal. 10/06/11 10/05/12  Roger A Arguello, PA-C  esomeprazole (NEXIUM) 40 MG capsule Take 40 mg by mouth daily before breakfast.      Historical Provider, MD  hydrALAZINE (APRESOLINE) 25 MG tablet Take 75 mg by mouth 3 (three) times daily. Taking 1 and 1/2 three times a day 08/16/11   Dolores Patty, MD  isosorbide dinitrate (ISORDIL) 20 MG tablet Take 20 mg by mouth every 8 (eight) hours. 08/15/11 08/14/12  Amy D Clegg, NP  losartan (COZAAR) 100 MG tablet Take 1 tablet (100 mg total) by mouth daily. 08/08/11   Laurey Morale, MD  potassium chloride SA (K-DUR,KLOR-CON) 20 MEQ tablet Take 2  tablets (40 mEq total) by mouth 2 (two) times daily. 10/06/11 10/05/12  Roger A Arguello, PA-C  spironolactone (ALDACTONE) 25 MG tablet Take 1 tablet (25 mg total) by mouth daily. 11/14/11 11/13/12  Laurey Morale, MD  torsemide (DEMADEX) 100 MG tablet Take 1 tablet (100 mg total) by mouth daily. 10/06/11 10/05/12  Gery Pray, PA-C      Allergies:  Allergies  Allergen Reactions  . Colchicine Itching and Other (See Comments)    Whelps.  . Morphine Itching  . Risperidone Other (See Comments)    Auditory hallucinations.    History   Social History  . Marital Status: Single    Spouse Name: N/A    Number of Children: N/A  . Years of Education: N/A   Occupational History  . Not on  file.   Social History Main Topics  . Smoking status: Former Smoker -- 0.5 packs/day for 27 years    Types: Cigarettes    Quit date: 12/18/2008  . Smokeless tobacco: Never Used  . Alcohol Use: Yes     "stopped drinking alcohol 12/2003; did drink ~ 6 shots/wk"  . Drug Use: Yes    Special: "Crack" cocaine     "lasst cocaine 2009"  . Sexually Active: Not Currently   Other Topics Concern  . Not on file   Social History Narrative  . No narrative on file     Family History:  Negative for cardiac or renal disease.  No FH of Colon Cancer  Social History:  Single, 5 children. Unemployed.  Tobacco Use - Yes. Smoked < 1 ppd, quit 6/10.  Alcohol Use - no  Regular Exercise - no  Drug Use - no, hx crack-cocaine use  Daily Caffeine Use: 2 daily   ROS:  Please see the history of present illness.     All other systems reviewed and negative.    Physical Exam:  BP 124/74  Pulse 95  Ht 5\' 6"  (1.676 m)  Wt 352 lb (159.666 kg)  BMI 56.81 kg/m2 General: Well developed, well nourishedmorbidly obese African American age appearing  female in no acute distress. Head: Normocephalic, atraumatic, sclera non-icteric, no xanthomas, nares are without discharge. Lymph Nodes:  none Back: without scoliosis/kyphosis, no CVA tendersness Neck: Negative for carotid bruits. JVD not elevated. Lungs: Clear bilaterally to auscultation without wheezes, rales, or rhonchi. Breathing is unlabored. Heart: RRR with S1 S2. No systolic murmur , rubs, or gallops appreciated. Abdomen: Soft, non-tender, non-distended with normoactive bowel sounds. No hepatomegaly. No rebound/guarding. No obvious abdominal masses. Msk:  Strength and tone appear normal for age. Extremities: No clubbing or cyanosis. No edema.  Distal pedal pulses are 2+ and equal bilaterally. Skin: Warm and Dry Neuro: Alert and oriented X 3. CN III-XII intact Grossly normal sensory and motor function . Psych:  Responds to questions but not clearly  verbalizing understanding. Affect is flat       Labs: Cardiac Enzymes No results found for this basename: CKTOTAL:4,CKMB:4,TROPONINI:4 in the last 72 hours CBC Lab Results  Component Value Date   WBC 5.3 10/03/2011   HGB 10.3* 10/03/2011   HCT 33.3* 10/03/2011   MCV 77.1* 10/03/2011   PLT 353 10/03/2011   PROTIME: No results found for this basename: LABPROT:3,INR:3 in the last 72 hours Chemistry No results found for this basename: NA,K,CL,CO2,BUN,CREATININE,CALCIUM,LABALBU,PROT,BILITOT,ALKPHOS,ALT,AST,GLUCOSE in the last 168 hours Lipids Lab Results  Component Value Date   CHOL 157 07/05/2010   HDL 39.40 07/05/2010   LDLCALC 105* 07/05/2010  TRIG 62.0 07/05/2010   BNP Pro B Natriuretic peptide (BNP)  Date/Time Value Range Status  11/21/2011  2:53 PM 91.0  0.0 - 100.0 pg/mL Final  10/11/2011  4:25 PM 36.0  0.0 - 100.0 pg/mL Final  10/03/2011  4:07 PM 715.5* 0 - 125 pg/mL Final  07/21/2011  5:25 AM 56.2  0 - 125 pg/mL Final   Miscellaneous Lab Results  Component Value Date   DDIMER  Value: 4.06        AT THE INHOUSE ESTABLISHED CUTOFF VALUE OF 0.48 ug/mL FEU, THIS ASSAY HAS BEEN DOCUMENTED IN THE LITERATURE TO HAVE A SENSITIVITY AND NEGATIVE PREDICTIVE VALUE OF AT LEAST 98 TO 99%.  THE TEST RESULT SHOULD BE CORRELATED WITH AN ASSESSMENT OF THE CLINICAL PROBABILITY OF DVT / VTE.* 04/13/2009    Radiology/Studies:  No results found.  EKG normal intervals with lateral twave inversions  Sinus at 86

## 2011-12-16 ENCOUNTER — Encounter (HOSPITAL_COMMUNITY): Payer: Self-pay | Admitting: Pharmacy Technician

## 2011-12-19 ENCOUNTER — Ambulatory Visit (HOSPITAL_COMMUNITY): Payer: Medicare Other | Attending: Cardiology | Admitting: Radiology

## 2011-12-19 ENCOUNTER — Other Ambulatory Visit (INDEPENDENT_AMBULATORY_CARE_PROVIDER_SITE_OTHER): Payer: Medicare Other

## 2011-12-19 DIAGNOSIS — F1911 Other psychoactive substance abuse, in remission: Secondary | ICD-10-CM | POA: Insufficient documentation

## 2011-12-19 DIAGNOSIS — I369 Nonrheumatic tricuspid valve disorder, unspecified: Secondary | ICD-10-CM | POA: Insufficient documentation

## 2011-12-19 DIAGNOSIS — J4489 Other specified chronic obstructive pulmonary disease: Secondary | ICD-10-CM | POA: Insufficient documentation

## 2011-12-19 DIAGNOSIS — I059 Rheumatic mitral valve disease, unspecified: Secondary | ICD-10-CM | POA: Insufficient documentation

## 2011-12-19 DIAGNOSIS — I428 Other cardiomyopathies: Secondary | ICD-10-CM

## 2011-12-19 DIAGNOSIS — J449 Chronic obstructive pulmonary disease, unspecified: Secondary | ICD-10-CM | POA: Insufficient documentation

## 2011-12-19 DIAGNOSIS — I421 Obstructive hypertrophic cardiomyopathy: Secondary | ICD-10-CM | POA: Insufficient documentation

## 2011-12-19 NOTE — Progress Notes (Signed)
Echocardiogram performed.  

## 2011-12-20 LAB — BASIC METABOLIC PANEL
BUN: 15 mg/dL (ref 6–23)
Chloride: 99 mEq/L (ref 96–112)
Creatinine, Ser: 1.1 mg/dL (ref 0.4–1.2)
Glucose, Bld: 91 mg/dL (ref 70–99)

## 2011-12-20 LAB — PROTIME-INR: Prothrombin Time: 11.1 s (ref 10.2–12.4)

## 2011-12-20 LAB — CBC WITH DIFFERENTIAL/PLATELET
Basophils Absolute: 0 10*3/uL (ref 0.0–0.1)
Eosinophils Absolute: 0.2 10*3/uL (ref 0.0–0.7)
Eosinophils Relative: 5.2 % — ABNORMAL HIGH (ref 0.0–5.0)
Lymphs Abs: 0.7 10*3/uL (ref 0.7–4.0)
MCV: 75.1 fl — ABNORMAL LOW (ref 78.0–100.0)
Monocytes Absolute: 0.4 10*3/uL (ref 0.1–1.0)
Neutrophils Relative %: 67.2 % (ref 43.0–77.0)
Platelets: 332 10*3/uL (ref 150.0–400.0)
RDW: 17.3 % — ABNORMAL HIGH (ref 11.5–14.6)
WBC: 4.2 10*3/uL — ABNORMAL LOW (ref 4.5–10.5)

## 2011-12-29 MED ORDER — CEFAZOLIN SODIUM 10 G IJ SOLR
3.0000 g | INTRAMUSCULAR | Status: AC
  Administered 2011-12-30: 3 g via INTRAVENOUS
  Filled 2011-12-29: qty 3000

## 2011-12-29 MED ORDER — SODIUM CHLORIDE 0.9 % IR SOLN
80.0000 mg | Status: DC
  Filled 2011-12-29: qty 2

## 2011-12-30 ENCOUNTER — Encounter (HOSPITAL_COMMUNITY): Payer: Self-pay | Admitting: Anesthesiology

## 2011-12-30 ENCOUNTER — Ambulatory Visit (HOSPITAL_COMMUNITY): Payer: Medicare Other | Admitting: Anesthesiology

## 2011-12-30 ENCOUNTER — Encounter (HOSPITAL_COMMUNITY)
Admission: RE | Disposition: A | Discharge status: Home / Self Care | Payer: Self-pay | Source: Ambulatory Visit | Attending: Internal Medicine

## 2011-12-30 ENCOUNTER — Ambulatory Visit (HOSPITAL_COMMUNITY)
Admission: RE | Admit: 2011-12-30 | Discharge: 2011-12-31 | Disposition: A | Discharge status: Home / Self Care | Payer: Medicare Other | Source: Ambulatory Visit | Attending: Internal Medicine | Admitting: Internal Medicine

## 2011-12-30 DIAGNOSIS — E0590 Thyrotoxicosis, unspecified without thyrotoxic crisis or storm: Secondary | ICD-10-CM | POA: Insufficient documentation

## 2011-12-30 DIAGNOSIS — I509 Heart failure, unspecified: Secondary | ICD-10-CM | POA: Insufficient documentation

## 2011-12-30 DIAGNOSIS — J449 Chronic obstructive pulmonary disease, unspecified: Secondary | ICD-10-CM | POA: Insufficient documentation

## 2011-12-30 DIAGNOSIS — G4730 Sleep apnea, unspecified: Secondary | ICD-10-CM | POA: Insufficient documentation

## 2011-12-30 DIAGNOSIS — I5022 Chronic systolic (congestive) heart failure: Secondary | ICD-10-CM

## 2011-12-30 DIAGNOSIS — N189 Chronic kidney disease, unspecified: Secondary | ICD-10-CM | POA: Insufficient documentation

## 2011-12-30 DIAGNOSIS — I428 Other cardiomyopathies: Secondary | ICD-10-CM

## 2011-12-30 DIAGNOSIS — K219 Gastro-esophageal reflux disease without esophagitis: Secondary | ICD-10-CM | POA: Insufficient documentation

## 2011-12-30 DIAGNOSIS — J4489 Other specified chronic obstructive pulmonary disease: Secondary | ICD-10-CM | POA: Insufficient documentation

## 2011-12-30 DIAGNOSIS — I129 Hypertensive chronic kidney disease with stage 1 through stage 4 chronic kidney disease, or unspecified chronic kidney disease: Secondary | ICD-10-CM | POA: Insufficient documentation

## 2011-12-30 HISTORY — PX: IMPLANTABLE CARDIOVERTER DEFIBRILLATOR IMPLANT: SHX5473

## 2011-12-30 LAB — SURGICAL PCR SCREEN: Staphylococcus aureus: NEGATIVE

## 2011-12-30 SURGERY — IMPLANTABLE CARDIOVERTER DEFIBRILLATOR IMPLANT
Anesthesia: Monitor Anesthesia Care

## 2011-12-30 MED ORDER — ASPIRIN EC 81 MG PO TBEC
81.0000 mg | DELAYED_RELEASE_TABLET | Freq: Every day | ORAL | Status: DC
  Administered 2011-12-30 – 2011-12-31 (×2): 81 mg via ORAL
  Filled 2011-12-30 (×2): qty 1

## 2011-12-30 MED ORDER — ONDANSETRON HCL 4 MG/2ML IJ SOLN: 4.0000 mg | Freq: Three times a day (TID) | INTRAMUSCULAR | Status: DC | PRN

## 2011-12-30 MED ORDER — SODIUM CHLORIDE 0.9 % IV SOLN
INTRAVENOUS | Status: DC | PRN
  Administered 2011-12-30: 08:00:00 via INTRAVENOUS

## 2011-12-30 MED ORDER — SODIUM CHLORIDE 0.9 % IV SOLN
INTRAVENOUS | Status: AC
  Administered 2011-12-30: 50 mL via INTRAVENOUS

## 2011-12-30 MED ORDER — ISOSORBIDE DINITRATE 20 MG PO TABS
20.0000 mg | ORAL_TABLET | Freq: Three times a day (TID) | ORAL | Status: DC
  Administered 2011-12-30 – 2011-12-31 (×3): 20 mg via ORAL
  Filled 2011-12-30 (×6): qty 1

## 2011-12-30 MED ORDER — CEFAZOLIN SODIUM 1-5 GM-% IV SOLN
1.0000 g | Freq: Four times a day (QID) | INTRAVENOUS | Status: AC
  Administered 2011-12-30 – 2011-12-31 (×3): 1 g via INTRAVENOUS
  Filled 2011-12-30 (×4): qty 50

## 2011-12-30 MED ORDER — ONDANSETRON HCL 4 MG/2ML IJ SOLN
4.0000 mg | Freq: Once | INTRAMUSCULAR | Status: AC
  Administered 2011-12-30: 4 mg via INTRAVENOUS

## 2011-12-30 MED ORDER — MUPIROCIN 2 % EX OINT
TOPICAL_OINTMENT | Freq: Two times a day (BID) | CUTANEOUS | Status: DC
  Administered 2011-12-30: 07:00:00 via NASAL
  Filled 2011-12-30: qty 22

## 2011-12-30 MED ORDER — POTASSIUM CHLORIDE CRYS ER 20 MEQ PO TBCR
40.0000 meq | EXTENDED_RELEASE_TABLET | Freq: Two times a day (BID) | ORAL | Status: DC
  Administered 2011-12-30 – 2011-12-31 (×3): 40 meq via ORAL
  Filled 2011-12-30 (×4): qty 2

## 2011-12-30 MED ORDER — ONDANSETRON HCL 4 MG/2ML IJ SOLN
INTRAMUSCULAR | Status: DC | PRN
  Administered 2011-12-30: 4 mg via INTRAVENOUS

## 2011-12-30 MED ORDER — SODIUM CHLORIDE 0.9 % IV SOLN: 250.0000 mL | INTRAVENOUS | Status: DC

## 2011-12-30 MED ORDER — ONDANSETRON HCL 4 MG/2ML IJ SOLN: 4.0000 mg | Freq: Four times a day (QID) | INTRAMUSCULAR | Status: DC | PRN

## 2011-12-30 MED ORDER — NEOSTIGMINE METHYLSULFATE 1 MG/ML IJ SOLN
INTRAMUSCULAR | Status: DC | PRN
  Administered 2011-12-30: 5 mg via INTRAVENOUS

## 2011-12-30 MED ORDER — LOSARTAN POTASSIUM 50 MG PO TABS
100.0000 mg | ORAL_TABLET | Freq: Every day | ORAL | Status: DC
  Administered 2011-12-30: 100 mg via ORAL
  Filled 2011-12-30 (×2): qty 2

## 2011-12-30 MED ORDER — MUPIROCIN 2 % EX OINT
TOPICAL_OINTMENT | CUTANEOUS | Status: AC
  Filled 2011-12-30: qty 22

## 2011-12-30 MED ORDER — SODIUM CHLORIDE 0.45 % IV SOLN
INTRAVENOUS | Status: DC
  Administered 2011-12-30: 1000 mL via INTRAVENOUS

## 2011-12-30 MED ORDER — FENTANYL CITRATE 0.05 MG/ML IJ SOLN
INTRAMUSCULAR | Status: DC | PRN
  Administered 2011-12-30 (×5): 50 ug via INTRAVENOUS

## 2011-12-30 MED ORDER — SODIUM CHLORIDE 0.9 % IJ SOLN: 3.0000 mL | Freq: Two times a day (BID) | INTRAMUSCULAR | Status: DC

## 2011-12-30 MED ORDER — ACETAMINOPHEN 325 MG PO TABS
325.0000 mg | ORAL_TABLET | ORAL | Status: DC | PRN
  Administered 2011-12-30: 325 mg via ORAL
  Administered 2011-12-31: 650 mg via ORAL
  Filled 2011-12-30 (×2): qty 2

## 2011-12-30 MED ORDER — GLYCOPYRROLATE 0.2 MG/ML IJ SOLN
INTRAMUSCULAR | Status: DC | PRN
  Administered 2011-12-30: .8 mg via INTRAVENOUS

## 2011-12-30 MED ORDER — PROPOFOL 10 MG/ML IV EMUL
INTRAVENOUS | Status: DC | PRN
  Administered 2011-12-30: 200 mg via INTRAVENOUS
  Administered 2011-12-30: 50 mg via INTRAVENOUS
  Administered 2011-12-30: 20 mg via INTRAVENOUS

## 2011-12-30 MED ORDER — SODIUM CHLORIDE 0.9 % IJ SOLN: 3.0000 mL | INTRAMUSCULAR | Status: DC | PRN

## 2011-12-30 MED ORDER — SUCCINYLCHOLINE CHLORIDE 20 MG/ML IJ SOLN
INTRAMUSCULAR | Status: DC | PRN
  Administered 2011-12-30: 120 mg via INTRAVENOUS

## 2011-12-30 MED ORDER — PANTOPRAZOLE SODIUM 40 MG PO TBEC
40.0000 mg | DELAYED_RELEASE_TABLET | Freq: Every day | ORAL | Status: DC
  Administered 2011-12-30 – 2011-12-31 (×2): 40 mg via ORAL
  Filled 2011-12-30: qty 1

## 2011-12-30 MED ORDER — LIDOCAINE HCL (CARDIAC) 20 MG/ML IV SOLN
INTRAVENOUS | Status: DC | PRN
  Administered 2011-12-30: 100 mg via INTRAVENOUS

## 2011-12-30 MED ORDER — HYDRALAZINE HCL 25 MG PO TABS
37.5000 mg | ORAL_TABLET | Freq: Three times a day (TID) | ORAL | Status: DC
Start: 2011-12-30 — End: 2011-12-31
  Administered 2011-12-30 (×2): 37.5 mg via ORAL
  Filled 2011-12-30 (×5): qty 1.5

## 2011-12-30 MED ORDER — ONDANSETRON HCL 4 MG/2ML IJ SOLN
INTRAMUSCULAR | Status: AC
  Filled 2011-12-30: qty 2

## 2011-12-30 MED ORDER — CARVEDILOL 6.25 MG PO TABS
6.2500 mg | ORAL_TABLET | Freq: Two times a day (BID) | ORAL | Status: DC
  Administered 2011-12-30 – 2011-12-31 (×2): 6.25 mg via ORAL
  Filled 2011-12-30 (×5): qty 1

## 2011-12-30 MED ORDER — CHLORHEXIDINE GLUCONATE 4 % EX LIQD
60.0000 mL | Freq: Once | CUTANEOUS | Status: DC
  Filled 2011-12-30: qty 60

## 2011-12-30 MED ORDER — TORSEMIDE 100 MG PO TABS
100.0000 mg | ORAL_TABLET | Freq: Every day | ORAL | Status: DC
  Administered 2011-12-30: 100 mg via ORAL
  Filled 2011-12-30 (×2): qty 1

## 2011-12-30 MED ORDER — LIDOCAINE HCL (PF) 1 % IJ SOLN
INTRAMUSCULAR | Status: AC
  Filled 2011-12-30: qty 60

## 2011-12-30 MED ORDER — ROCURONIUM BROMIDE 100 MG/10ML IV SOLN
INTRAVENOUS | Status: DC | PRN
  Administered 2011-12-30: 30 mg via INTRAVENOUS

## 2011-12-30 MED ORDER — SPIRONOLACTONE 25 MG PO TABS
25.0000 mg | ORAL_TABLET | Freq: Every day | ORAL | Status: DC
  Administered 2011-12-30: 25 mg via ORAL
  Filled 2011-12-30 (×2): qty 1

## 2011-12-30 NOTE — Anesthesia Postprocedure Evaluation (Signed)
  Anesthesia Post-op Note  Patient: [name]  Procedure(s) Performed: Procedure(s) (LRB): IMPLANTABLE CARDIOVERTER DEFIBRILLATOR IMPLANT (N/A)  Patient Location: PACU  Anesthesia Type: General  Level of Consciousness: awake, oriented, sedated and patient cooperative  Airway and Oxygen Therapy: Patient Spontanous Breathing and Patient connected to nasal cannula oxygen  Post-op Pain: none  Post-op Assessment: Post-op Vital signs reviewed, Patient's Cardiovascular Status Stable, Respiratory Function Stable, Patent Airway, No signs of Nausea or vomiting and Pain level controlled  Post-op Vital Signs: stable  Complications: No apparent anesthesia complications

## 2011-12-30 NOTE — Transfer of Care (Signed)
Immediate Anesthesia Transfer of Care Note  Patient: [name]  Procedure(s) Performed: Procedure(s) (LRB): IMPLANTABLE CARDIOVERTER DEFIBRILLATOR IMPLANT (N/A)  Patient Location: PACU  Anesthesia Type: General  Level of Consciousness: awake and patient cooperative  Airway & Oxygen Therapy: Patient Spontanous Breathing and Patient connected to nasal cannula oxygen  Post-op Assessment: Report given to PACU RN and Post -op Vital signs reviewed and stable  Post vital signs: Reviewed and stable  Complications: No apparent anesthesia complications

## 2011-12-30 NOTE — Preoperative (Signed)
Beta Blockers   Reason not to administer Beta Blockers:Coreg taken 1700 12/29/11

## 2011-12-30 NOTE — Anesthesia Preprocedure Evaluation (Addendum)
Anesthesia Evaluation  Patient identified by MRN, date of birth, ID band Patient awake    Reviewed: Allergy & Precautions, H&P , NPO status , Patient's Chart, lab work & pertinent test results  History of Anesthesia Complications (+) AWARENESS UNDER ANESTHESIA  Airway Mallampati: I TM Distance: >3 FB Neck ROM: full    Dental   Pulmonary shortness of breath, sleep apnea , COPD         Cardiovascular hypertension, +CHF + dysrhythmias + Valvular Problems/Murmurs Rhythm:regular Rate:Normal     Neuro/Psych PSYCHIATRIC DISORDERS  Neuromuscular disease    GI/Hepatic PUD, GERD-  ,  Endo/Other  Hyperthyroidism Morbid obesity  Renal/GU CRFRenal disease     Musculoskeletal   Abdominal   Peds  Hematology   Anesthesia Other Findings   Reproductive/Obstetrics                          Anesthesia Physical Anesthesia Plan  ASA: III  Anesthesia Plan: MAC   Post-op Pain Management:    Induction: Intravenous  Airway Management Planned: Simple Face Mask  Additional Equipment:   Intra-op Plan:   Post-operative Plan:   Informed Consent: I have reviewed the patients History and Physical, chart, labs and discussed the procedure including the risks, benefits and alternatives for the proposed anesthesia with the patient or authorized representative who has indicated his/her understanding and acceptance.     Plan Discussed with: CRNA, Anesthesiologist and Surgeon  Anesthesia Plan Comments:         Anesthesia Quick Evaluation

## 2011-12-30 NOTE — Interval H&P Note (Signed)
History and Physical Interval Note:  12/30/2011 7:42 AM  [name]  has presented today for surgery, with the diagnosis of Heart block  The various methods of treatment have been discussed with the patient and family. After consideration of risks, benefits and other options for treatment, the patient has consented to  Procedure(s) (LRB): IMPLANTABLE CARDIOVERTER DEFIBRILLATOR IMPLANT (N/A) as a surgical intervention .  The patient's history has been reviewed, patient examined, no change in status, stable for surgery.  I have reviewed the patient's chart and labs.  Questions were answered to the patient's satisfaction.     Sherryl Manges  Some tranisent nausea and vomiting this am thought 2./2 denture ggrip  Now without furhter problems and no cp or sob   Will review with anesthesia MD

## 2011-12-30 NOTE — H&P (View-Only) (Signed)
 ELECTROPHYSIOLOGY CONSULT NOTE  Patient ID: [name], MRN: [id number], DOB/AGE: 50/05/1960 50 y.o. Admit date: (Not on file) Date of Consult: 12/08/2011  Primary Physician: No primary provider on file. Primary Cardiologist: DM  Chief Complaint: ICD   HPI [name] is a 51 y.o. female  Seen for consideration of ICD implantation. 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 She also underwent tricuspid valve repair with a PFO closure   She thinks she is better but it was very challenging to get an answer for her on this. It's not clear to me why the question was difficult for her. She is not having swelling. She is short of breath with exertion and has chronic 3 pillow orthopnea  She has struggled of compliance of her medications.   Past Medical History  Diagnosis Date  . Severe mitral regurgitation     Minimally invasive vale repair on 12/18/08. Additionally TV repair  and PFO closure.  . CHF (congestive heart failure)     TEE(6/10): EF 40%  . Microcytic anemia     EGD and colonoscopy in 12/11 did not reveal source of bleeding.Plan capsule endoscopy.  . Gastric ulcer   . Morbid obesity   . Obstructive sleep apnea     on CPAP  . GERD (gastroesophageal reflux disease)   . HTN (hypertension)   . Gout   . Depression   . Colitis 2/11  . SOB (shortness of breath)   . Chronic back pain   . Chronic knee pain   . DVT (deep venous thrombosis)     right leg  . Anxiety   . Rheumatoid arthritis     "shoulders & knees"  . Sleep disturbance     07/14/11 "haven't slept in 10 months; since going to [facility]"  . Hx: UTI (urinary tract infection)   . Nocturia   . COPD (chronic obstructive pulmonary disease)   . Bronchitis   . PTSD (post-traumatic stress disorder)   . Acute-on-chronic respiratory failure   .  Schizophrenia       Surgical History:  Past Surgical History  Procedure Date  . Cardiac valve replacement 12/2008    cardiac  . Cardiac catheterization 04/20/2009, 11/04/2008  . Right knee arthroscopy 06/11/2004  . Extraction of teeth 12/04/2008  . Right mini thoracotomy for mitral valve repair and closure of [place] 12/18/2008     Home Meds: Prior to Admission medications   Medication Sig Start Date End Date Taking? Authorizing Provider  aspirin EC 81 MG tablet Take 81 mg by mouth daily.    Historical Provider, MD  carvedilol (COREG) 6.25 MG tablet Take 1 tablet (6.25 mg total) by mouth 2 (two) times daily with a meal. 10/06/11 10/05/12  Roger A Arguello, PA-C  esomeprazole (NEXIUM) 40 MG capsule Take 40 mg by mouth daily before breakfast.      Historical Provider, MD  hydrALAZINE (APRESOLINE) 25 MG tablet Take 75 mg by mouth 3 (three) times daily. Taking 1 and 1/2 three times a day 08/16/11   Daniel R Bensimhon, MD  isosorbide dinitrate (ISORDIL) 20 MG tablet Take 20 mg by mouth every 8 (eight) hours. 08/15/11 08/14/12  Amy D Clegg, NP  losartan (COZAAR) 100 MG tablet Take 1 tablet (100 mg total) by mouth daily. 08/08/11   Dalton S McLean, MD  potassium chloride SA (K-DUR,KLOR-CON) 20 MEQ tablet Take 2   tablets (40 mEq total) by mouth 2 (two) times daily. 10/06/11 10/05/12  Roger A Arguello, PA-C  spironolactone (ALDACTONE) 25 MG tablet Take 1 tablet (25 mg total) by mouth daily. 11/14/11 11/13/12  Dalton S McLean, MD  torsemide (DEMADEX) 100 MG tablet Take 1 tablet (100 mg total) by mouth daily. 10/06/11 10/05/12  Roger A Arguello, PA-C      Allergies:  Allergies  Allergen Reactions  . Colchicine Itching and Other (See Comments)    Whelps.  . Morphine Itching  . Risperidone Other (See Comments)    Auditory hallucinations.    History   Social History  . Marital Status: Single    Spouse Name: N/A    Number of Children: N/A  . Years of Education: N/A   Occupational History  . Not on  file.   Social History Main Topics  . Smoking status: Former Smoker -- 0.5 packs/day for 27 years    Types: Cigarettes    Quit date: 12/18/2008  . Smokeless tobacco: Never Used  . Alcohol Use: Yes     "stopped drinking alcohol 12/2003; did drink ~ 6 shots/wk"  . Drug Use: Yes    Special: "Crack" cocaine     "lasst cocaine 2009"  . Sexually Active: Not Currently   Other Topics Concern  . Not on file   Social History Narrative  . No narrative on file     Family History:  Negative for cardiac or renal disease.  No FH of Colon Cancer  Social History:  Single, 5 children. Unemployed.  Tobacco Use - Yes. Smoked < 1 ppd, quit 6/10.  Alcohol Use - no  Regular Exercise - no  Drug Use - no, hx crack-cocaine use  Daily Caffeine Use: 2 daily   ROS:  Please see the history of present illness.     All other systems reviewed and negative.    Physical Exam:  BP 124/74  Pulse 95  Ht 5' 6" (1.676 m)  Wt 352 lb (159.666 kg)  BMI 56.81 kg/m2 General: Well developed, well nourishedmorbidly obese African American age appearing  female in no acute distress. Head: Normocephalic, atraumatic, sclera non-icteric, no xanthomas, nares are without discharge. Lymph Nodes:  none Back: without scoliosis/kyphosis, no CVA tendersness Neck: Negative for carotid bruits. JVD not elevated. Lungs: Clear bilaterally to auscultation without wheezes, rales, or rhonchi. Breathing is unlabored. Heart: RRR with S1 S2. No systolic murmur , rubs, or gallops appreciated. Abdomen: Soft, non-tender, non-distended with normoactive bowel sounds. No hepatomegaly. No rebound/guarding. No obvious abdominal masses. Msk:  Strength and tone appear normal for age. Extremities: No clubbing or cyanosis. No edema.  Distal pedal pulses are 2+ and equal bilaterally. Skin: Warm and Dry Neuro: Alert and oriented X 3. CN III-XII intact Grossly normal sensory and motor function . Psych:  Responds to questions but not clearly  verbalizing understanding. Affect is flat       Labs: Cardiac Enzymes No results found for this basename: CKTOTAL:4,CKMB:4,TROPONINI:4 in the last 72 hours CBC Lab Results  Component Value Date   WBC 5.3 10/03/2011   HGB 10.3* 10/03/2011   HCT 33.3* 10/03/2011   MCV 77.1* 10/03/2011   PLT 353 10/03/2011   PROTIME: No results found for this basename: LABPROT:3,INR:3 in the last 72 hours Chemistry No results found for this basename: NA,K,CL,CO2,BUN,CREATININE,CALCIUM,LABALBU,PROT,BILITOT,ALKPHOS,ALT,AST,GLUCOSE in the last 168 hours Lipids Lab Results  Component Value Date   CHOL 157 07/05/2010   HDL 39.40 07/05/2010   LDLCALC 105* 07/05/2010     TRIG 62.0 07/05/2010   BNP Pro B Natriuretic peptide (BNP)  Date/Time Value Range Status  11/21/2011  2:53 PM 91.0  0.0 - 100.0 pg/mL Final  10/11/2011  4:25 PM 36.0  0.0 - 100.0 pg/mL Final  10/03/2011  4:07 PM 715.5* 0 - 125 pg/mL Final  07/21/2011  5:25 AM 56.2  0 - 125 pg/mL Final   Miscellaneous Lab Results  Component Value Date   DDIMER  Value: 4.06        AT THE INHOUSE ESTABLISHED CUTOFF VALUE OF 0.48 ug/mL FEU, THIS ASSAY HAS BEEN DOCUMENTED IN THE LITERATURE TO HAVE A SENSITIVITY AND NEGATIVE PREDICTIVE VALUE OF AT LEAST 98 TO 99%.  THE TEST RESULT SHOULD BE CORRELATED WITH AN ASSESSMENT OF THE CLINICAL PROBABILITY OF DVT / VTE.* 04/13/2009    Radiology/Studies:  No results found.  EKG normal intervals with lateral twave inversions  Sinus at 86    

## 2011-12-30 NOTE — Progress Notes (Signed)
Patient states she has had BTL

## 2011-12-30 NOTE — Anesthesia Procedure Notes (Signed)
Procedure Name: Intubation Date/Time: 12/30/2011 8:47 AM Performed by: Sherie Don Pre-anesthesia Checklist: Patient identified, Emergency Drugs available, Suction available, Timeout performed and Patient being monitored Patient Re-evaluated:Patient Re-evaluated prior to inductionOxygen Delivery Method: Circle system utilized Preoxygenation: Pre-oxygenation with 100% oxygen Intubation Type: IV induction Ventilation: Mask ventilation without difficulty and Oral airway inserted - appropriate to patient size Laryngoscope Size: Mac and 4 Grade View: Grade I Tube type: Oral Number of attempts: 1 Airway Equipment and Method: Stylet Placement Confirmation: ETT inserted through vocal cords under direct vision,  positive ETCO2 and breath sounds checked- equal and bilateral Secured at: 22 cm Tube secured with: Tape Dental Injury: Teeth and Oropharynx as per pre-operative assessment

## 2011-12-30 NOTE — CV Procedure (Signed)
ZYLIE MUMAW [id number]  [id number]  Preop NW:[id number] cardiomyopathy Postop Dx same/  Procedure: single chamber ICD implantation with intraoperative defibrillation threshold testing  Following the obtaining of informed consent the patient was brought to the electrophysiology laboratory in place of the fluoroscopic table in the supine position.Anesthesia was used with the help of Dr Fanny Skates.  After routine prep and drape, lidocaine was infiltrated in the prepectoral subclavicular region and an incision was made and carried down to the layer of the prepectoral fascia using electrocautery and sharp dissection. A pocket was formed similarly.  Thereafter an attention was turned to gain access to the extrathoracic left subclavian vein which was accomplished without difficulty and without the aspiration of air or puncture of the artery. A 9 French sheath was placed for which was then passed a  single coil active fixation [employer] model [id number] defibrillator lead serial [id number] defibrillator lead which is passed under fluoroscopic guidance to the right ventricular apex. In its location the bipolar R wave was 7.6 millivolts, impedance was 741 ohms, the pacing threshold was 0.7 volts at 0.5 msec.  There was no diaphragmatic pacing at 10 V. The current of injury was brisk.  The lead was secured to the prepectoral fascia and then attached to a [employer] Jude  ICD, serial number  [id number].  Through the device, the bipolar R wave was 10. millivolts, impedance was 660 ohms, the pacing threshold was 0.75 volts at 0.5 msec. High-voltage impedance of 83 ohms.  The pocket was copiously irrigated with antibiotic containing saline solution. Hemostasis was assured, and the device and the lead was placed in the pocket and secured to the prepectoral fascia.  The wound is closed in 3 layers in normal fashion. The wound is washed dried and a benzoin Steri-Strips dressing was then applied. Neuro counts sponge counts and  instrument counts were correct at the end of the procedure according to the staff.     Ventricular fibrillation was induced using T-wave shock. After total duration of 11 seconds, a 20 joule shock was delivered through measured resistance of 80 ohms terminating  ventricular fibrillation and  restoring sinus rhythm with a little bit of  Dirty break   Patient tolerated the procedure without apparent complication      Cx: None apparenet     Sherryl Manges, MD 12/30/2011 9:59 AM

## 2011-12-31 ENCOUNTER — Ambulatory Visit (HOSPITAL_COMMUNITY): Payer: Medicare Other

## 2011-12-31 DIAGNOSIS — I5022 Chronic systolic (congestive) heart failure: Secondary | ICD-10-CM

## 2011-12-31 DIAGNOSIS — I10 Essential (primary) hypertension: Secondary | ICD-10-CM

## 2011-12-31 MED ORDER — POTASSIUM CHLORIDE CRYS ER 20 MEQ PO TBCR: 40.0000 meq | EXTENDED_RELEASE_TABLET | Freq: Two times a day (BID) | ORAL | Status: DC

## 2011-12-31 NOTE — Progress Notes (Signed)
Patient ID: [name], female   DOB: 1961-04-29, 51 y.o.   MRN: [id number]    Subjective:  Denies SSCP, palpitations or Dyspnea AICD site ok  Objective:  Filed Vitals:   12/30/11 1817 12/30/11 2130 12/31/11 0530 12/31/11 0700  BP: 100/64 108/62 97/60 93/60   Pulse:  100 80 78  Temp:  98.7 F (37.1 C) 98.8 F (37.1 C)   TempSrc:      Resp:  17 19   Height:      Weight:      SpO2:  98% 98%     Intake/Output from previous day:  Intake/Output Summary (Last 24 hours) at 12/31/11 0946 Last data filed at 12/30/11 1452  Gross per 24 hour  Intake    350 ml  Output      0 ml  Net    350 ml    Physical Exam: Affect appropriate Obese black female HEENT: normal Neck supple with no adenopathy JVP normal no bruits no thyromegaly Lungs clear with no wheezing and good diaphragmatic motion Heart:  S1/S2 no murmur, no rub, gallop or click PMI normal Abdomen: benighn, BS positve, no tenderness, no AAA no bruit.  No HSM or HJR Distal pulses intact with no bruits No edema Neuro non-focal Skin warm and dry No muscular weakness AICD site ok no hematoma    Imaging: Dg Chest 2 View  12/31/2011  *RADIOLOGY REPORT*  Clinical Data: Status post ICD implantation.  CHEST - 2 VIEW  Comparison: Single view of the chest 08/03/2011.  Findings: Single lead ICD is in place with the tip in the apex of the right ventricle.  Mild linear atelectasis or scar is seen in the right upper lobe.  The lungs otherwise appear clear.  There is no pneumothorax or pleural fluid.  Cardiomegaly is noted.  IMPRESSION: ICD in good position.  No acute finding.  Original Report Authenticated By: Bernadene Bell. Maricela Curet, M.D.    Cardiac Studies:  ECG:     Telemetry:  NSR no VT 12/31/2011   Echo:  Reviewed 12/19/11    Study Conclusions  - Left ventricle: Poor acoustic windows limit study. Overall LVEF appears severely depressed at approximately 30% with diffuse hypokinesis. The cavity size was normal.  Wall thickness was increased in a pattern of mild LVH. Doppler parameters are consistent with abnormal left ventricular relaxation (grade 1 diastolic dysfunction). - Mitral valve: MV leaflets are thickened. s/p MV repair. - Left atrium: The atrium was mildly dilated.     Medications:     . aspirin EC  81 mg Oral Daily  . carvedilol  6.25 mg Oral BID WC  .  ceFAZolin (ANCEF) IV  1 g Intravenous Q6H  . hydrALAZINE  37.5 mg Oral TID  . isosorbide dinitrate  20 mg Oral Q8H  . losartan  100 mg Oral Daily  . mupirocin ointment   Nasal BID  . pantoprazole  40 mg Oral Daily  . potassium chloride SA  40 mEq Oral BID  . spironolactone  25 mg Oral Daily  . torsemide  100 mg Oral Daily  . DISCONTD: chlorhexidine  60 mL Topical Once  . DISCONTD: gentamicin irrigation  80 mg Irrigation On Call  . DISCONTD: sodium chloride  3 mL Intravenous Q12H       . sodium chloride 50 mL (12/30/11 1452)  . DISCONTD: sodium chloride 1,000 mL (12/30/11 0645)  . DISCONTD: sodium chloride      Assessment/Plan:  AICD:  Normal implant CXR ok  Normal wound care CHF:  Stable continue current dose of lasix  D/C home outpatient F/U EPS  Charlton Haws 12/31/2011, 9:46 AM

## 2011-12-31 NOTE — Discharge Summary (Signed)
Physician Discharge Summary  Patient ID: [name] MRN: [id number] DOB/AGE: May 24, 1960 51 y.o.  Admit date: 12/30/2011 Discharge date: 12/31/2011  Primary Discharge Diagnosis:  1. Complete Heart Block  Secondary Discharge Diagnosis: 1. S/P ICD Pacemaker: St. Jude ICD serial number [id number]. [employer] model [id number] defibrillator lead serial [id number] on 12/30/2011  2. Nonischemic/valvular cardiomyopathy related to mitral regurg. EF of 25-30%.  3. Status post mitral valve repair with tricuspid valve repair and PFO closure  4. Chronic Systolic Heart Failure   Hospital Course: [name] is a 51 year old patient of Dr. Shirlee Latch, and Sherryl Manges admitted with heart block with history of nonischemic valvular cardiomyopathy related to mitral valve regurg. She has a history of chronic systolic heart failure. Most recent echocardiogram revealing EF of 25-30% with no MR and no significant mitral stenosis. She was admitted on 12/30/2011 for AICD placement. Manufacturer and model number as above. She tolerated procedure well without complications of bleeding hematoma or infection.   On the following day the patient was found to be hypotensive with a blood pressure of 91/63.Marland Kitchen She was seen by Dr. Eden Emms and hydralazine along with isosorbide were discontinued. She was found stable to return home with followup appointment with Dr. Shirlee Latch and Dr. Graciela Husbands in the pacemaker clinic per protocol. She has been given discharge instructions and verbal and written form concerning her pacemaker placement and post discharge care. She is also been advised not to take hydralazine and isosorbide as an outpatient. This can be readdressed on followup appointments.   Discharge Exam: Blood pressure 91/63, pulse 78, temperature 98.8 F (37.1 C), temperature source Oral, resp. rate 19, height 5\' 6"  (1.676 m), weight 350 lb (158.759 kg), SpO2 98.00%. Labs:   Lab Results  Component Value Date   WBC 4.2* 12/19/2011   HGB 11.0* 12/19/2011   HCT 34.5* 12/19/2011   MCV 75.1* 12/19/2011   PLT 332.0 12/19/2011   No results found for this basename: NA,K,CL,CO2,BUN,CREATININE,CALCIUM,LABALBU,PROT,BILITOT,ALKPHOS,ALT,AST,GLUCOSE in the last 168 hours Lab Results  Component Value Date   CKTOTAL 87 08/03/2011   CKMB 1.8 08/03/2011   TROPONINI <0.30 08/03/2011    Lab Results  Component Value Date   CHOL 157 07/05/2010   CHOL  Value: 157        ATP III CLASSIFICATION:  <200     mg/dL   Desirable  562-130  mg/dL   Borderline High  >=865    mg/dL   High        78/46/9629   CHOL  Value: 140        ATP III CLASSIFICATION:  <200     mg/dL   Desirable  528-413  mg/dL   Borderline High  >=244    mg/dL   High        0/02/2724   Lab Results  Component Value Date   HDL 39.40 07/05/2010   HDL 47 03/02/2010   HDL 43 10/31/2008   Lab Results  Component Value Date   LDLCALC 105* 07/05/2010   LDLCALC  Value: 95        Total Cholesterol/HDL:CHD Risk Coronary Heart Disease Risk Table                     Men   Women  1/2 Average Risk   3.4   3.3  Average Risk       5.0   4.4  2 X Average Risk   9.6   7.1  3 X Average Risk  23.4  11.0        Use the calculated Patient Ratio above and the CHD Risk Table to determine the patient's CHD Risk.        ATP III CLASSIFICATION (LDL):  <100     mg/dL   Optimal  161-096  mg/dL   Near or Above                    Optimal  130-159  mg/dL   Borderline  045-409  mg/dL   High  >811     mg/dL   Very High 91/47/8295   LDLCALC  Value: 86        Total Cholesterol/HDL:CHD Risk Coronary Heart Disease Risk Table                     Men   Women  1/2 Average Risk   3.4   3.3  Average Risk       5.0   4.4  2 X Average Risk   9.6   7.1  3 X Average Risk  23.4   11.0        Use the calculated Patient Ratio above and the CHD Risk Table to determine the patient's CHD Risk.        ATP III CLASSIFICATION (LDL):  <100     mg/dL   Optimal  621-308  mg/dL   Near or Above                    Optimal  130-159  mg/dL   Borderline   657-846  mg/dL   High  >962     mg/dL   Very High 9/52/8413   Lab Results  Component Value Date   TRIG 62.0 07/05/2010   TRIG 77 03/02/2010   TRIG 54 10/31/2008   Lab Results  Component Value Date   CHOLHDL 4 07/05/2010   CHOLHDL 3.3 03/02/2010   CHOLHDL 3.3 10/31/2008   No results found for this basename: LDLDIRECT      Radiology: Dg Chest 2 View  12/31/2011  *RADIOLOGY REPORT*  Clinical Data: Status post ICD implantation.  CHEST - 2 VIEW  Comparison: Single view of the chest 08/03/2011.  Findings: Single lead ICD is in place with the tip in the apex of the right ventricle.  Mild linear atelectasis or scar is seen in the right upper lobe.  The lungs otherwise appear clear.  There is no pneumothorax or pleural fluid.  Cardiomegaly is noted.  IMPRESSION: ICD in good position.  No acute finding.  Original Report Authenticated By: Bernadene Bell. D'ALESSIO, M.D.    EKG: NSR with nonspecific T-wave abnormality. Rate of 72 bpm.  FOLLOW UP PLANS AND APPOINTMENTS Discharge Orders    Future Appointments: Provider: Department: Dept Phone: Center:   01/05/2012 2:15 PM Laurey Morale, MD [facility] [phone] [facility]     Future Orders Please Complete By Expires   Diet - low sodium heart healthy      Increase activity slowly        Medication List  As of 12/31/2011 11:01 AM   STOP taking these medications         hydrALAZINE 25 MG tablet      isosorbide dinitrate 20 MG tablet         TAKE these medications         aspirin EC 81 MG tablet   Take 81 mg by mouth daily.  carvedilol 6.25 MG tablet   Commonly known as: COREG   Take 6.25 mg by mouth 2 (two) times daily with a meal.      esomeprazole 40 MG capsule   Commonly known as: NEXIUM   Take 40 mg by mouth daily before breakfast.      losartan 100 MG tablet   Commonly known as: COZAAR   Take 1 tablet (100 mg total) by mouth daily.      potassium chloride SA 20 MEQ tablet   Commonly known as: K-DUR,KLOR-CON     Take 2 tablets (40 mEq total) by mouth 2 (two) times daily.      spironolactone 25 MG tablet   Commonly known as: ALDACTONE   Take 25 mg by mouth daily.      torsemide 100 MG tablet   Commonly known as: DEMADEX   Take 100 mg by mouth daily.           Follow-up Information    Follow up with Sherryl Manges, MD. (Pacemaker clinic. Our office will call you for appointment)    Contact information:   [address] [place] [address] [phone]       Follow up with [name], MD. (Keep previously scheduled appointment,)    Contact information:   [address] [place] [address] [phone]            Time spent with patient to include physician time:40 minutes Signed: Joni Reining 12/31/2011, 11:01 AM Co-Sign MD

## 2011-12-31 NOTE — Progress Notes (Signed)
Patient ID: [name], female   DOB: 11/22/1960, 51 y.o.   MRN: [id number] Nurse notified me of low BP this am  Patient asymptomatic BMET ok not prerenal  On d/c hold nitrates and hydralazine.  Continue diuretic beta blocker and ARB Consider restarting nitrates and hydralazine at first outpatient visit if BP better  Charlton Haws 10:13 AM 12/31/2011

## 2012-01-03 ENCOUNTER — Encounter: Payer: Self-pay | Admitting: *Deleted

## 2012-01-03 DIAGNOSIS — Z95810 Presence of automatic (implantable) cardiac defibrillator: Secondary | ICD-10-CM | POA: Insufficient documentation

## 2012-01-05 ENCOUNTER — Ambulatory Visit (INDEPENDENT_AMBULATORY_CARE_PROVIDER_SITE_OTHER): Payer: Medicaid Other | Admitting: Cardiology

## 2012-01-05 ENCOUNTER — Encounter: Payer: Self-pay | Admitting: Cardiology

## 2012-01-05 VITALS — BP 128/62 | HR 93 | Ht 66.0 in | Wt 354.0 lb

## 2012-01-05 DIAGNOSIS — I059 Rheumatic mitral valve disease, unspecified: Secondary | ICD-10-CM

## 2012-01-05 DIAGNOSIS — I5022 Chronic systolic (congestive) heart failure: Secondary | ICD-10-CM

## 2012-01-05 DIAGNOSIS — E669 Obesity, unspecified: Secondary | ICD-10-CM

## 2012-01-05 DIAGNOSIS — I5032 Chronic diastolic (congestive) heart failure: Secondary | ICD-10-CM

## 2012-01-05 DIAGNOSIS — R634 Abnormal weight loss: Secondary | ICD-10-CM

## 2012-01-05 MED ORDER — HYDRALAZINE HCL 25 MG PO TABS: ORAL_TABLET | ORAL | Status: DC

## 2012-01-05 MED ORDER — ISOSORBIDE DINITRATE 10 MG PO TABS: 10.0000 mg | ORAL_TABLET | Freq: Three times a day (TID) | ORAL | Status: DC

## 2012-01-05 NOTE — Patient Instructions (Addendum)
Your physician recommends that you schedule a follow-up appointment in: 7-10 days for a wound check for your ICD device.    Your physician recommends that you schedule a follow-up appointment in: 3 months with Dr Graciela Husbands.  You have been referred to [facility] nutritionist for help with weight loss.   Your physician recommends that you schedule a follow-up appointment in: 1 month in the Heart Failure Clinic at [facility].  Your physician recommends that you schedule a follow-up appointment in: 3 months with Dr Shirlee Latch.  You have been referred to  [facility] at [facility] for your primary care.   Start hydralazine 12.5 mg three times a day. This will be one-half 25mg  tablet three times a day.  Start isosorbide 10mg  three times a day.

## 2012-01-06 NOTE — Progress Notes (Signed)
Patient ID: [name], female   DOB: 06/27/1960, 51 y.o.   MRN: [id number] PCP: [facility]   51 yo with systolic CHF probably secondary to severe MR now s/p MV repair, rheumatoid arthritis, schizophrenia, and PUD presents for followup.  Last echo in 6/13 showed EF 30-35% with no MR and no significant mitral stenosis.  Patient was seen in CHF clinic in 2/13 and admitted because of volume overload.  She was diuresed and discharged on torsemide 80 qam, 40 qpm.  She was re-admitted in 3/13 after overdosing on Seroquel with hypotension.  She was re-admitted again in 5/13 after she quit taking her cardiac meds and became short of breath.  She was restarted on her meds and diuresed.  She had a St Jude ICD placed in 8/13.   At baseline, she is inactive due to knee pain bilaterally.  She is taking all her medications now.  She is not short of breath when walking around in her house.  She is short of breath when she walks for longer distances.  3 pillow orthopnea (chronic).  She has been trying to lose weight but has not been able to exercise and weight is up 5 lbs since last appointment.   She wants gastric bypass. She was taken off hydralazine when she was in the hospital for ICD b/c of low BP.     Labs (12/23/09) K 4.4, creatinine 1.1, BNP 145  Labs (9/11): K 3.9, creatinine 1.0, BNP 84, LFTs normal, HCT 29.3  Labs (10/11): K 4.0, creatinine 1.55 => 1.2, uric acid 13, TSH normal, BNP 75 => 172  Labs (11/11): BNP 110 => 105, creatinine 3.6 => 1.2 => 1.5, K 3.7  Labs (1/12): K 3.5, creatinine 1.2, BNP 70  Labs (2/12): LDL 105, HDL 39 Labs (6/12): 3.7, creatinine 1.12 Labs (11/12): K 4.8, creatinine 0.85, BNP 88 Labs (12/12): K 3.9, creatinine 1.1, BNP 32 Labs (3/13): K 4, creatinine 0.85 Labs (5/13): K 4, creatinine 1.04 => 0.9, BNP 715 => 36, TSH normal Labs (8/13): K 4.4, creatinine 1.1  Allergies:  1) ! * Colchicine  2) ! Morphine   Past History:  1. Congestive heart failure, most  likely secondary to severe mitral regurgitation. TEE (6/10): EF 40%, diffuse hypokinesis, mild to moderately dilated LV, severe eccentric MR, small PFO. Cardiac MRI (6/10): EF 37% with global hypokinesis, moderate to severe MR, no delayed enhancement (no evidence for sarcoidosis or other infiltrative disease). TTE (10/10) after MV repair: EF 25-30%, moderately dilated LV, moderate diastolic dysfunction, mildly depressed RV function, trivial MR, MV mean gradient 5 mmHg, PASP 30 mmHg. RHC (12/10): mean RA 19, PA 46/26, mean PCWP 28, CI 2.5, SVO2 63%. TTE (2/11): EF 35-40% with diffuse hypokinesis, no significant MR or MS. TTE (7/11): EF 45%, mild global hypokinesis, s/p MV repair, no mitral regurgitation, minimal mitral stenosis, PA systolic pressure 40 mmHg, mild LV dilation.  TTE (11/12): EF 30-35%, mild LV dilation, mild LVH, s/p MV repair with no regurgitation and minimal stenosis.  TTE (3/13): EF 25-30%, diffuse hypokinesis, no significant mitral stenosis by pressure halftime with mean gradient 7 mmHg across valve, no MR.  Echo (6/13): EF 30-35%, mildly dilated LV, mild mitral stenosis but no MR.  St Jude ICD placed in 8/13.  2. Severe mitral regurgitation (see above). Minimally invasive mitral valve repair on 12/18/08. She additionally had TV repair and PFO closure.  3. Microcytic anemia: EGD and colonoscopy in 12/11 did not reveal source of bleeding.  4.  H/o gastric ulcers.  5. Morbid obesity.  6. Obstructive sleep apnea, on CPAP.  7. GERD.  8. Hypertension, controlled.  9. Rheumatoid arthritis  10. Gout.  11. Depression.  12. LHC (6/10): No angiographic CAD. Mildly dilated LV. 3+ MR. EF 40%.  13. Prior smoking, quit  14. Colitis (2/11)  15. Schizophrenia versus schizoaffective disorder 16. History of right leg DVT   Family History:  Negative for cardiac or renal disease.  No FH of Colon Cancer  Social History:  Single, 5 children. Unemployed.  Tobacco Use - Yes. Smoked < 1 ppd, quit 6/10.    Alcohol Use - no  Regular Exercise - no  Drug Use - no, hx crack-cocaine use  Daily Caffeine Use: 2 daily   Review of Systems  All systems reviewed and negative except as per HPI.  Current Outpatient Prescriptions  Medication Sig Dispense Refill  . aspirin EC 81 MG tablet Take 81 mg by mouth daily.      . carvedilol (COREG) 6.25 MG tablet Take 6.25 mg by mouth 2 (two) times daily with a meal.      . esomeprazole (NEXIUM) 40 MG capsule Take 40 mg by mouth daily before breakfast.       . losartan (COZAAR) 100 MG tablet Take 1 tablet (100 mg total) by mouth daily.      . potassium chloride SA (K-DUR,KLOR-CON) 20 MEQ tablet Take 2 tablets (40 mEq total) by mouth 2 (two) times daily.  60 tablet  6  . spironolactone (ALDACTONE) 25 MG tablet Take 25 mg by mouth daily.      Marland Kitchen torsemide (DEMADEX) 100 MG tablet Take 100 mg by mouth daily.      . hydrALAZINE (APRESOLINE) 25 MG tablet 1/2 tablet (total 12.5mg ) three times a day  45 tablet  6  . isosorbide dinitrate (ISORDIL) 10 MG tablet Take 1 tablet (10 mg total) by mouth 3 (three) times daily.  90 tablet  6  . DISCONTD: QUEtiapine (SEROQUEL) 100 MG tablet Take 400 mg by mouth at bedtime.         BP 128/62  Pulse 93  Ht 5\' 6"  (1.676 m)  Wt 354 lb (160.573 kg)  BMI 57.14 kg/m2  SpO2 98% General: NAD, obese.  Neck: JVP not significantly elevated, no thyromegaly or thyroid nodule.  Lungs: Clear to auscultation bilaterally with normal respiratory effort. CV: Nondisplaced PMI.  Heart regular S1/S2, no S3/S4, no murmur.  Trace ankle edema bilaterally.  No carotid bruit.  Normal pedal pulses.  Abdomen: Soft, nontender, no hepatosplenomegaly, no distention.  Neurologic: Alert and oriented x 3.  Psych: Normal affect. Extremities: No clubbing or cyanosis.

## 2012-01-06 NOTE — Assessment & Plan Note (Signed)
She needs to work aggressively her on weight.  She is agreeable to a nutrition consult.  I'm not sure she will be able to exercise much with her knee pain.

## 2012-01-06 NOTE — Assessment & Plan Note (Signed)
Patient is s/p MV repair.  No MR and only minimal MS on last echo. 

## 2012-01-06 NOTE — Assessment & Plan Note (Signed)
NYHA class III symptoms but stable.  Nonischemic cardiomyopathy.  Overall, she is doing better and taking all her medications.  Her psychiatric issues seem to be well-controlled currently.  She does not appear volume overloaded on exam.  I think weight gain is more related to diet and lack of exercise rather than fluid retention.  - Continue current doses of Coreg, spironolactone, torsemide, and losartan.  - I am going to put her back on hydralazine 12.5 mg tid and isordil 10 mg tid.  BP is 128/62 today.  - I will have her followup in CHF clinic in 1 month.  She can see me in 3 months.

## 2012-01-11 ENCOUNTER — Ambulatory Visit: Payer: Medicare Other

## 2012-01-23 ENCOUNTER — Encounter: Payer: Self-pay | Admitting: Internal Medicine

## 2012-01-23 ENCOUNTER — Ambulatory Visit (INDEPENDENT_AMBULATORY_CARE_PROVIDER_SITE_OTHER): Payer: Medicare Other | Admitting: *Deleted

## 2012-01-23 DIAGNOSIS — I428 Other cardiomyopathies: Secondary | ICD-10-CM

## 2012-01-23 DIAGNOSIS — I5022 Chronic systolic (congestive) heart failure: Secondary | ICD-10-CM

## 2012-01-23 LAB — ICD DEVICE OBSERVATION
BRDY-0002RV: 40 {beats}/min
BRDY-0004RV: 130 {beats}/min
FVT: 0
HV IMPEDENCE: 52 Ohm
PACEART VT: 0
TOT-0007: 1
TOT-0010: 2
TZAT-0004SLOWVT: 8
TZAT-0012SLOWVT: 200 ms
TZAT-0018SLOWVT: NEGATIVE
TZAT-0019SLOWVT: 7.5 V
TZAT-0020SLOWVT: 1 ms
TZON-0003SLOWVT: 300 ms
TZON-0004SLOWVT: 40
TZON-0005SLOWVT: 6
TZON-0010SLOWVT: 40 ms
TZST-0001SLOWVT: 3
TZST-0003SLOWVT: 875 V
VENTRICULAR PACING ICD: 0 pct
VF: 0

## 2012-01-23 NOTE — Progress Notes (Signed)
Wound check-ICD 

## 2012-01-23 NOTE — Patient Instructions (Addendum)
Follow up appointment with Dr. Graciela Husbands 04/10/12 @ 9:30am.   May shower at will.  No oils or lotions to site.  No lifting more than 10lbs. With the left arm for 2 weeks.

## 2012-01-25 ENCOUNTER — Encounter: Payer: Medicare Other | Attending: Cardiology | Admitting: Dietician

## 2012-01-25 ENCOUNTER — Encounter: Payer: Self-pay | Admitting: Dietician

## 2012-01-25 DIAGNOSIS — Z713 Dietary counseling and surveillance: Secondary | ICD-10-CM | POA: Insufficient documentation

## 2012-01-25 NOTE — Patient Instructions (Addendum)
   Don't skip meals.  Have something.  Maybe a protein bar or serving of protein and slice of bread or piece of fruit and some protein.  Drink plenty of fluids WATER!!!!  64 oz per day.  8 (8 oz glasses)   Read food labels and follow the serving sizes.  Consider the use of the diet soda, the use of a sugar substitute in your tea or kool-aid,  Use of diet juice or water with a little juice.  Always have a protein at the meal and the snack.  Try to avoid frying and fatty foods  Monitor portions, keep the grits at 1 cup.(rice, grit, noodles)  Use the serving size for the fat on the food label  Try to get into the [facility] as a patient  Try to do the Information session for Bariatric Surgery.

## 2012-01-25 NOTE — Progress Notes (Signed)
  Medical Nutrition Therapy:  Appt start time: 0915 end time:  1015.   Assessment:  Primary concerns today: Comes today for weight loss counseling.  Gives a history of needing LF knee replacement for two years and the surgeon will not do her surgery until she loses some weight. On graduating from high school weight was about 180 lbs and after having 5 babies she started to gain weight.  Today, her pain is sever and is aggravated by standing and walking.  Gives a history of being limited to ADL.  Since her appointment with the MD on 01/05/2012, she has lost 13.8 lb just by decreasing portion sizes.  She questions if Bariatric Surgery would be another option for weight control.  She approached this subject some years ago when she just had Medicaid and learned that at that time Medicaid did not cover the surgery.  She now has Medicare/Medicaid and sill contact the bariatric office to see if her insurance will cover surgery and if so will consider attending the information session.  MEDICATIONS: Medication review completed   DIETARY INTAKE:  Usual eating pattern includes: 2-3 meals and 1-2 snacks per day.  Everyday foods include: meats, vegetables, fruit, starches, sweetened beverages.  Avoided foods include none were noted..    24-hr recall:  B ( AM): 8:00-9:00 Grits 3 cups, egg  2 , balogna 2 slices /franks 2; tea sweetened 16 oz or sprite 12 oz can  Sometimes does not eat in AM  2 days per week.  Snk ( AM): none  L ( PM): Sometimes have something about 1-2 Pm chicken and noodles, sweet peas, bread 1-2 slices white bread, tea or soda   Snk ( PM): Drink some juice 12 oz, [place] Fruit punch D ( PM): 5-6:00 chili bean [employer] large chili, cracker 12 crackers, tea sweetened, 8 oz. Snk ( PM): Sometime, not often snacks in the night.  Snack cake or honey bun or pop tart Beverages: Sweetened tea, fruit punch, regular soda.   Usual physical activity: No activity, sit all day because of the pain in  knees. Unable to do housework.   Estimated energy needs:  HT:66 in  WT: 340.2  BMI: 55 kg/m2  Adj WT: 197 lb (90 kg)   1500-1600 calories 170-175 g carbohydrates 115-120 g protein 40-43 g fat  Progress Towards Goal(s):  In progress.   Nutritional Diagnosis:  [place]-3.3 Overweight/obesity As related to Very limited activity levels, increased intake of simple sugars and concentrated sweets and fatty foods..  As evidenced by weight of 340 lb with BMI of 55 kg/m2.    Intervention:  Nutrition Review of the techniques for weight loss.  Emphasized the need to not skip meals, and to have smaller portions.  If skipping a meal, have a snack to help keep the metabolic rate up.  Discussed alternatives for the regular soda, sweet tea.  With her just taking the concentrated sweets from the day will reduce her intake by about 500 calories.  Review of label reading and monitoring portion sizes.  Recommended increasing water to 64 oz per day.  Recommended getting into the [facility] for MD.  When there, seek a PT referral for water exercises and consider applying for a scholarship to the [facility].  Handouts given during visit include:  Nutritional Strategies for Weight Loss  Non-starchy vegetable list  Application to the [facility]  Bariatric Surgery Booklet  Monitoring/Evaluation:  Dietary intake, exercise, and body weight in 8 weeks.

## 2012-02-09 ENCOUNTER — Encounter (HOSPITAL_COMMUNITY): Payer: Self-pay

## 2012-02-09 ENCOUNTER — Ambulatory Visit (HOSPITAL_COMMUNITY)
Admission: RE | Admit: 2012-02-09 | Discharge: 2012-02-09 | Disposition: A | Discharge status: Home / Self Care | Payer: Medicare Other | Source: Ambulatory Visit | Attending: Internal Medicine | Admitting: Internal Medicine

## 2012-02-09 VITALS — BP 128/72 | HR 96 | Wt 365.5 lb

## 2012-02-09 DIAGNOSIS — I5022 Chronic systolic (congestive) heart failure: Secondary | ICD-10-CM | POA: Insufficient documentation

## 2012-02-09 LAB — BASIC METABOLIC PANEL
Calcium: 9.3 mg/dL (ref 8.4–10.5)
GFR calc Af Amer: 63 mL/min — ABNORMAL LOW (ref >90)
GFR calc non Af Amer: 55 mL/min — ABNORMAL LOW (ref >90)
Sodium: 135 mEq/L (ref 135–145)

## 2012-02-09 LAB — PRO B NATRIURETIC PEPTIDE: Pro B Natriuretic peptide (BNP): 76 pg/mL (ref 0–125)

## 2012-02-09 MED ORDER — CARVEDILOL 6.25 MG PO TABS: 9.3750 mg | ORAL_TABLET | Freq: Two times a day (BID) | ORAL | Status: DC

## 2012-02-09 NOTE — Progress Notes (Signed)
Patient ID: [name], female   DOB: 15-Sep-1960, 51 y.o.   MRN: [id number] PCP: [facility]   [name] is a 51 yo with systolic CHF probably secondary to severe MR now s/p MV repair, rheumatoid arthritis, schizophrenia, and PUD presents for followup.    Last echo in 6/13 showed EF 30-35% with no MR and no significant mitral stenosis.  Patient was seen in HF clinic in 2/13 and admitted because of volume overload.  She was diuresed and discharged on torsemide 80 qam, 40 qpm.  She was re-admitted in 3/13 after overdosing on Seroquel with hypotension.  She was re-admitted again in 5/13 for ADHF after she quit taking her cardiac meds.  She had a St Jude ICD placed in 8/13.  She was taken off hydralazine when she was in the hospital for ICD b/c of low BP. Now back on.   She says she is doing pretty well from a HF perspective. Denies edema or PND. Chronic 3-pillow orthopnea. Not following weight closely. Says she is taking meds almost every day. Main limitation continues to be bilateral knee pain. She is not short of breath when walking around in her house.  She is short of breath when she walks for longer distances.  3 pillow orthopnea (chronic).  Weight up in clinic about 20 pounds. Seeing a nutritionist so trying to eat right however says she is drinking 1/2 gallon of sweet tea every day.    Allergies:  1) ! * Colchicine  2) ! Morphine   Past History:  1. Congestive heart failure, most likely secondary to severe mitral regurgitation. TEE (6/10): EF 40%, diffuse hypokinesis, mild to moderately dilated LV, severe eccentric MR, small PFO. Cardiac MRI (6/10): EF 37% with global hypokinesis, moderate to severe MR, no delayed enhancement (no evidence for sarcoidosis or other infiltrative disease). TTE (10/10) after MV repair: EF 25-30%, moderately dilated LV, moderate diastolic dysfunction, mildly depressed RV function, trivial MR, MV mean gradient 5 mmHg, PASP 30 mmHg. RHC (12/10): mean RA  19, PA 46/26, mean PCWP 28, CI 2.5, SVO2 63%. TTE (2/11): EF 35-40% with diffuse hypokinesis, no significant MR or MS. TTE (7/11): EF 45%, mild global hypokinesis, s/p MV repair, no mitral regurgitation, minimal mitral stenosis, PA systolic pressure 40 mmHg, mild LV dilation.  TTE (11/12): EF 30-35%, mild LV dilation, mild LVH, s/p MV repair with no regurgitation and minimal stenosis.  TTE (3/13): EF 25-30%, diffuse hypokinesis, no significant mitral stenosis by pressure halftime with mean gradient 7 mmHg across valve, no MR.  Echo (6/13): EF 30-35%, mildly dilated LV, mild mitral stenosis but no MR.  St Jude ICD placed in 8/13.  2. Severe mitral regurgitation (see above). Minimally invasive mitral valve repair on 12/18/08. She additionally had TV repair and PFO closure.  3. Microcytic anemia: EGD and colonoscopy in 12/11 did not reveal source of bleeding.  4. H/o gastric ulcers.  5. Morbid obesity.  6. Obstructive sleep apnea, on CPAP.  7. GERD.  8. Hypertension, controlled.  9. Rheumatoid arthritis  10. Gout.  11. Depression.  12. LHC (6/10): No angiographic CAD. Mildly dilated LV. 3+ MR. EF 40%.  13. Prior smoking, quit  14. Colitis (2/11)  15. Schizophrenia versus schizoaffective disorder 16. History of right leg DVT   Family History:  Negative for cardiac or renal disease.  No FH of Colon Cancer  Social History:  Single, 5 children. Unemployed.  Tobacco Use - Yes. Smoked < 1 ppd, quit 6/10.  Alcohol  Use - no  Regular Exercise - no  Drug Use - no, hx crack-cocaine use  Daily Caffeine Use: 2 daily   Review of Systems  All systems reviewed and negative except as per HPI.  Current Outpatient Prescriptions  Medication Sig Dispense Refill  . aspirin EC 81 MG tablet Take 81 mg by mouth daily.      . carvedilol (COREG) 6.25 MG tablet Take 6.25 mg by mouth 2 (two) times daily with a meal.      . esomeprazole (NEXIUM) 40 MG capsule Take 40 mg by mouth daily before breakfast.       .  hydrALAZINE (APRESOLINE) 25 MG tablet 1/2 tablet (total 12.5mg ) three times a day  45 tablet  6  . isosorbide dinitrate (ISORDIL) 10 MG tablet Take 1 tablet (10 mg total) by mouth 3 (three) times daily.  90 tablet  6  . losartan (COZAAR) 100 MG tablet Take 1 tablet (100 mg total) by mouth daily.      . potassium chloride SA (K-DUR,KLOR-CON) 20 MEQ tablet Take 2 tablets (40 mEq total) by mouth 2 (two) times daily.  60 tablet  6  . spironolactone (ALDACTONE) 25 MG tablet Take 25 mg by mouth daily.      Marland Kitchen torsemide (DEMADEX) 100 MG tablet Take 100 mg by mouth daily.      Marland Kitchen DISCONTD: QUEtiapine (SEROQUEL) 100 MG tablet Take 400 mg by mouth at bedtime.         BP 128/72  Pulse 115  Wt 365 lb 8 oz (165.79 kg)  SpO2 99% General: NAD, obese.  Neck: JVP hard to see does not appear to be significantly elevated, no thyromegaly or thyroid nodule.  Lungs: Clear to auscultation bilaterally with normal respiratory effort. CV: Nondisplaced PMI.  Heart regular S1/S2, no S3/S4, no murmur. No edema  No carotid bruit.  Normal pedal pulses.  Abdomen: Obese Soft, nontender, no hepatosplenomegaly, no distention.  Neurologic: Alert and oriented x 3.  Psych: Normal affect. Extremities: No clubbing or cyanosis.

## 2012-02-09 NOTE — Patient Instructions (Addendum)
Increase Carvedilol to 9.375 mg (1 & 1/2 tabs) Twice daily   Your physician recommends that you schedule a follow-up appointment in: 1 month

## 2012-02-09 NOTE — Assessment & Plan Note (Addendum)
Her weight is up about 20 pounds but I do not see obvious fluid overload on exam - though exam is difficult due to her body habitus. Will check her ICD diagnostics. If significant volume overload may consider brief admission for diuresis given degree of weight gain. Reinforced need for daily weights and reviewed use of sliding scale diuretics. Also discussed need to cut back on sweet tea.  St Jude device diagnostics reviewed with rep. Relatively new implant but impedance looks to be at baseline and suggests relative euvolemia. Counseled on need to watch her calorie intake.  Asked her to call if weight continues to increase over next week. Will increase carvedilol.

## 2012-03-13 ENCOUNTER — Ambulatory Visit (HOSPITAL_COMMUNITY): Payer: Medicare Other

## 2012-03-20 ENCOUNTER — Ambulatory Visit (HOSPITAL_COMMUNITY)
Admission: RE | Admit: 2012-03-20 | Discharge: 2012-03-20 | Disposition: A | Discharge status: Home / Self Care | Payer: Medicare Other | Source: Ambulatory Visit | Attending: Internal Medicine | Admitting: Internal Medicine

## 2012-03-20 ENCOUNTER — Encounter (HOSPITAL_COMMUNITY): Payer: Self-pay

## 2012-03-20 VITALS — BP 140/92 | HR 121 | Wt 379.8 lb

## 2012-03-20 DIAGNOSIS — E669 Obesity, unspecified: Secondary | ICD-10-CM

## 2012-03-20 DIAGNOSIS — M25569 Pain in unspecified knee: Secondary | ICD-10-CM

## 2012-03-20 DIAGNOSIS — I5022 Chronic systolic (congestive) heart failure: Secondary | ICD-10-CM

## 2012-03-20 DIAGNOSIS — I10 Essential (primary) hypertension: Secondary | ICD-10-CM

## 2012-03-20 DIAGNOSIS — M25562 Pain in left knee: Secondary | ICD-10-CM

## 2012-03-20 MED ORDER — POTASSIUM CHLORIDE CRYS ER 20 MEQ PO TBCR: 40.0000 meq | EXTENDED_RELEASE_TABLET | Freq: Two times a day (BID) | ORAL | Status: DC

## 2012-03-20 MED ORDER — HYDRALAZINE HCL 25 MG PO TABS: 25.0000 mg | ORAL_TABLET | Freq: Three times a day (TID) | ORAL | Status: DC

## 2012-03-20 NOTE — Progress Notes (Signed)
PCP: none Cardiologist: Dr. Shirlee Latch  HPI: [name] is a 51 yo with systolic CHF probably secondary to severe MR now s/p MV repair, rheumatoid arthritis, schizophrenia, and PUD.     Echo in 6/13 showed EF 30-35% with no MR and no significant mitral stenosis.    Patient was seen in HF clinic in 2/13 and admitted because of volume overload.  She was diuresed and discharged on torsemide 80 qam, 40 qpm.  She was re-admitted in 3/13 after overdosing on Seroquel with hypotension.  She was re-admitted again in 5/13 for ADHF after she quit taking her cardiac meds.  She had a St Jude ICD placed in 8/13.  She was taken off hydralazine when she was in the hospital for ICD b/c of low BP. Now back on.   She returns for follow up today with her daughter and grandkids.  She is dyspneic with any ambulation.  Denies rest dyspnea.  Chronic 3 pillow orthopnea.  No PND.  Per her daughter she does nothing but sit in the same spot all day and eat.  She also drinks 1 gallon of sweet tea every couple of days as well as ginger ale.  She has not followed up with her nutritionist.  She continues to complain of knee pain.  She is only weighing sporadically at home.  Says she is following a diet but ate fried chicken, potatoes and greens for dinner last night.    ROS: All pertinent positives and negatives as in HPI, otherwise negative  Allergies:  1) ! * Colchicine  2) ! Morphine   Past History:  1. Congestive heart failure, most likely secondary to severe mitral regurgitation. TEE (6/10): EF 40%, diffuse hypokinesis, mild to moderately dilated LV, severe eccentric MR, small PFO. Cardiac MRI (6/10): EF 37% with global hypokinesis, moderate to severe MR, no delayed enhancement (no evidence for sarcoidosis or other infiltrative disease). TTE (10/10) after MV repair: EF 25-30%, moderately dilated LV, moderate diastolic dysfunction, mildly depressed RV function, trivial MR, MV mean gradient 5 mmHg, PASP 30 mmHg. RHC (12/10):  mean RA 19, PA 46/26, mean PCWP 28, CI 2.5, SVO2 63%. TTE (2/11): EF 35-40% with diffuse hypokinesis, no significant MR or MS. TTE (7/11): EF 45%, mild global hypokinesis, s/p MV repair, no mitral regurgitation, minimal mitral stenosis, PA systolic pressure 40 mmHg, mild LV dilation.  TTE (11/12): EF 30-35%, mild LV dilation, mild LVH, s/p MV repair with no regurgitation and minimal stenosis.  TTE (3/13): EF 25-30%, diffuse hypokinesis, no significant mitral stenosis by pressure halftime with mean gradient 7 mmHg across valve, no MR.  Echo (6/13): EF 30-35%, mildly dilated LV, mild mitral stenosis but no MR.  St Jude ICD placed in 8/13.  2. Severe mitral regurgitation (see above). Minimally invasive mitral valve repair on 12/18/08. She additionally had TV repair and PFO closure.  3. Microcytic anemia: EGD and colonoscopy in 12/11 did not reveal source of bleeding.  4. H/o gastric ulcers.  5. Morbid obesity.  6. Obstructive sleep apnea, on CPAP.  7. GERD.  8. Hypertension, controlled.  9. Rheumatoid arthritis  10. Gout.  11. Depression.  12. LHC (6/10): No angiographic CAD. Mildly dilated LV. 3+ MR. EF 40%.  13. Prior smoking, quit  14. Colitis (2/11)  15. Schizophrenia versus schizoaffective disorder 16. History of right leg DVT    Current Outpatient Prescriptions  Medication Sig Dispense Refill  . aspirin EC 81 MG tablet Take 81 mg by mouth daily.      Marland Kitchen  carvedilol (COREG) 6.25 MG tablet Take 1.5 tablets (9.375 mg total) by mouth 2 (two) times daily with a meal.  90 tablet  6  . esomeprazole (NEXIUM) 40 MG capsule Take 40 mg by mouth daily before breakfast.       . hydrALAZINE (APRESOLINE) 25 MG tablet 1/2 tablet (total 12.5mg ) three times a day  45 tablet  6  . isosorbide dinitrate (ISORDIL) 10 MG tablet Take 1 tablet (10 mg total) by mouth 3 (three) times daily.  90 tablet  6  . losartan (COZAAR) 100 MG tablet Take 1 tablet (100 mg total) by mouth daily.      . potassium chloride SA  (K-DUR,KLOR-CON) 20 MEQ tablet Take 2 tablets (40 mEq total) by mouth 2 (two) times daily.  60 tablet  6  . spironolactone (ALDACTONE) 25 MG tablet Take 25 mg by mouth daily.      Marland Kitchen torsemide (DEMADEX) 100 MG tablet Take 100 mg by mouth daily.      . [DISCONTINUED] QUEtiapine (SEROQUEL) 100 MG tablet Take 400 mg by mouth at bedtime.          Physical Exam:  Filed Vitals:   03/20/12 1423  BP: 140/92  Pulse: 121  Weight: 379 lb 12.8 oz (172.276 kg)  SpO2: 99%    General: NAD, obese.  Neck: JVP hard to see does not appear to be significantly elevated, no thyromegaly or thyroid nodule. No carotid bruit.  Lungs: Clear to auscultation bilaterally with normal respiratory effort. CV: Nondisplaced PMI.  Heart regular S1/S2, no S3/S4, no murmur.  Abdomen: Obese Soft, nontender, no hepatosplenomegaly, no distention.  Neurologic: Alert and oriented x 3.  Psych: Normal affect. Extremities: No clubbing or cyanosis. Normal pedal pulses.   Trace-1+ edema.

## 2012-03-20 NOTE — Patient Instructions (Addendum)
Increase hydralazine 1 tab (25 mg) three times daily.  Take metolazone 1/2 tab today with extra potassium pill.  Follow up with nutritionist.  Try to increase exercise and cut back on sweet tea/sodas.  Follow up mid December.

## 2012-03-21 MED ORDER — HYDRALAZINE HCL 25 MG PO TABS: 25.0000 mg | ORAL_TABLET | Freq: Three times a day (TID) | ORAL | Status: DC

## 2012-03-21 NOTE — Assessment & Plan Note (Addendum)
Remains NYHA III.  Weight and volume status up.  Although do not believe she has 20 pounds of fluid on board.  Will give metolazone 2.5 mg for the next 2 days.  Have re-educated on daily weights and importance of keeping track.  Will also increase hydralazine 25 mg TID.  She will follow up with Dr. Shirlee Latch in 2 weeks with BMET.    Have also had a frank discussion concerning obesity and need for weight loss.  Have asked her to look into aquatic programs like at the [facility] in order to keep stress off her knees.  She voices understanding but I am unsure if she grasp the severity of her obesity.  Have stressed importance to daughter who appears to understand issue.  She states they will look into getting her an appointment with her nutritionist again.     40 minutes spent with patient and over 50% of time used for education.

## 2012-03-21 NOTE — Assessment & Plan Note (Signed)
Have provided her with a list of free clinics as [facility] and [facility] are currently not accepting Medicare patients.  She will call to schedule follow up.

## 2012-03-21 NOTE — Assessment & Plan Note (Signed)
As above, stressed importance of weight loss and getting into a weight loss program.

## 2012-03-21 NOTE — Assessment & Plan Note (Signed)
Will increase hydralazine 25 mg TID.  Continue to follow.

## 2012-03-30 ENCOUNTER — Encounter: Payer: Self-pay | Admitting: *Deleted

## 2012-04-02 ENCOUNTER — Encounter: Payer: Self-pay | Admitting: Cardiology

## 2012-04-02 ENCOUNTER — Ambulatory Visit (INDEPENDENT_AMBULATORY_CARE_PROVIDER_SITE_OTHER): Payer: Medicare Other | Admitting: Cardiology

## 2012-04-02 VITALS — BP 122/70 | HR 90 | Ht 66.0 in | Wt 375.0 lb

## 2012-04-02 DIAGNOSIS — I5022 Chronic systolic (congestive) heart failure: Secondary | ICD-10-CM

## 2012-04-02 DIAGNOSIS — I059 Rheumatic mitral valve disease, unspecified: Secondary | ICD-10-CM

## 2012-04-02 DIAGNOSIS — R0602 Shortness of breath: Secondary | ICD-10-CM

## 2012-04-02 LAB — BASIC METABOLIC PANEL
BUN: 15 mg/dL (ref 6–23)
CO2: 28 mEq/L (ref 19–32)
Glucose, Bld: 114 mg/dL — ABNORMAL HIGH (ref 70–99)
Potassium: 4.3 mEq/L (ref 3.5–5.1)
Sodium: 137 mEq/L (ref 135–145)

## 2012-04-02 MED ORDER — METOLAZONE 2.5 MG PO TABS: ORAL_TABLET | ORAL | Status: DC

## 2012-04-02 MED ORDER — HYDRALAZINE HCL 25 MG PO TABS: 25.0000 mg | ORAL_TABLET | Freq: Three times a day (TID) | ORAL | Status: DC

## 2012-04-02 MED ORDER — ISOSORBIDE DINITRATE 20 MG PO TABS: 20.0000 mg | ORAL_TABLET | Freq: Three times a day (TID) | ORAL | Status: DC

## 2012-04-02 NOTE — Progress Notes (Signed)
Patient ID: [name], female   DOB: Feb 27, 1961, 51 y.o.   MRN: [id number] PCP: [facility]   51 yo with systolic CHF probably secondary to severe MR now s/p MV repair, rheumatoid arthritis, schizophrenia, and PUD presents for followup.  Last echo in 6/13 showed EF 30-35% with no MR and no significant mitral stenosis.  Patient was seen in CHF clinic in 2/13 and admitted because of volume overload.  She was diuresed and discharged on torsemide 80 qam, 40 qpm.  She was re-admitted in 3/13 after overdosing on Seroquel with hypotension.  She was re-admitted again in 5/13 after she quit taking her cardiac meds and became short of breath.  She was restarted on her meds and diuresed.  She had a St Jude ICD placed in 8/13.   At baseline, she is inactive due to knee pain bilaterally.  She is taking all her medications now.  Her weight is up 21 lbs since I last saw her. However, it appears that her weight is down about 4 lbs since the last time she was seen in the CHF clinic (she was seen there twice since last appointment with me).  As mentioned above, she remains quite inactive.  She has cut out soft drinks and iced tea.  No fried foods.  She is not particularly short of breath with exertion but does not do much more than walk around her house.  It was recommended that she try to do water aerobics at the [facility] but she has no way to get there.   Corevue on her St Jude ICD was checked today.  There has been a general upward thoracic impedance trend recently.    Labs (12/23/09) K 4.4, creatinine 1.1, BNP 145  Labs (9/11): K 3.9, creatinine 1.0, BNP 84, LFTs normal, HCT 29.3  Labs (10/11): K 4.0, creatinine 1.55 => 1.2, uric acid 13, TSH normal, BNP 75 => 172  Labs (11/11): BNP 110 => 105, creatinine 3.6 => 1.2 => 1.5, K 3.7  Labs (1/12): K 3.5, creatinine 1.2, BNP 70  Labs (2/12): LDL 105, HDL 39 Labs (6/12): 3.7, creatinine 1.12 Labs (11/12): K 4.8, creatinine 0.85, BNP 88 Labs (12/12): K 3.9,  creatinine 1.1, BNP 32 Labs (3/13): K 4, creatinine 0.85 Labs (5/13): K 4, creatinine 1.04 => 0.9, BNP 715 => 36, TSH normal Labs (8/13): K 4.4, creatinine 1.1 Labs (9/13): K 4.5, creatinine 1.15, BNP 76  Allergies:  1) ! * Colchicine  2) ! Morphine   Past History:  1. Congestive heart failure, most likely secondary to severe mitral regurgitation. TEE (6/10): EF 40%, diffuse hypokinesis, mild to moderately dilated LV, severe eccentric MR, small PFO. Cardiac MRI (6/10): EF 37% with global hypokinesis, moderate to severe MR, no delayed enhancement (no evidence for sarcoidosis or other infiltrative disease). TTE (10/10) after MV repair: EF 25-30%, moderately dilated LV, moderate diastolic dysfunction, mildly depressed RV function, trivial MR, MV mean gradient 5 mmHg, PASP 30 mmHg. RHC (12/10): mean RA 19, PA 46/26, mean PCWP 28, CI 2.5, SVO2 63%. TTE (2/11): EF 35-40% with diffuse hypokinesis, no significant MR or MS. TTE (7/11): EF 45%, mild global hypokinesis, s/p MV repair, no mitral regurgitation, minimal mitral stenosis, PA systolic pressure 40 mmHg, mild LV dilation.  TTE (11/12): EF 30-35%, mild LV dilation, mild LVH, s/p MV repair with no regurgitation and minimal stenosis.  TTE (3/13): EF 25-30%, diffuse hypokinesis, no significant mitral stenosis by pressure halftime with mean gradient 7 mmHg across valve, no  MR.  Echo (6/13): EF 30-35%, mildly dilated LV, mild mitral stenosis but no MR.  St Jude ICD placed in 8/13.  2. Severe mitral regurgitation (see above). Minimally invasive mitral valve repair on 12/18/08. She additionally had TV repair and PFO closure.  3. Microcytic anemia: EGD and colonoscopy in 12/11 did not reveal source of bleeding.  4. H/o gastric ulcers.  5. Morbid obesity.  6. Obstructive sleep apnea, on CPAP.  7. GERD.  8. Hypertension, controlled.  9. Rheumatoid arthritis  10. Gout.  11. Depression.  12. LHC (6/10): No angiographic CAD. Mildly dilated LV. 3+ MR. EF 40%.    13. Prior smoking, quit  14. Colitis (2/11)  15. Schizophrenia versus schizoaffective disorder 16. History of right leg DVT   Family History:  Negative for cardiac or renal disease.  No FH of Colon Cancer  Social History:  Single, 5 children. Unemployed.  Tobacco Use - Yes. Smoked < 1 ppd, quit 6/10.  Alcohol Use - no  Regular Exercise - no  Drug Use - no, hx crack-cocaine use  Daily Caffeine Use: 2 daily   Review of Systems  All systems reviewed and negative except as per HPI.  Current Outpatient Prescriptions  Medication Sig Dispense Refill  . aspirin EC 81 MG tablet Take 81 mg by mouth daily.      . carvedilol (COREG) 6.25 MG tablet Take 1.5 tablets (9.375 mg total) by mouth 2 (two) times daily with a meal.  90 tablet  6  . esomeprazole (NEXIUM) 40 MG capsule Take 40 mg by mouth daily before breakfast.       . hydrALAZINE (APRESOLINE) 25 MG tablet Take 1 tablet (25 mg total) by mouth 3 (three) times daily.  90 tablet  6  . losartan (COZAAR) 100 MG tablet Take 1 tablet (100 mg total) by mouth daily.      . potassium chloride SA (K-DUR,KLOR-CON) 20 MEQ tablet Take 2 tablets (40 mEq total) by mouth 2 (two) times daily.  60 tablet  6  . spironolactone (ALDACTONE) 25 MG tablet Take 25 mg by mouth daily.      Marland Kitchen torsemide (DEMADEX) 100 MG tablet Take 100 mg by mouth daily.      . [DISCONTINUED] hydrALAZINE (APRESOLINE) 25 MG tablet Take 1 tablet (25 mg total) by mouth 3 (three) times daily.  90 tablet  6  . isosorbide dinitrate (ISORDIL) 20 MG tablet Take 1 tablet (20 mg total) by mouth 3 (three) times daily.  90 tablet  6  . metolazone (ZAROXOLYN) 2.5 MG tablet 1 tablet one time a week on Tuesdays  30 minutes before you take torsemide  30 tablet  6  . [DISCONTINUED] QUEtiapine (SEROQUEL) 100 MG tablet Take 400 mg by mouth at bedtime.         BP 122/70  Pulse 90  Ht 5\' 6"  (1.676 m)  Wt 375 lb (170.099 kg)  BMI 60.53 kg/m2  SpO2 98% General: NAD, obese.  Neck: Thick neck, JVP  difficult but would estimate around 10 cm, no thyromegaly or thyroid nodule.  Lungs: Clear to auscultation bilaterally with normal respiratory effort. CV: Nondisplaced PMI.  Heart regular S1/S2, no S3/S4, no murmur.  1+ ankle edema bilaterally.  No carotid bruit.  Normal pedal pulses.  Abdomen: Soft, nontender, no hepatosplenomegaly, no distention.  Neurologic: Alert and oriented x 3.  Psych: Normal affect. Extremities: No clubbing or cyanosis.   Assessment/Plan  Mitral valve disorders  Patient is s/p MV repair. No MR and  only minimal MS on last echo. Chronic systolic heart failure  NYHA class III symptoms but stable. Nonischemic cardiomyopathy. She is taking her medications.  Exam for volume is difficult.  She is up 21 lbs since last appt with me but has lost 4 lbs since last CHF appointment.  I interrogated her ICD today for Corevue.  Thoracic impedance has actually been ok, trending upwards. Some of the weight gain may certainly be due to inactivity rather than fluid retention, but she almost certainly has some extra volume on board.  - I will have her start metolazone 2.5 mg once weekly to be taken 30 minutes before torsemide.  1st dose will be tomorrow.  On days she takes metolazone, she will increase KCl to 60 bid.   - Increase hydralazine to 25 tid (has been taking 12.5) and increase isordil to 20 tid.   - BMET/BNP today, BMET in 2 wks.  - Followup with me in about 2 wks.  OBESITY This is a major issue for her.  BMI is now 60.  Exercise capacity is severely limited by knee pain.  It was suggested that she try walking in the pool at the [facility] but she has no way to get there.   Needs aggressive dietary work, I will have her go back to see the nutritionist again.   [name] 04/02/2012 12:28 PM

## 2012-04-02 NOTE — Patient Instructions (Addendum)
Increase hydralazine to 25mg  three times a day. Check when you get home to be sure you have been taking 1/2 of your 25mg  tablet three times a day.   Increase isordil (isosorbide)  to 20mg  three times a day. You can take 2 of your 10mg  tablets three times a day and use your current supply.  Start metolazone 2.5mg  one  times a week on Tuesdays  30 minutes before you take torsemide.   Take  KCL (potassium) (3 of your 20 mEq tablets) 2 times a day  on Tuesdays when you take metolazone. On all the other days that you do not take metolazone continue to take 40 mEq )2 of your 20 mEq) tablets) 2 times a day .  Your physician recommends that you have  lab work today--BMET/BNP.  Your physician recommends that you return for lab work in: 10 days--BMET/BNP.  Your physician recommends that you schedule a follow-up appointment in: 2 weeks with Dr Shirlee Latch.

## 2012-04-10 ENCOUNTER — Encounter: Payer: Medicare Other | Admitting: Internal Medicine

## 2012-04-10 ENCOUNTER — Other Ambulatory Visit: Payer: Medicare Other

## 2012-04-16 ENCOUNTER — Encounter: Payer: Self-pay | Admitting: Cardiology

## 2012-04-16 ENCOUNTER — Ambulatory Visit (INDEPENDENT_AMBULATORY_CARE_PROVIDER_SITE_OTHER): Payer: Medicare Other | Admitting: Cardiology

## 2012-04-16 VITALS — BP 124/80 | HR 64 | Ht 66.0 in | Wt 377.0 lb

## 2012-04-16 DIAGNOSIS — I059 Rheumatic mitral valve disease, unspecified: Secondary | ICD-10-CM

## 2012-04-16 DIAGNOSIS — R0602 Shortness of breath: Secondary | ICD-10-CM

## 2012-04-16 DIAGNOSIS — I5022 Chronic systolic (congestive) heart failure: Secondary | ICD-10-CM

## 2012-04-16 DIAGNOSIS — E669 Obesity, unspecified: Secondary | ICD-10-CM

## 2012-04-16 MED ORDER — HYDRALAZINE HCL 50 MG PO TABS: ORAL_TABLET | ORAL | Status: DC

## 2012-04-16 MED ORDER — CARVEDILOL 12.5 MG PO TABS: 12.5000 mg | ORAL_TABLET | Freq: Two times a day (BID) | ORAL | Status: DC

## 2012-04-16 NOTE — Progress Notes (Signed)
Patient ID: [name], female   DOB: [birth year], 51 y.o.   MRN: [id number] PCP: [facility]   51 yo with systolic CHF probably secondary to severe MR now s/p MV repair, rheumatoid arthritis, schizophrenia, and PUD presents for followup.  Last echo in 6/13 showed EF 30-35% with no MR and no significant mitral stenosis.  Patient was seen in CHF clinic in 2/13 and admitted because of volume overload.  She was diuresed and discharged on torsemide 80 qam, 40 qpm.  She was re-admitted in 3/13 after overdosing on Seroquel with hypotension.  She was re-admitted again in 5/13 after she quit taking her cardiac meds and became short of breath.  She was restarted on her meds and diuresed.  She had a St Jude ICD placed in 8/13.   Symptoms are stable.  At baseline, she is inactive due to knee pain bilaterally.  She is taking all her medications now.  At last appointment, I started her on weekly metolazone.  Weight is stable.  She has cut out soft drinks and iced tea.  No fried foods.  She is not particularly short of breath with exertion but does not do much more than walk around her house.  It was recommended that she try to do water aerobics at the [facility] but she has no way to get there.  She has been set up with a new PCP at the [facility].   Labs (12/23/09) K 4.4, creatinine 1.1, BNP 145  Labs (9/11): K 3.9, creatinine 1.0, BNP 84, LFTs normal, HCT 29.3  Labs (10/11): K 4.0, creatinine 1.55 => 1.2, uric acid 13, TSH normal, BNP 75 => 172  Labs (11/11): BNP 110 => 105, creatinine 3.6 => 1.2 => 1.5, K 3.7  Labs (1/12): K 3.5, creatinine 1.2, BNP 70  Labs (2/12): LDL 105, HDL 39 Labs (6/12): 3.7, creatinine 1.12 Labs (11/12): K 4.8, creatinine 0.85, BNP 88 Labs (12/12): K 3.9, creatinine 1.1, BNP 32 Labs (3/13): K 4, creatinine 0.85 Labs (5/13): K 4, creatinine 1.04 => 0.9, BNP 715 => 36, TSH normal Labs (8/13): K 4.4, creatinine 1.1 Labs (9/13): K 4.5, creatinine 1.15, BNP  76 Labs (11/13): K 4.3, creatinine 1.1, BNP 31  Allergies:  1) ! * Colchicine  2) ! Morphine   Past History:  1. Congestive heart failure, most likely secondary to severe mitral regurgitation. TEE (6/10): EF 40%, diffuse hypokinesis, mild to moderately dilated LV, severe eccentric MR, small PFO. Cardiac MRI (6/10): EF 37% with global hypokinesis, moderate to severe MR, no delayed enhancement (no evidence for sarcoidosis or other infiltrative disease). TTE (10/10) after MV repair: EF 25-30%, moderately dilated LV, moderate diastolic dysfunction, mildly depressed RV function, trivial MR, MV mean gradient 5 mmHg, PASP 30 mmHg. RHC (12/10): mean RA 19, PA 46/26, mean PCWP 28, CI 2.5, SVO2 63%. TTE (2/11): EF 35-40% with diffuse hypokinesis, no significant MR or MS. TTE (7/11): EF 45%, mild global hypokinesis, s/p MV repair, no mitral regurgitation, minimal mitral stenosis, PA systolic pressure 40 mmHg, mild LV dilation.  TTE (11/12): EF 30-35%, mild LV dilation, mild LVH, s/p MV repair with no regurgitation and minimal stenosis.  TTE (3/13): EF 25-30%, diffuse hypokinesis, no significant mitral stenosis by pressure halftime with mean gradient 7 mmHg across valve, no MR.  Echo (6/13): EF 30-35%, mildly dilated LV, mild mitral stenosis but no MR.  St Jude ICD placed in 8/13.  2. Severe mitral regurgitation (see above). Minimally invasive mitral valve  repair on 12/18/08. She additionally had TV repair and PFO closure.  3. Microcytic anemia: EGD and colonoscopy in 12/11 did not reveal source of bleeding.  4. H/o gastric ulcers.  5. Morbid obesity.  6. Obstructive sleep apnea, on CPAP.  7. GERD.  8. Hypertension, controlled.  9. Rheumatoid arthritis  10. Gout.  11. Depression.  12. LHC (6/10): No angiographic CAD. Mildly dilated LV. 3+ MR. EF 40%.  13. Prior smoking, quit  14. Colitis (2/11)  15. Schizophrenia versus schizoaffective disorder 16. History of right leg DVT   Family History:  Negative for  cardiac or renal disease.  No FH of Colon Cancer  Social History:  Single, 5 children. Unemployed.  Tobacco Use - Yes. Smoked < 1 ppd, quit 6/10.  Alcohol Use - no  Regular Exercise - no  Drug Use - no, hx crack-cocaine use  Daily Caffeine Use: 2 daily   Review of Systems  All systems reviewed and negative except as per HPI.  Current Outpatient Prescriptions  Medication Sig Dispense Refill  . aspirin EC 81 MG tablet Take 81 mg by mouth daily.      . carvedilol (COREG) 6.25 MG tablet Take 1.5 tablets (9.375 mg total) by mouth 2 (two) times daily with a meal.  90 tablet  6  . esomeprazole (NEXIUM) 40 MG capsule Take 40 mg by mouth daily before breakfast.       . hydrALAZINE (APRESOLINE) 25 MG tablet Take 1 tablet (25 mg total) by mouth 3 (three) times daily.  90 tablet  6  . isosorbide dinitrate (ISORDIL) 20 MG tablet Take 1 tablet (20 mg total) by mouth 3 (three) times daily.  90 tablet  6  . losartan (COZAAR) 100 MG tablet Take 1 tablet (100 mg total) by mouth daily.      . metolazone (ZAROXOLYN) 2.5 MG tablet 1 tablet one time a week on Tuesdays  30 minutes before you take torsemide  30 tablet  6  . potassium chloride SA (K-DUR,KLOR-CON) 20 MEQ tablet Take 2 tablets (40 mEq total) by mouth 2 (two) times daily.  60 tablet  6  . spironolactone (ALDACTONE) 25 MG tablet Take 25 mg by mouth daily.      Marland Kitchen torsemide (DEMADEX) 100 MG tablet Take 100 mg by mouth daily.      . [DISCONTINUED] QUEtiapine (SEROQUEL) 100 MG tablet Take 400 mg by mouth at bedtime.         BP 124/80  Pulse 64  Ht 5\' 6"  (1.676 m)  Wt 377 lb (171.006 kg)  BMI 60.85 kg/m2 General: NAD, obese.  Neck: Thick neck, JVP difficult but would estimate around 8 cm, no thyromegaly or thyroid nodule.  Lungs: Clear to auscultation bilaterally with normal respiratory effort. CV: Nondisplaced PMI.  Heart regular S1/S2, no S3/S4, no murmur.  No edema.  No carotid bruit.  Normal pedal pulses.  Abdomen: Soft, nontender, no  hepatosplenomegaly, no distention.  Neurologic: Alert and oriented x 3.  Psych: Normal affect. Extremities: No clubbing or cyanosis.   Assessment/Plan  Mitral valve disorders  Patient is s/p MV repair. No MR and only minimal MS on last echo. Chronic systolic heart failure  Nonischemic cardiomyopathy.  NYHA class III symptoms but stable. She seems improved with less volume overload compared to prior appointment (though exam is difficult).  She is taking her medications.  Weight is stable.   - Continue current regimen of torsemide 100 mg daily and metolazone qwk.  - Continue current losartan  and spironolactone, recent BMET with normal K and creatinine. - Increase hydralazine to 37.5 mg tid and continue isordil at current dose.  - Increase Coreg to 12.5 mg bid.  - Followup with me in 6 wks.  OBESITY This is a major issue for her.  BMI is now 60.  Exercise capacity is severely limited by knee pain.  It was suggested that she try walking in the pool at the [facility] but she has no way to get there.   Needs aggressive dietary work, she does not want to go back to the nutritionist because she "knows what to do."  [name] 04/16/2012 3:31 PM

## 2012-04-16 NOTE — Patient Instructions (Addendum)
Increase coreg(carvedilol) to 12.5mg  two times a day, You can take 2 of your 6.25mg  tablets two times a day and use your current supply.  Increase hydralazine to 37.5mg  three times a day. This will be 1 and 1/2 of a 50mg  tablet three times a day.   Your physician recommends that you have lab work today-BMET/BNP.  Your physician recommends that you schedule a follow-up appointment in: 6 weeks with Dr Shirlee Latch.

## 2012-04-17 ENCOUNTER — Other Ambulatory Visit: Payer: Self-pay | Admitting: *Deleted

## 2012-04-17 ENCOUNTER — Other Ambulatory Visit: Payer: Medicare Other

## 2012-04-17 DIAGNOSIS — R0602 Shortness of breath: Secondary | ICD-10-CM

## 2012-04-17 DIAGNOSIS — I5022 Chronic systolic (congestive) heart failure: Secondary | ICD-10-CM

## 2012-04-18 ENCOUNTER — Other Ambulatory Visit (INDEPENDENT_AMBULATORY_CARE_PROVIDER_SITE_OTHER): Payer: Medicare Other

## 2012-04-18 DIAGNOSIS — R0602 Shortness of breath: Secondary | ICD-10-CM

## 2012-04-18 DIAGNOSIS — I5022 Chronic systolic (congestive) heart failure: Secondary | ICD-10-CM

## 2012-04-18 LAB — BASIC METABOLIC PANEL
BUN: 10 mg/dL (ref 6–23)
Chloride: 106 mEq/L (ref 96–112)
Creatinine, Ser: 0.9 mg/dL (ref 0.4–1.2)
Glucose, Bld: 111 mg/dL — ABNORMAL HIGH (ref 70–99)
Potassium: 4.5 mEq/L (ref 3.5–5.1)

## 2012-04-19 ENCOUNTER — Ambulatory Visit (INDEPENDENT_AMBULATORY_CARE_PROVIDER_SITE_OTHER): Payer: Medicare Other | Admitting: Internal Medicine

## 2012-04-19 ENCOUNTER — Encounter: Payer: Self-pay | Admitting: Internal Medicine

## 2012-04-19 VITALS — BP 137/91 | HR 84 | Temp 98.2°F | Ht 66.0 in | Wt 379.5 lb

## 2012-04-19 DIAGNOSIS — M25569 Pain in unspecified knee: Secondary | ICD-10-CM

## 2012-04-19 DIAGNOSIS — Z0000 Encounter for general adult medical examination without abnormal findings: Secondary | ICD-10-CM

## 2012-04-19 DIAGNOSIS — Z1231 Encounter for screening mammogram for malignant neoplasm of breast: Secondary | ICD-10-CM

## 2012-04-19 DIAGNOSIS — M25562 Pain in left knee: Secondary | ICD-10-CM

## 2012-04-19 DIAGNOSIS — N946 Dysmenorrhea, unspecified: Secondary | ICD-10-CM | POA: Insufficient documentation

## 2012-04-19 DIAGNOSIS — M1710 Unilateral primary osteoarthritis, unspecified knee: Secondary | ICD-10-CM

## 2012-04-19 MED ORDER — TRAMADOL HCL 50 MG PO TABS: 50.0000 mg | ORAL_TABLET | Freq: Four times a day (QID) | ORAL | Status: DC | PRN

## 2012-04-19 MED ORDER — AMITRIPTYLINE HCL 150 MG PO TABS: 150.0000 mg | ORAL_TABLET | Freq: Every evening | ORAL | Status: DC | PRN

## 2012-04-19 NOTE — Patient Instructions (Signed)
Pain medication and something for sleep has been called into the [facility] pharmacy. We have referred you to a gynecologist for the vaginal bleeding. We will schedule a mammogram for you. You have declined the flu shot today but if you change your mind please let us know. We have sent a referral to Sports Medicine for your knee pain.

## 2012-04-20 NOTE — Progress Notes (Addendum)
  Subjective:    Patient ID: [name], female    DOB: 1960-06-12, 51 y.o.   MRN: [id number]  HPI  Presents for assessment of chronic knee pain for the past "2 years".  Pt is morbidly obese and in a wheelchair on exam.  She has a h/o lateral meniscus tear of the right knee on MRI 2007 and tricompartment degenerative joint disease.  She states that she saw a surgeon "years ago" that stated he would not perform surgery secondary to her weight.  She states that she has previously done Physical therapy but no longer sees the benefit.  Review of Systems As per HPI    Objective:   Physical Exam  Constitutional: She is oriented to person, place, and time. She appears well-developed and well-nourished. No distress.       AA female, morbidly obese, in wheelchair  HENT:  Head: Normocephalic and atraumatic.  Cardiovascular: Normal rate and regular rhythm.   Murmur heard. Pulmonary/Chest: Effort normal and breath sounds normal.  Abdominal: Soft. Bowel sounds are normal.  Musculoskeletal: She exhibits tenderness. She exhibits no edema.       Right knee: She exhibits decreased range of motion. She exhibits no swelling and no effusion. tenderness found.       Left knee: She exhibits decreased range of motion. She exhibits no swelling and no effusion. tenderness found.  Neurological: She is alert and oriented to person, place, and time.  Skin: Skin is warm and dry.  Psychiatric: She has a normal mood and affect. Her behavior is normal. Judgment and thought content normal.          Assessment & Plan:  1. Knee pain: will refer to Sports Medicine for further assessment and recommendations -prescribed Tramadol prn -consider referal to Gastric Bypass surgeon as weight loss may be her only option

## 2012-04-23 ENCOUNTER — Other Ambulatory Visit: Payer: Self-pay | Admitting: *Deleted

## 2012-04-23 DIAGNOSIS — I5022 Chronic systolic (congestive) heart failure: Secondary | ICD-10-CM

## 2012-04-24 ENCOUNTER — Encounter: Payer: Self-pay | Admitting: Family Medicine

## 2012-04-27 ENCOUNTER — Ambulatory Visit: Payer: Medicare Other | Admitting: Family Medicine

## 2012-04-30 ENCOUNTER — Ambulatory Visit (HOSPITAL_COMMUNITY): Admission: RE | Admit: 2012-04-30 | Payer: Medicare Other | Source: Ambulatory Visit

## 2012-05-01 ENCOUNTER — Encounter (HOSPITAL_COMMUNITY): Payer: Self-pay | Admitting: Emergency Medicine

## 2012-05-01 ENCOUNTER — Emergency Department (HOSPITAL_COMMUNITY)
Admission: EM | Admit: 2012-05-01 | Discharge: 2012-05-01 | Disposition: A | Discharge status: Home / Self Care | Payer: Medicare Other | Attending: Emergency Medicine | Admitting: Emergency Medicine

## 2012-05-01 DIAGNOSIS — Z87891 Personal history of nicotine dependence: Secondary | ICD-10-CM | POA: Insufficient documentation

## 2012-05-01 DIAGNOSIS — Z86718 Personal history of other venous thrombosis and embolism: Secondary | ICD-10-CM | POA: Insufficient documentation

## 2012-05-01 DIAGNOSIS — Z8719 Personal history of other diseases of the digestive system: Secondary | ICD-10-CM | POA: Insufficient documentation

## 2012-05-01 DIAGNOSIS — M549 Dorsalgia, unspecified: Secondary | ICD-10-CM | POA: Insufficient documentation

## 2012-05-01 DIAGNOSIS — Z79899 Other long term (current) drug therapy: Secondary | ICD-10-CM | POA: Insufficient documentation

## 2012-05-01 DIAGNOSIS — K219 Gastro-esophageal reflux disease without esophagitis: Secondary | ICD-10-CM | POA: Insufficient documentation

## 2012-05-01 DIAGNOSIS — I10 Essential (primary) hypertension: Secondary | ICD-10-CM | POA: Insufficient documentation

## 2012-05-01 DIAGNOSIS — Z862 Personal history of diseases of the blood and blood-forming organs and certain disorders involving the immune mechanism: Secondary | ICD-10-CM | POA: Insufficient documentation

## 2012-05-01 DIAGNOSIS — Z8709 Personal history of other diseases of the respiratory system: Secondary | ICD-10-CM | POA: Insufficient documentation

## 2012-05-01 DIAGNOSIS — Z8639 Personal history of other endocrine, nutritional and metabolic disease: Secondary | ICD-10-CM | POA: Insufficient documentation

## 2012-05-01 DIAGNOSIS — Z8739 Personal history of other diseases of the musculoskeletal system and connective tissue: Secondary | ICD-10-CM | POA: Insufficient documentation

## 2012-05-01 DIAGNOSIS — I509 Heart failure, unspecified: Secondary | ICD-10-CM | POA: Insufficient documentation

## 2012-05-01 DIAGNOSIS — Z96659 Presence of unspecified artificial knee joint: Secondary | ICD-10-CM | POA: Insufficient documentation

## 2012-05-01 DIAGNOSIS — M25569 Pain in unspecified knee: Secondary | ICD-10-CM | POA: Insufficient documentation

## 2012-05-01 DIAGNOSIS — J4489 Other specified chronic obstructive pulmonary disease: Secondary | ICD-10-CM | POA: Insufficient documentation

## 2012-05-01 DIAGNOSIS — F209 Schizophrenia, unspecified: Secondary | ICD-10-CM | POA: Insufficient documentation

## 2012-05-01 DIAGNOSIS — G8929 Other chronic pain: Secondary | ICD-10-CM | POA: Insufficient documentation

## 2012-05-01 DIAGNOSIS — Z8659 Personal history of other mental and behavioral disorders: Secondary | ICD-10-CM | POA: Insufficient documentation

## 2012-05-01 DIAGNOSIS — Z7982 Long term (current) use of aspirin: Secondary | ICD-10-CM | POA: Insufficient documentation

## 2012-05-01 DIAGNOSIS — Z8679 Personal history of other diseases of the circulatory system: Secondary | ICD-10-CM | POA: Insufficient documentation

## 2012-05-01 DIAGNOSIS — J449 Chronic obstructive pulmonary disease, unspecified: Secondary | ICD-10-CM | POA: Insufficient documentation

## 2012-05-01 DIAGNOSIS — Z8669 Personal history of other diseases of the nervous system and sense organs: Secondary | ICD-10-CM | POA: Insufficient documentation

## 2012-05-01 DIAGNOSIS — Z87440 Personal history of urinary (tract) infections: Secondary | ICD-10-CM | POA: Insufficient documentation

## 2012-05-01 MED ORDER — OXYCODONE-ACETAMINOPHEN 5-325 MG PO TABS: 1.0000 | ORAL_TABLET | Freq: Four times a day (QID) | ORAL | Status: DC | PRN

## 2012-05-01 MED ORDER — PREDNISONE 10 MG PO TABS: 20.0000 mg | ORAL_TABLET | Freq: Two times a day (BID) | ORAL | Status: DC

## 2012-05-01 NOTE — ED Notes (Signed)
Pt presents c/o left knee pain. Pt states she has chronic knee pain but it has gradually gotten worse over the past week. Pt having progressive difficulty ambulating.

## 2012-05-01 NOTE — ED Provider Notes (Signed)
History    This chart was scribed for Geoffery Lyons, MD, MD by Smitty Pluck, ED Scribe. The patient was seen in room TR08C and the patient's care was started at 12:05PM.   CSN: [id number]  Arrival date & time 05/01/12  1031      Chief Complaint  Patient presents with  . Knee Pain    (Consider location/radiation/quality/duration/timing/severity/associated sxs/prior treatment) The history is provided by the patient. No language interpreter was used.   [name] is a 51 y.o. female with hx of chronic knee pain and arthritis who presents to the Emergency Department complaining of constant, severe left knee pain that has been ongoing with symptoms worsening within past week. Symptoms have gradually worsened. Pt reports that symptoms have never been this bad. Pt reports pain is aggravated by walking and bearing weight. She states that she has pain in right knee but left knee is worse. She has seen orthopedist in past and he told her that he did not recommend surgery due to her weight. Pt denies fall, recent trauma to knees, back pain, numbness, weakness and any other pain.   Past Medical History  Diagnosis Date  . Severe mitral regurgitation     Minimally invasive vale repair on 12/18/08. Additionally TV repair  and PFO closure.  . CHF (congestive heart failure)     TEE(6/10): EF 40%  . Microcytic anemia     EGD and colonoscopy in 12/11 did not reveal source of bleeding.Plan capsule endoscopy.  . Gastric ulcer   . Morbid obesity   . Obstructive sleep apnea     on CPAP  . GERD (gastroesophageal reflux disease)   . HTN (hypertension)   . Gout   . Depression   . Colitis 2/11  . SOB (shortness of breath)   . Chronic back pain   . Chronic knee pain   . DVT (deep venous thrombosis)     right leg  . Anxiety   . Rheumatoid arthritis     "shoulders & knees"  . Sleep disturbance     07/14/11 "haven't slept in 10 months; since going to [facility]"  . Hx: UTI (urinary tract infection)    . Nocturia   . COPD (chronic obstructive pulmonary disease)   . Bronchitis   . PTSD (post-traumatic stress disorder)   . Acute-on-chronic respiratory failure   . Schizophrenia     Past Surgical History  Procedure Date  . Cardiac valve replacement 12/2008    cardiac  . Cardiac catheterization 04/20/2009, 11/04/2008  . Right knee arthroscopy 06/11/2004  . Extraction of teeth 12/04/2008  . Right mini thoracotomy for mitral valve repair and closure of foreman ovale 12/18/2008    Family History  Problem Relation Age of Onset  . Hypertension Sister     History  Substance Use Topics  . Smoking status: Former Smoker -- 0.5 packs/day for 27 years    Types: Cigarettes    Quit date: 12/18/2008  . Smokeless tobacco: Never Used  . Alcohol Use: No     Comment: "stopped drinking alcohol 12/2003; did drink ~ 6 shots/wk"    OB History    Grav Para Term Preterm Abortions TAB SAB Ect Mult Living                  Review of Systems  Constitutional: Negative for fever and chills.  Respiratory: Negative for shortness of breath.   Gastrointestinal: Negative for nausea and vomiting.  Musculoskeletal: Positive for arthralgias.  Neurological: Negative for  weakness and numbness.  All other systems reviewed and are negative.    Allergies  Colchicine; Morphine; Risperidone; and Shrimp  Home Medications   Current Outpatient Rx  Name  Route  Sig  Dispense  Refill  . AMITRIPTYLINE HCL 150 MG PO TABS   Oral   Take 150 mg by mouth at bedtime as needed. For sleep         . ASPIRIN EC 81 MG PO TBEC   Oral   Take 81 mg by mouth daily.         Marland Kitchen CARVEDILOL 12.5 MG PO TABS   Oral   Take 12.5 mg by mouth 2 (two) times daily.         Marland Kitchen ESOMEPRAZOLE MAGNESIUM 40 MG PO CPDR   Oral   Take 40 mg by mouth daily before breakfast.          . HYDRALAZINE HCL 50 MG PO TABS   Oral   Take 37.5 mg by mouth 3 (three) times daily.         . ISOSORBIDE DINITRATE 20 MG PO TABS   Oral   Take  20 mg by mouth 3 (three) times daily.         Marland Kitchen LOSARTAN POTASSIUM 100 MG PO TABS   Oral   Take 1 tablet (100 mg total) by mouth daily.         Marland Kitchen METOLAZONE 2.5 MG PO TABS   Oral   Take 2.5 mg by mouth once a week. Take on Tuesdays 30 minutes before you take torsemide.         Marland Kitchen POTASSIUM CHLORIDE CRYS ER 20 MEQ PO TBCR   Oral   Take 40 mEq by mouth 2 (two) times daily.         Marland Kitchen SPIRONOLACTONE 25 MG PO TABS   Oral   Take 25 mg by mouth daily.         . TORSEMIDE 100 MG PO TABS   Oral   Take 100 mg by mouth daily.         . TRAMADOL HCL 50 MG PO TABS   Oral   Take 50-100 mg by mouth every 6 (six) hours as needed. For pain           BP 108/73  Pulse 85  Temp 97.9 F (36.6 C) (Oral)  Resp 20  SpO2 99%  LMP 04/17/2012  Physical Exam  Nursing note and vitals reviewed. Constitutional: She is oriented to person, place, and time. She appears well-developed and well-nourished. No distress.       Obese   HENT:  Head: Normocephalic and atraumatic.  Eyes: EOM are normal.  Neck: Neck supple. No tracheal deviation present.  Cardiovascular: Normal rate.   Pulmonary/Chest: Effort normal. No respiratory distress.  Musculoskeletal:       Tenderness of bilateral knees  no palpable effusion Pain with ROM which limits exam   Neurological: She is alert and oriented to person, place, and time.  Skin: Skin is warm and dry.  Psychiatric: She has a normal mood and affect. Her behavior is normal.    ED Course  Procedures (including critical care time) DIAGNOSTIC STUDIES: Oxygen Saturation is 99% on room air, normal by my interpretation.    COORDINATION OF CARE: 12:08 PM Discussed ED treatment with pt     Labs Reviewed - No data to display No results found.   No diagnosis found.    MDM  Patient is obese with  chronic knee pain.  She has seen ortho in the past who has been reluctant to operate until she loses weight.  She did not go back.  She will be  treated with prednisone and percocet      I personally performed the services described in this documentation, which was scribed in my presence. The recorded information has been reviewed and is accurate.      Geoffery Lyons, MD 05/01/12 [id number]

## 2012-05-02 ENCOUNTER — Encounter (HOSPITAL_COMMUNITY): Payer: Medicare Other

## 2012-05-03 ENCOUNTER — Other Ambulatory Visit: Payer: Medicare Other

## 2012-05-10 ENCOUNTER — Other Ambulatory Visit (HOSPITAL_COMMUNITY)
Admission: RE | Admit: 2012-05-10 | Discharge: 2012-05-10 | Disposition: A | Discharge status: Home / Self Care | Payer: Medicare Other | Source: Ambulatory Visit | Attending: Family Medicine | Admitting: Family Medicine

## 2012-05-10 ENCOUNTER — Encounter: Payer: Self-pay | Admitting: Family Medicine

## 2012-05-10 ENCOUNTER — Ambulatory Visit (INDEPENDENT_AMBULATORY_CARE_PROVIDER_SITE_OTHER): Payer: Medicare Other | Admitting: Family Medicine

## 2012-05-10 VITALS — BP 115/71 | HR 89 | Temp 97.4°F | Ht 66.0 in | Wt 374.9 lb

## 2012-05-10 DIAGNOSIS — R87810 Cervical high risk human papillomavirus (HPV) DNA test positive: Secondary | ICD-10-CM | POA: Insufficient documentation

## 2012-05-10 DIAGNOSIS — Z01812 Encounter for preprocedural laboratory examination: Secondary | ICD-10-CM

## 2012-05-10 DIAGNOSIS — N926 Irregular menstruation, unspecified: Secondary | ICD-10-CM

## 2012-05-10 DIAGNOSIS — N949 Unspecified condition associated with female genital organs and menstrual cycle: Secondary | ICD-10-CM | POA: Insufficient documentation

## 2012-05-10 DIAGNOSIS — N938 Other specified abnormal uterine and vaginal bleeding: Secondary | ICD-10-CM | POA: Insufficient documentation

## 2012-05-10 DIAGNOSIS — Z1151 Encounter for screening for human papillomavirus (HPV): Secondary | ICD-10-CM | POA: Insufficient documentation

## 2012-05-10 DIAGNOSIS — Z124 Encounter for screening for malignant neoplasm of cervix: Secondary | ICD-10-CM | POA: Insufficient documentation

## 2012-05-10 NOTE — Progress Notes (Signed)
States referred here for painful periods. States she thought she was done with periods because had stopped for one year, and then now has 4 periods , one a month. States they cause a lot of painful cramps .

## 2012-05-10 NOTE — Progress Notes (Signed)
Subjective:    Patient ID: [name], female    DOB: 11/25/1960, 51 y.o.   MRN: [id number]  HPI 51 y.o. [id number] with one year amenorrhea, then started bleeding again about 4 months ago. She has had one period a month, about 5 days long with a lot of cramping. LMP ended 12/6. Referred by PCP.  Previously, periods were regular (monthly), 5-6 days without cramping.   OB/GYN history: Menarche:  24, regular most of life, 5-6 days.  OB:  G5P5, all vaginal deliveries, last 1980. No contraception.  Single, no current partner, not sexually active, last partner 4 years. Pap smears:  Last 3 or 4 years ago at [facility].   Past Medical History  Diagnosis Date  . Severe mitral regurgitation     Minimally invasive vale repair on 12/18/08. Additionally TV repair  and PFO closure.  . CHF (congestive heart failure)     TEE(6/10): EF 40%  . Microcytic anemia     EGD and colonoscopy in 12/11 did not reveal source of bleeding.Plan capsule endoscopy.  . Gastric ulcer   . Morbid obesity   . Obstructive sleep apnea     on CPAP  . GERD (gastroesophageal reflux disease)   . HTN (hypertension)   . Gout   . Depression   . Colitis 2/11  . SOB (shortness of breath)   . Chronic back pain   . Chronic knee pain   . DVT (deep venous thrombosis)     right leg  . Anxiety   . Rheumatoid arthritis     "shoulders & knees"  . Sleep disturbance     07/14/11 "haven't slept in 10 months; since going to [facility]"  . Hx: UTI (urinary tract infection)   . Nocturia   . COPD (chronic obstructive pulmonary disease)   . Bronchitis   . PTSD (post-traumatic stress disorder)   . Acute-on-chronic respiratory failure   . Schizophrenia   . Dysmenorrhea    Past Surgical History  Procedure Date  . Cardiac valve replacement 12/2008    cardiac  . Cardiac catheterization 04/20/2009, 11/04/2008  . Right knee arthroscopy 06/11/2004  . Extraction of teeth 12/04/2008  . Right mini thoracotomy for mitral valve repair and  closure of foreman ovale 12/18/2008  ACD placement 2 months ago  Family History  Problem Relation Age of Onset  . Hypertension Sister   Aunt with unknown cancer.  History   Social History  . Marital Status: Single    Spouse Name: N/A    Number of Children: N/A  . Years of Education: N/A   Occupational History  . Not on file.   Social History Main Topics  . Smoking status: Former Smoker -- 0.5 packs/day for 27 years    Types: Cigarettes    Quit date: 12/18/2008  . Smokeless tobacco: Never Used  . Alcohol Use: No     Comment: "stopped drinking alcohol 12/2003; did drink ~ 6 shots/wk"  . Drug Use: No     Comment: "last cocaine use 2009"  . Sexually Active: Not Currently   Other Topics Concern  . Not on file   Social History Narrative  . No narrative on file   Current Outpatient Prescriptions on File Prior to Visit  Medication Sig Dispense Refill  . amitriptyline (ELAVIL) 150 MG tablet Take 150 mg by mouth at bedtime as needed. For sleep      . aspirin EC 81 MG tablet Take 81 mg by mouth daily.      Marland Kitchen  carvedilol (COREG) 12.5 MG tablet Take 12.5 mg by mouth 2 (two) times daily.      Marland Kitchen esomeprazole (NEXIUM) 40 MG capsule Take 40 mg by mouth daily before breakfast.       . hydrALAZINE (APRESOLINE) 50 MG tablet Take 37.5 mg by mouth 3 (three) times daily.      . isosorbide dinitrate (ISORDIL) 20 MG tablet Take 20 mg by mouth 3 (three) times daily.      Marland Kitchen losartan (COZAAR) 100 MG tablet Take 1 tablet (100 mg total) by mouth daily.      . metolazone (ZAROXOLYN) 2.5 MG tablet Take 2.5 mg by mouth once a week. Take on Tuesdays 30 minutes before you take torsemide.      Marland Kitchen oxyCODONE-acetaminophen (PERCOCET/ROXICET) 5-325 MG per tablet Take 1-2 tablets by mouth every 6 (six) hours as needed for pain.  20 tablet  0  . potassium chloride SA (K-DUR,KLOR-CON) 20 MEQ tablet Take 40 mEq by mouth 2 (two) times daily.      . predniSONE (DELTASONE) 10 MG tablet Take 2 tablets (20 mg total) by  mouth 2 (two) times daily.  20 tablet  0  . spironolactone (ALDACTONE) 25 MG tablet Take 25 mg by mouth daily.      Marland Kitchen torsemide (DEMADEX) 100 MG tablet Take 100 mg by mouth daily.      . traMADol (ULTRAM) 50 MG tablet Take 50-100 mg by mouth every 6 (six) hours as needed. For pain      . [DISCONTINUED] QUEtiapine (SEROQUEL) 100 MG tablet Take 400 mg by mouth at bedtime.         Allergies  Allergen Reactions  . Colchicine Itching and Other (See Comments)    Whelps.  . Morphine Itching  . Risperidone Other (See Comments)    Auditory hallucinations.  . Shrimp (Shellfish Allergy) Hives    Break out in whelps and itch.    Review of Systems  Constitutional: Positive for unexpected weight change (gaining). Negative for fever, chills and appetite change.  Respiratory: Positive for shortness of breath.   Gastrointestinal: Negative for nausea, vomiting, diarrhea and constipation.  Genitourinary: Positive for vaginal bleeding, menstrual problem and pelvic pain (cramping with bleeding). Negative for dysuria, frequency, vaginal discharge and difficulty urinating.  Neurological: Negative for headaches.       Objective:   Physical Exam  Constitutional: She is oriented to person, place, and time. No distress.       Morbidly obese  HENT:  Head: Normocephalic and atraumatic.  Eyes: Conjunctivae normal and EOM are normal.  Neck: Normal range of motion. Neck supple.  Cardiovascular: Normal rate and regular rhythm.   Pulmonary/Chest: Effort normal and breath sounds normal. No respiratory distress.  Abdominal: Bowel sounds are normal. There is no tenderness. There is no rebound and no guarding.       Obese  Genitourinary: No vaginal discharge found.       Normal external genitalia. Normal vagina, normal parous cervix. No CMT. Bimanual exam limited by body habitus.   Neurological: She is alert and oriented to person, place, and time.  Skin: Skin is warm and dry.  Psychiatric: She has a normal  mood and affect.   Filed Vitals:   05/10/12 1433  BP: 115/71  Pulse: 89  Temp: 97.4 F (36.3 C)    ENDOMETRIAL BIOPSY Patient given informed consent, signed copy in the chart. Urine pregnancy test negative. Appropriate time out taken. The patient was placed in the lithotomy position and the  cervix brought into view with sterile speculum.  Portio of cervix cleansed x 2 with betadine swabs.  A tenaculum was placed in the anterior lip of the cervix.  The uterus was sounded for depth of 7.5 cm. A pipelle was introduced to into the uterus, suction created,  and an endometrial sample was obtained. All equipment was removed and accounted for.  The patient tolerated the procedure well. Patient given post procedure instructions. The patient will return in 3 weeks for results.    Assessment & Plan:  51 y.o. female with irregular vaginal bleeding: apparently regular cycles after one year amenorrhea. - Likely perimenopausal but EMB done today. - Pap smear done today  - Dysmenorrhea - new.  Pelvic sono ordered. - Return in 3 weeks for results.  Napoleon Form, MD

## 2012-05-10 NOTE — Patient Instructions (Addendum)
Perimenopause Perimenopause is the time when your body begins to move into the menopause (no menstrual period for 12 straight months). It is a natural process. Perimenopause can begin 2 to 8 years before the menopause and usually lasts for one year after the menopause. During this time, your ovaries may or may not produce an egg. The ovaries vary in their production of estrogen and progesterone hormones each month. This can cause irregular menstrual periods, difficulty in getting pregnant, vaginal bleeding between periods and uncomfortable symptoms. CAUSES  Irregular production of the ovarian hormones, estrogen and progesterone, and not ovulating every month.  Other causes include:  Tumor of the pituitary gland in the brain.  Medical disease that affects the ovaries.  Radiation treatment.  Chemotherapy.  Unknown causes.  Heavy smoking and excessive alcohol intake can bring on perimenopause sooner. SYMPTOMS   Hot flashes.  Night sweats.  Irregular menstrual periods.  Decrease sex drive.  Vaginal dryness.  Headaches.  Mood swings.  Depression.  Memory problems.  Irritability.  Tiredness.  Weight gain.  Trouble getting pregnant.  The beginning of losing bone cells (osteoporosis).  The beginning of hardening of the arteries (atherosclerosis). DIAGNOSIS  Your caregiver will make a diagnosis by analyzing your age, menstrual history and your symptoms. They will do a physical exam noting any changes in your body, especially your female organs. Female hormone tests may or may not be helpful depending on the amount and when you produce the female hormones. However, other hormone tests may be helpful (ex. thyroid hormone) to rule out other problems. TREATMENT  The decision to treat during the perimenopause should be made by you and your caregiver depending on how the symptoms are affecting you and your life style. There are various treatments available such as:  Treating  individual symptoms with a specific medication for that symptom (ex. tranquilizer for depression).  Herbal medications that can help specific symptoms.  Counseling.  Group therapy.  No treatment. HOME CARE INSTRUCTIONS   Before seeing your caregiver, make a list of your menstrual periods (when the occur, how heavy they are, how long between periods and how long they last), your symptoms and when they started.  Take the medication as recommended by your caregiver.  Sleep and rest.  Exercise.  Eat a diet that contains calcium (good for your bones) and soy (acts like estrogen hormone).  Do not smoke.  Avoid alcoholic beverages.  Taking vitamin E may help in certain cases.  Take calcium and vitamin D supplements to help prevent bone loss.  Group therapy is sometimes helpful.  Acupuncture may help in some cases. SEEK MEDICAL CARE IF:   You have any of the above and want to know if it is perimenopause.  You want advice and treatment for any of your symptoms mentioned above.  You need a referral to a specialist (gynecologist, psychiatrist or psychologist). SEEK IMMEDIATE MEDICAL CARE IF:   You have vaginal bleeding.  Your period lasts longer than 8 days.  You periods are recurring sooner than 21 days.  You have bleeding after intercourse.  You have severe depression.  You have pain when you urinate.  You have severe headaches.  You develop vision problems. Document Released: 06/09/2004 Document Revised: 07/25/2011 Document Reviewed: 02/28/2008 [facility] Patient Information 2013 [place], [place].   Dysfunctional Uterine Bleeding Normally, menstrual periods begin between ages 72 to 78 in young women. A normal menstrual cycle/period may begin every 23 days up to 35 days and lasts from 1 to 7  days. Around 12 to 14 days before your menstrual period starts, ovulation (ovary produces an egg) occurs. When counting the time between menstrual periods, count from the first  day of bleeding of the previous period to the first day of bleeding of the next period. Dysfunctional (abnormal) uterine bleeding is bleeding that is different from a normal menstrual period. Your periods may come earlier or later than usual. They may be lighter, have blood clots or be heavier. You may have bleeding between periods, or you may skip one period or more. You may have bleeding after sexual intercourse, bleeding after menopause, or no menstrual period. CAUSES   Pregnancy (normal, miscarriage, tubal).  IUDs (intrauterine device, birth control).  Birth control pills.  Hormone treatment.  Menopause.  Infection of the cervix.  Blood clotting problems.  Infection of the inside lining of the uterus.  Endometriosis, inside lining of the uterus growing in the pelvis and other female organs.  Adhesions (scar tissue) inside the uterus.  Obesity or severe weight loss.  Uterine polyps inside the uterus.  Cancer of the vagina, cervix, or uterus.  Ovarian cysts or polycystic ovary syndrome.  Medical problems (diabetes, thyroid disease).  Uterine fibroids (noncancerous tumor).  Problems with your female hormones.  Endometrial hyperplasia, very thick lining and enlarged cells inside of the uterus.  Medicines that interfere with ovulation.  Radiation to the pelvis or abdomen.  Chemotherapy. DIAGNOSIS   Your doctor will discuss the history of your menstrual periods, medicines you are taking, changes in your weight, stress in your life, and any medical problems you may have.  Your doctor will do a physical and pelvic examination.  Your doctor may want to perform certain tests to make a diagnosis, such as:  Pap test.  Blood tests.  Cultures for infection.  CT scan.  Ultrasound.  Hysteroscopy.  Laparoscopy.  MRI.  Hysterosalpingography.  D and C.  Endometrial biopsy. TREATMENT  Treatment will depend on the cause of the dysfunctional uterine bleeding  (DUB). Treatment may include:  Observing your menstrual periods for a couple of months.  Prescribing medicines for medical problems, including:  Antibiotics.  Hormones.  Birth control pills.  Removing an IUD (intrauterine device, birth control).  Surgery:  D and C (scrape and remove tissue from inside the uterus).  Laparoscopy (examine inside the abdomen with a lighted tube).  Uterine ablation (destroy lining of the uterus with electrical current, laser, heat, or freezing).  Hysteroscopy (examine cervix and uterus with a lighted tube).  Hysterectomy (remove the uterus). HOME CARE INSTRUCTIONS   If medicines were prescribed, take exactly as directed. Do not change or switch medicines without consulting your caregiver.  Long term heavy bleeding may result in iron deficiency. Your caregiver may have prescribed iron pills. They help replace the iron that your body lost from heavy bleeding. Take exactly as directed.  Do not take aspirin or medicines that contain aspirin one week before or during your menstrual period. Aspirin may make the bleeding worse.  If you need to change your sanitary pad or tampon more than once every 2 hours, stay in bed with your feet elevated and a cold pack on your lower abdomen. Rest as much as possible, until the bleeding stops or slows down.  Eat well-balanced meals. Eat foods high in iron. Examples are:  Leafy green vegetables.  Whole-grain breads and cereals.  Eggs.  Meat.  Liver.  Do not try to lose weight until the abnormal bleeding has stopped and your blood iron  level is back to normal. Do not lift more than ten pounds or do strenuous activities when you are bleeding.  For a couple of months, make note on your calendar, marking the start and ending of your period, and the type of bleeding (light, medium, heavy, spotting, clots or missed periods). This is for your caregiver to better evaluate your problem. SEEK MEDICAL CARE IF:   You  develop nausea (feeling sick to your stomach) and vomiting, dizziness, or diarrhea while you are taking your medicine.  You are getting lightheaded or weak.  You have any problems that may be related to the medicine you are taking.  You develop pain with your DUB.  You want to remove your IUD.  You want to stop or change your birth control pills or hormones.  You have any type of abnormal bleeding mentioned above.  You are over 13 years old and have not had a menstrual period yet.  You are 51 years old and you are still having menstrual periods.  You have any of the symptoms mentioned above.  You develop a rash. SEEK IMMEDIATE MEDICAL CARE IF:   An oral temperature above 102 F (38.9 C) develops.  You develop chills.  You are changing your sanitary pad or tampon more than once an hour.  You develop abdominal pain.  You pass out or faint. Document Released: 04/29/2000 Document Revised: 07/25/2011 Document Reviewed: 03/31/2009 [facility] Patient Information 2013 [place], [place].

## 2012-05-11 ENCOUNTER — Telehealth: Payer: Self-pay | Admitting: *Deleted

## 2012-05-11 NOTE — Telephone Encounter (Signed)
Called pt and informed her of pelvic US appt on 05/22/12 @ 1500.  She was instructed to have a full bladder and arrive 10-15 min. Early.  Pt voiced understanding.

## 2012-05-14 ENCOUNTER — Encounter (HOSPITAL_COMMUNITY): Payer: Self-pay

## 2012-05-14 ENCOUNTER — Ambulatory Visit (HOSPITAL_COMMUNITY)
Admission: RE | Admit: 2012-05-14 | Discharge: 2012-05-14 | Disposition: A | Discharge status: Home / Self Care | Payer: Medicare Other | Source: Ambulatory Visit | Attending: Internal Medicine | Admitting: Internal Medicine

## 2012-05-14 VITALS — BP 136/76 | HR 87 | Wt 374.4 lb

## 2012-05-14 DIAGNOSIS — I5022 Chronic systolic (congestive) heart failure: Secondary | ICD-10-CM | POA: Insufficient documentation

## 2012-05-14 LAB — BASIC METABOLIC PANEL
BUN: 19 mg/dL (ref 6–23)
CO2: 28 mEq/L (ref 19–32)
Chloride: 100 mEq/L (ref 96–112)
Creatinine, Ser: 1.3 mg/dL — ABNORMAL HIGH (ref 0.50–1.10)
Glucose, Bld: 109 mg/dL — ABNORMAL HIGH (ref 70–99)
Potassium: 4.7 mEq/L (ref 3.5–5.1)

## 2012-05-14 MED ORDER — HYDRALAZINE HCL 50 MG PO TABS: 50.0000 mg | ORAL_TABLET | Freq: Three times a day (TID) | ORAL | Status: DC

## 2012-05-14 NOTE — Progress Notes (Signed)
Patient ID: [name], female   DOB: 1960/10/19, 51 y.o.   MRN: [id number] PCP: [name] Cardiologist: [name]  HPI: [name] is a 51 yo with systolic CHF probably secondary to severe MR now s/p MV repair, rheumatoid arthritis, schizophrenia, and PUD.     Echo in 6/13 showed EF 30-35% with no MR and no significant mitral stenosis.    Patient was seen in HF clinic in 2/13 and admitted because of volume overload.  She was diuresed and discharged on torsemide 80 qam, 40 qpm.  She was re-admitted in 3/13 after overdosing on Seroquel with hypotension.  She was re-admitted again in 5/13 for ADHF after she quit taking her cardiac meds.  She had a St Jude ICD placed in 8/13.  She was taken off hydralazine when she was in the hospital for ICD b/c of low BP. Now back on.   She returns for follow up today with her daughter and grandkids. At last visit with Tasha Shirlee Lyons hydralazine increased to 37.5 mg tid and Coreg increased to 12.5 mg bid. She thinks she has been taking 25 mg of carvedilol bid. Overall feel pretty good. Ambulating with rolling walker. Sleeping better. Not weighing at home. (last weight 373 pounds) Tries to limit fluid to less than 2 liters per day. Does eat high salt foods. Compliant with medications. Wants to be followed by [facility].    ROS: All pertinent positives and negatives as in HPI, otherwise negative  Allergies:  1) ! * Colchicine  2) ! Morphine   Past History:  1. Congestive heart failure, most likely secondary to severe mitral regurgitation. TEE (6/10): EF 40%, diffuse hypokinesis, mild to moderately dilated LV, severe eccentric MR, small PFO. Cardiac MRI (6/10): EF 37% with global hypokinesis, moderate to severe MR, no delayed enhancement (no evidence for sarcoidosis or other infiltrative disease). TTE (10/10) after MV repair: EF 25-30%, moderately dilated LV, moderate diastolic dysfunction, mildly depressed RV function, trivial MR, MV mean gradient 5 mmHg, PASP 30  mmHg. RHC (12/10): mean RA 19, PA 46/26, mean PCWP 28, CI 2.5, SVO2 63%. TTE (2/11): EF 35-40% with diffuse hypokinesis, no significant MR or MS. TTE (7/11): EF 45%, mild global hypokinesis, s/p MV repair, no mitral regurgitation, minimal mitral stenosis, PA systolic pressure 40 mmHg, mild LV dilation.  TTE (11/12): EF 30-35%, mild LV dilation, mild LVH, s/p MV repair with no regurgitation and minimal stenosis.  TTE (3/13): EF 25-30%, diffuse hypokinesis, no significant mitral stenosis by pressure halftime with mean gradient 7 mmHg across valve, no MR.  Echo (6/13): EF 30-35%, mildly dilated LV, mild mitral stenosis but no MR.  St Jude ICD placed in 8/13.  2. Severe mitral regurgitation (see above). Minimally invasive mitral valve repair on 12/18/08. She additionally had TV repair and PFO closure.  3. Microcytic anemia: EGD and colonoscopy in 12/11 did not reveal source of bleeding.  4. H/o gastric ulcers.  5. Morbid obesity.  6. Obstructive sleep apnea, on CPAP.  7. GERD.  8. Hypertension, controlled.  9. Rheumatoid arthritis  10. Gout.  11. Depression.  12. LHC (6/10): No angiographic CAD. Mildly dilated LV. 3+ MR. EF 40%.  13. Prior smoking, quit  14. Colitis (2/11)  15. Schizophrenia versus schizoaffective disorder 16. History of right leg DVT    Current Outpatient Prescriptions  Medication Sig Dispense Refill  . amitriptyline (ELAVIL) 150 MG tablet Take 150 mg by mouth at bedtime as needed. For sleep      .  aspirin EC 81 MG tablet Take 81 mg by mouth daily.      . carvedilol (COREG) 12.5 MG tablet Take 12.5 mg by mouth 2 (two) times daily.      Marland Kitchen esomeprazole (NEXIUM) 40 MG capsule Take 40 mg by mouth daily before breakfast.       . hydrALAZINE (APRESOLINE) 50 MG tablet Take 37.5 mg by mouth 3 (three) times daily.      . isosorbide dinitrate (ISORDIL) 20 MG tablet Take 20 mg by mouth 3 (three) times daily.      Marland Kitchen losartan (COZAAR) 100 MG tablet Take 1 tablet (100 mg total) by mouth  daily.      . metolazone (ZAROXOLYN) 2.5 MG tablet Take 2.5 mg by mouth once a week. Take on Tuesdays 30 minutes before you take torsemide.      . potassium chloride SA (K-DUR,KLOR-CON) 20 MEQ tablet Take 40 mEq by mouth 2 (two) times daily.      Marland Kitchen spironolactone (ALDACTONE) 25 MG tablet Take 25 mg by mouth daily.      Marland Kitchen torsemide (DEMADEX) 100 MG tablet Take 100 mg by mouth daily.      . traMADol (ULTRAM) 50 MG tablet Take 50-100 mg by mouth every 6 (six) hours as needed. For pain      . oxyCODONE-acetaminophen (PERCOCET/ROXICET) 5-325 MG per tablet Take 1-2 tablets by mouth every 6 (six) hours as needed for pain.  20 tablet  0  . predniSONE (DELTASONE) 10 MG tablet Take 2 tablets (20 mg total) by mouth 2 (two) times daily.  20 tablet  0  . [DISCONTINUED] QUEtiapine (SEROQUEL) 100 MG tablet Take 400 mg by mouth at bedtime.          Physical Exam:  Filed Vitals:   05/14/12 1541  BP: 136/76  Pulse: 87  Weight: 374 lb 6.4 oz (169.827 kg)  SpO2: 99%    General: NAD, obese. Daughter and [name] present Neck: JVP hard to see does not appear to be significantly elevated, no thyromegaly or thyroid nodule. No carotid bruit.  Lungs: Clear to auscultation bilaterally with normal respiratory effort. CV: Nondisplaced PMI.  Heart regular S1/S2, no S3/S4, no murmur.  Abdomen: Obese Soft, nontender, no hepatosplenomegaly, no distention.  Neurologic: Alert and oriented x 3.  Psych: Normal affect. Extremities: No clubbing or cyanosis. Normal pedal pulses.   LLE 1+ RLE trace edema.

## 2012-05-14 NOTE — Assessment & Plan Note (Addendum)
NYHA II. Volume status appears stable. Continue current diuretic regimen. I have asked her to call clinic with exact dose of carvedilol. She thinks she is taking 25 mg twice a day. Increase hydralazine to 50 mg tid. Check BMET today. Follow up with Dr Shirlee Latch 05/28/12. Refer to [facility] to assist with medication complaince. Follow up in HF clinic in 3 months.

## 2012-05-14 NOTE — Patient Instructions (Addendum)
Follow up in 3 months  Take Hydralazine 50 mg tid  Do the following things EVERYDAY: 1) Weigh yourself in the morning before breakfast. Write it down and keep it in a log. 2) Take your medicines as prescribed 3) Eat low salt foods-Limit salt (sodium) to 2000 mg per day.  4) Stay as active as you can everyday 5) Limit all fluids for the day to less than 2 liters

## 2012-05-22 ENCOUNTER — Other Ambulatory Visit (HOSPITAL_COMMUNITY): Payer: Medicare Other

## 2012-05-22 ENCOUNTER — Ambulatory Visit (HOSPITAL_COMMUNITY): Admission: RE | Admit: 2012-05-22 | Payer: Medicare Other | Source: Ambulatory Visit

## 2012-05-23 ENCOUNTER — Encounter: Payer: Self-pay | Admitting: Family Medicine

## 2012-05-23 ENCOUNTER — Ambulatory Visit (INDEPENDENT_AMBULATORY_CARE_PROVIDER_SITE_OTHER): Payer: Medicare Other | Admitting: Family Medicine

## 2012-05-23 VITALS — BP 134/84 | HR 109 | Ht 66.0 in | Wt 374.0 lb

## 2012-05-23 DIAGNOSIS — M25561 Pain in right knee: Secondary | ICD-10-CM

## 2012-05-23 DIAGNOSIS — M25569 Pain in unspecified knee: Secondary | ICD-10-CM

## 2012-05-23 NOTE — Patient Instructions (Addendum)
Thank you for coming in today. Call or go to the ER if you develop a large red swollen joint with extreme pain or oozing puss.  Your pain is because of wear and tear arthritis.  It is made worse by your weight.  Losing weight will help.  Work hard with your medical doctor to lose weight.  Come back in 4 weeks for a recheck.

## 2012-05-23 NOTE — Progress Notes (Signed)
[name] is a 52 y.o. female who presents to [facility] today for bilateral knee pain left worse than right. This knee pain has been present for about 8 years however it worsened 2 months ago. Patient cannot recall any injury. She notes that years ago she's been told by an orthopedic surgeon that she is significant DJD in may benefit from a knee replacement surgery, however her morbid obesity prevents this.  Additionally 2007 she was found to have a bucket handle meniscus tear which was not repairable do to her body habitus. She denies any significant back pain radiating pain weakness or numbness.  She is not currently on any therapy has tried over-the-counter medications as needed for pain which does not work well. Additionally she uses a rolling walker to aid her ambulation.   PMH reviewed.  History  Substance Use Topics  . Smoking status: Former Smoker -- 0.5 packs/day for 27 years    Types: Cigarettes    Quit date: 12/18/2008  . Smokeless tobacco: Never Used  . Alcohol Use: No     Comment: "stopped drinking alcohol 12/2003; did drink ~ 6 shots/wk"   ROS as above otherwise neg   Exam:  BP 134/84  Pulse 109  Ht 5\' 6"  (1.676 m)  Wt 374 lb (169.645 kg)  BMI 60.37 kg/m2  LMP 04/16/2012 Gen: Well NAD, extreme morbid obesity MSK: Bilateral knee.  Landmarks difficult to appreciate. No significant effusion. Range of motion zero to about 100 bilaterally. Tender palpation of the bilateral medial joint line.  Negative Lachman's McMurray's valgus varus stress.  Distal sensation pulses and capillary refill are intact the bilateral lower extremities  Review of bilateral knee x-ray from February 2013 shows moderate to severe DJD  Procedure note:   Right Knee injection. Consent obtained and timeout performed. Patient seated in a comfortable position with legs hanging off the table.  The medial Peri-patellar tendon space was palpated and marked. The skin was then cleaned with  alcohol. Cold spray applied. A 25-gauge inch and a half needle was used to inject 40 mg of Depo-Medrol and 4 mL of 0.25% Marcaine. Patient tolerated procedure well no bleeding. Pain improved following injection  Left Knee injection. Consent obtained and timeout performed. Patient seated in a comfortable position with legs hanging off the table.  The medial Peri-patellar tendon space was palpated and marked. The skin was then cleaned with alcohol. Cold spray applied. A 25-gauge inch and a half needle was used to inject 40 mg of Depo-Medrol and 4 mL of 0.25% Marcaine. Patient tolerated procedure well no bleeding. Pain improved following injection

## 2012-05-23 NOTE — Assessment & Plan Note (Signed)
Bilateral knee pain secondary to DJD exacerbated by extreme morbid obesity.  Patient is not a candidate for surgical options at this time secondary to her body habitus.  Plan:  Trial of bilateral corticosteroid injection.  Emphasize weight loss Followup in 4 weeks Discussed warning signs or symptoms. Please see discharge instructions. Patient expresses understanding.

## 2012-05-28 ENCOUNTER — Ambulatory Visit: Payer: Medicare Other | Admitting: Cardiology

## 2012-05-29 ENCOUNTER — Other Ambulatory Visit: Payer: Medicare Other

## 2012-05-29 ENCOUNTER — Encounter: Payer: Medicare Other | Admitting: Internal Medicine

## 2012-05-30 ENCOUNTER — Ambulatory Visit: Payer: Medicare Other | Admitting: Family Medicine

## 2012-05-30 ENCOUNTER — Ambulatory Visit: Payer: Medicare Other | Admitting: Obstetrics & Gynecology

## 2012-05-31 ENCOUNTER — Ambulatory Visit (HOSPITAL_COMMUNITY): Payer: Medicare Other

## 2012-06-04 ENCOUNTER — Ambulatory Visit (INDEPENDENT_AMBULATORY_CARE_PROVIDER_SITE_OTHER): Payer: Medicare Other | Admitting: Sports Medicine

## 2012-06-04 ENCOUNTER — Ambulatory Visit
Admission: RE | Admit: 2012-06-04 | Discharge: 2012-06-04 | Disposition: A | Discharge status: Home / Self Care | Payer: Medicare Other | Source: Ambulatory Visit | Attending: Sports Medicine | Admitting: Sports Medicine

## 2012-06-04 VITALS — BP 156/91 | Ht 66.0 in | Wt 375.0 lb

## 2012-06-04 DIAGNOSIS — M25562 Pain in left knee: Secondary | ICD-10-CM

## 2012-06-04 DIAGNOSIS — M25561 Pain in right knee: Secondary | ICD-10-CM

## 2012-06-04 DIAGNOSIS — M25569 Pain in unspecified knee: Secondary | ICD-10-CM

## 2012-06-04 DIAGNOSIS — M1710 Unilateral primary osteoarthritis, unspecified knee: Secondary | ICD-10-CM

## 2012-06-05 NOTE — Progress Notes (Signed)
  Subjective:    Patient ID: [name], female    DOB: 1960/12/01, 52 y.o.   MRN: [id number]  HPI Patient comes in today with persistent bilateral knee pain. She last saw Dr. Denyse Amass 2 weeks ago. Each knee was injected with cortisone but this did not provide any symptom relief. Her pain is diffuse. Constant. Interferes with her activities of daily living. No recent trauma.  Medical history is unchanged since last office visit    Review of Systems     Objective:   Physical Exam Obese 51 year old female. No acute distress.  Examination of each of her knees is limited by body habitus. Range of motion 0-90 in each knee. Difficult to evaluate for any effusion. Joint is not warm to touch. Diffuse tenderness to palpation. Knee is stable to ligamentous exam grossly. Neurovascularly intact distally. Walking with a limp.  X-rays of each of her knees done in February 2013 are reviewed. She has medial joint space narrowing consistent with osteoarthritis. Nothing acute.       Assessment & Plan:  1. Bilateral knee pain secondary to osteoarthritis 2. Morbid obesity  I suspect the arthritis in her knees is more advanced than what I'm seeing on the x-rays from February of 2013. These are non-standing x-rays. Therefore, I will order updated x-rays of each knee including a 30 flexion view while standing. Treatment options in this morbidly obese patient are limited. She is not a candidate for total knee arthroplasty given her weight and multiple medical comorbidity. I may consider Visco supplementation but if her x-rays show markedly advanced DJD, her only real option maybe pain management. She may need referral to a pain management specialist but I will defer this to the discretion of her primary care physician. I will call her after reviewing her x-rays. Of note, I did offer her some tramadol as she has taken this in the past without any relief.

## 2012-06-06 ENCOUNTER — Ambulatory Visit (HOSPITAL_COMMUNITY): Payer: Medicare Other

## 2012-06-06 ENCOUNTER — Ambulatory Visit: Payer: Medicare Other | Admitting: Sports Medicine

## 2012-06-08 ENCOUNTER — Telehealth: Payer: Self-pay | Admitting: Sports Medicine

## 2012-06-08 NOTE — Telephone Encounter (Signed)
I spoke with the patient today on the phone after reviewing her x-rays. She has degenerative changes in both knees. She has bone-on-bone medial compartment osteoarthritis on the left and approaching bone-on-bone medial compartment degenerative changes on the right. Definitive treatment is a total knee arthroplasty but her morbid obesity precludes her from this currently. I do not think she would benefit from Visco supplementation. My recommendation is for her to talk with her primary care physician about the possibility of referral to a bariatric surgeon. Alternatively, she could be referred to a pain management specialist. Otherwise, her treatments are limited. Unfortunately, I do not feel like we have much more to offer her in the way of treatment. She will followup PRN.

## 2012-06-11 ENCOUNTER — Other Ambulatory Visit: Payer: Self-pay | Admitting: *Deleted

## 2012-06-11 ENCOUNTER — Ambulatory Visit (HOSPITAL_COMMUNITY)
Admission: RE | Admit: 2012-06-11 | Discharge: 2012-06-11 | Disposition: A | Discharge status: Home / Self Care | Payer: Medicare Other | Source: Ambulatory Visit | Attending: Family Medicine | Admitting: Family Medicine

## 2012-06-11 DIAGNOSIS — N926 Irregular menstruation, unspecified: Secondary | ICD-10-CM

## 2012-06-12 MED ORDER — ESOMEPRAZOLE MAGNESIUM 40 MG PO CPDR: 40.0000 mg | DELAYED_RELEASE_CAPSULE | Freq: Every day | ORAL | Status: DC

## 2012-06-13 ENCOUNTER — Ambulatory Visit: Payer: Medicare Other | Admitting: Family Medicine

## 2012-06-20 ENCOUNTER — Ambulatory Visit: Payer: Medicare Other | Admitting: Family Medicine

## 2012-06-27 ENCOUNTER — Ambulatory Visit: Payer: Medicare Other | Admitting: Family Medicine

## 2012-07-03 ENCOUNTER — Other Ambulatory Visit (INDEPENDENT_AMBULATORY_CARE_PROVIDER_SITE_OTHER): Payer: Medicare Other

## 2012-07-03 ENCOUNTER — Encounter: Payer: Self-pay | Admitting: Cardiology

## 2012-07-03 ENCOUNTER — Ambulatory Visit (INDEPENDENT_AMBULATORY_CARE_PROVIDER_SITE_OTHER): Payer: Medicare Other | Admitting: Cardiology

## 2012-07-03 VITALS — BP 122/82 | HR 99 | Ht 66.0 in | Wt 368.0 lb

## 2012-07-03 DIAGNOSIS — I5022 Chronic systolic (congestive) heart failure: Secondary | ICD-10-CM

## 2012-07-03 DIAGNOSIS — I059 Rheumatic mitral valve disease, unspecified: Secondary | ICD-10-CM

## 2012-07-03 DIAGNOSIS — R0602 Shortness of breath: Secondary | ICD-10-CM

## 2012-07-03 DIAGNOSIS — R0989 Other specified symptoms and signs involving the circulatory and respiratory systems: Secondary | ICD-10-CM

## 2012-07-03 LAB — BASIC METABOLIC PANEL
BUN: 19 mg/dL (ref 6–23)
Calcium: 9.8 mg/dL (ref 8.4–10.5)
Chloride: 106 mEq/L (ref 96–112)
Creatinine, Ser: 1.3 mg/dL — ABNORMAL HIGH (ref 0.4–1.2)

## 2012-07-03 LAB — BRAIN NATRIURETIC PEPTIDE: Pro B Natriuretic peptide (BNP): 64 pg/mL (ref 0.0–100.0)

## 2012-07-03 MED ORDER — ISOSORBIDE DINITRATE 40 MG PO TABS: 40.0000 mg | ORAL_TABLET | Freq: Three times a day (TID) | ORAL | Status: DC

## 2012-07-03 MED ORDER — HYDRALAZINE HCL 50 MG PO TABS: ORAL_TABLET | ORAL | Status: DC

## 2012-07-03 NOTE — Progress Notes (Signed)
Patient ID: [name], female   DOB: Feb 17, 1961, 52 y.o.   MRN: [id number] PCP: Dr. Bosie Clos  52 yo with systolic CHF probably secondary to severe MR now s/p MV repair, rheumatoid arthritis, schizophrenia, and PUD presents for followup.  Last echo in 6/13 showed EF 30-35% with no MR and no significant mitral stenosis.  Patient was seen in CHF clinic in 2/13 and admitted because of volume overload.  She was diuresed and discharged on torsemide 80 qam, 40 qpm.  She was re-admitted in 3/13 after overdosing on Seroquel with hypotension.  She was re-admitted again in 5/13 after she quit taking her cardiac meds and became short of breath.  She was restarted on her meds and diuresed.  She had a St Jude ICD placed in 8/13.   Symptoms are stable.  At baseline, she is inactive due to knee pain bilaterally.  She is taking all her medications now.  Weight is down 9 lbs.  She is not particularly short of breath with exertion but does not do much more than walk around her house.  It was recommended that she try to do water aerobics at the [facility] but she has no way to get there.  No lightheadedness or syncope. No PND.   Labs (8/11) K 4.4, creatinine 1.1, BNP 145  Labs (9/11): K 3.9, creatinine 1.0, BNP 84, LFTs normal, HCT 29.3  Labs (10/11): K 4.0, creatinine 1.55 => 1.2, uric acid 13, TSH normal, BNP 75 => 172  Labs (11/11): BNP 110 => 105, creatinine 3.6 => 1.2 => 1.5, K 3.7  Labs (1/12): K 3.5, creatinine 1.2, BNP 70  Labs (2/12): LDL 105, HDL 39 Labs (6/12): 3.7, creatinine 1.12 Labs (11/12): K 4.8, creatinine 0.85, BNP 88 Labs (12/12): K 3.9, creatinine 1.1, BNP 32 Labs (3/13): K 4, creatinine 0.85 Labs (5/13): K 4, creatinine 1.04 => 0.9, BNP 715 => 36, TSH normal Labs (8/13): K 4.4, creatinine 1.1 Labs (9/13): K 4.5, creatinine 1.15, BNP 76 Labs (11/13): K 4.3, creatinine 1.1, BNP 31 Labs (12/13): K 4.7, creatinine 1.3  Allergies:  1) ! * Colchicine  2) ! Morphine   Past History:  1.  Congestive heart failure, most likely secondary to severe mitral regurgitation. TEE (6/10): EF 40%, diffuse hypokinesis, mild to moderately dilated LV, severe eccentric MR, small PFO. Cardiac MRI (6/10): EF 37% with global hypokinesis, moderate to severe MR, no delayed enhancement (no evidence for sarcoidosis or other infiltrative disease). TTE (10/10) after MV repair: EF 25-30%, moderately dilated LV, moderate diastolic dysfunction, mildly depressed RV function, trivial MR, MV mean gradient 5 mmHg, PASP 30 mmHg. RHC (12/10): mean RA 19, PA 46/26, mean PCWP 28, CI 2.5, SVO2 63%. TTE (2/11): EF 35-40% with diffuse hypokinesis, no significant MR or MS. TTE (7/11): EF 45%, mild global hypokinesis, s/p MV repair, no mitral regurgitation, minimal mitral stenosis, PA systolic pressure 40 mmHg, mild LV dilation.  TTE (11/12): EF 30-35%, mild LV dilation, mild LVH, s/p MV repair with no regurgitation and minimal stenosis.  TTE (3/13): EF 25-30%, diffuse hypokinesis, no significant mitral stenosis by pressure halftime with mean gradient 7 mmHg across valve, no MR.  Echo (6/13): EF 30-35%, mildly dilated LV, mild mitral stenosis but no MR.  St Jude ICD placed in 8/13.  2. Severe mitral regurgitation (see above). Minimally invasive mitral valve repair on 12/18/08. She additionally had TV repair and PFO closure.  3. Microcytic anemia: EGD and colonoscopy in 12/11 did not reveal source of bleeding.  4. H/o gastric ulcers.  5. Morbid obesity.  6. Obstructive sleep apnea, on CPAP.  7. GERD.  8. Hypertension, controlled.  9. Rheumatoid arthritis  10. Gout.  11. Depression.  12. LHC (6/10): No angiographic CAD. Mildly dilated LV. 3+ MR. EF 40%.  13. Prior smoking, quit  14. Colitis (2/11)  15. Schizophrenia versus schizoaffective disorder 16. History of right leg DVT   Family History:  Negative for cardiac or renal disease.  No FH of Colon Cancer  Social History:  Single, 5 children. Unemployed.  Tobacco Use -  Yes. Smoked < 1 ppd, quit 6/10.  Alcohol Use - no  Regular Exercise - no  Drug Use - no, hx crack-cocaine use  Daily Caffeine Use: 2 daily   Review of Systems  All systems reviewed and negative except as per HPI.  Current Outpatient Prescriptions  Medication Sig Dispense Refill  . amitriptyline (ELAVIL) 150 MG tablet Take 150 mg by mouth at bedtime as needed. For sleep      . aspirin EC 81 MG tablet Take 81 mg by mouth daily.      Marland Kitchen esomeprazole (NEXIUM) 40 MG capsule Take 1 capsule (40 mg total) by mouth daily before breakfast.  90 capsule  1  . losartan (COZAAR) 100 MG tablet Take 1 tablet (100 mg total) by mouth daily.      . metolazone (ZAROXOLYN) 2.5 MG tablet Take 2.5 mg by mouth once a week. Take on Tuesdays 30 minutes before you take torsemide.      Marland Kitchen oxyCODONE-acetaminophen (PERCOCET/ROXICET) 5-325 MG per tablet Take 1-2 tablets by mouth every 6 (six) hours as needed for pain.  20 tablet  0  . potassium chloride SA (K-DUR,KLOR-CON) 20 MEQ tablet Take 40 mEq by mouth 2 (two) times daily.      . predniSONE (DELTASONE) 10 MG tablet Take 2 tablets (20 mg total) by mouth 2 (two) times daily.  20 tablet  0  . spironolactone (ALDACTONE) 25 MG tablet Take 25 mg by mouth daily.      Marland Kitchen torsemide (DEMADEX) 100 MG tablet Take 100 mg by mouth daily.      . traMADol (ULTRAM) 50 MG tablet Take 50-100 mg by mouth every 6 (six) hours as needed. For pain      . carvedilol (COREG) 25 MG tablet Take 1 tablet (25 mg total) by mouth 2 (two) times daily.      . hydrALAZINE (APRESOLINE) 50 MG tablet 1 and 1/2 tablets (total 75mg ) three times a day  135 tablet  6  . isosorbide dinitrate (ISORDIL) 40 MG tablet Take 1 tablet (40 mg total) by mouth 3 (three) times daily.  90 tablet  6  . [DISCONTINUED] QUEtiapine (SEROQUEL) 100 MG tablet Take 400 mg by mouth at bedtime.        No current facility-administered medications for this visit.    BP 122/82  Pulse 99  Ht 5\' 6"  (1.676 m)  Wt 368 lb (166.924 kg)   BMI 59.43 kg/m2  SpO2 99% General: NAD, obese.  Neck: Thick neck, JVP difficult but would estimate around 7-8 cm, no thyromegaly or thyroid nodule.  Lungs: Clear to auscultation bilaterally with normal respiratory effort. CV: Nondisplaced PMI.  Heart regular S1/S2, no S3/S4, no murmur.  No edema.  No carotid bruit.  Normal pedal pulses.  Abdomen: Soft, nontender, no hepatosplenomegaly, no distention.  Neurologic: Alert and oriented x 3.  Psych: Normal affect. Extremities: No clubbing or cyanosis.   Assessment/Plan  Mitral  valve disorders  Patient is s/p MV repair. No MR and only minimal MS on last echo. Chronic systolic heart failure  Nonischemic cardiomyopathy.  NYHA class III symptoms but stable. Her weight is down and she is compliant with her meds.  She seems to be doing reasonably well overall and is on good doses of cardiac meds. - Continue current regimen of torsemide 100 mg daily and metolazone qwk.  - Continue current Coreg, hydralazine, losartan and spironolactone, will get BMET and BNP today.  - Increase isordil to 40 mg tid.  OBESITY This is a major issue for her.  BMI is now 59.  Exercise capacity is severely limited by knee pain.  She has lost 9 lbs but I am not sure that she is going to be able to make much more progress given her inability to exercise.  I would agree with considering gastric bypass.   [name] 07/03/2012

## 2012-07-03 NOTE — Patient Instructions (Addendum)
Continue hydralazine to 75mg  three times a day.   Increase isosorbide to 40mg  three times a day. You can take 2 of your 20mg  tablets three times a day  and use your current supply.  Your physician recommends that you have lab work today--BMET/BNP  Your physician recommends that you schedule a follow-up appointment in: 4 months with Dr Shirlee Latch.

## 2012-07-06 ENCOUNTER — Ambulatory Visit (INDEPENDENT_AMBULATORY_CARE_PROVIDER_SITE_OTHER): Payer: Medicare Other | Admitting: Medical

## 2012-07-06 ENCOUNTER — Encounter: Payer: Self-pay | Admitting: Internal Medicine

## 2012-07-06 ENCOUNTER — Encounter: Payer: Self-pay | Admitting: Medical

## 2012-07-06 ENCOUNTER — Ambulatory Visit (INDEPENDENT_AMBULATORY_CARE_PROVIDER_SITE_OTHER): Payer: Medicaid Other | Admitting: Internal Medicine

## 2012-07-06 VITALS — BP 129/85 | HR 104 | Ht 66.0 in | Wt 350.0 lb

## 2012-07-06 VITALS — BP 129/86 | HR 103 | Temp 97.3°F | Ht 66.0 in | Wt 369.9 lb

## 2012-07-06 DIAGNOSIS — I5022 Chronic systolic (congestive) heart failure: Secondary | ICD-10-CM

## 2012-07-06 DIAGNOSIS — N951 Menopausal and female climacteric states: Secondary | ICD-10-CM

## 2012-07-06 DIAGNOSIS — I428 Other cardiomyopathies: Secondary | ICD-10-CM

## 2012-07-06 DIAGNOSIS — Z95810 Presence of automatic (implantable) cardiac defibrillator: Secondary | ICD-10-CM

## 2012-07-06 LAB — ICD DEVICE OBSERVATION
DEV-0020ICD: NEGATIVE
HV IMPEDENCE: 79 Ohm
PACEART VT: 0
RV LEAD IMPEDENCE ICD: 525 Ohm
RV LEAD THRESHOLD: 0.75 V
TOT-0009: 0
TOT-0010: 2
TZAT-0004SLOWVT: 8
TZAT-0012SLOWVT: 200 ms
TZAT-0013SLOWVT: 3
TZAT-0018SLOWVT: NEGATIVE
TZON-0003SLOWVT: 300 ms
TZST-0001SLOWVT: 3
TZST-0001SLOWVT: 5
TZST-0003SLOWVT: 875 V

## 2012-07-06 NOTE — Progress Notes (Signed)
Patient Care Team: Manuela Schwartz, MD as PCP - General (Internal Medicine)   HPI  [name] is a 52 y.o. 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 She also underwent tricuspid valve repair with a PFO closure    She saw Dr. DM yesterday and the consensus was that these things are stable  Past Medical History  Diagnosis Date  . Severe mitral regurgitation     Minimally invasive vale repair on 12/18/08. Additionally TV repair  and PFO closure.  . CHF (congestive heart failure)     TEE(6/10): EF 40%  . Microcytic anemia     EGD and colonoscopy in 12/11 did not reveal source of bleeding.Plan capsule endoscopy.  . Gastric ulcer   . Morbid obesity   . Obstructive sleep apnea     on CPAP  . GERD (gastroesophageal reflux disease)   . HTN (hypertension)   . Gout   . Depression   . Colitis 2/11  . SOB (shortness of breath)   . Chronic back pain   . Chronic knee pain   . DVT (deep venous thrombosis)     right leg  . Anxiety   . Rheumatoid arthritis     "shoulders & knees"  . Sleep disturbance     07/14/11 "haven't slept in 10 months; since going to [facility]"  . Hx: UTI (urinary tract infection)   . Nocturia   . COPD (chronic obstructive pulmonary disease)   . Bronchitis   . PTSD (post-traumatic stress disorder)   . Acute-on-chronic respiratory failure   . Schizophrenia   . Dysmenorrhea     Past Surgical History  Procedure Laterality Date  . Cardiac valve replacement  12/2008    cardiac  . Cardiac catheterization  04/20/2009, 11/04/2008  . Right knee arthroscopy  06/11/2004  . Extraction of teeth  12/04/2008  . Right mini thoracotomy for mitral valve repair and closure of foreman ovale  12/18/2008    Current Outpatient Prescriptions   Medication Sig Dispense Refill  . amitriptyline (ELAVIL) 150 MG tablet Take 150 mg by mouth at bedtime as needed. For sleep      . aspirin EC 81 MG tablet Take 81 mg by mouth daily.      . carvedilol (COREG) 25 MG tablet Take 1 tablet (25 mg total) by mouth 2 (two) times daily.      Marland Kitchen esomeprazole (NEXIUM) 40 MG capsule Take 1 capsule (40 mg total) by mouth daily before breakfast.  90 capsule  1  . hydrALAZINE (APRESOLINE) 50 MG tablet 1 and 1/2 tablets (total 75mg ) three times a day  135 tablet  6  . isosorbide dinitrate (ISORDIL) 40 MG tablet Take 1 tablet (40 mg total) by mouth 3 (three) times daily.  90 tablet  6  . losartan (COZAAR) 100 MG tablet Take 1 tablet (100 mg total) by mouth daily.      . metolazone (ZAROXOLYN) 2.5 MG tablet Take 2.5 mg by mouth once a week. Take on Tuesdays 30 minutes before you take torsemide.      Marland Kitchen oxyCODONE-acetaminophen (PERCOCET/ROXICET) 5-325 MG per tablet Take 1-2 tablets by mouth every 6 (six) hours as needed for pain.  20 tablet  0  . potassium chloride SA (K-DUR,KLOR-CON) 20 MEQ tablet Take 40 mEq by mouth 2 (two) times  daily.      . predniSONE (DELTASONE) 10 MG tablet Take 2 tablets (20 mg total) by mouth 2 (two) times daily.  20 tablet  0  . spironolactone (ALDACTONE) 25 MG tablet Take 25 mg by mouth daily.      Marland Kitchen torsemide (DEMADEX) 100 MG tablet Take 100 mg by mouth daily.      . traMADol (ULTRAM) 50 MG tablet Take 50-100 mg by mouth every 6 (six) hours as needed. For pain      . [DISCONTINUED] QUEtiapine (SEROQUEL) 100 MG tablet Take 400 mg by mouth at bedtime.        No current facility-administered medications for this visit.    Allergies  Allergen Reactions  . Colchicine Itching and Other (See Comments)    [place].  . Morphine Itching  . Risperidone Other (See Comments)    Auditory hallucinations.  . Shrimp (Shellfish Allergy) Hives    Break out in whelps and itch.    Review of Systems negative except from HPI and PMH  Physical  Exam BP 129/85  Pulse 104  Ht 5\' 6"  (1.676 m)  Wt 350 lb (158.759 kg)  BMI 56.52 kg/m2  LMP 05/05/2012 Well developed and well nourished in no acute distress HENT normal E scleral and icterus clear Neck Supple Device pocket well healed; without hematoma or erythema Keloid present Clear to ausculation Regular rate and rhythm, no murmurs gallops or rub Soft with active bowel sounds No clubbing cyanosis none Edema Alert and oriented, grossly normal motor and sensory function Skin Warm and Dry    Assessment and  Plan

## 2012-07-06 NOTE — Assessment & Plan Note (Signed)
Stable on current meds 

## 2012-07-06 NOTE — Patient Instructions (Signed)

## 2012-07-06 NOTE — Assessment & Plan Note (Signed)
The patient's device was interrogated and the information was fully reviewed.  The device was reprogrammed to decrese RV pacing outputs

## 2012-07-06 NOTE — Progress Notes (Signed)
Subjective:     Patient ID: [name], female   DOB: 04-16-1961, 52 y.o.   MRN: [id number]  HPI [name] is a 52 yo female who presents to the clinic to follow up on recent test results. She reports that after the year of amenorrhea she had regular periods that lasted 5 days monthly for 1 year, but has not had any vaginal bleeding in the last 2 months. She denies any abdominal cramping or pain. She also denies any abnormal vaginal discharge.  Review of Systems  Gastrointestinal: Negative for abdominal pain.  Genitourinary: Negative for vaginal bleeding, vaginal discharge and pelvic pain.      Objective:   Physical Exam  BP 129/86  Pulse 103  Temp(Src) 97.3 F (36.3 C) (Oral)  Ht 5\' 6"  (1.676 m)  Wt 369 lb 14.4 oz (167.786 kg)  BMI 59.73 kg/m2  LMP 05/05/2012 GENERAL: Well-developed, well-nourished female in no acute distress.  HEENT: Normocephalic, atraumatic. Sclerae anicteric.  LUNGS: Normal effort HEART: Regular rate. SKIN: Warm, dry and without rashes  Pap smear and endometrial biopsy came back with normal results; no evidence of cancer. US Impression: 1. No evidence for uterine mass.  2. Endometrium is difficult to visualize but estimated to measure  15 mm. If bleeding remains unresponsive to hormonal or medical  therapy, sonohysterogram should be considered for focal lesion work-  up. (Ref: Radiological Reasoning: Algorithmic Workup of Abnormal  Vaginal Bleeding with Endovaginal Sonography and Sonohysterography.  AJR 2008; 147:[id number]).  3. Non-visualized ovaries.    Assessment:     1. Perimenopause     Plan:     Discussed results of Pap smear, endometrial biopsy and [place] with patient. Patient reassured that testing was normal. Patient told to return if heavy vaginal bleeding would start again. Follow-up in 1 year for annual exam.  Patient will make appointment for yearly mammogram.     I have seen and evaluated this patient with the PA student.  I have edited the above note to reflect my observations I agree with the assessment and plan as written above

## 2012-07-09 ENCOUNTER — Ambulatory Visit (INDEPENDENT_AMBULATORY_CARE_PROVIDER_SITE_OTHER): Payer: Medicare Other | Admitting: Internal Medicine

## 2012-07-09 ENCOUNTER — Encounter: Payer: Self-pay | Admitting: Internal Medicine

## 2012-07-09 VITALS — BP 137/67 | HR 97 | Temp 97.1°F | Ht 66.0 in | Wt 377.0 lb

## 2012-07-09 DIAGNOSIS — M1710 Unilateral primary osteoarthritis, unspecified knee: Secondary | ICD-10-CM

## 2012-07-09 DIAGNOSIS — M25569 Pain in unspecified knee: Secondary | ICD-10-CM

## 2012-07-09 DIAGNOSIS — M25562 Pain in left knee: Secondary | ICD-10-CM

## 2012-07-09 MED ORDER — HYDROCODONE-ACETAMINOPHEN 7.5-300 MG PO TABS: 1.0000 | ORAL_TABLET | Freq: Four times a day (QID) | ORAL | Status: DC | PRN

## 2012-07-09 NOTE — Progress Notes (Signed)
  Subjective:    Patient ID: [name], female    DOB: [birth year], 52 y.o.   MRN: [id number]  HPI  Patient history significant for CHF, nonischemic/valvular disease status post ICD implantation with last interrogation of 07/06/2012, schizophrenia, morbid obesity with BMI greater than 60, severe degenerative bilateral knee joint disease with recent evaluation by sports medicine and bilateral local steroid injection January 2014. Presents for pain medication and information concerning gastric bypass. Patient states that the tramadol and airway injections have not relieved her knee pain.  Review of Systems As per history of present illness    Objective:   Physical Exam  Constitutional: She is oriented to person, place, and time. She appears well-developed and well-nourished.  [place] obese, sitting in wheelchair  HENT:  Head: Normocephalic and atraumatic.  Eyes: Conjunctivae and EOM are normal. Pupils are equal, round, and reactive to light.  Cardiovascular: Normal rate, regular rhythm and normal heart sounds.   Pulmonary/Chest: Effort normal and breath sounds normal.  Abdominal: Soft. Bowel sounds are normal.  Obese abdomen  Musculoskeletal: She exhibits tenderness. She exhibits no edema.  Neurological: She is alert and oriented to person, place, and time.  Skin: Skin is warm and dry.  Psychiatric: She has a normal mood and affect. Her behavior is normal. Judgment and thought content normal.          Assessment & Plan:  #1 degenerative joint disease of bilateral knees: Will start course of Vicodin 7.5/300 every 6 hours when necessary for pain and refer to bariatric surgery information session  #2 morbid obesity with BMI greater than 60: Will refer for bariatric surgery information session at [facility] -Hand out out given with highlighted contact information

## 2012-07-09 NOTE — Patient Instructions (Addendum)
Since the tramadol and cortisone injections to your knee have not helped your knee pain, we will try hydrocodone.  This will require a pain contract. We will also give you a hand-out on Gastric Bypass Information Sessions.

## 2012-07-10 ENCOUNTER — Telehealth: Payer: Self-pay | Admitting: *Deleted

## 2012-07-10 DIAGNOSIS — I5022 Chronic systolic (congestive) heart failure: Secondary | ICD-10-CM

## 2012-07-10 MED ORDER — ISOSORBIDE MONONITRATE ER 60 MG PO TB24: ORAL_TABLET | ORAL | Status: DC

## 2012-07-10 NOTE — Telephone Encounter (Signed)
Pt advised of change

## 2012-07-10 NOTE — Telephone Encounter (Signed)
Pt called and stated her script has not been called to pharmacy, in review i see it is for the 7.5/ 300 should it have been the 7.5/ 325?, i have called the 7.5/325 in to pharm but if i need to change it please let me know.

## 2012-07-10 NOTE — Telephone Encounter (Signed)
Could not get authorization for isosorbide dinitrate 40mg  tid [phone]. I spoke with April, Patrice, and [place].  Isosorbide mononitrate is covered. Per Dr Darvin Neighbours will prescribe Imdur 90 mg daily in the place of isosorbide dinitrate 40mg  tid. LMTCB for pt to let her know.

## 2012-07-16 ENCOUNTER — Ambulatory Visit (HOSPITAL_COMMUNITY)
Admission: RE | Admit: 2012-07-16 | Discharge: 2012-07-16 | Disposition: A | Discharge status: Home / Self Care | Payer: Medicare Other | Source: Ambulatory Visit | Attending: Internal Medicine | Admitting: Internal Medicine

## 2012-07-16 DIAGNOSIS — Z1231 Encounter for screening mammogram for malignant neoplasm of breast: Secondary | ICD-10-CM

## 2012-07-16 DIAGNOSIS — Z0000 Encounter for general adult medical examination without abnormal findings: Secondary | ICD-10-CM

## 2012-08-09 ENCOUNTER — Other Ambulatory Visit: Payer: Self-pay | Admitting: *Deleted

## 2012-08-09 MED ORDER — AMITRIPTYLINE HCL 150 MG PO TABS: 150.0000 mg | ORAL_TABLET | Freq: Every evening | ORAL | Status: DC | PRN

## 2012-08-10 ENCOUNTER — Emergency Department (HOSPITAL_COMMUNITY): Payer: Medicare Other

## 2012-08-10 ENCOUNTER — Emergency Department (HOSPITAL_COMMUNITY)
Admission: EM | Admit: 2012-08-10 | Discharge: 2012-08-11 | Disposition: A | Discharge status: Home / Self Care | Payer: Medicare Other | Attending: Emergency Medicine | Admitting: Emergency Medicine

## 2012-08-10 ENCOUNTER — Encounter (HOSPITAL_COMMUNITY): Payer: Self-pay | Admitting: Emergency Medicine

## 2012-08-10 DIAGNOSIS — Z8659 Personal history of other mental and behavioral disorders: Secondary | ICD-10-CM | POA: Insufficient documentation

## 2012-08-10 DIAGNOSIS — R0601 Orthopnea: Secondary | ICD-10-CM

## 2012-08-10 DIAGNOSIS — G8929 Other chronic pain: Secondary | ICD-10-CM | POA: Insufficient documentation

## 2012-08-10 DIAGNOSIS — Z9861 Coronary angioplasty status: Secondary | ICD-10-CM | POA: Insufficient documentation

## 2012-08-10 DIAGNOSIS — M25569 Pain in unspecified knee: Secondary | ICD-10-CM | POA: Insufficient documentation

## 2012-08-10 DIAGNOSIS — Z9981 Dependence on supplemental oxygen: Secondary | ICD-10-CM | POA: Insufficient documentation

## 2012-08-10 DIAGNOSIS — G479 Sleep disorder, unspecified: Secondary | ICD-10-CM | POA: Insufficient documentation

## 2012-08-10 DIAGNOSIS — Z8709 Personal history of other diseases of the respiratory system: Secondary | ICD-10-CM | POA: Insufficient documentation

## 2012-08-10 DIAGNOSIS — Z954 Presence of other heart-valve replacement: Secondary | ICD-10-CM | POA: Insufficient documentation

## 2012-08-10 DIAGNOSIS — Z87891 Personal history of nicotine dependence: Secondary | ICD-10-CM | POA: Insufficient documentation

## 2012-08-10 DIAGNOSIS — I509 Heart failure, unspecified: Secondary | ICD-10-CM | POA: Insufficient documentation

## 2012-08-10 DIAGNOSIS — Z79899 Other long term (current) drug therapy: Secondary | ICD-10-CM | POA: Insufficient documentation

## 2012-08-10 DIAGNOSIS — I10 Essential (primary) hypertension: Secondary | ICD-10-CM | POA: Insufficient documentation

## 2012-08-10 DIAGNOSIS — G4733 Obstructive sleep apnea (adult) (pediatric): Secondary | ICD-10-CM | POA: Insufficient documentation

## 2012-08-10 DIAGNOSIS — R079 Chest pain, unspecified: Secondary | ICD-10-CM | POA: Insufficient documentation

## 2012-08-10 DIAGNOSIS — Z8639 Personal history of other endocrine, nutritional and metabolic disease: Secondary | ICD-10-CM | POA: Insufficient documentation

## 2012-08-10 DIAGNOSIS — J9620 Acute and chronic respiratory failure, unspecified whether with hypoxia or hypercapnia: Secondary | ICD-10-CM | POA: Insufficient documentation

## 2012-08-10 DIAGNOSIS — M549 Dorsalgia, unspecified: Secondary | ICD-10-CM | POA: Insufficient documentation

## 2012-08-10 DIAGNOSIS — Z95810 Presence of automatic (implantable) cardiac defibrillator: Secondary | ICD-10-CM | POA: Insufficient documentation

## 2012-08-10 DIAGNOSIS — K219 Gastro-esophageal reflux disease without esophagitis: Secondary | ICD-10-CM | POA: Insufficient documentation

## 2012-08-10 DIAGNOSIS — Z862 Personal history of diseases of the blood and blood-forming organs and certain disorders involving the immune mechanism: Secondary | ICD-10-CM | POA: Insufficient documentation

## 2012-08-10 DIAGNOSIS — Z8739 Personal history of other diseases of the musculoskeletal system and connective tissue: Secondary | ICD-10-CM | POA: Insufficient documentation

## 2012-08-10 DIAGNOSIS — Z8742 Personal history of other diseases of the female genital tract: Secondary | ICD-10-CM | POA: Insufficient documentation

## 2012-08-10 DIAGNOSIS — Z87440 Personal history of urinary (tract) infections: Secondary | ICD-10-CM | POA: Insufficient documentation

## 2012-08-10 DIAGNOSIS — J4489 Other specified chronic obstructive pulmonary disease: Secondary | ICD-10-CM | POA: Insufficient documentation

## 2012-08-10 DIAGNOSIS — Z86718 Personal history of other venous thrombosis and embolism: Secondary | ICD-10-CM | POA: Insufficient documentation

## 2012-08-10 DIAGNOSIS — J449 Chronic obstructive pulmonary disease, unspecified: Secondary | ICD-10-CM | POA: Insufficient documentation

## 2012-08-10 DIAGNOSIS — Z7982 Long term (current) use of aspirin: Secondary | ICD-10-CM | POA: Insufficient documentation

## 2012-08-10 DIAGNOSIS — Z8719 Personal history of other diseases of the digestive system: Secondary | ICD-10-CM | POA: Insufficient documentation

## 2012-08-10 DIAGNOSIS — Z87448 Personal history of other diseases of urinary system: Secondary | ICD-10-CM | POA: Insufficient documentation

## 2012-08-10 DIAGNOSIS — Z8679 Personal history of other diseases of the circulatory system: Secondary | ICD-10-CM | POA: Insufficient documentation

## 2012-08-10 DIAGNOSIS — R609 Edema, unspecified: Secondary | ICD-10-CM | POA: Insufficient documentation

## 2012-08-10 DIAGNOSIS — M069 Rheumatoid arthritis, unspecified: Secondary | ICD-10-CM | POA: Insufficient documentation

## 2012-08-10 LAB — POCT I-STAT TROPONIN I

## 2012-08-10 LAB — BASIC METABOLIC PANEL
BUN: 19 mg/dL (ref 6–23)
Calcium: 9.6 mg/dL (ref 8.4–10.5)
GFR calc Af Amer: 71 mL/min — ABNORMAL LOW (ref >90)
GFR calc non Af Amer: 61 mL/min — ABNORMAL LOW (ref >90)
Potassium: 4.7 mEq/L (ref 3.5–5.1)
Sodium: 136 mEq/L (ref 135–145)

## 2012-08-10 LAB — CBC
HCT: 32.9 % — ABNORMAL LOW (ref 36.0–46.0)
MCH: 23.1 pg — ABNORMAL LOW (ref 26.0–34.0)
MCHC: 31.3 g/dL (ref 30.0–36.0)
RDW: 17.4 % — ABNORMAL HIGH (ref 11.5–15.5)

## 2012-08-10 MED ORDER — IOHEXOL 350 MG/ML SOLN
100.0000 mL | Freq: Once | INTRAVENOUS | Status: AC | PRN
  Administered 2012-08-10: 100 mL via INTRAVENOUS

## 2012-08-10 MED ORDER — OXYCODONE-ACETAMINOPHEN 5-325 MG PO TABS
1.0000 | ORAL_TABLET | Freq: Once | ORAL | Status: AC
  Administered 2012-08-10: 1 via ORAL
  Filled 2012-08-10: qty 1

## 2012-08-10 NOTE — ED Notes (Signed)
Pt ambulated to restroom; gave urine sample and ambulated back to room.  Pt extremely SOB and states "its getting worse...the breathing." RN aware and pt placed back on monitor sats noted to be 98% RA.

## 2012-08-10 NOTE — ED Notes (Addendum)
C/o sob since last night that is worse when lying flat and bilateral lower extremity edema.  Reports history of CHF. Speaking in complete sentences.  1 episode of sharp pain to center of chest that lasted a few seconds earlier today.

## 2012-08-10 NOTE — ED Provider Notes (Signed)
History    This chart was scribed for non-physician practitioner working with Dione Booze, MD by Leone Payor, ED Scribe. This patient was seen in room CD10C/CD10C and the patient's care was started at 1932.   CSN: [id number]  Arrival date & time 08/10/12  1932   None     Chief Complaint  Patient presents with  . Shortness of Breath     The history is provided by the patient. No language interpreter was used.    [name] is a 52 y.o. female who presents to the Emergency Department complaining of new, coming and going SOB onset 3 days ago. Pt states the SOB is aggravated with lying flat. She has associated bilateral lower extremity edema and had 1 episode of sharp pain to the center of chest that lasted a few seconds earlier today. Long walks make her SOB but short distance walks do not. Pt states she normally has trouble sleeping at night.   Pt is a former smoker but denies alcohol use.  Pt has h/o CHF, HTN, SOB, COPD, severe mitral regurgitation.  Past Medical History  Diagnosis Date  . Severe mitral regurgitation     Minimally invasive vale repair on 12/18/08. Additionally TV repair  and PFO closure.  . CHF (congestive heart failure)     TEE(6/10): EF 40%  . Microcytic anemia     EGD and colonoscopy in 12/11 did not reveal source of bleeding.Plan capsule endoscopy.  . Gastric ulcer   . Morbid obesity   . Obstructive sleep apnea     on CPAP  . GERD (gastroesophageal reflux disease)   . HTN (hypertension)   . Gout   . Depression   . Colitis 2/11  . SOB (shortness of breath)   . Chronic back pain   . Chronic knee pain   . DVT (deep venous thrombosis)     right leg  . Anxiety   . Rheumatoid arthritis     "shoulders & knees"  . Sleep disturbance     07/14/11 "haven't slept in 10 months; since going to [facility]"  . Hx: UTI (urinary tract infection)   . Nocturia   . COPD (chronic obstructive pulmonary disease)   . Bronchitis   . PTSD (post-traumatic stress disorder)    . Acute-on-chronic respiratory failure   . Schizophrenia   . Dysmenorrhea     Past Surgical History  Procedure Laterality Date  . Cardiac valve replacement  12/2008    cardiac  . Cardiac catheterization  04/20/2009, 11/04/2008  . Right knee arthroscopy  06/11/2004  . Extraction of teeth  12/04/2008  . Right mini thoracotomy for mitral valve repair and closure of foreman ovale  12/18/2008  . Cardiac defibrillator placement      Family History  Problem Relation Age of Onset  . Hypertension Sister     History  Substance Use Topics  . Smoking status: Former Smoker -- 0.50 packs/day for 27 years    Types: Cigarettes    Quit date: 12/18/2008  . Smokeless tobacco: Never Used  . Alcohol Use: No     Comment: "stopped drinking alcohol 12/2003; did drink ~ 6 shots/wk"    OB History   Grav Para Term Preterm Abortions TAB SAB Ect Mult Living   5 5 5       5       Review of Systems  Respiratory: Positive for shortness of breath.   Cardiovascular: Positive for chest pain and leg swelling.  All other systems  reviewed and are negative.    Allergies  Colchicine; Morphine; Risperidone; and Shrimp  Home Medications   Current Outpatient Rx  Name  Route  Sig  Dispense  Refill  . amitriptyline (ELAVIL) 150 MG tablet   Oral   Take 1 tablet (150 mg total) by mouth at bedtime as needed. For sleep   30 tablet   6   . aspirin EC 81 MG tablet   Oral   Take 81 mg by mouth daily.         . carvedilol (COREG) 25 MG tablet   Oral   Take 1 tablet (25 mg total) by mouth 2 (two) times daily.         Marland Kitchen esomeprazole (NEXIUM) 40 MG capsule   Oral   Take 1 capsule (40 mg total) by mouth daily before breakfast.   90 capsule   1   . hydrALAZINE (APRESOLINE) 50 MG tablet      1 and 1/2 tablets (total 75mg ) three times a day   135 tablet   6   . Hydrocodone-Acetaminophen 7.5-300 MG TABS   Oral   Take 1 tablet by mouth every 6 (six) hours as needed.   120 each   3   . isosorbide  mononitrate (IMDUR) 60 MG 24 hr tablet      1 and 1/2 tablets (total 90mg ) daily   45 tablet   6   . losartan (COZAAR) 100 MG tablet   Oral   Take 1 tablet (100 mg total) by mouth daily.         . metolazone (ZAROXOLYN) 2.5 MG tablet   Oral   Take 2.5 mg by mouth once a week. Take on Tuesdays 30 minutes before you take torsemide.         Marland Kitchen oxyCODONE-acetaminophen (PERCOCET/ROXICET) 5-325 MG per tablet   Oral   Take 1-2 tablets by mouth every 6 (six) hours as needed for pain.   20 tablet   0   . potassium chloride SA (K-DUR,KLOR-CON) 20 MEQ tablet   Oral   Take 40 mEq by mouth 2 (two) times daily.         . predniSONE (DELTASONE) 10 MG tablet   Oral   Take 2 tablets (20 mg total) by mouth 2 (two) times daily.   20 tablet   0   . spironolactone (ALDACTONE) 25 MG tablet   Oral   Take 25 mg by mouth daily.         Marland Kitchen torsemide (DEMADEX) 100 MG tablet   Oral   Take 100 mg by mouth daily.         . traMADol (ULTRAM) 50 MG tablet   Oral   Take 50-100 mg by mouth every 6 (six) hours as needed. For pain           BP 129/65  Pulse 89  Temp(Src) 98.2 F (36.8 C) (Oral)  Resp 22  SpO2 100%  LMP 05/05/2012  Physical Exam  Nursing note and vitals reviewed. Constitutional: She is oriented to person, place, and time. She appears well-developed and well-nourished. No distress.  HENT:  Head: Normocephalic and atraumatic.  Eyes: EOM are normal.  Neck: Neck supple. No tracheal deviation present.  Cardiovascular: Normal rate.   Muffled heart sounds due to body habitus.     Pulmonary/Chest: Effort normal and breath sounds normal. No respiratory distress. She has no wheezes. She has no rales. She exhibits no tenderness.  Abdominal: Soft. Bowel sounds are  normal. There is no tenderness.  Musculoskeletal: Normal range of motion. She exhibits edema (1+ to lower extremities). She exhibits no tenderness.  Neurological: She is alert and oriented to person, place, and  time.  Skin: Skin is warm and dry.  Psychiatric: She has a normal mood and affect. Her behavior is normal.    ED Course  Procedures (including critical care time)  DIAGNOSTIC STUDIES: Oxygen Saturation is 100% on room air, normal by my interpretation.    Date: 03/282014  Rate: 85  Rhythm: normal sinus rhythm  QRS Axis: normal  Intervals: normal  ST/T Wave abnormalities: normal (non-specific t-wave abnormalities  Conduction Disutrbances:none  Narrative Interpretation:   Old EKG Reviewed: unchanged  COORDINATION OF CARE: 9:00 PM Discussed treatment plan with pt at bedside and pt agreed to plan.    Results for orders placed during the hospital encounter of 08/10/12  CBC      Result Value Range   WBC 4.2  4.0 - 10.5 K/uL   RBC 4.45  3.87 - 5.11 MIL/uL   Hemoglobin 10.3 (*) 12.0 - 15.0 g/dL   HCT 16.1 (*) 09.6 - 04.5 %   MCV 73.9 (*) 78.0 - 100.0 fL   MCH 23.1 (*) 26.0 - 34.0 pg   MCHC 31.3  30.0 - 36.0 g/dL   RDW 40.9 (*) 81.1 - 91.4 %   Platelets 286  150 - 400 K/uL  BASIC METABOLIC PANEL      Result Value Range   Sodium 136  135 - 145 mEq/L   Potassium 4.7  3.5 - 5.1 mEq/L   Chloride 101  96 - 112 mEq/L   CO2 28  19 - 32 mEq/L   Glucose, Bld 111 (*) 70 - 99 mg/dL   BUN 19  6 - 23 mg/dL   Creatinine, Ser 7.82  0.50 - 1.10 mg/dL   Calcium 9.6  8.4 - 95.6 mg/dL   GFR calc non Af Amer 61 (*) >90 mL/min   GFR calc Af Amer 71 (*) >90 mL/min  PRO B NATRIURETIC PEPTIDE      Result Value Range   Pro B Natriuretic peptide (BNP) 167.6 (*) 0 - 125 pg/mL  POCT I-STAT TROPONIN I      Result Value Range   Troponin i, poc 0.00  0.00 - 0.08 ng/mL   Comment 3            Dg Chest 2 View  08/10/2012  *RADIOLOGY REPORT*  Clinical Data: Shortness of breath.  CHEST - 2 VIEW  Comparison: 12/31/2011  Findings: Two views of the chest demonstrate a left cardiac ICD with previous tricuspid valve repair.  There are chronic densities along the right hemithorax which have not significantly  changed. Findings are most likely postoperative changes.  No evidence for pulmonary edema.  Heart size is stable.  No evidence for pleural effusions.  IMPRESSION: Stable chest radiograph findings and postoperative changes.  No acute findings.   Original Report Authenticated By: Richarda Overlie, M.D.    Mm Digital Screening  07/18/2012  *RADIOLOGY REPORT*  Clinical Data: Screening.  DIGITAL BILATERAL SCREENING MAMMOGRAM WITH CAD  Comparison: Previous exams.  FINDINGS:  ACR Breast Density Category 1: The breast tissue is almost entirely fatty.  No suspicious masses, architectural distortion, or calcifications are present.  Images were processed with CAD.  IMPRESSION: No mammographic evidence of malignancy.  A result letter of this screening mammogram will be mailed directly to the patient.  RECOMMENDATION: Screening mammogram in one year. (  Code:SM-B-01Y)  BI-RADS CATEGORY 1:  Negative.   Original Report Authenticated By: Norva Pavlov, M.D.       Labs Reviewed  CBC  BASIC METABOLIC PANEL  PRO B NATRIURETIC PEPTIDE   No results found.   No diagnosis found.  No rales on exam.  Patient is not dyspneic or tachycardic.  No fever. Troponin negative.  ECG without indication of ischemia.    O2 sats 98% on room air.  Patient is not PERC negative.  D-dimer obtained, elevated at 3.47.  CTA chest requested.  No indication of pulmonary embolism.  Patient discharged home with follow-up by PCP.   MDM     I personally performed the services described in this documentation, which was scribed in my presence. The recorded information has been reviewed and is accurate.      Jimmye Norman, NP 08/11/12 [phone]

## 2012-08-11 NOTE — ED Provider Notes (Signed)
Medical screening examination/treatment/procedure(s) were performed by non-physician practitioner and as supervising physician I was immediately available for consultation/collaboration.   Dione Booze, MD 08/11/12 [phone]

## 2012-08-13 ENCOUNTER — Ambulatory Visit (HOSPITAL_COMMUNITY)
Admission: RE | Admit: 2012-08-13 | Discharge: 2012-08-13 | Disposition: A | Discharge status: Home / Self Care | Payer: Medicare Other | Source: Ambulatory Visit | Attending: Internal Medicine | Admitting: Internal Medicine

## 2012-08-13 VITALS — BP 120/76 | HR 112 | Resp 23 | Ht 66.0 in | Wt 375.8 lb

## 2012-08-13 DIAGNOSIS — I509 Heart failure, unspecified: Secondary | ICD-10-CM | POA: Insufficient documentation

## 2012-08-13 DIAGNOSIS — I5022 Chronic systolic (congestive) heart failure: Secondary | ICD-10-CM | POA: Insufficient documentation

## 2012-08-13 MED ORDER — TORSEMIDE 100 MG PO TABS: 100.0000 mg | ORAL_TABLET | Freq: Every day | ORAL | Status: DC

## 2012-08-13 NOTE — Patient Instructions (Addendum)
Take Metolazone 2.5 today   Please take Torsemide 100 mg daily  Follow up in 2 weeks   Do the following things EVERYDAY: 1) Weigh yourself in the morning before breakfast. Write it down and keep it in a log. 2) Take your medicines as prescribed 3) Eat low salt foods-Limit salt (sodium) to 2000 mg per day.  4) Stay as active as you can everyday 5) Limit all fluids for the day to less than 2 liters

## 2012-08-13 NOTE — Assessment & Plan Note (Signed)
NYHA IIIb. Volume status elevated in the setting on medication noncompliance. She has been out of Torsemide for over a week. Instructed to restart Torsemide 100 mg daily and take Metolazone 2.5 mg for the next two days. Reinforced daily weights , low salt food choices, medication compliance, and limiting fluid intake to < 2 liters per day. Follow up in 2 weeks to reassess volume.

## 2012-08-13 NOTE — Progress Notes (Signed)
Patient ID: [name], female   DOB: 1960/05/31, 52 y.o.   MRN: [id number] PCP: Dr Bosie Clos Cardiologist: Dr. Shirlee Latch  HPI: [name] is a 52 yo with systolic CHF probably secondary to severe MR now s/p MV repair, rheumatoid arthritis, schizophrenia, and PUD.     Echo in 6/13 showed EF 30-35% with no MR and no significant mitral stenosis.    Patient was seen in HF clinic in 2/13 and admitted because of volume overload.  She was diuresed and discharged on torsemide 80 qam, 40 qpm.  She was re-admitted in 3/13 after overdosing on Seroquel with hypotension.  She was re-admitted again in 5/13 for ADHF after she quit taking her cardiac meds.  She had a St Jude ICD placed in 8/13.  She was taken off hydralazine when she was in the hospital for ICD b/c of low BP. Now back on.   She returns for follow up today with her daughter.  Last visit with Dr Shirlee Latch he increased Isordil to 40 mg tid. Says she went to [facility] ED 08/09/12 due to increased dyspnea. Complains of knee pain. Ambulates with rolling walker.She has been out of Torsemide for 1 week. She has not been weighing because she does not have a scale. She plans to have gastric bypass surgery at [facility].  Drinking > 2 liters per day.    ROS: All pertinent positives and negatives as in HPI, otherwise negative  Allergies:  1) ! * Colchicine  2) ! Morphine   Past History:  1. Congestive heart failure, most likely secondary to severe mitral regurgitation. TEE (6/10): EF 40%, diffuse hypokinesis, mild to moderately dilated LV, severe eccentric MR, small PFO. Cardiac MRI (6/10): EF 37% with global hypokinesis, moderate to severe MR, no delayed enhancement (no evidence for sarcoidosis or other infiltrative disease). TTE (10/10) after MV repair: EF 25-30%, moderately dilated LV, moderate diastolic dysfunction, mildly depressed RV function, trivial MR, MV mean gradient 5 mmHg, PASP 30 mmHg. RHC (12/10): mean RA 19, PA 46/26, mean PCWP 28, CI 2.5, SVO2 63%.  TTE (2/11): EF 35-40% with diffuse hypokinesis, no significant MR or MS. TTE (7/11): EF 45%, mild global hypokinesis, s/p MV repair, no mitral regurgitation, minimal mitral stenosis, PA systolic pressure 40 mmHg, mild LV dilation.  TTE (11/12): EF 30-35%, mild LV dilation, mild LVH, s/p MV repair with no regurgitation and minimal stenosis.  TTE (3/13): EF 25-30%, diffuse hypokinesis, no significant mitral stenosis by pressure halftime with mean gradient 7 mmHg across valve, no MR.  Echo (6/13): EF 30-35%, mildly dilated LV, mild mitral stenosis but no MR.  St Jude ICD placed in 8/13.  2. Severe mitral regurgitation (see above). Minimally invasive mitral valve repair on 12/18/08. She additionally had TV repair and PFO closure.  3. Microcytic anemia: EGD and colonoscopy in 12/11 did not reveal source of bleeding.  4. H/o gastric ulcers.  5. Morbid obesity.  6. Obstructive sleep apnea, on CPAP.  7. GERD.  8. Hypertension, controlled.  9. Rheumatoid arthritis  10. Gout.  11. Depression.  12. LHC (6/10): No angiographic CAD. Mildly dilated LV. 3+ MR. EF 40%.  13. Prior smoking, quit  14. Colitis (2/11)  15. Schizophrenia versus schizoaffective disorder 16. History of right leg DVT    Current Outpatient Prescriptions  Medication Sig Dispense Refill  . amitriptyline (ELAVIL) 150 MG tablet Take 1 tablet (150 mg total) by mouth at bedtime as needed. For sleep  30 tablet  6  . aspirin EC 81  MG tablet Take 81 mg by mouth daily.      . carvedilol (COREG) 12.5 MG tablet Take 25 mg by mouth 2 (two) times daily with a meal.      . esomeprazole (NEXIUM) 40 MG capsule Take 1 capsule (40 mg total) by mouth daily before breakfast.  90 capsule  1  . hydrALAZINE (APRESOLINE) 50 MG tablet 1 and 1/2 tablets (total 75mg ) three times a day  135 tablet  6  . HYDROcodone-acetaminophen (NORCO) 7.5-325 MG per tablet Take 1 tablet by mouth every 6 (six) hours as needed for pain.      . isosorbide mononitrate (IMDUR) 60 MG  24 hr tablet 1 and 1/2 tablets (total 90mg ) daily  45 tablet  6  . losartan (COZAAR) 100 MG tablet Take 1 tablet (100 mg total) by mouth daily.      . metolazone (ZAROXOLYN) 2.5 MG tablet Take 2.5 mg by mouth once a week. Take on Tuesdays 30 minutes before you take torsemide.      . potassium chloride SA (K-DUR,KLOR-CON) 20 MEQ tablet Take 40 mEq by mouth 2 (two) times daily.      Marland Kitchen spironolactone (ALDACTONE) 25 MG tablet Take 25 mg by mouth daily.      Marland Kitchen torsemide (DEMADEX) 100 MG tablet Take 1 tablet (100 mg total) by mouth daily.  30 tablet  6  . [DISCONTINUED] QUEtiapine (SEROQUEL) 100 MG tablet Take 400 mg by mouth at bedtime.        No current facility-administered medications for this encounter.     Physical Exam:  Filed Vitals:   08/13/12 1354  BP: 120/76  Pulse: 112  Resp: 23  Height: 5\' 6"  (1.676 m)  Weight: 375 lb 12.8 oz (170.462 kg)  SpO2: 97%    General: NAD, obese. Daughter Neck: JVP hard to see does appear slightly  elevated, no thyromegaly or thyroid nodule. No carotid bruit.  Lungs: Clear to auscultation bilaterally with normal respiratory effort. CV: Nondisplaced PMI.  Heart regular S1/S2, no S3/S4, no murmur.  Abdomen: Obese Soft, nontender, no hepatosplenomegaly, no distention.  Neurologic: Alert and oriented x 3.  Psych: Normal affect. Extremities: No clubbing or cyanosis. Normal pedal pulses.   LLE 2+ RLE 2+ edema.

## 2012-08-22 ENCOUNTER — Encounter: Payer: Self-pay | Admitting: Internal Medicine

## 2012-08-22 DIAGNOSIS — R269 Unspecified abnormalities of gait and mobility: Secondary | ICD-10-CM | POA: Insufficient documentation

## 2012-08-29 ENCOUNTER — Ambulatory Visit (HOSPITAL_COMMUNITY)
Admission: RE | Admit: 2012-08-29 | Discharge: 2012-08-29 | Disposition: A | Discharge status: Home / Self Care | Payer: Medicare Other | Source: Ambulatory Visit | Attending: Internal Medicine | Admitting: Internal Medicine

## 2012-08-29 ENCOUNTER — Encounter (HOSPITAL_COMMUNITY): Payer: Self-pay

## 2012-08-29 VITALS — BP 114/62 | HR 110 | Wt 389.8 lb

## 2012-08-29 DIAGNOSIS — M109 Gout, unspecified: Secondary | ICD-10-CM | POA: Insufficient documentation

## 2012-08-29 DIAGNOSIS — D509 Iron deficiency anemia, unspecified: Secondary | ICD-10-CM | POA: Insufficient documentation

## 2012-08-29 DIAGNOSIS — Z79899 Other long term (current) drug therapy: Secondary | ICD-10-CM | POA: Insufficient documentation

## 2012-08-29 DIAGNOSIS — Z87891 Personal history of nicotine dependence: Secondary | ICD-10-CM | POA: Insufficient documentation

## 2012-08-29 DIAGNOSIS — F209 Schizophrenia, unspecified: Secondary | ICD-10-CM | POA: Insufficient documentation

## 2012-08-29 DIAGNOSIS — Z95810 Presence of automatic (implantable) cardiac defibrillator: Secondary | ICD-10-CM | POA: Insufficient documentation

## 2012-08-29 DIAGNOSIS — F329 Major depressive disorder, single episode, unspecified: Secondary | ICD-10-CM | POA: Insufficient documentation

## 2012-08-29 DIAGNOSIS — Z7982 Long term (current) use of aspirin: Secondary | ICD-10-CM | POA: Insufficient documentation

## 2012-08-29 DIAGNOSIS — I10 Essential (primary) hypertension: Secondary | ICD-10-CM | POA: Insufficient documentation

## 2012-08-29 DIAGNOSIS — M069 Rheumatoid arthritis, unspecified: Secondary | ICD-10-CM | POA: Insufficient documentation

## 2012-08-29 DIAGNOSIS — K279 Peptic ulcer, site unspecified, unspecified as acute or chronic, without hemorrhage or perforation: Secondary | ICD-10-CM | POA: Insufficient documentation

## 2012-08-29 DIAGNOSIS — I509 Heart failure, unspecified: Secondary | ICD-10-CM | POA: Insufficient documentation

## 2012-08-29 DIAGNOSIS — F3289 Other specified depressive episodes: Secondary | ICD-10-CM | POA: Insufficient documentation

## 2012-08-29 DIAGNOSIS — G4733 Obstructive sleep apnea (adult) (pediatric): Secondary | ICD-10-CM | POA: Insufficient documentation

## 2012-08-29 DIAGNOSIS — I5022 Chronic systolic (congestive) heart failure: Secondary | ICD-10-CM

## 2012-08-29 DIAGNOSIS — Z86718 Personal history of other venous thrombosis and embolism: Secondary | ICD-10-CM | POA: Insufficient documentation

## 2012-08-29 DIAGNOSIS — K219 Gastro-esophageal reflux disease without esophagitis: Secondary | ICD-10-CM | POA: Insufficient documentation

## 2012-08-29 NOTE — Patient Instructions (Signed)
Take metolazone 2.5 mg today and tomorrow.  Pick up torsemide!!!  Follow up 2 weeks.

## 2012-08-29 NOTE — Assessment & Plan Note (Signed)
Volume status elevated due to medication and dietary indiscretions.  Have had a frank discussion with the patient and her daughter concerning heart failure and the need for compliance as I do not feel that she grasps the importance of the situation.  She says she knows what to eat and how much to drink but she continues to eat canned goods and drink >2 liters of fluid of day.  I have tried to discuss importance of medication, especially her fluid pills as she is 14 pounds up.  Have called in torsemide to [facility] as [facility] remains out of stock.  Her daughter states they will pick up her Rx today.  She will also take metolazone 2.5 mg today and tomorrow.  Follow up 2 weeks.

## 2012-08-29 NOTE — Progress Notes (Signed)
PCP: Dr Bosie Clos Primary Cardiologist: Dr. Shirlee Latch  HPI: [name] is a 52 yo with systolic CHF probably secondary to severe MR now s/p MV repair, rheumatoid arthritis, schizophrenia, and PUD.     Echo in 6/13 showed EF 30-35% with no MR and no significant mitral stenosis.    Patient was seen in HF clinic in 2/13 and admitted because of volume overload.  She was diuresed and discharged on torsemide 80 qam, 40 qpm.  She was re-admitted in 3/13 after overdosing on Seroquel with hypotension.  She was re-admitted again in 5/13 for ADHF after she quit taking her cardiac meds.  She had a St Jude ICD placed in 8/13.  She was taken off hydralazine when she was in the hospital for ICD b/c of low BP. Now back on.   She returns for follow up today with her daughter.  She c/o dyspnea, coughing and knee pain.  She has yet to pick up her torsemide because it is on back order at [facility] and didn't want to take Rx to new pharmacy.  She has been taking her metolazone every Tuesday.  She is not weighing daily because she says her scale does not go up high enough.  She has been sleeping on the couch for ~ 1 week.  She says [facility] is following.     ROS: All pertinent positives and negatives as in HPI, otherwise negative  Allergies:  1) ! * Colchicine  2) ! Morphine   Past History:  1. Congestive heart failure, most likely secondary to severe mitral regurgitation. TEE (6/10): EF 40%, diffuse hypokinesis, mild to moderately dilated LV, severe eccentric MR, small PFO. Cardiac MRI (6/10): EF 37% with global hypokinesis, moderate to severe MR, no delayed enhancement (no evidence for sarcoidosis or other infiltrative disease). TTE (10/10) after MV repair: EF 25-30%, moderately dilated LV, moderate diastolic dysfunction, mildly depressed RV function, trivial MR, MV mean gradient 5 mmHg, PASP 30 mmHg. RHC (12/10): mean RA 19, PA 46/26, mean PCWP 28, CI 2.5, SVO2 63%. TTE (2/11): EF 35-40% with diffuse hypokinesis, no  significant MR or MS. TTE (7/11): EF 45%, mild global hypokinesis, s/p MV repair, no mitral regurgitation, minimal mitral stenosis, PA systolic pressure 40 mmHg, mild LV dilation.  TTE (11/12): EF 30-35%, mild LV dilation, mild LVH, s/p MV repair with no regurgitation and minimal stenosis.  TTE (3/13): EF 25-30%, diffuse hypokinesis, no significant mitral stenosis by pressure halftime with mean gradient 7 mmHg across valve, no MR.  Echo (6/13): EF 30-35%, mildly dilated LV, mild mitral stenosis but no MR.  St Jude ICD placed in 8/13.  2. Severe mitral regurgitation (see above). Minimally invasive mitral valve repair on 12/18/08. She additionally had TV repair and PFO closure.  3. Microcytic anemia: EGD and colonoscopy in 12/11 did not reveal source of bleeding.  4. H/o gastric ulcers.  5. Morbid obesity.  6. Obstructive sleep apnea, on CPAP.  7. GERD.  8. Hypertension, controlled.  9. Rheumatoid arthritis  10. Gout.  11. Depression.  12. LHC (6/10): No angiographic CAD. Mildly dilated LV. 3+ MR. EF 40%.  13. Prior smoking, quit  14. Colitis (2/11)  15. Schizophrenia versus schizoaffective disorder 16. History of right leg DVT    Current Outpatient Prescriptions  Medication Sig Dispense Refill  . amitriptyline (ELAVIL) 150 MG tablet Take 1 tablet (150 mg total) by mouth at bedtime as needed. For sleep  30 tablet  6  . aspirin EC 81 MG tablet Take 81  mg by mouth daily.      . carvedilol (COREG) 12.5 MG tablet Take 25 mg by mouth 2 (two) times daily with a meal.      . esomeprazole (NEXIUM) 40 MG capsule Take 1 capsule (40 mg total) by mouth daily before breakfast.  90 capsule  1  . hydrALAZINE (APRESOLINE) 50 MG tablet 1 and 1/2 tablets (total 75mg ) three times a day  135 tablet  6  . HYDROcodone-acetaminophen (NORCO) 7.5-325 MG per tablet Take 1 tablet by mouth every 6 (six) hours as needed for pain.      . isosorbide mononitrate (IMDUR) 60 MG 24 hr tablet 1 and 1/2 tablets (total 90mg ) daily   45 tablet  6  . losartan (COZAAR) 100 MG tablet Take 1 tablet (100 mg total) by mouth daily.      . metolazone (ZAROXOLYN) 2.5 MG tablet Take 2.5 mg by mouth once a week. Take on Tuesdays 30 minutes before you take torsemide.      . potassium chloride SA (K-DUR,KLOR-CON) 20 MEQ tablet Take 40 mEq by mouth 2 (two) times daily.      Marland Kitchen spironolactone (ALDACTONE) 25 MG tablet Take 25 mg by mouth daily.      Marland Kitchen torsemide (DEMADEX) 100 MG tablet Take 1 tablet (100 mg total) by mouth daily.  30 tablet  6  . [DISCONTINUED] QUEtiapine (SEROQUEL) 100 MG tablet Take 400 mg by mouth at bedtime.        No current facility-administered medications for this encounter.     Physical Exam:  Filed Vitals:   08/29/12 1128  BP: 114/62  Pulse: 110  Weight: 389 lb 12.8 oz (176.812 kg)  SpO2: 97%    General: NAD, obese.  Neck: JVP hard to see does appear slightly  elevated, no thyromegaly or thyroid nodule. No carotid bruit.  Lungs: Clear to auscultation bilaterally with normal respiratory effort. CV: Nondisplaced PMI.  Heart regular S1/S2, no S3/S4, no murmur.  Abdomen: Obese Soft, nontender, no hepatosplenomegaly, no distention.  Neurologic: Alert and oriented x 3.  Psych: Normal affect. Extremities: No clubbing or cyanosis. Normal pedal pulses.  2-3+ lower extremity edema.

## 2012-08-29 NOTE — Assessment & Plan Note (Signed)
She states she has plans for bariatric surgery at [facility].  I have discussed the importance of compliance with medication because she will not be able to undergo surgery if we are not able to keep her fluid status better controlled.  She states she voices understanding.

## 2012-08-30 ENCOUNTER — Other Ambulatory Visit: Payer: Self-pay | Admitting: *Deleted

## 2012-08-30 DIAGNOSIS — I5022 Chronic systolic (congestive) heart failure: Secondary | ICD-10-CM

## 2012-08-30 MED ORDER — LOSARTAN POTASSIUM 100 MG PO TABS: 100.0000 mg | ORAL_TABLET | Freq: Every day | ORAL | Status: DC

## 2012-09-05 ENCOUNTER — Other Ambulatory Visit (HOSPITAL_COMMUNITY): Payer: Self-pay | Admitting: Physician Assistant

## 2012-09-12 ENCOUNTER — Ambulatory Visit (HOSPITAL_BASED_OUTPATIENT_CLINIC_OR_DEPARTMENT_OTHER)
Admission: RE | Admit: 2012-09-12 | Discharge: 2012-09-12 | Disposition: A | Discharge status: Home / Self Care | Payer: Medicare Other | Source: Ambulatory Visit | Attending: Internal Medicine | Admitting: Internal Medicine

## 2012-09-12 VITALS — BP 88/48 | HR 98 | Wt 384.8 lb

## 2012-09-12 DIAGNOSIS — I5022 Chronic systolic (congestive) heart failure: Secondary | ICD-10-CM

## 2012-09-12 LAB — BASIC METABOLIC PANEL WITH GFR
BUN: 54 mg/dL — ABNORMAL HIGH (ref 6–23)
CO2: 30 meq/L (ref 19–32)
Calcium: 9.4 mg/dL (ref 8.4–10.5)
Chloride: 97 meq/L (ref 96–112)
Creatinine, Ser: 1.83 mg/dL — ABNORMAL HIGH (ref 0.50–1.10)
GFR calc Af Amer: 36 mL/min — ABNORMAL LOW
GFR calc non Af Amer: 31 mL/min — ABNORMAL LOW
Glucose, Bld: 108 mg/dL — ABNORMAL HIGH (ref 70–99)
Potassium: 4.7 meq/L (ref 3.5–5.1)
Sodium: 135 meq/L (ref 135–145)

## 2012-09-12 MED ORDER — METOLAZONE 2.5 MG PO TABS: 2.5000 mg | ORAL_TABLET | ORAL | Status: DC

## 2012-09-12 NOTE — Patient Instructions (Addendum)
Increase metolazone to every other day.  Labs today  Follow up 1 week.   Do the following things EVERYDAY: 1) Weigh yourself in the morning before breakfast. Write it down and keep it in a log. 2) Take your medicines as prescribed 3) Eat low salt foods-Limit salt (sodium) to 2000 mg per day.  4) Stay as active as you can everyday 5) Limit all fluids for the day to less than 2 liters

## 2012-09-13 ENCOUNTER — Inpatient Hospital Stay (HOSPITAL_COMMUNITY)
Admission: AD | Admit: 2012-09-13 | Discharge: 2012-09-21 | DRG: 286 | Disposition: A | Discharge status: Home Health | Payer: Medicare Other | Source: Ambulatory Visit | Attending: Cardiology | Admitting: Cardiology

## 2012-09-13 ENCOUNTER — Encounter (HOSPITAL_COMMUNITY): Payer: Self-pay | Admitting: Nurse Practitioner

## 2012-09-13 DIAGNOSIS — J449 Chronic obstructive pulmonary disease, unspecified: Secondary | ICD-10-CM | POA: Diagnosis present

## 2012-09-13 DIAGNOSIS — I428 Other cardiomyopathies: Secondary | ICD-10-CM | POA: Diagnosis present

## 2012-09-13 DIAGNOSIS — J4489 Other specified chronic obstructive pulmonary disease: Secondary | ICD-10-CM | POA: Diagnosis present

## 2012-09-13 DIAGNOSIS — D509 Iron deficiency anemia, unspecified: Secondary | ICD-10-CM | POA: Diagnosis present

## 2012-09-13 DIAGNOSIS — F411 Generalized anxiety disorder: Secondary | ICD-10-CM | POA: Diagnosis present

## 2012-09-13 DIAGNOSIS — N179 Acute kidney failure, unspecified: Secondary | ICD-10-CM | POA: Diagnosis present

## 2012-09-13 DIAGNOSIS — Z91199 Patient's noncompliance with other medical treatment and regimen due to unspecified reason: Secondary | ICD-10-CM

## 2012-09-13 DIAGNOSIS — K219 Gastro-esophageal reflux disease without esophagitis: Secondary | ICD-10-CM | POA: Diagnosis present

## 2012-09-13 DIAGNOSIS — E119 Type 2 diabetes mellitus without complications: Secondary | ICD-10-CM | POA: Diagnosis present

## 2012-09-13 DIAGNOSIS — Z79899 Other long term (current) drug therapy: Secondary | ICD-10-CM

## 2012-09-13 DIAGNOSIS — F329 Major depressive disorder, single episode, unspecified: Secondary | ICD-10-CM | POA: Diagnosis present

## 2012-09-13 DIAGNOSIS — M069 Rheumatoid arthritis, unspecified: Secondary | ICD-10-CM | POA: Diagnosis present

## 2012-09-13 DIAGNOSIS — Z95810 Presence of automatic (implantable) cardiac defibrillator: Secondary | ICD-10-CM

## 2012-09-13 DIAGNOSIS — Z87891 Personal history of nicotine dependence: Secondary | ICD-10-CM

## 2012-09-13 DIAGNOSIS — M109 Gout, unspecified: Secondary | ICD-10-CM | POA: Diagnosis present

## 2012-09-13 DIAGNOSIS — I509 Heart failure, unspecified: Principal | ICD-10-CM | POA: Diagnosis present

## 2012-09-13 DIAGNOSIS — G4733 Obstructive sleep apnea (adult) (pediatric): Secondary | ICD-10-CM | POA: Diagnosis present

## 2012-09-13 DIAGNOSIS — F4310 Post-traumatic stress disorder, unspecified: Secondary | ICD-10-CM | POA: Diagnosis present

## 2012-09-13 DIAGNOSIS — N289 Disorder of kidney and ureter, unspecified: Secondary | ICD-10-CM

## 2012-09-13 DIAGNOSIS — Z86718 Personal history of other venous thrombosis and embolism: Secondary | ICD-10-CM

## 2012-09-13 DIAGNOSIS — I5023 Acute on chronic systolic (congestive) heart failure: Secondary | ICD-10-CM | POA: Diagnosis present

## 2012-09-13 DIAGNOSIS — F209 Schizophrenia, unspecified: Secondary | ICD-10-CM | POA: Diagnosis present

## 2012-09-13 DIAGNOSIS — I130 Hypertensive heart and chronic kidney disease with heart failure and stage 1 through stage 4 chronic kidney disease, or unspecified chronic kidney disease: Principal | ICD-10-CM | POA: Diagnosis present

## 2012-09-13 DIAGNOSIS — Z7982 Long term (current) use of aspirin: Secondary | ICD-10-CM

## 2012-09-13 DIAGNOSIS — Z6841 Body Mass Index (BMI) 40.0 and over, adult: Secondary | ICD-10-CM

## 2012-09-13 DIAGNOSIS — N183 Chronic kidney disease, stage 3 unspecified: Secondary | ICD-10-CM | POA: Diagnosis present

## 2012-09-13 DIAGNOSIS — I10 Essential (primary) hypertension: Secondary | ICD-10-CM | POA: Diagnosis present

## 2012-09-13 DIAGNOSIS — Z9119 Patient's noncompliance with other medical treatment and regimen: Secondary | ICD-10-CM

## 2012-09-13 DIAGNOSIS — I080 Rheumatic disorders of both mitral and aortic valves: Secondary | ICD-10-CM | POA: Diagnosis present

## 2012-09-13 DIAGNOSIS — E871 Hypo-osmolality and hyponatremia: Secondary | ICD-10-CM | POA: Diagnosis not present

## 2012-09-13 DIAGNOSIS — F3289 Other specified depressive episodes: Secondary | ICD-10-CM | POA: Diagnosis present

## 2012-09-13 DIAGNOSIS — E876 Hypokalemia: Secondary | ICD-10-CM | POA: Diagnosis not present

## 2012-09-13 DIAGNOSIS — K5900 Constipation, unspecified: Secondary | ICD-10-CM | POA: Diagnosis not present

## 2012-09-13 HISTORY — DX: Other cardiomyopathies: I42.8

## 2012-09-13 HISTORY — DX: Chronic systolic (congestive) heart failure: I50.22

## 2012-09-13 HISTORY — DX: Disorder of kidney and ureter, unspecified: N28.9

## 2012-09-13 HISTORY — DX: Hyperglycemia, unspecified: R73.9

## 2012-09-13 LAB — CBC WITH DIFFERENTIAL/PLATELET
Basophils Absolute: 0 10*3/uL (ref 0.0–0.1)
Basophils Relative: 1 % (ref 0–1)
MCHC: 32 g/dL (ref 30.0–36.0)
Monocytes Absolute: 0.3 10*3/uL (ref 0.1–1.0)
Neutro Abs: 2.8 10*3/uL (ref 1.7–7.7)
Neutrophils Relative %: 61 % (ref 43–77)
Platelets: 317 10*3/uL (ref 150–400)
RDW: 17.2 % — ABNORMAL HIGH (ref 11.5–15.5)

## 2012-09-13 LAB — COMPREHENSIVE METABOLIC PANEL
AST: 20 U/L (ref 0–37)
Albumin: 3.2 g/dL — ABNORMAL LOW (ref 3.5–5.2)
Chloride: 96 mEq/L (ref 96–112)
Creatinine, Ser: 1.73 mg/dL — ABNORMAL HIGH (ref 0.50–1.10)
Total Bilirubin: 0.2 mg/dL — ABNORMAL LOW (ref 0.3–1.2)

## 2012-09-13 LAB — TSH: TSH: 1.44 u[IU]/mL (ref 0.350–4.500)

## 2012-09-13 MED ORDER — SPIRONOLACTONE 25 MG PO TABS
25.0000 mg | ORAL_TABLET | Freq: Every day | ORAL | Status: DC
  Administered 2012-09-15 – 2012-09-21 (×7): 25 mg via ORAL
  Filled 2012-09-13 (×9): qty 1

## 2012-09-13 MED ORDER — HYDRALAZINE HCL 50 MG PO TABS
75.0000 mg | ORAL_TABLET | Freq: Three times a day (TID) | ORAL | Status: DC
  Administered 2012-09-13 – 2012-09-17 (×14): 75 mg via ORAL
  Filled 2012-09-13 (×21): qty 1

## 2012-09-13 MED ORDER — PANTOPRAZOLE SODIUM 40 MG PO TBEC
40.0000 mg | DELAYED_RELEASE_TABLET | Freq: Every day | ORAL | Status: DC
  Administered 2012-09-14 – 2012-09-21 (×8): 40 mg via ORAL
  Filled 2012-09-13 (×9): qty 1

## 2012-09-13 MED ORDER — ASPIRIN EC 81 MG PO TBEC
81.0000 mg | DELAYED_RELEASE_TABLET | Freq: Every day | ORAL | Status: DC
  Administered 2012-09-13 – 2012-09-21 (×9): 81 mg via ORAL
  Filled 2012-09-13 (×9): qty 1

## 2012-09-13 MED ORDER — ISOSORBIDE MONONITRATE ER 60 MG PO TB24
90.0000 mg | ORAL_TABLET | Freq: Every day | ORAL | Status: DC
  Administered 2012-09-14 – 2012-09-21 (×8): 90 mg via ORAL
  Filled 2012-09-13 (×9): qty 1

## 2012-09-13 MED ORDER — AMITRIPTYLINE HCL 75 MG PO TABS
150.0000 mg | ORAL_TABLET | Freq: Every evening | ORAL | Status: DC | PRN
  Administered 2012-09-13 – 2012-09-20 (×8): 150 mg via ORAL
  Filled 2012-09-13 (×8): qty 2

## 2012-09-13 MED ORDER — SODIUM CHLORIDE 0.9 % IJ SOLN: 3.0000 mL | INTRAMUSCULAR | Status: DC | PRN

## 2012-09-13 MED ORDER — FUROSEMIDE 10 MG/ML IJ SOLN
80.0000 mg | Freq: Two times a day (BID) | INTRAMUSCULAR | Status: DC
  Administered 2012-09-13: 80 mg via INTRAVENOUS
  Filled 2012-09-13 (×3): qty 8

## 2012-09-13 MED ORDER — ACETAMINOPHEN 325 MG PO TABS
650.0000 mg | ORAL_TABLET | ORAL | Status: DC | PRN
  Administered 2012-09-16: 650 mg via ORAL
  Filled 2012-09-13 (×2): qty 2

## 2012-09-13 MED ORDER — HEPARIN SODIUM (PORCINE) 5000 UNIT/ML IJ SOLN
5000.0000 [IU] | Freq: Three times a day (TID) | INTRAMUSCULAR | Status: DC
  Administered 2012-09-13 – 2012-09-20 (×21): 5000 [IU] via SUBCUTANEOUS
  Filled 2012-09-13 (×26): qty 1

## 2012-09-13 MED ORDER — SODIUM CHLORIDE 0.9 % IV SOLN: 250.0000 mL | INTRAVENOUS | Status: DC | PRN

## 2012-09-13 MED ORDER — HYDROCODONE-ACETAMINOPHEN 7.5-325 MG PO TABS
1.0000 | ORAL_TABLET | Freq: Four times a day (QID) | ORAL | Status: DC | PRN
  Administered 2012-09-13 – 2012-09-21 (×16): 1 via ORAL
  Filled 2012-09-13 (×17): qty 1

## 2012-09-13 MED ORDER — ONDANSETRON HCL 4 MG/2ML IJ SOLN
4.0000 mg | Freq: Four times a day (QID) | INTRAMUSCULAR | Status: DC | PRN
  Administered 2012-09-17 – 2012-09-18 (×3): 4 mg via INTRAVENOUS
  Filled 2012-09-13 (×3): qty 2

## 2012-09-13 MED ORDER — CARVEDILOL 25 MG PO TABS
25.0000 mg | ORAL_TABLET | Freq: Two times a day (BID) | ORAL | Status: DC
  Administered 2012-09-13 – 2012-09-17 (×9): 25 mg via ORAL
  Filled 2012-09-13 (×13): qty 1

## 2012-09-13 MED ORDER — SODIUM CHLORIDE 0.9 % IJ SOLN
3.0000 mL | Freq: Two times a day (BID) | INTRAMUSCULAR | Status: DC
  Administered 2012-09-13 – 2012-09-14 (×3): 3 mL via INTRAVENOUS

## 2012-09-13 MED ORDER — AMITRIPTYLINE HCL 75 MG PO TABS: 150.0000 mg | ORAL_TABLET | Freq: Every evening | ORAL | Status: DC | PRN

## 2012-09-13 MED ORDER — HYDROCODONE-ACETAMINOPHEN 7.5-325 MG PO TABS
1.0000 | ORAL_TABLET | Freq: Once | ORAL | Status: AC
  Administered 2012-09-13: 1 via ORAL
  Filled 2012-09-13: qty 1

## 2012-09-13 NOTE — Progress Notes (Signed)
Utilization review completed.  

## 2012-09-13 NOTE — H&P (Signed)
Patient ID: [name] MRN: [id number], DOB/AGE: [birth year]   Admit date: 09/13/2012  Primary Physician: Kristie Cowman, MD Primary Cardiologist: Golden Circle, MD  Pt. Profile:  52 y/o female with h/o NICM and MR s/p MV repair who is admitted today with progressive orthopnea.  Problem List  Past Medical History  Diagnosis Date  . Severe mitral regurgitation     a. Minimally invasive MV repair on 12/18/08. Additionally TV repair  and PFO closure.  . Chronic systolic CHF (congestive heart failure)     a. 12/2011 Echo: EF 30%, diff HK, mild LVH, Gr 1 DD, mildly dil LA.  . Microcytic anemia     EGD and colonoscopy in 12/11 did not reveal source of bleeding.Plan capsule endoscopy.  . Gastric ulcer   . NICM (nonischemic cardiomyopathy)     [id number]: s/p SJM 1311-36Q single lead ICD, Ser #: [id number]  . Obstructive sleep apnea     on CPAP  . GERD (gastroesophageal reflux disease)   . HTN (hypertension)   . Gout   . Depression   . Colitis 2/11  . Chronic back pain   . Chronic knee pain   . DVT (deep venous thrombosis)     right leg  . Anxiety   . Rheumatoid arthritis     "shoulders & knees"  . Sleep disturbance     07/14/11 "haven't slept in 10 months; since going to [place]"  . Hx: UTI (urinary tract infection)   . Nocturia   . COPD (chronic obstructive pulmonary disease)   . Bronchitis   . PTSD (post-traumatic stress disorder)   . Schizophrenia   . Dysmenorrhea   . Morbid obesity     Past Surgical History  Procedure Laterality Date  . Cardiac valve replacement  12/2008    cardiac  . Cardiac catheterization  04/20/2009, 11/04/2008  . Right knee arthroscopy  06/11/2004  . Extraction of teeth  12/04/2008  . Right mini thoracotomy for mitral valve repair and closure of [place]  12/18/2008  . Cardiac defibrillator placement       Allergies  Allergies  Allergen Reactions  . Colchicine Itching and Other (See Comments)    Whelps.  . Morphine Itching  . Risperidone  Other (See Comments)    Auditory hallucinations.  . Shrimp (Shellfish Allergy) Hives    Break out in whelps and itch.    HPI  52 y/o female with the above problem list.  She has chronic systolic chf/NICM and has recently been followed in the CHF clinic.  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  When seen in CHF clinic on 3/31, she had been out of her torsemide for a week.  This was resumed and metolazone 2.5mg  was added for 2 days.  When she f/u on 4/16, she was still felt to be volume overloaded and had still not picked up her torsemide.  She was up 14 lbs since prior visit and she was again instructed to refill her torsemide and use metolazone x 2 days then weekly dosing.  Over the past week, she has been experiencing significant orthopnea, causing her to sleep sitting up on her couch.  She denies any significant doe, but notes that she doesn't really exert herself either.  She does not routinely weigh herself.  She denies pnd - so long as she sleeps sitting up, dizziness, syncope, chest pain, change in abdominal girty, or significant LEE.  She does think  that her appetite has waned some over the past 3 days.  Yesterday, she was seen in CHF clinic and continued to exhibit volume overload.  Her weight was down from prior visit to 385 from 390, but still far off from the 370's of Feb and early March.  Labs were drawn and revealed acute creatinine elevation @ 1.83.  She was contacted today for admission and diuresis.  She currently has no complaints.  Home Medications  Prior to Admission medications   Medication Sig Start Date End Date Taking? Authorizing Provider  amitriptyline (ELAVIL) 150 MG tablet Take 1 tablet (150 mg total) by mouth at bedtime as needed. For sleep 08/09/12   Manuela Schwartz, MD  aspirin EC 81 MG tablet Take 81 mg by mouth daily.    Historical Provider, MD  carvedilol (COREG) 12.5 MG tablet Take 25 mg by mouth 2 (two) times  daily with a meal.    Historical Provider, MD  esomeprazole (NEXIUM) 40 MG capsule Take 1 capsule (40 mg total) by mouth daily before breakfast. 06/11/12   Manuela Schwartz, MD  hydrALAZINE (APRESOLINE) 50 MG tablet 1 and 1/2 tablets (total 75mg ) three times a day 07/03/12   Laurey Morale, MD  HYDROcodone-acetaminophen (NORCO) 7.5-325 MG per tablet Take 1 tablet by mouth every 6 (six) hours as needed for pain.    Historical Provider, MD  isosorbide mononitrate (IMDUR) 60 MG 24 hr tablet 1 and 1/2 tablets (total 90mg ) daily 07/10/12   Laurey Morale, MD  KLOR-CON M20 20 MEQ tablet TAKE TWO TABLETS BY MOUTH TWICE DAILY 09/05/12   Duke Salvia, MD  losartan (COZAAR) 100 MG tablet Take 1 tablet (100 mg total) by mouth daily. 08/30/12   Dolores Patty, MD  metolazone (ZAROXOLYN) 2.5 MG tablet Take 1 tablet (2.5 mg total) by mouth every other day. 09/12/12   Hadassah Pais, PA-C  spironolactone (ALDACTONE) 25 MG tablet Take 25 mg by mouth daily. 11/14/11 11/13/12  Laurey Morale, MD  torsemide (DEMADEX) 100 MG tablet Take 1 tablet (100 mg total) by mouth daily. 08/13/12 08/13/13  Amy Georgie Chard, NP   Family History  Family History  Problem Relation Age of Onset  . Hypertension Sister   . Alcoholism Father     died in his 51's or 80's  . Other Mother     alive & well @ 71.   Social History  History   Social History  . Marital Status: Single    Spouse Name: N/A    Number of Children: N/A  . Years of Education: N/A   Occupational History  . Not on file.   Social History Main Topics  . Smoking status: Former Smoker -- 0.50 packs/day for 27 years    Types: Cigarettes    Quit date: 12/18/2008  . Smokeless tobacco: Never Used  . Alcohol Use: No     Comment: "stopped drinking alcohol 12/2003; did drink ~ 6 shots/wk"  . Drug Use: No     Comment: "last cocaine use 2009"  . Sexually Active: Not Currently   Other Topics Concern  . Not on file   Social History Narrative   Pt lives in  [place] with her dtr.  On disability.    Review of Systems General:  No chills, fever, night sweats or weight changes.  Cardiovascular:  No chest pain, +++ dyspnea on exertion, +++ edema, +++ orthopnea, palpitations, paroxysmal nocturnal dyspnea. Dermatological: No rash, lesions/masses Respiratory: No cough, +++ dyspnea  Urologic: No hematuria, dysuria Abdominal:   No nausea, vomiting, diarrhea, bright red blood per rectum, melena, or hematemesis Neurologic:  No visual changes, wkns, changes in mental status. All other systems reviewed and are otherwise negative except as noted above.  Physical Exam  Blood pressure 102/67, pulse 90, temperature 97.7 F (36.5 C), temperature source Oral, height 5\' 6"  (1.676 m), weight 382 lb 14.4 oz (173.682 kg), last menstrual period 05/05/2012, SpO2 98.00%.  General: Pleasant, NAD, obese. Psych: Normal affect. Neuro: Alert and oriented X 3. Moves all extremities spontaneously. HEENT: Normal  Neck: Supple without bruits.  Difficult to assess jvp 2/2 obesity. Lungs:  Resp regular and unlabored, diminished @ bases, otw cta. Heart: RRR no s3, s4, or murmurs. Abdomen: Soft, non-tender, non-distended, BS + x 4.  Extremities: No clubbing, cyanosis or trace to 1+ bilat LE edema. DP/PT/Radials 2+ and equal bilaterally.  Labs   Lab Results  Component Value Date   WBC 4.2 08/10/2012   HGB 10.3* 08/10/2012   HCT 32.9* 08/10/2012   MCV 73.9* 08/10/2012   PLT 286 08/10/2012    Recent Labs Lab 09/12/12 1112  NA 135  K 4.7  CL 97  CO2 30  BUN 54*  CREATININE 1.83*  CALCIUM 9.4  GLUCOSE 108*   Radiology/Studies  No results found.  ASSESSMENT AND PLAN  1.  Acute on chronic systolic chf/NICM:  Pt presents with a 1+ month h/o volume overload that has been difficult to manage in the outpt setting r/t medication and lifestyle noncompliance and is now complicated by acute renal failure.  Will admit and place on IV lasix.  Follow creat closely with diuresis.   Cont bb, hydral/nitrate, spiro.  Hold arb.  CHF education.  2.  Acute renal failure:  Creat elevated to 1.83.  Follow with diuresis.  Signed, Nicolasa Ducking, NP 09/13/2012, 2:14 PM  Patient seen with NP, agree with the above note.  1. Acute/chronic systolic CHF: Volume overload on exam, symptoms worse.  She was out of torsemide for about 2 wks and has not been able to catch up despite getting back on her torsemide at home.  Weight up considerably.  Creatinine up.   - Agree with holding ARB for now, continue other CHF meds.  - Lasix 80 mg IV bid - Follow UOP and creatinine carefully, hopefully will not need inotropes. 2. AKI: May be hemodynamic.  She is quite volume overloaded and decreased renal venous pressure may lead to creatinine improvement.   [name] 09/13/2012 3:04 PM

## 2012-09-14 ENCOUNTER — Ambulatory Visit: Payer: Medicare Other | Admitting: Internal Medicine

## 2012-09-14 ENCOUNTER — Inpatient Hospital Stay (HOSPITAL_COMMUNITY): Payer: Medicare Other

## 2012-09-14 LAB — BASIC METABOLIC PANEL WITH GFR
BUN: 57 mg/dL — ABNORMAL HIGH (ref 6–23)
BUN: 55 mg/dL — ABNORMAL HIGH (ref 6–23)
CO2: 28 meq/L (ref 19–32)
CO2: 30 meq/L (ref 19–32)
Calcium: 9.3 mg/dL (ref 8.4–10.5)
Calcium: 9.5 mg/dL (ref 8.4–10.5)
Chloride: 92 meq/L — ABNORMAL LOW (ref 96–112)
Chloride: 91 meq/L — ABNORMAL LOW (ref 96–112)
Creatinine, Ser: 1.72 mg/dL — ABNORMAL HIGH (ref 0.50–1.10)
Creatinine, Ser: 1.69 mg/dL — ABNORMAL HIGH (ref 0.50–1.10)
GFR calc Af Amer: 39 mL/min — ABNORMAL LOW
GFR calc Af Amer: 39 mL/min — ABNORMAL LOW
GFR calc non Af Amer: 33 mL/min — ABNORMAL LOW
GFR calc non Af Amer: 34 mL/min — ABNORMAL LOW
Glucose, Bld: 137 mg/dL — ABNORMAL HIGH (ref 70–99)
Glucose, Bld: 133 mg/dL — ABNORMAL HIGH (ref 70–99)
Potassium: 5.8 meq/L — ABNORMAL HIGH (ref 3.5–5.1)
Potassium: 4.5 meq/L (ref 3.5–5.1)
Sodium: 129 meq/L — ABNORMAL LOW (ref 135–145)
Sodium: 130 meq/L — ABNORMAL LOW (ref 135–145)

## 2012-09-14 LAB — CBC
HCT: 32.8 % — ABNORMAL LOW (ref 36.0–46.0)
Hemoglobin: 10.5 g/dL — ABNORMAL LOW (ref 12.0–15.0)
RBC: 4.43 MIL/uL (ref 3.87–5.11)
WBC: 5.1 10*3/uL (ref 4.0–10.5)

## 2012-09-14 LAB — MRSA PCR SCREENING: MRSA by PCR: NEGATIVE

## 2012-09-14 MED ORDER — FUROSEMIDE 10 MG/ML IJ SOLN
80.0000 mg | Freq: Three times a day (TID) | INTRAMUSCULAR | Status: DC
  Administered 2012-09-14 – 2012-09-18 (×13): 80 mg via INTRAVENOUS
  Filled 2012-09-14 (×16): qty 8

## 2012-09-14 NOTE — Progress Notes (Addendum)
Patient ID: [name], female   DOB: [birth year], 52 y.o.   MRN: [id number]    SUBJECTIVE: Patient reports good urine output, breathing comfortably.  Labs drawn but not resulted this morning.  Marland Kitchen aspirin EC  81 mg Oral Daily  . carvedilol  25 mg Oral BID WC  . furosemide  80 mg Intravenous Q8H  . heparin  5,000 Units Subcutaneous Q8H  . hydrALAZINE  75 mg Oral TID  . isosorbide mononitrate  90 mg Oral Daily  . pantoprazole  40 mg Oral Daily  . sodium chloride  3 mL Intravenous Q12H  . spironolactone  25 mg Oral Daily      Filed Vitals:   09/13/12 1539 09/13/12 1638 09/13/12 2115 09/14/12 0433  BP: 102/67 104/67 114/67 109/77  Pulse: 90 90 85 88  Temp: 97.7 F (36.5 C)  97.5 F (36.4 C) 97.7 F (36.5 C)  TempSrc: Oral  Oral Oral  Resp: 16     Height:      Weight:    383 lb 3.2 oz (173.818 kg)  SpO2: 98%  100% 94%    Intake/Output Summary (Last 24 hours) at 09/14/12 0735 Last data filed at 09/13/12 1733  Gross per 24 hour  Intake      0 ml  Output   1000 ml  Net  -1000 ml    LABS: Basic Metabolic Panel:  Recent Labs  95/28/41 1112 09/13/12 1549  NA 135 133*  K 4.7 4.7  CL 97 96  CO2 30 26  GLUCOSE 108* 106*  BUN 54* 55*  CREATININE 1.83* 1.73*  CALCIUM 9.4 9.5  MG  --  2.2   Liver Function Tests:  Recent Labs  09/13/12 1549  AST 20  ALT 26  ALKPHOS 153*  BILITOT 0.2*  PROT 8.7*  ALBUMIN 3.2*   No results found for this basename: LIPASE, AMYLASE,  in the last 72 hours CBC:  Recent Labs  09/13/12 1549  WBC 4.7  NEUTROABS 2.8  HGB 10.7*  HCT 33.4*  MCV 74.7*  PLT 317   Cardiac Enzymes: No results found for this basename: CKTOTAL, CKMB, CKMBINDEX, TROPONINI,  in the last 72 hours BNP: No components found with this basename: POCBNP,  D-Dimer: No results found for this basename: DDIMER,  in the last 72 hours Hemoglobin A1C: No results found for this basename: HGBA1C,  in the last 72 hours Fasting Lipid Panel: No results found  for this basename: CHOL, HDL, LDLCALC, TRIG, CHOLHDL, LDLDIRECT,  in the last 72 hours Thyroid Function Tests:  Recent Labs  09/13/12 1549  TSH 1.440   Anemia Panel: No results found for this basename: VITAMINB12, FOLATE, FERRITIN, TIBC, IRON, RETICCTPCT,  in the last 72 hours  RADIOLOGY: No results found.  PHYSICAL EXAM General: NAD, obese Neck: JVP 12-14 cm, no thyromegaly or thyroid nodule.  Lungs: Clear to auscultation bilaterally with normal respiratory effort. CV: Nondisplaced PMI.  Heart regular S1/S2, no S3/S4, no murmur.  1+ edema to knees bilaterally.  No carotid bruit.  Normal pedal pulses.  Abdomen: Soft, nontender, no hepatosplenomegaly, no distention.  Neurologic: Alert and oriented x 3.  Psych: Normal affect. Extremities: No clubbing or cyanosis.   TELEMETRY: Reviewed telemetry pt in NSR  ASSESSMENT AND PLAN: 52 yo with history of mitral valve repair and nonischemic cardiomyopathy presented with acute on chronic systolic CHF. 1. Acute/chronic systolic CHF: Volume overload on exam, symptoms worse than baseline. She was out of torsemide for about 2 wks and  was not able to catch up despite getting back on her torsemide at home. Weight up considerably from baseline. Creatinine up.  - Holding ARB for now, continue other CHF meds.  Restart ARB prior to discharge.  - Lasix 80 mg IV every 8 hrs - Follow UOP and creatinine carefully, if creatinine worsens may need milrinone.  2. AKI: May be hemodynamic. She is quite volume overloaded and decreased renal venous pressure with diuresis may lead to improvement.  Awaiting BMET this morning.   [name] 09/14/2012 7:38 AM  Creatinine stable but K is elevated this morning.  Will repeat BMET.  May need milrinone to improve cardiac output.  Will await repeat BMET and make decision.   [name] 09/14/2012 10:48 AM  Repeat K is not elevated.  Creatinine a little lower.  Will continue current Lasix for now.  If poor urine  output today or rise in creatinine, would start milrinone at 0.25 mcg/kg/min, place PICC, and transfer to stepdown for CVP and co-ox monitoring.  Fluid restrict with mildly decreased sodium.   [name] 09/14/2012 12:47 PM

## 2012-09-15 DIAGNOSIS — I10 Essential (primary) hypertension: Secondary | ICD-10-CM

## 2012-09-15 DIAGNOSIS — Z95810 Presence of automatic (implantable) cardiac defibrillator: Secondary | ICD-10-CM

## 2012-09-15 LAB — BASIC METABOLIC PANEL
BUN: 58 mg/dL — ABNORMAL HIGH (ref 6–23)
CO2: 30 mEq/L (ref 19–32)
CO2: 27 mEq/L (ref 19–32)
Chloride: 90 mEq/L — ABNORMAL LOW (ref 96–112)
Chloride: 87 mEq/L — ABNORMAL LOW (ref 96–112)
Creatinine, Ser: 1.74 mg/dL — ABNORMAL HIGH (ref 0.50–1.10)
Glucose, Bld: 165 mg/dL — ABNORMAL HIGH (ref 70–99)
Sodium: 130 mEq/L — ABNORMAL LOW (ref 135–145)

## 2012-09-15 LAB — PRO B NATRIURETIC PEPTIDE: Pro B Natriuretic peptide (BNP): 85.5 pg/mL (ref 0–125)

## 2012-09-15 MED ORDER — HYDROCODONE-ACETAMINOPHEN 7.5-325 MG PO TABS: 1.0000 | ORAL_TABLET | Freq: Four times a day (QID) | ORAL | Status: DC | PRN

## 2012-09-15 MED ORDER — MILRINONE IN DEXTROSE 20 MG/100ML IV SOLN
0.1250 ug/kg/min | INTRAVENOUS | Status: DC
  Administered 2012-09-15 (×2): 0.25 ug/kg/min via INTRAVENOUS
  Administered 2012-09-16 – 2012-09-17 (×4): 0.375 ug/kg/min via INTRAVENOUS
  Administered 2012-09-17 (×2): 0.25 ug/kg/min via INTRAVENOUS
  Administered 2012-09-17 – 2012-09-18 (×4): 0.375 ug/kg/min via INTRAVENOUS
  Administered 2012-09-18: 0.25 ug/kg/min via INTRAVENOUS
  Administered 2012-09-19 (×5): 0.375 ug/kg/min via INTRAVENOUS
  Administered 2012-09-20: 0.25 ug/kg/min via INTRAVENOUS
  Administered 2012-09-20: 0.375 ug/kg/min via INTRAVENOUS
  Administered 2012-09-20: 0.125 ug/kg/min via INTRAVENOUS
  Filled 2012-09-15 (×21): qty 100

## 2012-09-15 MED ORDER — SODIUM CHLORIDE 0.9 % IJ SOLN: 10.0000 mL | INTRAMUSCULAR | Status: DC | PRN

## 2012-09-15 MED ORDER — HYDROCODONE-ACETAMINOPHEN 7.5-325 MG PO TABS
1.0000 | ORAL_TABLET | Freq: Once | ORAL | Status: AC
  Administered 2012-09-15: 1 via ORAL

## 2012-09-15 NOTE — Progress Notes (Signed)
Subjective: Breathing OK  No CP  Legs still big. Objective: Filed Vitals:   09/14/12 1425 09/14/12 1619 09/14/12 2100 09/15/12 0556  BP: 122/77 106/62 96/53 105/60  Pulse: 88 95 89 90  Temp: 97.8 F (36.6 C)  98.2 F (36.8 C) 98 F (36.7 C)  TempSrc: Oral  Oral Oral  Resp: 18  20 20   Height:      Weight:    381 lb 14.4 oz (173.229 kg)  SpO2: 99%  100% 97%   Weight change: -1 lb (-0.454 kg)  Intake/Output Summary (Last 24 hours) at 09/15/12 0933 Last data filed at 09/15/12 0650  Gross per 24 hour  Intake    480 ml  Output    750 ml  Net   -270 ml    General: Alert, awake, oriented x3, in no acute distress Neck: JVP is difficut to assess due to obesity Heart: Regular rate and rhythm, without murmurs, rubs, gallops.  Lungs: Clear to auscultation.  No rales or wheezes. Abdomen:  Mild RUQ tenderness Exemities:  1 to 2+ edema  Neuro: Grossly intact, nonfocal.   Lab Results: Results for orders placed during the hospital encounter of 09/13/12 (from the past 24 hour(s))  CBC     Status: Abnormal   Collection Time    09/14/12 10:23 AM      Result Value Range   WBC 5.1  4.0 - 10.5 K/uL   RBC 4.43  3.87 - 5.11 MIL/uL   Hemoglobin 10.5 (*) 12.0 - 15.0 g/dL   HCT 21.3 (*) 08.6 - 57.8 %   MCV 74.0 (*) 78.0 - 100.0 fL   MCH 23.7 (*) 26.0 - 34.0 pg   MCHC 32.0  30.0 - 36.0 g/dL   RDW 46.9 (*) 62.9 - 52.8 %   Platelets 330  150 - 400 K/uL  BASIC METABOLIC PANEL     Status: Abnormal   Collection Time    09/14/12 10:23 AM      Result Value Range   Sodium 130 (*) 135 - 145 mEq/L   Potassium 4.5  3.5 - 5.1 mEq/L   Chloride 91 (*) 96 - 112 mEq/L   CO2 30  19 - 32 mEq/L   Glucose, Bld 133 (*) 70 - 99 mg/dL   BUN 55 (*) 6 - 23 mg/dL   Creatinine, Ser 4.13 (*) 0.50 - 1.10 mg/dL   Calcium 9.5  8.4 - 24.4 mg/dL   GFR calc non Af Amer 34 (*) >90 mL/min   GFR calc Af Amer 39 (*) >90 mL/min  BASIC METABOLIC PANEL     Status: Abnormal   Collection Time    09/15/12  4:25 AM   Result Value Range   Sodium 131 (*) 135 - 145 mEq/L   Potassium 4.3  3.5 - 5.1 mEq/L   Chloride 90 (*) 96 - 112 mEq/L   CO2 30  19 - 32 mEq/L   Glucose, Bld 146 (*) 70 - 99 mg/dL   BUN 58 (*) 6 - 23 mg/dL   Creatinine, Ser 0.10 (*) 0.50 - 1.10 mg/dL   Calcium 9.4  8.4 - 27.2 mg/dL   GFR calc non Af Amer 33 (*) >90 mL/min   GFR calc Af Amer 38 (*) >90 mL/min    Studies/Results: @RISRSLT24 @  Medications:  Reviewed   @PROBHOSP @  1.  Acute on Chronic Systolic CHF  Urine output is not great for amt of lasix given  Cr is up a little from yesterday. Still edematous.  Review of assessment by [facility]  Will begin IV milrinone.  Tx to stepdown with PICC line and Coox Continue lasix  2.  REnal  Follow.  LOS: 2 days   Dietrich Pates 09/15/2012, 9:33 AM

## 2012-09-15 NOTE — Assessment & Plan Note (Signed)
Volume status markedly elevated with sluggish response to increase in oral diuretics. I have recommended hospital admission for IV diuresis. She is agreeable to this but wants to wait until tomorrow. Given that her respiratory status is stable I am fine with this. Will arrange for admission tomorrow. I have d/w Dr. Shirlee Latch (her primary HF cardiologist) who agrees.

## 2012-09-15 NOTE — Progress Notes (Signed)
PCP: Dr Bosie Clos Primary Cardiologist: Dr. Shirlee Latch  HPI: [name] is a 52 yo with a h/o morbid obesity, systolic CHF secondary to NICM and severe MR now s/p MV repair, rheumatoid arthritis, schizophrenia, and PUD.     Echo in 6/13 showed EF 30-35% with no MR and no significant mitral stenosis.    Patient was seen in HF clinic in 2/13 and admitted because of volume overload.  She was diuresed and discharged on torsemide 80 qam, 40 qpm.  She was re-admitted in 3/13 after overdosing on Seroquel with hypotension.  She was re-admitted again in 5/13 for ADHF after she quit taking her cardiac meds.  She had a St Jude ICD placed in 8/13.  She was taken off hydralazine when she was in the hospital for ICD b/c of low BP. Now back on.   She returns for 2 week follow up today.  Last visit she was given metolazone for 3 days and continued weekly dosing.  She was out of torsemide but has picked up torsemide since that time.  She c/o bilateral knee pain that is unchanged from prior.  Says dyspnea with exertion that is better than last visit.  She still is not following low sodium diet or fluid restrictions.  Weight starting to come down but remains significantly elevated (~30 pounds) over baseline. + edema   ROS: All pertinent positives and negatives as in HPI, otherwise negative  Allergies:  1) ! * Colchicine  2) ! Morphine   Past History:  1. Congestive heart failure, most likely secondary to severe mitral regurgitation. TEE (6/10): EF 40%, diffuse hypokinesis, mild to moderately dilated LV, severe eccentric MR, small PFO. Cardiac MRI (6/10): EF 37% with global hypokinesis, moderate to severe MR, no delayed enhancement (no evidence for sarcoidosis or other infiltrative disease). TTE (10/10) after MV repair: EF 25-30%, moderately dilated LV, moderate diastolic dysfunction, mildly depressed RV function, trivial MR, MV mean gradient 5 mmHg, PASP 30 mmHg. RHC (12/10): mean RA 19, PA 46/26, mean PCWP 28, CI  2.5, SVO2 63%. TTE (2/11): EF 35-40% with diffuse hypokinesis, no significant MR or MS. TTE (7/11): EF 45%, mild global hypokinesis, s/p MV repair, no mitral regurgitation, minimal mitral stenosis, PA systolic pressure 40 mmHg, mild LV dilation.  TTE (11/12): EF 30-35%, mild LV dilation, mild LVH, s/p MV repair with no regurgitation and minimal stenosis.  TTE (3/13): EF 25-30%, diffuse hypokinesis, no significant mitral stenosis by pressure halftime with mean gradient 7 mmHg across valve, no MR.  Echo (6/13): EF 30-35%, mildly dilated LV, mild mitral stenosis but no MR.  St Jude ICD placed in 8/13.  2. Severe mitral regurgitation (see above). Minimally invasive mitral valve repair on 12/18/08. She additionally had TV repair and PFO closure.  3. Microcytic anemia: EGD and colonoscopy in 12/11 did not reveal source of bleeding.  4. H/o gastric ulcers.  5. Morbid obesity.  6. Obstructive sleep apnea, on CPAP.  7. GERD.  8. Hypertension, controlled.  9. Rheumatoid arthritis  10. Gout.  11. Depression.  12. LHC (6/10): No angiographic CAD. Mildly dilated LV. 3+ MR. EF 40%.  13. Prior smoking, quit  14. Colitis (2/11)  15. Schizophrenia versus schizoaffective disorder 16. History of right leg DVT    No current facility-administered medications for this encounter.   Current Outpatient Prescriptions  Medication Sig Dispense Refill  . [DISCONTINUED] QUEtiapine (SEROQUEL) 100 MG tablet Take 400 mg by mouth at bedtime.        Facility-Administered Medications  Ordered in Other Encounters  Medication Dose Route Frequency Provider Last Rate Last Dose  . 0.9 %  sodium chloride infusion  250 mL Intravenous PRN Ok Anis, NP      . acetaminophen (TYLENOL) tablet 650 mg  650 mg Oral Q4H PRN Ok Anis, NP      . amitriptyline (ELAVIL) tablet 150 mg  150 mg Oral QHS PRN Ok Anis, NP   150 mg at 09/14/12 2321  . aspirin EC tablet 81 mg  81 mg Oral Daily Ok Anis, NP    81 mg at 09/15/12 4098  . carvedilol (COREG) tablet 25 mg  25 mg Oral BID WC Ok Anis, NP   25 mg at 09/15/12 1191  . furosemide (LASIX) injection 80 mg  80 mg Intravenous Q8H Laurey Morale, MD   80 mg at 09/15/12 1519  . heparin injection 5,000 Units  5,000 Units Subcutaneous Q8H Ok Anis, NP   5,000 Units at 09/15/12 1520  . hydrALAZINE (APRESOLINE) tablet 75 mg  75 mg Oral TID Ok Anis, NP   75 mg at 09/15/12 1520  . HYDROcodone-acetaminophen (NORCO) 7.5-325 MG per tablet 1 tablet  1 tablet Oral Q6H PRN Ok Anis, NP   1 tablet at 09/14/12 2256  . isosorbide mononitrate (IMDUR) 24 hr tablet 90 mg  90 mg Oral Daily Ok Anis, NP   90 mg at 09/15/12 4782  . milrinone (PRIMACOR) infusion 200 mcg/mL (0.2 mg/ml)  0.25 mcg/kg/min Intravenous Continuous Pricilla Riffle, MD      . ondansetron [facility]) injection 4 mg  4 mg Intravenous Q6H PRN Ok Anis, NP      . pantoprazole (PROTONIX) EC tablet 40 mg  40 mg Oral Daily Ok Anis, NP   40 mg at 09/15/12 0941  . sodium chloride 0.9 % injection 3 mL  3 mL Intravenous Q12H Ok Anis, NP   3 mL at 09/14/12 2141  . sodium chloride 0.9 % injection 3 mL  3 mL Intravenous PRN Ok Anis, NP      . spironolactone (ALDACTONE) tablet 25 mg  25 mg Oral Daily Ok Anis, NP   25 mg at 09/15/12 9562     Physical Exam:  Filed Vitals:   09/12/12 1055  BP: 88/48  Pulse: 98  Weight: 384 lb 12 oz (174.521 kg)  SpO2: 97%    General: NAD, obese.  Neck: JVP hard to see does appear slightly  elevated, no thyromegaly or thyroid nodule. No carotid bruit.  Lungs: Clear to auscultation bilaterally with normal respiratory effort. CV: Nondisplaced PMI.  Heart regular S1/S2, no S3/S4, no murmur.  Abdomen: Obese Soft, nontender, + distention.  Neurologic: Alert and oriented x 3.  Psych: Normal affect. Extremities: No clubbing or cyanosis. Normal pedal pulses.  2-3+ lower  extremity edema.

## 2012-09-15 NOTE — Progress Notes (Signed)
Peripherally Inserted Central Catheter/Midline Placement  The IV Nurse has discussed with the patient and/or persons authorized to consent for the patient, the purpose of this procedure and the potential benefits and risks involved with this procedure.  The benefits include less needle sticks, lab draws from the catheter and patient may be discharged home with the catheter.  Risks include, but not limited to, infection, bleeding, blood clot (thrombus formation), and puncture of an artery; nerve damage and irregular heat beat.  Alternatives to this procedure were also discussed.  PICC/Midline Placement Documentation  PICC / Midline Double Lumen 09/15/12 PICC Right Basilic (Active)  Indication for Insertion or Continuance of Line Chronic illness with exacerbations (CF, Sickle Cell, etc.) 09/15/2012  7:07 PM  Length mark (cm) 5 cm 09/15/2012  7:07 PM  Site Assessment Clean;Dry;Intact 09/15/2012  7:07 PM  Lumen #1 Status Flushed;Saline locked 09/15/2012  7:07 PM  Lumen #2 Status Flushed;Saline locked;Blood return noted 09/15/2012  7:07 PM  Dressing Type Transparent 09/15/2012  7:07 PM  Dressing Status Clean;Dry;Intact;Antimicrobial disc in place 09/15/2012  7:07 PM  Line Care Connections checked and tightened 09/15/2012  7:07 PM  Dressing Intervention New dressing 09/15/2012  7:07 PM  Dressing Change Due 09/22/12 09/15/2012  7:07 PM       Geoffery Spruce 09/15/2012, 7:08 PM

## 2012-09-16 LAB — BASIC METABOLIC PANEL
BUN: 56 mg/dL — ABNORMAL HIGH (ref 6–23)
CO2: 32 mEq/L (ref 19–32)
CO2: 32 mEq/L (ref 19–32)
Calcium: 9 mg/dL (ref 8.4–10.5)
Chloride: 89 mEq/L — ABNORMAL LOW (ref 96–112)
Chloride: 91 mEq/L — ABNORMAL LOW (ref 96–112)
Creatinine, Ser: 1.64 mg/dL — ABNORMAL HIGH (ref 0.50–1.10)
GFR calc Af Amer: 41 mL/min — ABNORMAL LOW (ref >90)
GFR calc non Af Amer: 35 mL/min — ABNORMAL LOW (ref >90)
GFR calc non Af Amer: 36 mL/min — ABNORMAL LOW (ref >90)
Glucose, Bld: 207 mg/dL — ABNORMAL HIGH (ref 70–99)
Glucose, Bld: 176 mg/dL — ABNORMAL HIGH (ref 70–99)
Potassium: 3.3 mEq/L — ABNORMAL LOW (ref 3.5–5.1)
Potassium: 3.5 mEq/L (ref 3.5–5.1)
Sodium: 130 mEq/L — ABNORMAL LOW (ref 135–145)
Sodium: 132 mEq/L — ABNORMAL LOW (ref 135–145)

## 2012-09-16 LAB — MRSA PCR SCREENING: MRSA by PCR: NEGATIVE

## 2012-09-16 MED ORDER — POTASSIUM CHLORIDE CRYS ER 20 MEQ PO TBCR
40.0000 meq | EXTENDED_RELEASE_TABLET | Freq: Once | ORAL | Status: AC
  Administered 2012-09-16: 40 meq via ORAL
  Filled 2012-09-16: qty 2

## 2012-09-16 NOTE — Progress Notes (Signed)
Noted per progress note to check Co-ox if milrinone started and transfer to stepdown unit.  Pt. Started on Milrinone @1738 .  Dr. Mayford Knife notified.  Order received to transfer pt. to stepdown.  Report called and pt. transferred to [id number] on monitor and with RN.

## 2012-09-16 NOTE — Progress Notes (Signed)
   SUBJECTIVE:  Breathing OK.  No acute complaints.  No acute SOB   PHYSICAL EXAM Filed Vitals:   09/16/12 0300 09/16/12 0400 09/16/12 0500 09/16/12 0730  BP: 108/56 114/54 110/54 111/73  Pulse: 109 110 110 108  Temp:  98.3 F (36.8 C)  97.8 F (36.6 C)  TempSrc:  Oral  Oral  Resp:    20  Height:      Weight:   382 lb 8 oz (173.5 kg)   SpO2: 96% 96% 96% 98%   General:  No distress Neck:  Unable to assess neck veins Lungs:  Decreased breath sounds but no crackles Heart:  RRR Abdomen:  obese Extremities:  Moderate edema  LABS: Lab Results  Component Value Date   CKTOTAL 87 08/03/2011   CKMB 1.8 08/03/2011   TROPONINI <0.30 08/03/2011   Results for orders placed during the hospital encounter of 09/13/12 (from the past 24 hour(s))  PRO B NATRIURETIC PEPTIDE     Status: None   Collection Time    09/15/12 10:00 AM      Result Value Range   Pro B Natriuretic peptide (BNP) 85.5  0 - 125 pg/mL  BASIC METABOLIC PANEL     Status: Abnormal   Collection Time    09/15/12 10:00 AM      Result Value Range   Sodium 130 (*) 135 - 145 mEq/L   Potassium 4.1  3.5 - 5.1 mEq/L   Chloride 87 (*) 96 - 112 mEq/L   CO2 27  19 - 32 mEq/L   Glucose, Bld 165 (*) 70 - 99 mg/dL   BUN 54 (*) 6 - 23 mg/dL   Creatinine, Ser 8.65 (*) 0.50 - 1.10 mg/dL   Calcium 9.8  8.4 - 78.4 mg/dL   GFR calc non Af Amer 36 (*) >90 mL/min   GFR calc Af Amer 41 (*) >90 mL/min  MRSA PCR SCREENING     Status: None   Collection Time    09/15/12 10:54 PM      Result Value Range   MRSA by PCR NEGATIVE  NEGATIVE  BASIC METABOLIC PANEL     Status: Abnormal   Collection Time    09/16/12  4:00 AM      Result Value Range   Sodium 130 (*) 135 - 145 mEq/L   Potassium 3.3 (*) 3.5 - 5.1 mEq/L   Chloride 89 (*) 96 - 112 mEq/L   CO2 32  19 - 32 mEq/L   Glucose, Bld 207 (*) 70 - 99 mg/dL   BUN 56 (*) 6 - 23 mg/dL   Creatinine, Ser 6.96 (*) 0.50 - 1.10 mg/dL   Calcium 9.0  8.4 - 29.5 mg/dL   GFR calc non Af Amer 35 (*)  >90 mL/min   GFR calc Af Amer 41 (*) >90 mL/min    Intake/Output Summary (Last 24 hours) at 09/16/12 0758 Last data filed at 09/16/12 0700  Gross per 24 hour  Intake 683.77 ml  Output   2050 ml  Net -1366.23 ml    ASSESSMENT AND PLAN:  Acute on chronic systolic HF:  Started on milrinone yesterday. Wt is unchanged.  I/O improved last 24 hours. CVP is 7.  I will increase the milrinone today.   AKD:  Creat is slightly better today.  Continue to follow.   Fayrene Fearing [facility] 09/16/2012 7:58 AM

## 2012-09-17 DIAGNOSIS — I5023 Acute on chronic systolic (congestive) heart failure: Secondary | ICD-10-CM

## 2012-09-17 DIAGNOSIS — N183 Chronic kidney disease, stage 3 unspecified: Secondary | ICD-10-CM

## 2012-09-17 LAB — CARBOXYHEMOGLOBIN
Carboxyhemoglobin: 1.3 % (ref 0.5–1.5)
Methemoglobin: 0.9 % (ref 0.0–1.5)
O2 Saturation: 73.8 %

## 2012-09-17 MED ORDER — ALUM & MAG HYDROXIDE-SIMETH 200-200-20 MG/5ML PO SUSP: 30.0000 mL | Freq: Once | ORAL | Status: DC

## 2012-09-17 MED ORDER — METOLAZONE 2.5 MG PO TABS
2.5000 mg | ORAL_TABLET | Freq: Once | ORAL | Status: DC
  Filled 2012-09-17: qty 1

## 2012-09-17 MED ORDER — BISACODYL 5 MG PO TBEC
10.0000 mg | DELAYED_RELEASE_TABLET | Freq: Every day | ORAL | Status: DC | PRN
Start: 2012-09-17 — End: 2012-09-21
  Administered 2012-09-17 – 2012-09-20 (×3): 10 mg via ORAL
  Filled 2012-09-17 (×3): qty 2

## 2012-09-17 MED ORDER — DOCUSATE SODIUM 100 MG PO CAPS
100.0000 mg | ORAL_CAPSULE | Freq: Two times a day (BID) | ORAL | Status: DC
  Administered 2012-09-17 – 2012-09-21 (×8): 100 mg via ORAL
  Filled 2012-09-17 (×10): qty 1

## 2012-09-17 MED ORDER — POTASSIUM CHLORIDE CRYS ER 20 MEQ PO TBCR
40.0000 meq | EXTENDED_RELEASE_TABLET | Freq: Once | ORAL | Status: AC
  Administered 2012-09-17: 40 meq via ORAL
  Filled 2012-09-17: qty 2

## 2012-09-17 MED ORDER — METOLAZONE 2.5 MG PO TABS
2.5000 mg | ORAL_TABLET | Freq: Once | ORAL | Status: AC
  Administered 2012-09-17: 2.5 mg via ORAL
  Filled 2012-09-17: qty 1

## 2012-09-17 NOTE — Progress Notes (Signed)
After given the patient her meds this morning she became nauseated and threw up all her meds. Md(Dr Mclean) aware. Vitals stable. Patient also c/o of "lump in her chest" and she states "its not going away". Md aware. EKG showed no obvious changes. After thirty minutes the issue resolved and the patient felt the much better. Patient sitting in the chair at this time.   Culver Feighner, Charlaine Dalton

## 2012-09-17 NOTE — Progress Notes (Signed)
Patient Name: [name] Date of Encounter: 09/17/2012    Principal Problem:   Acute on chronic systolic CHF (congestive heart failure) Active Problems:   Nonischemic cardiomyopathy   Acute on chronic kidney disease, stage 3   Morbid obesity   MITRAL REGURGITATION, SEVERE   Hypertension   Depression   Hypokalemia   ICD-St.Jude   SUBJECTIVE  Breathing better. Weight slowly coming down.  Renal fxn stable.  CURRENT MEDS . aspirin EC  81 mg Oral Daily  . carvedilol  25 mg Oral BID WC  . furosemide  80 mg Intravenous Q8H  . heparin  5,000 Units Subcutaneous Q8H  . hydrALAZINE  75 mg Oral TID  . isosorbide mononitrate  90 mg Oral Daily  . pantoprazole  40 mg Oral Daily  . sodium chloride  3 mL Intravenous Q12H  . spironolactone  25 mg Oral Daily  milrinone 0.25  OBJECTIVE  Filed Vitals:   09/17/12 0000 09/17/12 0343 09/17/12 0400 09/17/12 0458  BP: 99/62  125/77   Pulse:      Temp: 98.3 F (36.8 C) 97.9 F (36.6 C)    TempSrc: Oral Oral    Resp: 20 18    Height:      Weight:    375 lb 3.6 oz (170.2 kg)  SpO2: 92% 94%    CVP 6   Intake/Output Summary (Last 24 hours) at 09/17/12 0723 Last data filed at 09/17/12 0600  Gross per 24 hour  Intake 1060.18 ml  Output   1350 ml  Net -289.82 ml   Filed Weights   09/15/12 2236 09/16/12 0500 09/17/12 0458  Weight: 382 lb 15 oz (173.7 kg) 382 lb 8 oz (173.5 kg) 375 lb 3.6 oz (170.2 kg)   PHYSICAL EXAM  General: Pleasant, NAD. Neuro: Alert and oriented X 3. Moves all extremities spontaneously. Psych: Normal affect. HEENT:  Normal  Neck: Supple without bruits.  Obese, difficult to assess jvp. Lungs:  Resp regular and unlabored, diminished bilat. Heart: RRR, tachy. Abdomen: Soft, non-tender, non-distended, BS + x 4.  Extremities: No clubbing, cyanosis.  1+ bilat LE edema. DP/PT/Radials 2+ and equal bilaterally.  Accessory Clinical Findings  CBC  Recent Labs  09/14/12 1023  WBC 5.1  HGB 10.5*  HCT  32.8*  MCV 74.0*  PLT 330   Basic Metabolic Panel  Recent Labs  09/16/12 0400 09/16/12 0830  NA 130* 132*  K 3.3* 3.5  CL 89* 91*  CO2 32 32  GLUCOSE 207* 176*  BUN 56* 54*  CREATININE 1.64* 1.62*  CALCIUM 9.0 9.6   TELE  Sinus rhythm/sinus tach - hovering around 100.  Radiology/Studies  Dg Chest 2 View  09/14/2012  *RADIOLOGY REPORT*  Clinical Data: Congestive heart failure  CHEST - 2 VIEW  Comparison: August 10, 2012.  Findings: Stable mild cardiomegaly.  No change in position of left- sided defibrillator.  No pneumothorax or pleural effusion is noted. Left lung is clear.  Mild airspace and linear opacities are noted in the right lung which are unchanged compared to prior exam.  IMPRESSION: No change involving right lung opacities compared to prior exam which may be chronic.  No definite evidence of acute cardiopulmonary abnormality seen.   Original Report Authenticated By: Lupita Raider.,  M.D.    ASSESSMENT AND PLAN  1.  Acute on chronic systolic CHF/NICM:  Pt remains on milrinone and creatinine is stable.  CVP is 6 this AM.  Net negative of only ~ 300 yesterday.  Weight down ~ 7lbs since admission.  She still has some LEE.  Breathing/orthopnea has improved.  Will give a one time dose of metolazone this AM prior to scheduled lasix.  Will check a co-ox this AM.  If nl, will likely plan to wean off milrinone and look to convert lasix back to oral dose of torsemide, which she was on at home.  Cont bb, hydral/nitrate.  ARB remains on hold in setting of fluctuating renal fxn.  2.  Acute on chronic stage III kidney dzs:  Renal fxn stable on milrinone.  ARB remains on hold.  3.  Hypokalemia:  supp.  Signed, Nicolasa Ducking NP  Patient seen with NP, agree with the above note.  She has not lost much weight compared to what she gained at home.  Exam difficult.  CVP has not been particularly elevated.  When she gets back in bed, will have nurse measure a carefully leveled CVP to see  where we are.  Creatinine is stable.  I am going to give her a dose of metolazone today with her Lasix to try to get a bit more fluid off her.  Possible transition back to po meds tomorrow.   [name] 09/17/2012 7:57 AM

## 2012-09-17 NOTE — Progress Notes (Signed)
Inpatient Diabetes Program Recommendations  AACE/ADA: New Consensus Statement on Inpatient Glycemic Control (2013)  Target Ranges:  Prepandial:   less than 140 mg/dL      Peak postprandial:   less than 180 mg/dL (1-2 hours)      Critically ill patients:  140 - 180 mg/dL  Results for FARHA, DANO (MRN [id number]) as of 09/17/2012 11:36  Ref. Range 09/14/2012 10:23 09/15/2012 04:25 09/15/2012 10:00 09/16/2012 04:00 09/16/2012 08:30  Glucose Latest Range: 70-99 mg/dL 086 (H) 578 (H) 469 (H) 207 (H) 176 (H)   Inpatient Diabetes Program Recommendations Correction (SSI): Start sensitive correction TID HgbA1C: check A1C to assess prehospital glucose control  Thank you  Piedad Climes BSN, RN,CDE Inpatient Diabetes Coordinator [phone] (team pager)

## 2012-09-18 ENCOUNTER — Encounter (HOSPITAL_COMMUNITY): Payer: Medicare Other

## 2012-09-18 DIAGNOSIS — N289 Disorder of kidney and ureter, unspecified: Secondary | ICD-10-CM

## 2012-09-18 LAB — BASIC METABOLIC PANEL
BUN: 44 mg/dL — ABNORMAL HIGH (ref 6–23)
CO2: 32 mEq/L (ref 19–32)
Calcium: 9.5 mg/dL (ref 8.4–10.5)
Chloride: 89 mEq/L — ABNORMAL LOW (ref 96–112)
Creatinine, Ser: 2.53 mg/dL — ABNORMAL HIGH (ref 0.50–1.10)
GFR calc Af Amer: 26 mL/min — ABNORMAL LOW (ref >90)
GFR calc Af Amer: 24 mL/min — ABNORMAL LOW (ref >90)
GFR calc non Af Amer: 22 mL/min — ABNORMAL LOW (ref >90)
Potassium: 3.6 mEq/L (ref 3.5–5.1)
Sodium: 131 mEq/L — ABNORMAL LOW (ref 135–145)
Sodium: 129 mEq/L — ABNORMAL LOW (ref 135–145)

## 2012-09-18 LAB — CARBOXYHEMOGLOBIN
Carboxyhemoglobin: 1.1 % (ref 0.5–1.5)
Methemoglobin: 1.2 % (ref 0.0–1.5)
O2 Saturation: 80.5 %
Total hemoglobin: 9.8 g/dL — ABNORMAL LOW (ref 12.0–16.0)

## 2012-09-18 MED ORDER — HYDRALAZINE HCL 25 MG PO TABS
37.5000 mg | ORAL_TABLET | Freq: Three times a day (TID) | ORAL | Status: DC
  Administered 2012-09-18 – 2012-09-19 (×5): 37.5 mg via ORAL
  Filled 2012-09-18 (×10): qty 1.5

## 2012-09-18 MED ORDER — POTASSIUM CHLORIDE CRYS ER 20 MEQ PO TBCR
20.0000 meq | EXTENDED_RELEASE_TABLET | Freq: Once | ORAL | Status: AC
  Administered 2012-09-18: 20 meq via ORAL
  Filled 2012-09-18: qty 1

## 2012-09-18 MED ORDER — CARVEDILOL 12.5 MG PO TABS
12.5000 mg | ORAL_TABLET | Freq: Two times a day (BID) | ORAL | Status: DC
  Administered 2012-09-18 – 2012-09-20 (×5): 12.5 mg via ORAL
  Filled 2012-09-18 (×9): qty 1

## 2012-09-18 NOTE — Care Management Note (Addendum)
    Page 1 of 2   09/21/2012     9:02:42 AM   CARE MANAGEMENT NOTE 09/21/2012  Patient:  [name], [name]   Account Number:  [id number]  Date Initiated:  09/17/2012  Documentation initiated by:  Junius Creamer  Subjective/Objective Assessment:   adm w chf     Action/Plan:   lives w fam, pcp dr Clydie Braun schooler   Anticipated DC Date:  09/21/2012   Anticipated DC Plan:  HOME W HOME HEALTH SERVICES      DC Planning Services  CM consult      [facility] Choice  Resumption Of Svcs/PTA Provider  HOME HEALTH   Choice offered to / List presented to:  C-1 Patient        HH arranged  HH-10 DISEASE MANAGEMENT  HH-1 RN      [facility] agency  [facility]  [facility]   Status of service:   Medicare Important Message given?   (If response is "NO", the following Medicare IM given date fields will be blank) Date Medicare IM given:   Date Additional Medicare IM given:    Discharge Disposition:  HOME W HOME HEALTH SERVICES  Per UR Regulation:  Reviewed for med. necessity/level of care/duration of stay  If discussed at Long Length of Stay Meetings, dates discussed:   09/20/2012    Comments:  5/9  0900 debbie Ahlam Piscitelli rn,bsn spoke w pt. she get medlink nse 1x per month. feels hhrn coming a little more freq may be helpful. no pref to hhc agencies. ref to donna w [facility] for hhrn. pt has walker at home. lives w [employer] trader.  5/6  1556 debbie Aneesah Hernan rn,bsn spoke w [employer] w thn(medlink) pt was act w nse w them pta. they will cont to follow after disch.  5/5 0815 debbie Haidyn Chadderdon rn,bsn

## 2012-09-18 NOTE — Progress Notes (Signed)
Patient ID: [name], female   DOB: July 18, 1960, 52 y.o.   MRN: [id number]   SUBJECTIVE: Poor urine output yesterday, creatinine higher this morning.  SBP 90s-100s this morning.  No dyspnea.  Had some nausea yesterday but no more.  CVP is 8.    Marland Kitchen alum & mag hydroxide-simeth  30 mL Oral Once  . aspirin EC  81 mg Oral Daily  . carvedilol  25 mg Oral BID WC  . docusate sodium  100 mg Oral BID  . heparin  5,000 Units Subcutaneous Q8H  . hydrALAZINE  37.5 mg Oral TID  . isosorbide mononitrate  90 mg Oral Daily  . pantoprazole  40 mg Oral Daily  . potassium chloride  20 mEq Oral Once  . sodium chloride  3 mL Intravenous Q12H  . spironolactone  25 mg Oral Daily      Filed Vitals:   09/17/12 2000 09/18/12 0000 09/18/12 0400 09/18/12 0500  BP: 94/47 99/52 114/55   Pulse:      Temp:  97.4 F (36.3 C) 98.7 F (37.1 C)   TempSrc:  Oral Oral   Resp:      Height:      Weight:    376 lb 1.7 oz (170.6 kg)  SpO2: 93% 95% 94%     Intake/Output Summary (Last 24 hours) at 09/18/12 0709 Last data filed at 09/18/12 0600  Gross per 24 hour  Intake    720 ml  Output    750 ml  Net    -30 ml    LABS: Basic Metabolic Panel:  Recent Labs  14/78/29 0830 09/18/12 0530  NA 132* 131*  K 3.5 3.6  CL 91* 92*  CO2 32 28  GLUCOSE 176* 212*  BUN 54* 44*  CREATININE 1.62* 2.39*  CALCIUM 9.6 9.0   Liver Function Tests: No results found for this basename: AST, ALT, ALKPHOS, BILITOT, PROT, ALBUMIN,  in the last 72 hours No results found for this basename: LIPASE, AMYLASE,  in the last 72 hours CBC: No results found for this basename: WBC, NEUTROABS, HGB, HCT, MCV, PLT,  in the last 72 hours Cardiac Enzymes: No results found for this basename: CKTOTAL, CKMB, CKMBINDEX, TROPONINI,  in the last 72 hours BNP: No components found with this basename: POCBNP,  D-Dimer: No results found for this basename: DDIMER,  in the last 72 hours Hemoglobin A1C: No results found for this basename:  HGBA1C,  in the last 72 hours Fasting Lipid Panel: No results found for this basename: CHOL, HDL, LDLCALC, TRIG, CHOLHDL, LDLDIRECT,  in the last 72 hours Thyroid Function Tests: No results found for this basename: TSH, T4TOTAL, FREET3, T3FREE, THYROIDAB,  in the last 72 hours Anemia Panel: No results found for this basename: VITAMINB12, FOLATE, FERRITIN, TIBC, IRON, RETICCTPCT,  in the last 72 hours  RADIOLOGY: No results found.  PHYSICAL EXAM General: NAD, obese Neck: JVP 8-9 cm, no thyromegaly or thyroid nodule.  Lungs: Clear to auscultation bilaterally with normal respiratory effort. CV: Nondisplaced PMI.  Heart regular S1/S2, no S3/S4, no murmur.  1+ edema 1/2 to knees bilaterally.  No carotid bruit.  Normal pedal pulses.  Abdomen: Soft, nontender, no hepatosplenomegaly, no distention.  Neurologic: Alert and oriented x 3.  Psych: Normal affect. Extremities: No clubbing or cyanosis.   TELEMETRY: Sinus tachy in 100s  ASSESSMENT AND PLAN: 52 yo with history of mitral valve repair and nonischemic cardiomyopathy presented with acute on chronic systolic CHF. 1. Acute/chronic systolic CHF: Some  weight loss in hospital but not marked.  CVP now 8.  Creatinine rising, poor urine output yesterday.  Co-ox 61% this morning.  - Hold diuretics today. - Increase milrinone back to 0.375 to promote better cardiac output in the setting of worsening renal function.  Repeat Co-ox later this morning.  - Cut hydralazine and Coreg in half to promote higher BP in the setting of worsening renal function.  2. AKI: Suspect cardiorenal in the setting of diuresis.  As above, holding diuretics today, increasing milrinone to 0.375 and cutting back on hydralazine and Coreg.     [name] 09/18/2012 7:09 AM

## 2012-09-18 NOTE — Progress Notes (Signed)
Nursing 0900 RN informed at change of shift report that patient had minimal UOP overnight and there was MD order to in and out cath patient due to urine seen on bladder scan this AM.  RN went in at 0900 to in and out cath patient and patient was about to ambulate to BR and voided 400cc.  In and out cath not performed at this time due to patient's ability to void.  Will continue to assess for spontaneous voids.

## 2012-09-18 NOTE — Progress Notes (Signed)
Spoke with patient at bedside. She is active with [facility] Care Management services. [facility] Care Coordinator has been trying to work with patient regarding medication and disease management. Made patient aware [facility] Care Management will continue to follow. She could also benefit from home health after hospital discharge. Will continue to follow while inpatient.  [name], MSN- Ed, RN,BSN- [facility] [phone]

## 2012-09-19 LAB — CARBOXYHEMOGLOBIN
Methemoglobin: 1.5 % (ref 0.0–1.5)
Total hemoglobin: 9.3 g/dL — ABNORMAL LOW (ref 12.0–16.0)

## 2012-09-19 LAB — BASIC METABOLIC PANEL
CO2: 31 mEq/L (ref 19–32)
Chloride: 88 mEq/L — ABNORMAL LOW (ref 96–112)
Glucose, Bld: 220 mg/dL — ABNORMAL HIGH (ref 70–99)
Sodium: 127 mEq/L — ABNORMAL LOW (ref 135–145)

## 2012-09-19 MED ORDER — POLYETHYLENE GLYCOL 3350 17 G PO PACK
17.0000 g | PACK | Freq: Every day | ORAL | Status: DC | PRN
  Administered 2012-09-19: 17 g via ORAL
  Filled 2012-09-19: qty 1

## 2012-09-19 NOTE — Progress Notes (Signed)
SUBJECTIVE:  Breathing OK.  No acute complaints.  No acute SOB.     PHYSICAL EXAM Filed Vitals:   09/18/12 2320 09/19/12 0000 09/19/12 0400 09/19/12 0757  BP:  101/50 112/52 110/65  Pulse:    109  Temp: 98.3 F (36.8 C)  98.1 F (36.7 C) 98 F (36.7 C)  TempSrc: Oral  Oral Oral  Resp:      Height:      Weight:   389 lb 1.8 oz (176.5 kg)   SpO2:  95% 93% 95%   General:  No distress Neck:  Unable to assess neck veins Lungs:  Decreased breath sounds but no crackles Heart:  RRR Abdomen:  obese Extremities:  Moderate edema Neuro:  Nonfocal  LABS:  Results for orders placed during the hospital encounter of 09/13/12 (from the past 24 hour(s))  CARBOXYHEMOGLOBIN     Status: Abnormal   Collection Time    09/18/12 11:07 AM      Result Value Range   Total hemoglobin 9.8 (*) 12.0 - 16.0 g/dL   O2 Saturation 45.4     Carboxyhemoglobin 1.6 (*) 0.5 - 1.5 %   Methemoglobin 1.2  0.0 - 1.5 %  BASIC METABOLIC PANEL     Status: Abnormal   Collection Time    09/18/12  4:00 PM      Result Value Range   Sodium 129 (*) 135 - 145 mEq/L   Potassium 3.7  3.5 - 5.1 mEq/L   Chloride 89 (*) 96 - 112 mEq/L   CO2 32  19 - 32 mEq/L   Glucose, Bld 167 (*) 70 - 99 mg/dL   BUN 45 (*) 6 - 23 mg/dL   Creatinine, Ser 0.98 (*) 0.50 - 1.10 mg/dL   Calcium 9.5  8.4 - 11.9 mg/dL   GFR calc non Af Amer 21 (*) >90 mL/min   GFR calc Af Amer 24 (*) >90 mL/min  BASIC METABOLIC PANEL     Status: Abnormal   Collection Time    09/19/12  5:00 AM      Result Value Range   Sodium 127 (*) 135 - 145 mEq/L   Potassium 3.3 (*) 3.5 - 5.1 mEq/L   Chloride 88 (*) 96 - 112 mEq/L   CO2 31  19 - 32 mEq/L   Glucose, Bld 220 (*) 70 - 99 mg/dL   BUN 42 (*) 6 - 23 mg/dL   Creatinine, Ser 1.47 (*) 0.50 - 1.10 mg/dL   Calcium 8.8  8.4 - 82.9 mg/dL   GFR calc non Af Amer 24 (*) >90 mL/min   GFR calc Af Amer 28 (*) >90 mL/min  CARBOXYHEMOGLOBIN     Status: Abnormal   Collection Time    09/19/12  5:01 AM   Result Value Range   Total hemoglobin 9.3 (*) 12.0 - 16.0 g/dL   O2 Saturation 56.2     Carboxyhemoglobin 1.7 (*) 0.5 - 1.5 %   Methemoglobin 1.5  0.0 - 1.5 %    Intake/Output Summary (Last 24 hours) at 09/19/12 0906 Last data filed at 09/19/12 0700  Gross per 24 hour  Intake   1259 ml  Output    900 ml  Net    359 ml    ASSESSMENT AND PLAN:  Acute on chronic systolic HF:  CoOx is improved.  However, sodium is lower.  Creat about the same.  Milrinone was increased yesterday and Coreg, hydralazine reduced. Weight is inaccurate. I discussed the case with Dr.  McLean.  It is very difficult to try to assess this patients volume status.  Exam, CVP and weights give conflicting are conflicting.  Today we will continue Milrinone and plan a right heart cath in the am.    AKD:  Creat is slightly better today.  Continue to follow.  As above.    Fayrene Fearing [facility] 09/19/2012 9:06 AM

## 2012-09-19 NOTE — Progress Notes (Signed)
Report from Night RN. Chart reviewed together. Handoff complete.Introductions complete. Will continue to monitor and advise attending as needed.   

## 2012-09-20 ENCOUNTER — Encounter (HOSPITAL_COMMUNITY)
Admission: AD | Disposition: A | Discharge status: Home Health | Payer: Self-pay | Source: Ambulatory Visit | Attending: Cardiology

## 2012-09-20 DIAGNOSIS — N179 Acute kidney failure, unspecified: Secondary | ICD-10-CM

## 2012-09-20 DIAGNOSIS — I509 Heart failure, unspecified: Secondary | ICD-10-CM

## 2012-09-20 HISTORY — PX: RIGHT HEART CATHETERIZATION: SHX5447

## 2012-09-20 LAB — CBC
HCT: 29.3 % — ABNORMAL LOW (ref 36.0–46.0)
MCH: 24 pg — ABNORMAL LOW (ref 26.0–34.0)
MCHC: 32.1 g/dL (ref 30.0–36.0)
MCV: 74.7 fL — ABNORMAL LOW (ref 78.0–100.0)
RDW: 17.2 % — ABNORMAL HIGH (ref 11.5–15.5)

## 2012-09-20 LAB — POCT I-STAT 3, VENOUS BLOOD GAS (G3P V)
Acid-Base Excess: 4 mmol/L — ABNORMAL HIGH (ref 0.0–2.0)
Bicarbonate: 30.3 mEq/L — ABNORMAL HIGH (ref 20.0–24.0)
Bicarbonate: 30.8 mEq/L — ABNORMAL HIGH (ref 20.0–24.0)
O2 Saturation: 46 %
O2 Saturation: 57 %
TCO2: 32 mmol/L (ref 0–100)
pCO2, Ven: 48.7 mmHg (ref 45.0–50.0)
pCO2, Ven: 49.7 mmHg (ref 45.0–50.0)
pO2, Ven: 26 mmHg — CL (ref 30.0–45.0)

## 2012-09-20 LAB — BASIC METABOLIC PANEL
BUN: 34 mg/dL — ABNORMAL HIGH (ref 6–23)
CO2: 32 mEq/L (ref 19–32)
Glucose, Bld: 141 mg/dL — ABNORMAL HIGH (ref 70–99)
Potassium: 4 mEq/L (ref 3.5–5.1)
Sodium: 131 mEq/L — ABNORMAL LOW (ref 135–145)

## 2012-09-20 LAB — PROTIME-INR: INR: 1.09 (ref 0.00–1.49)

## 2012-09-20 SURGERY — RIGHT HEART CATH
Anesthesia: LOCAL

## 2012-09-20 MED ORDER — LIDOCAINE HCL (PF) 1 % IJ SOLN
INTRAMUSCULAR | Status: AC
  Filled 2012-09-20: qty 30

## 2012-09-20 MED ORDER — MIDAZOLAM HCL 2 MG/2ML IJ SOLN
INTRAMUSCULAR | Status: AC
  Filled 2012-09-20: qty 2

## 2012-09-20 MED ORDER — ONDANSETRON HCL 4 MG/2ML IJ SOLN: 4.0000 mg | Freq: Four times a day (QID) | INTRAMUSCULAR | Status: DC | PRN

## 2012-09-20 MED ORDER — HEPARIN (PORCINE) IN NACL 2-0.9 UNIT/ML-% IJ SOLN
INTRAMUSCULAR | Status: AC
  Filled 2012-09-20: qty 500

## 2012-09-20 MED ORDER — FENTANYL CITRATE 0.05 MG/ML IJ SOLN
INTRAMUSCULAR | Status: AC
  Filled 2012-09-20: qty 2

## 2012-09-20 MED ORDER — HYDRALAZINE HCL 50 MG PO TABS
50.0000 mg | ORAL_TABLET | Freq: Three times a day (TID) | ORAL | Status: DC
  Administered 2012-09-20: 50 mg via ORAL
  Filled 2012-09-20 (×3): qty 1

## 2012-09-20 MED ORDER — HEPARIN SODIUM (PORCINE) 5000 UNIT/ML IJ SOLN
5000.0000 [IU] | Freq: Three times a day (TID) | INTRAMUSCULAR | Status: DC
  Administered 2012-09-20 – 2012-09-21 (×2): 5000 [IU] via SUBCUTANEOUS
  Filled 2012-09-20 (×5): qty 1

## 2012-09-20 MED ORDER — ACETAMINOPHEN 325 MG PO TABS
650.0000 mg | ORAL_TABLET | ORAL | Status: DC | PRN
  Administered 2012-09-21: 650 mg via ORAL

## 2012-09-20 MED ORDER — HYDRALAZINE HCL 50 MG PO TABS
75.0000 mg | ORAL_TABLET | Freq: Three times a day (TID) | ORAL | Status: DC
  Administered 2012-09-20 – 2012-09-21 (×3): 75 mg via ORAL
  Filled 2012-09-20 (×5): qty 1

## 2012-09-20 NOTE — H&P (View-Only) (Signed)
Patient ID: [name], female   DOB: 07/22/1960, 52 y.o.   MRN: [id number]  SUBJECTIVE: Creatinine better today, down to 1.47.  No dyspnea.  Had some nausea yesterday but no more.  CVP is 8 still.    . alum & mag hydroxide-simeth  30 mL Oral Once  . aspirin EC  81 mg Oral Daily  . carvedilol  12.5 mg Oral BID WC  . docusate sodium  100 mg Oral BID  . heparin  5,000 Units Subcutaneous Q8H  . hydrALAZINE  50 mg Oral TID  . isosorbide mononitrate  90 mg Oral Daily  . pantoprazole  40 mg Oral Daily  . sodium chloride  3 mL Intravenous Q12H  . spironolactone  25 mg Oral Daily  milrinone gtt @ 0.375   Filed Vitals:   09/19/12 1957 09/19/12 2126 09/19/12 2309 09/20/12 0331  BP:  109/68 100/63 141/69  Pulse:   108 102  Temp: 98.8 F (37.1 C)  98.4 F (36.9 C) 98.1 F (36.7 C)  TempSrc: Oral  Oral Oral  Resp:   18 16  Height:      Weight:    381 lb 9.9 oz (173.1 kg)  SpO2: 95%  98% 97%    Intake/Output Summary (Last 24 hours) at 09/20/12 0721 Last data filed at 09/20/12 0600  Gross per 24 hour  Intake  923.5 ml  Output   1750 ml  Net -826.5 ml    LABS: Basic Metabolic Panel:  Recent Labs  09/19/12 0500 09/20/12 0320  NA 127* 131*  K 3.3* 4.0  CL 88* 92*  CO2 31 32  GLUCOSE 220* 141*  BUN 42* 34*  CREATININE 2.24* 1.47*  CALCIUM 8.8 9.1   Liver Function Tests: No results found for this basename: AST, ALT, ALKPHOS, BILITOT, PROT, ALBUMIN,  in the last 72 hours No results found for this basename: LIPASE, AMYLASE,  in the last 72 hours CBC: No results found for this basename: WBC, NEUTROABS, HGB, HCT, MCV, PLT,  in the last 72 hours Cardiac Enzymes: No results found for this basename: CKTOTAL, CKMB, CKMBINDEX, TROPONINI,  in the last 72 hours BNP: No components found with this basename: POCBNP,  D-Dimer: No results found for this basename: DDIMER,  in the last 72 hours Hemoglobin A1C: No results found for this basename: HGBA1C,  in the last 72  hours Fasting Lipid Panel: No results found for this basename: CHOL, HDL, LDLCALC, TRIG, CHOLHDL, LDLDIRECT,  in the last 72 hours Thyroid Function Tests: No results found for this basename: TSH, T4TOTAL, FREET3, T3FREE, THYROIDAB,  in the last 72 hours Anemia Panel: No results found for this basename: VITAMINB12, FOLATE, FERRITIN, TIBC, IRON, RETICCTPCT,  in the last 72 hours  RADIOLOGY: No results found.  PHYSICAL EXAM General: NAD, obese Neck: JVP 8-9 cm, no thyromegaly or thyroid nodule.  Lungs: Clear to auscultation bilaterally with normal respiratory effort. CV: Nondisplaced PMI.  Heart regular S1/S2, no S3/S4, no murmur.  1+ edema 1/2 to knees bilaterally.  No carotid bruit.  Normal pedal pulses.  Abdomen: Soft, nontender, no hepatosplenomegaly, no distention.  Neurologic: Alert and oriented x 3.  Psych: Normal affect. Extremities: No clubbing or cyanosis.   TELEMETRY: Sinus tachy in 100s  ASSESSMENT AND PLAN: 52 yo with history of mitral valve repair and nonischemic cardiomyopathy presented with acute on chronic systolic CHF. 1. Acute/chronic systolic CHF: Some weight loss in hospital but not marked.  CVP remains at 8.  Creatinine is better today.    Volume is difficult to discern: weight is up a lot from baseline but her CVP has not been elevated.  - Decrease milrinone to 0.25 - Increase hydralazine to 50 mg tid - RHC today to assess filling pressures.  2. AKI: Suspect cardiorenal in the setting of diuresis.  Renal function better this morning.     [name] 09/20/2012 7:21 AM  

## 2012-09-20 NOTE — Interval H&P Note (Signed)
History and Physical Interval Note:  09/20/2012 12:49 PM  [name]  has presented today for surgery, with the diagnosis of cp  The various methods of treatment have been discussed with the patient and family. After consideration of risks, benefits and other options for treatment, the patient has consented to  Procedure(s): RIGHT HEART CATH (N/A) as a surgical intervention .  The patient's history has been reviewed, patient examined, no change in status, stable for surgery.  I have reviewed the patient's chart and labs.  Questions were answered to the patient's satisfaction.     Dalton [employer]

## 2012-09-20 NOTE — Progress Notes (Signed)
Patient ID: [name], female   DOB: 1960/09/02, 52 y.o.   MRN: [id number]  SUBJECTIVE: Creatinine better today, down to 1.47.  No dyspnea.  Had some nausea yesterday but no more.  CVP is 8 still.    Marland Kitchen alum & mag hydroxide-simeth  30 mL Oral Once  . aspirin EC  81 mg Oral Daily  . carvedilol  12.5 mg Oral BID WC  . docusate sodium  100 mg Oral BID  . heparin  5,000 Units Subcutaneous Q8H  . hydrALAZINE  50 mg Oral TID  . isosorbide mononitrate  90 mg Oral Daily  . pantoprazole  40 mg Oral Daily  . sodium chloride  3 mL Intravenous Q12H  . spironolactone  25 mg Oral Daily  milrinone gtt @ 0.375   Filed Vitals:   09/19/12 1957 09/19/12 2126 09/19/12 2309 09/20/12 0331  BP:  109/68 100/63 141/69  Pulse:   108 102  Temp: 98.8 F (37.1 C)  98.4 F (36.9 C) 98.1 F (36.7 C)  TempSrc: Oral  Oral Oral  Resp:   18 16  Height:      Weight:    381 lb 9.9 oz (173.1 kg)  SpO2: 95%  98% 97%    Intake/Output Summary (Last 24 hours) at 09/20/12 0721 Last data filed at 09/20/12 0600  Gross per 24 hour  Intake  923.5 ml  Output   1750 ml  Net -826.5 ml    LABS: Basic Metabolic Panel:  Recent Labs  11/91/47 0500 09/20/12 0320  NA 127* 131*  K 3.3* 4.0  CL 88* 92*  CO2 31 32  GLUCOSE 220* 141*  BUN 42* 34*  CREATININE 2.24* 1.47*  CALCIUM 8.8 9.1   Liver Function Tests: No results found for this basename: AST, ALT, ALKPHOS, BILITOT, PROT, ALBUMIN,  in the last 72 hours No results found for this basename: LIPASE, AMYLASE,  in the last 72 hours CBC: No results found for this basename: WBC, NEUTROABS, HGB, HCT, MCV, PLT,  in the last 72 hours Cardiac Enzymes: No results found for this basename: CKTOTAL, CKMB, CKMBINDEX, TROPONINI,  in the last 72 hours BNP: No components found with this basename: POCBNP,  D-Dimer: No results found for this basename: DDIMER,  in the last 72 hours Hemoglobin A1C: No results found for this basename: HGBA1C,  in the last 72  hours Fasting Lipid Panel: No results found for this basename: CHOL, HDL, LDLCALC, TRIG, CHOLHDL, LDLDIRECT,  in the last 72 hours Thyroid Function Tests: No results found for this basename: TSH, T4TOTAL, FREET3, T3FREE, THYROIDAB,  in the last 72 hours Anemia Panel: No results found for this basename: VITAMINB12, FOLATE, FERRITIN, TIBC, IRON, RETICCTPCT,  in the last 72 hours  RADIOLOGY: No results found.  PHYSICAL EXAM General: NAD, obese Neck: JVP 8-9 cm, no thyromegaly or thyroid nodule.  Lungs: Clear to auscultation bilaterally with normal respiratory effort. CV: Nondisplaced PMI.  Heart regular S1/S2, no S3/S4, no murmur.  1+ edema 1/2 to knees bilaterally.  No carotid bruit.  Normal pedal pulses.  Abdomen: Soft, nontender, no hepatosplenomegaly, no distention.  Neurologic: Alert and oriented x 3.  Psych: Normal affect. Extremities: No clubbing or cyanosis.   TELEMETRY: Sinus tachy in 100s  ASSESSMENT AND PLAN: 52 yo with history of mitral valve repair and nonischemic cardiomyopathy presented with acute on chronic systolic CHF. 1. Acute/chronic systolic CHF: Some weight loss in hospital but not marked.  CVP remains at 8.  Creatinine is better today.  Volume is difficult to discern: weight is up a lot from baseline but her CVP has not been elevated.  - Decrease milrinone to 0.25 - Increase hydralazine to 50 mg tid - RHC today to assess filling pressures.  2. AKI: Suspect cardiorenal in the setting of diuresis.  Renal function better this morning.     [name] 09/20/2012 7:21 AM

## 2012-09-20 NOTE — CV Procedure (Signed)
   Cardiac Catheterization Procedure Note  Name: KAMEELA LEIPOLD MRN: [id number] DOB: [birth year]  Procedure: Right Heart Cath  Indication: CHF, ? Elevated filling pressure   Procedural Details: The right groin was prepped, draped, and anesthetized with 1% lidocaine. Using the modified Seldinger technique a 7 French sheath was placed in the right femoral vein. A Swan-Ganz catheter was used for the right heart catheterization. Standard protocol was followed for recording of right heart pressures and sampling of oxygen saturations. Fick cardiac output was calculated. There were no immediate procedural complications. The patient was transferred to the post catheterization recovery area for further monitoring.  Procedural Findings: Hemodynamics (mmHg) RA mean 10 RV 28/5 PA 23/10, mean 15 PCWP mean 17  Oxygen saturations: PA 57% AO 100%  Cardiac Output (Fick) 5.7  Cardiac Index (Fick) 2.17   Final Conclusions:  Filling pressures appear to be optimized.  Cardiac index is low but not markedly so.  Will try to titrate down on milrinone and up on hydralazine.  Will restart po diuretics tomorrow.   [name] 09/20/2012, 2:56 PM

## 2012-09-21 ENCOUNTER — Encounter (HOSPITAL_COMMUNITY): Payer: Self-pay | Admitting: Physician Assistant

## 2012-09-21 LAB — BASIC METABOLIC PANEL
BUN: 29 mg/dL — ABNORMAL HIGH (ref 6–23)
Creatinine, Ser: 1.5 mg/dL — ABNORMAL HIGH (ref 0.50–1.10)
GFR calc Af Amer: 45 mL/min — ABNORMAL LOW (ref >90)
GFR calc non Af Amer: 39 mL/min — ABNORMAL LOW (ref >90)
Glucose, Bld: 132 mg/dL — ABNORMAL HIGH (ref 70–99)

## 2012-09-21 MED ORDER — FERROUS SULFATE 325 (65 FE) MG PO TABS: 325.0000 mg | ORAL_TABLET | Freq: Two times a day (BID) | ORAL | Status: DC

## 2012-09-21 MED ORDER — SENNA 8.6 MG PO TABS
1.0000 | ORAL_TABLET | Freq: Every day | ORAL | Status: DC | PRN
  Filled 2012-09-21: qty 1

## 2012-09-21 MED ORDER — CARVEDILOL 6.25 MG PO TABS
18.7500 mg | ORAL_TABLET | Freq: Two times a day (BID) | ORAL | Status: DC
  Administered 2012-09-21: 18.75 mg via ORAL
  Filled 2012-09-21 (×4): qty 1

## 2012-09-21 MED ORDER — TORSEMIDE 100 MG PO TABS
100.0000 mg | ORAL_TABLET | Freq: Every day | ORAL | Status: DC
Start: 2012-09-21 — End: 2012-09-21
  Administered 2012-09-21: 100 mg via ORAL
  Filled 2012-09-21: qty 1

## 2012-09-21 MED ORDER — CARVEDILOL 12.5 MG PO TABS: 18.7500 mg | ORAL_TABLET | Freq: Two times a day (BID) | ORAL | Status: DC

## 2012-09-21 MED ORDER — FERROUS SULFATE 325 (65 FE) MG PO TABS
325.0000 mg | ORAL_TABLET | Freq: Two times a day (BID) | ORAL | Status: DC
  Administered 2012-09-21: 325 mg via ORAL
  Filled 2012-09-21 (×3): qty 1

## 2012-09-21 MED ORDER — FERROUS SULFATE 325 (65 FE) MG PO TABS
325.0000 mg | ORAL_TABLET | Freq: Two times a day (BID) | ORAL | Status: DC
  Filled 2012-09-21 (×3): qty 1

## 2012-09-21 MED ORDER — POTASSIUM CHLORIDE CRYS ER 20 MEQ PO TBCR: 40.0000 meq | EXTENDED_RELEASE_TABLET | Freq: Every day | ORAL | Status: DC

## 2012-09-21 NOTE — Progress Notes (Signed)
Patient: [name] / Admit Date: 09/13/2012 / Date of Encounter: 09/21/2012, 6:57 AM   Subjective  [name] still with some swelling. Breathing is stable. Slept on 2 pillows last night.   Objective   Telemetry: NSR/sinus tach Physical Exam: Filed Vitals:   09/21/12 0400  BP: 118/66  Pulse: 94  Temp: 98 F (36.7 C)  Resp: 26   General: Well developed morbidly obese AAF in no acute distress. Head: Normocephalic, atraumatic, sclera non-icteric, no xanthomas, nares are without discharge. Neck: Negative for carotid bruits. JVD not elevated. Lungs: Distant breath sounds. Breathing is unlabored. Heart: RRR S1 S2 without murmurs, rubs, or gallops.  Abdomen: Soft, non-tender, non-distended with normoactive bowel sounds. No hepatomegaly. No rebound/guarding. No obvious abdominal masses. Msk:  Strength and tone appear normal for age. Extremities: No clubbing or cyanosis. 1+ bilat LE edema.  Distal pedal pulses are 2+ and equal bilaterally. Neuro: Alert and oriented X 3. Moves all extremities spontaneously. Psych:  Responds to questions appropriately with a normal affect.    Intake/Output Summary (Last 24 hours) at 09/21/12 0657 Last data filed at 09/21/12 0600  Gross per 24 hour  Intake  791.5 ml  Output   1050 ml  Net -258.5 ml    Inpatient Medications:  . alum & mag hydroxide-simeth  30 mL Oral Once  . aspirin EC  81 mg Oral Daily  . carvedilol  12.5 mg Oral BID WC  . docusate sodium  100 mg Oral BID  . heparin  5,000 Units Subcutaneous Q8H  . hydrALAZINE  75 mg Oral TID  . isosorbide mononitrate  90 mg Oral Daily  . pantoprazole  40 mg Oral Daily  . sodium chloride  3 mL Intravenous Q12H  . spironolactone  25 mg Oral Daily    Labs:  Recent Labs  09/20/12 0320 09/21/12 0430  NA 131* 132*  K 4.0 3.9  CL 92* 94*  CO2 32 29  GLUCOSE 141* 132*  BUN 34* 29*  CREATININE 1.47* 1.50*  CALCIUM 9.1 9.4    Recent Labs  09/20/12 1654  WBC 5.2  HGB 9.4*  HCT 29.3*    MCV 74.7*  PLT 248   Radiology/Studies:  Dg Chest 2 View 09/14/2012  *RADIOLOGY REPORT*  Clinical Data: Congestive heart failure  CHEST - 2 VIEW  Comparison: August 10, 2012.  Findings: Stable mild cardiomegaly.  No change in position of left- sided defibrillator.  No pneumothorax or pleural effusion is noted. Left lung is clear.  Mild airspace and linear opacities are noted in the right lung which are unchanged compared to prior exam.  IMPRESSION: No change involving right lung opacities compared to prior exam which may be chronic.  No definite evidence of acute cardiopulmonary abnormality seen.   Original Report Authenticated By: Lupita Raider.,  M.D.      Assessment and Plan  52 yo with history of mitral valve repair and nonischemic cardiomyopathy presented with acute on chronic systolic CHF.  1. Acute/chronic systolic CHF: Some weight loss in hospital but not marked. CVP at 11 this morning. Creatinine stable. RHC yesterday 5/8 with optimized filling pressures, cardiac index is low but not markedly so. Milrinone decreased and hydralazine increased yesterday. Will defer diuretic/inotrope dosing to Dr. Shirlee Lyons who has been following patient closely. 2. AKI: Suspect cardiorenal in the setting of diuresis. Renal function stable this morning. 3. MV repair 12/2008: last echo 12/2011. 4. Hyponatremia: improved. 5. Microcytic anemia: colonscopy 2012 with gastric erosion and small colonic AVMs.  Pt denies any evidence of bleeding. Continue PPI. H/o iron deficiency thus will add ferrous sulfate and instruct pt to f/u PCP. 6. Schizophrenia: stable, normal affect. 7. Noncompliance: nursing has observed that patient does not follow fluid restriction and eats whatever she wants. This may have been contributing to weight gain. 8. Morbid obesity class III BMI 60.1: weight loss will be essential for her overall prognosis. Per prior notes she has been referred to bariatric services. 9. Constipation: no BM with  bisacodyl, will try senna.  Signed, Tasha Spies PA-C  Patient seen with PA, agree with the above note.    RHC yesterday: RA mean 10  RV 28/5  PA 23/10, mean 15  PCWP mean 17  Oxygen saturations:  PA 57%  AO 100%  Cardiac Output (Fick) 5.7  Cardiac Index (Fick) 2.17   Milrinone decreased to 0.125, Co-ox 71% this morning.  She feels well.   Creatinine stable at 1.5.  I am going to restart torsemide 100 mg daily today and will turn off milrinone.  She can go home today.  Will need followup in CHF clinic next week with BMET and also will need followup with me in a month.  I am going to hold off on metolazone and losartan for now, losartan can be gradually restarted as outpatient.  Meds for d/c: Torsemide 100 mg daily, Coreg 18.75 mg bid, Imdur 90 daily, hydralazine 75 mg tid, ASA 81, spironolactone 25 daily.  Hold losartan and metolazone for now.   [name] 09/21/2012 7:38 AM

## 2012-09-21 NOTE — Discharge Summary (Signed)
Discharge Summary   Patient ID: [name] MRN: [id number], DOB/AGE: [birth year] 52 y.o. Admit date: 09/13/2012 D/C date:     09/21/2012  Primary Cardiologist: Shirlee Latch & CHF clinic  Primary Discharge Diagnoses:  1. Acute on chronic systolic CHF/NICM - RHC this admission, see below - 12/2011: s/p SJM ICD - ARB on hold due to renal function but consider r/s as outpatient 2. Morbid obesity class III with dietary noncompliance 3. Medication noncompliance 4. Acute kidney injury, suspect cardiorenal syndrome - Cr 1.50 at discharge 5. Hyponatremia, improved 6. Microcytic anemia - h/o capsule endoscopy 2012 with gastric erosion and colonic AVMs - iron added due to history of iron deficiency - instructed to f/u PCP 7. Schizophrenia 8. OSA, on CPAP 9. Constipation 10. Suspected diabetes mellitus, not formally evaluated this admission  Secondary Discharge Diagnoses:  1. Severe MR s/p repair - Minimally invasive MV repair on 12/18/08. Additionally TV repair and PFO closure 2. H/o gastric ulcer 3. GERD 4. HTN 5. Gout 6. Depression 7. Colitis 8. Chronic back pain 9. Chronic knee pain 10. H/o DVT R leg 11. Anxiety 12. Rheumatoid arthritis 13. Sleep disurbance 14. Hx UTI 15. Nocturia 16. COPD 17. Bronchitis 18. PTSD 19. Schizophrenia 20. Dysmenorrhea   Hospital Course: [name] is a 52 y/o F with history of chronic systolic chf/NICM, mitral valve repair, morbid obesity who presented to [facility] with complaints of orthopnea. 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 When seen in CHF clinic on 3/31, she had been out of her torsemide for a week. This was resumed and metolazone 2.5mg  was added for 2 days. When she f/u on 4/16, she was still felt to be volume overloaded and still had not picked up her torsemide. She was up 14 lbs since prior visit and she was again instructed to refill her torsemide and use metolazone x 2 days  then weekly dosing. Over the past week, she has been experiencing significant orthopnea, causing her to sleep sitting up on her couch. She denied any dizziness, syncope, chest pain, changes in abdominal girth, or change in LEE. She was seen in CHF clinic 4/30 and continued to exhibit volume overload. Her weight was down from prior visit to 385 from 390, but still far off from the 370's of Feb and early March. Labs were drawn and revealed acute creatinine elevation @ 1.83. She was contacted the following day for admission with plan for diuresis. She was placed on IV lasix. ARB was held due to renal insufficiency which was felt due to cardiorenal syndrome. In order to facilitate diuresis, she was started on milrinone with careful monitoring of her CVP, Co-Ox over the next few days. BB was cut down to 12.5mg  BID given acute exacerbation of CHF. Her Cr was monitored. On 09/18/12 her Cr was rising so diuretics were held and milrinone was increased. Exam, CVP (which remained 7-9 in the latter half of hospitalization) and weights (fluctuated 382 - 375 - 389 - 378) were conflicting thus RHC was recommended. Results as below: Procedural Findings:  Hemodynamics (mmHg)  RA mean 10  RV 28/5  PA 23/10, mean 15  PCWP mean 17  Oxygen saturations:  PA 57%  AO 100%  Cardiac Output (Fick) 5.7  Cardiac Index (Fick) 2.17  Filling pressures appeared to be optimized. Cardiac index was low but not markedly so. Hydralazine was increased and milrinone was titrated down. Today she is feeling better and back  to baseline. Weight 382 adm  -> 378 today. Nursing has noted dietary indiscretion in that the patient ate a significant amount and did not watch her fluid intake. She has previously been referred to bariatric clinic and we will ask that she continue to follow through with this. This morning, her torsemide was resumed. Milrinone was discontinued. She ambulated without complication. Dr. Shirlee Latch has seen and examined her and feels  she is stable for discharge.  Per Dr. Shirlee Latch, we will discharge her on Torsemide 100 mg daily (home dose), Coreg 18.75 mg bid (slightly decreased from home dose), Imdur 90 daily, hydralazine 75 mg tid (home dose), ASA 81, spironolactone 25 daily (home dose), and continue to hold ARB/metolazone in the setting of fluctuating renal insufficiency. Losartan can be gradually restarted as an outpatient. Due to renal function and fluctuating potassium needs, we have decreased potassium to once daily (admitting K 4.7, only required intermittent KCl repletion while on IV Lasix). Compliance with meds was strongly encouraged. I have left message on scheduling voicemail for f/u appt 1 month with Dr. Shirlee Latch and have also sent a message to the CHF clinic for appointment within 1 week with BMET.  She remains active with [facility] and [facility].   She was also instructed to f/u pcp for - 1) follow up on previous referral to bariatric clinic - 2) anemia in setting of 2012 capsule endo findings noted above (iron was added due to hx of iron deficiency, Hgb remained relatively stable this admission, she denied any evidence of bleeding, continue PPI) - 3) formal eval for diabetes since blood sugars were elevated  Discharge Vitals: Blood pressure 107/64, pulse 94, temperature 98.1 F (36.7 C), temperature source Oral, resp. rate 26, height 5\' 6"  (1.676 m), weight 378 lb 1.4 oz (171.5 kg), last menstrual period 05/05/2012, SpO2 99.00%.  Labs: Lab Results  Component Value Date   WBC 5.2 09/20/2012   HGB 9.4* 09/20/2012   HCT 29.3* 09/20/2012   MCV 74.7* 09/20/2012   PLT 248 09/20/2012     Recent Labs Lab 09/21/12 0430  NA 132*  K 3.9  CL 94*  CO2 29  BUN 29*  CREATININE 1.50*  CALCIUM 9.4  GLUCOSE 132*   No results found for this basename: CKTOTAL, CKMB, TROPONINI,  in the last 72 hours Lab Results  Component Value Date   CHOL 157 07/05/2010   HDL 39.40 07/05/2010   LDLCALC 105* 07/05/2010   TRIG 62.0 07/05/2010   Lab Results   Component Value Date   DDIMER 3.47* 08/10/2012    Diagnostic Studies/Procedures   RHC as above, also see full report  Dg Chest 2 View 09/14/2012  *RADIOLOGY REPORT*  Clinical Data: Congestive heart failure  CHEST - 2 VIEW  Comparison: August 10, 2012.  Findings: Stable mild cardiomegaly.  No change in position of left- sided defibrillator.  No pneumothorax or pleural effusion is noted. Left lung is clear.  Mild airspace and linear opacities are noted in the right lung which are unchanged compared to prior exam.  IMPRESSION: No change involving right lung opacities compared to prior exam which may be chronic.  No definite evidence of acute cardiopulmonary abnormality seen.   Original Report Authenticated By: Lupita Raider.,  M.D.     Discharge Medications     Medication List    STOP taking these medications       losartan 100 MG tablet  Commonly known as:  COZAAR     metolazone 2.5 MG tablet  Commonly known as:  ZAROXOLYN      TAKE these medications       amitriptyline 150 MG tablet  Commonly known as:  ELAVIL  Take 150 mg by mouth at bedtime.     aspirin EC 81 MG tablet  Take 81 mg by mouth daily.     carvedilol 12.5 MG tablet  Commonly known as:  COREG  Take 1.5 tablets (18.75 mg total) by mouth 2 (two) times daily with a meal.     esomeprazole 40 MG capsule  Commonly known as:  NEXIUM  Take 40 mg by mouth daily before breakfast.     ferrous sulfate 325 (65 FE) MG tablet  Take 1 tablet (325 mg total) by mouth 2 (two) times daily with a meal.     hydrALAZINE 50 MG tablet  Commonly known as:  APRESOLINE  Take 75 mg by mouth 3 (three) times daily.     HYDROcodone-acetaminophen 7.5-325 MG per tablet  Commonly known as:  NORCO  Take 1 tablet by mouth every 6 (six) hours as needed for pain.     isosorbide mononitrate 60 MG 24 hr tablet  Commonly known as:  IMDUR  Take 90 mg by mouth daily.     potassium chloride SA 20 MEQ tablet  Commonly known as:  K-DUR,KLOR-CON    Take 2 tablets (40 mEq total) by mouth daily.     spironolactone 25 MG tablet  Commonly known as:  ALDACTONE  Take 25 mg by mouth daily.     torsemide 100 MG tablet  Commonly known as:  DEMADEX  Take 1 tablet (100 mg total) by mouth daily.        Disposition   The patient will be discharged in stable condition to home. Discharge Orders   Future Appointments Provider Department Dept Phone   09/24/2012 3:15 PM Manuela Schwartz, MD [facility] [phone]   10/01/2012 11:45 AM [facility] [employer] Main Office [place]   10/18/2012 12:45 PM Laurey Morale, MD [facility] Main Office [place]) [phone]   Future Orders Complete By Expires     Diet - low sodium heart healthy  As directed     Discharge instructions  As directed     Comments:      It is important to take your medicines as prescribed. Not taking your medications can have serious consequences.  For patients with congestive heart failure, we give them these special instructions:  1. Follow a low-salt diet and watch your fluid intake. In general, you should not be taking in more than 2 liters of fluid per day (no more than 8 glasses per day). Some patients are restricted to less than 1.5 liters of fluid per day (no more than 6 glasses per day). This includes sources of water in foods like soup, coffee, tea, milk, etc. 2. Weigh yourself on the same scale at same time of day and keep a log. 3. Call your doctor: (Anytime you feel any of the following symptoms)  - 3-4 pound weight gain in 1-2 days or 2 pounds overnight  - Shortness of breath, with or without a dry hacking cough  - Swelling in the hands, feet or stomach  - If you have to sleep on extra pillows at night in order to breathe   IT IS IMPORTANT TO LET YOUR DOCTOR KNOW EARLY ON IF YOU ARE HAVING SYMPTOMS SO WE CAN HELP YOU!    Increase activity slowly  As directed       Follow-up Information    Follow up with Kristie Cowman, MD. (Follow up with your primary care doctor for 1) your anemia, 2) to discuss referral to bariatric clinic for weight loss options, and 3) to discuss evaluation for diabetes since your blood sugar was elevated.)    Contact information:   [address] [place] [address] [phone]       Follow up with [facility]. (Their office will call you for a 1-week followup appointment and labwork - call office if you have not heard from them within 2-3 business days)    Contact information:   [facility] [address] [place] [address] [phone]      Follow up with [name], MD. (His office will call for a follow-up appointment to see Dr. Shirlee Latch in 1 month - call office if you have not heard from them within 2-3 business days)    Contact information:   [facility] [address] [place] [address] [phone]         Duration of Discharge Encounter: Greater than 30 minutes including physician and PA time.  Signed, Ronie Spies PA-C 09/21/2012, 8:23 AM

## 2012-09-23 DIAGNOSIS — I5022 Chronic systolic (congestive) heart failure: Secondary | ICD-10-CM

## 2012-09-24 ENCOUNTER — Encounter: Payer: Self-pay | Admitting: Internal Medicine

## 2012-09-24 ENCOUNTER — Ambulatory Visit (INDEPENDENT_AMBULATORY_CARE_PROVIDER_SITE_OTHER): Payer: Medicare Other | Admitting: Internal Medicine

## 2012-09-24 ENCOUNTER — Telehealth: Payer: Self-pay | Admitting: Cardiology

## 2012-09-24 VITALS — BP 108/69 | HR 97 | Temp 98.3°F | Ht 66.0 in | Wt 382.2 lb

## 2012-09-24 DIAGNOSIS — M1710 Unilateral primary osteoarthritis, unspecified knee: Secondary | ICD-10-CM

## 2012-09-24 DIAGNOSIS — IMO0002 Reserved for concepts with insufficient information to code with codable children: Secondary | ICD-10-CM

## 2012-09-24 DIAGNOSIS — S83251S Bucket-handle tear of lateral meniscus, current injury, right knee, sequela: Secondary | ICD-10-CM

## 2012-09-24 DIAGNOSIS — I10 Essential (primary) hypertension: Secondary | ICD-10-CM

## 2012-09-24 DIAGNOSIS — I428 Other cardiomyopathies: Secondary | ICD-10-CM

## 2012-09-24 DIAGNOSIS — R269 Unspecified abnormalities of gait and mobility: Secondary | ICD-10-CM

## 2012-09-24 DIAGNOSIS — R739 Hyperglycemia, unspecified: Secondary | ICD-10-CM | POA: Insufficient documentation

## 2012-09-24 DIAGNOSIS — R7309 Other abnormal glucose: Secondary | ICD-10-CM

## 2012-09-24 DIAGNOSIS — M069 Rheumatoid arthritis, unspecified: Secondary | ICD-10-CM

## 2012-09-24 DIAGNOSIS — M25562 Pain in left knee: Secondary | ICD-10-CM

## 2012-09-24 DIAGNOSIS — I5022 Chronic systolic (congestive) heart failure: Secondary | ICD-10-CM

## 2012-09-24 DIAGNOSIS — I080 Rheumatic disorders of both mitral and aortic valves: Secondary | ICD-10-CM

## 2012-09-24 DIAGNOSIS — M25569 Pain in unspecified knee: Secondary | ICD-10-CM

## 2012-09-24 DIAGNOSIS — Z95810 Presence of automatic (implantable) cardiac defibrillator: Secondary | ICD-10-CM

## 2012-09-24 NOTE — Assessment & Plan Note (Signed)
Difficulty ambulating secondary to pain and body habitus.

## 2012-09-24 NOTE — Telephone Encounter (Signed)
New problem  Karen/[facility] need additional orders for PT eval and OT eval. Please call Clydie Braun

## 2012-09-24 NOTE — Assessment & Plan Note (Signed)
BP Readings from Last 3 Encounters:  09/24/12 108/69  09/21/12 124/76  09/21/12 124/76    Lab Results  Component Value Date   NA 132* 09/21/2012   K 3.9 09/21/2012   CREATININE 1.50* 09/21/2012    Assessment: Blood pressure control: controlled Progress toward BP goal:  at goal Comments:   Plan: Medications:  continue current medications Educational resources provided: brochure Self management tools provided: home blood pressure logbook Other plans: continue carvedilol 12.5 twice a day, hydralazine 75 mg 3 times a day, and or 60 mg daily torsemide 100 mg daily and spironolactone 25 mg daily

## 2012-09-24 NOTE — Patient Instructions (Addendum)
General Instructions:  Keep your appointments with the cardiologist this week and in June. I have ordered a wheelchair to help you get around better. Keep checking with [facility] concerning your wait-list status for gastric bypass. Continue to take your medications as prescribed. We will check you for diabetes.  If you need medications we will contact you. Follow-up with me in 6 months or sooner if needed.  Treatment Goals:  Goals (1 Years of Data) as of 09/24/12         As of Today 09/21/12 09/21/12 09/21/12 09/21/12     Blood Pressure    . Blood Pressure < 140/90  108/69 124/76 107/64 118/66 106/71     Lifestyle    . Prevent Falls            Progress Toward Treatment Goals:  Treatment Goal 09/24/2012  Blood pressure at goal    Self Care Goals & Plans:  Self Care Goal 09/24/2012  Manage my medications take my medicines as prescribed; bring my medications to every visit; refill my medications on time  Eat healthy foods drink diet soda or water instead of juice or soda; eat more vegetables; eat foods that are low in salt; eat baked foods instead of fried foods  Be physically active -       Care Management & Community Referrals:

## 2012-09-24 NOTE — Assessment & Plan Note (Signed)
To follow up with Cardiology 09/27/2012, and 10/18/2012.  No complaints of dyspnea on exam today.

## 2012-09-24 NOTE — Telephone Encounter (Signed)
Spoke with karen, verbal order given

## 2012-09-24 NOTE — Progress Notes (Signed)
  Subjective:    Patient ID: [name], female    DOB: 11/12/1960, 52 y.o.   MRN: [id number]  HPI  Ms. Palomo has a history significant for chronic systolic congestive heart failure, chronic kidney disease stage III, nonischemic/valvular cardiomyopathy, status post ICD, hypertension, schizoaffective disorder, morbid obesity BMI greater than 60, degenerative joint disease of the knee, and recent admission for acute on chronic systolic congestive heart failure. Presents to clinic today requesting a wheelchair.  Patient states that she is severely restricted from mobilizing in her home secondary to bilateral knee pain from DJD of bilateral knees and shortness of breath from her congestive heart failure.  States that she uses the walker to stand but is associated with severe knee pain and cannot mobilize. Denies shortness of breath, chest pain, palpitations today. Pt is also requesting to see "someone" for management of her rheumatoid arthritis as she reports severe arm pain associated symptoms.   Review of Systems  Constitutional: Negative for diaphoresis and fatigue.  HENT: Negative for congestion.   Respiratory: Negative for chest tightness and shortness of breath.   Cardiovascular: Negative for chest pain.  Gastrointestinal: Negative for abdominal pain.  Musculoskeletal: Positive for arthralgias and gait problem. Negative for myalgias.  Neurological: Negative for light-headedness, numbness and headaches.  Psychiatric/Behavioral: Negative for confusion and dysphoric mood.       Objective:   Physical Exam  Constitutional: She is oriented to person, place, and time. She appears well-developed and well-nourished. No distress.  Morbidly obese AA female, in wheelchair  HENT:  Head: Normocephalic and atraumatic.  Eyes: Conjunctivae and EOM are normal. Pupils are equal, round, and reactive to light.  Neck: Normal range of motion. Neck supple. No thyromegaly present.  Cardiovascular:   Slightly tachycardic  Pulmonary/Chest: Breath sounds normal. She is in respiratory distress. She has no wheezes.  Abdominal: Soft. Bowel sounds are normal. There is no tenderness.  obese  Musculoskeletal:       Right knee: She exhibits decreased range of motion. She exhibits normal alignment. Tenderness found.       Left knee: She exhibits decreased range of motion. Tenderness found.  Neurological: She is alert and oriented to person, place, and time.  Skin: Skin is warm and dry.  Psychiatric: She has a normal mood and affect. Her behavior is normal. Judgment and thought content normal.          Assessment & Plan:  #1 chronic systolic CHF: no acute exacerbation today; on carvedilol 12.5 mg twice a day hydralazine 75 mg 3 times a day, Imdur 90 mg qd, spironolactone 25 mg daily, and torsemide 100 mg daily. She is due for followup with cardiologist Dr. Shirlee Latch 09/27/2012.  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: She is due for ICD check 10/01/2012 [facility]  #3 hypertension: at goal today on  above noted regimen  #4 degenerative joint disease of the knees: exacerbated by morbid obesity, tramadol and steroid injections ineffective -will order wheelchair  #5 morbid obesity BMI greater than 60: patient received prior referral for gastric bypass evaluation -on waiting list at [facility]  #6 rheumatoid arthritis: Patient requesting to see specialist -Will refer to rheumatologist  #7 hyperglycemia: pt had elevated cbgs during hospital admission -will check HbgA1c today

## 2012-09-24 NOTE — Assessment & Plan Note (Signed)
She is on waiting list for gastric bypass surgery at [facility].

## 2012-09-25 NOTE — Progress Notes (Signed)
INTERNAL MEDICINE TEACHING ATTENDING ADDENDUM: I discussed this case with Dr. Schooler soon after the patient visit. I have read the documentation and I agree with the plan of care. Please see the resident note for details of management.    

## 2012-09-26 ENCOUNTER — Telehealth: Payer: Self-pay | Admitting: *Deleted

## 2012-09-26 NOTE — Telephone Encounter (Signed)
Call from pt needs Handicap Sticker.  Pt said that she is unable to walk long distances  and would like to get closer parking.  Angelina Ok, RN 09/26/2012 2:30 PM

## 2012-09-27 ENCOUNTER — Ambulatory Visit (HOSPITAL_COMMUNITY)
Admission: RE | Admit: 2012-09-27 | Discharge: 2012-09-27 | Disposition: A | Discharge status: Home / Self Care | Payer: Medicare Other | Source: Ambulatory Visit | Attending: Internal Medicine | Admitting: Internal Medicine

## 2012-09-27 ENCOUNTER — Encounter (HOSPITAL_COMMUNITY): Payer: Self-pay

## 2012-09-27 VITALS — BP 125/80 | HR 60 | Ht 66.0 in | Wt 379.8 lb

## 2012-09-27 DIAGNOSIS — R0989 Other specified symptoms and signs involving the circulatory and respiratory systems: Secondary | ICD-10-CM | POA: Insufficient documentation

## 2012-09-27 DIAGNOSIS — I5022 Chronic systolic (congestive) heart failure: Secondary | ICD-10-CM

## 2012-09-27 DIAGNOSIS — R0609 Other forms of dyspnea: Secondary | ICD-10-CM | POA: Insufficient documentation

## 2012-09-27 DIAGNOSIS — I5020 Unspecified systolic (congestive) heart failure: Secondary | ICD-10-CM | POA: Insufficient documentation

## 2012-09-27 MED ORDER — METOLAZONE 2.5 MG PO TABS: 2.5000 mg | ORAL_TABLET | ORAL | Status: DC | PRN

## 2012-09-27 NOTE — Assessment & Plan Note (Addendum)
She is stable post-hospitalization. Cath results reviewed with her. Numbers surprisingly well compensated. Exam very difficult but weight stable. She does not have scale big enough for her. She is in process of getting one from [facility]. Continue current diuretic regimen. Reinforced need for daily weights and reviewed use of sliding scale diuretics. Instructed to take Metolazone 2.5 mg once a week as needed for lower extremity edema. Reinforced medication compliance, limiting fluid intake to < 2 liters. [facility] to get BMET. F/u with Dr. Shirlee Latch in a few weeks and HF Clinic in 2 months.  NOTE: She was taken off losartan while in hospital due to renal insufficiency. We have scheduled labs to be drawn next week. If K and renal function stable will need to restart.

## 2012-09-27 NOTE — Patient Instructions (Addendum)
Follow up in 2-3 months  You can take metolazone 2.5 mg once a week for lower extremity edema.   Do the following things EVERYDAY: 1) Weigh yourself in the morning before breakfast. Write it down and keep it in a log. 2) Take your medicines as prescribed 3) Eat low salt foods-Limit salt (sodium) to 2000 mg per day.  4) Stay as active as you can everyday 5) Limit all fluids for the day to less than 2 liters

## 2012-09-28 ENCOUNTER — Encounter: Payer: Self-pay | Admitting: Cardiology

## 2012-09-29 NOTE — Progress Notes (Signed)
Patient ID: [name], female   DOB: July 09, 1960, 52 y.o.   MRN: [id number] PCP: Dr Bosie Clos Primary Cardiologist: Dr. Shirlee Latch  HPI: [name] is a 52 yo with a h/o morbid obesity, systolic CHF secondary to NICM and severe MR now s/p MV repair, rheumatoid arthritis, schizophrenia, and PUD.     Echo in 6/13 showed EF 30-35% with no MR and no significant mitral stenosis.    She was seen in HF clinic in 2/13 and admitted because of volume overload.  She was diuresed and discharged on torsemide 80 qam, 40 qpm.  She was re-admitted in 3/13 after overdosing on Seroquel with hypotension.  She was re-admitted again in 5/13 for ADHF after she quit taking her cardiac meds.  She had a St Jude ICD placed in 8/13.  She was taken off hydralazine when she was in the hospital for ICD b/c of low BP.   Admitted to [facility] 09/13/12 due to elevated creatinine (1.83). Diuresed with IV lasix and Milrinone. ARB discontinued.  Discharge creatinine 1.5. Discharge weight 378 pounds. Losartan stopped while in hospital due to renal insufficiency.  RHC 09/20/12  RA mean 10  RV 28/5  PA 23/10, mean 15  PCWP mean 17  Oxygen saturations:  PA 57%  AO 100%  Cardiac Output (Fick) 5.7  Cardiac Index (Fick) 2.17   She returns for follow up today.  Ongoing exertional dyspnea. Denies PND. + Orthopnea (sleeps on 2-3 pillows). She does not weigh at home. Scale at home is not working. Taking all medications. Drinks > 2 liters per day. Tries to follow low salt diet. Limited mobility due to bilateral knee pain. She is unable to bend over and tie her shoes. No shocks.AHC following. She is not using CPAP.     ROS: All pertinent positives and negatives as in HPI, otherwise negative  Allergies:  1) ! * Colchicine  2) ! Morphine   Past History:  1. Congestive heart failure, most likely secondary to severe mitral regurgitation. TEE (6/10): EF 40%, diffuse hypokinesis, mild to moderately dilated LV, severe eccentric MR, small PFO.  Cardiac MRI (6/10): EF 37% with global hypokinesis, moderate to severe MR, no delayed enhancement (no evidence for sarcoidosis or other infiltrative disease). TTE (10/10) after MV repair: EF 25-30%, moderately dilated LV, moderate diastolic dysfunction, mildly depressed RV function, trivial MR, MV mean gradient 5 mmHg, PASP 30 mmHg. RHC (12/10): mean RA 19, PA 46/26, mean PCWP 28, CI 2.5, SVO2 63%. TTE (2/11): EF 35-40% with diffuse hypokinesis, no significant MR or MS. TTE (7/11): EF 45%, mild global hypokinesis, s/p MV repair, no mitral regurgitation, minimal mitral stenosis, PA systolic pressure 40 mmHg, mild LV dilation.  TTE (11/12): EF 30-35%, mild LV dilation, mild LVH, s/p MV repair with no regurgitation and minimal stenosis.  TTE (3/13): EF 25-30%, diffuse hypokinesis, no significant mitral stenosis by pressure halftime with mean gradient 7 mmHg across valve, no MR.  Echo (6/13): EF 30-35%, mildly dilated LV, mild mitral stenosis but no MR.  St Jude ICD placed in 8/13.  2. Severe mitral regurgitation (see above). Minimally invasive mitral valve repair on 12/18/08. She additionally had TV repair and PFO closure.  3. Microcytic anemia: EGD and colonoscopy in 12/11 did not reveal source of bleeding.  4. H/o gastric ulcers.  5. Morbid obesity.  6. Obstructive sleep apnea, on CPAP.  7. GERD.  8. Hypertension, controlled.  9. Rheumatoid arthritis  10. Gout.  11. Depression.  12. LHC (6/10): No angiographic  CAD. Mildly dilated LV. 3+ MR. EF 40%.  13. Prior smoking, quit  14. Colitis (2/11)  15. Schizophrenia versus schizoaffective disorder 16. History of right leg DVT    Current Outpatient Prescriptions  Medication Sig Dispense Refill  . amitriptyline (ELAVIL) 150 MG tablet Take 150 mg by mouth at bedtime.      Marland Kitchen aspirin EC 81 MG tablet Take 81 mg by mouth daily.      . carvedilol (COREG) 12.5 MG tablet Take 1.5 tablets (18.75 mg total) by mouth 2 (two) times daily with a meal.      .  esomeprazole (NEXIUM) 40 MG capsule Take 40 mg by mouth daily before breakfast.      . ferrous sulfate 325 (65 FE) MG tablet Take 1 tablet (325 mg total) by mouth 2 (two) times daily with a meal.  60 tablet  1  . hydrALAZINE (APRESOLINE) 50 MG tablet Take 75 mg by mouth 3 (three) times daily.      Marland Kitchen HYDROcodone-acetaminophen (NORCO) 7.5-325 MG per tablet Take 1 tablet by mouth every 6 (six) hours as needed for pain.      . isosorbide mononitrate (IMDUR) 60 MG 24 hr tablet Take 90 mg by mouth daily.      . potassium chloride SA (K-DUR,KLOR-CON) 20 MEQ tablet Take 2 tablets (40 mEq total) by mouth daily.      Marland Kitchen spironolactone (ALDACTONE) 25 MG tablet Take 25 mg by mouth daily.      Marland Kitchen torsemide (DEMADEX) 100 MG tablet Take 1 tablet (100 mg total) by mouth daily.  30 tablet  6  . metolazone (ZAROXOLYN) 2.5 MG tablet Take 1 tablet (2.5 mg total) by mouth as needed.  6 tablet  3  . [DISCONTINUED] QUEtiapine (SEROQUEL) 100 MG tablet Take 400 mg by mouth at bedtime.        No current facility-administered medications for this encounter.     Physical Exam:  Filed Vitals:   09/27/12 1131  BP: 125/80  Pulse: 60  Height: 5\' 6"  (1.676 m)  Weight: 379 lb 12.8 oz (172.276 kg)    General: NAD, obese.  Neck: JVP hard to see does appear slightly  elevated, no thyromegaly or thyroid nodule. No carotid bruit.  Lungs: Clear to auscultation bilaterally with normal respiratory effort. CV: Nondisplaced PMI.  Heart regular S1/S2, no S3/S4, no murmur.  Abdomen: Obese Soft, nontender.  Neurologic: Alert and oriented x 3.  Psych: Normal affect. Extremities: No clubbing or cyanosis. Normal pedal pulses.  Tr-1+ lower extremity edema.

## 2012-10-01 ENCOUNTER — Encounter: Payer: Medicare Other | Admitting: *Deleted

## 2012-10-01 ENCOUNTER — Ambulatory Visit (INDEPENDENT_AMBULATORY_CARE_PROVIDER_SITE_OTHER): Payer: Medicare Other | Admitting: *Deleted

## 2012-10-01 DIAGNOSIS — I428 Other cardiomyopathies: Secondary | ICD-10-CM

## 2012-10-01 DIAGNOSIS — I509 Heart failure, unspecified: Secondary | ICD-10-CM

## 2012-10-01 DIAGNOSIS — I5023 Acute on chronic systolic (congestive) heart failure: Secondary | ICD-10-CM

## 2012-10-01 LAB — ICD DEVICE OBSERVATION
BMOD-0002RV: 8
CHARGE TIME: 7.5 s
DEV-0020ICD: NEGATIVE
DEVICE MODEL ICD: 1022457
RV LEAD AMPLITUDE: 12 mv
RV LEAD IMPEDENCE ICD: 525 Ohm
RV LEAD THRESHOLD: 0.75 V
TOT-0007: 1
TOT-0008: 0
TOT-0009: 0
TZAT-0001SLOWVT: 1
TZAT-0013SLOWVT: 3
TZAT-0020SLOWVT: 1 ms
TZON-0005SLOWVT: 6
TZST-0001SLOWVT: 2
TZST-0001SLOWVT: 4
TZST-0001SLOWVT: 5
TZST-0003SLOWVT: 800 V
TZST-0003SLOWVT: 875 V
TZST-0003SLOWVT: 875 V

## 2012-10-01 NOTE — Progress Notes (Signed)
icd check in clinic. Normal device function. No changes made. Merlin 01-07-13 and ROV in February with SK.

## 2012-10-02 ENCOUNTER — Encounter: Payer: Self-pay | Admitting: *Deleted

## 2012-10-03 NOTE — Telephone Encounter (Signed)
She will need to bring in Handicap Sticker Application.

## 2012-10-04 ENCOUNTER — Telehealth: Payer: Self-pay | Admitting: Internal Medicine

## 2012-10-04 NOTE — Telephone Encounter (Signed)
Patient needs help hooking up home device.

## 2012-10-04 NOTE — Telephone Encounter (Signed)
Left message for patient with instructions to hook up Merlin transmitter and also the # for tech support @ [facility] if she continues to have problems.

## 2012-10-09 ENCOUNTER — Telehealth: Payer: Self-pay | Admitting: *Deleted

## 2012-10-09 NOTE — Telephone Encounter (Signed)
Pt calls and ask how to get a handicap sticker/ plate/ placard, could you please help her, # [phone]

## 2012-10-10 NOTE — Telephone Encounter (Signed)
I will follow up with patient today.  If patient calls prior to me reaching her, please let her know she will need to have prescription from PCP.  The physicians have the form which she takes to the Dept [employer].

## 2012-10-11 ENCOUNTER — Telehealth: Payer: Self-pay | Admitting: Licensed Clinical Social Worker

## 2012-10-11 NOTE — Telephone Encounter (Signed)
[name] returned call to CSW, provided information for application.  Pt informed of cost and will need pt's signature.  Pt aware and states she is now w/c bound and requesting physician to complete application.  CSW will forward to Dr. Bosie Clos.

## 2012-10-11 NOTE — Telephone Encounter (Signed)
Ms. Weng referred to CSW for assistance obtaining Handicap Placard.  CSW placed called to pt.  CSW left message requesting return call. CSW provided contact hours and phone number.  CSW has application and will forward to PCP once pt returns call.

## 2012-10-12 ENCOUNTER — Telehealth: Payer: Self-pay | Admitting: *Deleted

## 2012-10-12 NOTE — Telephone Encounter (Signed)
Call from Dr. Fatima Sanger office will be unable to take patient in her practice.  Suggestion to have patient go to another Rheumatologist in [place].  Call to [facility] to see if pt can be seen there by Dr. Nickola Major.  Message left on new patient line for a call.  Angelina Ok, RN 10/12/2012 11:43 AM.

## 2012-10-17 NOTE — Telephone Encounter (Signed)
Sign form placed in mail per pt's request.

## 2012-10-18 ENCOUNTER — Encounter: Payer: Self-pay | Admitting: Cardiology

## 2012-10-18 ENCOUNTER — Ambulatory Visit (INDEPENDENT_AMBULATORY_CARE_PROVIDER_SITE_OTHER): Payer: Medicare Other | Admitting: Cardiology

## 2012-10-18 VITALS — BP 118/64 | HR 102 | Ht 66.0 in | Wt 343.6 lb

## 2012-10-18 DIAGNOSIS — I5023 Acute on chronic systolic (congestive) heart failure: Secondary | ICD-10-CM

## 2012-10-18 DIAGNOSIS — I509 Heart failure, unspecified: Secondary | ICD-10-CM

## 2012-10-18 DIAGNOSIS — R0602 Shortness of breath: Secondary | ICD-10-CM

## 2012-10-18 DIAGNOSIS — I5022 Chronic systolic (congestive) heart failure: Secondary | ICD-10-CM

## 2012-10-18 DIAGNOSIS — I080 Rheumatic disorders of both mitral and aortic valves: Secondary | ICD-10-CM

## 2012-10-18 DIAGNOSIS — I10 Essential (primary) hypertension: Secondary | ICD-10-CM

## 2012-10-18 MED ORDER — LOSARTAN POTASSIUM 25 MG PO TABS: 25.0000 mg | ORAL_TABLET | Freq: Every day | ORAL | Status: DC

## 2012-10-18 NOTE — Patient Instructions (Addendum)
Start losartan 25mg  daily.  Your physician recommends that you return for lab work in: 2 weeks--BMET/BNP.   Your physician recommends that you schedule a follow-up appointment in: 2 months with Dr Shirlee Latch.

## 2012-10-19 NOTE — Progress Notes (Signed)
Patient ID: [name], female   DOB: Mar 31, 1961, 52 y.o.   MRN: [id number] PCP: Dr. Bosie Clos  52 yo with systolic CHF probably secondary to severe MR now s/p MV repair, rheumatoid arthritis, schizophrenia, and PUD presents for followup.  Last echo in 6/13 showed EF 30-35% with no MR and no significant mitral stenosis.  Patient was seen in CHF clinic in 2/13 and admitted because of volume overload.  She was diuresed and discharged on torsemide 80 qam, 40 qpm.  She was re-admitted in 3/13 after overdosing on Seroquel with hypotension.  She was re-admitted again in 5/13 after she quit taking her cardiac meds and became short of breath.  She was restarted on her meds and diuresed.  She had a St Jude ICD placed in 8/13.  She was admitted in 5/14 with acute on chronic systolic CHF and elevated creatinine.  She was put on IV milrinone and diuresed with IV Lasix.  Losartan was stopped due to increased creatinine.  RHC showed reasonable filling pressures after diuresis and CI 2.17.   Symptoms are stable.  At baseline, she is inactive due to knee pain bilaterally.  She has been referred by her PCP to rheumatology.  She is taking all her medications.  Weight is inaccurate (not down 30 lbs).  She is not short of breath walking around her house.  She sleeps on 3 pillows chronically.  She uses a walker outside the house.   No lightheadedness or syncope. No PND.  She is using metolazone once weekly if she notes lower extremity edema.   Labs (8/11) K 4.4, creatinine 1.1, BNP 145  Labs (9/11): K 3.9, creatinine 1.0, BNP 84, LFTs normal, HCT 29.3  Labs (10/11): K 4.0, creatinine 1.55 => 1.2, uric acid 13, TSH normal, BNP 75 => 172  Labs (11/11): BNP 110 => 105, creatinine 3.6 => 1.2 => 1.5, K 3.7  Labs (1/12): K 3.5, creatinine 1.2, BNP 70  Labs (2/12): LDL 105, HDL 39 Labs (6/12): 3.7, creatinine 1.12 Labs (11/12): K 4.8, creatinine 0.85, BNP 88 Labs (12/12): K 3.9, creatinine 1.1, BNP 32 Labs (3/13): K 4,  creatinine 0.85 Labs (5/13): K 4, creatinine 1.04 => 0.9, BNP 715 => 36, TSH normal Labs (8/13): K 4.4, creatinine 1.1 Labs (9/13): K 4.5, creatinine 1.15, BNP 76 Labs (11/13): K 4.3, creatinine 1.1, BNP 31 Labs (12/13): K 4.7, creatinine 1.3 Labs (5/14): K 4.2, creatinine 0.8  Allergies:  1) ! * Colchicine  2) ! Morphine   Past History:  1. Congestive heart failure, most likely secondary to severe mitral regurgitation. TEE (6/10): EF 40%, diffuse hypokinesis, mild to moderately dilated LV, severe eccentric MR, small PFO. Cardiac MRI (6/10): EF 37% with global hypokinesis, moderate to severe MR, no delayed enhancement (no evidence for sarcoidosis or other infiltrative disease). TTE (10/10) after MV repair: EF 25-30%, moderately dilated LV, moderate diastolic dysfunction, mildly depressed RV function, trivial MR, MV mean gradient 5 mmHg, PASP 30 mmHg. RHC (12/10): mean RA 19, PA 46/26, mean PCWP 28, CI 2.5, SVO2 63%. TTE (2/11): EF 35-40% with diffuse hypokinesis, no significant MR or MS. TTE (7/11): EF 45%, mild global hypokinesis, s/p MV repair, no mitral regurgitation, minimal mitral stenosis, PA systolic pressure 40 mmHg, mild LV dilation.  TTE (11/12): EF 30-35%, mild LV dilation, mild LVH, s/p MV repair with no regurgitation and minimal stenosis.  TTE (3/13): EF 25-30%, diffuse hypokinesis, no significant mitral stenosis by pressure halftime with mean gradient 7 mmHg across valve,  no MR.  Echo (6/13): EF 30-35%, mildly dilated LV, mild mitral stenosis but no MR.  St Jude ICD placed in 8/13. RHC (5/14): mean RA 10, PA 23/10, mean PCWP 17, CI 2.17.   2. Severe mitral regurgitation (see above). Minimally invasive mitral valve repair on 12/18/08. She additionally had TV repair and PFO closure.  3. Microcytic anemia: EGD and colonoscopy in 12/11 did not reveal source of bleeding.  4. H/o gastric ulcers.  5. Morbid obesity.  6. Obstructive sleep apnea, on CPAP.  7. GERD.  8. Hypertension,  controlled.  9. Rheumatoid arthritis  10. Gout.  11. Depression.  12. LHC (6/10): No angiographic CAD. Mildly dilated LV. 3+ MR. EF 40%.  13. Prior smoking, quit  14. Colitis (2/11)  15. Schizophrenia versus schizoaffective disorder 16. History of right leg DVT   Family History:  Negative for cardiac or renal disease.  No FH of Colon Cancer  Social History:  Single, 5 children. Unemployed.  Tobacco Use - Yes. Smoked < 1 ppd, quit 6/10.  Alcohol Use - no  Regular Exercise - no  Drug Use - no, hx crack-cocaine use  Daily Caffeine Use: 2 daily   Review of Systems  All systems reviewed and negative except as per HPI.  Current Outpatient Prescriptions  Medication Sig Dispense Refill  . amitriptyline (ELAVIL) 150 MG tablet Take 150 mg by mouth at bedtime.      Marland Kitchen aspirin EC 81 MG tablet Take 81 mg by mouth daily.      . carvedilol (COREG) 12.5 MG tablet Take 1.5 tablets (18.75 mg total) by mouth 2 (two) times daily with a meal.      . esomeprazole (NEXIUM) 40 MG capsule Take 40 mg by mouth daily before breakfast.      . ferrous sulfate 325 (65 FE) MG tablet Take 1 tablet (325 mg total) by mouth 2 (two) times daily with a meal.  60 tablet  1  . hydrALAZINE (APRESOLINE) 50 MG tablet Take 75 mg by mouth 3 (three) times daily.      Marland Kitchen HYDROcodone-acetaminophen (NORCO) 7.5-325 MG per tablet Take 1 tablet by mouth every 6 (six) hours as needed for pain.      . isosorbide mononitrate (IMDUR) 60 MG 24 hr tablet Take 90 mg by mouth daily.      . metolazone (ZAROXOLYN) 2.5 MG tablet Take 1 tablet (2.5 mg total) by mouth as needed.  6 tablet  3  . potassium chloride SA (K-DUR,KLOR-CON) 20 MEQ tablet Take 2 tablets (40 mEq total) by mouth daily.      Marland Kitchen spironolactone (ALDACTONE) 25 MG tablet Take 25 mg by mouth daily.      Marland Kitchen torsemide (DEMADEX) 100 MG tablet Take 1 tablet (100 mg total) by mouth daily.  30 tablet  6  . losartan (COZAAR) 25 MG tablet Take 1 tablet (25 mg total) by mouth daily.  30  tablet  6  . [DISCONTINUED] QUEtiapine (SEROQUEL) 100 MG tablet Take 400 mg by mouth at bedtime.        No current facility-administered medications for this visit.    BP 118/64  Pulse 102  Ht 5\' 6"  (1.676 m)  Wt 343 lb 9.6 oz (155.856 kg)  BMI 55.48 kg/m2  SpO2 97%  LMP 05/05/2012 General: NAD, obese.  Neck: Thick neck, JVP difficult but would estimate around 7-8 cm, no thyromegaly or thyroid nodule.  Lungs: Clear to auscultation bilaterally with normal respiratory effort. CV: Nondisplaced PMI.  Heart  regular S1/S2, no S3/S4, no murmur.  No edema.  No carotid bruit.  Normal pedal pulses.  Abdomen: Soft, nontender, no hepatosplenomegaly, no distention.  Neurologic: Alert and oriented x 3.  Psych: Normal affect. Extremities: No clubbing or cyanosis.   Assessment/Plan  Mitral valve disorders  Patient is s/p MV repair. No MR and only minimal MS on last echo. Chronic systolic heart failure  Nonischemic cardiomyopathy.  NYHA class III symptoms but stable. She is compliant with her meds.  She seems to be doing reasonably well overall. - Continue current regimen of torsemide 100 mg daily and metolazone prn. - Continue current Coreg, hydralazine, imdur and spironolactone.  - Creatinine is back to normal range.  Will restart losartan at 25 mg daily with BMET/BNP in 10 days.   OBESITY This is a major issue for her.  Exercise capacity is severely limited by knee pain (history of RA, will hopefully see a rheumatologist). I am not sure that she is going to be able to make much progress given her inability to exercise.  I would agree with considering gastric bypass.   [name] 10/19/2012

## 2012-10-26 ENCOUNTER — Other Ambulatory Visit: Payer: Self-pay | Admitting: Cardiology

## 2012-10-29 ENCOUNTER — Telehealth: Payer: Self-pay | Admitting: Cardiology

## 2012-10-29 NOTE — Telephone Encounter (Signed)
New problem .     1. C/o heart rate  104 . Irregular today   2. Patient having a lot of pain in her knee. - coming to office on Thursday can this be done at the home.

## 2012-10-29 NOTE — Telephone Encounter (Signed)
Pt can send a device transmission. I will forward to Dr Shirlee Latch for recommendations.

## 2012-10-29 NOTE — Telephone Encounter (Signed)
Her heart rate was 104 and irregular today.

## 2012-10-29 NOTE — Telephone Encounter (Signed)
Spoke with [name], home health nurse. She will do BMET/BNP on Thursday so pt does not have to come to office for these.

## 2012-10-29 NOTE — Telephone Encounter (Signed)
Pt aware needs to send in a transmission. Pt unsure how to do this. I spoke with Belenda Cruise and she is going to call pt.

## 2012-10-29 NOTE — Telephone Encounter (Signed)
Need to see if in atrial fibrillation, will need ECG if she does not have a dual chamber ICD.

## 2012-10-30 ENCOUNTER — Encounter: Payer: Self-pay | Admitting: Internal Medicine

## 2012-10-30 NOTE — Telephone Encounter (Signed)
Device has not received a tracing. LMTCB to ask pt to send in a tracing.

## 2012-10-31 NOTE — Telephone Encounter (Signed)
Spoke with [name], home health nurse. If remote transmission has not been sent in by tomorrow she will try to help patient call in a transmission. Gunnar Fusi and Belenda Cruise are both aware that I am trying to get a remote transmission from pt to determine if any at fib.

## 2012-10-31 NOTE — Telephone Encounter (Signed)
LMTCB for Beth to see if she can assist patient with remote transmission.

## 2012-11-01 ENCOUNTER — Encounter: Payer: Self-pay | Admitting: Cardiology

## 2012-11-01 ENCOUNTER — Telehealth: Payer: Self-pay | Admitting: *Deleted

## 2012-11-01 ENCOUNTER — Other Ambulatory Visit: Payer: Medicare Other

## 2012-11-01 NOTE — Telephone Encounter (Signed)
Follow up   Please call Beth.

## 2012-11-01 NOTE — Telephone Encounter (Signed)
Call from [place], RN with [facility] - # [phone] Nurse states she saw pt today and pt c/o pain to legs, getting worse over last week.  She is having to increase pain med and take during day.  She used to only take at night.  Nurse is requesting stronger pain meds. Return call to [facility] and pain is from arthritis. Last week increase in pain. She reports  swelling to legs unchanged.  Nurse ordered a scale for home and will be monitoring weight.

## 2012-11-02 ENCOUNTER — Telehealth: Payer: Self-pay | Admitting: *Deleted

## 2012-11-02 NOTE — Telephone Encounter (Signed)
Call from Physical Therapy-Beth.  Pt is in a lot of pain and would like to get something more for.  Angelina Ok, RN 11/02/2012 4:47 PM

## 2012-11-05 ENCOUNTER — Other Ambulatory Visit: Payer: Self-pay | Admitting: *Deleted

## 2012-11-05 ENCOUNTER — Ambulatory Visit: Payer: Medicare Other | Admitting: Cardiology

## 2012-11-05 MED ORDER — HYDROCODONE-ACETAMINOPHEN 7.5-325 MG PO TABS: 1.0000 | ORAL_TABLET | Freq: Four times a day (QID) | ORAL | Status: DC | PRN

## 2012-11-05 NOTE — Telephone Encounter (Signed)
LMOVM for Beth to return call/kwm

## 2012-11-05 NOTE — Telephone Encounter (Signed)
Rx called in to pharmacy - pt aware. 

## 2012-11-05 NOTE — Telephone Encounter (Signed)
Per Baxter Hire. Transmission sent to [facility] by [employer]   and it did not show anything.

## 2012-11-05 NOTE — Telephone Encounter (Signed)
This transmission was sent in last week.

## 2012-11-06 ENCOUNTER — Telehealth: Payer: Self-pay | Admitting: *Deleted

## 2012-11-06 NOTE — Telephone Encounter (Signed)
Pt called upset about Rx on Norco - was for #60 instead of 120. Pt has Rx at home. Suggest to call clinic when she has few pain pills left and see if Dr Bosie Clos will rewrite Rx for 120 Norco. Pt states she understood. Stanton Kidney Tiler Brandis RN 11/06/12 9:20AM

## 2012-11-08 ENCOUNTER — Other Ambulatory Visit: Payer: Self-pay | Admitting: Internal Medicine

## 2012-11-08 ENCOUNTER — Telehealth: Payer: Self-pay | Admitting: *Deleted

## 2012-11-08 DIAGNOSIS — S83259A Bucket-handle tear of lateral meniscus, current injury, unspecified knee, initial encounter: Secondary | ICD-10-CM

## 2012-11-08 DIAGNOSIS — M25562 Pain in left knee: Secondary | ICD-10-CM

## 2012-11-08 MED ORDER — HYDROCODONE-ACETAMINOPHEN 7.5-325 MG PO TABS: 1.0000 | ORAL_TABLET | Freq: Four times a day (QID) | ORAL | Status: DC | PRN

## 2012-11-08 NOTE — Telephone Encounter (Signed)
meds were ordered

## 2012-11-08 NOTE — Telephone Encounter (Signed)
Call to pt to inform her that she can now take Norco 1-2 tabls every 6 hours for pain and that she should try to take 2 tablets prior to her Physical Therapy.  Pt voiced understanding of the plan.  Prescription for Norco 7.5-325 # 100 with instructions of 1-2 tablets every 6 hours prn for pain.  Pt to take 2 tablets prior to Physical Therapy per order of Dr. Bosie Clos.  Angelina Ok, RN 11/08/2012 2:33 PM.

## 2012-11-13 ENCOUNTER — Other Ambulatory Visit: Payer: Self-pay | Admitting: Cardiology

## 2012-11-19 ENCOUNTER — Telehealth: Payer: Self-pay | Admitting: Cardiology

## 2012-11-19 NOTE — Telephone Encounter (Signed)
Spoke w/Beth she states pt's wt was 383 on 6/23 when she saw pt and was pretty stable until 6/30 when it started increasing and today she is at 396.  She states pt's abd is tight and distended, she has 2+ pitting edema bilat LE up to knees, pt does have a cough and had to sleep on her couch last night b/c she could not lay flat.  She reports pt is taking Torsemide 100 mg and Spiro 25 daily, and pt did take prn Metolazone over the weekend with no result.  Discussed w/Amy Filbert Schilder, NP she would like Beth to get bmet today, have pt take Metolazone today and f/u with [place] in AM, Beth is aware and instruct pt on med, appt sch for tomorrow at 9:15

## 2012-11-19 NOTE — Telephone Encounter (Signed)
New Problem:    Called in because the patient has gained a lot of weight since she was seen a few weeks a gor and would like to receive orders to continue seeing the patient.  Please call back.

## 2012-11-19 NOTE — Telephone Encounter (Signed)
Returned call to [place] at [facility] she stated patient has gained weight.Stated she normally is between 383 to 386 lbs.Stated today she is 396 lbs.Stated her abdomen is swollen and tight.Lower ext 2+ edema,sob and coughing yellow phlegm.Dr.McLean out of office will speak to DOD Dr.McAlhany and call back.

## 2012-11-20 ENCOUNTER — Ambulatory Visit (HOSPITAL_COMMUNITY)
Admission: RE | Admit: 2012-11-20 | Discharge: 2012-11-20 | Disposition: A | Discharge status: Home / Self Care | Payer: Medicare Other | Source: Ambulatory Visit | Attending: Internal Medicine | Admitting: Internal Medicine

## 2012-11-20 VITALS — BP 106/68 | HR 105 | Wt >= 6400 oz

## 2012-11-20 DIAGNOSIS — Z7982 Long term (current) use of aspirin: Secondary | ICD-10-CM | POA: Insufficient documentation

## 2012-11-20 DIAGNOSIS — F209 Schizophrenia, unspecified: Secondary | ICD-10-CM | POA: Insufficient documentation

## 2012-11-20 DIAGNOSIS — Z79899 Other long term (current) drug therapy: Secondary | ICD-10-CM | POA: Insufficient documentation

## 2012-11-20 DIAGNOSIS — I5022 Chronic systolic (congestive) heart failure: Secondary | ICD-10-CM

## 2012-11-20 DIAGNOSIS — Z8711 Personal history of peptic ulcer disease: Secondary | ICD-10-CM | POA: Insufficient documentation

## 2012-11-20 DIAGNOSIS — G4733 Obstructive sleep apnea (adult) (pediatric): Secondary | ICD-10-CM | POA: Insufficient documentation

## 2012-11-20 DIAGNOSIS — M069 Rheumatoid arthritis, unspecified: Secondary | ICD-10-CM | POA: Insufficient documentation

## 2012-11-20 DIAGNOSIS — Z87891 Personal history of nicotine dependence: Secondary | ICD-10-CM | POA: Insufficient documentation

## 2012-11-20 NOTE — Patient Instructions (Addendum)
Take 5 mg Metolazone for the next 3 days  Take an extra potassium for the next 3 days  Follow up Friday am.   Do the following things EVERYDAY: 1) Weigh yourself in the morning before breakfast. Write it down and keep it in a log. 2) Take your medicines as prescribed 3) Eat low salt foods-Limit salt (sodium) to 2000 mg per day.  4) Stay as active as you can everyday 5) Limit all fluids for the day to less than 2 liters

## 2012-11-20 NOTE — Assessment & Plan Note (Addendum)
Marked volume overload with R>>L HF due to noncompliance with fluid restriction. Daughter says she drinks lots of ginger ale and two big cups of ice per day. Weight up about 25 pounds from her baseline. Suggested hospital admit however she declined. [facility] to give her 80 mg IV lasix today and tomorrow. Continue torsemide 100 mg daily and add 5 mg Metolazone for the next 3 days. Also instructed to take one additional potassium per day. Reinforced limiting fluid intake to < 2 liters per day and low salt food choices. Follow up on Friday to reassess volume if no improvement will need hospital admit. Extensive teaching and re-education provided to her and her family.

## 2012-11-22 ENCOUNTER — Other Ambulatory Visit: Payer: Self-pay | Admitting: Internal Medicine

## 2012-11-23 ENCOUNTER — Encounter (HOSPITAL_COMMUNITY): Payer: Self-pay

## 2012-11-23 ENCOUNTER — Ambulatory Visit (HOSPITAL_COMMUNITY)
Admission: RE | Admit: 2012-11-23 | Discharge: 2012-11-23 | Disposition: A | Discharge status: Home / Self Care | Payer: Medicare Other | Source: Ambulatory Visit | Attending: Internal Medicine | Admitting: Internal Medicine

## 2012-11-23 VITALS — BP 88/1 | HR 98 | Wt >= 6400 oz

## 2012-11-23 DIAGNOSIS — I5042 Chronic combined systolic (congestive) and diastolic (congestive) heart failure: Secondary | ICD-10-CM | POA: Insufficient documentation

## 2012-11-23 DIAGNOSIS — I5023 Acute on chronic systolic (congestive) heart failure: Secondary | ICD-10-CM

## 2012-11-23 DIAGNOSIS — I5022 Chronic systolic (congestive) heart failure: Secondary | ICD-10-CM

## 2012-11-23 DIAGNOSIS — I509 Heart failure, unspecified: Secondary | ICD-10-CM

## 2012-11-23 LAB — BASIC METABOLIC PANEL
CO2: 33 mEq/L — ABNORMAL HIGH (ref 19–32)
Calcium: 9.6 mg/dL (ref 8.4–10.5)
Creatinine, Ser: 1.79 mg/dL — ABNORMAL HIGH (ref 0.50–1.10)
Glucose, Bld: 124 mg/dL — ABNORMAL HIGH (ref 70–99)

## 2012-11-23 NOTE — Assessment & Plan Note (Signed)
She returns for follow up with mild improvement noted on exam but weight unchanged despite 80 mg of  IV lasix x 2 days a home. I have offered hospital admit again however she declines. Weight up about 20-25 pounds form baseline. Will continue current diuretic regimen and give 2.5 mg of Metolazone for the next 3 days. Reinforced daily weights, low salt food choices, and limiting fluid intake to < 2 liters per day. Check BMET. Follow up on Monday with Dr Shirlee Latch to reassess volume status.

## 2012-11-23 NOTE — Patient Instructions (Addendum)
Take Metolazone 2.5 mg Friday, Saturday and Sunday  Follow up Monday  Do the following things EVERYDAY: 1) Weigh yourself in the morning before breakfast. Write it down and keep it in a log. 2) Take your medicines as prescribed 3) Eat low salt foods-Limit salt (sodium) to 2000 mg per day.  4) Stay as active as you can everyday 5) Limit all fluids for the day to less than 2 liters

## 2012-11-23 NOTE — Progress Notes (Signed)
Patient ID: BUNA CUPPETT, female   DOB: [birth year], 52 y.o.   MRN: [id number] PCP: Dr Bosie Clos Primary Cardiologist: Dr. Shirlee Latch  HPI: Ms. Lobdell is a 52 yo with a h/o morbid obesity, systolic CHF secondary to NICM and severe MR now s/p MV repair, rheumatoid arthritis, schizophrenia, and PUD.     Echo in 6/13 showed EF 30-35% with no MR and no significant mitral stenosis.    She was seen in HF clinic in 2/13 and admitted because of volume overload.  She was diuresed and discharged on torsemide 80 qam, 40 qpm.  She was re-admitted in 3/13 after overdosing on Seroquel with hypotension.  She was re-admitted again in 5/13 for ADHF after she quit taking her cardiac meds.  She had a St Jude ICD placed in 8/13.  She was taken off hydralazine when she was in the hospital for ICD b/c of low BP.   Admitted to [facility] 09/13/12 due to elevated creatinine (1.83). Diuresed with IV lasix and Milrinone. ARB discontinued.  Discharge creatinine 1.5. Discharge weight 378 pounds.  RHC 09/20/12  RA mean 10  RV 28/5  PA 23/10, mean 15  PCWP mean 17  Oxygen saturations:  PA 57%  AO 100%  Cardiac Output (Fick) 5.7  Cardiac Index (Fick) 2.17   She returns for follow up. She was seen on Monday with 20 pound weight gain and she was given 80 mg IV lasix for 2 days and Metolazone 2.5 mg for the las 2 days. Weight at home went down a couple of pounds to 398 pounds from 400 pounds.  Says she is feeling better. Denies SOB/ PND/dizziness. + Orthopnea (sleeps on 2-3 pillows). Drinks > 2 liters per day. Tries to follow low salt diet. No shocks. [facility] following. She is not using CPAP.      ROS: All pertinent positives and negatives as in HPI, otherwise negative  Allergies:  1) ! * Colchicine  2) ! Morphine    Past History:  1. Congestive heart failure, most likely secondary to severe mitral regurgitation. TEE (6/10): EF 40%, diffuse hypokinesis, mild to moderately dilated LV, severe eccentric MR, small PFO. Cardiac MRI  (6/10): EF 37% with global hypokinesis, moderate to severe MR, no delayed enhancement (no evidence for sarcoidosis or other infiltrative disease). TTE (10/10) after MV repair: EF 25-30%, moderately dilated LV, moderate diastolic dysfunction, mildly depressed RV function, trivial MR, MV mean gradient 5 mmHg, PASP 30 mmHg. RHC (12/10): mean RA 19, PA 46/26, mean PCWP 28, CI 2.5, SVO2 63%. TTE (2/11): EF 35-40% with diffuse hypokinesis, no significant MR or MS. TTE (7/11): EF 45%, mild global hypokinesis, s/p MV repair, no mitral regurgitation, minimal mitral stenosis, PA systolic pressure 40 mmHg, mild LV dilation.  TTE (11/12): EF 30-35%, mild LV dilation, mild LVH, s/p MV repair with no regurgitation and minimal stenosis.  TTE (3/13): EF 25-30%, diffuse hypokinesis, no significant mitral stenosis by pressure halftime with mean gradient 7 mmHg across valve, no MR.  Echo (6/13): EF 30-35%, mildly dilated LV, mild mitral stenosis but no MR.  St Jude ICD placed in 8/13.  2. Severe mitral regurgitation (see above). Minimally invasive mitral valve repair on 12/18/08. She additionally had TV repair and PFO closure.  3. Microcytic anemia: EGD and colonoscopy in 12/11 did not reveal source of bleeding.  4. H/o gastric ulcers.  5. Morbid obesity.  6. Obstructive sleep apnea, on CPAP.  7. GERD.  8. Hypertension, controlled.  9. Rheumatoid arthritis  10. Gout.  11. Depression.  12. LHC (6/10): No angiographic CAD. Mildly dilated LV. 3+ MR. EF 40%.  13. Prior smoking, quit  14. Colitis (2/11)  15. Schizophrenia versus schizoaffective disorder 16. History of right leg DVT    Current Outpatient Prescriptions  Medication Sig Dispense Refill  . amitriptyline (ELAVIL) 150 MG tablet Take 150 mg by mouth at bedtime.      Marland Kitchen aspirin EC 81 MG tablet Take 81 mg by mouth daily.      . carvedilol (COREG) 12.5 MG tablet Take 1.5 tablets (18.75 mg total) by mouth 2 (two) times daily with a meal.  90 tablet  2  .  esomeprazole (NEXIUM) 40 MG capsule Take 40 mg by mouth daily before breakfast.      . ferrous sulfate 325 (65 FE) MG tablet Take 1 tablet (325 mg total) by mouth 2 (two) times daily with a meal.  60 tablet  1  . hydrALAZINE (APRESOLINE) 25 MG tablet Take 37.5 mg by mouth 3 (three) times daily.      Marland Kitchen HYDROcodone-acetaminophen (NORCO) 7.5-325 MG per tablet Take 1-2 tablets by mouth every 6 (six) hours as needed for pain. Take 2 pills prior to physical therapy.  100 tablet  3  . isosorbide mononitrate (IMDUR) 60 MG 24 hr tablet Take 90 mg by mouth daily.      Marland Kitchen losartan (COZAAR) 25 MG tablet Take 1 tablet (25 mg total) by mouth daily.  30 tablet  6  . metolazone (ZAROXOLYN) 2.5 MG tablet Take 1 tablet (2.5 mg total) by mouth as needed.  6 tablet  3  . potassium chloride SA (K-DUR,KLOR-CON) 20 MEQ tablet Take 40 mEq by mouth 2 (two) times daily.      Marland Kitchen spironolactone (ALDACTONE) 25 MG tablet TAKE ONE TABLET BY MOUTH EVERY DAY  30 tablet  5  . torsemide (DEMADEX) 100 MG tablet Take 1 tablet (100 mg total) by mouth daily.  30 tablet  6  . [DISCONTINUED] QUEtiapine (SEROQUEL) 100 MG tablet Take 400 mg by mouth at bedtime.        No current facility-administered medications for this encounter.     Physical Exam:  Filed Vitals:   11/23/12 1006  BP: 88/1  Pulse: 98  Weight: 402 lb (182.346 kg)  SpO2: 95%    General: NAD, obese. Daughter present Neck: JVP hard to see but does appears better, no thyromegaly or thyroid nodule. No carotid bruit.  Lungs: Clear to auscultation bilaterally with normal respiratory effort. CV: Nondisplaced PMI.  Heart regular S1/S2, no S3/S4, no murmur.  Abdomen: Obese distended, nontender.  Neurologic: Alert and oriented x 3.  Psych: Normal affect. Extremities: No clubbing or cyanosis. Normal pedal pulses.  R and LLE 3+ does not extend to thighs.

## 2012-11-24 NOTE — Progress Notes (Signed)
Patient ID: [name], female   DOB: Sep 17, 1960, 52 y.o.   MRN: [id number] PCP: Dr Bosie Clos Primary Cardiologist: Dr. Shirlee Latch  HPI: [name] is a 52 yo with a h/o morbid obesity, systolic CHF secondary to NICM and severe MR now s/p MV repair, rheumatoid arthritis, schizophrenia, and PUD.     Echo in 6/13 showed EF 30-35% with no MR and no significant mitral stenosis.    She was seen in HF clinic in 2/13 and admitted because of volume overload.  She was diuresed and discharged on torsemide 80 qam, 40 qpm.  She was re-admitted in 3/13 after overdosing on Seroquel with hypotension.  She was re-admitted again in 5/13 for ADHF after she quit taking her cardiac meds.  She had a St Jude ICD placed in 8/13.  She was taken off hydralazine when she was in the hospital for ICD b/c of low BP.   Admitted to [facility] 09/13/12 due to elevated creatinine (1.83). Diuresed with IV lasix and Milrinone. ARB discontinued.  Discharge creatinine 1.5. Discharge weight 378 pounds.  She returns for an acute work in due to increased weight. Yesterday she was instructed to take Metolazone.  Denies SOB/ PND. + Orthopnea (sleeps on 2-3 pillows). Complains of knee pain. Weight at home trending up from 388 up to 400 pounds. Drinks > 2 liters per day. Tries to follow low salt diet. No shocks. [facility] following. She is not using CPAP.     Past History:  1. Congestive heart failure, most likely secondary to severe mitral regurgitation. TEE (6/10): EF 40%, diffuse hypokinesis, mild to moderately dilated LV, severe eccentric MR, small PFO. Cardiac MRI (6/10): EF 37% with global hypokinesis, moderate to severe MR, no delayed enhancement (no evidence for sarcoidosis or other infiltrative disease). TTE (10/10) after MV repair: EF 25-30%, moderately dilated LV, moderate diastolic dysfunction, mildly depressed RV function, trivial MR, MV mean gradient 5 mmHg, PASP 30 mmHg. RHC (12/10): mean RA 19, PA 46/26, mean PCWP 28, CI 2.5, SVO2 63%. TTE  (2/11): EF 35-40% with diffuse hypokinesis, no significant MR or MS. TTE (7/11): EF 45%, mild global hypokinesis, s/p MV repair, no mitral regurgitation, minimal mitral stenosis, PA systolic pressure 40 mmHg, mild LV dilation.  TTE (11/12): EF 30-35%, mild LV dilation, mild LVH, s/p MV repair with no regurgitation and minimal stenosis.  TTE (3/13): EF 25-30%, diffuse hypokinesis, no significant mitral stenosis by pressure halftime with mean gradient 7 mmHg across valve, no MR.  Echo (6/13): EF 30-35%, mildly dilated LV, mild mitral stenosis but no MR.  St Jude ICD placed in 8/13.  2. Severe mitral regurgitation (see above). Minimally invasive mitral valve repair on 12/18/08. She additionally had TV repair and PFO closure.  3. Microcytic anemia: EGD and colonoscopy in 12/11 did not reveal source of bleeding.  4. H/o gastric ulcers.  5. Morbid obesity.  6. Obstructive sleep apnea, on CPAP.  7. GERD.  8. Hypertension, controlled.  9. Rheumatoid arthritis  10. Gout.  11. Depression.  12. LHC (6/10): No angiographic CAD. Mildly dilated LV. 3+ MR. EF 40%.  13. Prior smoking, quit  14. Colitis (2/11)  15. Schizophrenia versus schizoaffective disorder 16. History of right leg DVT    Current Outpatient Prescriptions  Medication Sig Dispense Refill  . amitriptyline (ELAVIL) 150 MG tablet Take 150 mg by mouth at bedtime.      Marland Kitchen aspirin EC 81 MG tablet Take 81 mg by mouth daily.      . carvedilol (  COREG) 12.5 MG tablet Take 1.5 tablets (18.75 mg total) by mouth 2 (two) times daily with a meal.  90 tablet  2  . esomeprazole (NEXIUM) 40 MG capsule Take 40 mg by mouth daily before breakfast.      . ferrous sulfate 325 (65 FE) MG tablet Take 1 tablet (325 mg total) by mouth 2 (two) times daily with a meal.  60 tablet  1  . hydrALAZINE (APRESOLINE) 25 MG tablet Take 37.5 mg by mouth 3 (three) times daily.      Marland Kitchen HYDROcodone-acetaminophen (NORCO) 7.5-325 MG per tablet Take 1-2 tablets by mouth every 6 (six)  hours as needed for pain. Take 2 pills prior to physical therapy.  100 tablet  3  . isosorbide mononitrate (IMDUR) 60 MG 24 hr tablet Take 90 mg by mouth daily.      Marland Kitchen losartan (COZAAR) 25 MG tablet Take 1 tablet (25 mg total) by mouth daily.  30 tablet  6  . metolazone (ZAROXOLYN) 2.5 MG tablet Take 1 tablet (2.5 mg total) by mouth as needed.  6 tablet  3  . potassium chloride SA (K-DUR,KLOR-CON) 20 MEQ tablet Take 40 mEq by mouth 2 (two) times daily.      Marland Kitchen spironolactone (ALDACTONE) 25 MG tablet TAKE ONE TABLET BY MOUTH EVERY DAY  30 tablet  5  . torsemide (DEMADEX) 100 MG tablet Take 1 tablet (100 mg total) by mouth daily.  30 tablet  6  . [DISCONTINUED] QUEtiapine (SEROQUEL) 100 MG tablet Take 400 mg by mouth at bedtime.        No current facility-administered medications for this encounter.     Physical Exam:  Filed Vitals:   11/20/12 0959  BP: 106/68  Pulse: 105  Weight: 402 lb (182.346 kg)  SpO2: 96%    General: NAD, obese. Daughter present Neck: JVP hard to see but does appear  elevated, no thyromegaly or thyroid nodule. No carotid bruit.  Lungs: Clear to auscultation bilaterally with normal respiratory effort. CV: Nondisplaced PMI.  Heart regular S1/S2, no S3/S4, no murmur.  Abdomen: Obese distended, nontender.  Neurologic: Alert and oriented x 3.  Psych: Normal affect. Extremities: No clubbing or cyanosis. Normal pedal pulses.  R and LLE 3+  edema into thigh. Tight.

## 2012-11-26 ENCOUNTER — Ambulatory Visit (HOSPITAL_COMMUNITY)
Admission: RE | Admit: 2012-11-26 | Discharge: 2012-11-26 | Disposition: A | Discharge status: Home / Self Care | Payer: Medicare Other | Source: Ambulatory Visit | Attending: Cardiology | Admitting: Cardiology

## 2012-11-26 ENCOUNTER — Encounter (HOSPITAL_COMMUNITY): Payer: Self-pay

## 2012-11-26 VITALS — BP 118/64 | HR 107 | Wt 399.4 lb

## 2012-11-26 DIAGNOSIS — R0989 Other specified symptoms and signs involving the circulatory and respiratory systems: Secondary | ICD-10-CM | POA: Insufficient documentation

## 2012-11-26 DIAGNOSIS — R0609 Other forms of dyspnea: Secondary | ICD-10-CM | POA: Insufficient documentation

## 2012-11-26 DIAGNOSIS — R0602 Shortness of breath: Secondary | ICD-10-CM

## 2012-11-26 DIAGNOSIS — N189 Chronic kidney disease, unspecified: Secondary | ICD-10-CM | POA: Insufficient documentation

## 2012-11-26 DIAGNOSIS — I5022 Chronic systolic (congestive) heart failure: Secondary | ICD-10-CM

## 2012-11-26 LAB — BASIC METABOLIC PANEL
BUN: 28 mg/dL — ABNORMAL HIGH (ref 6–23)
Chloride: 98 mEq/L (ref 96–112)
Creatinine, Ser: 1.46 mg/dL — ABNORMAL HIGH (ref 0.50–1.10)
GFR calc Af Amer: 47 mL/min — ABNORMAL LOW (ref >90)
Glucose, Bld: 140 mg/dL — ABNORMAL HIGH (ref 70–99)

## 2012-11-26 MED ORDER — METOLAZONE 2.5 MG PO TABS: ORAL_TABLET | ORAL | Status: DC

## 2012-11-26 NOTE — Addendum Note (Signed)
Encounter addended by: Laurey Morale, MD on: 11/26/2012  9:40 PM<BR>     Documentation filed: Notes Section

## 2012-11-26 NOTE — Patient Instructions (Addendum)
Take metolazone 2.5 mg 30 min before torsemide on Tuesday's and Friday's.  BMET and BMP 2 weeks  Follow up in 3 weeks.

## 2012-11-26 NOTE — Progress Notes (Addendum)
Patient ID: [name], female   DOB: 09-23-1960, 52 y.o.   MRN: [id number] PCP: Dr. Bosie Clos  52 yo with systolic CHF probably secondary to severe MR now s/p MV repair, rheumatoid arthritis, schizophrenia, and PUD presents for followup.  Last echo in 6/13 showed EF 30-35% with no MR and no significant mitral stenosis.  Patient was seen in CHF clinic in 2/13 and admitted because of volume overload.  She was diuresed and discharged on torsemide 80 qam, 40 qpm.  She was re-admitted in 3/13 after overdosing on Seroquel with hypotension.  She was re-admitted again in 5/13 after she quit taking her cardiac meds and became short of breath.  She was restarted on her meds and diuresed.  She had a St Jude ICD placed in 8/13.  She was admitted in 5/14 with acute on chronic systolic CHF and elevated creatinine.  She was put on IV milrinone and diuresed with IV Lasix.  RHC showed reasonable filling pressures after diuresis and CI 2.17.   At baseline, she is inactive due to knee pain bilaterally.  Recently, weight has trended up and she has been seen twice in the last couple of weeks in CHF clinic.  She was most recently given three days in a row of metolazone.  Compared to last appointment, weight is down 3 lbs.  She says that her breathing is stable to improved.  She is able to walk around her house without dyspnea. She sleeps on 3 pillows chronically.  She uses a walker outside the house.   No lightheadedness or syncope. No PND.    Labs (8/11) K 4.4, creatinine 1.1, BNP 145  Labs (9/11): K 3.9, creatinine 1.0, BNP 84, LFTs normal, HCT 29.3  Labs (10/11): K 4.0, creatinine 1.55 => 1.2, uric acid 13, TSH normal, BNP 75 => 172  Labs (11/11): BNP 110 => 105, creatinine 3.6 => 1.2 => 1.5, K 3.7  Labs (1/12): K 3.5, creatinine 1.2, BNP 70  Labs (2/12): LDL 105, HDL 39 Labs (6/12): 3.7, creatinine 1.12 Labs (11/12): K 4.8, creatinine 0.85, BNP 88 Labs (12/12): K 3.9, creatinine 1.1, BNP 32 Labs (3/13): K 4,  creatinine 0.85 Labs (5/13): K 4, creatinine 1.04 => 0.9, BNP 715 => 36, TSH normal Labs (8/13): K 4.4, creatinine 1.1 Labs (9/13): K 4.5, creatinine 1.15, BNP 76 Labs (11/13): K 4.3, creatinine 1.1, BNP 31 Labs (12/13): K 4.7, creatinine 1.3 Labs (5/14): K 4.2, creatinine 0.8 Labs (7/14): K 4.2, creatinine 1.79  Allergies:  1) ! * Colchicine  2) ! Morphine   Past History:  1. Congestive heart failure, most likely secondary to severe mitral regurgitation. TEE (6/10): EF 40%, diffuse hypokinesis, mild to moderately dilated LV, severe eccentric MR, small PFO. Cardiac MRI (6/10): EF 37% with global hypokinesis, moderate to severe MR, no delayed enhancement (no evidence for sarcoidosis or other infiltrative disease). TTE (10/10) after MV repair: EF 25-30%, moderately dilated LV, moderate diastolic dysfunction, mildly depressed RV function, trivial MR, MV mean gradient 5 mmHg, PASP 30 mmHg. RHC (12/10): mean RA 19, PA 46/26, mean PCWP 28, CI 2.5, SVO2 63%. TTE (2/11): EF 35-40% with diffuse hypokinesis, no significant MR or MS. TTE (7/11): EF 45%, mild global hypokinesis, s/p MV repair, no mitral regurgitation, minimal mitral stenosis, PA systolic pressure 40 mmHg, mild LV dilation.  TTE (11/12): EF 30-35%, mild LV dilation, mild LVH, s/p MV repair with no regurgitation and minimal stenosis.  TTE (3/13): EF 25-30%, diffuse hypokinesis, no significant mitral stenosis  by pressure halftime with mean gradient 7 mmHg across valve, no MR.  Echo (6/13): EF 30-35%, mildly dilated LV, mild mitral stenosis but no MR.  St Jude ICD placed in 8/13. RHC (5/14): mean RA 10, PA 23/10, mean PCWP 17, CI 2.17.   2. Severe mitral regurgitation (see above). Minimally invasive mitral valve repair on 12/18/08. She additionally had TV repair and PFO closure.  3. Microcytic anemia: EGD and colonoscopy in 12/11 did not reveal source of bleeding.  4. H/o gastric ulcers.  5. Morbid obesity.  6. Obstructive sleep apnea, on CPAP.   7. GERD.  8. Hypertension, controlled.  9. Rheumatoid arthritis  10. Gout.  11. Depression.  12. LHC (6/10): No angiographic CAD. Mildly dilated LV. 3+ MR. EF 40%.  13. Prior smoking, quit  14. Colitis (2/11)  15. Schizophrenia versus schizoaffective disorder 16. History of right leg DVT   Family History:  Negative for cardiac or renal disease.  No FH of Colon Cancer  Social History:  Single, 5 children. Unemployed.  Tobacco Use - Yes. Smoked < 1 ppd, quit 6/10.  Alcohol Use - no  Regular Exercise - no  Drug Use - no, hx crack-cocaine use  Daily Caffeine Use: 2 daily   Review of Systems  All systems reviewed and negative except as per HPI.  Current Outpatient Prescriptions  Medication Sig Dispense Refill  . amitriptyline (ELAVIL) 150 MG tablet Take 150 mg by mouth at bedtime.      Marland Kitchen aspirin EC 81 MG tablet Take 81 mg by mouth daily.      . carvedilol (COREG) 12.5 MG tablet Take 1.5 tablets (18.75 mg total) by mouth 2 (two) times daily with a meal.  90 tablet  2  . esomeprazole (NEXIUM) 40 MG capsule Take 40 mg by mouth daily before breakfast.      . ferrous sulfate 325 (65 FE) MG tablet Take 1 tablet (325 mg total) by mouth 2 (two) times daily with a meal.  60 tablet  1  . hydrALAZINE (APRESOLINE) 25 MG tablet Take 37.5 mg by mouth 3 (three) times daily.      Marland Kitchen HYDROcodone-acetaminophen (NORCO) 7.5-325 MG per tablet Take 1-2 tablets by mouth every 6 (six) hours as needed for pain. Take 2 pills prior to physical therapy.  100 tablet  3  . isosorbide mononitrate (IMDUR) 60 MG 24 hr tablet Take 90 mg by mouth daily.      Marland Kitchen losartan (COZAAR) 25 MG tablet Take 1 tablet (25 mg total) by mouth daily.  30 tablet  6  . metolazone (ZAROXOLYN) 2.5 MG tablet Take 2.5 mg 30 min before torsemide on every Tuesday and Friday.  10 tablet  3  . potassium chloride SA (K-DUR,KLOR-CON) 20 MEQ tablet Take 40 mEq by mouth 2 (two) times daily.      Marland Kitchen spironolactone (ALDACTONE) 25 MG tablet TAKE ONE  TABLET BY MOUTH EVERY DAY  30 tablet  5  . torsemide (DEMADEX) 100 MG tablet Take 1 tablet (100 mg total) by mouth daily.  30 tablet  6  . [DISCONTINUED] QUEtiapine (SEROQUEL) 100 MG tablet Take 400 mg by mouth at bedtime.        No current facility-administered medications for this encounter.    BP 118/64  Pulse 107  Wt 399 lb 6.4 oz (181.167 kg)  BMI 64.5 kg/m2  SpO2 97%  LMP 05/05/2012 General: NAD, obese.  Neck: Thick neck, JVP difficult but would estimate around 8-9 cm, no thyromegaly or  thyroid nodule.  Lungs: Clear to auscultation bilaterally with normal respiratory effort. CV: Nondisplaced PMI.  Heart regular S1/S2, no S3/S4, no murmur.  No edema.  No carotid bruit.  Normal pedal pulses.  Abdomen: Soft, nontender, no hepatosplenomegaly, no distention.  Neurologic: Alert and oriented x 3.  Psych: Normal affect. Extremities: No clubbing or cyanosis.   Assessment/Plan  Mitral valve disorders  Patient is s/p MV repair. No MR and only minimal MS on last echo. Chronic systolic heart failure  Nonischemic cardiomyopathy.  NYHA class III symptoms but stable. She is compliant with her meds.  Weight is down some since last appointment but still above baseline.  - Continue current regimen of torsemide 100 mg daily. - I am going to start her on metolazone 2.5 mg 30 minutes before torsemide twice a week.   - Continue current Coreg, hydralazine, imdur, losartan and spironolactone.  - BMET/BNP in 2 wks, followup in office in 3 wks.    OBESITY This is a major issue for her.  Exercise capacity is severely limited by knee pain (history of RA, will hopefully see a rheumatologist). I am not sure that she is going to be able to make much progress given her inability to exercise.  I would agree with considering gastric bypass. CKD Creatinine has started to trend higher again with diuresis.  I think that we will have to accept some increase in her creatinine in order to keep her on the drier side.   She may have to stop losartan again.  Will repeat BMET in 2 wk, as above.    [name] 11/26/2012

## 2012-11-27 MED ORDER — FERROUS SULFATE 325 (65 FE) MG PO TABS: 325.0000 mg | ORAL_TABLET | Freq: Two times a day (BID) | ORAL | Status: DC

## 2012-11-28 ENCOUNTER — Telehealth: Payer: Self-pay | Admitting: Cardiology

## 2012-11-28 NOTE — Telephone Encounter (Signed)
New problem   [name]/[facility] want you to know that pt's weight is down by 8lbs and medication is working.FYI

## 2012-11-30 ENCOUNTER — Encounter: Payer: Self-pay | Admitting: Internal Medicine

## 2012-12-03 ENCOUNTER — Telehealth: Payer: Self-pay | Admitting: *Deleted

## 2012-12-03 ENCOUNTER — Other Ambulatory Visit (HOSPITAL_COMMUNITY): Payer: Self-pay | Admitting: *Deleted

## 2012-12-03 MED ORDER — POTASSIUM CHLORIDE CRYS ER 20 MEQ PO TBCR: 40.0000 meq | EXTENDED_RELEASE_TABLET | Freq: Two times a day (BID) | ORAL | Status: DC
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
## 2012-12-10 ENCOUNTER — Telehealth: Payer: Self-pay | Admitting: *Deleted

## 2012-12-10 NOTE — Telephone Encounter (Addendum)
Call from [facility] with [facility] [phone] Nurse calls to report pt is having a hard time sleeping at night.  She is taking amitriptyline 150 mg but past few weeks she is not able to sleep.  She tried increase dose and that didn't help..    Would you consider changing to another med?

## 2012-12-11 ENCOUNTER — Other Ambulatory Visit: Payer: Self-pay | Admitting: Internal Medicine

## 2012-12-11 DIAGNOSIS — G4700 Insomnia, unspecified: Secondary | ICD-10-CM

## 2012-12-11 MED ORDER — ZOLPIDEM TARTRATE 5 MG PO TABS: 5.0000 mg | ORAL_TABLET | Freq: Every evening | ORAL | Status: DC | PRN

## 2012-12-11 NOTE — Telephone Encounter (Signed)
[name] informed and will call pt.

## 2012-12-11 NOTE — Telephone Encounter (Signed)
New Rx called in. Pt and nurse called, no answer/message left.

## 2012-12-11 NOTE — Telephone Encounter (Signed)
I d/c's amitriptyline and prescribed Ambien 5 mg po qhs #30 no refills. Please call pt and [facility] to inform on new script.

## 2012-12-17 ENCOUNTER — Telehealth (HOSPITAL_COMMUNITY): Payer: Self-pay | Admitting: *Deleted

## 2012-12-17 NOTE — Telephone Encounter (Signed)
[facility] called concerned about pt's wt gain, she states pt is 390 today and was 385 last Monday, VS stable BP 122/72, HR 98 o2 sat 98%, no dyspnea, increased edema in ankles, abd tight and distended, pt has been taking medications, Torsemide 100 mg daily Spiro 25 and Metolazone 2.5 mg on Tue and Fri, per Dr Gala Romney have pt take Metolazone today instead of tomorrow, RN will let her know

## 2012-12-19 ENCOUNTER — Ambulatory Visit (HOSPITAL_COMMUNITY)
Admission: RE | Admit: 2012-12-19 | Discharge: 2012-12-19 | Disposition: A | Discharge status: Home / Self Care | Payer: Medicare Other | Source: Ambulatory Visit | Attending: Internal Medicine | Admitting: Internal Medicine

## 2012-12-19 ENCOUNTER — Encounter (HOSPITAL_COMMUNITY): Payer: Self-pay

## 2012-12-19 VITALS — BP 104/76 | HR 101 | Wt 388.8 lb

## 2012-12-19 DIAGNOSIS — R0602 Shortness of breath: Secondary | ICD-10-CM

## 2012-12-19 DIAGNOSIS — R0609 Other forms of dyspnea: Secondary | ICD-10-CM | POA: Insufficient documentation

## 2012-12-19 DIAGNOSIS — N183 Chronic kidney disease, stage 3 unspecified: Secondary | ICD-10-CM | POA: Insufficient documentation

## 2012-12-19 DIAGNOSIS — R0989 Other specified symptoms and signs involving the circulatory and respiratory systems: Secondary | ICD-10-CM | POA: Insufficient documentation

## 2012-12-19 DIAGNOSIS — I5022 Chronic systolic (congestive) heart failure: Secondary | ICD-10-CM | POA: Insufficient documentation

## 2012-12-19 LAB — BASIC METABOLIC PANEL
BUN: 27 mg/dL — ABNORMAL HIGH (ref 6–23)
CO2: 32 mEq/L (ref 19–32)
Calcium: 9.7 mg/dL (ref 8.4–10.5)
Creatinine, Ser: 1.48 mg/dL — ABNORMAL HIGH (ref 0.50–1.10)

## 2012-12-19 NOTE — Progress Notes (Signed)
Patient ID: [name], female   DOB: 1960/11/09, 52 y.o.   MRN: [id number] PCP: Dr. Bosie Clos  52 yo with systolic CHF probably secondary to severe MR now s/p MV repair, rheumatoid arthritis, schizophrenia, and PUD presents for followup.  Last echo in 6/13 showed EF 30-35% with no MR and no significant mitral stenosis.  Patient was seen in CHF clinic in 2/13 and admitted because of volume overload.  She was diuresed and discharged on torsemide 80 qam, 40 qpm.  She was re-admitted in 3/13 after overdosing on Seroquel with hypotension.  She was re-admitted again in 5/13 after she quit taking her cardiac meds and became short of breath.  She was restarted on her meds and diuresed.  She had a St Jude ICD placed in 8/13.  She was admitted in 5/14 with acute on chronic systolic CHF and elevated creatinine.  She was put on IV milrinone and diuresed with IV Lasix.  RHC showed reasonable filling pressures after diuresis and CI 2.17.   Follow up: Last visit started metolazone 2.5 twice a week. Weight at home 387-390 lbs. Feeling pretty good, other than pain in knees. From last appt down 11 lbs. Denies SOB/orthopnea or CP. Can walk around her house without SOB. Uses a wheelchair and walker at home. Taking medications as prescribed. Following a low salt diet and drinking less than 2 L a day.   Labs (8/11) K 4.4, creatinine 1.1, BNP 145  Labs (9/11): K 3.9, creatinine 1.0, BNP 84, LFTs normal, HCT 29.3  Labs (10/11): K 4.0, creatinine 1.55 => 1.2, uric acid 13, TSH normal, BNP 75 => 172  Labs (11/11): BNP 110 => 105, creatinine 3.6 => 1.2 => 1.5, K 3.7  Labs (1/12): K 3.5, creatinine 1.2, BNP 70  Labs (2/12): LDL 105, HDL 39 Labs (6/12): 3.7, creatinine 1.12 Labs (11/12): K 4.8, creatinine 0.85, BNP 88 Labs (12/12): K 3.9, creatinine 1.1, BNP 32 Labs (3/13): K 4, creatinine 0.85 Labs (5/13): K 4, creatinine 1.04 => 0.9, BNP 715 => 36, TSH normal Labs (8/13): K 4.4, creatinine 1.1 Labs (9/13): K 4.5,  creatinine 1.15, BNP 76 Labs (11/13): K 4.3, creatinine 1.1, BNP 31 Labs (12/13): K 4.7, creatinine 1.3 Labs (5/14): K 4.2, creatinine 0.8 Labs (7/14): K 4.2, creatinine 1.79 Labs (7/14): K 3.9 creatinine 1.46  Allergies:  1) ! * Colchicine  2) ! Morphine   Past History:  1. Congestive heart failure, most likely secondary to severe mitral regurgitation. TEE (6/10): EF 40%, diffuse hypokinesis, mild to moderately dilated LV, severe eccentric MR, small PFO. Cardiac MRI (6/10): EF 37% with global hypokinesis, moderate to severe MR, no delayed enhancement (no evidence for sarcoidosis or other infiltrative disease). TTE (10/10) after MV repair: EF 25-30%, moderately dilated LV, moderate diastolic dysfunction, mildly depressed RV function, trivial MR, MV mean gradient 5 mmHg, PASP 30 mmHg. RHC (12/10): mean RA 19, PA 46/26, mean PCWP 28, CI 2.5, SVO2 63%. TTE (2/11): EF 35-40% with diffuse hypokinesis, no significant MR or MS. TTE (7/11): EF 45%, mild global hypokinesis, s/p MV repair, no mitral regurgitation, minimal mitral stenosis, PA systolic pressure 40 mmHg, mild LV dilation.  TTE (11/12): EF 30-35%, mild LV dilation, mild LVH, s/p MV repair with no regurgitation and minimal stenosis.  TTE (3/13): EF 25-30%, diffuse hypokinesis, no significant mitral stenosis by pressure halftime with mean gradient 7 mmHg across valve, no MR.  Echo (6/13): EF 30-35%, mildly dilated LV, mild mitral stenosis but no MR.  St  Jude ICD placed in 8/13. RHC (5/14): mean RA 10, PA 23/10, mean PCWP 17, CI 2.17.   2. Severe mitral regurgitation (see above). Minimally invasive mitral valve repair on 12/18/08. She additionally had TV repair and PFO closure.  3. Microcytic anemia: EGD and colonoscopy in 12/11 did not reveal source of bleeding.  4. H/o gastric ulcers.  5. Morbid obesity.  6. Obstructive sleep apnea, on CPAP.  7. GERD.  8. Hypertension, controlled.  9. Rheumatoid arthritis  10. Gout.  11. Depression.  12. LHC  (6/10): No angiographic CAD. Mildly dilated LV. 3+ MR. EF 40%.  13. Prior smoking, quit  14. Colitis (2/11)  15. Schizophrenia versus schizoaffective disorder 16. History of right leg DVT   Family History:  Negative for cardiac or renal disease.  No FH of Colon Cancer  Social History:  Single, 5 children. Unemployed.  Tobacco Use - Yes. Smoked < 1 ppd, quit 6/10.  Alcohol Use - no  Regular Exercise - no  Drug Use - no, hx crack-cocaine use  Daily Caffeine Use: 2 daily   Review of Systems  All systems reviewed and negative except as per HPI.  Current Outpatient Prescriptions  Medication Sig Dispense Refill  . aspirin EC 81 MG tablet Take 81 mg by mouth daily.      . carvedilol (COREG) 12.5 MG tablet Take 1.5 tablets (18.75 mg total) by mouth 2 (two) times daily with a meal.  90 tablet  2  . ferrous sulfate 325 (65 FE) MG tablet Take 1 tablet (325 mg total) by mouth 2 (two) times daily with a meal.  180 tablet  1  . hydrALAZINE (APRESOLINE) 25 MG tablet Take 37.5 mg by mouth 3 (three) times daily.      Marland Kitchen HYDROcodone-acetaminophen (NORCO) 7.5-325 MG per tablet Take 1-2 tablets by mouth every 6 (six) hours as needed for pain. Take 2 pills prior to physical therapy.  100 tablet  3  . isosorbide mononitrate (IMDUR) 60 MG 24 hr tablet Take 90 mg by mouth daily.      Marland Kitchen losartan (COZAAR) 25 MG tablet Take 1 tablet (25 mg total) by mouth daily.  30 tablet  6  . metolazone (ZAROXOLYN) 2.5 MG tablet Take 2.5 mg 30 min before torsemide on every Tuesday and Friday.  10 tablet  3  . NEXIUM 40 MG capsule TAKE ONE CAPSULE BY MOUTH EVERY DAY BEFORE  BREAKFAST  90 capsule  1  . potassium chloride SA (K-DUR,KLOR-CON) 20 MEQ tablet Take 2 tablets (40 mEq total) by mouth 2 (two) times daily.  120 tablet  3  . spironolactone (ALDACTONE) 25 MG tablet TAKE ONE TABLET BY MOUTH EVERY DAY  30 tablet  5  . torsemide (DEMADEX) 100 MG tablet Take 1 tablet (100 mg total) by mouth daily.  30 tablet  6  . zolpidem  (AMBIEN) 5 MG tablet Take 1 tablet (5 mg total) by mouth at bedtime as needed for sleep.  30 tablet  0  . [DISCONTINUED] QUEtiapine (SEROQUEL) 100 MG tablet Take 400 mg by mouth at bedtime.        No current facility-administered medications for this encounter.   Filed Vitals:   12/19/12 1513  BP: 104/76  Pulse: 101  Weight: 388 lb 12.8 oz (176.359 kg)  SpO2: 97%  Last weight: 399    LMP 05/05/2012 General: NAD, obese.  Neck: Thick neck, JVP difficult to assess d/t body habitus but would estimate around 7-8, no thyromegaly or thyroid nodule.  Lungs: Clear to auscultation bilaterally CV: Nondisplaced PMI.  Heart regular S1/S2, no S3/S4, no murmur.  No edema.  No carotid bruit.  Normal pedal pulses.  Abdomen: Soft, nontender, no hepatosplenomegaly, no distention.  Neurologic: Alert and oriented x 3.  Psych: Normal affect. Extremities: No clubbing or cyanosis.   Assessment/Plan  Mitral valve disorders  Patient is s/p MV repair. No MR and only minimal MS on last echo. Chronic systolic heart failure  Nonischemic cardiomyopathy.  NYHA class II/IIb symptoms but stable. She is compliant with her meds.  Weight is down 11 lbs since last visit. - Will continue current regimen of torsemide 100 mg daily and metolazone 2.5 mg twice a week.   - Continue current Coreg, hydralazine, imdur, losartan and spironolactone, will not titrate any meds with soft BP today. - Will get BMET and BNP today. - F/U month with Dr. Shirlee Latch     OBESITY This is a major issue for her.  Exercise capacity is severely limited by knee pain (history of RA, will hopefully see a rheumatologist). I am not sure that she is going to be able to make much progress given her inability to exercise.  I would agree with considering gastric bypass.  CKD Last Cr stable, will recheck today.     [name] 12/19/2012 8:46 PM

## 2012-12-19 NOTE — Patient Instructions (Addendum)
Doing great.  Call if weight starts trending back up.  Will call with lab results.  F/U month with Dr. Shirlee Latch

## 2012-12-25 ENCOUNTER — Telehealth (HOSPITAL_COMMUNITY): Payer: Self-pay | Admitting: Anesthesiology

## 2012-12-25 NOTE — Telephone Encounter (Signed)
Message from [facility] that patient called with increased pain in lower back and weight gain of 5 lbs. She is not complaining of any SOB. Today was her scheduled metolazone day which she took 2.5 mg. Instructed to call back tomorrow or Thursday if weight is not coming down. No s/s of UTI denies dysuria, fevers or chills. If pain continues instructed to call PCP.  [name] B 5:14 PM

## 2012-12-25 NOTE — Telephone Encounter (Signed)
Pt states she is having severe lower back pain. Pt states weight yesterday 387 lbs and today weight is 392 lbs. She denies increase in SOB but is concerned about back pain and increase in weight. Pt seen in [facility] 12/19/12.

## 2012-12-25 NOTE — Telephone Encounter (Signed)
I will forward to [facility].

## 2012-12-25 NOTE — Telephone Encounter (Signed)
New problem   Beth/[facility] stated pt is having middle lower back pain she took her dieretic this morning and she put out 5,000 units of urine. Please call [employer]

## 2012-12-26 NOTE — Telephone Encounter (Signed)
See phone note from [facility] 12/25/12

## 2012-12-27 ENCOUNTER — Telehealth (HOSPITAL_COMMUNITY): Payer: Self-pay | Admitting: Cardiology

## 2012-12-27 NOTE — Telephone Encounter (Signed)
Beth called to report pts weight has increased back to 392 and increased SOB. Pt was told to take metolazone on 12/24/12 Per vo [name], NP ok for her to have another Metolazone today even though its a day early. Order changed to three times per week, Beth aware

## 2012-12-31 ENCOUNTER — Telehealth: Payer: Self-pay | Admitting: Cardiology

## 2012-12-31 NOTE — Telephone Encounter (Signed)
New problem    1. Weight down 383. Over the weekend. Gain 5 lbs up to  390. Took her metolazone today .  2. re certification order - 9/1 is the last day.  3. Patient is referral back over to gastric bypass MD. At that time she only had medicade. Now medicare as well.

## 2013-01-01 NOTE — Telephone Encounter (Signed)
Spoke w/pt today wt down to 381 lbs, she will let us know if increases again, spoke w/Beth and gave order to recert pt

## 2013-01-04 ENCOUNTER — Telehealth: Payer: Self-pay | Admitting: Cardiology

## 2013-01-04 NOTE — Telephone Encounter (Signed)
New Prob  Pt states when she takes the mediation metolazone 2.5 MG, it makes her stop very upset. She wants to know what can she do for it.

## 2013-01-04 NOTE — Telephone Encounter (Signed)
Spoke with patient and she is going to start taking the medication with food and not an empty stomach.  She will let the office know if this does not help

## 2013-01-07 ENCOUNTER — Encounter: Payer: Self-pay | Admitting: Internal Medicine

## 2013-01-07 ENCOUNTER — Ambulatory Visit (INDEPENDENT_AMBULATORY_CARE_PROVIDER_SITE_OTHER): Payer: Medicare Other | Admitting: *Deleted

## 2013-01-07 DIAGNOSIS — Z95810 Presence of automatic (implantable) cardiac defibrillator: Secondary | ICD-10-CM

## 2013-01-07 DIAGNOSIS — I509 Heart failure, unspecified: Secondary | ICD-10-CM

## 2013-01-07 DIAGNOSIS — I5023 Acute on chronic systolic (congestive) heart failure: Secondary | ICD-10-CM

## 2013-01-07 DIAGNOSIS — I428 Other cardiomyopathies: Secondary | ICD-10-CM

## 2013-01-08 ENCOUNTER — Ambulatory Visit (INDEPENDENT_AMBULATORY_CARE_PROVIDER_SITE_OTHER): Payer: Medicare Other | Admitting: Cardiology

## 2013-01-08 ENCOUNTER — Encounter: Payer: Self-pay | Admitting: Cardiology

## 2013-01-08 VITALS — BP 132/68 | HR 90 | Ht 66.0 in | Wt 387.0 lb

## 2013-01-08 DIAGNOSIS — I5023 Acute on chronic systolic (congestive) heart failure: Secondary | ICD-10-CM

## 2013-01-08 DIAGNOSIS — N189 Chronic kidney disease, unspecified: Secondary | ICD-10-CM

## 2013-01-08 DIAGNOSIS — I5022 Chronic systolic (congestive) heart failure: Secondary | ICD-10-CM

## 2013-01-08 DIAGNOSIS — I509 Heart failure, unspecified: Secondary | ICD-10-CM

## 2013-01-08 DIAGNOSIS — R0602 Shortness of breath: Secondary | ICD-10-CM

## 2013-01-08 LAB — REMOTE ICD DEVICE
BMOD-0002RV: 8
DEVICE MODEL ICD: 1022457
HV IMPEDENCE: 82 Ohm
RV LEAD AMPLITUDE: 12 mv
RV LEAD IMPEDENCE ICD: 530 Ohm
TZAT-0001SLOWVT: 1
TZAT-0012SLOWVT: 200 ms
TZAT-0013SLOWVT: 3
TZAT-0020SLOWVT: 1 ms
TZST-0001SLOWVT: 2
TZST-0001SLOWVT: 4
TZST-0001SLOWVT: 5
TZST-0003SLOWVT: 800 V
TZST-0003SLOWVT: 875 V
VENTRICULAR PACING ICD: 0 pct

## 2013-01-08 MED ORDER — HYDRALAZINE HCL 50 MG PO TABS: 50.0000 mg | ORAL_TABLET | Freq: Three times a day (TID) | ORAL | Status: DC

## 2013-01-08 NOTE — Patient Instructions (Addendum)
Increase hydralazine to 50mg  three times a day.   Continue to take metolazone (zaroxlyn) three times a week.   Your physician recommends that you return for lab work have lab today--BMET/BNP.  The phone number for the [facility] is [phone].  Your physician recommends that you schedule a follow-up appointment in: [facility] in 2 weeks.

## 2013-01-09 LAB — BASIC METABOLIC PANEL
BUN: 39 mg/dL — ABNORMAL HIGH (ref 6–23)
Calcium: 9.3 mg/dL (ref 8.4–10.5)
Creatinine, Ser: 1.8 mg/dL — ABNORMAL HIGH (ref 0.4–1.2)
GFR: 37.1 mL/min — ABNORMAL LOW (ref >60.00)
Glucose, Bld: 105 mg/dL — ABNORMAL HIGH (ref 70–99)
Sodium: 134 mEq/L — ABNORMAL LOW (ref 135–145)

## 2013-01-09 NOTE — Progress Notes (Signed)
Patient ID: [name], female   DOB: [birth year], 52 y.o.   MRN: [id number] PCP: Dr. Bosie Clos  52 yo with systolic CHF probably secondary to severe MR now s/p MV repair, rheumatoid arthritis, schizophrenia, and PUD presents for followup.  Last echo in 6/13 showed EF 30-35% with no MR and no significant mitral stenosis.  Patient was seen in CHF clinic in 2/13 and admitted because of volume overload.  She was diuresed and discharged on torsemide 80 qam, 40 qpm.  She was re-admitted in 3/13 after overdosing on Seroquel with hypotension.  She was re-admitted again in 5/13 after she quit taking her cardiac meds and became short of breath.  She was restarted on her meds and diuresed.  She had a St Jude ICD placed in 8/13.  She was admitted in 5/14 with acute on chronic systolic CHF and elevated creatinine.  She was put on IV milrinone and diuresed with IV Lasix.  Losartan was stopped due to increased creatinine.  RHC showed reasonable filling pressures after diuresis and CI 2.17.   Symptoms are stable.  At baseline, she is inactive due to knee pain bilaterally.  She is taking all her medications.  Weight is down from 399 at CHF clinic to 387 today.  Her metolazone was recently increased to three times a week from twice a week.  She is not short of breath walking around her house.  She sleeps on 3 pillows chronically.  She uses a walker outside the house.   No lightheadedness or syncope. No PND.  She asks about getting into a bariatric program.   Labs (8/11) K 4.4, creatinine 1.1, BNP 145  Labs (9/11): K 3.9, creatinine 1.0, BNP 84, LFTs normal, HCT 29.3  Labs (10/11): K 4.0, creatinine 1.55 => 1.2, uric acid 13, TSH normal, BNP 75 => 172  Labs (11/11): BNP 110 => 105, creatinine 3.6 => 1.2 => 1.5, K 3.7  Labs (1/12): K 3.5, creatinine 1.2, BNP 70  Labs (2/12): LDL 105, HDL 39 Labs (6/12): 3.7, creatinine 1.12 Labs (11/12): K 4.8, creatinine 0.85, BNP 88 Labs (12/12): K 3.9, creatinine 1.1, BNP  32 Labs (3/13): K 4, creatinine 0.85 Labs (5/13): K 4, creatinine 1.04 => 0.9, BNP 715 => 36, TSH normal Labs (8/13): K 4.4, creatinine 1.1 Labs (9/13): K 4.5, creatinine 1.15, BNP 76 Labs (11/13): K 4.3, creatinine 1.1, BNP 31 Labs (12/13): K 4.7, creatinine 1.3 Labs (5/14): K 4.2, creatinine 0.8 Labs (8/14): K 4, creatinine 1.48  ECG: NSR, inferior and anterolateral T wave inversions  Allergies:  1) ! * Colchicine  2) ! Morphine   Past History:  1. Congestive heart failure, most likely secondary to severe mitral regurgitation. TEE (6/10): EF 40%, diffuse hypokinesis, mild to moderately dilated LV, severe eccentric MR, small PFO. Cardiac MRI (6/10): EF 37% with global hypokinesis, moderate to severe MR, no delayed enhancement (no evidence for sarcoidosis or other infiltrative disease). TTE (10/10) after MV repair: EF 25-30%, moderately dilated LV, moderate diastolic dysfunction, mildly depressed RV function, trivial MR, MV mean gradient 5 mmHg, PASP 30 mmHg. RHC (12/10): mean RA 19, PA 46/26, mean PCWP 28, CI 2.5, SVO2 63%. TTE (2/11): EF 35-40% with diffuse hypokinesis, no significant MR or MS. TTE (7/11): EF 45%, mild global hypokinesis, s/p MV repair, no mitral regurgitation, minimal mitral stenosis, PA systolic pressure 40 mmHg, mild LV dilation.  TTE (11/12): EF 30-35%, mild LV dilation, mild LVH, s/p MV repair with no regurgitation and minimal stenosis.  TTE (3/13): EF 25-30%, diffuse hypokinesis, no significant mitral stenosis by pressure halftime with mean gradient 7 mmHg across valve, no MR.  Echo (6/13): EF 30-35%, mildly dilated LV, mild mitral stenosis but no MR.  St Jude ICD placed in 8/13. RHC (5/14): mean RA 10, PA 23/10, mean PCWP 17, CI 2.17.   2. Severe mitral regurgitation (see above). Minimally invasive mitral valve repair on 12/18/08. She additionally had TV repair and PFO closure.  3. Microcytic anemia: EGD and colonoscopy in 12/11 did not reveal source of bleeding.  4. H/o  gastric ulcers.  5. Morbid obesity.  6. Obstructive sleep apnea, on CPAP.  7. GERD.  8. Hypertension, controlled.  9. Rheumatoid arthritis  10. Gout.  11. Depression.  12. LHC (6/10): No angiographic CAD. Mildly dilated LV. 3+ MR. EF 40%.  13. Prior smoking, quit  14. Colitis (2/11)  15. Schizophrenia versus schizoaffective disorder 16. History of right leg DVT   Family History:  Negative for cardiac or renal disease.  No FH of Colon Cancer  Social History:  Single, 5 children. Unemployed.  Tobacco Use - Yes. Smoked < 1 ppd, quit 6/10.  Alcohol Use - no  Regular Exercise - no  Drug Use - no, hx crack-cocaine use  Daily Caffeine Use: 2 daily   Review of Systems  All systems reviewed and negative except as per HPI.  Current Outpatient Prescriptions  Medication Sig Dispense Refill  . aspirin EC 81 MG tablet Take 81 mg by mouth daily.      . carvedilol (COREG) 12.5 MG tablet Take 1.5 tablets (18.75 mg total) by mouth 2 (two) times daily with a meal.  90 tablet  2  . ferrous sulfate 325 (65 FE) MG tablet Take 1 tablet (325 mg total) by mouth 2 (two) times daily with a meal.  180 tablet  1  . HYDROcodone-acetaminophen (NORCO) 7.5-325 MG per tablet Take 1-2 tablets by mouth every 6 (six) hours as needed for pain. Take 2 pills prior to physical therapy.  100 tablet  3  . isosorbide mononitrate (IMDUR) 60 MG 24 hr tablet Take 90 mg by mouth daily.      Marland Kitchen losartan (COZAAR) 25 MG tablet Take 1 tablet (25 mg total) by mouth daily.  30 tablet  6  . metolazone (ZAROXOLYN) 2.5 MG tablet Take 2.5 mg by mouth. Take 2.5 mg 30 min before torsemide on every Monday Wednesday and Friday.      Marland Kitchen NEXIUM 40 MG capsule TAKE ONE CAPSULE BY MOUTH EVERY DAY BEFORE  BREAKFAST  90 capsule  1  . potassium chloride SA (K-DUR,KLOR-CON) 20 MEQ tablet Take 2 tablets (40 mEq total) by mouth 2 (two) times daily.  120 tablet  3  . spironolactone (ALDACTONE) 25 MG tablet TAKE ONE TABLET BY MOUTH EVERY DAY  30 tablet   5  . torsemide (DEMADEX) 100 MG tablet Take 1 tablet (100 mg total) by mouth daily.  30 tablet  6  . zolpidem (AMBIEN) 5 MG tablet Take 1 tablet (5 mg total) by mouth at bedtime as needed for sleep.  30 tablet  0  . hydrALAZINE (APRESOLINE) 50 MG tablet Take 1 tablet (50 mg total) by mouth 3 (three) times daily.  90 tablet  6  . [DISCONTINUED] QUEtiapine (SEROQUEL) 100 MG tablet Take 400 mg by mouth at bedtime.        No current facility-administered medications for this visit.    BP 132/68  Pulse 90  Ht 5'  6" (1.676 m)  Wt 175.542 kg (387 lb)  BMI 62.49 kg/m2  LMP 05/05/2012 General: NAD, obese.  Neck: Thick neck, JVP 8 cm, no thyromegaly or thyroid nodule.  Lungs: Clear to auscultation bilaterally with normal respiratory effort. CV: Nondisplaced PMI.  Heart regular S1/S2, no S3/S4, no murmur.  1+ edema to 1/2 to knees bilaterally.  No carotid bruit.  Normal pedal pulses.  Abdomen: Soft, nontender, no hepatosplenomegaly, no distention.  Neurologic: Alert and oriented x 3.  Psych: Normal affect. Extremities: No clubbing or cyanosis.   Assessment/Plan  Mitral valve disorders  Patient is s/p MV repair. No MR and only minimal MS on last echo. Chronic systolic heart failure  Nonischemic cardiomyopathy.  NYHA class III symptoms but stable. She still has some volume overload but weight is trending down and metolazone was recently increased. - Continue current regimen of torsemide 100 mg daily and metolazone 2.5 mg three time a week. - Continue current Coreg, imdur, losartan, and spironolactone.  - Can increase hydralazine to 50 mg tid.  CKD Creatinine in general has trended up with the need for more diuresis.  Will follow carefully.    OBESITY This is a major issue for her.  Exercise capacity is severely limited by knee pain (history of RA, will hopefully see a rheumatologist). I am not sure that she is going to be able to make much progress given her inability to exercise.  I would  agree with considering gastric bypass, she is going to try to get in the bariatric program at [facility].   [name] 01/09/2013

## 2013-01-15 ENCOUNTER — Telehealth: Payer: Self-pay | Admitting: Cardiology

## 2013-01-15 ENCOUNTER — Encounter: Payer: Self-pay | Admitting: *Deleted

## 2013-01-15 ENCOUNTER — Other Ambulatory Visit: Payer: Self-pay | Admitting: *Deleted

## 2013-01-15 DIAGNOSIS — I5023 Acute on chronic systolic (congestive) heart failure: Secondary | ICD-10-CM

## 2013-01-15 NOTE — Telephone Encounter (Signed)
Follow up  Pt states she made a mistake and she was still taking the Losartan. She said she spoke with you on Friday when you called her regarding her lab results. She asked if you could give her a call back when you get a chance.

## 2013-01-15 NOTE — Telephone Encounter (Signed)
Katina Dung RN spoke with patient this am. She stopped losartan on Friday and will come in for a repeat BMET.

## 2013-01-17 ENCOUNTER — Telehealth: Payer: Self-pay | Admitting: *Deleted

## 2013-01-17 ENCOUNTER — Telehealth: Payer: Self-pay | Admitting: Cardiology

## 2013-01-17 ENCOUNTER — Encounter: Payer: Self-pay | Admitting: Cardiology

## 2013-01-17 NOTE — Telephone Encounter (Signed)
Left Beth at [facility]  a message to call back.

## 2013-01-17 NOTE — Telephone Encounter (Signed)
[name] calls and leaves a message for md to check pt's L lower leg for several fluid filled blisters, per pt they have been on leg for appr 3 days

## 2013-01-17 NOTE — Telephone Encounter (Signed)
[facility] called, because she came to see the pt today.  Pt is scheduled to have BMP tomorrow 01/18/13. Pt would like to have blood work done by the visiting nurse . Beth [facility] will draw the blood and will fax results to this office.

## 2013-01-17 NOTE — Telephone Encounter (Signed)
New problem   Beth/[facility] pt has new swelling at lowe extremities. On left side blisters that is draining. Pt has appt w/PCP 01/18/13. Pt wt is 390 which is up 5lbs.

## 2013-01-18 ENCOUNTER — Other Ambulatory Visit: Payer: Medicare Other

## 2013-01-18 ENCOUNTER — Encounter: Payer: Self-pay | Admitting: Internal Medicine

## 2013-01-18 ENCOUNTER — Other Ambulatory Visit (HOSPITAL_COMMUNITY): Payer: Self-pay | Admitting: Internal Medicine

## 2013-01-18 ENCOUNTER — Ambulatory Visit (HOSPITAL_COMMUNITY)
Admission: RE | Admit: 2013-01-18 | Discharge: 2013-01-18 | Disposition: A | Discharge status: Home / Self Care | Payer: Medicare Other | Source: Ambulatory Visit | Attending: Neurological Surgery | Admitting: Neurological Surgery

## 2013-01-18 ENCOUNTER — Ambulatory Visit (INDEPENDENT_AMBULATORY_CARE_PROVIDER_SITE_OTHER): Payer: Medicare Other | Admitting: Internal Medicine

## 2013-01-18 VITALS — BP 116/75 | HR 98 | Temp 98.0°F | Ht 66.0 in | Wt 397.2 lb

## 2013-01-18 DIAGNOSIS — M7989 Other specified soft tissue disorders: Secondary | ICD-10-CM

## 2013-01-18 DIAGNOSIS — I872 Venous insufficiency (chronic) (peripheral): Secondary | ICD-10-CM | POA: Insufficient documentation

## 2013-01-18 DIAGNOSIS — I10 Essential (primary) hypertension: Secondary | ICD-10-CM

## 2013-01-18 DIAGNOSIS — R599 Enlarged lymph nodes, unspecified: Secondary | ICD-10-CM | POA: Insufficient documentation

## 2013-01-18 DIAGNOSIS — R0602 Shortness of breath: Secondary | ICD-10-CM | POA: Insufficient documentation

## 2013-01-18 NOTE — Assessment & Plan Note (Signed)
Patient presented w/ significant swelling of LLE when compared to the right, as well as blistering of the mid/lower shin. Blisters were tense, containing serous fluid. No purulence noted. +2 pitting edema noted, extending to the knee w/ tense skin and tenderness to palpation. DVT was suspected d/t LE asymmetry.  -Patient was sent from [facility] for LE doppler, which was found to be -ve for DVT at that time.  -Suspected LE changes d/t chronic venous insufficiency complicated by chronic CHF and fluid balance issues. Ordered home health referral for management of swelling and venous insufficiency as well as wound care for blisters.  -No cellulitis suspected at this time d/t general absence of significant erythema, fever, leukocytosis, and purulent drainage from blistered wound.  -Will have patient follow-up in ~3 weeks to determine further management of LLE swelling.

## 2013-01-18 NOTE — Telephone Encounter (Signed)
Saw patient today, addressed LE swelling and blisters at appointment.

## 2013-01-18 NOTE — Telephone Encounter (Signed)
See office note Dr Lars Masson 01/18/13

## 2013-01-18 NOTE — Progress Notes (Signed)
Subjective:   Patient ID: [name] female   DOB: 1960/06/30 52 y.o.   MRN: [id number]  HPI: [name] is a 52 y.o. F w/ PMHx of chronic systolic CHF/nonischemic/valvular cardiomyopathy (s/p AICD placement), CKD stage III,  hypertension, schizoaffective disorder, morbid obesity BMI greater than 60, degenerative joint disease of the knee presenting to the clinic today w/ complaints of LLE swelling and blistering of the shin. This has been going on for the past 4 days or so and is painful to the touch of the blistered area and she claims her left calf has been more tender than usual.  On exam, it is apparent that the patient's left calf is much larger than the right and warm to the touch. There is blistering over the shin region. Tenderness to palpation of the calf w/ +ve Pratt's and Homan's sign. Patient has had a DVT in the past (1990's) and was treated w/ Lovenox and Coumadin at that time. Has not had a DVT since then. No longer on anti-coagulants. LE doppler performed during [facility] visit, -ve for DVT. Patient has no other complaints at this time. She denies any recent SOB, chest pain, cough, nausea, vomiting, diarrhea, or constipation. She does have chronic knee pain which continues to be an issue for her, most likely 2/2 her weight. She has an appointment scheduled at the bariatric clinic at [facility] to discuss further weight management and possibility of gastric bypass.  Patient also seen by Dr. Shirlee Latch in CHF clinic on 01/09/13, CHF symptoms stable at this time. Previously admitted in 09/2012 for acute exacerbation of CHF.   Past Medical History  Diagnosis Date  . Severe mitral regurgitation     a. Minimally invasive MV repair on 12/18/08. Additionally TV repair  and PFO closure.  . Chronic systolic CHF (congestive heart failure)     a. 12/2011 Echo: EF 30%, diff HK, mild LVH, Gr 1 DD, mildly dil LA. b. 2/2 NICM.  Marland Kitchen Microcytic anemia     a. Small capsule endoscopy 2012 with gastric  erosion and small colonic AVMs. b. EGD and colonoscopy before that in 12/11 did not reveal source of bleeding)  . Gastric ulcer   . NICM (nonischemic cardiomyopathy)     a.12/2011: s/p SJM [id number] single lead ICD, Ser #: [id number]  . Obstructive sleep apnea     on CPAP  . GERD (gastroesophageal reflux disease)   . HTN (hypertension)   . Gout   . Depression   . Colitis 2/11  . Chronic back pain   . Chronic knee pain   . DVT (deep venous thrombosis)     right leg  . Anxiety   . Rheumatoid arthritis(714.0)     "shoulders & knees"  . Sleep disturbance     07/14/11 "haven't slept in 10 months; since going to [facility]"  . Hx: UTI (urinary tract infection)   . Nocturia   . COPD (chronic obstructive pulmonary disease)   . Bronchitis   . PTSD (post-traumatic stress disorder)   . Schizophrenia   . Dysmenorrhea   . Morbid obesity   . Acute renal insufficiency     a. ?Cardiorenal 09/2012.  Marland Kitchen Hyperglycemia    Current Outpatient Prescriptions  Medication Sig Dispense Refill  . aspirin EC 81 MG tablet Take 81 mg by mouth daily.      . carvedilol (COREG) 12.5 MG tablet Take 1.5 tablets (18.75 mg total) by mouth 2 (two) times daily with a meal.  90 tablet  2  . ferrous sulfate 325 (65 FE) MG tablet Take 1 tablet (325 mg total) by mouth 2 (two) times daily with a meal.  180 tablet  1  . hydrALAZINE (APRESOLINE) 50 MG tablet Take 1 tablet (50 mg total) by mouth 3 (three) times daily.  90 tablet  6  . HYDROcodone-acetaminophen (NORCO) 7.5-325 MG per tablet Take 1-2 tablets by mouth every 6 (six) hours as needed for pain. Take 2 pills prior to physical therapy.  100 tablet  3  . isosorbide mononitrate (IMDUR) 60 MG 24 hr tablet Take 90 mg by mouth daily.      . metolazone (ZAROXOLYN) 2.5 MG tablet Take 2.5 mg by mouth. Take 2.5 mg 30 min before torsemide on every Monday Wednesday and Friday.      Marland Kitchen NEXIUM 40 MG capsule TAKE ONE CAPSULE BY MOUTH EVERY DAY BEFORE  BREAKFAST  90 capsule  1  . potassium  chloride SA (K-DUR,KLOR-CON) 20 MEQ tablet Take 2 tablets (40 mEq total) by mouth 2 (two) times daily.  120 tablet  3  . spironolactone (ALDACTONE) 25 MG tablet TAKE ONE TABLET BY MOUTH EVERY DAY  30 tablet  5  . torsemide (DEMADEX) 100 MG tablet Take 1 tablet (100 mg total) by mouth daily.  30 tablet  6  . zolpidem (AMBIEN) 5 MG tablet Take 1 tablet (5 mg total) by mouth at bedtime as needed for sleep.  30 tablet  0  . [DISCONTINUED] QUEtiapine (SEROQUEL) 100 MG tablet Take 400 mg by mouth at bedtime.        No current facility-administered medications for this visit.   Family History  Problem Relation Age of Onset  . Hypertension Sister   . Alcoholism Father     died in his 35's or 100's  . Other Mother     alive & well @ 24.   History   Social History  . Marital Status: Single    Spouse Name: N/A    Number of Children: N/A  . Years of Education: N/A   Social History Main Topics  . Smoking status: Former Smoker -- 0.50 packs/day for 27 years    Types: Cigarettes    Quit date: 12/18/2008  . Smokeless tobacco: Never Used  . Alcohol Use: No     Comment: "stopped drinking alcohol 12/2003; did drink ~ 6 shots/wk"  . Drug Use: No     Comment: "last cocaine use 2009"  . Sexual Activity: Not Currently   Other Topics Concern  . Not on file   Social History Narrative   Pt lives in [place] with her dtr.  On disability.   Review of Systems: General: Denies fever, chills, diaphoresis, appetite change and fatigue.  HEENT: Denies change in vision, eye pain, redness, hearing loss, congestion, sore throat, rhinorrhea, sneezing, mouth sores, trouble swallowing, neck pain, neck stiffness and tinnitus.   Respiratory: Denies SOB, DOE, cough, chest tightness, and wheezing.   Cardiovascular: Positive for LLE swelling / w/ associated blistering of left shin. Denies chest pain, palpitations and leg swelling.  Gastrointestinal: Positive for heartburn. Denies nausea, vomiting, abdominal pain,  diarrhea, constipation, blood in stool and abdominal distention.  Genitourinary: Denies dysuria, urgency, frequency, hematuria, flank pain and difficulty urinating.  Endocrine: Denies hot or cold intolerance, sweats, polyuria, polydipsia. Musculoskeletal: Positive for joint pain, mostly in the knees. Denies myalgias, back pain, joint swelling, and gait problem.  Skin: Denies pallor, rash and wounds.  Neurological: Denies dizziness, seizures, syncope, weakness, lightheadedness, numbness  and headaches.  Hematological: Denies adenopathy,easy bruising, personal or family bleeding history.  Psychiatric/Behavioral: Denies mood changes, confusion, nervousness, sleep disturbance and agitation.  Objective:  Physical Exam: Filed Vitals:   01/18/13 0944  Pulse: 98  Temp: 98 F (36.7 C)  TempSrc: Oral  SpO2: 95%   General: Vital signs reviewed. Patient is a well-developed and well-nourished, in no acute distress and cooperative with exam. Morbidly obese. Head: Normocephalic and atraumatic. Nose: No erythema or drainage noted.  Turbinates normal. Mouth: No erythema, exudates, sores, or ulcerations. Moist mucus membranes. Eyes: PERRL, EOMI, conjunctivae normal, No scleral icterus.  Neck: Supple, trachea midline, normal ROM, No JVD, masses, thyromegaly, or carotid bruit present.  Cardiovascular: RRR, S1 normal, S2 normal, no murmurs, gallops, or rubs. Pulmonary/Chest: Normal respiratory effort, CTAB, no wheezes, rales, or rhonchi. Abdominal: Soft. Non-tender, non-distended, bowel sounds are normal, no masses, organomegaly, or guarding present.  Musculoskeletal: No joint deformities, erythema. Knees stiff w/ decreased passive ROM. Mild tenderness to palpation of the knee joints. Extremities: +1 pitting edema in the RLE, w/ +2-+3 pitting edema in the LLE w/ tense serous fluid filled blisters on the mid/lower shin. Skin is tense and warm to the touch w/ mild localized erythema in the area of the blisters.  No purulence. Tenderness to palpation of the L calf.  Neurological: A&O x3, Strength is normal and symmetric bilaterally, cranial nerve II-XII are grossly intact, no focal motor deficit, sensory intact to light touch bilaterally.  Skin: Warm, dry and intact. No rashes or erythema. Psychiatric: Normal mood and affect. speech and behavior is normal. Cognition and memory are normal.   Assessment & Plan:   Please see problem-based assessment and plan.

## 2013-01-18 NOTE — Progress Notes (Addendum)
Bilateral lower extremity venous duplex:  No evidence of DVT, superficial thrombosis, or Baker's Cyst.  There appears to be a non vascularized structure in the right groin, cyst or enlarged lymph node.

## 2013-01-18 NOTE — Patient Instructions (Addendum)
General Instructions: 1. Please come back in 3 weeks for follow up regarding your left leg swelling.  Home health will come to evaluate you for further management of your left leg and use of an [employer] P[employer]ase keep blisters clean and dry to prevent further complications.   2. Please take all medications as prescribed.  3. If you have worsening of your symptoms or new symptoms arise, please call the clinic ([phone]), or go to the ER immediately if symptoms are severe.  You have done great job in taking all your medications. I appreciate it very much. Please continue doing that.   Treatment Goals:  Goals (1 Years of Data) as of 01/18/13         As of Today 01/08/13 12/19/12 11/26/12 11/23/12     Blood Pressure    . Blood Pressure < 140/90  116/75 132/68 104/76 118/64 88/1     Lifestyle    . Prevent Falls            Progress Toward Treatment Goals:  Treatment Goal 01/18/2013  Blood pressure at goal    Self Care Goals & Plans:  Self Care Goal 01/18/2013  Manage my medications take my medicines as prescribed; bring my medications to every visit  Monitor my health keep track of my blood pressure; keep track of my weight  Eat healthy foods -  Be physically active -  Meeting treatment goals maintain the current self-care plan       Care Management & Community Referrals:  Referral 01/18/2013  Referrals made for care management support none needed  Referrals made to community resources none

## 2013-01-18 NOTE — Addendum Note (Signed)
Addended byCourtney Paris on: 01/18/2013 05:00 PM   Modules accepted: Orders

## 2013-01-18 NOTE — Assessment & Plan Note (Signed)
Blood pressure well controlled today. Continue w/ current BP regimen.

## 2013-01-19 NOTE — Progress Notes (Signed)
I saw and evaluated the patient.  I personally confirmed the key portions of the history and exam documented by Dr. Jones and I reviewed pertinent patient test results.  The assessment, diagnosis, and plan were formulated together and I agree with the documentation in the resident's note.   

## 2013-01-25 ENCOUNTER — Encounter (HOSPITAL_COMMUNITY): Payer: Medicare Other

## 2013-01-28 ENCOUNTER — Telehealth: Payer: Self-pay | Admitting: *Deleted

## 2013-01-28 NOTE — Telephone Encounter (Signed)
Beth from [facility] called to report that the patient is having some irregular heart beats, but her heart rate is ok. The patient reported this morning that she was doing some packing over the weekend and had an episode of SOB. They are trying to send a transmission off her device, but were having problems with that. Call transferred to [facility] in the device room to help. Will forward to [facility] to follow up.

## 2013-01-29 NOTE — Telephone Encounter (Signed)
Per Kristin--transmission received 01/28/13 indicates SR based on sinus rate and VVI mode. I spoke with patient today and she has no complaints except knee pain.

## 2013-01-31 ENCOUNTER — Other Ambulatory Visit: Payer: Self-pay | Admitting: *Deleted

## 2013-01-31 ENCOUNTER — Ambulatory Visit (HOSPITAL_COMMUNITY)
Admission: RE | Admit: 2013-01-31 | Discharge: 2013-01-31 | Disposition: A | Discharge status: Home / Self Care | Payer: Medicare Other | Source: Ambulatory Visit | Attending: Cardiology | Admitting: Cardiology

## 2013-01-31 ENCOUNTER — Other Ambulatory Visit: Payer: Self-pay | Admitting: Cardiology

## 2013-01-31 VITALS — BP 118/76 | HR 90 | Wt 392.2 lb

## 2013-01-31 DIAGNOSIS — I059 Rheumatic mitral valve disease, unspecified: Secondary | ICD-10-CM | POA: Insufficient documentation

## 2013-01-31 DIAGNOSIS — N189 Chronic kidney disease, unspecified: Secondary | ICD-10-CM | POA: Insufficient documentation

## 2013-01-31 DIAGNOSIS — S83259A Bucket-handle tear of lateral meniscus, current injury, unspecified knee, initial encounter: Secondary | ICD-10-CM

## 2013-01-31 DIAGNOSIS — I5022 Chronic systolic (congestive) heart failure: Secondary | ICD-10-CM | POA: Insufficient documentation

## 2013-01-31 DIAGNOSIS — E669 Obesity, unspecified: Secondary | ICD-10-CM | POA: Insufficient documentation

## 2013-01-31 DIAGNOSIS — M25561 Pain in right knee: Secondary | ICD-10-CM

## 2013-01-31 NOTE — Patient Instructions (Addendum)
Continue medications as prescribed.  Will follow up 1 month.  Restrict fluids to less than 2L a day.   Do the following things EVERYDAY: 1) Weigh yourself in the morning before breakfast. Write it down and keep it in a log. 2) Take your medicines as prescribed 3) Eat low salt foods-Limit salt (sodium) to 2000 mg per day.  4) Stay as active as you can everyday 5) Limit all fluids for the day to less than 2 liters 6)

## 2013-01-31 NOTE — Progress Notes (Signed)
Patient ID: [name], female   DOB: 09-06-1960, 52 y.o.   MRN: [id number] PCP: Dr. Bosie Clos  52 yo with systolic CHF probably secondary to severe MR now s/p MV repair, rheumatoid arthritis, schizophrenia, and PUD presents for followup.  Last echo in 6/13 showed EF 30-35% with no MR and no significant mitral stenosis.  Patient was seen in CHF clinic in 2/13 and admitted because of volume overload.  She was diuresed and discharged on torsemide 80 qam, 40 qpm.  She was re-admitted in 3/13 after overdosing on Seroquel with hypotension.  She was re-admitted again in 5/13 after she quit taking her cardiac meds and became short of breath.  She was restarted on her meds and diuresed.  She had a St Jude ICD placed in 8/13.  She was admitted in 5/14 with acute on chronic systolic CHF and elevated creatinine.  She was put on IV milrinone and diuresed with IV Lasix.  Losartan was stopped due to increased creatinine.  RHC showed reasonable filling pressures after diuresis and CI 2.17.   Follow up: Was seen by Dr Yetta Barre with increased LLE compared to the right and LE doppler was negative for DVT. Losartan stopped d/t increased creatinine. Having left leg and knee pain. Her blisters on her legs are improving. Taking all medications. Denies SOB or CP. Went to seminar on Gastric bypass and is hoping that they will accept her insurance. Symptoms are stable.  At baseline, she is inactive due to knee pain bilaterally. She sleeps on 3 pillows chronically.  She uses a walker outside the house.   No lightheadedness or syncope. No PND. Weight at home 392 lbs which is stable for her. Continues to drink an extensive amount of fluid and eat suckers as she says mouth is always dry.   Labs (8/11) K 4.4, creatinine 1.1, BNP 145  Labs (9/11): K 3.9, creatinine 1.0, BNP 84, LFTs normal, HCT 29.3  Labs (10/11): K 4.0, creatinine 1.55 => 1.2, uric acid 13, TSH normal, BNP 75 => 172  Labs (11/11): BNP 110 => 105, creatinine 3.6 =>  1.2 => 1.5, K 3.7  Labs (1/12): K 3.5, creatinine 1.2, BNP 70  Labs (2/12): LDL 105, HDL 39 Labs (6/12): 3.7, creatinine 1.12 Labs (11/12): K 4.8, creatinine 0.85, BNP 88 Labs (12/12): K 3.9, creatinine 1.1, BNP 32 Labs (3/13): K 4, creatinine 0.85 Labs (5/13): K 4, creatinine 1.04 => 0.9, BNP 715 => 36, TSH normal Labs (8/13): K 4.4, creatinine 1.1 Labs (9/13): K 4.5, creatinine 1.15, BNP 76 Labs (11/13): K 4.3, creatinine 1.1, BNP 31 Labs (12/13): K 4.7, creatinine 1.3 Labs (5/14): K 4.2, creatinine 0.8 Labs (8/14): K 4, creatinine 1.48 Labs (9/14): K 4.6, creatinine 1.72  Allergies:  1) ! * Colchicine  2) ! Morphine   Past History:  1. Congestive heart failure, most likely secondary to severe mitral regurgitation. TEE (6/10): EF 40%, diffuse hypokinesis, mild to moderately dilated LV, severe eccentric MR, small PFO. Cardiac MRI (6/10): EF 37% with global hypokinesis, moderate to severe MR, no delayed enhancement (no evidence for sarcoidosis or other infiltrative disease). TTE (10/10) after MV repair: EF 25-30%, moderately dilated LV, moderate diastolic dysfunction, mildly depressed RV function, trivial MR, MV mean gradient 5 mmHg, PASP 30 mmHg. RHC (12/10): mean RA 19, PA 46/26, mean PCWP 28, CI 2.5, SVO2 63%. TTE (2/11): EF 35-40% with diffuse hypokinesis, no significant MR or MS. TTE (7/11): EF 45%, mild global hypokinesis, s/p MV repair, no mitral regurgitation,  minimal mitral stenosis, PA systolic pressure 40 mmHg, mild LV dilation.  TTE (11/12): EF 30-35%, mild LV dilation, mild LVH, s/p MV repair with no regurgitation and minimal stenosis.  TTE (3/13): EF 25-30%, diffuse hypokinesis, no significant mitral stenosis by pressure halftime with mean gradient 7 mmHg across valve, no MR.  Echo (6/13): EF 30-35%, mildly dilated LV, mild mitral stenosis but no MR.  St Jude ICD placed in 8/13. RHC (5/14): mean RA 10, PA 23/10, mean PCWP 17, CI 2.17.   2. Severe mitral regurgitation (see above).  Minimally invasive mitral valve repair on 12/18/08. She additionally had TV repair and PFO closure.  3. Microcytic anemia: EGD and colonoscopy in 12/11 did not reveal source of bleeding.  4. H/o gastric ulcers.  5. Morbid obesity.  6. Obstructive sleep apnea, on CPAP.  7. GERD.  8. Hypertension, controlled.  9. Rheumatoid arthritis  10. Gout.  11. Depression.  12. LHC (6/10): No angiographic CAD. Mildly dilated LV. 3+ MR. EF 40%.  13. Prior smoking, quit  14. Colitis (2/11)  15. Schizophrenia versus schizoaffective disorder 16. History of right leg DVT   Family History:  Negative for cardiac or renal disease.  No FH of Colon Cancer  Social History:  Single, 5 children. Unemployed.  Tobacco Use - Yes. Smoked < 1 ppd, quit 6/10.  Alcohol Use - no  Regular Exercise - no  Drug Use - no, hx crack-cocaine use  Daily Caffeine Use: 2 daily   Review of Systems  All systems reviewed and negative except as per HPI.  Current Outpatient Prescriptions  Medication Sig Dispense Refill  . amitriptyline (ELAVIL) 150 MG tablet Take 150 mg by mouth at bedtime as needed for sleep.      Marland Kitchen aspirin EC 81 MG tablet Take 81 mg by mouth daily.      . carvedilol (COREG) 12.5 MG tablet TAKE ONE & ONE-HALF TABLETS BY MOUTH TWICE DAILY WTH A MEAL  90 tablet  0  . ferrous sulfate 325 (65 FE) MG tablet Take 1 tablet (325 mg total) by mouth 2 (two) times daily with a meal.  180 tablet  1  . hydrALAZINE (APRESOLINE) 50 MG tablet Take 1 tablet (50 mg total) by mouth 3 (three) times daily.  90 tablet  6  . HYDROcodone-acetaminophen (NORCO) 7.5-325 MG per tablet Take 1-2 tablets by mouth every 6 (six) hours as needed for pain. Take 2 pills prior to physical therapy.  100 tablet  3  . isosorbide mononitrate (IMDUR) 60 MG 24 hr tablet Take 90 mg by mouth daily.      . metolazone (ZAROXOLYN) 2.5 MG tablet Take 2.5 mg by mouth. Take 2.5 mg 30 min before torsemide on every Monday Wednesday and Friday.      Marland Kitchen NEXIUM 40  MG capsule TAKE ONE CAPSULE BY MOUTH EVERY DAY BEFORE  BREAKFAST  90 capsule  1  . potassium chloride SA (K-DUR,KLOR-CON) 20 MEQ tablet Take 2 tablets (40 mEq total) by mouth 2 (two) times daily.  120 tablet  3  . spironolactone (ALDACTONE) 25 MG tablet TAKE ONE TABLET BY MOUTH EVERY DAY  30 tablet  5  . torsemide (DEMADEX) 100 MG tablet Take 1 tablet (100 mg total) by mouth daily.  30 tablet  6  . [DISCONTINUED] QUEtiapine (SEROQUEL) 100 MG tablet Take 400 mg by mouth at bedtime.        No current facility-administered medications for this encounter.   Filed Vitals:   01/31/13 1549  BP: 118/76  Pulse: 90  Weight: 392 lb 4 oz (177.923 kg)  SpO2: 96%   General: NAD, obese. Sitting in WC Neck: Thick neck, JVP difficult to assess d/t body habitus, no thyromegaly or thyroid nodule.  Lungs: Clear to auscultation bilaterally with normal respiratory effort. CV: Nondisplaced PMI.  Heart regular S1/S2, no S3/S4, no murmur.   No carotid bruit.  Normal pedal pulses.  Abdomen: Soft, markedly obese. nontender, no distention.  Neurologic: Alert and oriented x 3. Moves all 4 without difficulty Psych: Normal affect. Extremities: No clubbing or cyanosis. 1+ edema to 1/2 to knees bilaterally. LLE wrapped in ACE  Assessment/Plan  Mitral valve disorders  Patient is s/p MV repair. No MR and only minimal MS on last echo.  Chronic systolic heart failure  Nonischemic cardiomyopathy.  NYHA class III symptoms but stable. Weight stable, will continue metolazone M, W, and Friday along with torsemide 100 mg daily. Long talk about the need to decrease fluid intake (again). Discussed the use of sliding scale diuretics. Reinforced the need to restrict fluids to less than 2L a day.  - Continue current Coreg, imdur, hydralazine and spironolactone.   CKD Losartan stopped and creatinine trending back down. May be able to restart at low dose in future. Will get BMET next week with [facility].     Morbid obesity Continues  to be a major issue for her. She attended a gastric bypass meeting and is proceeding with obtaining all her records in order to see if she can proceed with surgery.  Exercise capacity is severely limited by knee pain (history of RA, will hopefully see a rheumatologist). Counseled on need for dietary restriction. She says that she does not eat much but family members report that she will make a ginger cake and eat the whole thing in one day.  Daniel Bensimhon,MD 5:50 PM

## 2013-01-31 NOTE — Telephone Encounter (Signed)
Pharmacy did not deactivate pt's old script for #60 when you did a new script for #100, per the pharmacy she has not gotten any filled before 30 days and the last filled was #60, she should have gotten #100 per your last order, they will give her another 61 today and next refill will be 02/22/2013 but by then she will have to have a hard copy script because the law changes 10/6, so i am sending you a one time script for #40 and then a request for oct, nov and dec for #100 if you chose to continue the #100 you may print those 3 and deliver to triage or if after hours to the blue basket in the med room. Thank you, Ceola Para

## 2013-02-01 MED ORDER — HYDROCODONE-ACETAMINOPHEN 7.5-325 MG PO TABS: 1.0000 | ORAL_TABLET | Freq: Four times a day (QID) | ORAL | Status: DC | PRN

## 2013-02-01 NOTE — Telephone Encounter (Signed)
Called to pharmacy 

## 2013-02-05 ENCOUNTER — Telehealth: Payer: Self-pay | Admitting: *Deleted

## 2013-02-05 ENCOUNTER — Other Ambulatory Visit: Payer: Self-pay | Admitting: *Deleted

## 2013-02-05 DIAGNOSIS — M25561 Pain in right knee: Secondary | ICD-10-CM

## 2013-02-05 DIAGNOSIS — S83259A Bucket-handle tear of lateral meniscus, current injury, unspecified knee, initial encounter: Secondary | ICD-10-CM

## 2013-02-05 NOTE — Telephone Encounter (Signed)
Call from [facility] from [facility] -pt has a new ulcer on the back of her left leg.  They are continuing to do  the the [facility] for the patient.  Wanted to let you be aware.  New orders can be called to [phone].  Angelina Ok, RN 02/05/2013 2:00 PM

## 2013-02-05 NOTE — Telephone Encounter (Signed)
Pt would like to increase the amt, states she uses 2 at a time due to her severe knee pain

## 2013-02-06 MED ORDER — HYDROCODONE-ACETAMINOPHEN 7.5-325 MG PO TABS: 1.0000 | ORAL_TABLET | Freq: Four times a day (QID) | ORAL | Status: DC | PRN

## 2013-02-06 NOTE — Telephone Encounter (Signed)
Called to pharm 

## 2013-02-08 ENCOUNTER — Ambulatory Visit (INDEPENDENT_AMBULATORY_CARE_PROVIDER_SITE_OTHER): Payer: Medicare Other | Admitting: Internal Medicine

## 2013-02-08 ENCOUNTER — Encounter: Payer: Self-pay | Admitting: Internal Medicine

## 2013-02-08 VITALS — BP 113/73 | HR 91 | Temp 98.0°F | Ht 66.0 in | Wt 390.6 lb

## 2013-02-08 DIAGNOSIS — I5022 Chronic systolic (congestive) heart failure: Secondary | ICD-10-CM

## 2013-02-08 DIAGNOSIS — M7989 Other specified soft tissue disorders: Secondary | ICD-10-CM

## 2013-02-08 DIAGNOSIS — M25561 Pain in right knee: Secondary | ICD-10-CM

## 2013-02-08 DIAGNOSIS — M25569 Pain in unspecified knee: Secondary | ICD-10-CM

## 2013-02-08 DIAGNOSIS — I10 Essential (primary) hypertension: Secondary | ICD-10-CM

## 2013-02-08 LAB — BASIC METABOLIC PANEL
BUN: 36 mg/dL — ABNORMAL HIGH (ref 6–23)
Chloride: 96 mEq/L (ref 96–112)
Creat: 1.62 mg/dL — ABNORMAL HIGH (ref 0.50–1.10)
Glucose, Bld: 145 mg/dL — ABNORMAL HIGH (ref 70–99)
Potassium: 3.9 mEq/L (ref 3.5–5.3)

## 2013-02-08 NOTE — Progress Notes (Signed)
Patient ID: [name], female   DOB: 1960/10/07, 52 y.o.   MRN: [id number]   Subjective:   Patient ID: [name] female   DOB: 04-21-1961 52 y.o.   MRN: [id number]  HPI: [name] is a 53 y.o. F w/ PMHx of chronic systolic CHF/nonischemic/valvular cardiomyopathy (s/p AICD placement), CKD stage III,  hypertension, schizoaffective disorder, morbid obesity BMI greater than 60, degenerative joint disease of the knee presenting to the clinic today for 2 week follow up d/t recent LLE swelling.   At the last visit, the patient had swelling in the LLE with blistering of the skin and tenderness to touch. LE Doppler showed no DVT. Patient was sent home with [facility] care for LE venous insufficiency and she has been wearing an [name] since that time. She says her leg is much better than it was and does not appear larger than the right today. She also claims the tenderness and blistering has improved is as well.  Today the patient continues to complain about pain in her knees, which is a chronic issue for her, 2/2 to arthritis and morbid obesity. She claims the pain is severe enough to limit her ability to walk and exercise. The patient was recently seen at the Bariatric clinic at [facility] for discussion of bariatric surgery, however the patient has not exhausted other options for weight loss at this point.  Lastly she complains of worsening GERD symptoms, claiming that she has a sour taste in her mouth hours after eating and says her reflux causes her to "choke" at times. She is currently on Nexium 40 mg qd, which she takes just prior to her first meal of the day.   Patient has no other complaints at this time. She denies any recent SOB, chest pain, cough, nausea, vomiting, diarrhea, or constipation.  Past Medical History  Diagnosis Date  . Severe mitral regurgitation     a. Minimally invasive MV repair on 12/18/08. Additionally TV repair  and PFO closure.  . Chronic systolic CHF  (congestive heart failure)     a. 12/2011 Echo: EF 30%, diff HK, mild LVH, Gr 1 DD, mildly dil LA. b. 2/2 NICM.  Marland Kitchen Microcytic anemia     a. Small capsule endoscopy 2012 with gastric erosion and small colonic AVMs. b. EGD and colonoscopy before that in 12/11 did not reveal source of bleeding)  . Gastric ulcer   . NICM (nonischemic cardiomyopathy)     a.12/2011: s/p SJM [id number] single lead ICD, Ser #: [id number]  . Obstructive sleep apnea     on CPAP  . GERD (gastroesophageal reflux disease)   . HTN (hypertension)   . Gout   . Depression   . Colitis 2/11  . Chronic back pain   . Chronic knee pain   . DVT (deep venous thrombosis)     right leg  . Anxiety   . Rheumatoid arthritis(714.0)     "shoulders & knees"  . Sleep disturbance     07/14/11 "haven't slept in 10 months; since going to [facility]"  . Hx: UTI (urinary tract infection)   . Nocturia   . COPD (chronic obstructive pulmonary disease)   . Bronchitis   . PTSD (post-traumatic stress disorder)   . Schizophrenia   . Dysmenorrhea   . Morbid obesity   . Acute renal insufficiency     a. ?Cardiorenal 09/2012.  Marland Kitchen Hyperglycemia    Current Outpatient Prescriptions  Medication Sig Dispense Refill  . amitriptyline (ELAVIL) 150  MG tablet Take 150 mg by mouth at bedtime as needed for sleep.      Marland Kitchen aspirin EC 81 MG tablet Take 81 mg by mouth daily.      . carvedilol (COREG) 12.5 MG tablet TAKE ONE & ONE-HALF TABLETS BY MOUTH TWICE DAILY WTH A MEAL  90 tablet  0  . ferrous sulfate 325 (65 FE) MG tablet Take 1 tablet (325 mg total) by mouth 2 (two) times daily with a meal.  180 tablet  1  . hydrALAZINE (APRESOLINE) 50 MG tablet Take 1 tablet (50 mg total) by mouth 3 (three) times daily.  90 tablet  6  . HYDROcodone-acetaminophen (NORCO) 7.5-325 MG per tablet Take 1-2 tablets by mouth every 6 (six) hours as needed for pain. Take 2 pills prior to physical therapy.  40 tablet  0  . isosorbide mononitrate (IMDUR) 60 MG 24 hr tablet Take 90 mg by mouth  daily.      . metolazone (ZAROXOLYN) 2.5 MG tablet Take 2.5 mg by mouth. Take 2.5 mg 30 min before torsemide on every Monday Wednesday and Friday.      Marland Kitchen NEXIUM 40 MG capsule TAKE ONE CAPSULE BY MOUTH EVERY DAY BEFORE  BREAKFAST  90 capsule  1  . potassium chloride SA (K-DUR,KLOR-CON) 20 MEQ tablet Take 2 tablets (40 mEq total) by mouth 2 (two) times daily.  120 tablet  3  . spironolactone (ALDACTONE) 25 MG tablet TAKE ONE TABLET BY MOUTH EVERY DAY  30 tablet  5  . torsemide (DEMADEX) 100 MG tablet Take 1 tablet (100 mg total) by mouth daily.  30 tablet  6  . [DISCONTINUED] QUEtiapine (SEROQUEL) 100 MG tablet Take 400 mg by mouth at bedtime.        No current facility-administered medications for this visit.   Family History  Problem Relation Age of Onset  . Hypertension Sister   . Alcoholism Father     died in his 3's or 84's  . Other Mother     alive & well @ 22.   History   Social History  . Marital Status: Single    Spouse Name: N/A    Number of Children: N/A  . Years of Education: N/A   Social History Main Topics  . Smoking status: Former Smoker -- 0.50 packs/day for 27 years    Types: Cigarettes    Quit date: 12/18/2008  . Smokeless tobacco: Never Used  . Alcohol Use: No     Comment: "stopped drinking alcohol 12/2003; did drink ~ 6 shots/wk"  . Drug Use: No     Comment: "last cocaine use 2009"  . Sexual Activity: None   Other Topics Concern  . None   Social History Narrative   Pt lives in [place] with her dtr.  On disability.   Review of Systems: General: Denies fever, chills, diaphoresis, appetite change and fatigue.  HEENT: Denies change in vision, eye pain, redness, hearing loss, congestion, sore throat, rhinorrhea, sneezing, mouth sores, trouble swallowing, neck pain, neck stiffness and tinnitus.   Respiratory: Denies SOB, DOE, cough, chest tightness, and wheezing.   Cardiovascular: Positive for LLE swelling w/ associated blistering of left shin. Improving. Denies  chest pain, palpitations and leg swelling.  Gastrointestinal: Positive for heartburn. Denies nausea, vomiting, abdominal pain, diarrhea, constipation, blood in stool and abdominal distention.  Genitourinary: Denies dysuria, urgency, frequency, hematuria, flank pain and difficulty urinating.  Endocrine: Denies hot or cold intolerance, sweats, polyuria, polydipsia. Musculoskeletal: Positive for knee pain. Denies  myalgias, back pain, joint swelling, and gait problem.  Skin: Denies pallor, rash and wounds.  Neurological: Denies dizziness, seizures, syncope, weakness, lightheadedness, numbness and headaches.  Hematological: Denies adenopathy,easy bruising, personal or family bleeding history.  Psychiatric/Behavioral: Denies mood changes, confusion, nervousness, sleep disturbance and agitation.  Objective:  Physical Exam: Filed Vitals:   02/08/13 1016  BP: 113/73  Pulse: 91  Temp: 98 F (36.7 C)  TempSrc: Oral  Height: 5\' 6"  (1.676 m)  Weight: 390 lb 9.6 oz (177.175 kg)  SpO2: 97%   General: Vital signs reviewed. Patient is a well-developed and well-nourished, in no acute distress and cooperative with exam. Morbidly obese. Head: Normocephalic and atraumatic. Eyes: PERRL, EOMI, conjunctivae normal, No scleral icterus.  Neck: Supple, trachea midline, normal ROM, No JVD, masses, thyromegaly, or carotid bruit present.  Cardiovascular: RRR, S1 normal, S2 normal, no murmurs, gallops, or rubs. Pulmonary/Chest: Normal respiratory effort, CTAB, no wheezes, rales, or rhonchi. Abdominal: Soft. Non-tender, non-distended, bowel sounds are normal, no masses, organomegaly, or guarding present.  Musculoskeletal: No joint deformities, erythema. Knees stiff w/ decreased passive ROM. Mild tenderness to palpation of the knee joints. Extremities: +1 pitting edema in the RLE, w/ +2 pitting edema in the LLE w/ tense serous fluid filled blisters on the mid/lower shin. Much improved since last visit. Tenderness  minimal at this time.  Neurological: A&O x3, Strength is normal and symmetric bilaterally, cranial nerve II-XII are grossly intact, no focal motor deficit, sensory intact to light touch bilaterally.  Skin: Warm, dry and intact. No rashes or erythema. Psychiatric: Normal mood and affect. speech and behavior is normal. Cognition and memory are normal.   Assessment & Plan:   Please see problem-based assessment and plan.

## 2013-02-08 NOTE — Patient Instructions (Signed)
1. Please schedule a follow up appointment for 6-8 weeks.  Please start to document various methods of weight loss.  2. Please take all medications as prescribed. Take Nexium 40 mg daily starting 45 minutes prior to eating.  3. If you have worsening of your symptoms or new symptoms arise, please call the clinic ([phone]), or go to the ER immediately if symptoms are severe.  You have done a great job in taking all your medications. I appreciate it very much. Please continue doing that.

## 2013-02-09 ENCOUNTER — Encounter: Payer: Self-pay | Admitting: Internal Medicine

## 2013-02-09 NOTE — Assessment & Plan Note (Signed)
Patient recently seen at bariatric clinic at [facility]. Presented to [facility] today with paperwork for PCP to fill out stating she has exhausted all other weight loss possibilities including Doylene Bode, Weight Watchers, etc. She has not tried any weight loss programs at this time, but has been eating only small meals daily without improvement in weight. Morbid obesity has caused her such significant pain in her knees that she is unable to exercise and walk long distances without significant pain. -Advised patient to start weight loss program now in order to a) lose weight and b) increase chances of bariatric surgery in the near future. Will have patient return in 4-6 weeks to reassess weight and discuss new weight loss regimen, diet, etc.

## 2013-02-09 NOTE — Assessment & Plan Note (Signed)
Patient presented at last visit with LLE swelling, blistering of the skin, and tenderness. LE doppler revealed no DVT. Changes most likely 2/2 venous insufficiency. HHRN has been coming to home and assisting with bandage changes and [employer] use.  -Much improved today. Patient claims the pain and swelling is much better and is causing her less problems than before.  -Continue with Una boot at this time for venous insufficiency.

## 2013-02-09 NOTE — Assessment & Plan Note (Signed)
Patient still with chronic knee pain 2/2 DJD, exacerbated by morbid obesity. Discussed weight loss at length with patient (see a/p for severe obesity) as she is not a candidate for surgery at this time d/t body habitus. Pain is somewhat relieved by pain medications.

## 2013-02-09 NOTE — Assessment & Plan Note (Signed)
Well controlled today, 113/73. Continue current BP regimen.

## 2013-02-11 ENCOUNTER — Other Ambulatory Visit: Payer: Self-pay | Admitting: *Deleted

## 2013-02-11 DIAGNOSIS — S83259A Bucket-handle tear of lateral meniscus, current injury, unspecified knee, initial encounter: Secondary | ICD-10-CM

## 2013-02-11 DIAGNOSIS — M25561 Pain in right knee: Secondary | ICD-10-CM

## 2013-02-11 NOTE — Progress Notes (Signed)
I saw and evaluated the patient.  I personally confirmed the key portions of the history and exam documented by Dr. Jones and I reviewed pertinent patient test results.  The assessment, diagnosis, and plan were formulated together and I agree with the documentation in the resident's note.   

## 2013-02-11 NOTE — Telephone Encounter (Signed)
Pt would like for dr schooler to prescribe a greater quantity of pain med, she states she only sleeps maybe 1 hour a day and therefore takes her medicine for pain every 6 hours and takes 2 tabs each time. She states she is not a candidate for ortho surg due to her weight and it is not possible as dr Yetta Barre suggested for her to go on a diet or exercise, she would like to have weight loss surg sometime in the near future but for now she needs the pain medicine amount increased to 240/ month or near that if possible

## 2013-02-12 ENCOUNTER — Telehealth: Payer: Self-pay | Admitting: *Deleted

## 2013-02-12 NOTE — Telephone Encounter (Signed)
Pt is very upset, she was screaming and cursing on the phone, she stated she had already signed a pain contract and she needs her medicine, please call her at the [phone]

## 2013-02-12 NOTE — Telephone Encounter (Signed)
Called pt this am, left message for rtc

## 2013-02-12 NOTE — Telephone Encounter (Signed)
I will not prescribe #240 pills as this is potentially hazardous with increased side effects. She will need to come in to review her medication options and either sign a pain contract or be referred to pain clinic.

## 2013-02-13 ENCOUNTER — Other Ambulatory Visit: Payer: Self-pay | Admitting: Internal Medicine

## 2013-02-13 ENCOUNTER — Other Ambulatory Visit: Payer: Self-pay | Admitting: *Deleted

## 2013-02-13 DIAGNOSIS — S83259S Bucket-handle tear of lateral meniscus, current injury, unspecified knee, sequela: Secondary | ICD-10-CM

## 2013-02-13 DIAGNOSIS — M25561 Pain in right knee: Secondary | ICD-10-CM

## 2013-02-13 DIAGNOSIS — M1710 Unilateral primary osteoarthritis, unspecified knee: Secondary | ICD-10-CM

## 2013-02-13 DIAGNOSIS — S83259A Bucket-handle tear of lateral meniscus, current injury, unspecified knee, initial encounter: Secondary | ICD-10-CM

## 2013-02-13 MED ORDER — HYDROCODONE-ACETAMINOPHEN 10-325 MG PO TABS: 1.0000 | ORAL_TABLET | Freq: Three times a day (TID) | ORAL | Status: DC | PRN

## 2013-02-13 MED ORDER — HYDROCODONE-ACETAMINOPHEN 7.5-325 MG PO TABS: 1.0000 | ORAL_TABLET | Freq: Four times a day (QID) | ORAL | Status: DC | PRN

## 2013-02-13 NOTE — Progress Notes (Signed)
Rx called in to pharmacy and appt made for appt with Dr Bosie Clos reqarding new pain contract. Stanton Kidney Lennox Dolberry RN 02/13/13 1:30PM

## 2013-02-13 NOTE — Telephone Encounter (Signed)
New Rx called in to pharmacy per Dr Bosie Clos and pt aware needs appt with Dr Bosie Clos regarding new pain med contract. Ellwood Dense to call pt and sch appt.

## 2013-02-13 NOTE — Telephone Encounter (Signed)
Pt needs to be seen in clinic before any further refills after this one. Her contract needs to be adjusted and/or referred to pain clinic.

## 2013-02-26 ENCOUNTER — Telehealth: Payer: Self-pay | Admitting: *Deleted

## 2013-02-26 ENCOUNTER — Other Ambulatory Visit: Payer: Self-pay | Admitting: Cardiology

## 2013-02-26 NOTE — Telephone Encounter (Signed)
Beth with [facility] [phone] called to get verbal order for uno boot right foot. Pt called starting to get sores right leg. Left leg sores are heaked. Verbal order given. Stanton Kidney Jaivon Vanbeek RN 02/26/13 3:30PM

## 2013-02-27 NOTE — Telephone Encounter (Signed)
Yes, verbal order for [facility] boot is fine.

## 2013-02-28 ENCOUNTER — Other Ambulatory Visit: Payer: Self-pay | Admitting: *Deleted

## 2013-02-28 MED ORDER — AMITRIPTYLINE HCL 150 MG PO TABS: 150.0000 mg | ORAL_TABLET | Freq: Every evening | ORAL | Status: DC | PRN

## 2013-03-01 ENCOUNTER — Encounter (HOSPITAL_COMMUNITY): Payer: Self-pay

## 2013-03-01 ENCOUNTER — Ambulatory Visit (HOSPITAL_COMMUNITY)
Admission: RE | Admit: 2013-03-01 | Discharge: 2013-03-01 | Disposition: A | Discharge status: Home / Self Care | Payer: Medicare Other | Source: Ambulatory Visit | Attending: Internal Medicine | Admitting: Internal Medicine

## 2013-03-01 VITALS — BP 124/82 | HR 97 | Wt 390.0 lb

## 2013-03-01 DIAGNOSIS — Z79899 Other long term (current) drug therapy: Secondary | ICD-10-CM | POA: Insufficient documentation

## 2013-03-01 DIAGNOSIS — Z87891 Personal history of nicotine dependence: Secondary | ICD-10-CM | POA: Insufficient documentation

## 2013-03-01 DIAGNOSIS — Z7982 Long term (current) use of aspirin: Secondary | ICD-10-CM | POA: Insufficient documentation

## 2013-03-01 DIAGNOSIS — N189 Chronic kidney disease, unspecified: Secondary | ICD-10-CM | POA: Insufficient documentation

## 2013-03-01 DIAGNOSIS — I5022 Chronic systolic (congestive) heart failure: Secondary | ICD-10-CM | POA: Insufficient documentation

## 2013-03-01 DIAGNOSIS — M109 Gout, unspecified: Secondary | ICD-10-CM | POA: Insufficient documentation

## 2013-03-01 DIAGNOSIS — Z86718 Personal history of other venous thrombosis and embolism: Secondary | ICD-10-CM | POA: Insufficient documentation

## 2013-03-01 DIAGNOSIS — K219 Gastro-esophageal reflux disease without esophagitis: Secondary | ICD-10-CM | POA: Insufficient documentation

## 2013-03-01 DIAGNOSIS — I129 Hypertensive chronic kidney disease with stage 1 through stage 4 chronic kidney disease, or unspecified chronic kidney disease: Secondary | ICD-10-CM | POA: Insufficient documentation

## 2013-03-01 DIAGNOSIS — G4733 Obstructive sleep apnea (adult) (pediatric): Secondary | ICD-10-CM | POA: Insufficient documentation

## 2013-03-01 DIAGNOSIS — M069 Rheumatoid arthritis, unspecified: Secondary | ICD-10-CM | POA: Insufficient documentation

## 2013-03-01 DIAGNOSIS — I428 Other cardiomyopathies: Secondary | ICD-10-CM | POA: Insufficient documentation

## 2013-03-01 DIAGNOSIS — I509 Heart failure, unspecified: Secondary | ICD-10-CM | POA: Insufficient documentation

## 2013-03-01 NOTE — Patient Instructions (Signed)
Follow up in 1 month with an ECHO  Do the following things EVERYDAY: 1) Weigh yourself in the morning before breakfast. Write it down and keep it in a log. 2) Take your medicines as prescribed 3) Eat low salt foods-Limit salt (sodium) to 2000 mg per day.  4) Stay as active as you can everyday 5) Limit all fluids for the day to less than 2 liters 

## 2013-03-01 NOTE — Progress Notes (Signed)
Patient ID: [name], female   DOB: [birth year], 52 y.o.   MRN: [id number] PCP: Dr. Bosie Clos  52 yo with systolic CHF probably secondary to severe MR now s/p MV repair, rheumatoid arthritis, schizophrenia, and PUD presents for followup.  Last echo in 6/13 showed EF 30-35% with no MR and no significant mitral stenosis.  Patient was seen in CHF clinic in 2/13 and admitted because of volume overload.  She was diuresed and discharged on torsemide 80 qam, 40 qpm.  She was re-admitted in 3/13 after overdosing on Seroquel with hypotension.  She was re-admitted again in 5/13 after she quit taking her cardiac meds and became short of breath.  She was restarted on her meds and diuresed.  She had a St Jude ICD placed in 8/13.  She was admitted in 5/14 with acute on chronic systolic CHF and elevated creatinine.  She was put on IV milrinone and diuresed with IV Lasix.  Losartan was stopped due to increased creatinine.  RHC showed reasonable filling pressures after diuresis and CI 2.17.   She returns for follow up. Breathing at her baseline. + orthopnea sleeps in reclinier. . Denies PND. Not using CPAP. Weight at home 381-385 pounds. She was out of metolazone for few days. Drinking > 2 liters per day. Evaluated for gastric bypass but her PCP would like her to try and lose weight. Followed by [facility]. R and LLE wrapped with [employer].   Labs (8/11) K 4.4, creatinine 1.1, BNP 145  Labs (9/11): K 3.9, creatinine 1.0, BNP 84, LFTs normal, HCT 29.3  Labs (10/11): K 4.0, creatinine 1.55 => 1.2, uric acid 13, TSH normal, BNP 75 => 172  Labs (11/11): BNP 110 => 105, creatinine 3.6 => 1.2 => 1.5, K 3.7  Labs (1/12): K 3.5, creatinine 1.2, BNP 70  Labs (2/12): LDL 105, HDL 39 Labs (6/12): 3.7, creatinine 1.12 Labs (11/12): K 4.8, creatinine 0.85, BNP 88 Labs (12/12): K 3.9, creatinine 1.1, BNP 32 Labs (3/13): K 4, creatinine 0.85 Labs (5/13): K 4, creatinine 1.04 => 0.9, BNP 715 => 36, TSH normal Labs (8/13): K 4.4,  creatinine 1.1 Labs (9/13): K 4.5, creatinine 1.15, BNP 76 Labs (11/13): K 4.3, creatinine 1.1, BNP 31 Labs (12/13): K 4.7, creatinine 1.3 Labs (5/14): K 4.2, creatinine 0.8 Labs (8/14): K 4, creatinine 1.48 Labs (9/14): K 4.6, creatinine 1.72 Labs (02/08/13): K 3.9 Creatinine 1.62  Allergies:  1) ! * Colchicine  2) ! Morphine   Past History:  1. Congestive heart failure, most likely secondary to severe mitral regurgitation. TEE (6/10): EF 40%, diffuse hypokinesis, mild to moderately dilated LV, severe eccentric MR, small PFO. Cardiac MRI (6/10): EF 37% with global hypokinesis, moderate to severe MR, no delayed enhancement (no evidence for sarcoidosis or other infiltrative disease). TTE (10/10) after MV repair: EF 25-30%, moderately dilated LV, moderate diastolic dysfunction, mildly depressed RV function, trivial MR, MV mean gradient 5 mmHg, PASP 30 mmHg. RHC (12/10): mean RA 19, PA 46/26, mean PCWP 28, CI 2.5, SVO2 63%. TTE (2/11): EF 35-40% with diffuse hypokinesis, no significant MR or MS. TTE (7/11): EF 45%, mild global hypokinesis, s/p MV repair, no mitral regurgitation, minimal mitral stenosis, PA systolic pressure 40 mmHg, mild LV dilation.  TTE (11/12): EF 30-35%, mild LV dilation, mild LVH, s/p MV repair with no regurgitation and minimal stenosis.  TTE (3/13): EF 25-30%, diffuse hypokinesis, no significant mitral stenosis by pressure halftime with mean gradient 7 mmHg across valve, no MR.  Echo (  6/13): EF 30-35%, mildly dilated LV, mild mitral stenosis but no MR.  St Jude ICD placed in 8/13. RHC (5/14): mean RA 10, PA 23/10, mean PCWP 17, CI 2.17.   2. Severe mitral regurgitation (see above). Minimally invasive mitral valve repair on 12/18/08. She additionally had TV repair and PFO closure.  3. Microcytic anemia: EGD and colonoscopy in 12/11 did not reveal source of bleeding.  4. H/o gastric ulcers.  5. Morbid obesity.  6. Obstructive sleep apnea, on CPAP.  7. GERD.  8. Hypertension,  controlled.  9. Rheumatoid arthritis  10. Gout.  11. Depression.  12. LHC (6/10): No angiographic CAD. Mildly dilated LV. 3+ MR. EF 40%.  13. Prior smoking, quit  14. Colitis (2/11)  15. Schizophrenia versus schizoaffective disorder 16. History of right leg DVT   Family History:  Negative for cardiac or renal disease.  No FH of Colon Cancer  Social History:  Single, 5 children. Unemployed.  Tobacco Use - Yes. Smoked < 1 ppd, quit 6/10.  Alcohol Use - no  Regular Exercise - no  Drug Use - no, hx crack-cocaine use  Daily Caffeine Use: 2 daily   Review of Systems  All systems reviewed and negative except as per HPI.  Current Outpatient Prescriptions  Medication Sig Dispense Refill  . amitriptyline (ELAVIL) 150 MG tablet Take 1 tablet (150 mg total) by mouth at bedtime as needed for sleep.  30 tablet  5  . aspirin EC 81 MG tablet Take 81 mg by mouth daily.      . carvedilol (COREG) 12.5 MG tablet TAKE ONE & ONE-HALF TABLETS BY MOUTH TWICE DAILY WTH A MEAL  90 tablet  0  . febuxostat (ULORIC) 40 MG tablet Take 80 mg by mouth.      . ferrous sulfate 325 (65 FE) MG tablet Take 1 tablet (325 mg total) by mouth 2 (two) times daily with a meal.  180 tablet  1  . hydrALAZINE (APRESOLINE) 50 MG tablet Take 1 tablet (50 mg total) by mouth 3 (three) times daily.  90 tablet  6  . HYDROcodone-acetaminophen (NORCO) 10-325 MG per tablet Take 1 tablet by mouth every 8 (eight) hours as needed for pain.  108 tablet  0  . isosorbide mononitrate (IMDUR) 60 MG 24 hr tablet TAKE ONE & ONE-HALF TABLETS BY MOUTH ONCE DAILY  45 tablet  0  . metolazone (ZAROXOLYN) 2.5 MG tablet Take 2.5 mg by mouth. Take 2.5 mg 30 min before torsemide on every Monday Wednesday and Friday.      Marland Kitchen NEXIUM 40 MG capsule TAKE ONE CAPSULE BY MOUTH EVERY DAY BEFORE  BREAKFAST  90 capsule  1  . potassium chloride SA (K-DUR,KLOR-CON) 20 MEQ tablet Take 2 tablets (40 mEq total) by mouth 2 (two) times daily.  120 tablet  3  .  predniSONE (DELTASONE) 5 MG tablet Take 5 mg by mouth daily. Take 2 tabs for 5 days, then 1 tab for  15 days then stop      . spironolactone (ALDACTONE) 25 MG tablet TAKE ONE TABLET BY MOUTH EVERY DAY  30 tablet  5  . torsemide (DEMADEX) 100 MG tablet Take 1 tablet (100 mg total) by mouth daily.  30 tablet  6  . [DISCONTINUED] QUEtiapine (SEROQUEL) 100 MG tablet Take 400 mg by mouth at bedtime.        No current facility-administered medications for this encounter.   Filed Vitals:   03/01/13 0930  BP: 124/82  Pulse: 97  Weight: 390 lb (176.903 kg)  SpO2: 99%   General: NAD, obese. Sitting in WC Neck: Thick neck, JVP difficult to assess d/t body habitus, no thyromegaly or thyroid nodule.  Lungs: Clear to auscultation bilaterally with normal respiratory effort. CV: Nondisplaced PMI.  Heart regular S1/S2, no S3/S4, no murmur.   No carotid bruit.  Normal pedal pulses.  Abdomen: Soft, markedly obese. nontender, no distention.  Neurologic: Alert and oriented x 3. Moves all 4 without difficulty Psych: Normal affect. Extremities: No clubbing or cyanosis. RLE unna boot.   Assessment/Plan 1. Mitral valve disorders  Patient is s/p MV repair. No MR and only minimal MS on last echo. 2. Chronic systolic heart failure  Nonischemic cardiomyopathy. ECHO EF 30-35% 12/2011. St Jude ICD.  NYHA class III symptoms but stable. Weight stable for her 390 pounds. Continue metolazone M, W, and Friday along with torsemide 100 mg daily. Ongoing excessive fluid intake. Reinforced the need to restrict fluids to less than 2L a day and take her medications. - Continue current Coreg, imdur, hydralazine and spironolactone.  Continue AHC for assistance with lower extremity edema and heart failure. 3. CKD Losartan stopped and creatinine trending back down. Creatinine 1.6  4. Morbid obesity Continues to be a major issue for her. F/U PCP next month weight about weight loss.   Follow up 1 month with an ECHO and Dr Greig Castilla 9:52 AM

## 2013-03-04 ENCOUNTER — Other Ambulatory Visit: Payer: Self-pay | Admitting: Cardiology

## 2013-03-05 ENCOUNTER — Telehealth: Payer: Self-pay | Admitting: *Deleted

## 2013-03-05 NOTE — Telephone Encounter (Signed)
[name] calls to report that pt has new ulcers on L lower leg, 2-3 blisters w/ 1 draining, una boot was reapplied today to L leg, do you approve, any advice? She also has more pain in legs and ask for an increase in pain meds, please advise on this, [name] feels she is truly in need of additional meds for pain.

## 2013-03-06 NOTE — Telephone Encounter (Signed)
Pt needs to be evaluated in Clinic, first available appointment.  I have not evaluated her since she developed LE ulcers.  May need to be managed at Wound Care Clinic.

## 2013-03-11 ENCOUNTER — Telehealth: Payer: Self-pay | Admitting: *Deleted

## 2013-03-11 NOTE — Telephone Encounter (Signed)
Agree with follow-up appointment tomorrow.

## 2013-03-11 NOTE — Telephone Encounter (Signed)
Call from [place], [place] with [facility]  [phone] She reports pt c/o rectal bleeding, each time she has a BM.  It's on the paper each times she wipes.  I called and she reports BBB on TP. She has not checked her stool.  Onset 2 days ago. She has had bleeding in past and had a Colonoscopy with negative findings. - per patient.  Will see tomorrow  Pt also asking for refill on pain meds.  Dr Bosie Clos wanted pt to be seen first.

## 2013-03-12 ENCOUNTER — Encounter: Payer: Self-pay | Admitting: Internal Medicine

## 2013-03-12 ENCOUNTER — Ambulatory Visit (INDEPENDENT_AMBULATORY_CARE_PROVIDER_SITE_OTHER): Payer: Medicare Other | Admitting: Internal Medicine

## 2013-03-12 VITALS — BP 111/87 | HR 92 | Temp 98.3°F | Ht 66.0 in | Wt 382.9 lb

## 2013-03-12 DIAGNOSIS — M7989 Other specified soft tissue disorders: Secondary | ICD-10-CM

## 2013-03-12 DIAGNOSIS — M25561 Pain in right knee: Secondary | ICD-10-CM

## 2013-03-12 DIAGNOSIS — S83259S Bucket-handle tear of lateral meniscus, current injury, unspecified knee, sequela: Secondary | ICD-10-CM

## 2013-03-12 DIAGNOSIS — M1710 Unilateral primary osteoarthritis, unspecified knee: Secondary | ICD-10-CM

## 2013-03-12 DIAGNOSIS — M25569 Pain in unspecified knee: Secondary | ICD-10-CM

## 2013-03-12 DIAGNOSIS — IMO0002 Reserved for concepts with insufficient information to code with codable children: Secondary | ICD-10-CM

## 2013-03-12 DIAGNOSIS — K625 Hemorrhage of anus and rectum: Secondary | ICD-10-CM

## 2013-03-12 MED ORDER — HYDROCODONE-ACETAMINOPHEN 10-325 MG PO TABS: 1.0000 | ORAL_TABLET | Freq: Three times a day (TID) | ORAL | Status: DC | PRN

## 2013-03-12 NOTE — Patient Instructions (Addendum)
**   Try to be more gentile when you wipe your bottom after pooping.   ** If you continue to see blood when you wipe, please call the clinic  **With your right leg, you can use a compression stocking to help decrease the swelling  ** I have referred you to the wound care center for further evaluation of your left leg; please be sure to go to this appointment

## 2013-03-12 NOTE — Progress Notes (Signed)
Patient ID: [name], female   DOB: August 16, 1960, 52 y.o.   MRN: [id number]  Subjective:   Patient ID: [name] female   DOB: 1961/03/01 52 y.o.   MRN: [id number]  HPI: [name] is a 52 y.o. F with PMH chronic systolic CHF/nonischemic/valvular cardiomyopathy (s/p AICD placement), CKD stage III, hypertension, schizoaffective disorder, morbid obesity BMI greater than 60, degenerative joint disease of the knee presents to the clinic today with worsening LE pain and blood per rectum.   Pt c/o of increased BLE pain 2/2 worsening LE ulcerations. She has a HH nurse who thought the pt needed an increase in her pain regimen due to new ulcerations on her LLE with 2-3 blisters with one draining. A new una boot was placed on 10/21. The pt is requesting a refill on her pain medications.   Also, the pt's [facility] nurse called stating that the pt is having BRBPR with each BM on the toilet paper when she wipes.  Pt states that she has been constipated last week and took a laxative on Friday morning. She noticed a very small amount of blood on her toilet paper on Friday and today.    Past Medical History  Diagnosis Date  . Severe mitral regurgitation     a. Minimally invasive MV repair on 12/18/08. Additionally TV repair  and PFO closure.  . Chronic systolic CHF (congestive heart failure)     a. 12/2011 Echo: EF 30%, diff HK, mild LVH, Gr 1 DD, mildly dil LA. b. 2/2 NICM.  Marland Kitchen Microcytic anemia     a. Small capsule endoscopy 2012 with gastric erosion and small colonic AVMs. b. EGD and colonoscopy before that in 12/11 did not reveal source of bleeding)  . Gastric ulcer   . NICM (nonischemic cardiomyopathy)     a.12/2011: s/p SJM [id number] single lead ICD, Ser #: [id number]  . Obstructive sleep apnea     on CPAP  . GERD (gastroesophageal reflux disease)   . HTN (hypertension)   . Gout   . Depression   . Colitis 2/11  . Chronic back pain   . Chronic knee pain   . DVT (deep venous  thrombosis)     right leg  . Anxiety   . Rheumatoid arthritis(714.0)     "shoulders & knees"  . Sleep disturbance     07/14/11 "haven't slept in 10 months; since going to [facility]"  . Hx: UTI (urinary tract infection)   . Nocturia   . COPD (chronic obstructive pulmonary disease)   . Bronchitis   . PTSD (post-traumatic stress disorder)   . Schizophrenia   . Dysmenorrhea   . Morbid obesity   . Acute renal insufficiency     a. ?Cardiorenal 09/2012.  Marland Kitchen Hyperglycemia    Current Outpatient Prescriptions  Medication Sig Dispense Refill  . amitriptyline (ELAVIL) 150 MG tablet Take 1 tablet (150 mg total) by mouth at bedtime as needed for sleep.  30 tablet  5  . aspirin EC 81 MG tablet Take 81 mg by mouth daily.      . carvedilol (COREG) 12.5 MG tablet TAKE ONE & ONE-HALF TABLETS BY MOUTH TWICE DAILY WITH  A  MEAL  90 tablet  0  . febuxostat (ULORIC) 40 MG tablet Take 80 mg by mouth.      . ferrous sulfate 325 (65 FE) MG tablet Take 1 tablet (325 mg total) by mouth 2 (two) times daily with a meal.  180 tablet  1  .  hydrALAZINE (APRESOLINE) 50 MG tablet Take 1 tablet (50 mg total) by mouth 3 (three) times daily.  90 tablet  6  . HYDROcodone-acetaminophen (NORCO) 10-325 MG per tablet Take 1 tablet by mouth every 8 (eight) hours as needed for pain.  90 tablet  0  . isosorbide mononitrate (IMDUR) 60 MG 24 hr tablet TAKE ONE & ONE-HALF TABLETS BY MOUTH ONCE DAILY  45 tablet  0  . metolazone (ZAROXOLYN) 2.5 MG tablet Take 2.5 mg by mouth. Take 2.5 mg 30 min before torsemide on every Monday Wednesday and Friday.      Marland Kitchen NEXIUM 40 MG capsule TAKE ONE CAPSULE BY MOUTH EVERY DAY BEFORE  BREAKFAST  90 capsule  1  . potassium chloride SA (K-DUR,KLOR-CON) 20 MEQ tablet Take 2 tablets (40 mEq total) by mouth 2 (two) times daily.  120 tablet  3  . spironolactone (ALDACTONE) 25 MG tablet TAKE ONE TABLET BY MOUTH EVERY DAY  30 tablet  5  . torsemide (DEMADEX) 100 MG tablet Take 1 tablet (100 mg total) by mouth daily.   30 tablet  6  . predniSONE (DELTASONE) 5 MG tablet Take 5 mg by mouth daily. Take 2 tabs for 5 days, then 1 tab for  15 days then stop      . [DISCONTINUED] QUEtiapine (SEROQUEL) 100 MG tablet Take 400 mg by mouth at bedtime.        No current facility-administered medications for this visit.   Family History  Problem Relation Age of Onset  . Hypertension Sister   . Alcoholism Father     died in his 45's or 51's  . Other Mother     alive & well @ 51.   History   Social History  . Marital Status: Single    Spouse Name: N/A    Number of Children: N/A  . Years of Education: N/A   Social History Main Topics  . Smoking status: Former Smoker -- 0.50 packs/day for 27 years    Types: Cigarettes    Quit date: 12/18/2008  . Smokeless tobacco: Never Used  . Alcohol Use: No     Comment: "stopped drinking alcohol 12/2003; did drink ~ 6 shots/wk"  . Drug Use: No     Comment: "last cocaine use 2009"  . Sexual Activity: None   Other Topics Concern  . None   Social History Narrative   Pt lives in [place] with her dtr.  On disability.   Review of Systems: A 12 point ROS was performed; pertinent positives and negatives were noted in the HPI   Objective:  Physical Exam: Filed Vitals:   03/12/13 1509  BP: 111/87  Pulse: 92  Temp: 98.3 F (36.8 C)  TempSrc: Oral  Height: 5\' 6"  (1.676 m)  Weight: 382 lb 14.4 oz (173.682 kg)  SpO2: 98%   Constitutional: Vital signs reviewed.  Patient is a morbidly obese female in no acute distress and cooperative with exam.  Head: Normocephalic and atraumatic Eyes: PERRL, EOMI, conjunctivae normal, No scleral icterus.  Cardiovascular: RRR, no MRG Pulmonary/Chest: normal respiratory effort, CTAB, no wheezes, rales, or rhonchi Abdominal: Obese, soft. Non-tender, non-distended, no guarding, no masses appreciated Rectal: Unable to completely visiualize the anus. No gross blood on DRE. Hemoccult mildly positive.  Musculoskeletal: Bilateral feet are warm  and well perfused. LLE wrapped in una boot by wound care- did not remove, RLE with a few excoriations on the dorsum of the lower leg it that were healing well, w/o infection  Neurological: A&O x3, nonfocal Psychiatric: Normal mood and affect. speech and behavior is normal.   Assessment & Plan:   Please refer to Problem List based Assessment and Plan

## 2013-03-12 NOTE — Assessment & Plan Note (Addendum)
Pt was seen in Sept c/o of LE edema, blisters, and skin changes, which were thought to be 2/2 to venous insufficiency. She has a [facility] nurse who is providing wound care and is wrapping the LLE with an una boot, 2/2 blisters on the back of her LLE, most recently on 10/27. Per pt, she is not having in drainage from the blisters. Her HH nurse called on 10/21 b/c the pt was c/o pain 2/2 to the blistering/ulcers on the leg. She has not been seen at the [facility], but I think this would be a good idea for the pt. Her RLE does have 2+ pitting edema but is free of any wounds.  - [facility] referral - Refill of Vicodin 10-325 one tablet every 8 hours when necessary pain, dispense #90, 0 refills

## 2013-03-12 NOTE — Assessment & Plan Note (Addendum)
Patient with constipation last week. Took laxative on Friday. Noticed small amount of blood on toilet paper after wiping after bowel movement on Friday and today. Patient's endorses that when she wipes she is using her finger to white and side of the rectum to make sure she is very clean. This is when she's noticed the blood, but she states it is a small amount blood. She did have a colonoscopy in 2012 showed some diverticulosis but was otherwise unremarkable. Hemoccult was (mildly) positive on exam. Because of the likely digital trauma that she has caused herself, and because she had a normal colonoscopy in 2012, I do not think that she needs further workup at this time. However if she continues to have blood per rectum, she is advised to call the clinic, and will need further workup, possibly a flexible sigmoidoscopy.

## 2013-03-13 NOTE — Progress Notes (Signed)
Case discussed with Dr. Glenn at the time of the visit. We reviewed the resident's history and exam and pertinent patient test results. I agree with the assessment, diagnosis and plan of care documented in the resident's note. 

## 2013-03-19 ENCOUNTER — Telehealth (HOSPITAL_COMMUNITY): Payer: Self-pay | Admitting: *Deleted

## 2013-03-19 NOTE — Telephone Encounter (Signed)
Beth, RN called to report pt's weight is up, last week was 381 yesterday was 389 and she took her Metolazone, as she take it every Mon, Wed and Fri, today she is up to 390 lb, pt has have edema in LE but does not report any more SOB than usual, discussed w/Ali Elsie Ra, NP will have pt take a metolazone today as well, [name] is aware and will let pt know she states she will call pt tomorrow to check on her and will be seeing the pt again on Thursday

## 2013-03-21 ENCOUNTER — Telehealth (HOSPITAL_COMMUNITY): Payer: Self-pay | Admitting: *Deleted

## 2013-03-21 NOTE — Telephone Encounter (Signed)
Received call from [place], RN with [facility], pt's wt is only down 1 lb she is 389 today, she reports pt did urinate more yesterday, per [name], NP if wt tomorrow is 388 lb or greater [facility] can give 80 mg IV Lasix and 40 meq extra KCL and draw bmet, Beth aware and verbalizes understanding, she states pt is suppose to call her in the AM

## 2013-03-24 ENCOUNTER — Telehealth: Payer: Self-pay | Admitting: Cardiology

## 2013-03-24 NOTE — Telephone Encounter (Signed)
Heather, please make sure she gets in.  Thanks.

## 2013-03-24 NOTE — Telephone Encounter (Signed)
Beth, RN with [facility] called to report patient's weight is up 5 lbs since yesterday. Over the last week or so her weight has been "trending up" and her baseline weight is 381-385 lbs. She takes torsemide 100 mg daily in addition to metolazone 2.5 mg three times weekly. On Friday her weight was up to 390 lbs and she was given Lasix 80 IV x 1 and potassiium. According to RN, her weight did not change. This AM she weighs 395 lbs and reports some increased SOB but not in any distress. I cannot see recent BMET results but Beth, RN with [facility] reports her last BMET was drawn on Friday and she doesn't think her serum Cr was increased from baseline.   Discussed with Dr. Delton See. Benard Rink, RN with [facility] to give metolazone 5 mg now x 1 in addition to Lasix 80 mg IV x 1. She should follow-up in CHF clinic on Monday or Tuesday to see Dr. Shirlee Latch or PA/NP and have BMET drawn. Beth, RN expressed verbal understanding and agrees with plan. I sent a message to CHF team.

## 2013-03-25 ENCOUNTER — Telehealth (HOSPITAL_COMMUNITY): Payer: Self-pay | Admitting: Cardiology

## 2013-03-25 ENCOUNTER — Ambulatory Visit (INDEPENDENT_AMBULATORY_CARE_PROVIDER_SITE_OTHER): Payer: Medicare Other | Admitting: Internal Medicine

## 2013-03-25 ENCOUNTER — Encounter: Payer: Self-pay | Admitting: Internal Medicine

## 2013-03-25 VITALS — BP 106/71 | HR 107 | Temp 98.3°F | Ht 66.0 in | Wt 395.3 lb

## 2013-03-25 DIAGNOSIS — J209 Acute bronchitis, unspecified: Secondary | ICD-10-CM

## 2013-03-25 DIAGNOSIS — M25561 Pain in right knee: Secondary | ICD-10-CM

## 2013-03-25 DIAGNOSIS — M25569 Pain in unspecified knee: Secondary | ICD-10-CM

## 2013-03-25 MED ORDER — DM-GUAIFENESIN ER 30-600 MG PO TB12: 1.0000 | ORAL_TABLET | Freq: Two times a day (BID) | ORAL | Status: DC

## 2013-03-25 MED ORDER — BENZONATATE 100 MG PO CAPS: 100.0000 mg | ORAL_CAPSULE | Freq: Three times a day (TID) | ORAL | Status: DC | PRN

## 2013-03-25 NOTE — Telephone Encounter (Signed)
Pt was given additional dose of Lasix over the weekend Pt is down 4 lbs However pt does c/o hacking cough with yellowish mucus and SOB Will see PCP 11/10 and CHf clinic 11/11   Please advise

## 2013-03-25 NOTE — Progress Notes (Signed)
  Subjective:    Patient ID: [name], female    DOB: 09/04/1960, 52 y.o.   MRN: [id number]  HPI Presents with complaints of cough for past 3-4 days.  States that she is coughing up whitish beige phlegm resulting in chest soreness. Denies shortness of breath or chest pain. Denies fever, rhinorrhea, sore throat or head congestion. Also inquiring about status of possible bariatric surgery.  She has been baking most of her food and decreased portions to 2 spoonfuls. Drinking Crystal light for beverages. Physical activity is limited by her bilateral degenerative knee disease and BMI >60. Requesting elevated toilet seat.   Review of Systems  Constitutional: Negative for fever and fatigue.  Eyes: Negative for redness and itching.  Respiratory: Positive for cough. Negative for chest tightness and shortness of breath.   Cardiovascular: Positive for leg swelling. Negative for chest pain and palpitations.  Genitourinary: Positive for enuresis.       While on diuretics has had some urinary incontinence when she couldn't get to the bathroom fast enough  Musculoskeletal: Positive for arthralgias and joint swelling.  Skin: Negative for rash and wound.  Neurological: Negative for seizures, syncope and headaches.  Psychiatric/Behavioral: Negative for confusion.       Objective:   Physical Exam  Constitutional: She is oriented to person, place, and time. She appears well-developed.  Morbidly obese  HENT:  Head: Normocephalic and atraumatic.  Eyes: Conjunctivae and EOM are normal. Pupils are equal, round, and reactive to light.  Neck: Neck supple.  Cardiovascular: Normal rate, regular rhythm and normal heart sounds.   No murmur heard. Abdominal: Soft. Bowel sounds are normal. There is no tenderness.  obese  Musculoskeletal: She exhibits edema.       Right lower leg: She exhibits edema.       Left lower leg: She exhibits edema.  2+ BLE tense edema  Neurological: She is alert and oriented  to person, place, and time.  Skin: Skin is warm and dry. No erythema.  Psychiatric: She has a normal mood and affect.          Assessment & Plan:  See separate problem list charting:  #1 acute bronchitis, likely viral: Mucinex and Tessalon Perles  #2 morbid obesity, BMI >60: will refer to bariatric surgery -daughter to bring paperwork to clinic  #3 CHF, systolic w/o exacerbation: cont diuretics -HF appt tomorrow

## 2013-03-25 NOTE — Patient Instructions (Signed)
For your cough, we have given you Mucinex and Tessalon pills. If it worsens please call the clinic for advice. Bring the bariatric surgery papers to the clinic and I will complete the paperwork. Follow-up in 1 month.

## 2013-03-25 NOTE — Telephone Encounter (Signed)
Spoke w/pt she states wt is down to 392 today, she is unable to come to appt today, appt sch for tomorrow 11/11 at 2:30

## 2013-03-26 ENCOUNTER — Ambulatory Visit (HOSPITAL_COMMUNITY)
Admission: RE | Admit: 2013-03-26 | Discharge: 2013-03-26 | Disposition: A | Discharge status: Home / Self Care | Payer: Medicare Other | Source: Ambulatory Visit | Attending: Cardiology | Admitting: Cardiology

## 2013-03-26 VITALS — BP 108/66 | HR 100 | Wt 393.8 lb

## 2013-03-26 DIAGNOSIS — N189 Chronic kidney disease, unspecified: Secondary | ICD-10-CM | POA: Insufficient documentation

## 2013-03-26 DIAGNOSIS — I428 Other cardiomyopathies: Secondary | ICD-10-CM | POA: Insufficient documentation

## 2013-03-26 DIAGNOSIS — I129 Hypertensive chronic kidney disease with stage 1 through stage 4 chronic kidney disease, or unspecified chronic kidney disease: Secondary | ICD-10-CM | POA: Insufficient documentation

## 2013-03-26 DIAGNOSIS — E0500 Thyrotoxicosis with diffuse goiter without thyrotoxic crisis or storm: Secondary | ICD-10-CM

## 2013-03-26 DIAGNOSIS — Z87891 Personal history of nicotine dependence: Secondary | ICD-10-CM | POA: Insufficient documentation

## 2013-03-26 DIAGNOSIS — I5022 Chronic systolic (congestive) heart failure: Secondary | ICD-10-CM | POA: Insufficient documentation

## 2013-03-26 DIAGNOSIS — M069 Rheumatoid arthritis, unspecified: Secondary | ICD-10-CM | POA: Insufficient documentation

## 2013-03-26 DIAGNOSIS — K219 Gastro-esophageal reflux disease without esophagitis: Secondary | ICD-10-CM | POA: Insufficient documentation

## 2013-03-26 DIAGNOSIS — I059 Rheumatic mitral valve disease, unspecified: Secondary | ICD-10-CM | POA: Insufficient documentation

## 2013-03-26 DIAGNOSIS — I509 Heart failure, unspecified: Secondary | ICD-10-CM | POA: Insufficient documentation

## 2013-03-26 MED ORDER — TORSEMIDE 100 MG PO TABS: 100.0000 mg | ORAL_TABLET | Freq: Every day | ORAL | Status: DC

## 2013-03-26 NOTE — Patient Instructions (Signed)
Add torsemide 50 mg on non metolazone days  Follow up in 1 mont with an ECHO  Do the following things EVERYDAY: 1) Weigh yourself in the morning before breakfast. Write it down and keep it in a log. 2) Take your medicines as prescribed 3) Eat low salt foods-Limit salt (sodium) to 2000 mg per day.  4) Stay as active as you can everyday 5) Limit all fluids for the day to less than 2 liters

## 2013-03-26 NOTE — Progress Notes (Signed)
Patient ID: [name], female   DOB: 07/30/1960, 52 y.o.   MRN: [id number] PCP: Dr. Bosie Clos  52 yo with systolic CHF probably secondary to severe MR now s/p MV repair, rheumatoid arthritis, schizophrenia, and PUD presents for followup.  Last echo in 6/13 showed EF 30-35% with no MR and no significant mitral stenosis.  Patient was seen in CHF clinic in 2/13 and admitted because of volume overload.  She was diuresed and discharged on torsemide 80 qam, 40 qpm.  She was re-admitted in 3/13 after overdosing on Seroquel with hypotension.  She was re-admitted again in 5/13 after she quit taking her cardiac meds and became short of breath.  She was restarted on her meds and diuresed.  She had a St Jude ICD placed in 8/13.  She was admitted in 5/14 with acute on chronic systolic CHF and elevated creatinine.  She was put on IV milrinone and diuresed with IV Lasix.  Losartan was stopped due to increased creatinine.  RHC showed reasonable filling pressures after diuresis and CI 2.17.   She returns for follow up. Last week she received 80 mg IV lasix on 2 occasions. Weight at home trending down from 396 to 390 pounds. Breathing at baseline. + orthopnea sleeps in recliner.  Denies PND. Not using CPAP. Drinking > 2 liters per day. Evaluated for gastric bypass but her PCP would like her to try and lose weight. Followed by [facility].    Labs (8/11) K 4.4, creatinine 1.1, BNP 145  Labs (9/11): K 3.9, creatinine 1.0, BNP 84, LFTs normal, HCT 29.3  Labs (10/11): K 4.0, creatinine 1.55 => 1.2, uric acid 13, TSH normal, BNP 75 => 172  Labs (11/11): BNP 110 => 105, creatinine 3.6 => 1.2 => 1.5, K 3.7  Labs (1/12): K 3.5, creatinine 1.2, BNP 70  Labs (2/12): LDL 105, HDL 39 Labs (6/12): 3.7, creatinine 1.12 Labs (11/12): K 4.8, creatinine 0.85, BNP 88 Labs (12/12): K 3.9, creatinine 1.1, BNP 32 Labs (3/13): K 4, creatinine 0.85 Labs (5/13): K 4, creatinine 1.04 => 0.9, BNP 715 => 36, TSH normal Labs (8/13): K 4.4,  creatinine 1.1 Labs (9/13): K 4.5, creatinine 1.15, BNP 76 Labs (11/13): K 4.3, creatinine 1.1, BNP 31 Labs (12/13): K 4.7, creatinine 1.3 Labs (5/14): K 4.2, creatinine 0.8 Labs (8/14): K 4, creatinine 1.48 Labs (9/14): K 4.6, creatinine 1.72 Labs (02/08/13): K 3.9 Creatinine 1.62  Allergies:  1) ! * Colchicine  2) ! Morphine   Past History:  1. Congestive heart failure, most likely secondary to severe mitral regurgitation. TEE (6/10): EF 40%, diffuse hypokinesis, mild to moderately dilated LV, severe eccentric MR, small PFO. Cardiac MRI (6/10): EF 37% with global hypokinesis, moderate to severe MR, no delayed enhancement (no evidence for sarcoidosis or other infiltrative disease). TTE (10/10) after MV repair: EF 25-30%, moderately dilated LV, moderate diastolic dysfunction, mildly depressed RV function, trivial MR, MV mean gradient 5 mmHg, PASP 30 mmHg. RHC (12/10): mean RA 19, PA 46/26, mean PCWP 28, CI 2.5, SVO2 63%. TTE (2/11): EF 35-40% with diffuse hypokinesis, no significant MR or MS. TTE (7/11): EF 45%, mild global hypokinesis, s/p MV repair, no mitral regurgitation, minimal mitral stenosis, PA systolic pressure 40 mmHg, mild LV dilation.  TTE (11/12): EF 30-35%, mild LV dilation, mild LVH, s/p MV repair with no regurgitation and minimal stenosis.  TTE (3/13): EF 25-30%, diffuse hypokinesis, no significant mitral stenosis by pressure halftime with mean gradient 7 mmHg across valve, no MR.  Echo (6/13): EF 30-35%, mildly dilated LV, mild mitral stenosis but no MR.  St Jude ICD placed in 8/13. RHC (5/14): mean RA 10, PA 23/10, mean PCWP 17, CI 2.17.   2. Severe mitral regurgitation (see above). Minimally invasive mitral valve repair on 12/18/08. She additionally had TV repair and PFO closure.  3. Microcytic anemia: EGD and colonoscopy in 12/11 did not reveal source of bleeding.  4. H/o gastric ulcers.  5. Morbid obesity.  6. Obstructive sleep apnea, on CPAP.  7. GERD.  8. Hypertension,  controlled.  9. Rheumatoid arthritis  10. Gout.  11. Depression.  12. LHC (6/10): No angiographic CAD. Mildly dilated LV. 3+ MR. EF 40%.  13. Prior smoking, quit  14. Colitis (2/11)  15. Schizophrenia versus schizoaffective disorder 16. History of right leg DVT   Family History:  Negative for cardiac or renal disease.  No FH of Colon Cancer  Social History:  Single, 5 children. Unemployed.  Tobacco Use - Yes. Smoked < 1 ppd, quit 6/10.  Alcohol Use - no  Regular Exercise - no  Drug Use - no, hx crack-cocaine use  Daily Caffeine Use: 2 daily   Review of Systems  All systems reviewed and negative except as per HPI.  Current Outpatient Prescriptions  Medication Sig Dispense Refill  . amitriptyline (ELAVIL) 150 MG tablet Take 1 tablet (150 mg total) by mouth at bedtime as needed for sleep.  30 tablet  5  . aspirin EC 81 MG tablet Take 81 mg by mouth daily.      . benzonatate (TESSALON) 100 MG capsule Take 1 capsule (100 mg total) by mouth 3 (three) times daily as needed for cough.  21 capsule  0  . carvedilol (COREG) 12.5 MG tablet TAKE ONE & ONE-HALF TABLETS BY MOUTH TWICE DAILY WITH  A  MEAL  90 tablet  0  . dextromethorphan-guaiFENesin (MUCINEX DM) 30-600 MG per 12 hr tablet Take 1 tablet by mouth 2 (two) times daily.  28 tablet  0  . docusate sodium (COLACE) 100 MG capsule Take 100 mg by mouth daily.      . febuxostat (ULORIC) 40 MG tablet Take 80 mg by mouth.      . ferrous sulfate 325 (65 FE) MG tablet Take 1 tablet (325 mg total) by mouth 2 (two) times daily with a meal.  180 tablet  1  . hydrALAZINE (APRESOLINE) 50 MG tablet Take 1 tablet (50 mg total) by mouth 3 (three) times daily.  90 tablet  6  . HYDROcodone-acetaminophen (NORCO) 10-325 MG per tablet Take 1 tablet by mouth every 8 (eight) hours as needed for pain.  90 tablet  0  . isosorbide mononitrate (IMDUR) 60 MG 24 hr tablet TAKE ONE & ONE-HALF TABLETS BY MOUTH ONCE DAILY  45 tablet  0  . metolazone (ZAROXOLYN) 2.5  MG tablet Take 2.5 mg by mouth. Take 2.5 mg 30 min before torsemide on every Monday Wednesday and Friday.      Marland Kitchen NEXIUM 40 MG capsule TAKE ONE CAPSULE BY MOUTH EVERY DAY BEFORE  BREAKFAST  90 capsule  1  . potassium chloride SA (K-DUR,KLOR-CON) 20 MEQ tablet Take 2 tablets (40 mEq total) by mouth 2 (two) times daily.  120 tablet  3  . spironolactone (ALDACTONE) 25 MG tablet TAKE ONE TABLET BY MOUTH EVERY DAY  30 tablet  5  . torsemide (DEMADEX) 100 MG tablet Take 1 tablet (100 mg total) by mouth daily.  30 tablet  6  . [  DISCONTINUED] QUEtiapine (SEROQUEL) 100 MG tablet Take 400 mg by mouth at bedtime.        No current facility-administered medications for this encounter.   Filed Vitals:   03/26/13 1455  BP: 108/66  Pulse: 100  Weight: 393 lb 12 oz (178.604 kg)  SpO2: 99%   General: NAD, obese. Sitting in WC Neck: Thick neck, JVP difficult to assess d/t body habitus , no thyromegaly or thyroid nodule.  Lungs: EW throughout. CV: Nondisplaced PMI.  Heart regular S1/S2, no S3/S4, no murmur.   No carotid bruit.  Normal pedal pulses.  Abdomen: Soft, markedly obese. nontender, no distention.  Neurologic: Alert and oriented x 3. Moves all 4 without difficulty Psych: Normal affect. Extremities: No clubbing or cyanosis. 2+ edema 1/2 up lower legs bilaterally.   Assessment/Plan 1. Mitral valve disorders  Patient is s/p MV repair. No MR and only minimal MS on last echo. 2. Chronic systolic heart failure  Nonischemic cardiomyopathy: ECHO EF 30-35% 12/2011. St Jude ICD.  NYHA class III symptoms but stable. Weight trending after she received 80 mg IV lasix 11/8 and 11/10.   - Continue metolazone M, W, and Friday and torsemide 100 mg qam.  - Add 50 mg torsemide in the pm on non-metolazone days.   - Drinking > 2 liters per day. Reinforced the need to restrict fluids to less than 2L a day and take her medications.   - Continue current Coreg, imdur, hydralazine and spironolactone at current doses.   -  ARB stopped with elevated creatinine.  - Continue [facility] for assistance with lower extremity edema and heart failure.  Check BMET next week.  3. CKD Losartan stopped and creatinine trending back down. Creatinine 1.6  4. Morbid obesity Continues to be a major issue for her. Planning for possible gastric bypass.   Follow up 1 month with an ECHO and Dr Greig Castilla 3:02 PM  Patient seen with NP, agree with the above note.  Mrs Demeyer continues to struggle with volume overload.  I am going to have her continue her current metolazone tiw dosing with torsemide 100 qam.  On days that she does not take metolazone, I will have her take torsemide 50 qpm in addition to 100 qam.  BMET in 10 days.    [name] 03/26/2013 4:33 PM

## 2013-03-27 ENCOUNTER — Encounter (HOSPITAL_BASED_OUTPATIENT_CLINIC_OR_DEPARTMENT_OTHER): Payer: Medicare Other | Attending: General Surgery

## 2013-03-27 DIAGNOSIS — E119 Type 2 diabetes mellitus without complications: Secondary | ICD-10-CM | POA: Insufficient documentation

## 2013-03-27 DIAGNOSIS — L02419 Cutaneous abscess of limb, unspecified: Secondary | ICD-10-CM | POA: Insufficient documentation

## 2013-03-27 DIAGNOSIS — G4730 Sleep apnea, unspecified: Secondary | ICD-10-CM | POA: Insufficient documentation

## 2013-03-27 DIAGNOSIS — I890 Lymphedema, not elsewhere classified: Secondary | ICD-10-CM | POA: Insufficient documentation

## 2013-03-27 DIAGNOSIS — I129 Hypertensive chronic kidney disease with stage 1 through stage 4 chronic kidney disease, or unspecified chronic kidney disease: Secondary | ICD-10-CM | POA: Insufficient documentation

## 2013-03-27 DIAGNOSIS — N183 Chronic kidney disease, stage 3 unspecified: Secondary | ICD-10-CM | POA: Insufficient documentation

## 2013-03-27 NOTE — Progress Notes (Signed)
Case discussed with Dr. Schooler soon after the resident saw the patient.  We reviewed the resident's history and exam and pertinent patient test results.  I agree with the assessment, diagnosis and plan of care documented in the resident's note. 

## 2013-03-28 ENCOUNTER — Other Ambulatory Visit: Payer: Self-pay | Admitting: Cardiology

## 2013-03-29 NOTE — H&P (Signed)
[name], [name] NO.:  [id number]  MEDICAL RECORD NO.:  [id number]  LOCATION:  FOOT                         FACILITY:  [facility]  PHYSICIAN:  Ardath Sax, M.D.     DATE OF BIRTH:  04/26/1961  DATE OF ADMISSION:  03/27/2013 DATE OF DISCHARGE:                             HISTORY & PHYSICAL   HISTORY:  This is a 52 year old morbidly obese lady.  She weighs 390 pounds.  She is not diabetic, but she has many problems including stage III renal disease, hypertension, arthritis, history of deep vein thrombosis, gastroesophageal reflux, sleep apnea, nonischemic cardiomyopathy.  She also has a history of being a schizophrenic.  She is on many medicines including carvedilol, amitriptyline, Apresoline, isosorbide, Zaroxolyn, Nexium, potassium, spironolactone, Demadex, Seroquel.  When she was seen here today, she weighs 390 pounds.  Her blood pressure, however, is 120/80, pulse 90, temperature 98.6.  She has been seeing a doctor who is contemplating doing a gastric band procedure for her morbid obesity.  She comes to [place] today because her legs are markedly swollen and have little blisters on them and are painful.  It was diagnosed as cellulitis and lymphedema, and she was placed on an antibiotic by her doctor.  I do not see any evidence of infection, but her legs are very edematous, so we are going to put her on Profore Lite compression and we will see her back here in a week.  DIAGNOSES: 1. Bilateral venous hypertension. 2. Bilateral lymphedema. 3. Morbid obesity. 4. Hypertension. 5. Schizophrenia. 6. Cardiomyopathy. 7. History of renal failure.     Ardath Sax, M.D.     PP/MEDQ  D:  03/27/2013  T:  03/28/2013  Job:  [id number]

## 2013-04-02 ENCOUNTER — Other Ambulatory Visit: Payer: Self-pay | Admitting: Internal Medicine

## 2013-04-02 ENCOUNTER — Telehealth (HOSPITAL_COMMUNITY): Payer: Self-pay | Admitting: Cardiology

## 2013-04-02 ENCOUNTER — Telehealth: Payer: Self-pay | Admitting: Cardiology

## 2013-04-02 ENCOUNTER — Telehealth: Payer: Self-pay | Admitting: *Deleted

## 2013-04-02 DIAGNOSIS — J209 Acute bronchitis, unspecified: Secondary | ICD-10-CM

## 2013-04-02 MED ORDER — AZITHROMYCIN 250 MG PO TABS: ORAL_TABLET | ORAL | Status: DC

## 2013-04-02 NOTE — Telephone Encounter (Signed)
New problem    Pt complaining of coughing has been taking cough meds and past out. Please call.

## 2013-04-02 NOTE — Telephone Encounter (Signed)
Spoke w/pt she states she was coughing very had and passed out at about 5 this AM, she states other than cough she has been feeling ok no dizziness or presyncope, pt has call into pcp regarding cough and mucus, she will let me know in the morning what they say and if she will be able to make appt or not

## 2013-04-02 NOTE — Telephone Encounter (Signed)
Also received a call from [facility] with [facility] - # [phone] She reports pt is coughing all the time, not able to sleep.  She is coughing up dark yellow with some green mucous.  She reports pt passing out this AM, same as pt stated. Nurse called Cardiologist and was told to call us. She is asking if pt needs to be seen.

## 2013-04-02 NOTE — Telephone Encounter (Signed)
Spoke with patient. Pt states early this morning she was sitting on the toilet to urinate and coughing at the same time. She work up half way in and out of the bathroom. She does not feel she injured herself. Pt states the home health nurse checked her BP this morning after this happened and it was good. Her weight is down. Pt denies chest pain/SOB/dizziness at this time. I reviewed with Dr Shirlee Latch and he recommended pt monitor symptoms and call if symptoms return. Pt states she has a productive cough. She was seen her PCP 03/25/13 for bronchitis. Pt states she is not any better since that office visit.. She is going to call her PCP for further recommendations for productive cough.

## 2013-04-02 NOTE — Telephone Encounter (Signed)
Pt called and said today at 0500 she was sitting on toilet, started coughing and passed out. She fell over to the side of the tub. She feels she was out 10 minutes. She is still coughing, productive thick mucous. Denies fever, she can hear her chest rattling.  [facility] and [facility] were at the house,   Pt can not come in today.  She is scheduled for ECHO and Cards appointment tomorrow. I will ask Dr Bosie Clos to call pt.  Pt # [id number]

## 2013-04-02 NOTE — Telephone Encounter (Signed)
[name] called to report pt reports syncopal episode today. Pt remembers sitting on the toilet, when she woke up she was inside the tube. Pt is taking robitussin DM and mucinex DM for severe cough/ [name] states she is having sputum with white,yellow-green colors   Please advsie

## 2013-04-02 NOTE — Telephone Encounter (Signed)
Spoke with pt. States that she had a coughing "fit" on the toliet 5 am this morning. No head injury or other trauma (states that she was "slumped down").  Sounds cough-induced syncope. Will continue antu-tussives and prescribed azithromycin course for 5 days. Denies fever. Will keep appt with Cardiology tomorrow. If no improvement will consider CXR.

## 2013-04-03 ENCOUNTER — Ambulatory Visit (HOSPITAL_COMMUNITY)
Admission: RE | Admit: 2013-04-03 | Discharge: 2013-04-03 | Disposition: A | Discharge status: Home / Self Care | Payer: Medicare Other | Source: Ambulatory Visit | Attending: Internal Medicine | Admitting: Internal Medicine

## 2013-04-03 ENCOUNTER — Other Ambulatory Visit: Payer: Self-pay | Admitting: Cardiology

## 2013-04-03 ENCOUNTER — Encounter: Payer: Self-pay | Admitting: Internal Medicine

## 2013-04-03 ENCOUNTER — Ambulatory Visit (HOSPITAL_BASED_OUTPATIENT_CLINIC_OR_DEPARTMENT_OTHER)
Admission: RE | Admit: 2013-04-03 | Discharge: 2013-04-03 | Disposition: A | Discharge status: Home / Self Care | Payer: Medicare Other | Source: Ambulatory Visit | Attending: Internal Medicine | Admitting: Internal Medicine

## 2013-04-03 VITALS — BP 102/64 | HR 100 | Wt 388.5 lb

## 2013-04-03 DIAGNOSIS — I5022 Chronic systolic (congestive) heart failure: Secondary | ICD-10-CM

## 2013-04-03 DIAGNOSIS — I059 Rheumatic mitral valve disease, unspecified: Secondary | ICD-10-CM

## 2013-04-03 DIAGNOSIS — I10 Essential (primary) hypertension: Secondary | ICD-10-CM | POA: Insufficient documentation

## 2013-04-03 DIAGNOSIS — I517 Cardiomegaly: Secondary | ICD-10-CM

## 2013-04-03 DIAGNOSIS — I079 Rheumatic tricuspid valve disease, unspecified: Secondary | ICD-10-CM | POA: Insufficient documentation

## 2013-04-03 DIAGNOSIS — I509 Heart failure, unspecified: Secondary | ICD-10-CM | POA: Insufficient documentation

## 2013-04-03 NOTE — Patient Instructions (Signed)
Follow up in 2 months  Do the following things EVERYDAY: 1) Weigh yourself in the morning before breakfast. Write it down and keep it in a log. 2) Take your medicines as prescribed 3) Eat low salt foods-Limit salt (sodium) to 2000 mg per day.  4) Stay as active as you can everyday 5) Limit all fluids for the day to less than 2 liters 

## 2013-04-03 NOTE — Progress Notes (Signed)
Patient ID: [name], female   DOB: 05/01/1961, 52 y.o.   MRN: [id number] PCP: Dr. Bosie Clos  52 yo with systolic CHF probably secondary to severe MR now s/p MV repair, rheumatoid arthritis, schizophrenia, and PUD presents for followup.  Last echo in 6/13 showed EF 30-35% with no MR and no significant mitral stenosis.  Patient was seen in CHF clinic in 2/13 and admitted because of volume overload.  She was diuresed and discharged on torsemide 80 qam, 40 qpm.  She was re-admitted in 3/13 after overdosing on Seroquel with hypotension.  She was re-admitted again in 5/13 after she quit taking her cardiac meds and became short of breath.  She was restarted on her meds and diuresed.  She had a St Jude ICD placed in 8/13.  She was admitted in 5/14 with acute on chronic systolic CHF and elevated creatinine.  She was put on IV milrinone and diuresed with IV Lasix.  Losartan was stopped due to increased creatinine.  RHC showed reasonable filling pressures after diuresis and CI 2.17.   She returns for follow up. Last visit 50 mg torsemide added on Sun, Tues, Thurs, and Sat. Overall she is feeling better but yesterday she says she passed out in the bathroom while on the toilet urinating.  She had a paroxysmal of coughing then passed out and ended up in the bath tub.  Weight down from 390 to 385 pounds.Denies SOB/PND. Sleeps sitting on the couch due to back pain.  Not using CPAP. Drinking > 2 liters per day. Evaluated for gastric bypass but her PCP would like her to try and lose weight. Followed by [facility]    Echo today was reviewed.  EF up to 40% with mild to moderate RV dilation and mild RV dysfunction.   Labs (8/11) K 4.4, creatinine 1.1, BNP 145  Labs (9/11): K 3.9, creatinine 1.0, BNP 84, LFTs normal, HCT 29.3  Labs (10/11): K 4.0, creatinine 1.55 => 1.2, uric acid 13, TSH normal, BNP 75 => 172  Labs (11/11): BNP 110 => 105, creatinine 3.6 => 1.2 => 1.5, K 3.7  Labs (1/12): K 3.5, creatinine 1.2, BNP 70   Labs (2/12): LDL 105, HDL 39 Labs (6/12): 3.7, creatinine 1.12 Labs (11/12): K 4.8, creatinine 0.85, BNP 88 Labs (12/12): K 3.9, creatinine 1.1, BNP 32 Labs (3/13): K 4, creatinine 0.85 Labs (5/13): K 4, creatinine 1.04 => 0.9, BNP 715 => 36, TSH normal Labs (8/13): K 4.4, creatinine 1.1 Labs (9/13): K 4.5, creatinine 1.15, BNP 76 Labs (11/13): K 4.3, creatinine 1.1, BNP 31 Labs (12/13): K 4.7, creatinine 1.3 Labs (5/14): K 4.2, creatinine 0.8 Labs (8/14): K 4, creatinine 1.48 Labs (9/14): K 4.6, creatinine 1.72 Labs (02/08/13): K 3.9 Creatinine 1.62 Labs 04/02/13 K 4.1 Creatinine 1.96   Allergies:  1) ! * Colchicine  2) ! Morphine   Past History:  1. Congestive heart failure, most likely secondary to severe mitral regurgitation. TEE (6/10): EF 40%, diffuse hypokinesis, mild to moderately dilated LV, severe eccentric MR, small PFO. Cardiac MRI (6/10): EF 37% with global hypokinesis, moderate to severe MR, no delayed enhancement (no evidence for sarcoidosis or other infiltrative disease). TTE (10/10) after MV repair: EF 25-30%, moderately dilated LV, moderate diastolic dysfunction, mildly depressed RV function, trivial MR, MV mean gradient 5 mmHg, PASP 30 mmHg. RHC (12/10): mean RA 19, PA 46/26, mean PCWP 28, CI 2.5, SVO2 63%. TTE (2/11): EF 35-40% with diffuse hypokinesis, no significant MR or MS. TTE (7/11): EF  45%, mild global hypokinesis, s/p MV repair, no mitral regurgitation, minimal mitral stenosis, PA systolic pressure 40 mmHg, mild LV dilation.  TTE (11/12): EF 30-35%, mild LV dilation, mild LVH, s/p MV repair with no regurgitation and minimal stenosis.  TTE (3/13): EF 25-30%, diffuse hypokinesis, no significant mitral stenosis by pressure halftime with mean gradient 7 mmHg across valve, no MR.  Echo (6/13): EF 30-35%, mildly dilated LV, mild mitral stenosis but no MR.  St Jude ICD placed in 8/13. RHC (5/14): mean RA 10, PA 23/10, mean PCWP 17, CI 2.17.  Echo (11/14) with EF 40%, stable  repaired MV, mild to moderately dilated RV with mildly decreased systolic function.  2. Severe mitral regurgitation (see above). Minimally invasive mitral valve repair on 12/18/08. She additionally had TV repair and PFO closure.  3. Microcytic anemia: EGD and colonoscopy in 12/11 did not reveal source of bleeding.  4. H/o gastric ulcers.  5. Morbid obesity.  6. Obstructive sleep apnea, on CPAP.  7. GERD.  8. Hypertension, controlled.  9. Rheumatoid arthritis  10. Gout.  11. Depression.  12. LHC (6/10): No angiographic CAD. Mildly dilated LV. 3+ MR. EF 40%.  13. Prior smoking, quit  14. Colitis (2/11)  15. Schizophrenia versus schizoaffective disorder 16. History of right leg DVT  17. Syncope while coughing.   Family History:  Negative for cardiac or renal disease.  No FH of Colon Cancer  Social History:  Single, 5 children. Unemployed.  Tobacco Use - Yes. Smoked < 1 ppd, quit 6/10.  Alcohol Use - no  Regular Exercise - no  Drug Use - no, hx crack-cocaine use  Daily Caffeine Use: 2 daily   Review of Systems  All systems reviewed and negative except as per HPI.  Current Outpatient Prescriptions  Medication Sig Dispense Refill  . amitriptyline (ELAVIL) 150 MG tablet Take 1 tablet (150 mg total) by mouth at bedtime as needed for sleep.  30 tablet  5  . aspirin EC 81 MG tablet Take 81 mg by mouth daily.      Marland Kitchen azithromycin (ZITHROMAX) 250 MG tablet Take 2 tabs x1 day then 1 tab everyday for 4 more days.  6 each  0  . benzonatate (TESSALON) 100 MG capsule Take 1 capsule (100 mg total) by mouth 3 (three) times daily as needed for cough.  21 capsule  0  . carvedilol (COREG) 12.5 MG tablet TAKE ONE & ONE-HALF TABLETS BY MOUTH TWICE DAILY WITH  A  MEAL  90 tablet  0  . dextromethorphan-guaiFENesin (MUCINEX DM) 30-600 MG per 12 hr tablet Take 1 tablet by mouth 2 (two) times daily.  28 tablet  0  . docusate sodium (COLACE) 100 MG capsule Take 100 mg by mouth daily.      . febuxostat  (ULORIC) 40 MG tablet Take 80 mg by mouth.      . ferrous sulfate 325 (65 FE) MG tablet Take 1 tablet (325 mg total) by mouth 2 (two) times daily with a meal.  180 tablet  1  . hydrALAZINE (APRESOLINE) 50 MG tablet Take 1 tablet (50 mg total) by mouth 3 (three) times daily.  90 tablet  6  . HYDROcodone-acetaminophen (NORCO) 10-325 MG per tablet Take 1 tablet by mouth every 8 (eight) hours as needed for pain.  90 tablet  0  . isosorbide mononitrate (IMDUR) 60 MG 24 hr tablet TAKE ONE & ONE-HALF TABLETS BY MOUTH ONCE DAILY  45 tablet  0  . metolazone (ZAROXOLYN) 2.5  MG tablet Take 2.5 mg by mouth. Take 2.5 mg 30 min before torsemide on every Monday Wednesday and Friday.      Marland Kitchen NEXIUM 40 MG capsule TAKE ONE CAPSULE BY MOUTH EVERY DAY BEFORE  BREAKFAST  90 capsule  1  . potassium chloride SA (K-DUR,KLOR-CON) 20 MEQ tablet Take 2 tablets (40 mEq total) by mouth 2 (two) times daily.  120 tablet  3  . spironolactone (ALDACTONE) 25 MG tablet TAKE ONE TABLET BY MOUTH EVERY DAY  30 tablet  5  . torsemide (DEMADEX) 100 MG tablet Take 1 tablet (100 mg total) by mouth daily. Take an extra 50 mg in PM on days you do not take Metolazone  70 tablet  6  . [DISCONTINUED] QUEtiapine (SEROQUEL) 100 MG tablet Take 400 mg by mouth at bedtime.        No current facility-administered medications for this encounter.   Filed Vitals:   04/03/13 1133  BP: 102/64  Pulse: 100  Weight: 388 lb 8 oz (176.222 kg)  SpO2: 97%   General: NAD, obese. Sitting in WC Neck: Thick neck, JVP difficult to assess d/t body habitus , no thyromegaly or thyroid nodule.  Lungs: EW throughout. CV: Nondisplaced PMI.  Heart regular S1/S2, no S3/S4, no murmur.   No carotid bruit.  Normal pedal pulses.  Abdomen: Soft, markedly obese. nontender, no distention.  Neurologic: Alert and oriented x 3. Moves all 4 without difficulty Psych: Normal affect. Extremities: No clubbing or cyanosis. Legs wrapped bilaterally.   Assessment/Plan 1. Mitral  valve disorders  Patient is s/p MV repair. No MR and only minimal MS on today's echo.  2. Chronic systolic heart failure  Nonischemic cardiomyopathy: ECHO EF 40% with mild-mod RV ion and mildly decreased function 04/03/13 (improved). St Jude ICD.  NYHA class III symptoms but stable. Weight trending down, she has lost 5 pounds since last visit.    - Continue metolazone M, W, and Friday and torsemide 100 mg qam.  - At last visit, added 50 mg torsemide in the pm on non-metolazone days. I have told she can stop afternoon torsemide if her weight is 380 or less  - Drinking > 2 liters per day. Reinforced the need to restrict fluids to less than 2L a day and take her medications.   - Continue current Coreg, imdur, hydralazine and spironolactone at current doses.   - ARB stopped with elevated creatinine.  - Continue [facility] for assistance with lower extremity edema and heart failure.  - BMET reviewed from 04/02/13 Renal function stable.   3. CKD Creatinine has been basically stable, 1.96 when checked recently.  4. Morbid obesity Continues to be a major issue for her. Planning for possible gastric bypass.  5. Syncope 04/02/13.  Syncope occurred with a coughing paroxysmal while she was using the bathroom.  St Jude device interrogated, no VT.  Suspect cough/micturation syncope (vagal).   Follow up 2 months   [name],[name] 11:54 AM  Patient seen with NP, agree with the above note. She is losing weight with current diuretic regimen but is having trouble with the pm torsemide keeping her up.  Agree with dropping the qod pm torsemide if weight goes below 380.  I reviewed today's echo, and EF is improved (40%).  She has some RV dysfunction and needs to make sure that she uses her CPAP.  As above, suspect cough/micturation syncope.  St Jude rep interrogated device, did not appear to show significant arrhythmia.    [name] 04/03/2013

## 2013-04-04 ENCOUNTER — Other Ambulatory Visit: Payer: Self-pay | Admitting: Internal Medicine

## 2013-04-04 ENCOUNTER — Other Ambulatory Visit: Payer: Self-pay | Admitting: *Deleted

## 2013-04-04 ENCOUNTER — Telehealth: Payer: Self-pay | Admitting: *Deleted

## 2013-04-04 ENCOUNTER — Other Ambulatory Visit: Payer: Medicaid Other | Admitting: Internal Medicine

## 2013-04-04 DIAGNOSIS — M1710 Unilateral primary osteoarthritis, unspecified knee: Secondary | ICD-10-CM

## 2013-04-04 DIAGNOSIS — M069 Rheumatoid arthritis, unspecified: Secondary | ICD-10-CM

## 2013-04-04 DIAGNOSIS — M25561 Pain in right knee: Secondary | ICD-10-CM

## 2013-04-04 DIAGNOSIS — S83259S Bucket-handle tear of lateral meniscus, current injury, unspecified knee, sequela: Secondary | ICD-10-CM

## 2013-04-04 MED ORDER — HYDROCODONE-ACETAMINOPHEN 10-325 MG PO TABS: 1.0000 | ORAL_TABLET | Freq: Three times a day (TID) | ORAL | Status: DC | PRN

## 2013-04-04 NOTE — Telephone Encounter (Signed)
Barbara Cower calls and states he is with [facility] and needs written orders for 1) extra wide walker 2) extra wide commode, these need to be faxed to [facility]

## 2013-04-04 NOTE — Telephone Encounter (Signed)
Gastric Bypass paper work will not be available until the end of the month.

## 2013-04-04 NOTE — Telephone Encounter (Signed)
Pt states she had an episode of several evenings where she had to take 2 tabs at night to rest, she only has 2 tabs left and ask for a refill. This is per her [facility], beth [phone]. She also inquires about the progress on completing her gastric bypass paperwork

## 2013-04-08 ENCOUNTER — Telehealth: Payer: Self-pay | Admitting: Internal Medicine

## 2013-04-08 ENCOUNTER — Telehealth: Payer: Self-pay | Admitting: *Deleted

## 2013-04-08 NOTE — Telephone Encounter (Signed)
Orders have been faxed in to the DME Department at [facility] fax number [phone] for this patient's Walker and Commode.  Orders  were also placed in the [facility] Tray for the [facility] Rep to pick up.

## 2013-04-08 NOTE — Telephone Encounter (Signed)
[name] calls to say pt told her that she has episodes where she talks out of her head and it is starting to concern her, last was last night when talking w/ her sister on the phone, she and sister state that she was talking in a way that she could not be understood and this has happened more than once. She states she was followed at [facility] but does not want to go back there. i have tried to call pt to schedule an appt but had to leave vmail

## 2013-04-09 NOTE — Telephone Encounter (Signed)
She would need to be re-evaluated in the ED.  She was recently involuntary committed and was supposed to follow-up with psych.  If she came for a clinic visit, we would have to send her to the ED for further assessment.

## 2013-04-10 ENCOUNTER — Encounter: Payer: Self-pay | Admitting: Internal Medicine

## 2013-04-10 ENCOUNTER — Other Ambulatory Visit (HOSPITAL_COMMUNITY): Payer: Self-pay | Admitting: Cardiology

## 2013-04-10 DIAGNOSIS — I5022 Chronic systolic (congestive) heart failure: Secondary | ICD-10-CM

## 2013-04-10 MED ORDER — POTASSIUM CHLORIDE CRYS ER 20 MEQ PO TBCR: 40.0000 meq | EXTENDED_RELEASE_TABLET | Freq: Two times a day (BID) | ORAL | Status: DC

## 2013-04-10 NOTE — Telephone Encounter (Signed)
Requested Prescriptions   Signed Prescriptions Disp Refills  . potassium chloride SA (K-DUR,KLOR-CON) 20 MEQ tablet 120 tablet 3    Sig: Take 2 tablets (40 mEq total) by mouth 2 (two) times daily.    Authorizing Provider: Dolores Patty    Ordering User: JEFFRIES, Milagros Reap

## 2013-04-15 ENCOUNTER — Other Ambulatory Visit: Payer: Self-pay | Admitting: Internal Medicine

## 2013-04-15 ENCOUNTER — Encounter (INDEPENDENT_AMBULATORY_CARE_PROVIDER_SITE_OTHER): Payer: Medicare Other | Admitting: *Deleted

## 2013-04-15 ENCOUNTER — Encounter: Payer: Self-pay | Admitting: Internal Medicine

## 2013-04-15 DIAGNOSIS — I428 Other cardiomyopathies: Secondary | ICD-10-CM

## 2013-04-15 DIAGNOSIS — I5022 Chronic systolic (congestive) heart failure: Secondary | ICD-10-CM

## 2013-04-16 ENCOUNTER — Encounter (HOSPITAL_BASED_OUTPATIENT_CLINIC_OR_DEPARTMENT_OTHER): Payer: Medicare Other | Attending: General Surgery

## 2013-04-16 DIAGNOSIS — I890 Lymphedema, not elsewhere classified: Secondary | ICD-10-CM | POA: Insufficient documentation

## 2013-04-16 DIAGNOSIS — I87319 Chronic venous hypertension (idiopathic) with ulcer of unspecified lower extremity: Secondary | ICD-10-CM | POA: Insufficient documentation

## 2013-04-16 DIAGNOSIS — L97909 Non-pressure chronic ulcer of unspecified part of unspecified lower leg with unspecified severity: Secondary | ICD-10-CM | POA: Insufficient documentation

## 2013-04-16 DIAGNOSIS — I872 Venous insufficiency (chronic) (peripheral): Secondary | ICD-10-CM | POA: Insufficient documentation

## 2013-04-18 ENCOUNTER — Encounter (HOSPITAL_COMMUNITY): Payer: Medicare Other

## 2013-04-18 ENCOUNTER — Ambulatory Visit (HOSPITAL_COMMUNITY): Payer: Medicare Other

## 2013-04-21 LAB — MDC_IDC_ENUM_SESS_TYPE_REMOTE
Battery Remaining Longevity: 83 mo
Battery Voltage: 3.01 V
Brady Statistic RV Percent Paced: 0 %
HighPow Impedance: 81 Ohm
HighPow Impedance: 81 Ohm
Lead Channel Impedance Value: 490 Ohm
Lead Channel Pacing Threshold Amplitude: 0.75 V
Lead Channel Pacing Threshold Pulse Width: 0.5 ms
Lead Channel Sensing Intrinsic Amplitude: 12 mV
Lead Channel Setting Pacing Pulse Width: 0.5 ms
Zone Setting Detection Interval: 250 ms
Zone Setting Detection Interval: 300 ms

## 2013-04-22 ENCOUNTER — Encounter: Payer: Self-pay | Admitting: Internal Medicine

## 2013-04-22 ENCOUNTER — Telehealth: Payer: Self-pay | Admitting: *Deleted
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
## 2013-04-29 ENCOUNTER — Other Ambulatory Visit: Payer: Self-pay | Admitting: Cardiology

## 2013-04-29 ENCOUNTER — Telehealth: Payer: Self-pay | Admitting: *Deleted

## 2013-04-29 NOTE — Telephone Encounter (Signed)
I agree with re-evaluation today and tomorrow again, and ok to call after hours if the patient needs to. The patient needs a visit to the clinic if she feels worse. If she has chest pain, shortness of breath, dizziness, high fever, she should go to the ED immideately.

## 2013-04-29 NOTE — Telephone Encounter (Signed)
hhn states at visit today that pt has a very harsh cough that continues to the point pt "gags", no fevers, does have some productive yellow sputum, pt states this morning she had some red to pink in the sputum but attributes that to eating airhead candies during the night that were red, [name] saw no red tinged sputum today. Pt's wt was down today and saw no signs of heart failure r/t problems/ symptoms. [name] will see pt again Thursday 12/18, would you advise any action today or re- evaluate Thursday and instruct pt to call for problems before thurs?

## 2013-05-01 ENCOUNTER — Other Ambulatory Visit: Payer: Self-pay | Admitting: *Deleted

## 2013-05-01 DIAGNOSIS — M25561 Pain in right knee: Secondary | ICD-10-CM

## 2013-05-01 DIAGNOSIS — M1710 Unilateral primary osteoarthritis, unspecified knee: Secondary | ICD-10-CM

## 2013-05-01 DIAGNOSIS — S83259S Bucket-handle tear of lateral meniscus, current injury, unspecified knee, sequela: Secondary | ICD-10-CM

## 2013-05-02 ENCOUNTER — Telehealth: Payer: Self-pay | Admitting: *Deleted

## 2013-05-02 ENCOUNTER — Other Ambulatory Visit: Payer: Self-pay | Admitting: *Deleted

## 2013-05-02 ENCOUNTER — Telehealth (HOSPITAL_COMMUNITY): Payer: Self-pay | Admitting: Cardiology

## 2013-05-02 DIAGNOSIS — M1710 Unilateral primary osteoarthritis, unspecified knee: Secondary | ICD-10-CM

## 2013-05-02 DIAGNOSIS — S83259S Bucket-handle tear of lateral meniscus, current injury, unspecified knee, sequela: Secondary | ICD-10-CM

## 2013-05-02 DIAGNOSIS — M25561 Pain in right knee: Secondary | ICD-10-CM

## 2013-05-02 NOTE — Telephone Encounter (Signed)
[name] called to inform office that pts weight is up 4 lbs Today her weight was 388 [name] feels she may have a little fluid on board  no additional LE edema than normal. Pt just finished treatment for LE ulcers at the wound center Pt does have a cough however this has been going on for sometime and was treated by PCP a few weeks back but has now returned  Please advise

## 2013-05-02 NOTE — Telephone Encounter (Signed)
Called pt and Left message to call back.

## 2013-05-02 NOTE — Telephone Encounter (Signed)
Last refill 11/21 Call when ready @ [phone] Pt wants to get before the week end

## 2013-05-02 NOTE — Telephone Encounter (Signed)
Spoke w/pt she states wt is 388, she usually runs 381-385 denies increased edema but states a little more SOB than usual, she is taking meds as directed and will take metolazone tomorrow, advised to take 100 mg torsemide this afternoon instead of 50, pt aware and agreeable if wt doesn't return to normal will call back

## 2013-05-02 NOTE — Telephone Encounter (Signed)
Beth with [facility] about pain med - still pending - placed 05/01/13 3:30PM Both pt and Beth aware policy is 48 hours on meds. Clinic will call pt to pick up Rx. Checked on info on gastric bypass with [facility] - in Dr [employer] box to address. Left messages on pt and Beth's ID recording about med and gastric bypass info. Stanton Kidney Ajahnae Rathgeber RN 05/02/13 10:50AM

## 2013-05-03 MED ORDER — HYDROCODONE-ACETAMINOPHEN 10-325 MG PO TABS: 1.0000 | ORAL_TABLET | Freq: Three times a day (TID) | ORAL | Status: DC | PRN

## 2013-05-03 NOTE — Telephone Encounter (Signed)
Double request 

## 2013-05-03 NOTE — Telephone Encounter (Signed)
Refill requested already

## 2013-05-06 ENCOUNTER — Telehealth: Payer: Self-pay | Admitting: Cardiology

## 2013-05-06 ENCOUNTER — Telehealth: Payer: Self-pay | Admitting: *Deleted

## 2013-05-06 ENCOUNTER — Telehealth (HOSPITAL_COMMUNITY): Payer: Self-pay | Admitting: Cardiology

## 2013-05-06 NOTE — Telephone Encounter (Signed)
Beth with [facility] called about pt cough is worse. Chest hurts from coughing. Talked also with pt - past 2 days yellow productive cough. Pt and Katie with [facility] aware no appt in clinic 05/06/13 and 05/08/23. Suggest for pt to go to Urgent Care. States daughter can take her. Stanton Kidney Kathren Scearce RN 05/06/13 3:20PM

## 2013-05-06 NOTE — Telephone Encounter (Signed)
New Problem:   [name] is calling to check on the status of a requests she faxed over for cardiac clearance. She would like a call back.

## 2013-05-06 NOTE — Telephone Encounter (Signed)
Per Dr Gala Romney give IV lasix times 2 days, spoke w/Beth she states pt increased torsemide 1 day last week with no real benefit, she states pt is drinking a ton of water a day and discussed this with her, discussed w/Dr Bensimhon will give pt 80 mg IV lasix today and if wt not down 5 lbs tomorrow will give another 80 mg, Beth is aware and will arrange

## 2013-05-06 NOTE — Telephone Encounter (Signed)
Bath called to inform office pts weight is up to 391 Severe LE edema bilaterally L>R pt is using wraps to help with compression Pt is still c/o cough with pale yellow sputum   Please advise

## 2013-05-06 NOTE — Telephone Encounter (Signed)
What does she need clearance for? I have not seen anything for cardiac clearance for her.

## 2013-05-06 NOTE — Telephone Encounter (Signed)
Left message advising her that Dr. Shirlee Latch and his nurse are both out of the office this week and will be addressed when they are  back in the office.

## 2013-05-07 NOTE — Telephone Encounter (Signed)
lmtcb Debbie Taevyn Hausen RN  

## 2013-05-07 NOTE — Telephone Encounter (Signed)
Left message on voicemail that Dr. Shirlee Latch cleared pt for compression pump Faxed phone message to her as well Mylo Red RN

## 2013-05-07 NOTE — Telephone Encounter (Signed)
Melissa states that Dr. Wiliam Ke is ordering a compression pump for the patient but needs cardiac clearance since the patient has congestive heart failure.  Fax was initially sent to the wrong number.  Melissa will refax today  Mylo Red RN

## 2013-05-07 NOTE — Telephone Encounter (Signed)
That would be ok.

## 2013-05-08 ENCOUNTER — Telehealth (HOSPITAL_COMMUNITY): Payer: Self-pay | Admitting: *Deleted

## 2013-05-08 ENCOUNTER — Other Ambulatory Visit: Payer: Self-pay | Admitting: Cardiology

## 2013-05-08 MED ORDER — TORSEMIDE 100 MG PO TABS: 100.0000 mg | ORAL_TABLET | Freq: Two times a day (BID) | ORAL | Status: DC

## 2013-05-08 NOTE — Telephone Encounter (Signed)
Received call from [place], RN at [facility] she states pt received the IV lasix Mon and Tue and today wt is still 391 lb (the same as Mon) she states her legs are still very swollen, pt has been eating a lot of candy to help with dry mouth she advised pt to cut back on candy and fluids, per Tonye Becket, NP increase Torsemide to 100 mg bid, pt also took her metolazone today, if increased SOB or worsening symtpoms advised to report ER, [name] is aware and agreeable, she will f/u with pt on Fri

## 2013-05-13 ENCOUNTER — Other Ambulatory Visit: Payer: Self-pay | Admitting: Cardiology

## 2013-05-14 ENCOUNTER — Telehealth: Payer: Self-pay | Admitting: *Deleted

## 2013-05-14 ENCOUNTER — Encounter: Payer: Self-pay | Admitting: *Deleted
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
## 2013-05-20 ENCOUNTER — Other Ambulatory Visit: Payer: Self-pay | Admitting: *Deleted

## 2013-05-21 ENCOUNTER — Ambulatory Visit (INDEPENDENT_AMBULATORY_CARE_PROVIDER_SITE_OTHER): Payer: Medicare Other | Admitting: Internal Medicine

## 2013-05-21 ENCOUNTER — Encounter: Payer: Self-pay | Admitting: Internal Medicine

## 2013-05-21 VITALS — BP 121/83 | HR 102 | Temp 97.5°F | Wt 397.8 lb

## 2013-05-21 DIAGNOSIS — I5023 Acute on chronic systolic (congestive) heart failure: Secondary | ICD-10-CM

## 2013-05-21 DIAGNOSIS — N390 Urinary tract infection, site not specified: Secondary | ICD-10-CM

## 2013-05-21 DIAGNOSIS — M25569 Pain in unspecified knee: Secondary | ICD-10-CM

## 2013-05-21 DIAGNOSIS — M25561 Pain in right knee: Secondary | ICD-10-CM

## 2013-05-21 DIAGNOSIS — K219 Gastro-esophageal reflux disease without esophagitis: Secondary | ICD-10-CM

## 2013-05-21 DIAGNOSIS — S83259S Bucket-handle tear of lateral meniscus, current injury, unspecified knee, sequela: Secondary | ICD-10-CM

## 2013-05-21 DIAGNOSIS — M1710 Unilateral primary osteoarthritis, unspecified knee: Secondary | ICD-10-CM

## 2013-05-21 DIAGNOSIS — M25562 Pain in left knee: Principal | ICD-10-CM

## 2013-05-21 DIAGNOSIS — E538 Deficiency of other specified B group vitamins: Secondary | ICD-10-CM

## 2013-05-21 DIAGNOSIS — I10 Essential (primary) hypertension: Secondary | ICD-10-CM

## 2013-05-21 DIAGNOSIS — I509 Heart failure, unspecified: Secondary | ICD-10-CM

## 2013-05-21 DIAGNOSIS — N3941 Urge incontinence: Secondary | ICD-10-CM | POA: Insufficient documentation

## 2013-05-21 DIAGNOSIS — D509 Iron deficiency anemia, unspecified: Secondary | ICD-10-CM

## 2013-05-21 DIAGNOSIS — E539 Vitamin B deficiency, unspecified: Secondary | ICD-10-CM

## 2013-05-21 DIAGNOSIS — IMO0002 Reserved for concepts with insufficient information to code with codable children: Secondary | ICD-10-CM

## 2013-05-21 MED ORDER — HYDROCODONE-ACETAMINOPHEN 10-325 MG PO TABS: 1.0000 | ORAL_TABLET | Freq: Four times a day (QID) | ORAL | Status: DC | PRN

## 2013-05-21 MED ORDER — ESOMEPRAZOLE MAGNESIUM 20 MG PO CPDR: 20.0000 mg | DELAYED_RELEASE_CAPSULE | Freq: Two times a day (BID) | ORAL | Status: DC

## 2013-05-21 NOTE — Patient Instructions (Signed)
-  Regarding your knee pain, I have provided another script of pain medication so you can increase frequency from every 8 hours to every 6 hours (this script is for #90 pills, and you should have about #30 pills remaining).  This will be a one time script, you will need to be seen by your primary care doctor, Dr. Bosie Clos to increase dose chronically  -Regarding your reflux, change your dosing to 20mg  BID  -Regarding your urinary incontinence, let's start with making sure you don't have an infection  Please be sure to bring all of your medications with you to every visit.  Should you have any new or worsening symptoms, please be sure to call the clinic at [phone].

## 2013-05-21 NOTE — Assessment & Plan Note (Addendum)
Again, exacerbated by morbid obesity, with recent weight gain. She does have h/o RA, but symptoms seems more consistent with DJD.  Corticosteroid injections have been ineffective in the past. Given the chronicity of this issue, NSAIDs are not a viable option given CKD.  -Increase Norco 10-325 to q6h prn (was taking q8hprn) - I supplied her with #90 to add to the ~#30 she should have remaining to last until her next refill.  She will need to discuss this dose increase with her PCP -she has already been fitted for a walker to help with offloading weight until surgery - encourage walker use   ADDENDUM: (05/23/13, 11:40a): Patient called noting that insurance would not cover the additional norco, and is asking that we fill her norco for the month rather than as a bridge.  I am going to write a script for Norco 10-325 q6h prn #120, but she will not receive another refill until 07/02/13, as that is when she would be due after picking up script for 06/01/13 (again, she should have ~30 pill supply at present)

## 2013-05-21 NOTE — Progress Notes (Signed)
Subjective:   Patient ID: [name] female   DOB: Apr 23, 1961 53 y.o.   MRN: [id number]  Chief Complaint  Patient presents with  . Knee Pain    bilateral worse on left-unable to get surgery until she loses some weight  . Gastrophageal Reflux    nexium not working  . Urinary Incontinence    thinks its secondary to lasix, requests "bladder control" medicine  . Foot Pain    burning sensation on top of feet x    HPI: [name] is a 53 y.o. woman with history of sCHF, HTN, DJD exacerbated by morbid obesity, RA (RF 163 in 2009) and CKD 3 presents for an acute visit for above issue.  To note, regarding b/l knee pain, she was by sports medicine on 05/23/12 and received b/l corticosteroid injections - this was not helpful per patient.  At that visit, it was noted that she is not a candidate for knee surgery due to her body habitus.    She reports that pain is unbearable (feels like crawling on knees on hard pavement, 10/10) to the point she is scared to walk.  She is up crying in the middle of the night. No falls, but has had some near falls (feels like the knee joint is lax), pain wraps around knees and rushes up and down legs. No fevers/chills. Also has noticed b/l hip pain. No joint pains in upper extremities (in the past RA bothers shoulders).  RA flares occasionally in the cold, but hasn't been very bothersome. Has been seen by Dr. Nickola Major (last visit was 1.5 months ago, and was put on uloric).  Worsening over the last 3 weeks.  She is awaiting bariatric surgery and working this out with her insurance. She has already been to the required class.   Medications she has tried include Norco 10-325 q8h prn (but recently taking a few more than 3 pills/day)  If she eats after 12, she feels like food/liquids regurgitate up (she tries not to drink anything but her mouth stays dry).  No heart burn, no metallic taste in mouth but has a sweet taste. Dry cough bothersome at night  to the point that it hurts her chest. Has been going on about 1.5 weeks.   Urge incontinence for about 2 weeks. Sometimes she can hold her urine, other times she cannot.  This has been occuring since her torsemide dose was changed.  No burning with urination, but strong odor. Increased frequency. Also has incontinence with coughing.  Burning sensation on top of her feet, daily, shooting pain with numbness on bottom of feet.     Review of Systems: Constitutional: Denies fever, chills, diaphoresis, appetite change and fatigue.  HEENT: Denies photophobia, eye pain, redness, hearing loss, ear pain, congestion, sore throat, rhinorrhea, sneezing, mouth sores, trouble swallowing, neck pain, neck stiffness and tinnitus.  Respiratory: Denies SOB, DOE, chest tightness, and wheezing.  Cardiovascular: Denies chest pain, palpitations   Gastrointestinal: Denies nausea, vomiting, abdominal pain, diarrhea, constipation,blood in stool and abdominal distention.  Genitourinary: per HPI  Musculoskeletal: per HPI Skin: Denies pallor, rash and wound.  Neurological: Denies dizziness, seizures, syncope, weakness, lightheadedness, and headaches.    Past Medical History  Diagnosis Date  . Severe mitral regurgitation     a. Minimally invasive MV repair on 12/18/08. Additionally TV repair  and PFO closure.  . Chronic systolic CHF (congestive heart failure)     a. 12/2011 Echo: EF 30%, diff HK, mild LVH, Gr  1 DD, mildly dil LA. b. 2/2 NICM.  Marland Kitchen Microcytic anemia     a. Small capsule endoscopy 2012 with gastric erosion and small colonic AVMs. b. EGD and colonoscopy before that in 12/11 did not reveal source of bleeding)  . Gastric ulcer   . NICM (nonischemic cardiomyopathy)     a.12/2011: s/p SJM [id number] single lead ICD, Ser #: [id number]  . Obstructive sleep apnea     on CPAP  . GERD (gastroesophageal reflux disease)   . HTN (hypertension)   . Gout   . Depression   . Colitis 2/11  . Chronic back pain   . Chronic  knee pain   . DVT (deep venous thrombosis)     right leg  . Anxiety   . Rheumatoid arthritis(714.0)     "shoulders & knees"  . Sleep disturbance     07/14/11 "haven't slept in 10 months; since going to [facility]"  . Hx: UTI (urinary tract infection)   . Nocturia   . COPD (chronic obstructive pulmonary disease)   . Bronchitis   . PTSD (post-traumatic stress disorder)   . Schizophrenia   . Dysmenorrhea   . Morbid obesity   . Acute renal insufficiency     a. ?Cardiorenal 09/2012.  Marland Kitchen Hyperglycemia    Current Outpatient Prescriptions  Medication Sig Dispense Refill  . amitriptyline (ELAVIL) 150 MG tablet Take 1 tablet (150 mg total) by mouth at bedtime as needed for sleep.  30 tablet  5  . aspirin EC 81 MG tablet Take 81 mg by mouth daily.      Marland Kitchen azithromycin (ZITHROMAX) 250 MG tablet Take 2 tabs x1 day then 1 tab everyday for 4 more days.  6 each  0  . benzonatate (TESSALON) 100 MG capsule Take 1 capsule (100 mg total) by mouth 3 (three) times daily as needed for cough.  21 capsule  0  . carvedilol (COREG) 12.5 MG tablet TAKE ONE & ONE-HALF TABLETS BY MOUTH TWICE DAILY WITH  A  MEAL  90 tablet  0  . dextromethorphan-guaiFENesin (MUCINEX DM) 30-600 MG per 12 hr tablet Take 1 tablet by mouth 2 (two) times daily.  28 tablet  0  . docusate sodium (COLACE) 100 MG capsule Take 100 mg by mouth daily.      . febuxostat (ULORIC) 40 MG tablet Take 80 mg by mouth.      . ferrous sulfate 325 (65 FE) MG tablet Take 1 tablet (325 mg total) by mouth 2 (two) times daily with a meal.  180 tablet  1  . hydrALAZINE (APRESOLINE) 50 MG tablet Take 1 tablet (50 mg total) by mouth 3 (three) times daily.  90 tablet  6  . HYDROcodone-acetaminophen (NORCO) 10-325 MG per tablet Take 1 tablet by mouth every 8 (eight) hours as needed for severe pain.  120 tablet  0  . isosorbide mononitrate (IMDUR) 60 MG 24 hr tablet TAKE ONE AND ONE-HALF TABLETS BY MOUTH ONCE DAILY  45 tablet  0  . metolazone (ZAROXOLYN) 2.5 MG tablet  Take 2.5 mg by mouth. Take 2.5 mg 30 min before torsemide on every Monday Wednesday and Friday.      Marland Kitchen NEXIUM 40 MG capsule TAKE ONE CAPSULE BY MOUTH EVERY DAY BEFORE  BREAKFAST  90 capsule  1  . potassium chloride SA (K-DUR,KLOR-CON) 20 MEQ tablet Take 2 tablets (40 mEq total) by mouth 2 (two) times daily.  120 tablet  3  . spironolactone (ALDACTONE) 25 MG tablet TAKE ONE  TABLET BY MOUTH ONCE DAILY  30 tablet  6  . torsemide (DEMADEX) 100 MG tablet Take 1 tablet (100 mg total) by mouth 2 (two) times daily.  60 tablet  6  . [DISCONTINUED] QUEtiapine (SEROQUEL) 100 MG tablet Take 400 mg by mouth at bedtime.        No current facility-administered medications for this visit.   Family History  Problem Relation Age of Onset  . Hypertension Sister   . Alcoholism Father     died in his 33's or 68's  . Other Mother     alive & well @ 27.   History   Social History  . Marital Status: Single    Spouse Name: N/A    Number of Children: N/A  . Years of Education: N/A   Social History Main Topics  . Smoking status: Former Smoker -- 0.50 packs/day for 27 years    Types: Cigarettes    Quit date: 12/18/2008  . Smokeless tobacco: Never Used  . Alcohol Use: No     Comment: "stopped drinking alcohol 12/2003; did drink ~ 6 shots/wk"  . Drug Use: No     Comment: "last cocaine use 2009"  . Sexual Activity: Not on file   Other Topics Concern  . Not on file   Social History Narrative   Pt lives in [place] with her dtr.  On disability.    Objective:  Physical Exam: Filed Vitals:   05/21/13 1508  BP: 121/83  Pulse: 102  Temp: 97.5 F (36.4 C)  TempSrc: Oral  Weight: 397 lb 12.8 oz (180.441 kg)  SpO2: 99%    General: no acute distress HEENT: PERRL, EOMI, no scleral icterus Cardiac: RRR, no rubs, murmurs or gallops (difficult to hear due to habitus) Pulm: clear to auscultation bilaterally, moving normal volumes of air Abd: soft, nontender, nondistended, BS present, morbidly obese Ext:  warm and well perfused, 1-2+ pitting edema pedal edema, no asymetrical knee swelling, difficult to appreciate crepitus due to habitus, no warmth/erythma of knees Neuro: alert and oriented X3, cranial nerves II-XII grossly intact   Assessment & Plan:  Case and care discussed with Dr. Josem Kaufmann.  Please see problem oriented charting for further details. Patient to return as soon as possible for routine follow up with PCP.

## 2013-05-21 NOTE — Assessment & Plan Note (Addendum)
Symptoms seem to have worsened after her torsemide dose was increased.  She also seems to have a component of stress incontinence.  Again, morbid obesity is likely contributing, and initial treatment would include weight loss and behavioral training.   -Start with UA to r/o UTI -If no UTI, may consider oxybutynin until surgery  ADDENDUM: Called patient on 05/23/13 at 10:55a to inform her about UTI - plan to treat with Cipro 250 bid x 3 days - no answer, left msg on VM (ID by name on msg) - sent medication to [facility] at [employer] and informed her to call the clinic with any questions

## 2013-05-21 NOTE — Assessment & Plan Note (Addendum)
GERD is likely the etiology of her dry cough (no other s/s of infection).  Likely exacerbated by increased weight. Ultimately, weight loss/bariatric surgery is her primary issue.  She should also avoid tight fitting clothes.  She notes that she tries not to eat late but sometimes cannot help it.  Will do trial of changing frequency from 40 daily to  20 BID and monitor.

## 2013-05-22 LAB — URINALYSIS, ROUTINE W REFLEX MICROSCOPIC
BILIRUBIN URINE: NEGATIVE
Glucose, UA: NEGATIVE mg/dL
HGB URINE DIPSTICK: NEGATIVE
KETONES UR: NEGATIVE mg/dL
NITRITE: POSITIVE — AB
Protein, ur: NEGATIVE mg/dL
SPECIFIC GRAVITY, URINE: 1.01 (ref 1.005–1.030)
Urobilinogen, UA: 0.2 mg/dL (ref 0.0–1.0)
pH: 7.5 (ref 5.0–8.0)

## 2013-05-22 LAB — URINALYSIS, MICROSCOPIC ONLY
CASTS: NONE SEEN
CRYSTALS: NONE SEEN

## 2013-05-23 MED ORDER — HYDROCODONE-ACETAMINOPHEN 10-325 MG PO TABS: 1.0000 | ORAL_TABLET | Freq: Four times a day (QID) | ORAL | Status: DC | PRN

## 2013-05-23 MED ORDER — CIPROFLOXACIN HCL 250 MG PO TABS: 250.0000 mg | ORAL_TABLET | Freq: Two times a day (BID) | ORAL | Status: AC

## 2013-05-23 NOTE — Addendum Note (Signed)
Addended by: Belia Heman on: 05/23/2013 11:01 AM   Modules accepted: Orders

## 2013-05-23 NOTE — Addendum Note (Signed)
Addended by: Belia Heman on: 05/23/2013 11:50 AM   Modules accepted: Orders

## 2013-05-23 NOTE — Progress Notes (Signed)
Pharmacy called and d/c Rx for Norco # 90 Written on 1/6.  Rx was rewritten by Dr Everardo Beals for [facility] # 120, a one month supply. Pt to pick up.

## 2013-05-24 ENCOUNTER — Telehealth: Payer: Self-pay | Admitting: *Deleted

## 2013-05-24 ENCOUNTER — Telehealth (HOSPITAL_COMMUNITY): Payer: Self-pay | Admitting: *Deleted

## 2013-05-24 MED ORDER — METOLAZONE 2.5 MG PO TABS: ORAL_TABLET | ORAL | Status: DC

## 2013-05-24 NOTE — Telephone Encounter (Signed)
Pt's friend called and wanted to have an order done for [facility] to deliver pt a recliner, could you please do an order and have your nurse or chilonb. Assist with this?

## 2013-05-24 NOTE — Telephone Encounter (Signed)
Beth, RN called to concerned about pt's wt gain, she was 392 on Mon, 394 on Thur nad 395 today, she states pt is taking meds as directed, denies increased SOB but states she has more LE edema, discussed w/Amy Filbert Schilder, NP will give pt 80 mg IV lasix today along with extra 40 meq of KCL and increase Metolazone from 2.5 mg to 5 mg every Mon, Wed and Fri.  [name] is aware and agreeable, she will also draw a bmet on Mon and let us know how she is doing

## 2013-05-28 ENCOUNTER — Other Ambulatory Visit: Payer: Self-pay | Admitting: Cardiology

## 2013-05-28 ENCOUNTER — Telehealth: Payer: Self-pay | Admitting: *Deleted

## 2013-05-28 NOTE — Telephone Encounter (Signed)
Call from [facility] from [facility] to report that pt had a fall on Friday when getting up assisted by her home health Caregiver.  EMS was called to assist in getting patient up.  Beth, RN said there was no bruising or injuries noted.  Can be reached at [phone] for questions if needed.  Angelina Ok, RN 05/28/2013 12:14 PM.

## 2013-05-30 ENCOUNTER — Other Ambulatory Visit: Payer: Self-pay | Admitting: Internal Medicine

## 2013-05-30 NOTE — Telephone Encounter (Signed)
Please let me know how the order should be placed.  When I put in recliner nothing comes up.  If posible, please accept this as a verbal order.

## 2013-06-04 ENCOUNTER — Encounter (HOSPITAL_COMMUNITY): Payer: Self-pay | Admitting: Cardiology

## 2013-06-04 ENCOUNTER — Other Ambulatory Visit (HOSPITAL_COMMUNITY): Payer: Self-pay | Admitting: Adult Health

## 2013-06-04 ENCOUNTER — Inpatient Hospital Stay (HOSPITAL_COMMUNITY)
Admission: AD | Admit: 2013-06-04 | Discharge: 2013-06-07 | DRG: 292 | Disposition: A | Discharge status: Home / Self Care | Payer: Medicare Other | Source: Ambulatory Visit | Attending: Internal Medicine | Admitting: Internal Medicine

## 2013-06-04 ENCOUNTER — Ambulatory Visit (HOSPITAL_BASED_OUTPATIENT_CLINIC_OR_DEPARTMENT_OTHER)
Admission: RE | Admit: 2013-06-04 | Discharge: 2013-06-04 | Disposition: A | Discharge status: Home / Self Care | Payer: Medicare Other | Source: Ambulatory Visit | Attending: Internal Medicine | Admitting: Internal Medicine

## 2013-06-04 ENCOUNTER — Other Ambulatory Visit (HOSPITAL_COMMUNITY): Payer: Medicare Other

## 2013-06-04 ENCOUNTER — Ambulatory Visit (HOSPITAL_COMMUNITY)
Admission: RE | Admit: 2013-06-04 | Discharge: 2013-06-04 | Disposition: A | Discharge status: Home / Self Care | Payer: Medicare Other | Source: Ambulatory Visit | Attending: Internal Medicine | Admitting: Internal Medicine

## 2013-06-04 VITALS — BP 110/78 | HR 88 | Wt 386.0 lb

## 2013-06-04 DIAGNOSIS — N189 Chronic kidney disease, unspecified: Secondary | ICD-10-CM

## 2013-06-04 DIAGNOSIS — Z6841 Body Mass Index (BMI) 40.0 and over, adult: Secondary | ICD-10-CM

## 2013-06-04 DIAGNOSIS — G4733 Obstructive sleep apnea (adult) (pediatric): Secondary | ICD-10-CM | POA: Diagnosis present

## 2013-06-04 DIAGNOSIS — N179 Acute kidney failure, unspecified: Secondary | ICD-10-CM | POA: Diagnosis present

## 2013-06-04 DIAGNOSIS — M069 Rheumatoid arthritis, unspecified: Secondary | ICD-10-CM | POA: Diagnosis present

## 2013-06-04 DIAGNOSIS — I129 Hypertensive chronic kidney disease with stage 1 through stage 4 chronic kidney disease, or unspecified chronic kidney disease: Secondary | ICD-10-CM | POA: Diagnosis present

## 2013-06-04 DIAGNOSIS — I130 Hypertensive heart and chronic kidney disease with heart failure and stage 1 through stage 4 chronic kidney disease, or unspecified chronic kidney disease: Secondary | ICD-10-CM | POA: Diagnosis present

## 2013-06-04 DIAGNOSIS — I5022 Chronic systolic (congestive) heart failure: Secondary | ICD-10-CM

## 2013-06-04 DIAGNOSIS — I379 Nonrheumatic pulmonary valve disorder, unspecified: Secondary | ICD-10-CM

## 2013-06-04 DIAGNOSIS — J449 Chronic obstructive pulmonary disease, unspecified: Secondary | ICD-10-CM | POA: Diagnosis present

## 2013-06-04 DIAGNOSIS — I5023 Acute on chronic systolic (congestive) heart failure: Secondary | ICD-10-CM

## 2013-06-04 DIAGNOSIS — I82409 Acute embolism and thrombosis of unspecified deep veins of unspecified lower extremity: Secondary | ICD-10-CM

## 2013-06-04 DIAGNOSIS — Z87891 Personal history of nicotine dependence: Secondary | ICD-10-CM

## 2013-06-04 DIAGNOSIS — Z95810 Presence of automatic (implantable) cardiac defibrillator: Secondary | ICD-10-CM

## 2013-06-04 DIAGNOSIS — I82819 Embolism and thrombosis of superficial veins of unspecified lower extremities: Secondary | ICD-10-CM | POA: Diagnosis present

## 2013-06-04 DIAGNOSIS — I509 Heart failure, unspecified: Secondary | ICD-10-CM | POA: Diagnosis present

## 2013-06-04 DIAGNOSIS — I5043 Acute on chronic combined systolic (congestive) and diastolic (congestive) heart failure: Principal | ICD-10-CM | POA: Diagnosis present

## 2013-06-04 DIAGNOSIS — F209 Schizophrenia, unspecified: Secondary | ICD-10-CM | POA: Diagnosis present

## 2013-06-04 DIAGNOSIS — J4489 Other specified chronic obstructive pulmonary disease: Secondary | ICD-10-CM | POA: Diagnosis present

## 2013-06-04 DIAGNOSIS — I059 Rheumatic mitral valve disease, unspecified: Secondary | ICD-10-CM

## 2013-06-04 DIAGNOSIS — I5033 Acute on chronic diastolic (congestive) heart failure: Secondary | ICD-10-CM

## 2013-06-04 HISTORY — DX: Chronic diastolic (congestive) heart failure: I50.32

## 2013-06-04 HISTORY — DX: Presence of automatic (implantable) cardiac defibrillator: Z95.810

## 2013-06-04 LAB — CBC WITH DIFFERENTIAL/PLATELET
BASOS ABS: 0 10*3/uL (ref 0.0–0.1)
BASOS PCT: 0 % (ref 0–1)
Eosinophils Absolute: 0.2 10*3/uL (ref 0.0–0.7)
Eosinophils Relative: 3 % (ref 0–5)
HCT: 35.5 % — ABNORMAL LOW (ref 36.0–46.0)
Hemoglobin: 11 g/dL — ABNORMAL LOW (ref 12.0–15.0)
Lymphocytes Relative: 20 % (ref 12–46)
Lymphs Abs: 1.2 10*3/uL (ref 0.7–4.0)
MCH: 24.6 pg — AB (ref 26.0–34.0)
MCHC: 31 g/dL (ref 30.0–36.0)
MCV: 79.4 fL (ref 78.0–100.0)
Monocytes Absolute: 0.5 10*3/uL (ref 0.1–1.0)
Monocytes Relative: 9 % (ref 3–12)
NEUTROS ABS: 4.1 10*3/uL (ref 1.7–7.7)
NEUTROS PCT: 68 % (ref 43–77)
PLATELETS: 330 10*3/uL (ref 150–400)
RBC: 4.47 MIL/uL (ref 3.87–5.11)
RDW: 17.7 % — ABNORMAL HIGH (ref 11.5–15.5)
WBC: 6 10*3/uL (ref 4.0–10.5)

## 2013-06-04 LAB — COMPREHENSIVE METABOLIC PANEL
ALBUMIN: 3.1 g/dL — AB (ref 3.5–5.2)
ALK PHOS: 193 U/L — AB (ref 39–117)
ALT: 30 U/L (ref 0–35)
AST: 24 U/L (ref 0–37)
BILIRUBIN TOTAL: 0.3 mg/dL (ref 0.3–1.2)
BUN: 45 mg/dL — ABNORMAL HIGH (ref 6–23)
CHLORIDE: 90 meq/L — AB (ref 96–112)
CO2: 35 mEq/L — ABNORMAL HIGH (ref 19–32)
Calcium: 10 mg/dL (ref 8.4–10.5)
Creatinine, Ser: 1.74 mg/dL — ABNORMAL HIGH (ref 0.50–1.10)
GFR calc Af Amer: 38 mL/min — ABNORMAL LOW (ref >90)
GFR calc non Af Amer: 33 mL/min — ABNORMAL LOW (ref >90)
Glucose, Bld: 171 mg/dL — ABNORMAL HIGH (ref 70–99)
POTASSIUM: 3.4 meq/L — AB (ref 3.7–5.3)
Sodium: 139 mEq/L (ref 137–147)
TOTAL PROTEIN: 8.7 g/dL — AB (ref 6.0–8.3)

## 2013-06-04 LAB — MAGNESIUM: Magnesium: 1.8 mg/dL (ref 1.5–2.5)

## 2013-06-04 LAB — PRO B NATRIURETIC PEPTIDE: PRO B NATRI PEPTIDE: 123.5 pg/mL (ref 0–125)

## 2013-06-04 MED ORDER — SODIUM CHLORIDE 0.9 % IJ SOLN
3.0000 mL | Freq: Two times a day (BID) | INTRAMUSCULAR | Status: DC
  Administered 2013-06-04 – 2013-06-07 (×4): 3 mL via INTRAVENOUS

## 2013-06-04 MED ORDER — ONDANSETRON HCL 4 MG/2ML IJ SOLN: 4.0000 mg | Freq: Four times a day (QID) | INTRAMUSCULAR | Status: DC | PRN

## 2013-06-04 MED ORDER — SPIRONOLACTONE 25 MG PO TABS
25.0000 mg | ORAL_TABLET | Freq: Every day | ORAL | Status: DC
  Administered 2013-06-04 – 2013-06-07 (×4): 25 mg via ORAL
  Filled 2013-06-04 (×4): qty 1

## 2013-06-04 MED ORDER — SODIUM CHLORIDE 0.9 % IJ SOLN: 3.0000 mL | INTRAMUSCULAR | Status: DC | PRN

## 2013-06-04 MED ORDER — SODIUM CHLORIDE 0.9 % IV SOLN: 250.0000 mL | INTRAVENOUS | Status: DC | PRN

## 2013-06-04 MED ORDER — HYDROCODONE-ACETAMINOPHEN 10-325 MG PO TABS
1.0000 | ORAL_TABLET | Freq: Four times a day (QID) | ORAL | Status: DC | PRN
  Administered 2013-06-04 – 2013-06-07 (×9): 1 via ORAL
  Filled 2013-06-04 (×9): qty 1

## 2013-06-04 MED ORDER — CARVEDILOL 12.5 MG PO TABS
12.5000 mg | ORAL_TABLET | Freq: Two times a day (BID) | ORAL | Status: DC
  Administered 2013-06-05 – 2013-06-07 (×5): 12.5 mg via ORAL
  Filled 2013-06-04 (×7): qty 1

## 2013-06-04 MED ORDER — POTASSIUM CHLORIDE CRYS ER 20 MEQ PO TBCR
40.0000 meq | EXTENDED_RELEASE_TABLET | Freq: Two times a day (BID) | ORAL | Status: DC
Start: 2013-06-04 — End: 2013-06-06
  Administered 2013-06-04 – 2013-06-06 (×4): 40 meq via ORAL
  Filled 2013-06-04 (×6): qty 2

## 2013-06-04 MED ORDER — FERROUS SULFATE 325 (65 FE) MG PO TABS
325.0000 mg | ORAL_TABLET | Freq: Two times a day (BID) | ORAL | Status: DC
  Administered 2013-06-05 – 2013-06-07 (×5): 325 mg via ORAL
  Filled 2013-06-04 (×7): qty 1

## 2013-06-04 MED ORDER — DOCUSATE SODIUM 100 MG PO CAPS
100.0000 mg | ORAL_CAPSULE | Freq: Every day | ORAL | Status: DC
  Administered 2013-06-04 – 2013-06-07 (×4): 100 mg via ORAL
  Filled 2013-06-04 (×4): qty 1

## 2013-06-04 MED ORDER — AMITRIPTYLINE HCL 75 MG PO TABS
150.0000 mg | ORAL_TABLET | Freq: Every day | ORAL | Status: DC
  Administered 2013-06-04 – 2013-06-06 (×3): 150 mg via ORAL
  Filled 2013-06-04 (×4): qty 2

## 2013-06-04 MED ORDER — ASPIRIN EC 81 MG PO TBEC
81.0000 mg | DELAYED_RELEASE_TABLET | Freq: Every day | ORAL | Status: DC
  Administered 2013-06-04 – 2013-06-06 (×3): 81 mg via ORAL
  Filled 2013-06-04 (×4): qty 1

## 2013-06-04 MED ORDER — HYDRALAZINE HCL 50 MG PO TABS
50.0000 mg | ORAL_TABLET | Freq: Three times a day (TID) | ORAL | Status: DC
  Administered 2013-06-04 – 2013-06-07 (×9): 50 mg via ORAL
  Filled 2013-06-04 (×11): qty 1

## 2013-06-04 MED ORDER — ACETAMINOPHEN 325 MG PO TABS: 650.0000 mg | ORAL_TABLET | ORAL | Status: DC | PRN

## 2013-06-04 MED ORDER — FEBUXOSTAT 40 MG PO TABS
80.0000 mg | ORAL_TABLET | Freq: Every day | ORAL | Status: DC
  Administered 2013-06-04 – 2013-06-07 (×4): 80 mg via ORAL
  Filled 2013-06-04 (×4): qty 2

## 2013-06-04 MED ORDER — FUROSEMIDE 10 MG/ML IJ SOLN
10.0000 mg/h | INTRAVENOUS | Status: DC
  Administered 2013-06-04 – 2013-06-06 (×2): 10 mg/h via INTRAVENOUS
  Filled 2013-06-04 (×5): qty 25

## 2013-06-04 MED ORDER — ENOXAPARIN SODIUM 30 MG/0.3ML ~~LOC~~ SOLN
30.0000 mg | SUBCUTANEOUS | Status: DC
  Administered 2013-06-04 – 2013-06-05 (×2): 30 mg via SUBCUTANEOUS
  Filled 2013-06-04 (×3): qty 0.3

## 2013-06-04 MED ORDER — ISOSORBIDE MONONITRATE ER 60 MG PO TB24
90.0000 mg | ORAL_TABLET | Freq: Every day | ORAL | Status: DC
  Administered 2013-06-04 – 2013-06-07 (×4): 90 mg via ORAL
  Filled 2013-06-04 (×4): qty 1

## 2013-06-04 MED ORDER — PANTOPRAZOLE SODIUM 40 MG PO TBEC
40.0000 mg | DELAYED_RELEASE_TABLET | Freq: Every day | ORAL | Status: DC
  Administered 2013-06-05 – 2013-06-07 (×3): 40 mg via ORAL
  Filled 2013-06-04 (×3): qty 1

## 2013-06-04 NOTE — Telephone Encounter (Signed)
chilon has found this to be an out of pocket expense, she has informed pt

## 2013-06-04 NOTE — Telephone Encounter (Signed)
Spoke with chilonb. This am, she states she will find out what is needed and if [facility] can do this, could be a reclining lift chair? chilonb will let us know what is needed

## 2013-06-04 NOTE — Progress Notes (Signed)
Patient ID: [name], female   DOB: [birth year], 53 y.o.   MRN: [id number] PCP: Dr. Bosie Clos  53 yo with systolic CHF probably secondary to severe MR now s/p MV repair, rheumatoid arthritis, schizophrenia, overdose of seroquel 2013, S/P St Jude ICD 2013, and PUD.   She returns for follow up. She received IV lasix 12/18, 12/22, and 05/24/13. Weight at home continued to trend up to 398 pounds. Complaining of increased fatigue and dyspnea on exertion. Denies PND. + Orthopnea. Sleeps sitting on the couch due to back pain.  Not using CPAP. Drinking > 2 liters per day. Followed by [facility].    Labs 04/02/13 K 4.1 Creatinine 1.96   Allergies:  1) ! * Colchicine  2) ! Morphine   Past History:  1. Congestive heart failure, most likely secondary to severe mitral regurgitation. TEE (6/10): EF 40%, diffuse hypokinesis, mild to moderately dilated LV, severe eccentric MR, small PFO. Cardiac MRI (6/10): EF 37% with global hypokinesis, moderate to severe MR, no delayed enhancement (no evidence for sarcoidosis or other infiltrative disease). TTE (10/10) after MV repair: EF 25-30%, moderately dilated LV, moderate diastolic dysfunction, mildly depressed RV function, trivial MR, MV mean gradient 5 mmHg, PASP 30 mmHg. RHC (12/10): mean RA 19, PA 46/26, mean PCWP 28, CI 2.5, SVO2 63%. TTE (2/11): EF 35-40% with diffuse hypokinesis, no significant MR or MS. TTE (7/11): EF 45%, mild global hypokinesis, s/p MV repair, no mitral regurgitation, minimal mitral stenosis, PA systolic pressure 40 mmHg, mild LV dilation.  TTE (11/12): EF 30-35%, mild LV dilation, mild LVH, s/p MV repair with no regurgitation and minimal stenosis.  TTE (3/13): EF 25-30%, diffuse hypokinesis, no significant mitral stenosis by pressure halftime with mean gradient 7 mmHg across valve, no MR.  Echo (6/13): EF 30-35%, mildly dilated LV, mild mitral stenosis but no MR.  St Jude ICD placed in 8/13. RHC (5/14): mean RA 10, PA 23/10, mean PCWP 17, CI 2.17.   Echo (11/14) with EF 40%, stable repaired MV, mild to moderately dilated RV with mildly decreased systolic function.  2. Severe mitral regurgitation (see above). Minimally invasive mitral valve repair on 12/18/08. She additionally had TV repair and PFO closure.  3. Microcytic anemia: EGD and colonoscopy in 12/11 did not reveal source of bleeding.  4. H/o gastric ulcers.  5. Morbid obesity.  6. Obstructive sleep apnea, on CPAP.  7. GERD.  8. Hypertension, controlled.  9. Rheumatoid arthritis  10. Gout.  11. Depression.  12. LHC (6/10): No angiographic CAD. Mildly dilated LV. 3+ MR. EF 40%.  13. Prior smoking, quit  14. Colitis (2/11)  15. Schizophrenia versus schizoaffective disorder 16. History of right leg DVT  17. Syncope while coughing.   Family History:  Negative for cardiac or renal disease.  No FH of Colon Cancer  Social History:  Single, 5 children. Unemployed.  Tobacco Use - Yes. Smoked < 1 ppd, quit 6/10.  Alcohol Use - no  Regular Exercise - no  Drug Use - no, hx crack-cocaine use  Daily Caffeine Use: 2 daily   Review of Systems  All systems reviewed and negative except as per HPI.  Current Outpatient Prescriptions  Medication Sig Dispense Refill  . amitriptyline (ELAVIL) 150 MG tablet Take 1 tablet (150 mg total) by mouth at bedtime as needed for sleep.  30 tablet  5  . aspirin EC 81 MG tablet Take 81 mg by mouth daily.      . carvedilol (COREG) 12.5 MG tablet TAKE  ONE AND ONE-HALF TABLETS BY MOUTH TWICE DAILY WITH A MEAL  90 tablet  0  . dextromethorphan-guaiFENesin (MUCINEX DM) 30-600 MG per 12 hr tablet Take 1 tablet by mouth 2 (two) times daily.  28 tablet  0  . docusate sodium (COLACE) 100 MG capsule Take 100 mg by mouth daily.      Marland Kitchen esomeprazole (NEXIUM) 20 MG capsule Take 1 capsule (20 mg total) by mouth 2 (two) times daily before a meal.  180 capsule  3  . febuxostat (ULORIC) 40 MG tablet Take 80 mg by mouth.      . ferrous sulfate 325 (65 FE) MG tablet  Take 1 tablet (325 mg total) by mouth 2 (two) times daily with a meal.  180 tablet  1  . hydrALAZINE (APRESOLINE) 50 MG tablet Take 1 tablet (50 mg total) by mouth 3 (three) times daily.  90 tablet  6  . HYDROcodone-acetaminophen (NORCO) 10-325 MG per tablet Take 1 tablet by mouth every 6 (six) hours as needed for severe pain.  120 tablet  0  . isosorbide mononitrate (IMDUR) 60 MG 24 hr tablet TAKE ONE AND ONE-HALF TABLETS BY MOUTH ONCE DAILY  45 tablet  0  . metolazone (ZAROXOLYN) 2.5 MG tablet Take 5 mg 30 min before torsemide every Monday Wednesday and Friday.      . potassium chloride SA (K-DUR,KLOR-CON) 20 MEQ tablet Take 2 tablets (40 mEq total) by mouth 2 (two) times daily.  120 tablet  3  . spironolactone (ALDACTONE) 25 MG tablet TAKE ONE TABLET BY MOUTH ONCE DAILY  30 tablet  6  . torsemide (DEMADEX) 100 MG tablet Take 1 tablet (100 mg total) by mouth 2 (two) times daily.  60 tablet  6  . [DISCONTINUED] QUEtiapine (SEROQUEL) 100 MG tablet Take 400 mg by mouth at bedtime.        No current facility-administered medications for this encounter.   Filed Vitals:   06/04/13 1607  BP: 110/78  Pulse: 88  Weight: 386 lb (175.088 kg)  SpO2: 96%   General: NAD, obese. Sitting in WC Neck: Thick neck, JVP difficult to assess d/t body habitus , no thyromegaly or thyroid nodule.  Lungs: decreased in the bases CV: Nondisplaced PMI.  Heart regular S1/S2, no S3/S4, no murmur.   No carotid bruit.  Normal pedal pulses.  Abdomen: Soft, markedly obese. nontender, +++distention.  Neurologic: Alert and oriented x 3. Moves all 4 without difficulty Psych: Normal affect. Extremities: No clubbing or cyanosis. Legs wrapped bilaterally.   Assessment/Plan 1Acute/Chronic systolic heart failure. NICM. ST Jude ICD. ECHO results pending from today.  NYHA IIIB. Volume status elevated despite escalating diuretics. Received 80 mg IV lasix at home over the month.  - Continue current Coreg, imdur, hydralazine and  spironolactone at current doses.   She is not on Ace or Arb due to elevated creatinine  Admit today to telemetry and diurese with Lasix drip. Discussed with Dr Gala Romney and he agrees.   CLEGG,AMY NP-C 4:23 PM

## 2013-06-04 NOTE — Patient Instructions (Signed)
Pt to be admitted for further evaluation of CHF Cpt CNOB:[id number] With pts current insurance PRIMARY Medicare A and B SECONDARY Medicaid No pre cert is req'd

## 2013-06-04 NOTE — H&P (Signed)
ADVANCED HEART FAILURE ADMIT   HPI: Ms [name] is a 53 year old with a history of systolic CHF thought to be secondary to severe MR now s/p MV repair, rheumatoid arthritis, schizophrenia, obesity, overdose of seroquel 2013, S/P St Jude ICD 2013, and PUD. She was last admitted to [facility] 09/2012 and required milrinone along with lasix to diurese due to cardiorenal syndrome.    RHC 09/2012  RA mean 10  RV 28/5  PA 23/10, mean 15  PCWP mean 17  PA 57%  AO 100%  Cardiac Output (Fick) 5.7  Cardiac Index (Fick) 2.17   Today she was evaluated in the HF clinic due to increased fatigue and dyspnea on exertion. She received 80 mg  IV lasix 12/18, 12/22, and 05/24/13 by [facility] but her weight continued to climb. Weight at home was trending  up to 398 pounds. (baseline ~380) Complaining of increased fatigue and dyspnea on exertion. Denies PND. + Orthopnea. Sleeps sitting on the couch due to back pain. Not using CPAP. Drinking > 2 liters per day. Eating high salt foods. Followed by [facility]  Echo today EF ~55%. RV mild to moderately HK.   Family History:  Negative for cardiac or renal disease.  No FH of Colon Cancer   Social History:  Single, 5 children. Unemployed.  Tobacco Use - Yes. Smoked < 1 ppd, quit 6/10.  Alcohol Use - no  Regular Exercise - no  Drug Use - no, hx crack-cocaine use  Daily Caffeine Use: 2 daily      Review of Systems:     Cardiac Review of Systems: {Y] = yes [ ]  = no  Chest Pain [    ]  Resting SOB [   ] Exertional SOB  [Y  ]  Orthopnea [  Y]   Pedal Edema [  Y ]    Palpitations [  ] Syncope  [  ]   Presyncope [   ]  General Review of Systems: [Y] = yes [  ]=no Constitional: recent weight change [  ]; anorexia [  ]; fatigue [ Y ]; nausea [  ]; night sweats [  ]; fever [  ]; or chills [  ];                                                                                                                                          Dental: poor dentition[  ]; Last Dentist visit:   Eye :  blurred vision [  ]; diplopia [   ]; vision changes [  ];  Amaurosis fugax[  ]; Resp: cough [  ];  wheezing[  ];  hemoptysis[  ]; shortness of breath[  Y]; paroxysmal nocturnal dyspnea[  ]; dyspnea on exertion[Y  ]; or orthopnea[Y  ];  GI:  gallstones[  ], vomiting[  ];  dysphagia[  ]; melena[  ];  hematochezia [  ];  heartburn[  ];   Hx of  Colonoscopy[  ]; GU: kidney stones [  ]; hematuria[  ];   dysuria [  ];  nocturia[  ];  history of     obstruction [  ];                 Skin: rash, swelling[  ];, hair loss[  ];  peripheral edema[  ];  or itching[  ]; Musculosketetal: myalgias[  ];  joint swelling[  ];  joint erythema[  ];  joint pain[Y  ];  back pain[ Y ];  Heme/Lymph: bruising[  ];  bleeding[  ];  anemia[  ];  Neuro: TIA[  ];  headaches[  ];  stroke[  ];  vertigo[  ];  seizures[  ];   paresthesias[  ];  difficulty walking[  ];  Psych:depression[  ]; anxiety[  ];  Endocrine: diabetes[  ];  thyroid dysfunction[  ];  Immunizations: Flu [  ]; Pneumococcal[  ];  Other:  Past Medical History  Diagnosis Date  . Severe mitral regurgitation     a. Minimally invasive MV repair on 12/18/08. Additionally TV repair  and PFO closure.  . Chronic systolic CHF (congestive heart failure)     a. 12/2011 Echo: EF 30%, diff HK, mild LVH, Gr 1 DD, mildly dil LA. b. 2/2 NICM.  Marland Kitchen Microcytic anemia     a. Small capsule endoscopy 2012 with gastric erosion and small colonic AVMs. b. EGD and colonoscopy before that in 12/11 did not reveal source of bleeding)  . Gastric ulcer   . NICM (nonischemic cardiomyopathy)     a.12/2011: s/p SJM [id number] single lead ICD, Ser #: [id number]  . Obstructive sleep apnea     on CPAP  . GERD (gastroesophageal reflux disease)   . HTN (hypertension)   . Gout   . Depression   . Colitis 2/11  . Chronic back pain   . Chronic knee pain   . DVT (deep venous thrombosis)     right leg  . Anxiety   . Rheumatoid arthritis(714.0)     "shoulders & knees"  . Sleep disturbance      07/14/11 "haven't slept in 10 months; since going to [facility]"  . Hx: UTI (urinary tract infection)   . Nocturia   . COPD (chronic obstructive pulmonary disease)   . Bronchitis   . PTSD (post-traumatic stress disorder)   . Schizophrenia   . Dysmenorrhea   . Morbid obesity   . Acute renal insufficiency     a. ?Cardiorenal 09/2012.  Marland Kitchen Hyperglycemia     No prescriptions prior to admission     Allergies  Allergen Reactions  . Colchicine Itching and Other (See Comments)    Whelps.  . Morphine Itching  . Risperidone Other (See Comments)    Auditory hallucinations.  . Shrimp [Shellfish Allergy] Hives    Break out in whelps and itch.    History   Social History  . Marital Status: Single    Spouse Name: N/A    Number of Children: N/A  . Years of Education: N/A   Occupational History  . Not on file.   Social History Main Topics  . Smoking status: Former Smoker -- 0.50 packs/day for 27 years    Types: Cigarettes    Quit date: 12/18/2008  . Smokeless tobacco: Never Used  . Alcohol Use: No     Comment: "stopped drinking alcohol 12/2003; did drink ~ 6 shots/wk"  . Drug Use:  No     Comment: "last cocaine use 2009"  . Sexual Activity: Not on file   Other Topics Concern  . Not on file   Social History Narrative   Pt lives in [place] with her dtr.  On disability.    Family History  Problem Relation Age of Onset  . Hypertension Sister   . Alcoholism Father     died in his 42's or 65's  . Other Mother     alive & well @ 43.    PHYSICAL EXAM: Filed Vitals:   06/04/13 1900  BP: 114/79  Pulse: 97  Temp: 97.6 F (36.4 C)   General: NAD, obese.  Neck: Thick neck, JVP difficult to assess d/t body habitus, no thyromegaly or thyroid nodule.  Lungs: Clear to auscultation bilaterally with normal respiratory effort.  CV: Nondisplaced PMI. Heart regular S1/S2, no S3/S4, no murmur. No carotid bruit. Normal pedal pulses.  Abdomen: Soft, markedly obese. nontender, no distention.   Neurologic: Alert and oriented x 3. Moves all 4 without difficulty  Psych: Normal affect.  Extremities: No clubbing or cyanosis. R and LLE 3+ edema.   No results found for this or any previous visit (from the past 24 hour(s)).    ASSESSMENT: 1. A/C Diastolic Heart Failure -ST Jude ICD. ECHO completed today  2. 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 Obesity 4. OSA- intolerant CPAP.    PLAN/DISCUSSION: Ms [name] is being admitted to telemetry from HF clinic today with volume overload likely due to dietary/fluid noncomplinace. Plan to diurese with lasix drip at 10 mg per hour. May need Milrinone to assist with diuresis but for now start with lasix drip. Cut back carvedilol to 12.5 mg twice a day. Will not use ACE//ARB due to [facility] labs.   Will need to resume [facility]HC and paramedicine at discharge.   Consult PT/dietitian.   CLEGG,AMY 4:46 PM  Patient seen and examined with Tonye Becket, NP. We discussed all aspects of the encounter. I agree with the assessment and plan as stated above.   Ms Buechner is failing outpatient management of her HF even despite frequent doses of home IV lasix. Suspect most of this is due to severe noncompliance. I reviewed echo personally today and EF now near normal. Agree with admission for IV diuresis with lasix gtt and metolazone.   Reuel Boom Shaneisha Burkel,MD 7:42 PM

## 2013-06-04 NOTE — Progress Notes (Signed)
Echo Lab  2D Echocardiogram completed.  Octavian Godek L Phala Schraeder, RDCS 06/04/2013 4:19 PM

## 2013-06-05 ENCOUNTER — Encounter (HOSPITAL_COMMUNITY): Payer: Self-pay | Admitting: General Practice

## 2013-06-05 DIAGNOSIS — I059 Rheumatic mitral valve disease, unspecified: Secondary | ICD-10-CM

## 2013-06-05 DIAGNOSIS — M79609 Pain in unspecified limb: Secondary | ICD-10-CM

## 2013-06-05 DIAGNOSIS — M7989 Other specified soft tissue disorders: Secondary | ICD-10-CM

## 2013-06-05 LAB — BASIC METABOLIC PANEL
BUN: 45 mg/dL — ABNORMAL HIGH (ref 6–23)
BUN: 44 mg/dL — ABNORMAL HIGH (ref 6–23)
CALCIUM: 9.5 mg/dL (ref 8.4–10.5)
CO2: 33 mEq/L — ABNORMAL HIGH (ref 19–32)
CO2: 28 mEq/L (ref 19–32)
Calcium: 9.3 mg/dL (ref 8.4–10.5)
Chloride: 90 mEq/L — ABNORMAL LOW (ref 96–112)
Chloride: 88 mEq/L — ABNORMAL LOW (ref 96–112)
Creatinine, Ser: 1.7 mg/dL — ABNORMAL HIGH (ref 0.50–1.10)
Creatinine, Ser: 1.81 mg/dL — ABNORMAL HIGH (ref 0.50–1.10)
GFR, EST AFRICAN AMERICAN: 39 mL/min — AB (ref >90)
GFR, EST AFRICAN AMERICAN: 36 mL/min — AB (ref >90)
GFR, EST NON AFRICAN AMERICAN: 33 mL/min — AB (ref >90)
GFR, EST NON AFRICAN AMERICAN: 31 mL/min — AB (ref >90)
GLUCOSE: 220 mg/dL — AB (ref 70–99)
Glucose, Bld: 169 mg/dL — ABNORMAL HIGH (ref 70–99)
POTASSIUM: 3.1 meq/L — AB (ref 3.7–5.3)
POTASSIUM: 5 meq/L (ref 3.7–5.3)
SODIUM: 133 meq/L — AB (ref 137–147)
Sodium: 139 mEq/L (ref 137–147)

## 2013-06-05 LAB — MRSA PCR SCREENING: MRSA by PCR: NEGATIVE

## 2013-06-05 MED ORDER — POTASSIUM CHLORIDE CRYS ER 20 MEQ PO TBCR
40.0000 meq | EXTENDED_RELEASE_TABLET | Freq: Once | ORAL | Status: AC
  Administered 2013-06-05: 40 meq via ORAL

## 2013-06-05 MED ORDER — METOLAZONE 5 MG PO TABS
5.0000 mg | ORAL_TABLET | Freq: Once | ORAL | Status: AC
  Administered 2013-06-05: 5 mg via ORAL
  Filled 2013-06-05: qty 1

## 2013-06-05 NOTE — Progress Notes (Signed)
UR completed Doreather Hoxworth K. Daily Doe, RN, BSN, MSHL, CCM  06/05/2013 10:58 AM

## 2013-06-05 NOTE — Progress Notes (Signed)
Patient ID: [name], female   DOB: [birth year], 53 y.o.   MRN: [id number]   SUBJECTIVE: Complains of bilateral calf pain this morning.  Hard to tell how much diuresis she had, was incontinent.    Filed Vitals:   06/04/13 1900 06/04/13 2015 06/05/13 0516  BP: 114/79 112/35 118/58  Pulse: 97 100 97  Temp: 97.6 F (36.4 C) 98.2 F (36.8 C) 98 F (36.7 C)  TempSrc:  Oral Oral  Resp:   20  Weight:   174.2 kg (384 lb 0.7 oz)  SpO2: 98% 100% 99%    Intake/Output Summary (Last 24 hours) at 06/05/13 0826 Last data filed at 06/05/13 0600  Gross per 24 hour  Intake    360 ml  Output    925 ml  Net   -565 ml    LABS: Basic Metabolic Panel:  Recent Labs  96/75/91 2015 06/05/13 0602  NA 139 139  K 3.4* 3.1*  CL 90* 90*  CO2 35* 33*  GLUCOSE 171* 169*  BUN 45* 45*  CREATININE 1.74* 1.70*  CALCIUM 10.0 9.5  MG 1.8  --    Liver Function Tests:  Recent Labs  06/04/13 2015  AST 24  ALT 30  ALKPHOS 193*  BILITOT 0.3  PROT 8.7*  ALBUMIN 3.1*   No results found for this basename: LIPASE, AMYLASE,  in the last 72 hours CBC:  Recent Labs  06/04/13 2015  WBC 6.0  NEUTROABS 4.1  HGB 11.0*  HCT 35.5*  MCV 79.4  PLT 330   Cardiac Enzymes: No results found for this basename: CKTOTAL, CKMB, CKMBINDEX, TROPONINI,  in the last 72 hours BNP: No components found with this basename: POCBNP,  D-Dimer: No results found for this basename: DDIMER,  in the last 72 hours Hemoglobin A1C: No results found for this basename: HGBA1C,  in the last 72 hours Fasting Lipid Panel: No results found for this basename: CHOL, HDL, LDLCALC, TRIG, CHOLHDL, LDLDIRECT,  in the last 72 hours Thyroid Function Tests: No results found for this basename: TSH, T4TOTAL, FREET3, T3FREE, THYROIDAB,  in the last 72 hours Anemia Panel: No results found for this basename: VITAMINB12, FOLATE, FERRITIN, TIBC, IRON, RETICCTPCT,  in the last 72 hours  RADIOLOGY: No results found.  PHYSICAL  EXAM General: NAD, obese Neck: JVP 12 cm, no thyromegaly or thyroid nodule.  Lungs: Clear to auscultation bilaterally with normal respiratory effort. CV: Nondisplaced PMI.  Heart regular S1/S2, no S3/S4, no murmur.  2+ edema to knees bilaterally.   Abdomen: Soft, nontender, no hepatosplenomegaly, no distention.  Neurologic: Alert and oriented x 3.  Psych: Normal affect. Extremities: No clubbing or cyanosis.   TELEMETRY: Reviewed telemetry pt in NSR  ASSESSMENT AND PLAN: [name] is a 53 year old with a history of systolic CHF thought to be secondary to severe MR now s/p MV repair, rheumatoid arthritis, schizophrenia, obesity, overdose of seroquel 2013, S/P St Jude ICD 2013, and PUD. She was last admitted to [facility] 09/2012 and required milrinone along with lasix to diurese due to cardiorenal syndrome. She was admitted 1/21 with acute/chronic diastolic CHF.  Echo 1/21 showed improvement in EF to 50-55%.  1. Acute/chronic diastolic CHF: Patient is quit volume overloaded.   - Continue Lasix gtt today and will give a dose of metolazone 5 mg x 1.  - Replete K aggressively.  2. CKD: Creatinine stable compared to prior (baseline 1.7-2 range).  3. H/o MV repair: Stable on echo.  4. Leg pain: Tender  calves with increased edema.  Will get lower extremity US to rule out DVT.   [name] 06/05/2013 8:30 AM

## 2013-06-05 NOTE — Progress Notes (Signed)
CARDIAC REHAB PHASE I   PRE:  Rate/Rhythm: 94 SR    BP: sitting 110/69    SaO2: 97 RA  MODE:  Ambulation: 20 ft in hall   POST:  Rate/Rhythm: 105 ST    BP: sitting 112/67     SaO2: 99 RA  Pt very reluctant to walk due to knee pain. Sts she mostly stays in her chair all day at home. Only walks to BR. Uses w/c if she has a MD appt. Used RW to ambulate. Leaned over RW due to knee pain. Took multiple standing rests. Followed with chair and she sat when c/o back pain along with knee pain.  Used assist x2 for equipment and safety but pt supported herself. Pt is not able to walk well due to orthopedic issues. Will f/u for education. RD in now. [phone]  Elissa Lovett [place] CES, ACSM 06/05/2013 1:51 PM

## 2013-06-05 NOTE — Care Management Note (Addendum)
  Page 2 of 2   06/07/2013     4:08:00 PM   CARE MANAGEMENT NOTE 06/07/2013  Patient:  [name], [name]   Account Number:  [id number]  Date Initiated:  06/05/2013  Documentation initiated by:  Donato Schultz  Subjective/Objective Assessment:   Admitted with CHF     Action/Plan:   CM Consult   Anticipated DC Date:  06/05/2013   Anticipated DC Plan:  HOME W HOME HEALTH SERVICES      DC Planning Services  CM consult      [facility] Choice  HOME HEALTH   Choice offered to / List presented to:  NA           [facility] agency  [facility]   Status of service:  Completed, signed off Medicare Important Message given?   (If response is "NO", the following Medicare IM given date fields will be blank) Date Medicare IM given:   Date Additional Medicare IM given:    Discharge Disposition:    Per UR Regulation:  Reviewed for med. necessity/level of care/duration of stay  If discussed at Long Length of Stay Meetings, dates discussed:    Comments:  06/07/2013 CMA Benefits Check Request: Please look into Eliquis vs Xarelto price for patient. She has possible DVT. *Eliquis 5mg  BID  (Not covered) *Xarelto 15 mg bid x 21 days and 20mg  qd thereafter. ($45.00 for 30 days) and $2.45 for 21 days) [facility] or [facility]  PT RECS:  HH PT DME RECS: None CM Consult with patient.  CM advised in pharmacy information.   Patient confirms awareness of P4CC but refused stating she does not need any additional services d/t active with [facility] and HH/[facility] Disposition Plan:  HHS/[facility]HC - RN, PT  (Disease MGMT: CHF and Medication MGMT)  [facility] / [facility]  notified. ADD: + 0-1 Maxi Carreras RN, BSN, [place], CCM ([place] 06/07/2013    06/05/2013 Admitted from HF clinic From Home with children.  Hx/o non-compliant with diet/food selections. Today:  continuous IV Lasix gtt, Lovenox injections HHS:  Active with HHS / [facility] (Hx/o Home IV Lasix) Active with [facility] [place] Access:  Patient  elligible for Partnership for 06/07/2013 Care [facility]) Disposition Plan:  Resume HHS at d/c [employer] RN, BSN, MSHL, CCM [phone] Unit) [phone]

## 2013-06-05 NOTE — Progress Notes (Signed)
06/05/13 Patient MRSA swab showed a negative result.  Patient removed from isolation.  Anastasia Fiedler RN

## 2013-06-05 NOTE — Progress Notes (Signed)
[facility]  Patient Status: Active (receiving services up to time of hospitalization)  [facility] is providing the following services: RN  If patient discharges after hours, please call [phone].   Kizzie Furnish 06/05/2013, 11:07 AM

## 2013-06-05 NOTE — Progress Notes (Signed)
Nutrition Education Note  RD consulted for nutrition education.   RD provided "Low Sodium Nutrition Therapy" handout from the Academy of Nutrition and Dietetics. Reviewed patient's dietary recall. Provided examples on ways to decrease sodium intake in diet. Discouraged intake of processed foods and use of salt shaker. Encouraged intake of fresh fruits and vegetables. Pt states she has received diet education before and she knows what foods to avoid but, struggles. She states that her daughter does most of the shopping and typically buys high sodium foods.  Gavee pt "Heart Healthy Shopping Tips" and "Sodium Free Flavoring tips" for pt to share with her daughter.   RD discussed why it is important for patient to adhere to diet recommendations, and emphasized the role of fluids and foods to avoid. Pt reports being thirsty all the time. Discussed thirst-quenching tips with pt. Teach back method used.  Expect poor compliance.  Body mass index is 62.02 kg/(m^2). Pt meets criteria for Morbid Obesity based on current BMI.  Current diet order is Heart Healthy, patient is consuming approximately 100% of meals at this time. Labs and medications reviewed. No further nutrition interventions warranted at this time. RD contact information provided. If additional nutrition issues arise, please re-consult RD.   Ian Malkin RD, LDN Inpatient Clinical Dietitian Pager: [phone] After Hours Pager: [phone]

## 2013-06-05 NOTE — Progress Notes (Addendum)
*  Preliminary Results* Bilateral lower extremity venous duplex completed. The study was technically difficult and limited due to patient body habitus, depth of vessels, and poor patient cooperation.  The right femoral, right popliteal, and left common femoral veins are noncompressible, suggestive of possible thrombosis, however this may be from extreme patient guarding. The right saphenofemoral junction demonstrates rouleaux flow.  Incidental finding:  There is a 2.9cm anechoic lesion of the right groin, with faint internal echoes vs artifact. This is suggestive of a possible cyst versus unknown etiology.  06/05/2013  Gertie Fey, RVT, RDCS, RDMS

## 2013-06-06 LAB — URINALYSIS, ROUTINE W REFLEX MICROSCOPIC
Bilirubin Urine: NEGATIVE
Glucose, UA: NEGATIVE mg/dL
Hgb urine dipstick: NEGATIVE
KETONES UR: NEGATIVE mg/dL
LEUKOCYTES UA: NEGATIVE
NITRITE: NEGATIVE
Protein, ur: NEGATIVE mg/dL
SPECIFIC GRAVITY, URINE: 1.009 (ref 1.005–1.030)
Urobilinogen, UA: 0.2 mg/dL (ref 0.0–1.0)
pH: 7 (ref 5.0–8.0)

## 2013-06-06 LAB — BASIC METABOLIC PANEL WITH GFR
BUN: 48 mg/dL — ABNORMAL HIGH (ref 6–23)
CO2: 31 meq/L (ref 19–32)
Calcium: 9.9 mg/dL (ref 8.4–10.5)
Chloride: 90 meq/L — ABNORMAL LOW (ref 96–112)
Creatinine, Ser: 1.84 mg/dL — ABNORMAL HIGH (ref 0.50–1.10)
GFR calc Af Amer: 35 mL/min — ABNORMAL LOW
GFR calc non Af Amer: 30 mL/min — ABNORMAL LOW
Glucose, Bld: 156 mg/dL — ABNORMAL HIGH (ref 70–99)
Potassium: 5 meq/L (ref 3.7–5.3)
Sodium: 137 meq/L (ref 137–147)

## 2013-06-06 LAB — D-DIMER, QUANTITATIVE: D-Dimer, Quant: 3.41 ug/mL-FEU — ABNORMAL HIGH (ref 0.00–0.48)

## 2013-06-06 MED ORDER — ENOXAPARIN SODIUM 40 MG/0.4ML ~~LOC~~ SOLN
40.0000 mg | SUBCUTANEOUS | Status: DC
  Administered 2013-06-06: 40 mg via SUBCUTANEOUS
  Filled 2013-06-06 (×2): qty 0.4

## 2013-06-06 MED ORDER — METOLAZONE 5 MG PO TABS
5.0000 mg | ORAL_TABLET | Freq: Two times a day (BID) | ORAL | Status: DC
  Administered 2013-06-06 (×2): 5 mg via ORAL
  Filled 2013-06-06 (×4): qty 1

## 2013-06-06 NOTE — Progress Notes (Signed)
Patient active with [place] Management services. She is also active with [place]. Met with patient at bedside to make aware that [facility] will continue to follow. Discussed the importance of dietary compliance. Admits to eating pickles and pork rinds prior to admission. States she was craving the salty food and that she knew better. States she plans on doing better because " I do not like being in the hospital". She will be followed post hospital discharge. Appreciative of visit.  Marthenia Rolling, MSN, RN, BSN- [facility] [phone]

## 2013-06-06 NOTE — Progress Notes (Signed)
Patient ID: [name], female   DOB: 1960/07/16, 53 y.o.   MRN: [id number]   SUBJECTIVE: Having calf pain and bilateral LE duplex showed possible thrombosis right femoral, right popliteal and left femoral veins, however they were non-compressible. Weight up 2 lbs and 24 hr I/O -1.8 liters. Cr stable 1.84. Denies SOB or CP. Complaints of dysuria and calf pain.   Filed Vitals:   06/05/13 2018 06/06/13 0206 06/06/13 0335 06/06/13 0700  BP: 112/72 126/58  110/67  Pulse: 88 98  95  Temp:    98.4 F (36.9 C)  TempSrc:    Oral  Resp: 20 18  18   Height:      Weight:   386 lb 9.6 oz (175.361 kg)   SpO2: 97% 95%  97%    Intake/Output Summary (Last 24 hours) at 06/06/13 0859 Last data filed at 06/06/13 0752  Gross per 24 hour  Intake 1416.83 ml  Output   3726 ml  Net -2309.17 ml    LABS: Basic Metabolic Panel:  Recent Labs  06/08/13 2015  06/05/13 1848 06/06/13 0343  NA 139  < > 133* 137  K 3.4*  < > 5.0 5.0  CL 90*  < > 88* 90*  CO2 35*  < > 28 31  GLUCOSE 171*  < > 220* 156*  BUN 45*  < > 44* 48*  CREATININE 1.74*  < > 1.81* 1.84*  CALCIUM 10.0  < > 9.3 9.9  MG 1.8  --   --   --   < > = values in this interval not displayed. Liver Function Tests:  Recent Labs  06/04/13 2015  AST 24  ALT 30  ALKPHOS 193*  BILITOT 0.3  PROT 8.7*  ALBUMIN 3.1*   No results found for this basename: LIPASE, AMYLASE,  in the last 72 hours CBC:  Recent Labs  06/04/13 2015  WBC 6.0  NEUTROABS 4.1  HGB 11.0*  HCT 35.5*  MCV 79.4  PLT 330   Cardiac Enzymes: No results found for this basename: CKTOTAL, CKMB, CKMBINDEX, TROPONINI,  in the last 72 hours BNP: No components found with this basename: POCBNP,  D-Dimer: No results found for this basename: DDIMER,  in the last 72 hours Hemoglobin A1C: No results found for this basename: HGBA1C,  in the last 72 hours Fasting Lipid Panel: No results found for this basename: CHOL, HDL, LDLCALC, TRIG, CHOLHDL, LDLDIRECT,  in the  last 72 hours Thyroid Function Tests: No results found for this basename: TSH, T4TOTAL, FREET3, T3FREE, THYROIDAB,  in the last 72 hours Anemia Panel: No results found for this basename: VITAMINB12, FOLATE, FERRITIN, TIBC, IRON, RETICCTPCT,  in the last 72 hours  RADIOLOGY: No results found.  PHYSICAL EXAM General: NAD, obese Neck: JVP difficult to assess d/t body habitus; no thyromegaly or thyroid nodule.  Lungs: Clear to auscultation bilaterally with normal respiratory effort. CV: Nondisplaced PMI.  Heart regular S1/S2, no S3/S4, no murmur.  2+ edema to knees bilaterally.   Abdomen: Soft, nontender, no hepatosplenomegaly, no distention.  Neurologic: Alert and oriented x 3.  Psych: Normal affect. Extremities: No clubbing or cyanosis.   TELEMETRY: Reviewed telemetry pt in NSR  ASSESSMENT AND PLAN: [name] 06/06/13 is a 53 year old with a history of systolic CHF thought to be secondary to severe MR now s/p MV repair, rheumatoid arthritis, schizophrenia, obesity, overdose of seroquel 2013, S/P St Jude ICD 2013, and PUD. She was last admitted to [facility] 09/2012 and required milrinone along  with lasix to diurese due to cardiorenal syndrome. She was admitted 1/21 with acute/chronic diastolic CHF.  Echo 1/21 showed improvement in EF to 50-55%.  1. Acute/chronic diastolic CHF: Very difficult to assess volume status d/t body habitus. She still appears to have some volume on board.   - Will continue lasix gtt 10 mg/hr and will give metolazone 5 mg BID. - Hold potassium. 2. CKD: Creatinine stable compared to prior (baseline 1.7-2 range). Continue to follow 3. H/o MV repair: Stable on echo.  4. Leg pain: Tender calves with increased edema. Duplex not definitive will order d-dimer.   5. Dysuria: UA ok  [name] NP-C 06/06/2013 8:59 AM  Patient seen and examined with [name], NP. We discussed all aspects of the encounter. I agree with the assessment and plan as stated above. Volume status  appears fairly close to baseline. Will continue IV diuresis one more day. Possibly home tomorrow. No clear evidence of DVT.   [name],MD 4:20 PM

## 2013-06-06 NOTE — Evaluation (Signed)
Physical Therapy Evaluation Patient Details Name: [name] MRN: [id number] DOB: Aug 14, 1960 Today's Date: 06/06/2013 Time: 8144-8185 PT Time Calculation (min): 20 min  PT Assessment / Plan / Recommendation History of Present Illness  Ms [name] is a 53 year old with a history of systolic CHF thought to be secondary to severe MR now s/p MV repair, rheumatoid arthritis, schizophrenia, obesity, overdose of seroquel 2013, S/P St Jude ICD 2013, and PUD. She was last admitted to [facility] 09/2012 and required milrinone along with lasix to diurese due to cardiorenal syndrome.  Clinical Impression  Pt will benefit from PT in acute setting to address deficits below; She is quite deconditioned, although admittedly does minimal activity at baseline; Eval limited due to pt with inconclusive dopplers and pending Ddimer (r/o LE DVT); Given pt size it is imperative that she continue mobility during hospital stay; Will continue to follow    PT Assessment  Patient needs continued PT services    Follow Up Recommendations  Home health PT    Does the patient have the potential to tolerate intense rehabilitation      Barriers to Discharge        Equipment Recommendations  None recommended by PT    Recommendations for Other Services     Frequency Min 3X/week    Precautions / Restrictions Precautions Precautions: Fall Restrictions Weight Bearing Restrictions: No   Pertinent Vitals/Pain C/o bil knee pain, chronic in nature per pt      Mobility  Transfers Overall transfer level: Needs assistance Equipment used: Rolling walker (2 wheeled) Transfers: Sit to/from Stand Sit to Stand: Min assist General transfer comment: incr time and 3 attempts to stand; verbal cues for safety, hand placement, control descent    Exercises     PT Diagnosis: Difficulty walking  PT Problem List: Decreased activity tolerance;Decreased balance;Decreased mobility PT Treatment Interventions: DME instruction;Gait  training;Functional mobility training;Therapeutic activities;Therapeutic exercise;Patient/family education     PT Goals(Current goals can be found in the care plan section) Acute Rehab PT Goals Patient Stated Goal: wants to have gastric bypass and have her knees replaced PT Goal Formulation: With patient Time For Goal Achievement: 06/13/13 Potential to Achieve Goals: Fair  Visit Information  Last PT Received On: 06/06/13 Assistance Needed: +2 (stood with +1 on eval; +2 helpful due to pt size/safety) History of Present Illness: Ms [name] is a 53 year old with a history of systolic CHF thought to be secondary to severe MR now s/p MV repair, rheumatoid arthritis, schizophrenia, obesity, overdose of seroquel 2013, S/P St Jude ICD 2013, and PUD. She was last admitted to [facility] 09/2012 and required milrinone along with lasix to diurese due to cardiorenal syndrome.       Prior Functioning  Home Living Family/patient expects to be discharged to:: Private residence Living Arrangements: Children Type of Home: House Home Access: Stairs to enter Secretary/administrator of Steps: 0 Home Layout: One level Home Equipment: Environmental consultant - 2 wheels;Wheelchair - manual Additional Comments: uses RW in house; can't amb without it;  dtr works 3rd shift; pt reports she mostly sits all day, she amb only short distances in house Prior Function Level of Independence: Independent with assistive device(s) Communication Communication: No difficulties    Cognition  Cognition Arousal/Alertness: Awake/alert Behavior During Therapy: WFL for tasks assessed/performed Overall Cognitive Status: Within Functional Limits for tasks assessed    Extremity/Trunk Assessment Upper Extremity Assessment Upper Extremity Assessment: Generalized weakness Lower Extremity Assessment Lower Extremity Assessment: RLE deficits/detail;LLE deficits/detail RLE Deficits / Details:  pt with c/o bil knee pain, left greater than right; AROM  functional within limits of soft tissue approximation; she is able to move legs against gravity and bear wt although painful;  not tested further due to pending Ddimer/questionable dopplers RLE: Unable to fully assess due to pain   Balance Balance Standing balance comment: pt only able tostand for ~15sec due tobil knee pain; able to maintain static stand with RW support, close supervision  End of Session PT - End of Session Equipment Utilized During Treatment: Gait belt Activity Tolerance: Patient limited by pain;Patient limited by fatigue Patient left: in chair;with call bell/phone within reach  GP     [facility] 06/06/2013, 1:07 PM

## 2013-06-07 ENCOUNTER — Encounter (HOSPITAL_COMMUNITY): Payer: Self-pay | Admitting: Anesthesiology

## 2013-06-07 DIAGNOSIS — I82409 Acute embolism and thrombosis of unspecified deep veins of unspecified lower extremity: Secondary | ICD-10-CM

## 2013-06-07 LAB — BASIC METABOLIC PANEL
BUN: 55 mg/dL — AB (ref 6–23)
CALCIUM: 9.3 mg/dL (ref 8.4–10.5)
CO2: 32 mEq/L (ref 19–32)
CREATININE: 2.28 mg/dL — AB (ref 0.50–1.10)
Chloride: 88 mEq/L — ABNORMAL LOW (ref 96–112)
GFR, EST AFRICAN AMERICAN: 27 mL/min — AB (ref >90)
GFR, EST NON AFRICAN AMERICAN: 23 mL/min — AB (ref >90)
Glucose, Bld: 169 mg/dL — ABNORMAL HIGH (ref 70–99)
POTASSIUM: 3.2 meq/L — AB (ref 3.7–5.3)
Sodium: 134 mEq/L — ABNORMAL LOW (ref 137–147)

## 2013-06-07 MED ORDER — RIVAROXABAN 15 MG PO TABS: 15.0000 mg | ORAL_TABLET | Freq: Two times a day (BID) | ORAL | Status: DC

## 2013-06-07 MED ORDER — TORSEMIDE 100 MG PO TABS: 100.0000 mg | ORAL_TABLET | Freq: Two times a day (BID) | ORAL | Status: DC

## 2013-06-07 MED ORDER — POTASSIUM CHLORIDE CRYS ER 20 MEQ PO TBCR
40.0000 meq | EXTENDED_RELEASE_TABLET | Freq: Once | ORAL | Status: AC
  Administered 2013-06-07: 40 meq via ORAL

## 2013-06-07 MED ORDER — RIVAROXABAN (XARELTO) VTE STARTER PACK (15 & 20 MG): ORAL_TABLET | ORAL | Status: DC

## 2013-06-07 MED ORDER — APIXABAN 5 MG PO TABS: 5.0000 mg | ORAL_TABLET | Freq: Two times a day (BID) | ORAL | Status: DC

## 2013-06-07 MED ORDER — RIVAROXABAN (XARELTO) VTE STARTER PACK (15 & 20 MG)
ORAL_TABLET | ORAL | Status: DC
Start: 2013-06-07 — End: 2013-06-07

## 2013-06-07 MED ORDER — RIVAROXABAN 20 MG PO TABS: ORAL_TABLET | ORAL | Status: DC

## 2013-06-07 MED ORDER — ENOXAPARIN SODIUM 80 MG/0.8ML ~~LOC~~ SOLN
80.0000 mg | SUBCUTANEOUS | Status: DC
  Filled 2013-06-07: qty 0.8

## 2013-06-07 MED ORDER — APIXABAN 5 MG PO TABS
5.0000 mg | ORAL_TABLET | Freq: Two times a day (BID) | ORAL | Status: DC
  Administered 2013-06-07: 5 mg via ORAL
  Filled 2013-06-07 (×3): qty 1

## 2013-06-07 NOTE — Discharge Instructions (Signed)
Heart Failure °Heart failure is a condition in which the heart has trouble pumping blood. This means your heart does not pump blood efficiently for your body to work well. In some cases of heart failure, fluid may back up into your lungs or you may have swelling (edema) in your lower legs. Heart failure is usually a long-term (chronic) condition. It is important for you to take good care of yourself and follow your caregiver's treatment plan. °CAUSES  °Some health conditions can cause heart failure. Those health conditions include: °· High blood pressure (hypertension) causes the heart muscle to work harder than normal. When pressure in the blood vessels is high, the heart needs to pump (contract) with more force in order to circulate blood throughout the body. High blood pressure eventually causes the heart to become stiff and weak. °· Coronary artery disease (CAD) is the buildup of cholesterol and fat (plaque) in the arteries of the heart. The blockage in the arteries deprives the heart muscle of oxygen and blood. This can cause chest pain and may lead to a heart attack. High blood pressure can also contribute to CAD. °· Heart attack (myocardial infarction) occurs when 1 or more arteries in the heart become blocked. The loss of oxygen damages the muscle tissue of the heart. When this happens, part of the heart muscle dies. The injured tissue does not contract as well and weakens the heart's ability to pump blood. °· Abnormal heart valves can cause heart failure when the heart valves do not open and close properly. This makes the heart muscle pump harder to keep the blood flowing. °· Heart muscle disease (cardiomyopathy or myocarditis) is damage to the heart muscle from a variety of causes. These can include drug or alcohol abuse, infections, or unknown reasons. These can increase the risk of heart failure. °· Lung disease makes the heart work harder because the lungs do not work properly. This can cause a strain  on the heart, leading it to fail. °· Diabetes increases the risk of heart failure. High blood sugar contributes to high fat (lipid) levels in the blood. Diabetes can also cause slow damage to tiny blood vessels that carry important nutrients to the heart muscle. When the heart does not get enough oxygen and food, it can cause the heart to become weak and stiff. This leads to a heart that does not contract efficiently. °· Other conditions can contribute to heart failure. These include abnormal heart rhythms, thyroid problems, and low blood counts (anemia). °Certain unhealthy behaviors can increase the risk of heart failure. Those unhealthy behaviors include: °· Being overweight. °· Smoking or chewing tobacco. °· Eating foods high in fat and cholesterol. °· Abusing illicit drugs or alcohol. °· Lacking physical activity. °SYMPTOMS  °Heart failure symptoms may vary and can be hard to detect. Symptoms may include: °· Shortness of breath with activity, such as climbing stairs. °· Persistent cough. °· Swelling of the feet, ankles, legs, or abdomen. °· Unexplained weight gain. °· Difficulty breathing when lying flat (orthopnea). °· Waking from sleep because of the need to sit up and get more air. °· Rapid heartbeat. °· Fatigue and loss of energy. °· Feeling lightheaded, dizzy, or close to fainting. °· Loss of appetite. °· Nausea. °· Increased urination during the night (nocturia). °DIAGNOSIS  °A diagnosis of heart failure is based on your history, symptoms, physical examination, and diagnostic tests. °Diagnostic tests for heart failure may include: °· Echocardiography. °· Electrocardiography. °· Chest X-ray. °· Blood tests. °· Exercise   stress test. °· Cardiac angiography. °· Radionuclide scans. °TREATMENT  °Treatment is aimed at managing the symptoms of heart failure. Medicines, behavioral changes, or surgical intervention may be necessary to treat heart failure. °· Medicines to help treat heart failure may  include: °· Angiotensin-converting enzyme (ACE) inhibitors. This type of medicine blocks the effects of a blood protein called angiotensin-converting enzyme. ACE inhibitors relax (dilate) the blood vessels and help lower blood pressure. °· Angiotensin receptor blockers. This type of medicine blocks the actions of a blood protein called angiotensin. Angiotensin receptor blockers dilate the blood vessels and help lower blood pressure. °· Water pills (diuretics). Diuretics cause the kidneys to remove salt and water from the blood. The extra fluid is removed through urination. This loss of extra fluid lowers the volume of blood the heart pumps. °· Beta blockers. These prevent the heart from beating too fast and improve heart muscle strength. °· Digitalis. This increases the force of the heartbeat. °· Healthy behavior changes include: °· Obtaining and maintaining a healthy weight. °· Stopping smoking or chewing tobacco. °· Eating heart healthy foods. °· Limiting or avoiding alcohol. °· Stopping illicit drug use. °· Physical activity as directed by your caregiver. °· Surgical treatment for heart failure may include: °· A procedure to open blocked arteries, repair damaged heart valves, or remove damaged heart muscle tissue. °· A pacemaker to improve heart muscle function and control certain abnormal heart rhythms. °· An internal cardioverter defibrillator to treat certain serious abnormal heart rhythms. °· A left ventricular assist device to assist the pumping ability of the heart. °HOME CARE INSTRUCTIONS  °· Take your medicine as directed by your caregiver. Medicines are important in reducing the workload of your heart, slowing the progression of heart failure, and improving your symptoms. °· Do not stop taking your medicine unless directed by your caregiver. °· Do not skip any dose of medicine. °· Refill your prescriptions before you run out of medicine. Your medicines are needed every day. °· Take over-the-counter  medicine only as directed by your caregiver or pharmacist. °· Engage in moderate physical activity if directed by your caregiver. Moderate physical activity can benefit some people. The elderly and people with severe heart failure should consult with a caregiver for physical activity recommendations. °· Eat heart healthy foods. Food choices should be free of trans fat and low in saturated fat, cholesterol, and salt (sodium). Healthy choices include fresh or frozen fruits and vegetables, fish, lean meats, legumes, fat-free or low-fat dairy products, and whole grain or high fiber foods. Talk to a dietitian to learn more about heart healthy foods. °· Limit sodium if directed by your caregiver. Sodium restriction may reduce symptoms of heart failure in some people. Talk to a dietitian to learn more about heart healthy seasonings. °· Use healthy cooking methods. Healthy cooking methods include roasting, grilling, broiling, baking, poaching, steaming, or stir-frying. Talk to a dietitian to learn more about healthy cooking methods. °· Limit fluids if directed by your caregiver. Fluid restriction may reduce symptoms of heart failure in some people. °· Weigh yourself every day. Daily weights are important in the early recognition of excess fluid. You should weigh yourself every morning after you urinate and before you eat breakfast. Wear the same amount of clothing each time you weigh yourself. Record your daily weight. Provide your caregiver with your weight record. °· Monitor and record your blood pressure if directed by your caregiver. °· Check your pulse if directed by your caregiver. °· Lose weight if directed   by your caregiver. Weight loss may reduce symptoms of heart failure in some people.  Stop smoking or chewing tobacco. Nicotine makes your heart work harder by causing your blood vessels to constrict. Do not use nicotine gum or patches before talking to your caregiver.  Schedule and attend follow-up visits as  directed by your caregiver. It is important to keep all your appointments.  Limit alcohol intake to no more than 1 drink per day for nonpregnant women and 2 drinks per day for men. Drinking more than that is harmful to your heart. Tell your caregiver if you drink alcohol several times a week. Talk with your caregiver about whether alcohol is safe for you. If your heart has already been damaged by alcohol or you have severe heart failure, drinking alcohol should be stopped completely.  Stop illicit drug use.  Stay up-to-date with immunizations. It is especially important to prevent respiratory infections through current pneumococcal and influenza immunizations.  Manage other health conditions such as hypertension, diabetes, thyroid disease, or abnormal heart rhythms as directed by your caregiver.  Learn to manage stress.  Plan rest periods when fatigued.  Learn strategies to manage high temperatures. If the weather is extremely hot:  Avoid vigorous physical activity.  Use air conditioning or fans or seek a cooler location.  Avoid caffeine and alcohol.  Wear loose-fitting, lightweight, and light-colored clothing.  Learn strategies to manage cold temperatures. If the weather is extremely cold:  Avoid vigorous physical activity.  Layer clothes.  Wear mittens or gloves, a hat, and a scarf when going outside.  Avoid alcohol.  Obtain ongoing education and support as needed.  Participate or seek rehabilitation as needed to maintain or improve independence and quality of life. SEEK MEDICAL CARE IF:   Your weight increases by 03 lb/1.4 kg in 1 day or 05 lb/2.3 kg in a week.  You have increasing shortness of breath that is unusual for you.  You are unable to participate in your usual physical activities.  You tire easily.  You cough more than normal, especially with physical activity.  You have any or more swelling in areas such as your hands, feet, ankles, or abdomen.  You  are unable to sleep because it is hard to breathe.  You feel like your heart is beating fast (palpitations).  You become dizzy or lightheaded upon standing up. SEEK IMMEDIATE MEDICAL CARE IF:   You have difficulty breathing.  There is a change in mental status such as decreased alertness or difficulty with concentration.  You have a pain or discomfort in your chest.  You have an episode of fainting (syncope). MAKE SURE YOU:   Understand these instructions.  Will watch your condition.  Will get help right away if you are not doing well or get worse. Document Released: 05/02/2005 Document Revised: 08/27/2012 Document Reviewed: 05/24/2012 [facility] Patient Information 2014 [place], [place].  Information on my medicine - XARELTO (rivaroxaban)  This medication education was reviewed with me or my healthcare representative as part of my discharge preparation.  The pharmacist that spoke with me during my hospital stay was:  Tad Moore, BCPS 06/07/2013 1:23 PM   WHY WAS XARELTO PRESCRIBED FOR YOU? Xarelto was prescribed to treat blood clots that may have been found in the veins of your legs (deep vein thrombosis) or in your lungs (pulmonary embolism) and to reduce the risk of them occurring again.  What do you need to know about Xarelto? The starting dose is one 15 mg  tablet taken TWICE daily with food for the FIRST 21 DAYS then on (enter date)  06/27/13  the dose is changed to one 20 mg tablet taken ONCE A DAY with your evening meal.  DO NOT stop taking Xarelto without talking to the health care provider who prescribed the medication.  Refill your prescription for 20 mg tablets before you run out.  After discharge, you should have regular check-up appointments with your healthcare provider that is prescribing your Xarelto.  In the future your dose may need to be changed if your kidney function changes by a significant amount.  What do you do if you miss a dose? If you  are taking Xarelto TWICE DAILY and you miss a dose, take it as soon as you remember. You may take two 15 mg tablets (total 30 mg) at the same time then resume your regularly scheduled 15 mg twice daily the next day.  If you are taking Xarelto ONCE DAILY and you miss a dose, take it as soon as you remember on the same day then continue your regularly scheduled once daily regimen the next day. Do not take two doses of Xarelto at the same time.   Important Safety Information Xarelto is a blood thinner medicine that can cause bleeding. You should call your healthcare provider right away if you experience any of the following:   Bleeding from an injury or your nose that does not stop.   Unusual colored urine (red or dark brown) or unusual colored stools (red or black).   Unusual bruising for unknown reasons.   A serious fall or if you hit your head (even if there is no bleeding).  Some medicines may interact with Xarelto and might increase your risk of bleeding while on Xarelto. To help avoid this, consult your healthcare provider or pharmacist prior to using any new prescription or non-prescription medications, including herbals, vitamins, non-steroidal anti-inflammatory drugs (NSAIDs) and supplements.  This website has more information on Xarelto: VisitDestination.com.br.

## 2013-06-07 NOTE — Discharge Summary (Signed)
Advanced Heart Failure Team  Discharge Summary   Patient ID: [name] MRN: [id number], DOB/AGE: Aug 11, 1960 53 y.o. Admit date: 06/04/2013 D/C date:     06/07/2013   Primary Discharge Diagnoses:  1) A/C diastolic HF, -EF 40-98%, RV dilated and HK (05/2013) - Diuresed with IV lasix 10 mg/hr and metolazone 5 mg BID, net negative -3L. Discharge weight 384lbs.  Secondary Discharge Diagnoses:  1) AKI on stage III  2) Morbid Obesity 3) H/O MV Repair - Stable ECHO 05/2013 4) Calf Pain 5) Dysuria  Hospital Course:  Ms [name] is a 53 year old with a history of systolic CHF thought to be secondary to severe MR now s/p MV repair, rheumatoid arthritis, schizophrenia, obesity, overdose of seroquel 2013, S/P St Jude ICD 2013, and PUD.  [name] is followed closely in the HF clinic and was seen in the clinic on 06/04/13 for a routine appointment. At her visit she complained of increased fatigue, DOE and weight gain despite receiving home IV lasix. She was admitted from clinic for volume overload and was started on a lasix gtt at 10 mg/hr. Her UOP was sluggish so metolazone was added. Her weight did not change much during her admission and her Cr bumped to 2.28 and diuretics were stopped. She will be started on her home diuretic regimen starting tomorrow. She diuresed a total of 2.9 liters. UA was completed d/t dysuria, but it was found to be nl. During her stay she also complained of bilateral calf pain and had a venous doppler performed which showed possible thrombosis right femoral, right popliteal and left femoral veins, however they were non-compressible. The test was not definitive and D dimer was mildly elevated, so it was felt with her high risk for DVT d/t very sedentary lifestyle that she should be treated for DVT. Multiple conversations were held and her insurance did not cover Eliquis, however did Xarelto so she will be started on Xarelto 15 mg BID for 21 days and then 20 mg daily. Will  stop ASA. The pharmacist provided education on this new medication and stressed the importance of picking up the medication when she went home today.   On day of discharge her VSS. She will have follow up in the HF clinic 06/17/13 with a BMET.    Discharge Weight Range: 384-388 lbs Discharge Vitals: Blood pressure 124/84, pulse 126, temperature 97.6 F (36.4 C), temperature source Oral, resp. rate 20, height 5\' 6"  (1.676 m), weight 384 lb 15.5 oz (174.62 kg), last menstrual period 05/05/2012, SpO2 98.00%.  Labs: Lab Results  Component Value Date   WBC 6.0 06/04/2013   HGB 11.0* 06/04/2013   HCT 35.5* 06/04/2013   MCV 79.4 06/04/2013   PLT 330 06/04/2013    Recent Labs Lab 06/04/13 2015  06/07/13 0525  NA 139  < > 134*  K 3.4*  < > 3.2*  CL 90*  < > 88*  CO2 35*  < > 32  BUN 45*  < > 55*  CREATININE 1.74*  < > 2.28*  CALCIUM 10.0  < > 9.3  PROT 8.7*  --   --   BILITOT 0.3  --   --   ALKPHOS 193*  --   --   ALT 30  --   --   AST 24  --   --   GLUCOSE 171*  < > 169*  < > = values in this interval not displayed. Lab Results  Component Value Date   CHOL  157 07/05/2010   HDL 39.40 07/05/2010   LDLCALC 105* 07/05/2010   TRIG 62.0 07/05/2010   BNP (last 3 results)  Recent Labs  09/15/12 1000 01/08/13 1642 06/04/13 2015  PROBNP 85.5 65.0 123.5    Diagnostic Studies/Procedures   No results found.  Discharge Medications     Medication List    STOP taking these medications       aspirin EC 81 MG tablet      TAKE these medications       amitriptyline 150 MG tablet  Commonly known as:  ELAVIL  Take 150 mg by mouth at bedtime.     carvedilol 12.5 MG tablet  Commonly known as:  COREG  Take 18.75 mg by mouth 2 (two) times daily with a meal.     docusate sodium 100 MG capsule  Commonly known as:  COLACE  Take 100 mg by mouth daily.     esomeprazole 20 MG capsule  Commonly known as:  NEXIUM  Take 1 capsule (20 mg total) by mouth 2 (two) times daily before a meal.      febuxostat 40 MG tablet  Commonly known as:  ULORIC  Take 80 mg by mouth.     ferrous sulfate 325 (65 FE) MG tablet  Take 1 tablet (325 mg total) by mouth 2 (two) times daily with a meal.     hydrALAZINE 50 MG tablet  Commonly known as:  APRESOLINE  Take 1 tablet (50 mg total) by mouth 3 (three) times daily.     HYDROcodone-acetaminophen 10-325 MG per tablet  Commonly known as:  NORCO  Take 1 tablet by mouth every 6 (six) hours as needed for severe pain.     isosorbide mononitrate 60 MG 24 hr tablet  Commonly known as:  IMDUR  Take 90 mg by mouth daily.     metolazone 5 MG tablet  Commonly known as:  ZAROXOLYN  Take 5 mg by mouth every Monday, Wednesday, and Friday. 30 minutes before torsemide     potassium chloride SA 20 MEQ tablet  Commonly known as:  K-DUR,KLOR-CON  Take 40 mEq by mouth 2 (two) times daily.     Rivaroxaban 20 MG Tabs tablet  Commonly known as:  XARELTO  Continue 20 mg daily after dose pack finishes.     Rivaroxaban 15 & 20 MG Tbpk  Commonly known as:  XARELTO STARTER PACK  Start with one 15mg  tablet by mouth twice a day with food. On Day 22, switch to one 20mg  tablet once a day with food.     spironolactone 25 MG tablet  Commonly known as:  ALDACTONE  Take 25 mg by mouth daily.     torsemide 100 MG tablet  Commonly known as:  DEMADEX  Take 1 tablet (100 mg total) by mouth 2 (two) times daily.  Start taking on:  06/08/2013        Disposition   The patient will be discharged in stable condition to home. Discharge Orders   Future Appointments Provider Department Dept Phone   06/17/2013 11:00 AM [facility] [phone]   06/24/2013 9:45 AM [phone], MD 08/22/2013 [facility] [phone]   07/11/2013 3:15 PM [phone], MD [facility] [phone]   Future Orders Complete By Expires   Beta Blocker already ordered  As directed     Contraindication to ACEI at discharge  As directed    Scheduling  Instructions:     CRI   Diet - low sodium heart healthy  As directed    Heart Failure patients record your daily weight using the same scale at the same time of day  As directed    Increase activity slowly  As directed    STOP any activity that causes chest pain, shortness of breath, dizziness, sweating, or exessive weakness  As directed      Follow-up Information   Follow up with Arvilla Meres, MD On 06/17/2013. (@ 11:00 am; [facility] code [id number])    Specialty:  Cardiology   Contact information:   [address] [place] [address] [phone]         Duration of Discharge Encounter: Greater than 35 minutes   Signed, [name]  06/07/2013, 2:05 PM

## 2013-06-07 NOTE — Progress Notes (Signed)
Patient ID: [name], female   DOB: [birth year], 53 y.o.   MRN: [id number]    SUBJECTIVE: Having calf pain and bilateral LE duplex showed possible thrombosis right femoral, right popliteal and left femoral veins, however they were non-compressible. D dimer elevated.  Weight not changed and not diuresing well.   Scheduled Meds: . amitriptyline  150 mg Oral QHS  . apixaban  5 mg Oral BID  . carvedilol  12.5 mg Oral BID WC  . docusate sodium  100 mg Oral Daily  . febuxostat  80 mg Oral Daily  . ferrous sulfate  325 mg Oral BID WC  . hydrALAZINE  50 mg Oral Q8H  . isosorbide mononitrate  90 mg Oral Daily  . pantoprazole  40 mg Oral Daily  . potassium chloride  40 mEq Oral Once  . sodium chloride  3 mL Intravenous Q12H  . spironolactone  25 mg Oral Daily   Continuous Infusions:  PRN Meds:.sodium chloride, acetaminophen, HYDROcodone-acetaminophen, ondansetron (ZOFRAN) IV, sodium chloride    Filed Vitals:   06/06/13 1513 06/06/13 1544 06/06/13 1940 06/07/13 0544  BP: 130/97  111/86 124/84  Pulse: 98  91 126  Temp:  97.5 F (36.4 C) 98.2 F (36.8 C) 97.6 F (36.4 C)  TempSrc:  Oral Oral Oral  Resp: 18  20 20   Height:      Weight:    384 lb 15.5 oz (174.62 kg)  SpO2: 99%  99% 98%    Intake/Output Summary (Last 24 hours) at 06/07/13 0809 Last data filed at 06/07/13 0700  Gross per 24 hour  Intake   1200 ml  Output   1225 ml  Net    -25 ml    LABS: Basic Metabolic Panel:  Recent Labs  06/09/13 2015  06/06/13 0343 06/07/13 0525  NA 139  < > 137 134*  K 3.4*  < > 5.0 3.2*  CL 90*  < > 90* 88*  CO2 35*  < > 31 32  GLUCOSE 171*  < > 156* 169*  BUN 45*  < > 48* 55*  CREATININE 1.74*  < > 1.84* 2.28*  CALCIUM 10.0  < > 9.9 9.3  MG 1.8  --   --   --   < > = values in this interval not displayed. Liver Function Tests:  Recent Labs  06/04/13 2015  AST 24  ALT 30  ALKPHOS 193*  BILITOT 0.3  PROT 8.7*  ALBUMIN 3.1*   No results found for this basename:  LIPASE, AMYLASE,  in the last 72 hours CBC:  Recent Labs  06/04/13 2015  WBC 6.0  NEUTROABS 4.1  HGB 11.0*  HCT 35.5*  MCV 79.4  PLT 330   Cardiac Enzymes: No results found for this basename: CKTOTAL, CKMB, CKMBINDEX, TROPONINI,  in the last 72 hours BNP: No components found with this basename: POCBNP,  D-Dimer:  Recent Labs  06/06/13 1223  DDIMER 3.41*   Hemoglobin A1C: No results found for this basename: HGBA1C,  in the last 72 hours Fasting Lipid Panel: No results found for this basename: CHOL, HDL, LDLCALC, TRIG, CHOLHDL, LDLDIRECT,  in the last 72 hours Thyroid Function Tests: No results found for this basename: TSH, T4TOTAL, FREET3, T3FREE, THYROIDAB,  in the last 72 hours Anemia Panel: No results found for this basename: VITAMINB12, FOLATE, FERRITIN, TIBC, IRON, RETICCTPCT,  in the last 72 hours  RADIOLOGY: No results found.  PHYSICAL EXAM General: NAD, obese Neck: JVP difficult to assess d/t body  habitus; no thyromegaly or thyroid nodule.  Lungs: Clear to auscultation bilaterally with normal respiratory effort. CV: Nondisplaced PMI.  Heart regular S1/S2, no S3/S4, no murmur.  2+ edema to knees bilaterally.   Abdomen: Soft, nontender, no hepatosplenomegaly, no distention.  Neurologic: Alert and oriented x 3.  Psych: Normal affect. Extremities: No clubbing or cyanosis.   TELEMETRY: Reviewed telemetry pt in NSR  ASSESSMENT AND PLAN: [name] is a 53 year old with a history of systolic CHF thought to be secondary to severe MR now s/p MV repair, rheumatoid arthritis, schizophrenia, obesity, overdose of seroquel 2013, S/P St Jude ICD 2013, and PUD. She was last admitted to [facility] 09/2012 and required milrinone along with lasix to diurese due to cardiorenal syndrome. She was admitted 1/21 with acute/chronic diastolic CHF.  Echo 1/21 showed improvement in EF to 50-55%.  1. Acute/chronic diastolic CHF: Very difficult to assess volume status d/t body habitus. I am not  convinced she is significantly volume overloaded at this point.  The admission was triggered more by calf pain which may be due to a DVT.  Creatinine has bumped up.  - Stop Lasix gtt and metolazone.  Resume home diuretics tomorrow.  2. CKD: Creatinine up, will stop diuretics today.  3. H/o MV repair: Stable on echo.  4. Leg pain: Tender calves with increased edema. Duplex not definitive but possible DVT.  D dimer was very elevated.  She is at high risk for DVT (very sedentary).  I think we should treat this, will start Eliquis and stop ASA.    5. Dysuria: UA ok 6. Disposition: Home today after Eliquis is started.  She will resume her home medication regimen that she was on before except will stop ASA and start Eliquis for DVT.   [name]  06/07/2013 8:09 AM

## 2013-06-08 NOTE — Progress Notes (Signed)
Case discussed with Dr. Sharda soon after the resident saw the patient.  We reviewed the resident's history and exam and pertinent patient test results.  I agree with the assessment, diagnosis, and plan of care documented in the resident's note. 

## 2013-06-10 ENCOUNTER — Other Ambulatory Visit: Payer: Self-pay | Admitting: *Deleted

## 2013-06-10 MED ORDER — FERROUS SULFATE 325 (65 FE) MG PO TABS: 325.0000 mg | ORAL_TABLET | Freq: Two times a day (BID) | ORAL | Status: DC

## 2013-06-14 ENCOUNTER — Telehealth (HOSPITAL_COMMUNITY): Payer: Self-pay | Admitting: *Deleted

## 2013-06-14 NOTE — Telephone Encounter (Signed)
Received call from [place], RN with [facility] pt's wt is up 7 lbs this week, pt was 382 lb on Tue, 387 Wed, she didn't wt on Thur and today she is up to 391 lb, she states she is taking Torsemide 100 mg BID and Metolazone mon, wed and fri.  Beth states she is a little more SOB and slightly more edema, she did take metolazone today, will have pt take extra 50 mg of Torsemide today and tomorrow as needed, [name] will see her on Mon

## 2013-06-17 ENCOUNTER — Telehealth: Payer: Self-pay | Admitting: *Deleted

## 2013-06-17 ENCOUNTER — Encounter (HOSPITAL_COMMUNITY): Payer: Self-pay

## 2013-06-17 ENCOUNTER — Other Ambulatory Visit: Payer: Self-pay | Admitting: Internal Medicine

## 2013-06-17 ENCOUNTER — Ambulatory Visit (HOSPITAL_COMMUNITY)
Admit: 2013-06-17 | Discharge: 2013-06-17 | Disposition: A | Discharge status: Home / Self Care | Payer: Medicare Other | Attending: Internal Medicine | Admitting: Internal Medicine

## 2013-06-17 VITALS — BP 108/52 | HR 95 | Wt 395.4 lb

## 2013-06-17 DIAGNOSIS — N289 Disorder of kidney and ureter, unspecified: Secondary | ICD-10-CM

## 2013-06-17 DIAGNOSIS — N183 Chronic kidney disease, stage 3 unspecified: Secondary | ICD-10-CM

## 2013-06-17 DIAGNOSIS — I5023 Acute on chronic systolic (congestive) heart failure: Secondary | ICD-10-CM

## 2013-06-17 DIAGNOSIS — N189 Chronic kidney disease, unspecified: Secondary | ICD-10-CM | POA: Insufficient documentation

## 2013-06-17 DIAGNOSIS — I509 Heart failure, unspecified: Secondary | ICD-10-CM | POA: Insufficient documentation

## 2013-06-17 DIAGNOSIS — M25562 Pain in left knee: Principal | ICD-10-CM

## 2013-06-17 DIAGNOSIS — I5022 Chronic systolic (congestive) heart failure: Secondary | ICD-10-CM

## 2013-06-17 DIAGNOSIS — M25561 Pain in right knee: Secondary | ICD-10-CM

## 2013-06-17 DIAGNOSIS — I059 Rheumatic mitral valve disease, unspecified: Secondary | ICD-10-CM

## 2013-06-17 DIAGNOSIS — R0602 Shortness of breath: Secondary | ICD-10-CM

## 2013-06-17 DIAGNOSIS — I5032 Chronic diastolic (congestive) heart failure: Secondary | ICD-10-CM | POA: Insufficient documentation

## 2013-06-17 DIAGNOSIS — I129 Hypertensive chronic kidney disease with stage 1 through stage 4 chronic kidney disease, or unspecified chronic kidney disease: Secondary | ICD-10-CM | POA: Insufficient documentation

## 2013-06-17 DIAGNOSIS — I5033 Acute on chronic diastolic (congestive) heart failure: Secondary | ICD-10-CM

## 2013-06-17 LAB — BASIC METABOLIC PANEL
BUN: 30 mg/dL — ABNORMAL HIGH (ref 6–23)
CALCIUM: 9.3 mg/dL (ref 8.4–10.5)
CO2: 30 mEq/L (ref 19–32)
Chloride: 97 mEq/L (ref 96–112)
Creatinine, Ser: 1.4 mg/dL — ABNORMAL HIGH (ref 0.50–1.10)
GFR, EST AFRICAN AMERICAN: 49 mL/min — AB (ref >90)
GFR, EST NON AFRICAN AMERICAN: 42 mL/min — AB (ref >90)
Glucose, Bld: 141 mg/dL — ABNORMAL HIGH (ref 70–99)
Potassium: 3.8 mEq/L (ref 3.7–5.3)
Sodium: 141 mEq/L (ref 137–147)

## 2013-06-17 LAB — PRO B NATRIURETIC PEPTIDE: PRO B NATRI PEPTIDE: 454.6 pg/mL — AB (ref 0–125)

## 2013-06-17 MED ORDER — METOLAZONE 5 MG PO TABS: 5.0000 mg | ORAL_TABLET | ORAL | Status: DC

## 2013-06-17 MED ORDER — DICLOFENAC SODIUM 1 % TD GEL: 4.0000 g | Freq: Four times a day (QID) | TRANSDERMAL | Status: DC

## 2013-06-17 NOTE — Patient Instructions (Addendum)
Your physician recommends that you schedule a follow-up appointment in: 1 month  Repeat labs in 2 weeks (BMET,BNP)  Increase Metolazone to 4 times per week (Mon, Wed, Fri, and Sat)  Zarelto will increase to 20mg  daily on day 22 (around 06/29/13)  You have been referred to [facility], will call you with update on appointment

## 2013-06-17 NOTE — Telephone Encounter (Signed)
Call from pt - states she is in a lot of pain from both knees and she has been doubling her Vicodin.  Wants to know if she can get an early refill? Has only 1 tab left for today. Thanks

## 2013-06-17 NOTE — Telephone Encounter (Signed)
Her request for additional pills is refused at this time.  She was given a new script with decreased time between dosing to every 6 hours on 05/21/13.  In addition, she was in the hospital for a period of time thus not requiring her home meds and should have more than enough to last her until refill date of 07/02/13. It is dangerous for her to "double up" on 10/325 mg of Norco.  She has failed steroid injections to the knees. The alternative is evaluation by the pain clinic.  In the mean time.  I have sent in voltaren gel to be applied to her knees for pain.

## 2013-06-17 NOTE — Telephone Encounter (Signed)
Pt was called and informed of Dr Marge Duncans response of early refill refusal, not to double up on her med and rx for Voltaren gel has been ordered. Pt stated her next refill should be on the 12th not the 17th.

## 2013-06-17 NOTE — Progress Notes (Signed)
Patient ID: [name], female   DOB: 03/07/1961, 53 y.o.   MRN: [id number] PCP: Dr. Bosie Clos  53 yo with CHF probably secondary to severe MR now s/p MV repair, rheumatoid arthritis, schizophrenia, overdose of seroquel 2013, S/P St Jude ICD 2013, and PUD.  EF initially low, but most recent echo in 1/15 showed EF 50-55%, stable MV repair, RV dilated and hypokinetic but difficult images.   She returns for follow up. She was admitted in 1/15 with acute on chronic diastolic CHF and possible DVT (venous dopplers difficult but abnormal, and D dimer high).  She was started on Xarelto.  She was diuresed and discharged.  Today, weight is still up compared to her last appointment.  Symptomatically, she is stable.  Main complaint is knee pain.  She is walking with her walker in the house without dyspnea.  She does not go much farther.  No chest pain.  She has not been using her CPAP very often.   Labs (11/14): K 4.1 Creatinine 1.96  Labs (1/15): K 3.2, creatinine 2.28  Allergies:  1) ! * Colchicine  2) ! Morphine   Past History:  1. Congestive heart failure, most likely secondary to severe mitral regurgitation. TEE (6/10): EF 40%, diffuse hypokinesis, mild to moderately dilated LV, severe eccentric MR, small PFO. Cardiac MRI (6/10): EF 37% with global hypokinesis, moderate to severe MR, no delayed enhancement (no evidence for sarcoidosis or other infiltrative disease). TTE (10/10) after MV repair: EF 25-30%, moderately dilated LV, moderate diastolic dysfunction, mildly depressed RV function, trivial MR, MV mean gradient 5 mmHg, PASP 30 mmHg. RHC (12/10): mean RA 19, PA 46/26, mean PCWP 28, CI 2.5, SVO2 63%. TTE (2/11): EF 35-40% with diffuse hypokinesis, no significant MR or MS. TTE (7/11): EF 45%, mild global hypokinesis, s/p MV repair, no mitral regurgitation, minimal mitral stenosis, PA systolic pressure 40 mmHg, mild LV dilation.  TTE (11/12): EF 30-35%, mild LV dilation, mild LVH, s/p MV repair with  no regurgitation and minimal stenosis.  TTE (3/13): EF 25-30%, diffuse hypokinesis, no significant mitral stenosis by pressure halftime with mean gradient 7 mmHg across valve, no MR.  Echo (6/13): EF 30-35%, mildly dilated LV, mild mitral stenosis but no MR.  St Jude ICD placed in 8/13. RHC (5/14): mean RA 10, PA 23/10, mean PCWP 17, CI 2.17.  Echo (11/14) with EF 40%, stable repaired MV, mild to moderately dilated RV with mildly decreased systolic function.  Echo (1/15) with EF 50-55%, s/p MV repair with no MS or significant MR, RV poorly visualized but appears hypokinetic and dilated.  2. Severe mitral regurgitation (see above). Minimally invasive mitral valve repair on 12/18/08. She additionally had TV repair and PFO closure.  3. Microcytic anemia: EGD and colonoscopy in 12/11 did not reveal source of bleeding.  4. H/o gastric ulcers.  5. Morbid obesity.  6. Obstructive sleep apnea, on CPAP.  7. GERD.  8. Hypertension, controlled.  9. Rheumatoid arthritis  10. Gout.  11. Depression.  12. LHC (6/10): No angiographic CAD. Mildly dilated LV. 3+ MR. EF 40%.  13. Prior smoking, quit  14. Colitis (2/11)  15. Schizophrenia versus schizoaffective disorder 16. H/o DVT: On Xarelto 17. Syncope while coughing.   Family History:  Negative for cardiac or renal disease.  No FH of Colon Cancer  Social History:  Single, 5 children. Unemployed.  Tobacco Use - Yes. Smoked < 1 ppd, quit 6/10.  Alcohol Use - no  Regular Exercise - no  Drug Use -  no, hx crack-cocaine use  Daily Caffeine Use: 2 daily   Review of Systems  All systems reviewed and negative except as per HPI.  Current Outpatient Prescriptions  Medication Sig Dispense Refill  . amitriptyline (ELAVIL) 150 MG tablet Take 150 mg by mouth at bedtime.      . carvedilol (COREG) 12.5 MG tablet Take 18.75 mg by mouth 2 (two) times daily with a meal.      . docusate sodium (COLACE) 100 MG capsule Take 100 mg by mouth daily.      Marland Kitchen esomeprazole  (NEXIUM) 20 MG capsule Take 1 capsule (20 mg total) by mouth 2 (two) times daily before a meal.  180 capsule  3  . febuxostat (ULORIC) 40 MG tablet Take 80 mg by mouth.      . ferrous sulfate 325 (65 FE) MG tablet Take 1 tablet (325 mg total) by mouth 2 (two) times daily with a meal.  180 tablet  1  . hydrALAZINE (APRESOLINE) 50 MG tablet Take 1 tablet (50 mg total) by mouth 3 (three) times daily.  90 tablet  6  . HYDROcodone-acetaminophen (NORCO) 10-325 MG per tablet Take 1 tablet by mouth every 6 (six) hours as needed for severe pain.  120 tablet  0  . isosorbide mononitrate (IMDUR) 60 MG 24 hr tablet Take 90 mg by mouth daily.      . metolazone (ZAROXOLYN) 5 MG tablet Take 1 tablet (5 mg total) by mouth 4 (four) times a week. Monday,Wednesday,Friday and Saturday.  30 minutes before torsemide  20 tablet  3  . potassium chloride SA (K-DUR,KLOR-CON) 20 MEQ tablet Take 40 mEq by mouth 2 (two) times daily.      . Rivaroxaban (XARELTO STARTER PACK) 15 & 20 MG TBPK Start with one 15mg  tablet by mouth twice a day with food. On Day 22, switch to one 20mg  tablet once a day with food.  51 each  0  . Rivaroxaban (XARELTO) 20 MG TABS tablet Continue 20 mg daily after dose pack finishes.  30 tablet  6  . spironolactone (ALDACTONE) 25 MG tablet Take 25 mg by mouth daily.      torsemide (DEMADEX) 100 MG tablet Take 1 tablet (100 mg total) by mouth 2 (two) times daily.  60 tablet  6  . diclofenac sodium (VOLTAREN) 1 % GEL Apply 4 g topically 4 (four) times daily.  1 Tube  3  . [DISCONTINUED] QUEtiapine (SEROQUEL) 100 MG tablet Take 400 mg by mouth at bedtime.        No current facility-administered medications for this encounter.   Filed Vitals:   06/17/13 1137  BP: 108/52  Pulse: 95  Weight: 395 lb 6.4 oz (179.352 kg)  SpO2: 93%   General: NAD, obese. Sitting in WC Neck: Thick neck, JVP difficult to assess d/t body habitus , no thyromegaly or thyroid nodule.  Lungs: decreased in the bases CV:  Nondisplaced PMI.  Heart regular S1/S2, no S3/S4, no murmur.   No carotid bruit.  Normal pedal pulses.  Abdomen: Soft, markedly obese. Nontender, mild distention.  Neurologic: Alert and oriented x 3. Moves all 4 without difficulty Psych: Normal affect. Extremities: No clubbing or cyanosis. 1+ ankle edema.   Assessment/Plan 1. Chronic diastolic CHF: NICM with EF improved to 50-55%.  However, I suspect that she has significant RV failure. St Jude ICD.  NYHA class IIIb symptoms (chronic, stable).  She is in active.  Weight is up.   - Continue torsemide 100  mg bid and increase metolazone 2.5 mg to 4 times weekly prior to am torsemide.  She will get BMET/BNP today and in 2 wks.  - Continue current Coreg, imdur, hydralazine and spironolactone - She is not on Ace or Arb due to elevated creatinine  2. CKD: Will get BMET today.  Suspect there is a cardiorenal component.  3. Morbid obesity: Weight loss is imperative.  She will be seeing a surgeon on 2/4 regarding bariatric surgery.  4. DVT: Suspected DVT 11/14 admission.  She will continue Xarelto 15 mg bid until 2/22, then transition to 20 mg daily.  BMET today.  5. Mitral valve repair: Stable on 1/15 echo.   [name]  06/17/2013

## 2013-06-19 ENCOUNTER — Encounter (INDEPENDENT_AMBULATORY_CARE_PROVIDER_SITE_OTHER): Payer: Self-pay | Admitting: General Surgery

## 2013-06-19 ENCOUNTER — Ambulatory Visit (INDEPENDENT_AMBULATORY_CARE_PROVIDER_SITE_OTHER): Payer: Medicare Other | Admitting: General Surgery

## 2013-06-19 DIAGNOSIS — R5381 Other malaise: Secondary | ICD-10-CM

## 2013-06-19 DIAGNOSIS — R109 Unspecified abdominal pain: Secondary | ICD-10-CM

## 2013-06-19 DIAGNOSIS — K219 Gastro-esophageal reflux disease without esophagitis: Secondary | ICD-10-CM

## 2013-06-19 DIAGNOSIS — N189 Chronic kidney disease, unspecified: Secondary | ICD-10-CM

## 2013-06-19 DIAGNOSIS — R5383 Other fatigue: Secondary | ICD-10-CM

## 2013-06-19 NOTE — Patient Instructions (Signed)
Please watch EMMI video on weight loss surgery. If you don't have access to internet, please call office and we will let you view it in the office I would like for you to lose 30 pounds before we submit your packet to insurance I want you to see the nutritionist for advice/counseling We need to wait at least 3 months before surgery to let you recover from recent hospitalization

## 2013-06-24 ENCOUNTER — Encounter: Payer: Self-pay | Admitting: Internal Medicine

## 2013-06-24 ENCOUNTER — Ambulatory Visit (INDEPENDENT_AMBULATORY_CARE_PROVIDER_SITE_OTHER): Payer: Medicare Other | Admitting: Internal Medicine

## 2013-06-24 VITALS — BP 134/86 | HR 97 | Temp 97.2°F | Ht 66.0 in | Wt 390.7 lb

## 2013-06-24 DIAGNOSIS — Z95810 Presence of automatic (implantable) cardiac defibrillator: Secondary | ICD-10-CM

## 2013-06-24 DIAGNOSIS — I5022 Chronic systolic (congestive) heart failure: Secondary | ICD-10-CM

## 2013-06-24 DIAGNOSIS — K5900 Constipation, unspecified: Secondary | ICD-10-CM

## 2013-06-24 DIAGNOSIS — I82403 Acute embolism and thrombosis of unspecified deep veins of lower extremity, bilateral: Secondary | ICD-10-CM

## 2013-06-24 DIAGNOSIS — M1710 Unilateral primary osteoarthritis, unspecified knee: Secondary | ICD-10-CM

## 2013-06-24 DIAGNOSIS — I509 Heart failure, unspecified: Secondary | ICD-10-CM

## 2013-06-24 DIAGNOSIS — M25562 Pain in left knee: Secondary | ICD-10-CM

## 2013-06-24 DIAGNOSIS — I82409 Acute embolism and thrombosis of unspecified deep veins of unspecified lower extremity: Secondary | ICD-10-CM

## 2013-06-24 DIAGNOSIS — M25569 Pain in unspecified knee: Secondary | ICD-10-CM

## 2013-06-24 DIAGNOSIS — N3941 Urge incontinence: Secondary | ICD-10-CM

## 2013-06-24 DIAGNOSIS — R269 Unspecified abnormalities of gait and mobility: Secondary | ICD-10-CM

## 2013-06-24 DIAGNOSIS — M25561 Pain in right knee: Secondary | ICD-10-CM

## 2013-06-24 DIAGNOSIS — I10 Essential (primary) hypertension: Secondary | ICD-10-CM

## 2013-06-24 MED ORDER — HYDROCODONE-ACETAMINOPHEN 10-325 MG PO TABS: 1.0000 | ORAL_TABLET | Freq: Four times a day (QID) | ORAL | Status: DC | PRN

## 2013-06-24 NOTE — Patient Instructions (Signed)
General Instructions: You are doing well today. Keep your appointments as scheduled and follow the Bariatric Surgeon recommendations for the Educational video and Nutritionist. Keep your appointments with Cardiology. We have prescribed Miralax for your constipation. Continue the Xarelto as instructed. We have refilled your pain medications today.  Treatment Goals:  Goals (1 Years of Data) as of 06/24/13         As of Today 06/19/13 06/17/13 06/07/13 06/06/13     Blood Pressure    . Blood Pressure < 140/90  134/86 138/84 108/52 124/84 111/86     Lifestyle    . Prevent Falls            Progress Toward Treatment Goals:  Treatment Goal 06/24/2013  Blood pressure at goal    Self Care Goals & Plans:  Self Care Goal 06/24/2013  Manage my medications take my medicines as prescribed; bring my medications to every visit; refill my medications on time; follow the sick day instructions if I am sick  Monitor my health keep track of my weight  Eat healthy foods eat more vegetables; eat fruit for snacks and desserts; eat baked foods instead of fried foods; eat smaller portions  Be physically active find an activity I enjoy  Meeting treatment goals -    No flowsheet data found.   Care Management & Community Referrals:  Referral 01/18/2013  Referrals made for care management support none needed  Referrals made to community resources none

## 2013-06-24 NOTE — Assessment & Plan Note (Signed)
Using wheelchair secondary to bilateral knee pain

## 2013-06-24 NOTE — Progress Notes (Signed)
   Subjective:    Patient ID: [name], female    DOB: [birth year], 53 y.o.   MRN: [id number]  HPI  Presents today for hospital f/u for acute on chronic CHF and bilateral femoral DVTs.  She is now on Xarelto. Hx is significant for severe mitral valve regurgitation,  Nonischemic cardiomyopathy, hypertension, and schizoaffective disorder. She is morbidly obese with weight loss hindrance secondary to bilateral knee damage including prior bucket tears. She has been referred for bariatric surgery and was recently evulauated with recommendation for 30 lb weight loss prior to surgery.  Today she states that she has been having continued calf pain and severe knee pain as she has run out of her hydrocodone because she had to "double up" since hospital discharge.  Denies chest pain or shortness or breath today.    Review of Systems  Constitutional: Negative.   HENT: Negative.   Eyes: Negative.   Respiratory: Negative for chest tightness and shortness of breath.   Cardiovascular: Positive for leg swelling. Negative for chest pain and palpitations.  Gastrointestinal: Positive for constipation. Negative for nausea, vomiting, diarrhea and blood in stool.  Endocrine: Negative.   Genitourinary: Negative for dysuria.  Musculoskeletal: Positive for gait problem.       Bilateral knee and calf pain  Skin:       Weeping legs  Neurological: Negative.   Hematological: Does not bruise/bleed easily.  Psychiatric/Behavioral: Negative.        Objective:   Physical Exam  Constitutional: She is oriented to person, place, and time. She appears well-developed and well-nourished. No distress.  In wheelchair, family present  HENT:  Head: Normocephalic and atraumatic.  Eyes: Conjunctivae and EOM are normal. Pupils are equal, round, and reactive to light.  Neck: Normal range of motion. Neck supple. No thyromegaly present.  Cardiovascular: Normal rate, regular rhythm and intact distal pulses.   Murmur  heard. Pulmonary/Chest: Effort normal and breath sounds normal.  Abdominal: Soft. Bowel sounds are normal. There is no tenderness.  obese  Musculoskeletal: She exhibits edema and tenderness.       Right lower leg: She exhibits tenderness and edema.       Left lower leg: She exhibits tenderness and edema.   2+ Pitting edema bilaterally, non-weeping  Neurological: She is alert and oriented to person, place, and time.  Skin: Skin is warm and dry.  No LE weeping of skin on exam today, several excoriations form pt scratching  Psychiatric: She has a normal mood and affect. Her behavior is normal. Judgment and thought content normal.          Assessment & Plan:  See problem based charting:

## 2013-06-24 NOTE — Assessment & Plan Note (Addendum)
Resolved exacerbation, followed in CHF clinic, evaluated by Dr. [name] 06/17/13 Will refer to Cardio-pulm clinic in prep for bariatric surgery per request of Surgeon Dr. Andrey Campanile

## 2013-06-24 NOTE — Assessment & Plan Note (Signed)
Followed by bariatric surgery with recommendation for 30 lb weight loss prior.

## 2013-06-24 NOTE — Assessment & Plan Note (Signed)
BP Readings from Last 3 Encounters:  06/24/13 134/86  06/19/13 138/84  06/17/13 108/52    Lab Results  Component Value Date   NA 141 06/17/2013   K 3.8 06/17/2013   CREATININE 1.40* 06/17/2013    Assessment: Blood pressure control: controlled Progress toward BP goal:  at goal Comments:   Plan: Medications:  continue current medications Educational resources provided: brochure;handout;video Self management tools provided:   Other plans: cont BB, IMDUR, torsemide and spironolactone per Cardiology

## 2013-06-24 NOTE — Assessment & Plan Note (Signed)
Cont hydrocodone 10-325 until pt able to get knee surgery.  Probable bariatric surgery in near future if insurance approval.

## 2013-06-25 ENCOUNTER — Telehealth: Payer: Self-pay | Admitting: *Deleted

## 2013-06-25 NOTE — Progress Notes (Signed)
Patient ID: [name], female   DOB: [birth year], 53 y.o.   MRN: [id number]  Chief Complaint  Patient presents with  . Bariatric Pre-op    Initial visit to discuss RNY    HPI [name] is a 53 y.o. female.   HPI 53 yo morbidly obese AAF referred by Dr Bosie Clos for evaluation for weight loss surgery. The patient states that she is specifically interested in the gastric bypass. She states that it appears that this procedure would benefit her most. She also has had several friends undergo a gastric bypass and had good results. She attended our seminar in November. She states that she has struggled with her weight her entire life. Most recently she has had some significant health issues due to her obesity. She has tried numerous things for sustained weight loss such as physician directed and supervised weight loss, along with the nutritionist supervised program, and following the Atkins diet-all without any long-term success. At this point because of her morbid obesity she spends most of her times being sedentary. She uses a wheelchair or a scooter to get around. She will walk with a walker in her house. Otherwise she does not walk independently. She was recently in the hospital from January 20 through the 23rd for a CHF exacerbation. She also had an equivocal lower extremity duplex study an elevated d-dimer which was concerning for a DVT and she was started empirically on xarelto.  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  She currently does not use her CPAP because she states she doesn't like the mask and she felt like she never got good rest while wearing her CPAP Past Medical History  Diagnosis Date  . Severe mitral regurgitation     a. Minimally invasive MV repair on 12/18/08. Additionally TV repair  and PFO closure.   . Chronic systolic CHF (congestive heart failure)     a. 12/2011 Echo: EF 30%, diff HK, mild LVH, Gr 1 DD, mildly dil LA. b. 2/2 NICM.  [name] Microcytic anemia     a. Small capsule endoscopy 2012 with gastric erosion and small colonic AVMs. b. EGD and colonoscopy before that in 12/11 did not reveal source of bleeding)  . Gastric ulcer   . NICM (nonischemic cardiomyopathy)     a.12/2011: s/p SJM [id number] single lead ICD, Ser #: [id number]  . Obstructive sleep apnea     on CPAP  . GERD (gastroesophageal reflux disease)   . HTN (hypertension)   . Gout   . Depression   . Colitis 2/11  . Chronic back pain   . Chronic knee pain   . DVT (deep venous thrombosis)     right leg  . Anxiety   . Rheumatoid arthritis(714.0)     "shoulders & knees"  . Sleep disturbance     07/14/11 "haven't slept in 10 months; since going to [facility]"  . Hx: UTI (urinary tract infection)   . Nocturia   . COPD (chronic obstructive pulmonary disease)   . Bronchitis   . PTSD (post-traumatic stress disorder)   . Schizophrenia   . Dysmenorrhea   . Morbid obesity   . Acute renal insufficiency     a. ?Cardiorenal 09/2012.  [name] Hyperglycemia   . Automatic implantable cardioverter-defibrillator in situ   . Chronic diastolic HF (heart failure)     a. ECHO (05/2013) EF 50-55%, RV dilated and [facility]  Past Surgical History  Procedure Laterality Date  . Cardiac valve replacement  12/2008    cardiac  . Cardiac catheterization  04/20/2009, 11/04/2008  . Right knee arthroscopy  06/11/2004  . Extraction of teeth  12/04/2008  . Right mini thoracotomy for mitral valve repair and closure of [place]  12/18/2008  . Cardiac defibrillator placement      Family History  Problem Relation Age of Onset  . Hypertension Sister   . Alcoholism Father     died in his 64's or 36's  . Other Mother     alive & well @ 70.    Social History History  Substance Use Topics  . Smoking status: Former Smoker -- 0.50 packs/day for 27 years    Types:  Cigarettes    Quit date: 12/18/2008  . Smokeless tobacco: Never Used  . Alcohol Use: No     Comment: "stopped drinking alcohol 12/2003; did drink ~ 6 shots/wk"    Allergies  Allergen Reactions  . Colchicine Itching and Other (See Comments)    Whelps.  . Morphine Itching  . Risperidone Other (See Comments)    Auditory hallucinations.  . Shrimp [Shellfish Allergy] Hives    Break out in whelps and itch.    Current Outpatient Prescriptions  Medication Sig Dispense Refill  . amitriptyline (ELAVIL) 150 MG tablet Take 150 mg by mouth at bedtime.      . carvedilol (COREG) 12.5 MG tablet Take 18.75 mg by mouth 2 (two) times daily with a meal.      . diclofenac sodium (VOLTAREN) 1 % GEL Apply 4 g topically 4 (four) times daily.  1 Tube  3  . docusate sodium (COLACE) 100 MG capsule Take 100 mg by mouth daily.      [name] esomeprazole (NEXIUM) 20 MG capsule Take 1 capsule (20 mg total) by mouth 2 (two) times daily before a meal.  180 capsule  3  . febuxostat (ULORIC) 40 MG tablet Take 80 mg by mouth.      . ferrous sulfate 325 (65 FE) MG tablet Take 1 tablet (325 mg total) by mouth 2 (two) times daily with a meal.  180 tablet  1  . hydrALAZINE (APRESOLINE) 50 MG tablet Take 1 tablet (50 mg total) by mouth 3 (three) times daily.  90 tablet  6  . isosorbide mononitrate (IMDUR) 60 MG 24 hr tablet Take 90 mg by mouth daily.      . metolazone (ZAROXOLYN) 5 MG tablet Take 1 tablet (5 mg total) by mouth 4 (four) times a week. Monday,Wednesday,Friday and Saturday.  30 minutes before torsemide  20 tablet  3  . potassium chloride SA (K-DUR,KLOR-CON) 20 MEQ tablet Take 40 mEq by mouth 2 (two) times daily.      . Rivaroxaban (XARELTO STARTER PACK) 15 & 20 MG TBPK Start with one 15mg  tablet by mouth twice a day with food. On Day 22, switch to one 20mg  tablet once a day with food.  51 each  0  . Rivaroxaban (XARELTO) 20 MG TABS tablet Continue 20 mg daily after dose pack finishes.  30 tablet  6  . spironolactone  (ALDACTONE) 25 MG tablet Take 25 mg by mouth daily.      torsemide (DEMADEX) 100 MG tablet Take 1 tablet (100 mg total) by mouth 2 (two) times daily.  60 tablet  6  . HYDROcodone-acetaminophen (NORCO) 10-325 MG per tablet Take 1 tablet by mouth every 6 (six) hours as needed for severe  pain.  120 tablet  0  . [DISCONTINUED] QUEtiapine (SEROQUEL) 100 MG tablet Take 400 mg by mouth at bedtime.        No current facility-administered medications for this visit.    Review of Systems Review of Systems  Constitutional: Negative for fever, activity change, appetite change and unexpected weight change.  HENT: Negative for nosebleeds and trouble swallowing.   Eyes: Negative for photophobia and visual disturbance.  Respiratory: Negative for chest tightness and shortness of breath.        +OSA; uses cpap occasionally, states she doesn't sleep well. Doesn't like mask.   Cardiovascular: Negative for chest pain and leg swelling.       Denies CP, SOB; sleeps on 3 pillows. Denies PND. Reports intermittent LE swelling. Has had MV repair. Denies MI/stroke  Genitourinary: Negative for dysuria and difficulty urinating.  Musculoskeletal: Negative for arthralgias.  Skin: Negative for pallor and rash.  Neurological: Negative for dizziness, seizures, facial asymmetry and numbness.       Denies TIA and amaurosis fugax   Hematological: Negative for adenopathy. Does not bruise/bleed easily.  Psychiatric/Behavioral: Negative for behavioral problems and agitation.       She states that she has not seen a psychiatrist. She denies having a diagnosis of schizophrenia or schizo affective disorder    Blood pressure 138/84, pulse 86, resp. rate 18, height 5\' 6"  (1.676 m), weight 395 lb (179.171 kg), last menstrual period 05/05/2012.  Physical Exam Physical Exam  Vitals reviewed. Constitutional: She is oriented to person, place, and time. She appears well-developed and well-nourished. No distress.  Super obese;  sitting in wheelchair; unable to get on exam table  HENT:  Head: Normocephalic and atraumatic.  Right Ear: External ear normal.  Left Ear: External ear normal.  Eyes: Conjunctivae are normal. No scleral icterus.  Neck: Normal range of motion. Neck supple. No tracheal deviation present. No thyromegaly present.  Cardiovascular: Normal rate and normal heart sounds.   Didn't appreciate any murmur  Pulmonary/Chest: Effort normal and breath sounds normal. No stridor. No respiratory distress. She has no wheezes.  Abdominal: Soft. She exhibits no distension. There is no tenderness. There is no rebound.  obese  Musculoskeletal: She exhibits edema (trace b/l LE). She exhibits no tenderness.  Wheelchair dependent mostly  Lymphadenopathy:    She has no cervical adenopathy.  Neurological: She is alert and oriented to person, place, and time. She exhibits normal muscle tone.  Skin: Skin is warm and dry. No rash noted. She is not diaphoretic. No erythema.  Thick woody skin b/l LE.   Psychiatric: She has a normal mood and affect. Her behavior is normal. Judgment and thought content normal.    Data Reviewed Dr 05/07/2012 office note 06/17/13 Hospital d/c summary Jan 2015 Echo Jan 2015 - EF 50-55%; no MS, no MR Lower extremity duplex  Assessment    Morbid obesity BMI 63.8 Suspected LE DVT on anticoagulation CHF OSA GERD HTN Degenerative Joint Disease b/l knees     Plan    The patient meets weight loss surgery criteria. I think the patient would be an candidate for Laparoscopic Roux-en-Y Gastric bypass however she is high risk due to her CHF, functional status, anticoagulation.  I think we would need to wait at least a minimum of 3 months before operating on her. She needs to recover from this hospitalization for CHF exacerbation. Moreover she will need cardiac and pulmonary consultation and clearance. She'll also need to be seen and evaluated by a psychologist  to make sure she is psychological  stable.   We discussed laparoscopic Roux-en-Y gastric bypass. We discussed the preoperative, operative and postoperative process. Using diagrams, I explained the surgery in detail including the performance of an EGD near the end of the surgery and an Upper GI swallow study on POD 1. We discussed the typical hospital course including a 2-3 day stay baring any complications.   The patient was given educational material. I quoted the patient that they can expect to lose 50-70% of their excess weight with the gastric bypass. We did discuss the possibility of weight regain several years after the procedure.  We discussed the risk and benefits of surgery including but not limited to anesthesia risk, bleeding, infection, anastomotic edema requiring a few additional days in the hospital, postop nausea, possible conversion to open procedure, blood clot formation, anastomotic leak, anastomotic stricture, ulcer formation, death, respiratory complications, intestinal blockage, internal hernia, gallstone formation, vitamin and nutritional deficiencies, hair loss, weight regain injury to surrounding structures, failure to lose weight and mood changes.  We discussed that before and after surgery that there would be an alteration in their diet. I explained that we have put them on a diet 2 weeks before surgery. I also explained that they would be on a liquid diet for 2 weeks after surgery. We discussed that they would have to avoid certain foods such as sugar after surgery. We discussed the importance of physical activity as well as compliance with our dietary and supplement recommendations and routine follow-up.  I explained to the patient that we will start our evaluation process which includes labs, Upper GI to evaluate stomach and swallowing anatomy, nutritionist consultation, psychiatrist consultation, EKG, CXR, abdominal ultrasound, Cardiac consultation, pulmonary consultation.  I explained that I wanted her to see  a pulmonologist to further address her obstructive sleep apnea and CPAP settings. We discussed the importance of adequate sleep. It should be noted that the patient fell asleep during the counseling portion of the office visit. Therefore I like to bring her back in about 4-6 weeks. The patient and her family member were given access to watch the EMMI video on gastric bypass.  I also explained to the patient and her family member that I wanted her to try to lose 30 pounds before I would operate on her. Then going to see if we can get her into cardiac rehabilitation so that she can start working on her exercise tolerance. Perhaps she could use something like the arm bicycle. I also would like her to see the nutritionist more than once to reinforce proper eating behaviors and techniques and food choices.  Mary Sella. Andrey Campanile, MD, FACS General, Bariatric, & Minimally Invasive Surgery [facility], [place]         [facility] M 06/25/2013, 9:21 AM

## 2013-06-25 NOTE — Telephone Encounter (Signed)
Pt calls and states she needs her miralax script sent to her pharmacy

## 2013-06-26 DIAGNOSIS — K5900 Constipation, unspecified: Secondary | ICD-10-CM | POA: Insufficient documentation

## 2013-06-26 MED ORDER — POLYETHYLENE GLYCOL 3350 17 G PO PACK: 17.0000 g | PACK | Freq: Every day | ORAL | Status: DC

## 2013-06-26 NOTE — Assessment & Plan Note (Signed)
On chronic opioids, will add Miralax

## 2013-06-26 NOTE — Assessment & Plan Note (Signed)
Cont bilateral calf pain, managed with hydrocodone. Refill today, cont Xarelto

## 2013-06-26 NOTE — Progress Notes (Signed)
Case discussed with Dr. Schooler at the time of the visit.  We reviewed the resident's history and exam and pertinent patient test results.  I agree with the assessment, diagnosis, and plan of care documented in the resident's note.     

## 2013-06-26 NOTE — Assessment & Plan Note (Signed)
F/u Dr. Graciela Husbands 07/11/13

## 2013-06-27 NOTE — Telephone Encounter (Signed)
Pt had OV 06/24/13 with Dr Bosie Clos.

## 2013-06-28 ENCOUNTER — Telehealth: Payer: Self-pay | Admitting: *Deleted

## 2013-06-28 MED ORDER — OMEPRAZOLE 40 MG PO CPDR: 40.0000 mg | DELAYED_RELEASE_CAPSULE | Freq: Every day | ORAL | Status: DC

## 2013-06-28 NOTE — Telephone Encounter (Signed)
Spoke with attending, ok to switch patient to either PPI.  I contacted pt to see if she has a preferrence and she stated that she had tried protonix in the past and would like to give prilosec a try.  Will call in prilosec 40mg  once daily for now to see how she does. Pt instructed to call back if new PPI does not control her symptoms. Cassady2/13/20154:44 PM    Rx sent to pharmacy, pt aware. Will send to opc attending for signature.06/30/2013 Cassady2/13/20154:49 PM

## 2013-06-28 NOTE — Telephone Encounter (Signed)
Received faxed PA from pt's pharmacy ([facility] [phone])  for her nexium 20mg  caps take one cap twice daily. contacted pt's insurance at [phone] , both pantoprazole 40mg  and omeprazole 40 mg are preferred with BID dosing.  Will forward request to attending as pt needs something over the weekend.04-09-1995 Cassady2/13/20154:29 PM    Pt ID# [name]

## 2013-07-01 ENCOUNTER — Other Ambulatory Visit: Payer: Self-pay | Admitting: Cardiology

## 2013-07-01 ENCOUNTER — Encounter: Payer: Self-pay | Admitting: Cardiology

## 2013-07-01 ENCOUNTER — Telehealth: Payer: Self-pay | Admitting: Nurse Practitioner

## 2013-07-01 MED ORDER — CARVEDILOL 12.5 MG PO TABS: 18.7500 mg | ORAL_TABLET | Freq: Two times a day (BID) | ORAL | Status: DC

## 2013-07-01 NOTE — Telephone Encounter (Signed)
Phone call today -   Out of Coreg - needs refill - sent to [facility] at [facility] - #[phone] with 3 refills.  Rosalio Macadamia, RN, ANP-C [facility] [address] [place], [place]  [address] [phone]

## 2013-07-03 ENCOUNTER — Other Ambulatory Visit (HOSPITAL_COMMUNITY): Payer: Self-pay

## 2013-07-03 ENCOUNTER — Other Ambulatory Visit: Payer: Self-pay | Admitting: Cardiology

## 2013-07-03 MED ORDER — RIVAROXABAN 20 MG PO TABS: ORAL_TABLET | ORAL | Status: DC

## 2013-07-04 ENCOUNTER — Telehealth (HOSPITAL_COMMUNITY): Payer: Self-pay

## 2013-07-04 ENCOUNTER — Other Ambulatory Visit (HOSPITAL_COMMUNITY): Payer: Self-pay | Admitting: *Deleted

## 2013-07-04 ENCOUNTER — Other Ambulatory Visit (HOSPITAL_COMMUNITY): Payer: Self-pay

## 2013-07-04 MED ORDER — RIVAROXABAN 20 MG PO TABS: ORAL_TABLET | ORAL | Status: DC

## 2013-07-04 MED ORDER — TORSEMIDE 100 MG PO TABS: 100.0000 mg | ORAL_TABLET | Freq: Two times a day (BID) | ORAL | Status: DC

## 2013-07-04 MED ORDER — ISOSORBIDE MONONITRATE ER 60 MG PO TB24: ORAL_TABLET | ORAL | Status: DC

## 2013-07-04 NOTE — Telephone Encounter (Signed)
Patient called to make aware of refill of xarelto that was sent to preferred pharmacy. [name]

## 2013-07-09 ENCOUNTER — Encounter (HOSPITAL_COMMUNITY): Payer: Self-pay

## 2013-07-11 ENCOUNTER — Encounter: Payer: Medicare Other | Admitting: Internal Medicine

## 2013-07-15 ENCOUNTER — Telehealth: Payer: Self-pay | Admitting: Cardiology

## 2013-07-15 ENCOUNTER — Encounter (HOSPITAL_COMMUNITY): Payer: Self-pay

## 2013-07-15 ENCOUNTER — Ambulatory Visit (HOSPITAL_COMMUNITY)
Admission: RE | Admit: 2013-07-15 | Discharge: 2013-07-15 | Disposition: A | Discharge status: Home / Self Care | Payer: Medicare Other | Source: Ambulatory Visit | Attending: Internal Medicine | Admitting: Internal Medicine

## 2013-07-15 VITALS — BP 116/88 | HR 100 | Wt 390.0 lb

## 2013-07-15 DIAGNOSIS — I5032 Chronic diastolic (congestive) heart failure: Secondary | ICD-10-CM

## 2013-07-15 DIAGNOSIS — I059 Rheumatic mitral valve disease, unspecified: Secondary | ICD-10-CM | POA: Diagnosis not present

## 2013-07-15 DIAGNOSIS — I5033 Acute on chronic diastolic (congestive) heart failure: Secondary | ICD-10-CM | POA: Diagnosis not present

## 2013-07-15 DIAGNOSIS — N183 Chronic kidney disease, stage 3 unspecified: Secondary | ICD-10-CM | POA: Diagnosis not present

## 2013-07-15 DIAGNOSIS — I509 Heart failure, unspecified: Secondary | ICD-10-CM

## 2013-07-15 LAB — BASIC METABOLIC PANEL
BUN: 47 mg/dL — AB (ref 6–23)
CHLORIDE: 92 meq/L — AB (ref 96–112)
CO2: 34 mEq/L — ABNORMAL HIGH (ref 19–32)
CREATININE: 1.73 mg/dL — AB (ref 0.50–1.10)
Calcium: 9.3 mg/dL (ref 8.4–10.5)
GFR calc Af Amer: 38 mL/min — ABNORMAL LOW (ref >90)
GFR calc non Af Amer: 33 mL/min — ABNORMAL LOW (ref >90)
Glucose, Bld: 182 mg/dL — ABNORMAL HIGH (ref 70–99)
Potassium: 3.5 mEq/L — ABNORMAL LOW (ref 3.7–5.3)
Sodium: 138 mEq/L (ref 137–147)

## 2013-07-15 NOTE — Progress Notes (Signed)
Patient ID: [name], female   DOB: 03-07-1961, 53 y.o.   MRN: [id number] PCP: Dr. Bosie Clos  53 yo with CHF probably secondary to severe MR now s/p MV repair, rheumatoid arthritis, schizophrenia, overdose of seroquel 2013, S/P St Jude ICD 2013, and PUD.  EF initially low, but most recent echo in 1/15 showed EF 50-55%, stable MV repair, RV dilated and hypokinetic but difficult images.   She returns for follow up. She was admitted in 1/15 with acute on chronic diastolic CHF and possible DVT (venous dopplers difficult but abnormal, and D dimer high).  She was started on Xarelto.  She was diuresed and discharged.  Today, weight is down 5 lbs. Main complaint is knee pain.  She is walking with her walker in the house without dyspnea.  She does not go much farther.  No chest pain.  She has seen bariatric surgeon and is aiming towards gastric bypass at this point though she is going through the pre-operative workup currently.   Labs (11/14): K 4.1 Creatinine 1.96  Labs (1/15): K 3.2, creatinine 2.28 Labs (2/15): K 3.6, creatinine 2.05  Allergies:  1) ! * Colchicine  2) ! Morphine   Past History:  1. Congestive heart failure, most likely secondary to severe mitral regurgitation. TEE (6/10): EF 40%, diffuse hypokinesis, mild to moderately dilated LV, severe eccentric MR, small PFO. Cardiac MRI (6/10): EF 37% with global hypokinesis, moderate to severe MR, no delayed enhancement (no evidence for sarcoidosis or other infiltrative disease). TTE (10/10) after MV repair: EF 25-30%, moderately dilated LV, moderate diastolic dysfunction, mildly depressed RV function, trivial MR, MV mean gradient 5 mmHg, PASP 30 mmHg. RHC (12/10): mean RA 19, PA 46/26, mean PCWP 28, CI 2.5, SVO2 63%. TTE (2/11): EF 35-40% with diffuse hypokinesis, no significant MR or MS. TTE (7/11): EF 45%, mild global hypokinesis, s/p MV repair, no mitral regurgitation, minimal mitral stenosis, PA systolic pressure 40 mmHg, mild LV dilation.   TTE (11/12): EF 30-35%, mild LV dilation, mild LVH, s/p MV repair with no regurgitation and minimal stenosis.  TTE (3/13): EF 25-30%, diffuse hypokinesis, no significant mitral stenosis by pressure halftime with mean gradient 7 mmHg across valve, no MR.  Echo (6/13): EF 30-35%, mildly dilated LV, mild mitral stenosis but no MR.  St Jude ICD placed in 8/13. RHC (5/14): mean RA 10, PA 23/10, mean PCWP 17, CI 2.17.  Echo (11/14) with EF 40%, stable repaired MV, mild to moderately dilated RV with mildly decreased systolic function.  Echo (1/15) with EF 50-55%, s/p MV repair with no MS or significant MR, RV poorly visualized but appears hypokinetic and dilated.  2. Severe mitral regurgitation (see above). Minimally invasive mitral valve repair on 12/18/08. She additionally had TV repair and PFO closure.  3. Microcytic anemia: EGD and colonoscopy in 12/11 did not reveal source of bleeding.  4. H/o gastric ulcers.  5. Morbid obesity.  6. Obstructive sleep apnea, on CPAP.  7. GERD.  8. Hypertension, controlled.  9. Rheumatoid arthritis  10. Gout.  11. Depression.  12. LHC (6/10): No angiographic CAD. Mildly dilated LV. 3+ MR. EF 40%.  13. Prior smoking, quit  14. Colitis (2/11)  15. Schizophrenia versus schizoaffective disorder 16. H/o DVT: On Xarelto 17. Syncope while coughing.   Family History:  Negative for cardiac or renal disease.  No FH of Colon Cancer  Social History:  Single, 5 children. Unemployed.  Tobacco Use - Yes. Smoked < 1 ppd, quit 6/10.  Alcohol Use -  no  Regular Exercise - no  Drug Use - no, hx crack-cocaine use  Daily Caffeine Use: 2 daily   Review of Systems  All systems reviewed and negative except as per HPI.  Current Outpatient Prescriptions  Medication Sig Dispense Refill  . amitriptyline (ELAVIL) 150 MG tablet Take 150 mg by mouth at bedtime.      . carvedilol (COREG) 12.5 MG tablet Take 1.5 tablets (18.75 mg total) by mouth 2 (two) times daily with a meal.  90  tablet  3  . diclofenac sodium (VOLTAREN) 1 % GEL Apply 4 g topically 4 (four) times daily.  1 Tube  3  . docusate sodium (COLACE) 100 MG capsule Take 100 mg by mouth daily.      . febuxostat (ULORIC) 40 MG tablet Take 80 mg by mouth.      . ferrous sulfate 325 (65 FE) MG tablet Take 1 tablet (325 mg total) by mouth 2 (two) times daily with a meal.  180 tablet  1  . hydrALAZINE (APRESOLINE) 50 MG tablet Take 1 tablet (50 mg total) by mouth 3 (three) times daily.  90 tablet  6  . HYDROcodone-acetaminophen (NORCO) 10-325 MG per tablet Take 1 tablet by mouth every 6 (six) hours as needed for severe pain.  120 tablet  0  . isosorbide mononitrate (IMDUR) 60 MG 24 hr tablet TAKE ONE & ONE-HALF TABLETS BY MOUTH ONCE DAILY  45 tablet  6  . metolazone (ZAROXOLYN) 5 MG tablet Take 1 tablet (5 mg total) by mouth 4 (four) times a week. Monday,Wednesday,Friday and Saturday.  30 minutes before torsemide  20 tablet  3  . omeprazole (PRILOSEC) 40 MG capsule Take 1 capsule (40 mg total) by mouth daily.  30 capsule  3  . polyethylene glycol (MIRALAX / GLYCOLAX) packet Take 17 g by mouth daily.  14 each  0  . potassium chloride SA (K-DUR,KLOR-CON) 20 MEQ tablet Take 40 mEq by mouth 2 (two) times daily.      . Rivaroxaban (XARELTO) 20 MG TABS tablet Continue 20 mg daily after dose pack finishes.  30 tablet  6  . spironolactone (ALDACTONE) 25 MG tablet Take 25 mg by mouth daily.      Marland Kitchen torsemide (DEMADEX) 100 MG tablet Take 1 tablet (100 mg total) by mouth 2 (two) times daily.  180 tablet  3  . [DISCONTINUED] QUEtiapine (SEROQUEL) 100 MG tablet Take 400 mg by mouth at bedtime.        No current facility-administered medications for this encounter.   Filed Vitals:   07/15/13 1114  BP: 116/88  Pulse: 100  Weight: 390 lb (176.903 kg)  SpO2: 98%   General: NAD, obese. Sitting in WC Neck: Thick neck, JVP difficult to assess d/t body habitus but does not appear elevated, no thyromegaly or thyroid nodule.  Lungs:  decreased in the bases CV: Nondisplaced PMI.  Heart regular S1/S2, no S3/S4, no murmur.   No carotid bruit.  Normal pedal pulses.  Abdomen: Soft, markedly obese. Nontender, mild distention.  Neurologic: Alert and oriented x 3. Moves all 4 without difficulty Psych: Normal affect. Extremities: No clubbing or cyanosis. 1+ ankle edema.   Assessment/Plan 1. Chronic diastolic CHF: NICM with EF improved to 50-55%.  However, I suspect that she has significant RV failure. St Jude ICD.  NYHA class III symptoms (chronic, stable).  She is not very active due to obesity and knee pain.  Weight is down on current diuretic regimen and creatinine is  stable.   - Continue torsemide 100 mg bid and metolazone 2.5 mg to 4 times weekly prior to am torsemide.   - BMET today.  - Continue current Coreg, imdur, hydralazine and spironolactone - She is not on Ace or Arb due to elevated creatinine  2. CKD: Will get BMET today.  Suspect there is a cardiorenal component. Last creatinine was stable around 2.  3. Morbid obesity: Weight loss is imperative.  She saw Dr Andrey Campanile for bariatric surgery evaluation.  She is undergoing the pre-operative workup.  From a cardiac perspective, I think she is stable for surgery.  Would be reasonable to consider waiting 6 months after the possible DVT (was in 11/14).  4. DVT: Suspected DVT 11/14 admission.  She is on Xarelto, would probably continue long-term given her inactivity.  5. Mitral valve repair: Stable on 1/15 echo.   [name]  07/15/2013

## 2013-07-15 NOTE — Patient Instructions (Signed)
Lab today  We will contact you in 2 months to schedule your next appointment.  

## 2013-07-15 NOTE — Telephone Encounter (Signed)
New Problem:  [name] is calling to check on the status of interim orders.

## 2013-07-16 NOTE — Telephone Encounter (Signed)
Left message to call back  

## 2013-07-17 ENCOUNTER — Encounter: Payer: Self-pay | Admitting: Pulmonary Disease

## 2013-07-17 ENCOUNTER — Ambulatory Visit (INDEPENDENT_AMBULATORY_CARE_PROVIDER_SITE_OTHER): Payer: Medicare Other | Admitting: Pulmonary Disease

## 2013-07-17 VITALS — BP 124/68 | HR 79 | Temp 97.9°F | Ht 66.0 in | Wt 389.0 lb

## 2013-07-17 DIAGNOSIS — G4733 Obstructive sleep apnea (adult) (pediatric): Secondary | ICD-10-CM

## 2013-07-17 NOTE — Progress Notes (Signed)
Subjective:    Patient ID: [name], female    DOB: [birth year], 53 y.o.   MRN: [id number]  HPI The patient is a 53 year old female who I've been asked to see for management of obstructive sleep apnea. She was diagnosed with moderate OSA in 2007, with an AHI of 23 events per hour. She has been on CPAP since that time with a pressure of 11 cm. Most recently, she has had difficulties wearing the CPAP for various reasons. She has an older machine, and has not kept up with her mask changes. She also has very poor sleep hygiene, not going to bed until 2:00 in the morning. When the patient does not wear her CPAP, she has loud snoring, and she currently is not rested in the mornings upon arising. She has definite sleepiness during the day and in the evening with dozing. The patient's weight has increased over 100 pounds since her sleep study in 2007, but she states that her Epworth score today is only 1.   Sleep Questionnaire What time do you typically go to bed?( Between what hours) 2am 2am at 1551 on 07/17/13 by Maisie Fus, CMA How long does it take you to fall asleep?  at 1551 on 07/17/13 by Maisie Fus, CMA How many times during the night do you wake up? 2 2 at 1551 on 07/17/13 by Maisie Fus, CMA What time do you get out of bed to start your day? 0800 0800 at 1551 on 07/17/13 by Maisie Fus, CMA Do you drive or operate heavy machinery in your occupation? No No at 1551 on 07/17/13 by Maisie Fus, CMA How much has your weight changed (up or down) over the past two years? (In pounds) 100 lb (45.36 kg)100 lb (45.36 kg) increase at 1551 on 07/17/13 by Maisie Fus, CMA Have you ever had a sleep study before? Yes Yes at 1551 on 07/17/13 by Maisie Fus, CMA If yes, location of study[facility] at 1551 on 07/17/13 by Maisie Fus, CMA If yes, date of study? 2007 2007 at 1551 on 07/17/13 by Maisie Fus, CMA Do you currently use CPAP?  Yes Yes at 1551 on 07/17/13 by Maisie Fus, CMA If so, what pressure? 3? 3? at 1551 on 07/17/13 by Maisie Fus, CMA Do you wear oxygen at any time? No No at 1551 on 07/17/13 by Maisie Fus, CMA   Review of Systems  Constitutional: Negative for fever and unexpected weight change.  HENT: Negative for congestion, dental problem, ear pain, nosebleeds, postnasal drip, rhinorrhea, sinus pressure, sneezing, sore throat and trouble swallowing.   Eyes: Negative for redness and itching.  Respiratory: Positive for shortness of breath. Negative for cough, chest tightness and wheezing.   Cardiovascular: Negative for palpitations and leg swelling.  Gastrointestinal: Negative for nausea and vomiting.  Genitourinary: Negative for dysuria.  Musculoskeletal: Negative for joint swelling.  Skin: Negative for rash.  Neurological: Negative for headaches.  Hematological: Does not bruise/bleed easily.  Psychiatric/Behavioral: Negative for dysphoric mood. The patient is not nervous/anxious.        Objective:   Physical Exam Constitutional:  Morbidly obese female, no acute distress  HENT:  Nares patent without discharge, but enlarged turbinates and narrowing  Oropharynx without exudate, palate and uvula are mildly elongated.   Eyes:  Perrla, eomi, no scleral icterus  Neck:  No JVD, no TMG  Cardiovascular:  Normal rate, regular rhythm, no rubs or gallops.  No murmurs       Difficulty feeling pulses due to edema  Pulmonary :  Normal breath sounds, no stridor or respiratory distress   No rales, rhonchi, or wheezing  Abdominal:  Soft, nondistended, bowel sounds present.  No tenderness noted.   Musculoskeletal:  3+ lower extremity edema noted.  Lymph Nodes:  No cervical lymphadenopathy noted  Skin:  No cyanosis noted  Neurologic:  Alert, appropriate, moves all 4 extremities without obvious deficit.         Assessment & Plan:

## 2013-07-17 NOTE — Patient Instructions (Signed)
Will arrange for a new cpap machine, and will set on the auto setting so that it can self adjust. Work on weight loss followup with me again in 8 weeks.

## 2013-07-17 NOTE — Assessment & Plan Note (Signed)
The patient has a history of moderate obstructive sleep apnea, but has gained over 100 pounds since her sleep study and pressure titration. I suspect that her sleep apnea is much worse, and that she will need higher pressure levels for treatment. I have reviewed with her the pathophysiology of sleep apnea, including its impact to her known cardiomyopathy. She will need to have a new CPAP device, and will use the automatic setting for pressure optimization. I also stressed to her the importance of aggressive weight loss.

## 2013-07-18 ENCOUNTER — Other Ambulatory Visit: Payer: Self-pay | Admitting: Internal Medicine

## 2013-07-18 ENCOUNTER — Other Ambulatory Visit: Payer: Self-pay | Admitting: *Deleted

## 2013-07-18 DIAGNOSIS — M1710 Unilateral primary osteoarthritis, unspecified knee: Secondary | ICD-10-CM

## 2013-07-18 DIAGNOSIS — M25562 Pain in left knee: Principal | ICD-10-CM

## 2013-07-18 DIAGNOSIS — M25561 Pain in right knee: Secondary | ICD-10-CM

## 2013-07-18 MED ORDER — HYDROCODONE-ACETAMINOPHEN 10-325 MG PO TABS: 1.0000 | ORAL_TABLET | Freq: Four times a day (QID) | ORAL | Status: DC | PRN

## 2013-07-19 ENCOUNTER — Other Ambulatory Visit: Payer: Self-pay

## 2013-07-19 ENCOUNTER — Ambulatory Visit (HOSPITAL_COMMUNITY)
Admission: RE | Admit: 2013-07-19 | Discharge: 2013-07-19 | Disposition: A | Discharge status: Home / Self Care | Payer: Medicare Other | Source: Ambulatory Visit | Attending: General Surgery | Admitting: General Surgery

## 2013-07-19 ENCOUNTER — Other Ambulatory Visit (INDEPENDENT_AMBULATORY_CARE_PROVIDER_SITE_OTHER): Payer: Self-pay | Admitting: General Surgery

## 2013-07-19 DIAGNOSIS — K219 Gastro-esophageal reflux disease without esophagitis: Secondary | ICD-10-CM

## 2013-07-19 DIAGNOSIS — G4733 Obstructive sleep apnea (adult) (pediatric): Secondary | ICD-10-CM | POA: Insufficient documentation

## 2013-07-19 DIAGNOSIS — Z1231 Encounter for screening mammogram for malignant neoplasm of breast: Secondary | ICD-10-CM

## 2013-07-19 DIAGNOSIS — I509 Heart failure, unspecified: Secondary | ICD-10-CM | POA: Insufficient documentation

## 2013-07-19 DIAGNOSIS — R5383 Other fatigue: Secondary | ICD-10-CM

## 2013-07-19 DIAGNOSIS — R0602 Shortness of breath: Secondary | ICD-10-CM | POA: Insufficient documentation

## 2013-07-19 DIAGNOSIS — N189 Chronic kidney disease, unspecified: Secondary | ICD-10-CM | POA: Insufficient documentation

## 2013-07-19 DIAGNOSIS — I82409 Acute embolism and thrombosis of unspecified deep veins of unspecified lower extremity: Secondary | ICD-10-CM | POA: Insufficient documentation

## 2013-07-19 DIAGNOSIS — J984 Other disorders of lung: Secondary | ICD-10-CM | POA: Insufficient documentation

## 2013-07-19 DIAGNOSIS — I517 Cardiomegaly: Secondary | ICD-10-CM | POA: Insufficient documentation

## 2013-07-19 DIAGNOSIS — K8020 Calculus of gallbladder without cholecystitis without obstruction: Secondary | ICD-10-CM | POA: Insufficient documentation

## 2013-07-19 DIAGNOSIS — I129 Hypertensive chronic kidney disease with stage 1 through stage 4 chronic kidney disease, or unspecified chronic kidney disease: Secondary | ICD-10-CM | POA: Insufficient documentation

## 2013-07-19 DIAGNOSIS — R109 Unspecified abdominal pain: Secondary | ICD-10-CM

## 2013-07-19 DIAGNOSIS — M1990 Unspecified osteoarthritis, unspecified site: Secondary | ICD-10-CM | POA: Insufficient documentation

## 2013-07-19 DIAGNOSIS — Z6841 Body Mass Index (BMI) 40.0 and over, adult: Secondary | ICD-10-CM | POA: Insufficient documentation

## 2013-07-19 NOTE — Telephone Encounter (Signed)
Left message to call back  

## 2013-07-20 ENCOUNTER — Encounter: Payer: Self-pay | Admitting: Dietician

## 2013-07-20 ENCOUNTER — Encounter: Payer: Medicare Other | Attending: General Surgery | Admitting: Dietician

## 2013-07-20 DIAGNOSIS — D509 Iron deficiency anemia, unspecified: Secondary | ICD-10-CM | POA: Insufficient documentation

## 2013-07-20 DIAGNOSIS — I509 Heart failure, unspecified: Secondary | ICD-10-CM

## 2013-07-20 DIAGNOSIS — I10 Essential (primary) hypertension: Secondary | ICD-10-CM | POA: Diagnosis not present

## 2013-07-20 DIAGNOSIS — N183 Chronic kidney disease, stage 3 unspecified: Secondary | ICD-10-CM

## 2013-07-20 DIAGNOSIS — Z6841 Body Mass Index (BMI) 40.0 and over, adult: Secondary | ICD-10-CM | POA: Insufficient documentation

## 2013-07-20 DIAGNOSIS — I129 Hypertensive chronic kidney disease with stage 1 through stage 4 chronic kidney disease, or unspecified chronic kidney disease: Secondary | ICD-10-CM

## 2013-07-20 DIAGNOSIS — E119 Type 2 diabetes mellitus without complications: Secondary | ICD-10-CM | POA: Diagnosis not present

## 2013-07-20 DIAGNOSIS — Z713 Dietary counseling and surveillance: Secondary | ICD-10-CM | POA: Diagnosis not present

## 2013-07-20 DIAGNOSIS — I5033 Acute on chronic diastolic (congestive) heart failure: Secondary | ICD-10-CM

## 2013-07-20 NOTE — Patient Instructions (Signed)
Patient to call the [facility] to enroll in Pre-Op and Post-Op Nutrition Education when surgery date is scheduled. 

## 2013-07-20 NOTE — Progress Notes (Signed)
  Pre-Op Assessment Visit:  Pre-Operative Gastric sleeve Surgery  Medical Nutrition Therapy:  Appt start time: 1300   End time:  1345.  Patient was seen on 07/20/2013 for Pre-Operative Gastric sleeve Nutrition Assessment. Assessment and letter of approval faxed to [facility] Bariatric Surgery Program coordinator on 07/20/2013.   Preferred Learning Style:   No preference indicated   Learning Readiness:  Contemplating   Handouts given during visit include:  Pre-Op Goals Bariatric Surgery Protein Shakes  Teaching Method Utilized:  Visual Auditory Hands on  Barriers to learning/adherence to lifestyle change: food preferences  Demonstrated degree of understanding via:  Teach Back   Patient to call the [facility] to enroll in Pre-Op and Post-Op Nutrition Education when surgery date is scheduled.

## 2013-07-22 ENCOUNTER — Encounter: Payer: Self-pay | Admitting: Internal Medicine

## 2013-07-22 ENCOUNTER — Ambulatory Visit (INDEPENDENT_AMBULATORY_CARE_PROVIDER_SITE_OTHER): Payer: Medicare Other | Admitting: Internal Medicine

## 2013-07-22 VITALS — BP 101/62 | HR 89 | Ht 66.0 in

## 2013-07-22 DIAGNOSIS — I509 Heart failure, unspecified: Secondary | ICD-10-CM

## 2013-07-22 DIAGNOSIS — I428 Other cardiomyopathies: Secondary | ICD-10-CM

## 2013-07-22 DIAGNOSIS — I5032 Chronic diastolic (congestive) heart failure: Secondary | ICD-10-CM

## 2013-07-22 NOTE — Progress Notes (Signed)
[name]      Patient Care Team: Manuela Schwartz, MD as PCP - General (Internal Medicine)   HPI  [name] is a 53 y.o. 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 She also underwent tricuspid valve repair with a PFO closure     Echocardiogram 1/15 demonstrated significant interval improvement with EF 50-55%; the RV was dilated and hypokinetic. In the repair was stable.  She is also want to have a possible DVT and was started on Rivaroxaban   She is modest renal insufficiency  SHe suffers from morbid obesity and sleep apnea. He's being evaluated for obesity reduction surgery  SHE HURTS ALL THE TIME  Past Medical History  Diagnosis Date  . Severe mitral regurgitation     a. Minimally invasive MV repair on 12/18/08. Additionally TV repair  and PFO closure.  . Chronic systolic CHF (congestive heart failure)     a. 12/2011 Echo: EF 30%, diff HK, mild LVH, Gr 1 DD, mildly dil LA. b. 2/2 NICM.  Marland Kitchen Microcytic anemia     a. Small capsule endoscopy 2012 with gastric erosion and small colonic AVMs. b. EGD and colonoscopy before that in 12/11 did not reveal source of bleeding)  . Gastric ulcer   . NICM (nonischemic cardiomyopathy)     a.12/2011: s/p SJM [id number] single lead ICD, Ser #: [id number]  . Obstructive sleep apnea     on CPAP  . GERD (gastroesophageal reflux disease)   . HTN (hypertension)   . Gout   . Depression   . Colitis 2/11  . Chronic back pain   . Chronic knee pain   . DVT (deep venous thrombosis)     right leg  . Anxiety   . Rheumatoid arthritis(714.0)     "shoulders & knees"  . Sleep disturbance     07/14/11 "haven't slept in 10 months; since going to [facility]"  . Hx: UTI (urinary tract infection)   . Nocturia   . COPD (chronic  obstructive pulmonary disease)   . Bronchitis   . PTSD (post-traumatic stress disorder)   . Schizophrenia   . Dysmenorrhea   . Morbid obesity   . Acute renal insufficiency     a. ?Cardiorenal 09/2012.  Marland Kitchen Hyperglycemia   . Automatic implantable cardioverter-defibrillator in situ   . Chronic diastolic HF (heart failure)     a. ECHO (05/2013) EF 50-55%, RV dilated and [facility]    Past Surgical History  Procedure Laterality Date  . Cardiac valve replacement  12/2008    cardiac  . Cardiac catheterization  04/20/2009, 11/04/2008  . Right knee arthroscopy  06/11/2004  . Extraction of teeth  12/04/2008  . Right mini thoracotomy for mitral valve repair and closure of [place]  12/18/2008  . Cardiac defibrillator placement      Current Outpatient Prescriptions  Medication Sig Dispense Refill  . amitriptyline (ELAVIL) 150 MG tablet Take 150 mg by mouth at bedtime.      . carvedilol (COREG) 12.5 MG tablet Take 1.5 tablets (18.75 mg total) by mouth 2 (two) times daily with a meal.  90 tablet  3  . diclofenac sodium (VOLTAREN) 1 % GEL Apply 4 g topically 4 (four) times daily.  1 Tube  3  . docusate sodium (COLACE) 100 MG capsule Take 100 mg by mouth  daily.      . Febuxostat 80 MG TABS Take by mouth daily.      . ferrous sulfate 325 (65 FE) MG tablet Take 1 tablet (325 mg total) by mouth 2 (two) times daily with a meal.  180 tablet  1  . hydrALAZINE (APRESOLINE) 50 MG tablet Take 1 tablet (50 mg total) by mouth 3 (three) times daily.  90 tablet  6  . HYDROcodone-acetaminophen (NORCO) 10-325 MG per tablet Take 1 tablet by mouth every 6 (six) hours as needed for severe pain.  120 tablet  0  . isosorbide mononitrate (IMDUR) 60 MG 24 hr tablet TAKE ONE & ONE-HALF TABLETS BY MOUTH ONCE DAILY  45 tablet  6  . metolazone (ZAROXOLYN) 5 MG tablet Take 1 tablet (5 mg total) by mouth 4 (four) times a week. Monday,Wednesday,Friday and Saturday.  30 minutes before torsemide  20 tablet  3  . omeprazole (PRILOSEC) 40  MG capsule Take 1 capsule (40 mg total) by mouth daily.  30 capsule  3  . polyethylene glycol (MIRALAX / GLYCOLAX) packet MIX 17 G (ONE PACKET) IN LIQUID AND DRINK BY MOUTH DAILY  14 packet  0  . potassium chloride SA (K-DUR,KLOR-CON) 20 MEQ tablet Take 40 mEq by mouth 2 (two) times daily.      . Rivaroxaban (XARELTO) 20 MG TABS tablet Continue 20 mg daily after dose pack finishes.  30 tablet  6  . spironolactone (ALDACTONE) 25 MG tablet Take 25 mg by mouth daily.      Marland Kitchen torsemide (DEMADEX) 100 MG tablet Take 1 tablet (100 mg total) by mouth 2 (two) times daily.  180 tablet  3  . [DISCONTINUED] QUEtiapine (SEROQUEL) 100 MG tablet Take 400 mg by mouth at bedtime.        No current facility-administered medications for this visit.    Allergies  Allergen Reactions  . Colchicine Itching and Other (See Comments)    [place]helps.  . Morphine Itching  . Risperidone Other (See Comments)    Auditory hallucinations.  . Shrimp [Shellfish Allergy] Hives    Break out in whelps and itch.    Review of Systems negative except from HPI and PMH  Physical Exam BP 101/62  Pulse 89  Ht 5\' 6"  (1.676 m)  LMP 05/05/2012 Morbidly obese in no acute distress HENT normal Clear to ausculation  Regular rate and rhythm, no murmurs gallops or rub Soft with active bowel sounds No clubbing cyanosis 1+ Edema Alert and oriented, grossly normal motor and sensory function sititing in a wheel chair Skin Warm and Dry  ECG  NSR 89 17/09/37 Non specific T wave inversion  Assessment and  Plan  Nonischemic cardiomyopathy--intercurrent resolution  ICD--St Jude  The patient's device was interrogated.  The information was reviewed. No changes were made in the programming.

## 2013-07-22 NOTE — Patient Instructions (Signed)
Your physician recommends that you continue on your current medications as directed. Please refer to the Current Medication list given to you today.  Your physician wants you to follow-up in: 1 year with Dr. Klein.  You will receive a reminder letter in the mail two months in advance. If you don't receive a letter, please call our office to schedule the follow-up appointment.  

## 2013-07-22 NOTE — Telephone Encounter (Signed)
Spoke with [name], she states they had faxed orders to be signed and has never received them back, advised I haven't received them, she will refax, Dr Shirlee Latch will sign and we will fax back

## 2013-07-29 ENCOUNTER — Other Ambulatory Visit (INDEPENDENT_AMBULATORY_CARE_PROVIDER_SITE_OTHER): Payer: Self-pay | Admitting: General Surgery

## 2013-07-29 DIAGNOSIS — R928 Other abnormal and inconclusive findings on diagnostic imaging of breast: Secondary | ICD-10-CM

## 2013-08-01 ENCOUNTER — Other Ambulatory Visit (INDEPENDENT_AMBULATORY_CARE_PROVIDER_SITE_OTHER): Payer: Self-pay | Admitting: General Surgery

## 2013-08-01 ENCOUNTER — Other Ambulatory Visit: Payer: Medicare Other

## 2013-08-01 DIAGNOSIS — R928 Other abnormal and inconclusive findings on diagnostic imaging of breast: Secondary | ICD-10-CM

## 2013-08-02 ENCOUNTER — Ambulatory Visit
Admission: RE | Admit: 2013-08-02 | Discharge: 2013-08-02 | Disposition: A | Discharge status: Home / Self Care | Payer: Medicare Other | Source: Ambulatory Visit | Attending: General Surgery | Admitting: General Surgery

## 2013-08-02 ENCOUNTER — Ambulatory Visit
Admission: RE | Admit: 2013-08-02 | Discharge: 2013-08-02 | Disposition: A | Discharge status: Home / Self Care | Payer: Medicaid Other | Source: Ambulatory Visit | Attending: General Surgery | Admitting: General Surgery

## 2013-08-02 ENCOUNTER — Other Ambulatory Visit: Payer: Medicare Other

## 2013-08-02 ENCOUNTER — Other Ambulatory Visit (INDEPENDENT_AMBULATORY_CARE_PROVIDER_SITE_OTHER): Payer: Self-pay | Admitting: General Surgery

## 2013-08-02 DIAGNOSIS — R928 Other abnormal and inconclusive findings on diagnostic imaging of breast: Secondary | ICD-10-CM

## 2013-08-08 ENCOUNTER — Other Ambulatory Visit: Payer: Self-pay | Admitting: Internal Medicine

## 2013-08-08 DIAGNOSIS — R928 Other abnormal and inconclusive findings on diagnostic imaging of breast: Secondary | ICD-10-CM

## 2013-08-14 ENCOUNTER — Encounter: Payer: Medicare Other | Attending: General Surgery | Admitting: Dietician

## 2013-08-14 DIAGNOSIS — Z713 Dietary counseling and surveillance: Secondary | ICD-10-CM | POA: Insufficient documentation

## 2013-08-14 DIAGNOSIS — R7309 Other abnormal glucose: Secondary | ICD-10-CM | POA: Insufficient documentation

## 2013-08-14 NOTE — Progress Notes (Signed)
  Medical Nutrition Therapy:  Appt start time: 1615 end time:  1630.   Assessment:  Primary concerns today: [name] is here today for her 1st SWL visit in preparation for Gastric Sleeve surgery. She reports having smaller portions and avoiding sweets. She does chair exercises as she is limited physically; arm bands and weights about every other day. Noncompliant with fluid restriction of 2L per day related to CHF.  Preferred Learning Style:  No preference indicated   Learning Readiness:  Contemplating  MEDICATIONS: see list   DIETARY INTAKE:  24-hr recall:  B ( AM): flavored oatmeal  Snk ( AM): none  L ( PM): leftovers from the night before Snk ( PM): none D ( PM): fish, potatoes, green beans Snk ( PM): apples and vinegar  Beverages: Crystal Light, ice  Usual physical activity: chair exercises every other day  Estimated energy needs: 1600 calories  Progress Towards Goal(s):  Some progress.   Nutritional Diagnosis:  Bergoo-3.3 Overweight/obesity related to past poor dietary habits and physical inactivity as evidenced by patient w/ pending Gastric Sleeve surgery following dietary guidelines for continued weight loss.     Intervention:  Nutrition counseling provided. Reviewed pre-op goals and protein shakes per patient request.  Teaching Method Utilized: Visual Auditory   Handouts given during visit include:  Pre op goals  Protein shakes  Barriers to learning/adherence to lifestyle change: physical disability  Demonstrated degree of understanding via:  Teach Back   Monitoring/Evaluation:  Dietary intake, exercise, and body weight in 4 week(s).

## 2013-08-14 NOTE — Patient Instructions (Signed)
-  Continue to limit sweets and portion sizes -Adhere to fluid restriction -Gradually increase chair exercises

## 2013-08-19 ENCOUNTER — Encounter (HOSPITAL_COMMUNITY): Payer: Self-pay | Admitting: Emergency Medicine

## 2013-08-19 ENCOUNTER — Inpatient Hospital Stay (HOSPITAL_COMMUNITY)
Admission: EM | Admit: 2013-08-19 | Discharge: 2013-08-22 | DRG: 292 | Disposition: A | Discharge status: Home / Self Care | Payer: Medicare Other | Attending: Internal Medicine | Admitting: Internal Medicine

## 2013-08-19 ENCOUNTER — Emergency Department (HOSPITAL_COMMUNITY): Payer: Medicare Other

## 2013-08-19 DIAGNOSIS — Z95810 Presence of automatic (implantable) cardiac defibrillator: Secondary | ICD-10-CM

## 2013-08-19 DIAGNOSIS — I129 Hypertensive chronic kidney disease with stage 1 through stage 4 chronic kidney disease, or unspecified chronic kidney disease: Secondary | ICD-10-CM | POA: Diagnosis present

## 2013-08-19 DIAGNOSIS — I5033 Acute on chronic diastolic (congestive) heart failure: Secondary | ICD-10-CM

## 2013-08-19 DIAGNOSIS — E876 Hypokalemia: Secondary | ICD-10-CM | POA: Diagnosis present

## 2013-08-19 DIAGNOSIS — M1710 Unilateral primary osteoarthritis, unspecified knee: Secondary | ICD-10-CM

## 2013-08-19 DIAGNOSIS — K5900 Constipation, unspecified: Secondary | ICD-10-CM | POA: Diagnosis present

## 2013-08-19 DIAGNOSIS — I5043 Acute on chronic combined systolic (congestive) and diastolic (congestive) heart failure: Principal | ICD-10-CM | POA: Diagnosis present

## 2013-08-19 DIAGNOSIS — E119 Type 2 diabetes mellitus without complications: Secondary | ICD-10-CM

## 2013-08-19 DIAGNOSIS — Z87891 Personal history of nicotine dependence: Secondary | ICD-10-CM

## 2013-08-19 DIAGNOSIS — I428 Other cardiomyopathies: Secondary | ICD-10-CM | POA: Diagnosis present

## 2013-08-19 DIAGNOSIS — Z9119 Patient's noncompliance with other medical treatment and regimen: Secondary | ICD-10-CM

## 2013-08-19 DIAGNOSIS — Z91199 Patient's noncompliance with other medical treatment and regimen due to unspecified reason: Secondary | ICD-10-CM

## 2013-08-19 DIAGNOSIS — N183 Chronic kidney disease, stage 3 unspecified: Secondary | ICD-10-CM | POA: Diagnosis present

## 2013-08-19 DIAGNOSIS — J449 Chronic obstructive pulmonary disease, unspecified: Secondary | ICD-10-CM

## 2013-08-19 DIAGNOSIS — G8929 Other chronic pain: Secondary | ICD-10-CM | POA: Diagnosis present

## 2013-08-19 DIAGNOSIS — M25561 Pain in right knee: Secondary | ICD-10-CM

## 2013-08-19 DIAGNOSIS — F4310 Post-traumatic stress disorder, unspecified: Secondary | ICD-10-CM | POA: Diagnosis present

## 2013-08-19 DIAGNOSIS — M25562 Pain in left knee: Secondary | ICD-10-CM

## 2013-08-19 DIAGNOSIS — G4733 Obstructive sleep apnea (adult) (pediatric): Secondary | ICD-10-CM | POA: Diagnosis present

## 2013-08-19 DIAGNOSIS — F209 Schizophrenia, unspecified: Secondary | ICD-10-CM | POA: Diagnosis present

## 2013-08-19 DIAGNOSIS — E873 Alkalosis: Secondary | ICD-10-CM | POA: Diagnosis present

## 2013-08-19 DIAGNOSIS — F329 Major depressive disorder, single episode, unspecified: Secondary | ICD-10-CM

## 2013-08-19 DIAGNOSIS — D509 Iron deficiency anemia, unspecified: Secondary | ICD-10-CM | POA: Diagnosis present

## 2013-08-19 DIAGNOSIS — K219 Gastro-esophageal reflux disease without esophagitis: Secondary | ICD-10-CM | POA: Diagnosis present

## 2013-08-19 DIAGNOSIS — Z86718 Personal history of other venous thrombosis and embolism: Secondary | ICD-10-CM

## 2013-08-19 DIAGNOSIS — R739 Hyperglycemia, unspecified: Secondary | ICD-10-CM | POA: Diagnosis present

## 2013-08-19 DIAGNOSIS — E1121 Type 2 diabetes mellitus with diabetic nephropathy: Secondary | ICD-10-CM | POA: Diagnosis present

## 2013-08-19 DIAGNOSIS — Z6841 Body Mass Index (BMI) 40.0 and over, adult: Secondary | ICD-10-CM

## 2013-08-19 DIAGNOSIS — R252 Cramp and spasm: Secondary | ICD-10-CM

## 2013-08-19 DIAGNOSIS — IMO0001 Reserved for inherently not codable concepts without codable children: Secondary | ICD-10-CM | POA: Diagnosis present

## 2013-08-19 DIAGNOSIS — F3289 Other specified depressive episodes: Secondary | ICD-10-CM

## 2013-08-19 DIAGNOSIS — E1165 Type 2 diabetes mellitus with hyperglycemia: Secondary | ICD-10-CM

## 2013-08-19 DIAGNOSIS — I509 Heart failure, unspecified: Secondary | ICD-10-CM | POA: Diagnosis present

## 2013-08-19 DIAGNOSIS — I498 Other specified cardiac arrhythmias: Secondary | ICD-10-CM | POA: Diagnosis present

## 2013-08-19 DIAGNOSIS — I5032 Chronic diastolic (congestive) heart failure: Secondary | ICD-10-CM | POA: Diagnosis present

## 2013-08-19 LAB — CBC WITH DIFFERENTIAL/PLATELET
Basophils Absolute: 0 10*3/uL (ref 0.0–0.1)
Basophils Relative: 1 % (ref 0–1)
EOS ABS: 0.2 10*3/uL (ref 0.0–0.7)
Eosinophils Relative: 4 % (ref 0–5)
HCT: 34.6 % — ABNORMAL LOW (ref 36.0–46.0)
HEMOGLOBIN: 11.1 g/dL — AB (ref 12.0–15.0)
Lymphocytes Relative: 20 % (ref 12–46)
Lymphs Abs: 0.9 10*3/uL (ref 0.7–4.0)
MCH: 25.4 pg — AB (ref 26.0–34.0)
MCHC: 32.1 g/dL (ref 30.0–36.0)
MCV: 79.2 fL (ref 78.0–100.0)
MONOS PCT: 8 % (ref 3–12)
Monocytes Absolute: 0.4 10*3/uL (ref 0.1–1.0)
Neutro Abs: 3 10*3/uL (ref 1.7–7.7)
Neutrophils Relative %: 67 % (ref 43–77)
Platelets: 262 10*3/uL (ref 150–400)
RBC: 4.37 MIL/uL (ref 3.87–5.11)
RDW: 18.5 % — ABNORMAL HIGH (ref 11.5–15.5)
WBC: 4.4 10*3/uL (ref 4.0–10.5)

## 2013-08-19 LAB — BASIC METABOLIC PANEL
BUN: 12 mg/dL (ref 6–23)
BUN: 14 mg/dL (ref 6–23)
BUN: 15 mg/dL (ref 6–23)
CALCIUM: 9.5 mg/dL (ref 8.4–10.5)
CHLORIDE: 95 meq/L — AB (ref 96–112)
CHLORIDE: 95 meq/L — AB (ref 96–112)
CO2: 27 mEq/L (ref 19–32)
CO2: 27 meq/L (ref 19–32)
CO2: 28 meq/L (ref 19–32)
CREATININE: 1.19 mg/dL — AB (ref 0.50–1.10)
Calcium: 9.2 mg/dL (ref 8.4–10.5)
Calcium: 10 mg/dL (ref 8.4–10.5)
Chloride: 95 mEq/L — ABNORMAL LOW (ref 96–112)
Creatinine, Ser: 1.39 mg/dL — ABNORMAL HIGH (ref 0.50–1.10)
Creatinine, Ser: 1.46 mg/dL — ABNORMAL HIGH (ref 0.50–1.10)
GFR calc Af Amer: 60 mL/min — ABNORMAL LOW (ref >90)
GFR calc Af Amer: 47 mL/min — ABNORMAL LOW (ref >90)
GFR calc non Af Amer: 43 mL/min — ABNORMAL LOW (ref >90)
GFR calc non Af Amer: 40 mL/min — ABNORMAL LOW (ref >90)
GFR, EST AFRICAN AMERICAN: 50 mL/min — AB (ref >90)
GFR, EST NON AFRICAN AMERICAN: 52 mL/min — AB (ref >90)
GLUCOSE: 719 mg/dL — AB (ref 70–99)
Glucose, Bld: 267 mg/dL — ABNORMAL HIGH (ref 70–99)
Glucose, Bld: 204 mg/dL — ABNORMAL HIGH (ref 70–99)
POTASSIUM: 5.3 meq/L (ref 3.7–5.3)
POTASSIUM: 4.1 meq/L (ref 3.7–5.3)
POTASSIUM: 4.1 meq/L (ref 3.7–5.3)
SODIUM: 138 meq/L (ref 137–147)
Sodium: 136 mEq/L — ABNORMAL LOW (ref 137–147)
Sodium: 140 mEq/L (ref 137–147)

## 2013-08-19 LAB — URINALYSIS, ROUTINE W REFLEX MICROSCOPIC
Bilirubin Urine: NEGATIVE
Hgb urine dipstick: NEGATIVE
Ketones, ur: NEGATIVE mg/dL
Leukocytes, UA: NEGATIVE
Nitrite: NEGATIVE
PROTEIN: NEGATIVE mg/dL
Specific Gravity, Urine: 1.016 (ref 1.005–1.030)
Urobilinogen, UA: 0.2 mg/dL (ref 0.0–1.0)
pH: 5 (ref 5.0–8.0)

## 2013-08-19 LAB — TROPONIN I: Troponin I: <0.3 ng/mL (ref <0.30)

## 2013-08-19 LAB — I-STAT ARTERIAL BLOOD GAS, ED
Acid-Base Excess: 6 mmol/L — ABNORMAL HIGH (ref 0.0–2.0)
BICARBONATE: 30.1 meq/L — AB (ref 20.0–24.0)
O2 Saturation: 98 %
PH ART: 7.471 — AB (ref 7.350–7.450)
TCO2: 31 mmol/L (ref 0–100)
pCO2 arterial: 41.2 mmHg (ref 35.0–45.0)
pO2, Arterial: 97 mmHg (ref 80.0–100.0)

## 2013-08-19 LAB — GLUCOSE, CAPILLARY
GLUCOSE-CAPILLARY: 206 mg/dL — AB (ref 70–99)
Glucose-Capillary: 200 mg/dL — ABNORMAL HIGH (ref 70–99)

## 2013-08-19 LAB — PRO B NATRIURETIC PEPTIDE: Pro B Natriuretic peptide (BNP): 559.6 pg/mL — ABNORMAL HIGH (ref 0–125)

## 2013-08-19 LAB — CBG MONITORING, ED
GLUCOSE-CAPILLARY: 422 mg/dL — AB (ref 70–99)
GLUCOSE-CAPILLARY: 214 mg/dL — AB (ref 70–99)
Glucose-Capillary: >600 mg/dL (ref 70–99)
Glucose-Capillary: >600 mg/dL (ref 70–99)

## 2013-08-19 LAB — URINE MICROSCOPIC-ADD ON

## 2013-08-19 LAB — MAGNESIUM: Magnesium: 2.3 mg/dL (ref 1.5–2.5)

## 2013-08-19 MED ORDER — PANTOPRAZOLE SODIUM 40 MG PO TBEC
40.0000 mg | DELAYED_RELEASE_TABLET | Freq: Every day | ORAL | Status: DC
  Administered 2013-08-20 – 2013-08-22 (×3): 40 mg via ORAL
  Filled 2013-08-19 (×2): qty 1

## 2013-08-19 MED ORDER — TORSEMIDE 100 MG PO TABS
100.0000 mg | ORAL_TABLET | Freq: Two times a day (BID) | ORAL | Status: DC
  Administered 2013-08-20 – 2013-08-22 (×5): 100 mg via ORAL
  Filled 2013-08-19 (×9): qty 1

## 2013-08-19 MED ORDER — DOCUSATE SODIUM 100 MG PO CAPS
200.0000 mg | ORAL_CAPSULE | Freq: Every day | ORAL | Status: DC
  Administered 2013-08-20 – 2013-08-22 (×3): 200 mg via ORAL
  Filled 2013-08-19 (×3): qty 2

## 2013-08-19 MED ORDER — POLYETHYLENE GLYCOL 3350 17 G PO PACK
17.0000 g | PACK | Freq: Every day | ORAL | Status: DC | PRN
  Administered 2013-08-22: 17 g via ORAL
  Filled 2013-08-19 (×3): qty 1

## 2013-08-19 MED ORDER — HEPARIN SODIUM (PORCINE) 5000 UNIT/ML IJ SOLN: 5000.0000 [IU] | Freq: Three times a day (TID) | INTRAMUSCULAR | Status: DC

## 2013-08-19 MED ORDER — HYDROCODONE-ACETAMINOPHEN 5-325 MG PO TABS
1.0000 | ORAL_TABLET | Freq: Once | ORAL | Status: AC
  Administered 2013-08-19: 1 via ORAL
  Filled 2013-08-19: qty 1

## 2013-08-19 MED ORDER — METOLAZONE 5 MG PO TABS
5.0000 mg | ORAL_TABLET | ORAL | Status: DC
  Administered 2013-08-20 – 2013-08-22 (×2): 5 mg via ORAL
  Filled 2013-08-19 (×3): qty 1

## 2013-08-19 MED ORDER — INSULIN ASPART 100 UNIT/ML ~~LOC~~ SOLN: 0.0000 [IU] | Freq: Three times a day (TID) | SUBCUTANEOUS | Status: DC

## 2013-08-19 MED ORDER — AMITRIPTYLINE HCL 75 MG PO TABS
150.0000 mg | ORAL_TABLET | Freq: Every day | ORAL | Status: DC
  Administered 2013-08-19 – 2013-08-21 (×3): 150 mg via ORAL
  Filled 2013-08-19 (×5): qty 2

## 2013-08-19 MED ORDER — FUROSEMIDE 10 MG/ML IJ SOLN
60.0000 mg | Freq: Once | INTRAMUSCULAR | Status: AC
  Administered 2013-08-19: 60 mg via INTRAVENOUS
  Filled 2013-08-19: qty 6

## 2013-08-19 MED ORDER — INSULIN GLARGINE 100 UNIT/ML ~~LOC~~ SOLN
10.0000 [IU] | Freq: Every day | SUBCUTANEOUS | Status: DC
  Administered 2013-08-19: 10 [IU] via SUBCUTANEOUS
  Filled 2013-08-19 (×2): qty 0.1

## 2013-08-19 MED ORDER — SODIUM CHLORIDE 0.9 % IV SOLN
INTRAVENOUS | Status: DC
  Administered 2013-08-19: 5.4 [IU]/h via INTRAVENOUS
  Administered 2013-08-19: 10.8 [IU]/h via INTRAVENOUS
  Administered 2013-08-19: 3.1 [IU]/h via INTRAVENOUS
  Filled 2013-08-19: qty 1

## 2013-08-19 MED ORDER — SPIRONOLACTONE 25 MG PO TABS
25.0000 mg | ORAL_TABLET | Freq: Every day | ORAL | Status: DC
  Administered 2013-08-20 – 2013-08-22 (×3): 25 mg via ORAL
  Filled 2013-08-19 (×3): qty 1

## 2013-08-19 MED ORDER — ASPIRIN EC 81 MG PO TBEC
81.0000 mg | DELAYED_RELEASE_TABLET | Freq: Every day | ORAL | Status: DC
  Administered 2013-08-20 – 2013-08-22 (×3): 81 mg via ORAL
  Filled 2013-08-19 (×4): qty 1

## 2013-08-19 MED ORDER — FERROUS SULFATE 325 (65 FE) MG PO TABS
325.0000 mg | ORAL_TABLET | Freq: Two times a day (BID) | ORAL | Status: DC
  Administered 2013-08-20 – 2013-08-22 (×5): 325 mg via ORAL
  Filled 2013-08-19 (×8): qty 1

## 2013-08-19 MED ORDER — INSULIN GLARGINE 100 UNIT/ML ~~LOC~~ SOLN
10.0000 [IU] | Freq: Every day | SUBCUTANEOUS | Status: DC
  Filled 2013-08-19: qty 0.1

## 2013-08-19 MED ORDER — HYDROCODONE-ACETAMINOPHEN 10-325 MG PO TABS
1.0000 | ORAL_TABLET | Freq: Four times a day (QID) | ORAL | Status: DC | PRN
  Administered 2013-08-19 – 2013-08-22 (×6): 1 via ORAL
  Filled 2013-08-19 (×8): qty 1

## 2013-08-19 MED ORDER — INSULIN ASPART 100 UNIT/ML ~~LOC~~ SOLN: 10.0000 [IU] | Freq: Once | SUBCUTANEOUS | Status: DC

## 2013-08-19 MED ORDER — INSULIN ASPART 100 UNIT/ML ~~LOC~~ SOLN
0.0000 [IU] | Freq: Three times a day (TID) | SUBCUTANEOUS | Status: DC
  Administered 2013-08-20: 10:00:00 7 [IU] via SUBCUTANEOUS
  Administered 2013-08-20: 9 [IU] via SUBCUTANEOUS
  Administered 2013-08-20: 3 [IU] via SUBCUTANEOUS
  Administered 2013-08-21: 9 [IU] via SUBCUTANEOUS
  Administered 2013-08-21: 5 [IU] via SUBCUTANEOUS
  Administered 2013-08-21: 08:00:00 9 [IU] via SUBCUTANEOUS
  Administered 2013-08-22: 2 [IU] via SUBCUTANEOUS
  Administered 2013-08-22: 7 [IU] via SUBCUTANEOUS

## 2013-08-19 MED ORDER — INSULIN ASPART 100 UNIT/ML ~~LOC~~ SOLN: 0.0000 [IU] | Freq: Every day | SUBCUTANEOUS | Status: DC

## 2013-08-19 MED ORDER — ISOSORBIDE MONONITRATE ER 60 MG PO TB24
90.0000 mg | ORAL_TABLET | Freq: Every day | ORAL | Status: DC
  Administered 2013-08-20 – 2013-08-22 (×3): 90 mg via ORAL
  Filled 2013-08-19 (×3): qty 1

## 2013-08-19 MED ORDER — HYDRALAZINE HCL 50 MG PO TABS
50.0000 mg | ORAL_TABLET | Freq: Three times a day (TID) | ORAL | Status: DC
  Administered 2013-08-19 – 2013-08-22 (×8): 50 mg via ORAL
  Filled 2013-08-19 (×11): qty 1

## 2013-08-19 MED ORDER — CARVEDILOL 6.25 MG PO TABS
18.7500 mg | ORAL_TABLET | Freq: Two times a day (BID) | ORAL | Status: DC
  Administered 2013-08-20 – 2013-08-22 (×5): 18.75 mg via ORAL
  Filled 2013-08-19 (×8): qty 1

## 2013-08-19 MED ORDER — RIVAROXABAN 20 MG PO TABS
20.0000 mg | ORAL_TABLET | Freq: Every morning | ORAL | Status: DC
  Administered 2013-08-20 – 2013-08-22 (×3): 20 mg via ORAL
  Filled 2013-08-19 (×3): qty 1

## 2013-08-19 NOTE — ED Notes (Signed)
Attempted report 

## 2013-08-19 NOTE — ED Provider Notes (Signed)
CSN: [id number]     Arrival date & time 08/19/13  1258 History   First MD Initiated Contact with Patient 08/19/13 1314     Chief Complaint  Patient presents with  . 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  She has a weekly nurse check on her and today her cbg was > 500. Therefore they sent her here to the ER for further evaluation. She reports that she was been feeling very thirsty, tired, her legs are both swollen and feel tight, 8 lb weight gain in one week, with a cough that started 3 days ago. She denies fevers, sore throat, fall or injury. She denies hx of diabetes. She reports this does feel like a CHF exacerbation.    Past Medical History  Diagnosis Date  . Severe mitral regurgitation     a. Minimally invasive MV repair on 12/18/08. Additionally TV repair  and PFO closure.  . Chronic systolic CHF (congestive heart failure)     a. 12/2011 Echo: EF 30%, diff HK, mild LVH, Gr 1 DD, mildly dil LA. b. 2/2 NICM.  Marland Kitchen Microcytic anemia     a. Small capsule endoscopy 2012 with gastric erosion and small colonic AVMs. b. EGD and colonoscopy before that in 12/11 did not reveal source of bleeding)  . Gastric ulcer   . NICM (nonischemic cardiomyopathy)     a.12/2011: s/p SJM [id number] single lead ICD, Ser #: [id number]  . Obstructive sleep apnea     on CPAP  . GERD (gastroesophageal reflux disease)   . HTN (hypertension)   . Gout   . Depression   . Colitis 2/11  . Chronic back pain   . Chronic knee pain   . DVT (deep venous thrombosis)     right leg  . Anxiety   . Rheumatoid arthritis(714.0)     "shoulders & knees"  . Sleep disturbance     07/14/11 "haven't slept in 10  months; since going to [facility]"  . Hx: UTI (urinary tract infection)   . Nocturia   . COPD (chronic obstructive pulmonary disease)   . Bronchitis   . PTSD (post-traumatic stress disorder)   . Schizophrenia   . Dysmenorrhea   . Morbid obesity   . Acute renal insufficiency     a. ?Cardiorenal 09/2012.  Marland Kitchen Hyperglycemia   . Automatic implantable cardioverter-defibrillator in situ   . Chronic diastolic HF (heart failure)     a. ECHO (05/2013) EF 50-55%, RV dilated and [facility]   Past Surgical History  Procedure Laterality Date  . Cardiac valve replacement  12/2008    cardiac  . Cardiac catheterization  04/20/2009, 11/04/2008  . Right knee arthroscopy  06/11/2004  . Extraction of teeth  12/04/2008  . Right mini thoracotomy for mitral valve repair and closure of [place]  12/18/2008  . Cardiac defibrillator placement     Family History  Problem Relation Age of Onset  . Hypertension Sister   . Alcoholism Father     died in his 71's or 15's  . Other Mother     alive & well @ 85.   History  Substance Use Topics  . Smoking status: Former Smoker -- 0.50 packs/day for 27 years    Types: Cigarettes  Quit date: 12/18/2008  . Smokeless tobacco: Never Used  . Alcohol Use: No     Comment: "stopped drinking alcohol 12/2003; did drink ~ 6 shots/wk"   OB History   Grav Para Term Preterm Abortions TAB SAB Ect Mult Living   5 5 5       5      Review of Systems  Constitutional: Positive for unexpected weight change. Negative for fever, diaphoresis and fatigue.  HENT: Negative for congestion and ear pain.   Respiratory: Positive for cough and shortness of breath.   Cardiovascular: Positive for leg swelling. Negative for chest pain.  Gastrointestinal: Negative for abdominal pain and constipation.  Genitourinary: Positive for frequency. Negative for dysuria.  Musculoskeletal: Positive for arthralgias.  Neurological: Positive for weakness.      Allergies  Risperidone; Colchicine; Morphine; and  Shrimp  Home Medications   Current Outpatient Rx  Name  Route  Sig  Dispense  Refill  . amitriptyline (ELAVIL) 150 MG tablet   Oral   Take 150 mg by mouth at bedtime.         . carvedilol (COREG) 12.5 MG tablet   Oral   Take 1.5 tablets (18.75 mg total) by mouth 2 (two) times daily with a meal.   90 tablet   3   . diclofenac sodium (VOLTAREN) 1 % GEL   Topical   Apply 4 g topically 4 (four) times daily.   1 Tube   3   . docusate sodium (COLACE) 100 MG capsule   Oral   Take 200 mg by mouth daily.          . Febuxostat 80 MG TABS   Oral   Take 80 mg by mouth daily.          . ferrous sulfate 325 (65 FE) MG tablet   Oral   Take 1 tablet (325 mg total) by mouth 2 (two) times daily with a meal.   180 tablet   1   . hydrALAZINE (APRESOLINE) 50 MG tablet   Oral   Take 1 tablet (50 mg total) by mouth 3 (three) times daily.   90 tablet   6   . HYDROcodone-acetaminophen (NORCO) 10-325 MG per tablet   Oral   Take 1 tablet by mouth every 6 (six) hours as needed for severe pain.   120 tablet   0   . isosorbide mononitrate (IMDUR) 60 MG 24 hr tablet   Oral   Take 90 mg by mouth daily.         . metolazone (ZAROXOLYN) 5 MG tablet   Oral   Take 1 tablet (5 mg total) by mouth 4 (four) times a week. Monday,Wednesday,Friday and Saturday.  30 minutes before torsemide   20 tablet   3     Please cancel all previous orders for current medi ...   . omeprazole (PRILOSEC) 40 MG capsule   Oral   Take 1 capsule (40 mg total) by mouth daily.   30 capsule   3   . polyethylene glycol (MIRALAX / GLYCOLAX) packet   Oral   Take 17 g by mouth daily as needed for moderate constipation.         . potassium chloride SA (K-DUR,KLOR-CON) 20 MEQ tablet   Oral   Take 40 mEq by mouth 2 (two) times daily.         . Rivaroxaban (XARELTO) 20 MG TABS tablet   Oral   Take 20 mg by mouth every morning.         Sunday  spironolactone (ALDACTONE) 25 MG tablet   Oral   Take 25 mg  by mouth daily.         Marland Kitchen torsemide (DEMADEX) 100 MG tablet   Oral   Take 1 tablet (100 mg total) by mouth 2 (two) times daily.   180 tablet   3    BP 114/70  Pulse 95  Temp(Src) 97.6 F (36.4 C)  Resp 12  SpO2 97%  LMP 05/05/2012 Physical Exam  Nursing note and vitals reviewed. Constitutional: She appears well-developed and well-nourished. She appears distressed.  Dry mouth  HENT:  Head: Normocephalic and atraumatic.  Eyes: Pupils are equal, round, and reactive to light.  Neck: Normal range of motion. Neck supple.  Cardiovascular: Normal rate and regular rhythm.   Murmur heard. Pulmonary/Chest: Effort normal. She has no decreased breath sounds. She has no wheezes. She has rales.  Coughing on exam  Abdominal: Soft. She exhibits no distension.  Exam limited by body habitus.  Musculoskeletal:  Lower extremity edema with palpable pedal pulses  Neurological: She is alert.  Skin: Skin is warm and dry.    ED Course  Procedures (including critical care time) Labs Review Labs Reviewed  CBC WITH DIFFERENTIAL - Abnormal; Notable for the following:    Hemoglobin 11.1 (*)    HCT 34.6 (*)    MCH 25.4 (*)    RDW 18.5 (*)    All other components within normal limits  URINALYSIS, ROUTINE W REFLEX MICROSCOPIC - Abnormal; Notable for the following:    APPearance CLOUDY (*)    Glucose, UA >1000 (*)    All other components within normal limits  BASIC METABOLIC PANEL - Abnormal; Notable for the following:    Sodium 136 (*)    Chloride 95 (*)    Glucose, Bld 719 (*)    Creatinine, Ser 1.19 (*)    GFR calc non Af Amer 52 (*)    GFR calc Af Amer 60 (*)    All other components within normal limits  PRO B NATRIURETIC PEPTIDE - Abnormal; Notable for the following:    Pro B Natriuretic peptide (BNP) 559.6 (*)    All other components within normal limits  URINE MICROSCOPIC-ADD ON - Abnormal; Notable for the following:    Squamous Epithelial / LPF FEW (*)    All other components  within normal limits  CBG MONITORING, ED - Abnormal; Notable for the following:    Glucose-Capillary >600 (*)    All other components within normal limits  I-STAT ARTERIAL BLOOD GAS, ED - Abnormal; Notable for the following:    pH, Arterial 7.471 (*)    Bicarbonate 30.1 (*)    Acid-Base Excess 6.0 (*)    All other components within normal limits  TROPONIN I  CBG MONITORING, ED   Imaging Review Dg Chest Portable 1 View  08/19/2013   CLINICAL DATA:  Hyperglycemia.  EXAM: PORTABLE CHEST - 1 VIEW  COMPARISON:  07/19/2013.  FINDINGS: Examination limited by portable technique and patient's habitus. This particularly limits evaluation of the right thorax.  AICD is in place. Cardiomegaly. Central pulmonary vascular prominence.  No gross pneumothorax.  No segmental consolidation.  The patient would eventually benefit from two view chest.  IMPRESSION: Examination limited by portable technique and patient's habitus. This particularly limits evaluation of the right thorax.  AICD is in place. Cardiomegaly. Central pulmonary vascular prominence.  The patient would eventually benefit from two view chest.   Electronically Signed   By: Kandice Hams.D.  On: 08/19/2013 14:46     EKG Interpretation   Date/Time:  Monday August 19 2013 13:59:19 EDT Ventricular Rate:  92 PR Interval:  170 QRS Duration: 80 QT Interval:  430 QTC Calculation: 532 R Axis:   42 Text Interpretation:  Sinus rhythm Nonspecific T abnormalities, diffuse  leads Prolonged QT interval No significant change was found Confirmed by  CAMPOS  MD, Caryn Bee ([id number]) on 08/19/2013 2:10:43 PM      MDM   Final diagnoses:  Diabetes mellitus, new onset  Chronic CHF (congestive heart failure)  Hyperglycemia   Pt is sating at 97 % on room air. Her BP is 114/70, no temperature and no tachycardia. She is hemodynamically stable. She is having a CHF exacerbation with an elevated BNP at 559 and pulmonary congestion on chest xray. Her  complaint for  presenting to the ED is the elevated glucose. A gluco stabilizer has been ordered with gentle fluids.  Her ABG shows Primary metabolic alkalosis, with full respiratory compensation  Pt requested pain medication, which was given. Her pain is currently managed.  Patient to be admitted into this hospital.  Internal medicine residents to admit, inpatient, will come down and do admitting orders. Pt remains hemodynamically stable and glucostabilizer is actively going.  Dorthula Matas, PA-C 08/19/13 1615

## 2013-08-19 NOTE — ED Notes (Signed)
Was seen by weakly check and cbg was over 500 and she has gained 8# since last week

## 2013-08-19 NOTE — ED Provider Notes (Signed)
Medical screening examination/treatment/procedure(s) were performed by non-physician practitioner and as supervising physician I was immediately available for consultation/collaboration.   EKG Interpretation   Date/Time:  Monday August 19 2013 13:59:19 EDT Ventricular Rate:  92 PR Interval:  170 QRS Duration: 80 QT Interval:  430 QTC Calculation: 532 R Axis:   42 Text Interpretation:  Sinus rhythm Nonspecific T abnormalities, diffuse  leads Prolonged QT interval No significant change was found Confirmed by  Willis Holquin  MD, Dollie Bressi ([id number]) on 08/19/2013 2:10:43 PM        Lyanne Co, MD 08/19/13 [phone]

## 2013-08-19 NOTE — H&P (Signed)
Date: 08/19/2013               Patient Name:  [name] MRN: [id number]  DOB: [birth year] Age / Sex: 53 y.o., female   PCP: Manuela Schwartz, MD         Medical Service: Internal Medicine Teaching Service         Attending Physician: Dr. Farley Ly, MD    First Contact: Dr. Vivi Barrack Pager: [phone]  Second Contact: Dr. Charlsie Merles Pager: [phone]       After Hours (After 5p/  First Contact Pager: [phone]  weekends / holidays): Second Contact Pager: [phone]   Chief Complaint: High sugar and weight gain  History of Present Illness:  [name] is a 53 y.o. 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  Patient has a home health nurse who comes to her house weekly. She checked her blood sugars today and they were greater than 500. She was thus sent to the ER for further evaluation. She denies a history of diabetes. She reports feeling very thirsty and endorses polyuria since Friday. She admits to eating a lot of unhealthy food this Easter weekend, including sweets and a salty ham.  Because of her thirst, she admits to not adhering to her usual fluid restriction diet for her CHF. Since Friday she reports leg swelling and an 8 pound weight gain. Denies chest pain or shortness of breath. Denies orthopnea or PND. She had some NBNB vomiting and diarrhea over the weekend, but this has resolved. She denies fevers, chills, abdominal pain. She endorses some cramping in her legs and hands. She's been compliant with her medications.  She quit smoking in 2010. Does not use alcohol or drugs.  In the ED she was given Lasix IV 60 mg x1, Norco 1 tab by mouth x1, and she was started on an insulin drip.   Meds:  Current Outpatient  Prescriptions  Medication Sig Dispense Refill  . amitriptyline (ELAVIL) 150 MG tablet Take 150 mg by mouth at bedtime.      . carvedilol (COREG) 12.5 MG tablet Take 1.5 tablets (18.75 mg total) by mouth 2 (two) times daily with a meal.  90 tablet  3  . diclofenac sodium (VOLTAREN) 1 % GEL Apply 4 g topically 4 (four) times daily.  1 Tube  3  . docusate sodium (COLACE) 100 MG capsule Take 200 mg by mouth daily.       . Febuxostat 80 MG TABS Take 80 mg by mouth daily.       . ferrous sulfate 325 (65 FE) MG tablet Take 1 tablet (325 mg total) by mouth 2 (two) times daily with a meal.  180 tablet  1  . hydrALAZINE (APRESOLINE) 50 MG tablet Take 1 tablet (50 mg total) by mouth 3 (three) times daily.  90 tablet  6  . HYDROcodone-acetaminophen (NORCO) 10-325 MG per tablet Take 1 tablet by mouth every 6 (six) hours as needed for severe pain.  120 tablet  0  . isosorbide mononitrate (IMDUR) 60 MG 24 hr tablet Take 90 mg by mouth daily.      . metolazone (ZAROXOLYN) 5 MG tablet Take 1 tablet (5 mg total) by mouth 4 (four) times a week. Monday,Wednesday,Friday and Saturday.  30  minutes before torsemide  20 tablet  3  . omeprazole (PRILOSEC) 40 MG capsule Take 1 capsule (40 mg total) by mouth daily.  30 capsule  3  . polyethylene glycol (MIRALAX / GLYCOLAX) packet Take 17 g by mouth daily as needed for moderate constipation.      . potassium chloride SA (K-DUR,KLOR-CON) 20 MEQ tablet Take 40 mEq by mouth 2 (two) times daily.      . Rivaroxaban (XARELTO) 20 MG TABS tablet Take 20 mg by mouth every morning.      Marland Kitchen spironolactone (ALDACTONE) 25 MG tablet Take 25 mg by mouth daily.      Marland Kitchen torsemide (DEMADEX) 100 MG tablet Take 1 tablet (100 mg total) by mouth 2 (two) times daily.  180 tablet  3  . [DISCONTINUED] QUEtiapine (SEROQUEL) 100 MG tablet Take 400 mg by mouth at bedtime.         Allergies: Allergies as of 08/19/2013 - Review Complete 08/19/2013  Allergen Reaction Noted  . Risperidone Other (See  Comments) 03/21/2011  . Colchicine Hives and Itching 11/13/2006  . Morphine Itching 05/21/2010  . Shrimp [shellfish allergy] Hives and Itching 01/25/2012   Past Medical History  Diagnosis Date  . Severe mitral regurgitation     a. Minimally invasive MV repair on 12/18/08. Additionally TV repair  and PFO closure.  . Chronic systolic CHF (congestive heart failure)     a. 12/2011 Echo: EF 30%, diff HK, mild LVH, Gr 1 DD, mildly dil LA. b. 2/2 NICM.  Marland Kitchen Microcytic anemia     a. Small capsule endoscopy 2012 with gastric erosion and small colonic AVMs. b. EGD and colonoscopy before that in 12/11 did not reveal source of bleeding)  . Gastric ulcer   . NICM (nonischemic cardiomyopathy)     a.12/2011: s/p SJM 1311-36Q single lead ICD, Ser #: [id number]  . Obstructive sleep apnea     on CPAP  . GERD (gastroesophageal reflux disease)   . HTN (hypertension)   . Gout   . Depression   . Colitis 2/11  . Chronic back pain   . Chronic knee pain   . DVT (deep venous thrombosis)     right leg  . Anxiety   . Rheumatoid arthritis(714.0)     "shoulders & knees"  . Sleep disturbance     07/14/11 "haven't slept in 10 months; since going to [facility]"  . Hx: UTI (urinary tract infection)   . Nocturia   . COPD (chronic obstructive pulmonary disease)   . Bronchitis   . PTSD (post-traumatic stress disorder)   . Schizophrenia   . Dysmenorrhea   . Morbid obesity   . Acute renal insufficiency     a. ?Cardiorenal 09/2012.  Marland Kitchen Hyperglycemia   . Automatic implantable cardioverter-defibrillator in situ   . Chronic diastolic HF (heart failure)     a. ECHO (05/2013) EF 50-55%, RV dilated and [facility]   Past Surgical History  Procedure Laterality Date  . Cardiac valve replacement  12/2008    cardiac  . Cardiac catheterization  04/20/2009, 11/04/2008  . Right knee arthroscopy  06/11/2004  . Extraction of teeth  12/04/2008  . Right mini thoracotomy for mitral valve repair and closure of [place]  12/18/2008  . Cardiac  defibrillator placement     Family History  Problem Relation Age of Onset  . Hypertension Sister   . Alcoholism Father     died in his 19's or 68's  . Other Mother  alive & well @ 1679.   History   Social History  . Marital Status: Single    Spouse Name: N/A    Number of Children: N/A  . Years of Education: N/A   Occupational History  . disability    Social History Main Topics  . Smoking status: Former Smoker -- 0.50 packs/day for 27 years    Types: Cigarettes    Quit date: 12/18/2008  . Smokeless tobacco: Never Used  . Alcohol Use: No     Comment: "stopped drinking alcohol 12/2003; did drink ~ 6 shots/wk"  . Drug Use: No     Comment: "last cocaine use 2009"  . Sexual Activity: Not on file   Other Topics Concern  . Not on file   Social History Narrative   [place] dtr.  On disability.    Review of Systems: Pertinent items are noted in HPI.  Physical Exam: Blood pressure 112/69, pulse 95, temperature 97.6 F (36.4 C), resp. rate 14, last menstrual period 05/05/2012, SpO2 97.00%. Physical Exam  Constitutional: She is oriented to person, place, and time and well-developed, well-nourished, and in no distress.  Morbidly obese  HENT:  Head: Normocephalic and atraumatic.  Eyes: Conjunctivae and EOM are normal. Pupils are equal, round, and reactive to light.  Neck: Normal range of motion. Neck supple.  Cardiovascular: Normal rate, regular rhythm and normal heart sounds.  Exam reveals no gallop and no friction rub.   No murmur heard. Exam limited by body habitus  Pulmonary/Chest: Effort normal. No respiratory distress. She has no wheezes. She has rales (Perhaps some fine crackles at the bases bilaterally). She exhibits no tenderness.  Exam limited by body habitus  Abdominal: Soft. Bowel sounds are normal. She exhibits no distension. There is no tenderness.  Musculoskeletal: Normal range of motion. She exhibits edema (2 pitting edema to the knees  bilaterally). She exhibits no tenderness.  Neurological: She is alert and oriented to person, place, and time. No cranial nerve deficit. GCS score is 15.  Skin: Skin is warm and dry.  Psychiatric: Affect normal.     Lab results: Basic Metabolic Panel:  Recent Labs  16/02/9603/06/15 1340  NA 136*  K 5.3  CL 95*  CO2 27  GLUCOSE 719*  BUN 12  CREATININE 1.19*  CALCIUM 9.2   CBC:  Recent Labs  08/19/13 1340  WBC 4.4  NEUTROABS 3.0  HGB 11.1*  HCT 34.6*  MCV 79.2  PLT 262   Cardiac Enzymes:  Recent Labs  08/19/13 1340  TROPONINI <0.30   BNP:  Recent Labs  08/19/13 1340  PROBNP 559.6*   CBG:  Recent Labs  08/19/13 1326 08/19/13 1637 08/19/13 1745  GLUCAP >600* >600* 422*   Urinalysis:  Recent Labs  08/19/13 1404  COLORURINE COLORLESS  LABSPEC 1.016  PHURINE 5.0  GLUCOSEU >1000*  HGBUR NEGATIVE  BILIRUBINUR NEGATIVE  KETONESUR NEGATIVE  PROTEINUR NEGATIVE  UROBILINOGEN 0.2  NITRITE NEGATIVE  LEUKOCYTESUR NEGATIVE    Imaging results:  Dg Chest Portable 1 View  08/19/2013   CLINICAL DATA:  Hyperglycemia.  EXAM: PORTABLE CHEST - 1 VIEW  COMPARISON:  07/19/2013.  FINDINGS: Examination limited by portable technique and patient's habitus. This particularly limits evaluation of the right thorax.  AICD is in place. Cardiomegaly. Central pulmonary vascular prominence.  No gross pneumothorax.  No segmental consolidation.  The patient would eventually benefit from two view chest.  IMPRESSION: Examination limited by portable technique and patient's habitus. This particularly limits  evaluation of the right thorax.  AICD is in place. Cardiomegaly. Central pulmonary vascular prominence.  The patient would eventually benefit from two view chest.   Electronically Signed   By: Bridgett Larsson M.D.   On: 08/19/2013 14:46    Other results: EKG: Normal sinus rhythm, rate 92, normal axis, T-wave inversions V2-V5, and the inversions in V2 and V3 are new from prior study date  07/19/13.  Assessment & Plan by Problem: [name] is a 53 y.o. 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  #Hyperglycemia in the setting of DM2 - Patient endorses polyuria, polydipsia x3 days. Glucose elevated here to 719. She is on no diabetes medications and does not appear to carry a diagnosis of diabetes in her problem list, despite elevated A1C last year of 6.7 in 05/14. Glucose was 140s-220s on prior BMPs over the past 2 months. This is likely an acute on chronic process in the setting of dietary noncompliance over the Easter weekend. Do not suspect DKA as urine is negative for ketones, her anion gap is 11-14, no acidosis on ABG (below). She does have >1000 glucose in her urine. - Admit to IMTS, SDU - Check hemoglobin A1c - Insulin drip per protocol - CBG q1h - BMP q2h - Once sugars below 200 x2, will switch to Lantus 10u with SSI-moderate - N.p.o. except for sips with meds  ABG    Component Value Date/Time   PHART 7.471* 08/19/2013 1354   PCO2ART 41.2 08/19/2013 1354   PO2ART 97.0 08/19/2013 1354   HCO3 30.1* 08/19/2013 1354   TCO2 31 08/19/2013 1354   ACIDBASEDEF 3.0* 12/18/2008 2135   O2SAT 98.0 08/19/2013 1354    #Acute on chronic combined CHF - Patient reports subjective weight gain of 8 pounds and leg swelling. This is in the setting of dietary noncompliance over Easter weekend, and polydipsia due to uncontrolled diabetes. We will confirm her weight gain with an objective weight (not done yet in ED). She was 390lbs on 4/1. ProBNP is elevated 559. 2-D echo on 06/04/2013 showed EF 50-55%, normal systolic function, abnormal septal motion, mitral valve status post repair and ring, no stenosis or residual regurgitation, right ventricle appears dilated and  hypokinetic. EKG showed non-specific TWI in anterior precordial leads so we will perform ischemic rule out. First troponin is negative. She is status post Lasix 60 mg IV x1. Restarting home diuretics. - Cardiac monitoring - Trending troponins - Repeat EKG in am - Repeat CXR (obtain 2-view) in am - Daily ASA - Continue home Coreg 18.75 mg twice a day - Continue home hydralazine 50 mg 3 times a day - Continue home Imdur 90 mg daily - Continue home Aldactone 25 mg daily - Continue home torsemide 100 mg twice a day - Continue home metolazone 5 mg once per day on Sunday, Tuesday, Thursday, Saturday - Daily weights - I/Os - Norco 10-325 q6h prn pain - Trending BMP as above  #Hand cramping - Unclear etiology, could be related to electrolyte abnormality. We will keep a close eye on her K. K was 5.3 on admission, no concerning EKG changes. She is s/p Lasix and remains on the insuin gtt. - Trending BMPs as above  #HTN - Normotensive currently. Continue home medications as above.  #CKD 3 - Cr  actually improved today compared to recent values, at 1.19. GFR 60. - Continue to monitor  Creatinine, Ser  Date Value Ref Range Status  08/19/2013 1.19* 0.50 - 1.10 mg/dL Final  8/0/0349 1.79* 1.50 - 1.10 mg/dL Final  09/18/9792 8.01* 6.55 - 1.10 mg/dL Final  3/74/8270 7.86* 0.50 - 1.10 mg/dL Final  7/54/4920 1.00* 0.50 - 1.10 mg/dL Final    #Depression - Continue home amitriptyline 150 mg daily  #History of microcytic anemia - Baseline hemoglobin is 10-11. Stable today. - Continue iron supplementation with ferrous sulfate 325 mg twice a day - CBC in am  #GERD - Continue PPI, Protonix 40 mg daily  #Constipation - Continue home Colace 200 mg daily, MiraLAX 17 g daily as needed.  #DVT PPX - Patient has a history of bilateral femoral DVTs, continue home Xarelto  Dispo: Disposition is deferred at this time, awaiting improvement of current medical problems. Anticipated discharge in approximately 1-3  day(s).   The patient does have a current PCP [facility] Montey Hora, MD) and does need an [facility] hospital follow-up appointment after discharge.  The patient does not have transportation limitations that hinder transportation to clinic appointments.  Signed: Vivi Barrack, MD 08/19/2013, 6:09 PM   Vivi Barrack, MD  [email or web address] Pager # [phone] Office # [phone]

## 2013-08-20 ENCOUNTER — Inpatient Hospital Stay (HOSPITAL_COMMUNITY): Payer: Medicare Other

## 2013-08-20 LAB — GLUCOSE, CAPILLARY
GLUCOSE-CAPILLARY: 292 mg/dL — AB (ref 70–99)
Glucose-Capillary: 251 mg/dL — ABNORMAL HIGH (ref 70–99)
Glucose-Capillary: 296 mg/dL — ABNORMAL HIGH (ref 70–99)
Glucose-Capillary: 318 mg/dL — ABNORMAL HIGH (ref 70–99)
Glucose-Capillary: 331 mg/dL — ABNORMAL HIGH (ref 70–99)
Glucose-Capillary: 408 mg/dL — ABNORMAL HIGH (ref 70–99)
Glucose-Capillary: 248 mg/dL — ABNORMAL HIGH (ref 70–99)

## 2013-08-20 LAB — BASIC METABOLIC PANEL
BUN: 17 mg/dL (ref 6–23)
CO2: 29 meq/L (ref 19–32)
CREATININE: 1.44 mg/dL — AB (ref 0.50–1.10)
Calcium: 9 mg/dL (ref 8.4–10.5)
Chloride: 95 mEq/L — ABNORMAL LOW (ref 96–112)
GFR calc Af Amer: 47 mL/min — ABNORMAL LOW (ref >90)
GFR calc non Af Amer: 41 mL/min — ABNORMAL LOW (ref >90)
GLUCOSE: 331 mg/dL — AB (ref 70–99)
Potassium: 3.9 mEq/L (ref 3.7–5.3)
Sodium: 138 mEq/L (ref 137–147)

## 2013-08-20 LAB — HEMOGLOBIN A1C
HEMOGLOBIN A1C: 13 % — AB (ref <5.7)
Mean Plasma Glucose: 326 mg/dL — ABNORMAL HIGH (ref <117)

## 2013-08-20 LAB — URINALYSIS, ROUTINE W REFLEX MICROSCOPIC
Bilirubin Urine: NEGATIVE
GLUCOSE, UA: 500 mg/dL — AB
Hgb urine dipstick: NEGATIVE
Ketones, ur: NEGATIVE mg/dL
Nitrite: NEGATIVE
PROTEIN: NEGATIVE mg/dL
SPECIFIC GRAVITY, URINE: 1.017 (ref 1.005–1.030)
Urobilinogen, UA: 0.2 mg/dL (ref 0.0–1.0)
pH: 7.5 (ref 5.0–8.0)

## 2013-08-20 LAB — TROPONIN I

## 2013-08-20 LAB — URINE MICROSCOPIC-ADD ON

## 2013-08-20 MED ORDER — FUROSEMIDE 10 MG/ML IJ SOLN
60.0000 mg | Freq: Once | INTRAMUSCULAR | Status: AC
  Administered 2013-08-20: 60 mg via INTRAVENOUS
  Filled 2013-08-20: qty 6

## 2013-08-20 MED ORDER — PROMETHAZINE HCL 12.5 MG PO TABS: 12.5000 mg | ORAL_TABLET | Freq: Four times a day (QID) | ORAL | Status: DC | PRN

## 2013-08-20 MED ORDER — DM-GUAIFENESIN ER 30-600 MG PO TB12
1.0000 | ORAL_TABLET | Freq: Two times a day (BID) | ORAL | Status: DC
  Administered 2013-08-20 – 2013-08-22 (×5): 1 via ORAL
  Filled 2013-08-20 (×8): qty 1

## 2013-08-20 MED ORDER — INSULIN GLARGINE 100 UNIT/ML ~~LOC~~ SOLN
25.0000 [IU] | Freq: Every day | SUBCUTANEOUS | Status: DC
  Administered 2013-08-20: 22:00:00 25 [IU] via SUBCUTANEOUS
  Filled 2013-08-20 (×2): qty 0.25

## 2013-08-20 MED ORDER — LIVING WELL WITH DIABETES BOOK
Freq: Once | Status: AC
  Administered 2013-08-20: 15:00:00
  Filled 2013-08-20: qty 1

## 2013-08-20 MED ORDER — PROMETHAZINE HCL 25 MG/ML IJ SOLN: 12.5000 mg | Freq: Four times a day (QID) | INTRAMUSCULAR | Status: DC | PRN

## 2013-08-20 NOTE — Progress Notes (Signed)
[facility]  Patient Status: Active (receiving services up to time of hospitalization)  [facility] is providing the following services: RN  If patient discharges after hours, please call [phone].   [name] 08/20/2013, 1:07 PM

## 2013-08-20 NOTE — Progress Notes (Signed)
Subjective: Patient seen and examined at the bedside this morning. She is sitting on the commode. Still without SOB. She endorses a nonproductive cough, also ongoing since late last week. She tells me she vomited x1 overnight, NBNB. She remains quite thirsty. She asks me why she has a Splenda packet instead of sugar for her coffee, and I explained that she is on a diabetic diet here. She tells me she thinks she may have a UTI. Denies dysuria or suprapubic pain but notes a "smell." Denies vaginal discharge. She is amenable to speaking with the diabetes coordinator today.  Objective: Vital signs in last 24 hours: Filed Vitals:   08/19/13 1647 08/19/13 1922 08/19/13 2300 08/20/13 0404  BP:  103/75 110/87 118/97  Pulse: 95 99 102 101  Temp:   97.7 F (36.5 C) 98.7 F (37.1 C)  TempSrc:   Oral Oral  Resp:  20 18 18   Height:  5\' 6"  (1.676 m)    Weight:  388 lb 14.4 oz (176.404 kg) 388 lb 14.3 oz (176.4 kg)   SpO2: 97% 97% 97% 98%   Weight change:  No intake or output data in the 24 hours ending 08/20/13   Physical Exam  Constitutional: She is oriented to person, place, and time and well-developed, well-nourished, and in no distress.  Morbidly obese, sitting on the commode HENT:  Head: Normocephalic and atraumatic.  Eyes: Conjunctivae and EOM are normal. Pupils are equal, round, and reactive to light.  Neck: Normal range of motion. Neck supple.  Cardiovascular: Tachycardia to 115, regular rhythm and normal heart sounds. Exam reveals no gallop and no friction rub.  No murmur heard. Exam limited by body habitus  Pulmonary/Chest: Effort normal. No respiratory distress. She has no wheezes. No rales today. She exhibits no tenderness. Dry cough occasionally during exam. Exam limited by body habitus  Abdominal: Soft. Bowel sounds are normal. She exhibits no distension. There is no tenderness.  Musculoskeletal: Normal range of motion. She exhibits edema (2 pitting edema to the knees  bilaterally). She exhibits no tenderness.  Neurological: She is alert and oriented to person, place, and time. No cranial nerve deficit. GCS score is 15.  Skin: Skin is warm and dry.  Psychiatric: Affect normal.   Lab Results: Basic Metabolic Panel:  Recent Labs Lab 08/19/13 1832 08/19/13 2126 08/20/13 0557  NA 140 138 138  K 4.1 4.1 3.9  CL 95* 95* 95*  CO2 27 28 29   GLUCOSE 267* 204* 331*  BUN 14 15 17   CREATININE 1.39* 1.46* 1.44*  CALCIUM 10.0 9.5 9.0  MG 2.3  --   --    CBC:  Recent Labs Lab 08/19/13 1340  WBC 4.4  NEUTROABS 3.0  HGB 11.1*  HCT 34.6*  MCV 79.2  PLT 262   Cardiac Enzymes:  Recent Labs Lab 08/19/13 2126 08/20/13 0156 08/20/13 0557  TROPONINI <0.30 <0.30 <0.30   BNP:  Recent Labs Lab 08/19/13 1340  PROBNP 559.6*   CBG:  Recent Labs Lab 08/19/13 1852 08/19/13 1957 08/19/13 2111 08/19/13 2249 08/20/13 0224 08/20/13 0639  GLUCAP 214* 206* 200* 251* 296* 318*   Urinalysis:  Recent Labs Lab 08/19/13 1404  COLORURINE COLORLESS  LABSPEC 1.016  PHURINE 5.0  GLUCOSEU >1000*  HGBUR NEGATIVE  BILIRUBINUR NEGATIVE  KETONESUR NEGATIVE  PROTEINUR NEGATIVE  UROBILINOGEN 0.2  NITRITE NEGATIVE  LEUKOCYTESUR NEGATIVE    Micro Results: No results found for this or any previous visit (from the past 240 hour(s)). Studies/Results: Dg Chest  2 View  08/20/2013   CLINICAL DATA:  Pulmonary edema, complaining of dry cough  EXAM: CHEST  2 VIEW  COMPARISON:  DG CHEST 1V PORT dated 08/19/2013; DG CHEST 2 VIEW dated 07/19/2013; DG CHEST 2 VIEW dated 09/14/2012  FINDINGS: Stable cardiac pacer with mild to moderate cardiac enlargement. Replacement cardiac valve noted. Vascular pattern within normal limits with no evidence of pulmonary edema or pleural effusion. Triangular focus of opacity based against the lateral pleura in the right mid lung zone. This is in an area of prior scarring or discoid atelectasis.  IMPRESSION: Mild lateral right mid lung zone  likely peripheral atelectasis in combination with scarring. .   Electronically Signed   By: Esperanza Heir M.D.   On: 08/20/2013 07:58   Dg Chest Portable 1 View  08/19/2013   CLINICAL DATA:  Hyperglycemia.  EXAM: PORTABLE CHEST - 1 VIEW  COMPARISON:  07/19/2013.  FINDINGS: Examination limited by portable technique and patient's habitus. This particularly limits evaluation of the right thorax.  AICD is in place. Cardiomegaly. Central pulmonary vascular prominence.  No gross pneumothorax.  No segmental consolidation.  The patient would eventually benefit from two view chest.  IMPRESSION: Examination limited by portable technique and patient's habitus. This particularly limits evaluation of the right thorax.  AICD is in place. Cardiomegaly. Central pulmonary vascular prominence.  The patient would eventually benefit from two view chest.   Electronically Signed   By: Bridgett Larsson M.D.   On: 08/19/2013 14:46   Medications: I have reviewed the patient's current medications. Scheduled Meds: . amitriptyline  150 mg Oral QHS  . aspirin EC  81 mg Oral Daily  . carvedilol  18.75 mg Oral BID WC  . docusate sodium  200 mg Oral Daily  . ferrous sulfate  325 mg Oral BID WC  . hydrALAZINE  50 mg Oral TID  . insulin aspart  0-9 Units Subcutaneous TID WC  . insulin glargine  10 Units Subcutaneous QHS  . isosorbide mononitrate  90 mg Oral Daily  . metolazone  5 mg Oral Once per day on Sun Tue Thu Sat  . pantoprazole  40 mg Oral Daily  . Rivaroxaban  20 mg Oral q morning - 10a  . spironolactone  25 mg Oral Daily  . torsemide  100 mg Oral BID   Continuous Infusions:  PRN Meds:.HYDROcodone-acetaminophen, polyethylene glycol  Assessment/Plan: SONDOS PIO is a 53 y.o. 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   #Hyperglycemia in the setting of DM2 - Overnight she was converted from insulin drip to Lantus 10 units plus SSI. Today we will focus on adjusting this regimen for home, and educating the patient on insulin administration and her disease process. BMP this morning is wnl, K 3.9, AG 14. - Consult to inpatient diabetes coordinator - Follow up hemoglobin A1c (last night's order was cancelled, I have reordered) - Carb modified diet - CBG AC and QHS  - Phenergan prn nausea - BMP in am  Glucose-Capillary  Date Value Ref Range Status  08/20/2013 318* 70 - 99 mg/dL Final  01/17/7168 678* 70 - 99 mg/dL Final  01/16/8100 751* 70 - 99 mg/dL Final  0/06/5850 778* 70 - 99 mg/dL Final  06/20/2351 614* 70 - 99 mg/dL Final  08/17/1538 086*  70 - 99 mg/dL Final  0/07/5463 681* 70 - 99 mg/dL Final  06/22/5168 >017* 70 - 99 mg/dL Final  08/23/4494 >759* 70 - 99 mg/dL Final  1/63/8466 599* 70 - 99 mg/dL Final    #Acute on chronic combined CHF - Weight is actually stable from 4/1 (390 > 388). She is status post Lasix 60 mg IV x1. We have restarted home diuretics. Repeat CXR this morning shows vascular pattern within normal limits with no evidence of pulmonary edema or pleural effusion. Troponins were trended overnight and negative x3. Repeat EKG shows sinus tachycardia to 103, otherwise stable from admission. - Cardiac monitoring  - Daily ASA  - Continue home Coreg 18.75 mg twice a day  - Continue home hydralazine 50 mg 3 times a day  - Continue home Imdur 90 mg daily  - Continue home Aldactone 25 mg daily  - Continue home torsemide 100 mg twice a day  - Continue home metolazone 5 mg once per day on Sunday, Tuesday, Thursday, Saturday  - Daily weights > 388 - I/Os > not recorded - Norco 10-325 q6h prn pain  - Continue to monitor BMP and restart K supplementation as needed  #Tachycardia - HR 116s on my exam, but she was sitting on the commode. Review of tele shows HE 90-low  100s overnight. Could be 2/2 diuresis. No systemic signs of infection, afebrile and without leukocytosis. - Continue to monitor  #Nonproductive cough - CXR not suggestive of pneumonia. No leukocytosis or fever. No phlegm. - Mucinex BID - Continue to monitor  #Foul-smelling urine - UA unremarkable yesterday but will repeat.  Urinalysis    Component Value Date/Time   COLORURINE COLORLESS 08/19/2013 1404   APPEARANCEUR CLOUDY* 08/19/2013 1404   LABSPEC 1.016 08/19/2013 1404   PHURINE 5.0 08/19/2013 1404   GLUCOSEU >1000* 08/19/2013 1404   HGBUR NEGATIVE 08/19/2013 1404   BILIRUBINUR NEGATIVE 08/19/2013 1404   KETONESUR NEGATIVE 08/19/2013 1404   PROTEINUR NEGATIVE 08/19/2013 1404   UROBILINOGEN 0.2 08/19/2013 1404   NITRITE NEGATIVE 08/19/2013 1404   LEUKOCYTESUR NEGATIVE 08/19/2013 1404    #Hand cramping - No complaints of this this morning. K is wnl this morning. Mag 2.3. - Continue to monitor BMP and restart K supplementation as needed  #HTN - Normotensive currently. Continue home medications as above.   #CKD 3 - Cr is at her baseline of 1.4. - Continue to monitor   Creatinine, Ser  Date Value Ref Range Status  08/20/2013 1.44* 0.50 - 1.10 mg/dL Final  07/19/7015 7.93* 9.03 - 1.10 mg/dL Final  0/0/9233 0.07* 6.22 - 1.10 mg/dL Final  10/16/3352 5.62* 5.63 - 1.10 mg/dL Final  12/22/3732 2.87* 6.81 - 1.10 mg/dL Final    #Depression - Continue home amitriptyline 150 mg daily   #History of microcytic anemia - Baseline hemoglobin is 10-11. Stable on admission. - Continue iron supplementation with ferrous sulfate 325 mg twice a day   #GERD - Continue PPI, Protonix 40 mg daily.  #Constipation - Continue home Colace 200 mg daily, MiraLAX 17 g daily as needed.   #DVT PPX - Patient has a history of bilateral femoral DVTs, continue home Xarelto.   Dispo: Disposition is deferred at this time, awaiting improvement of current medical problems.  Anticipated discharge in approximately 1-3 day(s).   The  patient does have a current PCP Ula Lingo Montey Hora, MD) and does need an [facility] hospital follow-up appointment after discharge.  The patient does not have transportation limitations that hinder transportation to clinic  appointments.  .Services Needed at time of discharge: Y = Yes, Blank = No PT:   OT:   RN:   Equipment:   Other:     LOS: 1 day   Vivi Barrack, MD 08/20/2013, 8:12 AM  Vivi Barrack, MD  [email or web address] Pager # [phone] Office # [phone]

## 2013-08-20 NOTE — Plan of Care (Signed)
Problem: Food- and Nutrition-Related Knowledge Deficit (NB-1.1) Goal: Nutrition education Formal process to instruct or train a patient/client in a skill or to impart knowledge to help patients/clients voluntarily manage or modify food choices and eating behavior to maintain or improve health. Outcome: Completed/Met Date Met:  08/20/13  RD consulted for nutrition education regarding diabetes.  Pt recently seen at [facility] by RD for evaluation of gastric sleeve. Per pt RD went over healthy eating but pt has not made any changes yet. Pt with newly dx DM, awaiting most recent HgbA1C. Pt somewhat distracted during education, pt has a cough.  Reviewed DM basics, avoiding sugar-sweetened beverages, meal spacing and balance. Encouraged more vegetable intake.  Recommended pt follow up with RD at [facility].     Lab Results  Component Value Date    HGBA1C 6.7 09/24/2012    RD provided "Carbohydrate Counting for People with Diabetes" handout from the Academy of Nutrition and Dietetics. Discussed different food groups and their effects on blood sugar, emphasizing carbohydrate-containing foods. Provided list of carbohydrates and recommended serving sizes of common foods.  Discussed importance of controlled and consistent carbohydrate intake throughout the day. Provided examples of ways to balance meals/snacks and encouraged intake of high-fiber, whole grain complex carbohydrates. Teach back method used.  Expect poor-fair compliance.  Body mass index is 62.8 kg/(m^2). Pt meets criteria for morbid obesity based on current BMI.  Current diet order is CHO Modified, patient is consuming approximately 100% of meals at this time. Labs and medications reviewed. No further nutrition interventions warranted at this time. RD contact information provided. If additional nutrition issues arise, please re-consult RD.    RD, LDN, CNSC [phone] Pager [phone] After Hours Pager         

## 2013-08-20 NOTE — Progress Notes (Addendum)
Inpatient Diabetes Program Recommendations  AACE/ADA: New Consensus Statement on Inpatient Glycemic Control (2013)  Target Ranges:  Prepandial:   less than 140 mg/dL      Peak postprandial:   less than 180 mg/dL (1-2 hours)      Critically ill patients:  140 - 180 mg/dL   Reason for Visit: MD consult, New diagnosis of diabetes  Diabetes history: No previous diagnosis of diabetes known to patient.  Had A1C of 6.7 one year ago. Outpatient Diabetes medications: None Current orders for Inpatient glycemic control: Lantus 10 units at HS, Sensitive Novolog correction tid  Note:  Patient receptive to my visit.  Note that patient is pending gastric sleeve surgery.  Doesn't know when she will have surgery-- has to meet with the dietitian at [facility] three more times (saw dietitian 08/14/13) and see the psychiatrist prior to having surgery scheduled.  Advised her to be sure dietitian is aware of her new diagnosis of diabetes at her next visit.  Patient is followed at the Outpatient Clinic.  Has Home Health visiting weekly to change a Elveria Rising and generally assess her condition.  Elveria Rising is reportedly being used to treat blisters on her lower leg.)  Typically Outpatient Services are not covered while patient is homebound with Home Health-- but since patient is for gastric sleeve surgery and the outpatient dietitian visits are required, she is getting both types of service.  Patient will need to receive OP follow-up diabetes education from whatever source is most appropriate-- either [facility], or CDE with the Outpatient Clinic.  Home Health Nurse can also provide education and reinforcement of education as indicated.  A1C is pending.  Patient states MD told her that she would likely need insulin at discharge.  Nursing staff has begun insulin instruction.  Showed patient the insulin pen and she prefers to use pen instead of vial and syringe.  Will need glucose meter at home.  Addressed various questions patient has  regarding diagnosis of diabetes.  Patient's primary concern is regarding how she developed it.  Specifically asked if it was because of her weight.  Told her that her weight may have contributed to it and discussed other factors-- but stressed that whatever the cause, she needs to focus on controlling it from now on instead of having any guilt associated with the cause.  Suggested patient view the [employer] Education [employer] video offerings about diabetes.  Tried to access the network without success.  Reported issue to person in charge of the network and she is attempting to resolve problem-- issue had already been reported to her.  At discharge patient will need the following: Rx for whatever insulin(s) prescribed-- Patient prefers pens, so will need Rx for insulin pens- 1 box Lantus Solostar pens, 1 box Novolog Flexpens if patient prescribed correction or meal coverage, adequate number of pen needles (come in box of 100)  Glucose meter (Pt has Medicare and Medicaid so AccuChek meter, strips, and lancets are preferred)  OP diabetes education follow-up either with Outpatient Clinic CDE or [facility]  Will follow-up tomorrow.  Thank you for the referral.  Shadiyah Wernli S. Elsie Lincoln, RN, MSN, CDE Inpatient Diabetes Program, team pager [phone]  Addendum:  A1C is pending.  Patient would likely benefit from some form of oral agent in her diabetes treatment plan as well.

## 2013-08-20 NOTE — H&P (Signed)
Internal Medicine Attending Admission Note Date: 08/20/2013  Patient name: [name] Medical record number: [id number] Date of birth: [birth year] Age: 53 y.o. Gender: female  I saw and evaluated the patient. I reviewed the resident's note and I agree with the resident's findings and plan as documented in the resident's note, with the following additional comments.  Chief Complaint(s): Elevated blood sugar; polyuria and polydipsia  History - key components related to admission: Patient is a 53 year old woman with history of congestive heart failure status post AICD, severe mitral regurgitation status post mitral valve replacement, DVT, GERD, hypertension, and other problems as outlined in the medical history admitted with recent polyuria/polydipsia and markedly elevated blood sugar noted by home health nurse.  Patient also reports recent weight gain.  She says that she has been compliant with her diuretic medications.  She denies any known prior diagnosis of diabetes mellitus or past treatment.  Physical Exam - key components related to admission:  Filed Vitals:   08/19/13 1647 08/19/13 1922 08/19/13 2300 08/20/13 0404  BP:  103/75 110/87 118/97  Pulse: 95 99 102 101  Temp:   97.7 F (36.5 C) 98.7 F (37.1 C)  TempSrc:   Oral Oral  Resp:  20 18 18   Height:  5\' 6"  (1.676 m)    Weight:  388 lb 14.4 oz (176.404 kg) 388 lb 14.3 oz (176.4 kg)   SpO2: 97% 97% 97% 98%    General: Alert, no distress Lungs: Distant breath sounds Heart: Regular; distant; no obvious extra sounds or murmurs Abdomen: Bowel sounds present, soft, nontender Extremities: 3+ bilateral pitting edema  Lab results:   Basic Metabolic Panel:  Recent Labs  1832 08/19/13 2126 08/20/13 0557  NA 140 138 138  K 4.1 4.1 3.9  CL 95* 95* 95*  CO2 27 28 29   GLUCOSE 267* 204* 331*  BUN 14 15 17   CREATININE 1.39* 1.46* 1.44*  CALCIUM 10.0 9.5 9.0  MG 2.3  --   --     CBC:  Recent Labs   08/19/13 1340  WBC 4.4  HGB 11.1*  HCT 34.6*  MCV 79.2  PLT 262    Recent Labs  08/19/13 1340  NEUTROABS 3.0  LYMPHSABS 0.9  MONOABS 0.4  EOSABS 0.2  BASOSABS 0.0    Cardiac Enzymes:  Recent Labs  08/19/13 2126 08/20/13 0156 08/20/13 0557  TROPONINI <0.30 <0.30 <0.30    BNP:  Recent Labs  08/19/13 1340  PROBNP 559.6*     CBG:  Recent Labs  08/19/13 1957 08/19/13 2111 08/19/13 2249 08/20/13 0224 08/20/13 0639 08/20/13 0851  GLUCAP 206* 200* 251* 296* 318* 331*     Urinalysis    Component Value Date/Time   COLORURINE COLORLESS 08/19/2013 1404   APPEARANCEUR CLOUDY* 08/19/2013 1404   LABSPEC 1.016 08/19/2013 1404   PHURINE 5.0 08/19/2013 1404   GLUCOSEU >1000* 08/19/2013 1404   HGBUR NEGATIVE 08/19/2013 1404   BILIRUBINUR NEGATIVE 08/19/2013 1404   KETONESUR NEGATIVE 08/19/2013 1404   PROTEINUR NEGATIVE 08/19/2013 1404   UROBILINOGEN 0.2 08/19/2013 1404   NITRITE NEGATIVE 08/19/2013 1404   LEUKOCYTESUR NEGATIVE 08/19/2013 1404    Urine microscopic:  Recent Labs  08/19/13 1404  EPIU FEW*  WBCU 0-2  BACTERIA RARE    Hemoglobin A1C  Date Value Ref Range Status  09/24/2012 6.7   Final     ABG    Component Value Date/Time   PHART 7.471* 08/19/2013 1354   PCO2ART 41.2 08/19/2013 1354   PO2ART 97.0  08/19/2013 1354   HCO3 30.1* 08/19/2013 1354   TCO2 31 08/19/2013 1354   ACIDBASEDEF 3.0* 12/18/2008 2135   O2SAT 98.0 08/19/2013 1354     Imaging results:  Dg Chest 2 View  08/20/2013   CLINICAL DATA:  Pulmonary edema, complaining of dry cough  EXAM: CHEST  2 VIEW  COMPARISON:  DG CHEST 1V PORT dated 08/19/2013; DG CHEST 2 VIEW dated 07/19/2013; DG CHEST 2 VIEW dated 09/14/2012  FINDINGS: Stable cardiac pacer with mild to moderate cardiac enlargement. Replacement cardiac valve noted. Vascular pattern within normal limits with no evidence of pulmonary edema or pleural effusion. Triangular focus of opacity based against the lateral pleura in the right mid lung zone. This is in an  area of prior scarring or discoid atelectasis.  IMPRESSION: Mild lateral right mid lung zone likely peripheral atelectasis in combination with scarring. .   Electronically Signed   By: Esperanza Heir M.D.   On: 08/20/2013 07:58   Dg Chest Portable 1 View  08/19/2013   CLINICAL DATA:  Hyperglycemia.  EXAM: PORTABLE CHEST - 1 VIEW  COMPARISON:  07/19/2013.  FINDINGS: Examination limited by portable technique and patient's habitus. This particularly limits evaluation of the right thorax.  AICD is in place. Cardiomegaly. Central pulmonary vascular prominence.  No gross pneumothorax.  No segmental consolidation.  The patient would eventually benefit from two view chest.  IMPRESSION: Examination limited by portable technique and patient's habitus. This particularly limits evaluation of the right thorax.  AICD is in place. Cardiomegaly. Central pulmonary vascular prominence.  The patient would eventually benefit from two view chest.   Electronically Signed   By: Bridgett Larsson M.D.   On: 08/19/2013 14:46    Other results: EKG: Sinus rhythm; nonspecific T abnormalities, diffuse leads; prolonged QT interval  Assessment & Plan by Problem:  1.  Severe hyperglycemia in patient with new diagnosis of diabetes mellitus.  Blood sugar was initially controlled with IV insulin, with transition to Lantus and sliding scale insulin.  Plan is to follow CBGs and adjust Lantus; diabetes education; check hemoglobin A1c.  2.  Congestive heart failure.  Patient has significant bilateral leg edema which she says is chronic; her 2 view chest x-ray does not show evidence of pulmonary edema.  Given her leg edema, she would benefit from additional diuresis as inpatient with close attention to her blood pressure, electrolytes, and renal function.  Plan is diurese; follow in/outs, electrolytes, and renal function.    3.  Obstructive sleep apnea.  Patient is on home CPAP; need to confirm settings and continue as inpatient.  4.  Other  problems and plans as per the resident physician's note.

## 2013-08-20 NOTE — Evaluation (Signed)
Physical Therapy Evaluation Patient Details Name: [name] MRN: [id number] DOB: 01-Oct-1960 Today's Date: 08/20/2013   History of Present Illness  Patient is a 53 year old woman with history of congestive heart failure status post AICD, severe mitral regurgitation status post mitral valve replacement, DVT, GERD, hypertension, and other problems as outlined in the medical history admitted with recent polyuria/polydipsia and markedly elevated blood sugar noted by home health nurse.  Clinical Impression  Patient presents with limited mobility prior to admission with left knee arthritis and pain as well as LE edema now uncontrolled without wraps or compression stockings.  Patient interested in outpatient PT to maximize mobility and strength for undertaking surgery for gastric bypass to then hopefully have TKA.    Follow Up Recommendations Outpatient PT    Equipment Recommendations  None recommended by PT    Recommendations for Other Services       Precautions / Restrictions Precautions Precautions: Fall      Mobility  Bed Mobility Overal bed mobility: Modified Independent                Transfers Overall transfer level: Needs assistance   Transfers: Sit to/from Stand Sit to Stand: Min guard;Supervision         General transfer comment: from bed with momentum strategy  Ambulation/Gait Ambulation/Gait assistance: Supervision Ambulation Distance (Feet): 70 Feet Assistive device: Rolling walker (2 wheeled) Gait Pattern/deviations: Step-through pattern;Trunk flexed;Wide base of support;Decreased stride length;Antalgic     General Gait Details: keeps left leg extendend during gait; reports back pain and left knee pain  Stairs            Wheelchair Mobility    Modified Rankin (Stroke Patients Only)       Balance Overall balance assessment: History of Falls;Needs assistance           Standing balance-Leahy Scale: Poor Standing balance comment:  UE support needed for balance                             Pertinent Vitals/Pain 7/10 left knee and back with ambulation    Home Living Family/patient expects to be discharged to:: Private residence Living Arrangements: Children Available Help at Discharge: Family Type of Home: House Home Access: Level entry     Home Layout: One level Home Equipment: Environmental consultant - 4 wheels;Wheelchair - [employer];Shower seat Additional Comments: daughter works third shift    Prior Function Level of Independence: Needs assistance      ADL's / Homemaking Assistance Needed: occasional help with meals (grandaughter) or shower        Hand Dominance        Extremity/Trunk Assessment               Lower Extremity Assessment: RLE deficits/detail;LLE deficits/detail RLE Deficits / Details: AROM limited due to body habitus and arthritic changes, strength grossly 4/5 LLE Deficits / Details: AROM limited due to body habitus and arthritic changes, strength grossly 3-/5     Communication   Communication: No difficulties  Cognition Arousal/Alertness: Awake/alert Behavior During Therapy: WFL for tasks assessed/performed Overall Cognitive Status: Within Functional Limits for tasks assessed                      General Comments      Exercises        Assessment/Plan    PT Assessment Patient needs continued PT services  PT Diagnosis Difficulty walking;Abnormality of gait  PT Problem List Decreased activity tolerance;Decreased balance;Pain;Decreased safety awareness;Obesity;Decreased mobility  PT Treatment Interventions DME instruction;Gait training;Functional mobility training;Therapeutic activities;Patient/family education;Balance training;Therapeutic exercise;Modalities   PT Goals (Current goals can be found in the Care Plan section) Acute Rehab PT Goals Patient Stated Goal: To get stronger for bariatric surgery, then TKA PT Goal Formulation: With  patient Time For Goal Achievement: 09/03/13 Potential to Achieve Goals: Good    Frequency Min 3X/week   Barriers to discharge        Co-evaluation               End of Session   Activity Tolerance: Patient tolerated treatment well Patient left: in bed;with call bell/phone within reach           Time: 1502-1530 PT Time Calculation (min): 28 min   Charges:   PT Evaluation $Initial PT Evaluation Tier I: 1 Procedure PT Treatments $Gait Training: 8-22 mins   PT G Codes:          Jozelyn Kuwahara,CYNDI 09-04-2013, 4:54 PM Sheran Lawless, PT [phone] 09/04/13

## 2013-08-20 NOTE — Care Management Note (Addendum)
    Page 1 of 2   08/22/2013     11:50:06 AM   CARE MANAGEMENT NOTE 08/22/2013  Patient:  [name], [name]   Account Number:  [id number]  Date Initiated:  08/20/2013  Documentation initiated by:  HUTCHINSON,CRYSTAL  Subjective/Objective Assessment:   Admitted with acute CHF     Action/Plan:   CM to follow for dispositon needs   Anticipated DC Date:  08/21/2013   Anticipated DC Plan:  HOME W HOME HEALTH SERVICES      DC Planning Services  CM consult      [facility] Choice  Resumption Of Svcs/PTA Provider   Choice offered to / List presented to:          [facility] arranged  HH-1 RN  HH-10 DISEASE MANAGEMENT  HH-2 PT  HH-4 NURSE'S AIDE      HH agency  [facility]   Status of service:  Completed, signed off Medicare Important Message given?   (If response is "NO", the following Medicare IM given date fields will be blank) Date Medicare IM given:   Date Additional Medicare IM given:    Discharge Disposition:  HOME W HOME HEALTH SERVICES  Per UR Regulation:  Reviewed for med. 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 Nolberto Hanlon:  RN 1x/week active prior to admission (with [facility]) Home DME:  Walker, w/c, shower chair Medications:  MCD coverage copays running $1-2. Pharmacy:  [facility] [place] Patient completed home PT services and states no other recomendations were made. PT Evaluation ordered for possible need for continued OP PT  d/t Obesity, DM and other co-morbidities. Dispostion Plan: Resume HHS ([facility] / Lupita Leash notified) PT RECS:  Pending Disease MGMT:  New DM Medication Reconcilliation and MGMT Compliance with Disease MGMT, Appts, and MED MGMT  Message sent to attending MD: Dr. Meredith Pel, Please see document in chart for order for Blood Glucose Meter. Patient needs to take to her Pharmacy as an order to improve the chance of coverage. THXs, Crystal Hutchinson RN, BSN, MSHL, CCM 08/20/2013 Crystal Hutchinson RN, BSN, [place], CCM 08/20/2012

## 2013-08-20 NOTE — Progress Notes (Signed)
Pt placed on Auto CPAP via nasal mask, pt tolerating well at this time, RT to monitor and assess as needed.

## 2013-08-20 NOTE — Discharge Instructions (Addendum)
It was a pleasure taking care of you. - Please follow up in the [facility] as scheduled above. You will meet with the doctor as well as Lupita Leash, our diabetes educator. - We have provided a paper prescription for a glucose meter and supplies. Please make sure you are given this prior to discharge. It is located in your chart. Please check your sugars three times per day, before meals and at bedtime. This will help [place] to adjust your insulin at your appointment next week. - Please use your Lantus pen every night to administer 35 units of insulin. I have prescribed the pens and needles, these are available at your pharmacy. - Please read the attached information on diet changes and exercise. These two things, along with weight loss, can help control your diabetes tremendously.  - Please continue your antibiotic for UTI for another 1.5 days (3 doses). - We have arranged for your home health nurse to continue to come by and check on you. - We have also arranged for you to get outpatient physical therapy to help increase your strength. - If you develop worsening shortness of breath, fever, chills, pain, please call the clinic or return to the ED. Blood Glucose Monitoring, Adult Monitoring your blood glucose (also know as blood sugar) helps you to manage your diabetes. It also helps you and your health care provider monitor your diabetes and determine how well your treatment plan is working. WHY SHOULD YOU MONITOR YOUR BLOOD GLUCOSE?  It can help you understand how food, exercise, and medicine affect your blood glucose.  It allows you to know what your blood glucose is at any given moment. You can quickly tell if you are having low blood glucose (hypoglycemia) or high blood glucose (hyperglycemia).  It can help you and your health care provider know how to adjust your medicines.  It can help you understand how to manage an illness or adjust medicine for exercise. WHEN SHOULD YOU TEST? Your health care provider  will help you decide how often you should check your blood glucose. This may depend on the type of diabetes you have, your diabetes control, or the types of medicines you are taking. Be sure to write down all of your blood glucose readings so that this information can be reviewed with your health care provider. See below for examples of testing times that your health care provider may suggest. Type 1 Diabetes  Test 4 times a day if you are in good control, using an insulin pump, or perform multiple daily injections.  If your diabetes is not well-controlled or if you are sick, you may need to monitor more often.  It is a good idea to also monitor:  Before and after exercise.  Between meals and 2 hours after a meal.  Occasionally between 2:00 to 3:00 am. Type 2 Diabetes  It can vary with each person, but generally, if you are on insulin, test 4 times a day.  If you take medicines by mouth (orally), test 2 times a day.  If you are on a controlled diet, test once a day.  If your diabetes is not well controlled or if you are sick, you may need to monitor more often. HOW TO MONITOR YOUR BLOOD GLUCOSE Supplies Needed  Blood glucose meter.  Test strips for your meter. Each meter has its own strips. You must use the strips that go with your own meter.  A pricking needle (lancet).  A device that holds the lancet (lancing device).  A journal or log book to write down your results. Procedure  Wash your hands with soap and water. Alcohol is not preferred.  Prick the side of your finger (not the tip) with the lancet.  Gently milk the finger until a small drop of blood appears.  Follow the instructions that come with your meter for inserting the test strip, applying blood to the strip, and using your blood glucose meter. Other Areas to Get Blood for Testing Some meters allow you to use other areas of your body (other than your finger) to test your blood. These areas are called  alternative sites. The most common alternative sites are:  The forearm.  The thigh.  The back area of the lower leg.  The palm of the hand. The blood flow in these areas is slower. Therefore, the blood glucose values you get may be delayed, and the numbers are different from what you would get from your fingers. Do not use alternative sites if you think you are having hypoglycemia. Your reading will not be accurate. Always use a finger if you are having hypoglycemia. Also, if you cannot feel your lows (hypoglycemia unawareness), always use your fingers for your blood glucose checks. ADDITIONAL TIPS FOR GLUCOSE MONITORING  Do not reuse lancets.  Always carry your supplies with you.  All blood glucose meters have a 24-hour "hotline" number to call if you have questions or need help.  Adjust (calibrate) your blood glucose meter with a control solution after finishing a few boxes of strips. BLOOD GLUCOSE RECORD KEEPING It is a good idea to keep a daily record or log of your blood glucose readings. Most glucose meters, if not all, keep your glucose records stored in the meter. Some meters come with the ability to download your records to your home computer. Keeping a record of your blood glucose readings is especially helpful if you are wanting to look for patterns. Make notes to go along with the blood glucose readings because you might forget what happened at that exact time. Keeping good records helps you and your health care provider to work together to achieve good diabetes management.  Document Released: 05/05/2003 Document Revised: 01/02/2013 Document Reviewed: 09/24/2012 [facility] Patient Information 2014 [place], [place].

## 2013-08-20 NOTE — Progress Notes (Signed)
UR completed Lucilia Yanni K. Emrys Mceachron, RN, BSN, MSHL, CCM  08/20/2013 1:57 PM

## 2013-08-21 DIAGNOSIS — R05 Cough: Secondary | ICD-10-CM

## 2013-08-21 DIAGNOSIS — E876 Hypokalemia: Secondary | ICD-10-CM

## 2013-08-21 DIAGNOSIS — G4733 Obstructive sleep apnea (adult) (pediatric): Secondary | ICD-10-CM

## 2013-08-21 DIAGNOSIS — R609 Edema, unspecified: Secondary | ICD-10-CM

## 2013-08-21 DIAGNOSIS — R059 Cough, unspecified: Secondary | ICD-10-CM

## 2013-08-21 DIAGNOSIS — IMO0001 Reserved for inherently not codable concepts without codable children: Secondary | ICD-10-CM

## 2013-08-21 DIAGNOSIS — E1165 Type 2 diabetes mellitus with hyperglycemia: Secondary | ICD-10-CM

## 2013-08-21 LAB — GLUCOSE, RANDOM: Glucose, Bld: 341 mg/dL — ABNORMAL HIGH (ref 70–99)

## 2013-08-21 LAB — BASIC METABOLIC PANEL
BUN: 22 mg/dL (ref 6–23)
CALCIUM: 9.2 mg/dL (ref 8.4–10.5)
CO2: 28 mEq/L (ref 19–32)
CREATININE: 1.66 mg/dL — AB (ref 0.50–1.10)
Chloride: 87 mEq/L — ABNORMAL LOW (ref 96–112)
GFR calc non Af Amer: 34 mL/min — ABNORMAL LOW (ref >90)
GFR, EST AFRICAN AMERICAN: 40 mL/min — AB (ref >90)
Glucose, Bld: 449 mg/dL — ABNORMAL HIGH (ref 70–99)
Potassium: 3.5 mEq/L — ABNORMAL LOW (ref 3.7–5.3)
Sodium: 132 mEq/L — ABNORMAL LOW (ref 137–147)

## 2013-08-21 LAB — CBC
HCT: 35.8 % — ABNORMAL LOW (ref 36.0–46.0)
Hemoglobin: 11.6 g/dL — ABNORMAL LOW (ref 12.0–15.0)
MCH: 25.2 pg — ABNORMAL LOW (ref 26.0–34.0)
MCHC: 32.4 g/dL (ref 30.0–36.0)
MCV: 77.8 fL — ABNORMAL LOW (ref 78.0–100.0)
Platelets: 239 10*3/uL (ref 150–400)
RBC: 4.6 MIL/uL (ref 3.87–5.11)
RDW: 19.2 % — AB (ref 11.5–15.5)
WBC: 4.3 10*3/uL (ref 4.0–10.5)

## 2013-08-21 LAB — GLUCOSE, CAPILLARY
Glucose-Capillary: 429 mg/dL — ABNORMAL HIGH (ref 70–99)
Glucose-Capillary: 255 mg/dL — ABNORMAL HIGH (ref 70–99)
Glucose-Capillary: 363 mg/dL — ABNORMAL HIGH (ref 70–99)
Glucose-Capillary: 401 mg/dL — ABNORMAL HIGH (ref 70–99)

## 2013-08-21 MED ORDER — INSULIN GLARGINE 100 UNIT/ML ~~LOC~~ SOLN
30.0000 [IU] | Freq: Every day | SUBCUTANEOUS | Status: DC
  Administered 2013-08-21: 30 [IU] via SUBCUTANEOUS
  Filled 2013-08-21: qty 0.3

## 2013-08-21 MED ORDER — CIPROFLOXACIN HCL 250 MG PO TABS
250.0000 mg | ORAL_TABLET | Freq: Two times a day (BID) | ORAL | Status: DC
  Administered 2013-08-21 – 2013-08-22 (×3): 250 mg via ORAL
  Filled 2013-08-21 (×7): qty 1

## 2013-08-21 MED ORDER — POTASSIUM CHLORIDE CRYS ER 20 MEQ PO TBCR
40.0000 meq | EXTENDED_RELEASE_TABLET | Freq: Once | ORAL | Status: AC
  Administered 2013-08-21: 09:00:00 40 meq via ORAL
  Filled 2013-08-21: qty 2

## 2013-08-21 MED ORDER — BENZONATATE 100 MG PO CAPS
100.0000 mg | ORAL_CAPSULE | Freq: Two times a day (BID) | ORAL | Status: DC | PRN
  Administered 2013-08-21 – 2013-08-22 (×2): 100 mg via ORAL
  Filled 2013-08-21 (×4): qty 1

## 2013-08-21 NOTE — Consult Note (Signed)
WOC wound consult note Reason for Consult: Consult requested for BLE.  Pt has generalized edema and scattered areas of blistering which have not evolved into open wounds at this time.No odor or drainage. Pt states this is a chronic problem and she wears Una boots at home which are applied by home health and changed weekly. Dressing procedure/placement/frequency: Ortho tech notified to apply bilat Una boots and coban and change Q WED.  Pt can resume follow-up with home health to continue [facility] boot application after discharge. Please re-consult if further assistance is needed.  Thank-you,  Cammie Mcgee MSN, RN, CWOCN, [place], CNS [phone]

## 2013-08-21 NOTE — Progress Notes (Signed)
Subjective: Patient seen and examined at the bedside this morning. She had a cough last night and has some chest discomfort from this, trigger by cough, not present at rest. Otherwise denies SOB, nausea, vomiting. She talked to the diabetes educator yesterday and watched the video. She is a little nervous about the thought of giving herself insulin at home. Still with foul smelling urine and polyuria, but no dysuria.  Objective: Vital signs in last 24 hours: Filed Vitals:   08/20/13 1947 08/20/13 2338 08/21/13 0009 08/21/13 0424  BP: 105/43 137/57  96/39  Pulse: 87 99  101  Temp: 97.5 F (36.4 C) 97.5 F (36.4 C)  97.4 F (36.3 C)  TempSrc: Oral Oral  Oral  Resp: 15 15  17   Height:      Weight:   387 lb 9.1 oz (175.8 kg)   SpO2: 99% 92%  93%   Weight change: -1 lb 5.3 oz (-0.604 kg)  Intake/Output Summary (Last 24 hours) at 08/21/13 0745 Last data filed at 08/21/13 10/21/13  Gross per 24 hour  Intake   1740 ml  Output   2050 ml  Net   -310 ml    Physical Exam  Constitutional: She is oriented to person, place, and time and well-developed, well-nourished, and in no distress.  Lying in bed watching TV. HENT:  Head: Normocephalic and atraumatic.  Eyes: Conjunctivae and EOM are normal. Pupils are equal, round, and reactive to light.  Neck: Normal range of motion. Neck supple.  Cardiovascular: Regular rate, regular rhythm and normal heart sounds. Exam reveals no gallop and no friction rub.  No murmur heard. Exam limited by body habitus  Pulmonary/Chest: Effort normal. No respiratory distress. She has no wheezes. No rales today. She exhibits no tenderness. No coughing on my exam. Exam limited by body habitus  Abdominal: Soft. Bowel sounds are normal. She exhibits no distension. There is no tenderness.  Musculoskeletal: Normal range of motion. She exhibits edema (2 pitting edema to the knees bilaterally). She exhibits no tenderness.  Neurological: She is alert and oriented to  person, place, and time. No cranial nerve deficit. GCS score is 15.  Skin: Skin is warm and dry. There are blisters on her legs, they are not ulcerated, no skin breakdown. She says she was using an 4742 at home.   Psychiatric: Affect normal.   Lab Results: Basic Metabolic Panel:  Recent Labs Lab 08/19/13 1832  08/20/13 0557 08/21/13 0552  NA 140  < > 138 132*  K 4.1  < > 3.9 3.5*  CL 95*  < > 95* 87*  CO2 27  < > 29 28  GLUCOSE 267*  < > 331* 449*  BUN 14  < > 17 22  CREATININE 1.39*  < > 1.44* 1.66*  CALCIUM 10.0  < > 9.0 9.2  MG 2.3  --   --   --   < > = values in this interval not displayed. CBC:  Recent Labs Lab 08/19/13 1340 08/21/13 0552  WBC 4.4 4.3  NEUTROABS 3.0  --   HGB 11.1* 11.6*  HCT 34.6* 35.8*  MCV 79.2 77.8*  PLT 262 239   Cardiac Enzymes:  Recent Labs Lab 08/19/13 2126 08/20/13 0156 08/20/13 0557  TROPONINI <0.30 <0.30 <0.30   BNP:  Recent Labs Lab 08/19/13 1340  PROBNP 559.6*   CBG:  Recent Labs Lab 08/20/13 0224 08/20/13 0639 08/20/13 0851 08/20/13 1148 08/20/13 1701 08/20/13 1951  GLUCAP 296* 318* 331* 408* 248*  292*   Urinalysis:  Recent Labs Lab 08/19/13 1404 08/20/13 1229  COLORURINE COLORLESS YELLOW  LABSPEC 1.016 1.017  PHURINE 5.0 7.5  GLUCOSEU >1000* 500*  HGBUR NEGATIVE NEGATIVE  BILIRUBINUR NEGATIVE NEGATIVE  KETONESUR NEGATIVE NEGATIVE  PROTEINUR NEGATIVE NEGATIVE  UROBILINOGEN 0.2 0.2  NITRITE NEGATIVE NEGATIVE  LEUKOCYTESUR NEGATIVE MODERATE*    Micro Results: No results found for this or any previous visit (from the past 240 hour(s)). Studies/Results: Dg Chest 2 View  08/20/2013   CLINICAL DATA:  Pulmonary edema, complaining of dry cough  EXAM: CHEST  2 VIEW  COMPARISON:  DG CHEST 1V PORT dated 08/19/2013; DG CHEST 2 VIEW dated 07/19/2013; DG CHEST 2 VIEW dated 09/14/2012  FINDINGS: Stable cardiac pacer with mild to moderate cardiac enlargement. Replacement cardiac valve noted. Vascular pattern  within normal limits with no evidence of pulmonary edema or pleural effusion. Triangular focus of opacity based against the lateral pleura in the right mid lung zone. This is in an area of prior scarring or discoid atelectasis.  IMPRESSION: Mild lateral right mid lung zone likely peripheral atelectasis in combination with scarring. .   Electronically Signed   By: Esperanza Heir M.D.   On: 08/20/2013 07:58   Dg Chest Portable 1 View  08/19/2013   CLINICAL DATA:  Hyperglycemia.  EXAM: PORTABLE CHEST - 1 VIEW  COMPARISON:  07/19/2013.  FINDINGS: Examination limited by portable technique and patient's habitus. This particularly limits evaluation of the right thorax.  AICD is in place. Cardiomegaly. Central pulmonary vascular prominence.  No gross pneumothorax.  No segmental consolidation.  The patient would eventually benefit from two view chest.  IMPRESSION: Examination limited by portable technique and patient's habitus. This particularly limits evaluation of the right thorax.  AICD is in place. Cardiomegaly. Central pulmonary vascular prominence.  The patient would eventually benefit from two view chest.   Electronically Signed   By: Bridgett Larsson M.D.   On: 08/19/2013 14:46   Medications: I have reviewed the patient's current medications. Scheduled Meds: . amitriptyline  150 mg Oral QHS  . aspirin EC  81 mg Oral Daily  . carvedilol  18.75 mg Oral BID WC  . dextromethorphan-guaiFENesin  1 tablet Oral BID  . docusate sodium  200 mg Oral Daily  . ferrous sulfate  325 mg Oral BID WC  . hydrALAZINE  50 mg Oral TID  . insulin aspart  0-9 Units Subcutaneous TID WC  . insulin glargine  25 Units Subcutaneous QHS  . isosorbide mononitrate  90 mg Oral Daily  . metolazone  5 mg Oral Once per day on Sun Tue Thu Sat  . pantoprazole  40 mg Oral Daily  . potassium chloride  40 mEq Oral Once  . Rivaroxaban  20 mg Oral q morning - 10a  . spironolactone  25 mg Oral Daily  . torsemide  100 mg Oral BID    Continuous Infusions:  PRN Meds:.HYDROcodone-acetaminophen, polyethylene glycol, promethazine, promethazine  Assessment/Plan: TENNYSON KIRSCH is a 53 y.o. 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   #Hyperglycemia in the setting of uncontrolled DM2 - A1C 13.0. CBGs still elevated overnight after increasing Lantus to 25 units QHS. - Appreciate diabetes coordinator recs, at discharge patient will need the following: 1) Rx for whatever insulin(s) prescribed-- Patient prefers  pens, so will need Rx for insulin pens- 1 box Lantus Solostar pens, 1 box Novolog Flexpens if patient prescribed correction or meal coverage, adequate number of pen needles (come in box of 100), 2) Glucose meter (Pt has Medicare and Medicaid so AccuChek meter, strips, and lancets are preferred), 3) OP diabetes education follow-up either with Outpatient Clinic [facility] or [facility] - Carb modified diet - SSI, sensitive - Lantus 30 units QHS - CBG AC and QHS  - Phenergan prn nausea - BMP in am  Glucose-Capillary  Date Value Ref Range Status  08/20/2013 292* 70 - 99 mg/dL Final  06/22/7822 235* 70 - 99 mg/dL Final  07/19/1441 154* 70 - 99 mg/dL Final  0/0/8676 195* 70 - 99 mg/dL Final  0/01/3266 124* 70 - 99 mg/dL Final  09/20/996 338* 70 - 99 mg/dL Final  06/20/537 767* 70 - 99 mg/dL Final  07/18/1935 902* 70 - 99 mg/dL Final  4/0/9735 329* 70 - 99 mg/dL Final  01/16/4267 341* 70 - 99 mg/dL Final    #Acute on chronic combined CHF - Weight is stable. She is status post Lasix 60 mg IV x2. We have restarted home diuretics. CXR 4/7 showed vascular pattern within normal limits with no evidence of pulmonary edema or pleural effusion. Troponins were trended negative x3. No EKG changes. - Cardiac monitoring  -  Daily ASA  - Continue home Coreg 18.75 mg twice a day  - Continue home hydralazine 50 mg 3 times a day  - Continue home Imdur 90 mg daily  - Continue home Aldactone 25 mg daily  - Continue home torsemide 100 mg twice a day  - Continue home metolazone 5 mg once per day on Sunday, Tuesday, Thursday, Saturday  - Daily weights > 388 > 387 - I/Os > UOP 2050cc yesterday - Norco 10-325 q6h prn pain  - Continue to monitor BMP  #Tachycardia, resolved - HR 90s. - Continue to monitor  #Nonproductive cough - CXR not suggestive of pneumonia. No leukocytosis or fever. No phlegm. Lung exam nonfocal. - Mucinex BID - Tessalon BID prn - Continue to monitor  #Foul-smelling urine - Still with smelly urine and polyuria. UA 4/6 as below, micro shows few squams, many bacteria, too numerous WBC. No prior urine cultures in system to guide treatment. She was treated empirically for a UTI with Cipro 250 bid x 3 days in 01/15. - Follow up urine culture - Treat empirically with Cipro 250 bid x 3 days  Urinalysis    Component Value Date/Time   COLORURINE YELLOW 08/20/2013 1229   APPEARANCEUR HAZY* 08/20/2013 1229   LABSPEC 1.017 08/20/2013 1229   PHURINE 7.5 08/20/2013 1229   GLUCOSEU 500* 08/20/2013 1229   HGBUR NEGATIVE 08/20/2013 1229   BILIRUBINUR NEGATIVE 08/20/2013 1229   KETONESUR NEGATIVE 08/20/2013 1229   PROTEINUR NEGATIVE 08/20/2013 1229   UROBILINOGEN 0.2 08/20/2013 1229   NITRITE NEGATIVE 08/20/2013 1229   LEUKOCYTESUR MODERATE* 08/20/2013 1229    #Hypokalemia - K low this am. Mag 2.3. - K-dur x1 - Continue to monitor  Potassium  Date Value Ref Range Status  08/21/2013 3.5* 3.7 - 5.3 mEq/L Final  08/20/2013 3.9  3.7 - 5.3 mEq/L Final  08/19/2013 4.1  3.7 - 5.3 mEq/L Final  08/19/2013 4.1  3.7 - 5.3 mEq/L Final  08/19/2013 5.3  3.7 - 5.3 mEq/L Final    #Lower extremity edema with skin changes - Chronic issue. Was using an Radio broadcast assistant at home. On Xarelto from chronic LE  DVTs. Blisters are new per patient. -  Wound care consult  #HTN - Normotensive currently. Continue home medications as above.   #CKD 3 - Cr is near her baseline of 1.4. - Continue to monitor   Creatinine, Ser  Date Value Ref Range Status  08/21/2013 1.66* 0.50 - 1.10 mg/dL Final  5/0/0938 1.82* 9.93 - 1.10 mg/dL Final  11/13/6965 8.93* 8.10 - 1.10 mg/dL Final  05/22/5100 5.85* 2.77 - 1.10 mg/dL Final  12/15/4233 3.61* 4.43 - 1.10 mg/dL Final    #OSA - CPAP QHS  #Depression - Continue home amitriptyline 150 mg daily   #History of microcytic anemia - Baseline hemoglobin is 10-11. Stable. - Continue iron supplementation with ferrous sulfate 325 mg twice a day   #GERD - Continue PPI, Protonix 40 mg daily.  #Constipation - Continue home Colace 200 mg daily, MiraLAX 17 g daily as needed.   #DVT PPX - Patient has a history of bilateral femoral DVTs, continue home Xarelto.   Dispo: Disposition is deferred at this time, awaiting improvement of current medical problems.  Anticipated discharge in approximately 1-3 day(s).   The patient does have a current PCP Ula Lingo Montey Hora, MD) and does need an [facility] hospital follow-up appointment after discharge.  The patient does not have transportation limitations that hinder transportation to clinic appointments.  .Services Needed at time of discharge: Y = Yes, Blank = No PT: Outpatient PT  OT:   RN: [facility] is actively providing the following services: RN  Equipment:   Other: Please see document in chart for order for Blood Glucose Meter. Patient needs to take to her Pharmacy as an order to improve the chance of coverage    LOS: 2 days   Vivi Barrack, MD 08/21/2013, 7:45 AM  Vivi Barrack, MD  [email or web address] Pager # [phone] Office # [phone]

## 2013-08-21 NOTE — Progress Notes (Signed)
Inpatient Diabetes Program Recommendations  AACE/ADA: New Consensus Statement on Inpatient Glycemic Control (2013)  Target Ranges:  Prepandial:   less than 140 mg/dL      Peak postprandial:   less than 180 mg/dL (1-2 hours)      Critically ill patients:  140 - 180 mg/dL   Reason for Visit: Follow-up regarding diabetes and use of insulin pens  Diabetes history: New onset type 2 Outpatient Diabetes medications: N/A Current orders for Inpatient glycemic control: Lantus increased to 30 units at HS, Sensitive Correction tid   Note:  Visited patient at bedside to review use of insulin pens again.  Did not remember much from yesterday's interaction.  Gave her a tool/booklet for her to take home that gives verbal step-by-step instructions by pressing a button.  Also gave her a booklet about Lantus insulin pen.  Needs much reinforcement of instruction.  Would be ideal if Home Health can reinforce use of insulin pens, and all aspects of diabetes self-care at home after discharge.  At some point will need OP Diabetes Education follow-up education-- either in the Outpatient Clinic with CDE or [facility], since she is already being seen there regarding gastric sleeve surgery.  Note patient's Lantus dosage has been increased.  May also benefit from addition of regularly scheduled Novolog as meal coverage-- perhaps 10 units Novolog tid with meals in addition to correction scale.  Shyloh Krinke S. Elsie Lincoln, RN, MSN, CDE, Inpatient Diabetes Coordinator, [phone] Thank you.

## 2013-08-21 NOTE — Progress Notes (Signed)
Orthopedic Tech Progress Note Patient Details:  [name] 06/04/1960 [id number]  Ortho Devices Type of Ortho Device: Unna boot Ortho Device/Splint Location: bilateral Ortho Device/Splint Interventions: Application   Arline Ketter 08/21/2013, 9:44 AM

## 2013-08-21 NOTE — Progress Notes (Signed)
Internal Medicine Attending  Date: 08/21/2013  Patient name: [name] Medical record number: [id number] Date of birth: 27-Oct-1960 Age: 53 y.o. Gender: female  I saw and evaluated the patient, and discussed her care on A.M rounds with housestaff.  I reviewed the resident's note by Dr. Claudell Kyle and I agree with the resident's findings and plans as documented in her note.

## 2013-08-22 ENCOUNTER — Telehealth: Payer: Self-pay | Admitting: Internal Medicine

## 2013-08-22 ENCOUNTER — Other Ambulatory Visit: Payer: Self-pay | Admitting: Internal Medicine

## 2013-08-22 DIAGNOSIS — E119 Type 2 diabetes mellitus without complications: Secondary | ICD-10-CM

## 2013-08-22 LAB — BASIC METABOLIC PANEL
BUN: 30 mg/dL — AB (ref 6–23)
CHLORIDE: 85 meq/L — AB (ref 96–112)
CO2: 30 mEq/L (ref 19–32)
Calcium: 8.6 mg/dL (ref 8.4–10.5)
Creatinine, Ser: 1.79 mg/dL — ABNORMAL HIGH (ref 0.50–1.10)
GFR calc Af Amer: 36 mL/min — ABNORMAL LOW (ref >90)
GFR calc non Af Amer: 31 mL/min — ABNORMAL LOW (ref >90)
GLUCOSE: 302 mg/dL — AB (ref 70–99)
POTASSIUM: 3.1 meq/L — AB (ref 3.7–5.3)
SODIUM: 131 meq/L — AB (ref 137–147)

## 2013-08-22 LAB — GLUCOSE, CAPILLARY
GLUCOSE-CAPILLARY: 214 mg/dL — AB (ref 70–99)
GLUCOSE-CAPILLARY: 315 mg/dL — AB (ref 70–99)
GLUCOSE-CAPILLARY: 322 mg/dL — AB (ref 70–99)

## 2013-08-22 MED ORDER — DM-GUAIFENESIN ER 30-600 MG PO TB12: 1.0000 | ORAL_TABLET | Freq: Two times a day (BID) | ORAL | Status: DC

## 2013-08-22 MED ORDER — CIPROFLOXACIN HCL 250 MG PO TABS: 250.0000 mg | ORAL_TABLET | Freq: Two times a day (BID) | ORAL | Status: AC

## 2013-08-22 MED ORDER — POTASSIUM CHLORIDE CRYS ER 20 MEQ PO TBCR
40.0000 meq | EXTENDED_RELEASE_TABLET | Freq: Two times a day (BID) | ORAL | Status: DC
  Administered 2013-08-22: 40 meq via ORAL

## 2013-08-22 MED ORDER — INSULIN DETEMIR 100 UNIT/ML FLEXPEN: 35.0000 [IU] | PEN_INJECTOR | Freq: Every day | SUBCUTANEOUS | Status: DC

## 2013-08-22 MED ORDER — INSULIN GLARGINE 100 UNIT/ML SOLOSTAR PEN: 35.0000 [IU] | PEN_INJECTOR | Freq: Every day | SUBCUTANEOUS | Status: DC

## 2013-08-22 MED ORDER — FREESTYLE SYSTEM KIT: 1.0000 | PACK | Freq: Three times a day (TID) | Status: DC

## 2013-08-22 MED ORDER — INSULIN GLARGINE 100 UNIT/ML ~~LOC~~ SOLN
35.0000 [IU] | Freq: Every day | SUBCUTANEOUS | Status: DC
  Filled 2013-08-22: qty 0.35

## 2013-08-22 MED ORDER — FREESTYLE SYSTEM KIT: 1.0000 | PACK | Status: DC | PRN

## 2013-08-22 MED ORDER — BENZONATATE 100 MG PO CAPS: 100.0000 mg | ORAL_CAPSULE | Freq: Two times a day (BID) | ORAL | Status: DC | PRN

## 2013-08-22 MED ORDER — FREESTYLE LANCETS MISC: 1.0000 | Freq: Three times a day (TID) | Status: DC

## 2013-08-22 MED ORDER — AMITRIPTYLINE HCL 150 MG PO TABS: 150.0000 mg | ORAL_TABLET | Freq: Every day | ORAL | Status: DC

## 2013-08-22 MED ORDER — INSULIN PEN NEEDLE 32G X 4 MM MISC: Status: DC

## 2013-08-22 MED ORDER — HYDROCODONE-ACETAMINOPHEN 10-325 MG PO TABS: 1.0000 | ORAL_TABLET | Freq: Four times a day (QID) | ORAL | Status: DC | PRN

## 2013-08-22 MED ORDER — GLUCOSE BLOOD VI STRP: 1.0000 | ORAL_STRIP | Freq: Three times a day (TID) | Status: DC

## 2013-08-22 MED ORDER — INSULIN ASPART 100 UNIT/ML ~~LOC~~ SOLN
10.0000 [IU] | Freq: Three times a day (TID) | SUBCUTANEOUS | Status: DC
  Administered 2013-08-22: 10 [IU] via SUBCUTANEOUS

## 2013-08-22 NOTE — Discharge Summary (Signed)
Name: [name] MRN: [id number] DOB: [birth year] 53 y.o. PCP: Jeralene Huff, MD  Date of Admission: 08/19/2013  1:11 PM Date of Discharge: 08/22/2013 Attending Physician: Axel Filler, MD  Discharge Diagnosis: Principal Problem:   Hyperglycemia in the setting of uncontrolled DM2    Mild acute on chronic diastolic heart failure 2/2 diet noncompliance Active Problems:   ANEMIA, IRON DEFICIENCY   Morbid obesity   Hyperglycemia   CKD (chronic kidney disease) stage 3, GFR 30-59 ml/min   Diabetes mellitus, type 2  Discharge Medications:   Medication List         amitriptyline 150 MG tablet  Commonly known as:  ELAVIL  Take 1 tablet (150 mg total) by mouth at bedtime.     benzonatate 100 MG capsule  Commonly known as:  TESSALON  Take 1 capsule (100 mg total) by mouth 2 (two) times daily as needed for cough.     carvedilol 12.5 MG tablet  Commonly known as:  COREG  Take 1.5 tablets (18.75 mg total) by mouth 2 (two) times daily with a meal.     ciprofloxacin 250 MG tablet  Commonly known as:  CIPRO  Take 1 tablet (250 mg total) by mouth 2 (two) times daily.     dextromethorphan-guaiFENesin 30-600 MG per 12 hr tablet  Commonly known as:  MUCINEX DM  Take 1 tablet by mouth 2 (two) times daily.     diclofenac sodium 1 % Gel  Commonly known as:  VOLTAREN  Apply 4 g topically 4 (four) times daily.     docusate sodium 100 MG capsule  Commonly known as:  COLACE  Take 200 mg by mouth daily.     Febuxostat 80 MG Tabs  Take 80 mg by mouth daily.     ferrous sulfate 325 (65 FE) MG tablet  Take 1 tablet (325 mg total) by mouth 2 (two) times daily with a meal.     hydrALAZINE 50 MG tablet  Commonly known as:  APRESOLINE  Take 1 tablet (50 mg total) by mouth 3 (three) times daily.     HYDROcodone-acetaminophen 10-325 MG per tablet  Commonly known as:  NORCO  Take 1 tablet by mouth every 6 (six) hours as needed for severe pain.     Insulin Glargine 100  UNIT/ML Solostar Pen  Commonly known as:  LANTUS  Inject 35 Units into the skin daily at 10 pm.     Insulin Pen Needle 32G X 4 MM Misc  Please use 1 needle at night to administer insulin via Lantus pen     isosorbide mononitrate 60 MG 24 hr tablet  Commonly known as:  IMDUR  Take 90 mg by mouth daily.     metolazone 5 MG tablet  Commonly known as:  ZAROXOLYN  Take 1 tablet (5 mg total) by mouth 4 (four) times a week. Monday,Wednesday,Friday and Saturday.  30 minutes before torsemide     omeprazole 40 MG capsule  Commonly known as:  PRILOSEC  Take 1 capsule (40 mg total) by mouth daily.     polyethylene glycol packet  Commonly known as:  MIRALAX / GLYCOLAX  Take 17 g by mouth daily as needed for moderate constipation.     potassium chloride SA 20 MEQ tablet  Commonly known as:  K-DUR,KLOR-CON  Take 40 mEq by mouth 2 (two) times daily.     spironolactone 25 MG tablet  Commonly known as:  ALDACTONE  Take 25 mg by mouth daily.  torsemide 100 MG tablet  Commonly known as:  DEMADEX  Take 1 tablet (100 mg total) by mouth 2 (two) times daily.     XARELTO 20 MG Tabs tablet  Generic drug:  Rivaroxaban  Take 20 mg by mouth every morning.        Disposition and follow-up:   [name] was discharged from [facility] in Stable condition.  At the hospital follow up visit please address:  1.  CBG control. I have asked her to check her sugars QID. She was given a script for the Lantus pens and was educated on using these here. We deferred starting meal coverage due to her hesitance and need for more education. She may need this. She may also benefit from oral meds (but Metformin would be contraindicated given her CKD and CHF). Please continue to work with her, close f/u was arranged in the [facility] and also with Butch Penny. Please also follow volume status (CHF).  2.  Labs / imaging needed at time of follow-up: BMP (K, Cr)  3.  Pending labs/ test needing  follow-up: Urine culture  Follow-up Appointments: Follow-up Information   Follow up with [place] to start within 24-48 hours of discharge)    Contact information:   [address] [place] [address] [phone]       Follow up with Cresenciano Genre, MD On 08/29/2013. ('@9' :45am. This is your primary care doctor.)    Specialty:  Internal Medicine   Contact information:   [address] [place] [address] [phone]       Follow up with [place], Butch Penny, [place] On 08/29/2013. ('@8' :30am. This is your diabetes educator.)    Specialty:  Financial controller information:   [place] [id number] [phone]       Discharge Instructions: Discharge Orders   Future Appointments Provider Department Dept Phone   08/29/2013 8:30 AM [place], [place] [facility] [place] [phone]   08/29/2013 9:45 AM Cresenciano Genre, MD [facility] [place] [phone]   09/09/2013 3:15 PM Jeralene Huff, MD [place] [phone]   09/11/2013 3:30 PM Kathee Delton, MD [facility] [phone]   09/17/2013 2:45 PM Lady Saucier, RD [facility] [phone]   10/23/2013 8:25 AM [facility] [place] Office [phone]   Future Orders Complete By Expires   Call MD for:  difficulty breathing, headache or visual disturbances  As directed    Call MD for:  persistant nausea and vomiting  As directed    Call MD for:  temperature >100.4  As directed    Diet - low sodium heart healthy  As directed    Diet Carb Modified  As directed    Increase activity slowly  As directed       Consultations:  Diabetes coordinator  Procedures Performed:  Dg Chest 2 View  08/20/2013   CLINICAL DATA:  Pulmonary edema, complaining of dry cough  EXAM: CHEST  2 VIEW  COMPARISON:  DG CHEST 1V  PORT dated 08/19/2013; DG CHEST 2 VIEW dated 07/19/2013; DG CHEST 2 VIEW dated 09/14/2012  FINDINGS: Stable cardiac pacer with mild to moderate cardiac enlargement. Replacement cardiac valve noted. Vascular pattern within normal limits with no evidence of pulmonary edema or pleural effusion. Triangular focus of opacity based against the lateral pleura in the  right mid lung zone. This is in an area of prior scarring or discoid atelectasis.  IMPRESSION: Mild lateral right mid lung zone likely peripheral atelectasis in combination with scarring. .   Electronically Signed   By: Skipper Cliche M.D.   On: 08/20/2013 07:58   Dg Chest Portable 1 View  08/19/2013   CLINICAL DATA:  Hyperglycemia.  EXAM: PORTABLE CHEST - 1 VIEW  COMPARISON:  07/19/2013.  FINDINGS: Examination limited by portable technique and patient's habitus. This particularly limits evaluation of the right thorax.  AICD is in place. Cardiomegaly. Central pulmonary vascular prominence.  No gross pneumothorax.  No segmental consolidation.  The patient would eventually benefit from two view chest.  IMPRESSION: Examination limited by portable technique and patient's habitus. This particularly limits evaluation of the right thorax.  AICD is in place. Cardiomegaly. Central pulmonary vascular prominence.  The patient would eventually benefit from two view chest.   Electronically Signed   By: Chauncey Cruel M.D.   On: 08/19/2013 14:46   Mm Digital Diagnostic Bilat  08/02/2013   CLINICAL DATA:  53 year old female with possible masses within both breasts on screening mammogram.  EXAM: DIGITAL DIAGNOSTIC  BILATERAL MAMMOGRAM WITH CAD  ULTRASOUND RIGHT BREAST  COMPARISON:  07/19/2013 and prior mammograms dating back to 07/16/2012  ACR Breast Density Category a: The breast tissue is almost entirely fatty.  FINDINGS: Spot compression views of both breasts and a ML view of the left breast demonstrate no persistent abnormality in the area of the left screening study finding.   A faint 6 mm oval circumscribed nodular density within the central right breast is identified.  Mammographic images were processed with CAD.  On physical exam, no palpable abnormalities are identified within the right breast.  Ultrasound is performed, showing no solid or cystic mass, distortion or abnormal areas of shadowing within the entire central, upper, or lower right breast.  IMPRESSION: Faint 6 mm nodular density within the right breast without palpable or sonographic correlate. This probably represents a benign finding but 6 month followup is recommended to ensure stability.  No persistent abnormality within the left breast, in the area of the screening study finding.  RECOMMENDATION: Right diagnostic mammogram and possible right breast ultrasound in 6 months.  I have discussed the findings and recommendations with the patient. Results were also provided in writing at the conclusion of the visit. If applicable, a reminder letter will be sent to the patient regarding the next appointment.  BI-RADS CATEGORY  3: Probably benign finding(s) - short interval follow-up suggested.   Electronically Signed   By: Hassan Rowan M.D.   On: 08/02/2013 15:02   US Breast Ltd Uni Right Inc Axilla  08/02/2013   CLINICAL DATA:  53 year old female with possible masses within both breasts on screening mammogram.  EXAM: DIGITAL DIAGNOSTIC  BILATERAL MAMMOGRAM WITH CAD  ULTRASOUND RIGHT BREAST  COMPARISON:  07/19/2013 and prior mammograms dating back to 07/16/2012  ACR Breast Density Category a: The breast tissue is almost entirely fatty.  FINDINGS: Spot compression views of both breasts and a ML view of the left breast demonstrate no persistent abnormality in the area of the left screening study finding.  A faint 6 mm oval circumscribed nodular density within the central right breast is identified.  Mammographic images were processed with CAD.  On physical exam, no palpable abnormalities are identified within the right breast.   Ultrasound is performed, showing no solid or cystic mass, distortion or abnormal areas of shadowing within the  entire central, upper, or lower right breast.  IMPRESSION: Faint 6 mm nodular density within the right breast without palpable or sonographic correlate. This probably represents a benign finding but 6 month followup is recommended to ensure stability.  No persistent abnormality within the left breast, in the area of the screening study finding.  RECOMMENDATION: Right diagnostic mammogram and possible right breast ultrasound in 6 months.  I have discussed the findings and recommendations with the patient. Results were also provided in writing at the conclusion of the visit. If applicable, a reminder letter will be sent to the patient regarding the next appointment.  BI-RADS CATEGORY  3: Probably benign finding(s) - short interval follow-up suggested.   Electronically Signed   By: Hassan Rowan M.D.   On: 08/02/2013 15:02    Admission HPI:  [name] is a 53 y.o. 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   Patient has a home health nurse who comes to her house weekly. She checked her blood sugars today and they were greater than 500. She was thus sent to the ER for further evaluation. She denies a history of diabetes. She reports feeling very thirsty and endorses polyuria since Friday. She admits to eating a lot of unhealthy food this Easter weekend, including sweets and a salty ham.   Because of her thirst, she admits to not adhering to her usual fluid restriction diet for her CHF. Since Friday she reports leg swelling and an 8 pound weight gain. Denies chest pain or shortness of breath. Denies orthopnea or PND. She had some NBNB vomiting and diarrhea over the  weekend, but this has resolved. She denies fevers, chills, abdominal pain. She endorses some cramping in her legs and hands. She's been compliant with her medications.   She quit smoking in 2010. Does not use alcohol or drugs.   In the ED she was given Lasix IV 60 mg x1, Norco 1 tab by mouth x1, and she was started on an insulin drip.  Physical Exam:  Blood pressure 112/69, pulse 95, temperature 97.6 F (36.4 C), resp. rate 14, last menstrual period 05/05/2012, SpO2 97.00%.  Physical Exam  Constitutional: She is oriented to person, place, and time and well-developed, well-nourished, and in no distress.  Morbidly obese  HENT:  Head: Normocephalic and atraumatic.  Eyes: Conjunctivae and EOM are normal. Pupils are equal, round, and reactive to light.  Neck: Normal range of motion. Neck supple.  Cardiovascular: Normal rate, regular rhythm and normal heart sounds. Exam reveals no gallop and no friction rub.  No murmur heard. Exam limited by body habitus  Pulmonary/Chest: Effort normal. No respiratory distress. She has no wheezes. She has rales (Perhaps some fine crackles at the bases bilaterally). She exhibits no tenderness.  Exam limited by body habitus  Abdominal: Soft. Bowel sounds are normal. She exhibits no distension. There is no tenderness.  Musculoskeletal: Normal range of motion. She exhibits edema (2 pitting edema to the knees bilaterally). She exhibits no tenderness.  Neurological: She is alert and oriented to person, place, and time. No cranial nerve deficit. GCS score is 15.  Skin: Skin is warm and dry.  Psychiatric: Affect normal.    Hospital Course by problem list: [name] is a 53 y.o. 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   1.  Hyperglycemia in the setting of uncontrolled DM2 - Patient presented with polyuria, polydipsia x3 days. Glucose elevated to 719. She was on no diabetes medications and did not appear to carry a diagnosis of diabetes in her problem list, despite elevated A1C of 6.7 in 05/14. Glucose was 140s-220s on prior BMPs over the past 2 months. This is likely an acute on chronic process in the setting of dietary noncompliance over the Easter weekend. We did not suspect DKA as urine was negative for ketones, her anion gap was 11-14, and there was no acidosis on ABG. A1C checked and 13.0. She was placed on an insulin drip overnight then transitioned to Lantus QHS, SSI, and a carb-modified diet. CBGs remained elevated over the course of her stay (200-400s), so Lantus was up titrated to 35 units nightly. The diabetes coordinator and nutritionist both met with the patient several times to provide education on insulin administration, disease process, and diet. At discharge the patient was given a script for Lantus Solostar pens and pen needles as well as a paper script for a glucose meter and supplies (Pt has Medicare and Medicaid so AccuChek meter, strips, and lancets are preferred). She will need additional insulin titration and education in the clinic. We did not start her on mealtime coverage as an outpatient, since the patient was still struggling to learn the ins-and-outs of giving herself insulin and we felt she would benefit from more education and practice. Close OP diabetes education follow-up was arranged in the [facility].  2. Mild acute on chronic combined CHF - Patient complained of acute on chronic leg swelling. Denied SOB, orthopnea, PND. Weight was stable. ProBNP elevated at 559. 2-D echo on 06/04/2013 showed EF 21-19%, normal systolic function, abnormal septal motion, mitral valve status post repair and ring, no stenosis or residual regurgitation, right ventricle appears dilated and hypokinetic. We provided Lasix 60 mg  IV x2 and resumed her home diuretics. Brisk urine output noted. CXR 4/7 showed vascular pattern within normal limits with no evidence of pulmonary edema or pleural effusion. Troponins were trended negative x3. No EKG changes. She was discharged on home regimen. Outpatient PT was arranged.  Filed Weights    08/19/13 2300  08/21/13 0009  08/22/13 0511   Weight:  388 lb 14.3 oz (176.4 kg)  387 lb 9.1 oz (175.8 kg)  386 lb 12.9 oz (175.455 kg)    3. CKD 3 - Cr baseline is 1.4. It did bump while she was here, likely 2/2 additional diuresis with Lasix IV. Bicarb stable. Please monitor closely in outpatient setting.  Creatinine, Ser   Date  Value  Ref Range  Status   08/22/2013  1.79*  0.50 - 1.10 mg/dL  Final   08/21/2013  1.66*  0.50 - 1.10 mg/dL  Final   08/20/2013  1.44*  0.50 - 1.10 mg/dL  Final   08/19/2013  1.46*  0.50 - 1.10 mg/dL  Final   08/19/2013  1.39*  0.50 - 1.10 mg/dL  Final     4. Foul-smelling urine - On HOD2 patient complained of smelly urine and polyuria. UA 4/6 as below, micro shows few squams, many bacteria, too numerous WBC. No prior urine cultures in system to guide treatment. She was treated empirically for a  UTI with Cipro 250 bid x 3 days in 01/15, so we repeated this regimen. Follow up urine culture.  Urinalysis    Component  Value  Date/Time    COLORURINE  YELLOW  08/20/2013 1229    APPEARANCEUR  HAZY*  08/20/2013 1229    LABSPEC  1.017  08/20/2013 1229    PHURINE  7.5  08/20/2013 1229    GLUCOSEU  500*  08/20/2013 1229    HGBUR  NEGATIVE  08/20/2013 1229    BILIRUBINUR  NEGATIVE  08/20/2013 1229    KETONESUR  NEGATIVE  08/20/2013 1229    PROTEINUR  NEGATIVE  08/20/2013 1229    UROBILINOGEN  0.2  08/20/2013 1229    NITRITE  NEGATIVE  08/20/2013 1229    LEUKOCYTESUR  MODERATE*  08/20/2013 1229    5. Hypokalemia - K as below. Mag 2.3. We resumed her home K-dur 76mq BID. Repeat BMP as outpatient.  Potassium   Date  Value  Ref Range  Status   08/22/2013  3.1*  3.7 - 5.3 mEq/L  Final   08/21/2013   3.5*  3.7 - 5.3 mEq/L  Final   08/20/2013  3.9  3.7 - 5.3 mEq/L  Final   08/19/2013  4.1  3.7 - 5.3 mEq/L  Final   08/19/2013  4.1  3.7 - 5.3 mEq/L  Final    6. Lower extremity edema with skin changes - Chronic issue. Was using UNNA boots at home. On Xarelto from chronic LE DVTs. Wound care saw the patient. She had some blistering which had not evolved into wounds. Ortho tech notified to apply bilateral Una boots and coban and change Q WED. Patient will resume follow-up with home health to [place].   7. Nonproductive cough - CXR not suggestive of pneumonia. No leukocytosis or fever. No phlegm. Lung exam nonfocal. We provided Mucinex BID and Tessalon BID prn with symptomatic improvement.  8. HTN - Normotensive on home medications as above.   9. OSA - We provided CPAP QHS.  10. Depression - We continued home amitriptyline 150 mg daily. She requested a refill for this, which I provided.  11. History of microcytic anemia - Baseline hemoglobin is 10-11. Stable. We continued iron supplementation with ferrous sulfate 325 mg twice a day.  12. GERD - Continued PPI, Protonix 40 mg daily.   13. Constipation - Continued home Colace 200 mg daily, MiraLAX 17 g daily as needed.   13. Chronic pain - Continued home Norco. Patient requested a refill upon discharge, claiming Dr. SMichail Sermonwas out of town. I provided a small number of pills and asked her to request her official refill in the clinic setting at her follow up appointment.   Discharge Vitals:   BP 119/91  Pulse 100  Temp(Src) 98.2 F (36.8 C) (Oral)  Resp 20  Ht '5\' 6"'  (1.676 m)  Wt 386 lb 12.9 oz (175.455 kg)  BMI 62.46 kg/m2  SpO2 98%  LMP 05/05/2012  Discharge Labs:  Results for orders placed during the hospital encounter of 08/19/13 (from the past 24 hour(s))  GLUCOSE, CAPILLARY     Status: Abnormal   Collection Time    08/21/13 12:52 PM      Result Value Ref Range   Glucose-Capillary 255 (*) 70 -  99 mg/dL  GLUCOSE, CAPILLARY     Status: Abnormal   Collection Time    08/21/13  4:28 PM      Result Value Ref Range  Glucose-Capillary 363 (*) 70 - 99 mg/dL  GLUCOSE, CAPILLARY     Status: Abnormal   Collection Time    08/21/13  7:58 PM      Result Value Ref Range   Glucose-Capillary 401 (*) 70 - 99 mg/dL  GLUCOSE, CAPILLARY     Status: Abnormal   Collection Time    08/22/13 12:04 AM      Result Value Ref Range   Glucose-Capillary 322 (*) 70 - 99 mg/dL   Comment 1 Notify RN    BASIC METABOLIC PANEL     Status: Abnormal   Collection Time    08/22/13  6:20 AM      Result Value Ref Range   Sodium 131 (*) 137 - 147 mEq/L   Potassium 3.1 (*) 3.7 - 5.3 mEq/L   Chloride 85 (*) 96 - 112 mEq/L   CO2 30  19 - 32 mEq/L   Glucose, Bld 302 (*) 70 - 99 mg/dL   BUN 30 (*) 6 - 23 mg/dL   Creatinine, Ser 1.79 (*) 0.50 - 1.10 mg/dL   Calcium 8.6  8.4 - 10.5 mg/dL   GFR calc non Af Amer 31 (*) >90 mL/min   GFR calc Af Amer 36 (*) >90 mL/min  GLUCOSE, CAPILLARY     Status: Abnormal   Collection Time    08/22/13  8:18 AM      Result Value Ref Range   Glucose-Capillary 315 (*) 70 - 99 mg/dL    Signed: Lesly Dukes, MD 08/22/2013, 11:45 AM   Time Spent on Discharge: 35 minutes  .Services Needed at time of discharge: Y = Yes, Blank = No  PT:  Outpatient PT   OT:    RN:  [facility] is actively providing the following services: RN   Equipment:    Other:

## 2013-08-22 NOTE — Telephone Encounter (Signed)
   Reason for call:   I received a call from [name]'s son in law Joey at 5:12  PM indicating they are unable to pick up the Lantus that was ordered for [name] today on discharge as it is not covered by her insurance.  He also informed me that the advance home care RN just left the house and said the patient is not stable for home due to dizziness and shortness of breath and that she needs to return to the hospital.   Pertinent Data:   Aurelio Brash is bringing [name] back to the hospital her advance RN instructions  I discussed with Dr. Claudell Kyle who discharged her earlier today in regards to the insulin prescription.  She said the insulin should be covered by the insurance as per diabetes educator. Dr. Claudell Kyle will call the pharmacy to see what is covered and make adjustments, apparently patient has both medicare and medicaid.   This will likely also be addressed if patient is indeed brought back to the hospital   Diuresed this admission with improvement in SOB per admitting team   Assessment / Plan / Recommendations:   [name] is a 53 year old female with dCHF, severe MR, DVTs, HTN, and DM2 discharged earlier today for admission for hyperglycemia in setting of uncontrolled DM2. Her son in law called to state that the Lantus is not covered by her insurance and they need something cheaper and also that home health RN recommended her to return to hospital as she is not "safe" at home. He says he was told she is still dizzy and SOB.   I confirmed on the phone twice that he is going to bring her back to the hospital at this time  Dr. Claudell Kyle will try to change the insulin to what is affordable and discuss with pharmacy to make sure Lantus is not covered and discuss with patient. This will also be addressed if patient is indeed brought back to hospital.   Day team notified of [name]'s potential return and will also let night float team know in case she needs  re-admission.  As always, pt is advised that if symptoms worsen or new symptoms arise, they should go to an urgent care facility or to to ER for further evaluation.   Darden Palmer, MD   08/22/2013, 5:25 PM

## 2013-08-22 NOTE — Progress Notes (Signed)
Dc orders received.  Patient stable with no S/S of distress.  Medication and discharge information reviewed with patient.  Patient awaiting DC home with family. [name] [facility]

## 2013-08-22 NOTE — Progress Notes (Signed)
Physical Therapy Treatment Patient Details Name: [name] MRN: [id number] DOB: 1960-09-18 Today's Date: 08/22/2013    History of Present Illness Patient is a 53 year old woman with history of congestive heart failure status post AICD, severe mitral regurgitation status post mitral valve replacement, DVT, GERD, hypertension, and other problems as outlined in the medical history admitted with recent polyuria/polydipsia and markedly elevated blood sugar noted by home health nurse.  Pt dx with uncontrolled DM, and mild acute on chronic heart failure in the setting of diet non-compliance.     PT Comments    Pt is progressing slowly with her mobility.  HEP given for seated leg strengthening.  I do not know that the pt is ready for the intensity of OP PT at this time.  Changing recommendation to HHPT and home therapist will progress pt to OP PT when ready.  Pt is still very limited in her gait distance and even from the handicap spaces, I do not know if she could safely get into the building to participate in the therapy at OP.    Follow Up Recommendations  Home health PT     Equipment Recommendations  None recommended by PT    Recommendations for Other Services   NA     Precautions / Restrictions Precautions Precautions: Fall Precaution Comments: limited gait distance, "bad" left knee.     Mobility    Transfers Overall transfer level: Needs assistance Equipment used: Rolling walker (2 wheeled) Transfers: Sit to/from Stand Sit to Stand: Min guard         General transfer comment: pt using momentum and relying heavily on upper extremities to get out of low recliner chair.  Therapist stabilizing RW during transition to stand.   Ambulation/Gait Ambulation/Gait assistance: Min guard Ambulation Distance (Feet): 70 Feet Assistive device: Rolling walker (2 wheeled) Gait Pattern/deviations: Step-through pattern;Shuffle;Trunk flexed Gait velocity: decreased Gait velocity  interpretation: Below normal speed for age/gender General Gait Details: Pt limited by DOE, weakness, decreased functional strength.  L knee continues to snap back into hyperextension and when doing seated leg exercises is the weaker side.  Pt leaning over bari RW for two reasons: 1.  Her body habitus will not let her get inside of even the bari RW, and 2. She uses a rollator at home which predisposes her to the forward flexed posture. Pt reporting that her tighs are rubbing together and raw-RN made aware.           Balance Overall balance assessment: History of Falls;Needs assistance         Standing balance support: Bilateral upper extremity supported Standing balance-Leahy Scale: Poor                      Cognition Arousal/Alertness: Awake/alert Behavior During Therapy: WFL for tasks assessed/performed Overall Cognitive Status: Within Functional Limits for tasks assessed                      Exercises General Exercises - Lower Extremity Long Arc Quad: AROM;Both;10 reps;Seated Hip ABduction/ADduction: AROM;Both;10 reps;Seated (adduction only against pillow) Hip Flexion/Marching: AROM;Both;10 reps;Seated Toe Raises: AROM;Both;20 reps;Seated Heel Raises: AROM;Both;20 reps;Seated Other Exercises Other Exercises: HEP paper give of exercises listed above.  Encouraged pt to do them 3 times per day.         Pertinent Vitals/Pain HR max 100 bpm during mobilit           PT Goals (current goals can now be found in the  care plan section) Acute Rehab PT Goals Patient Stated Goal: To get stronger for bariatric surgery, then TKA Progress towards PT goals: Progressing toward goals    Frequency  Min 3X/week    PT Plan Discharge plan needs to be updated       End of Session   Activity Tolerance: Patient limited by fatigue Patient left: in chair;with call bell/phone within reach     Time: 1053-1119 PT Time Calculation (min): 26 min  Charges:  $Gait  Training: 8-22 mins $Therapeutic Exercise: 8-22 mins                     Katelynd Blauvelt B. Ernan Runkles, PT, DPT [phone]   08/22/2013, 1:09 PM

## 2013-08-22 NOTE — Progress Notes (Signed)
Subjective: Patient seen and examined at the bedside this morning. She feels well. Still with a dry cough, but happy to be going home. She is amenable to close follow up in the [facility] and with Lupita Leash next week. She is willing to monitor her sugars 4 times per day and write down the results.   Objective: Vital signs in last 24 hours: Filed Vitals:   08/21/13 1520 08/21/13 2100 08/22/13 0511 08/22/13 0824  BP: 104/68 105/68 103/63 143/112  Pulse: 86 88 92 95  Temp: 97.8 F (36.6 C) 98.4 F (36.9 C) 98.2 F (36.8 C)   TempSrc: Oral Oral Oral   Resp: 20 20 20    Height:      Weight:   386 lb 12.9 oz (175.455 kg)   SpO2: 100% 100% 95% 96%   Weight change: -12.2 oz (-0.345 kg)  Intake/Output Summary (Last 24 hours) at 08/22/13 1057 Last data filed at 08/22/13 0900  Gross per 24 hour  Intake    940 ml  Output   1600 ml  Net   -660 ml    Physical Exam  Constitutional: She is oriented to person, place, and time and well-developed, well-nourished, and in no distress.  Sitting in her chair watching TV. HENT:  Head: Normocephalic and atraumatic.  Eyes: Conjunctivae and EOM are normal. Pupils are equal, round, and reactive to light.  Neck: Normal range of motion. Neck supple.  Cardiovascular: Regular rate, regular rhythm and normal heart sounds. Exam reveals no gallop and no friction rub.  No murmur heard. Exam limited by body habitus  Pulmonary/Chest: Effort normal. No respiratory distress. She has no wheezes. No rales today. She exhibits no tenderness. No coughing on my exam. Exam limited by body habitus  Abdominal: Soft. Bowel sounds are normal. She exhibits no distension. There is no tenderness.  Musculoskeletal: Normal range of motion. She exhibits edema (2 pitting edema to the knees bilaterally). She exhibits no tenderness.  Neurological: She is alert and oriented to person, place, and time. No cranial nerve deficit. GCS score is 15.  Skin: Skin is warm and dry. Psychiatric:  Affect normal.   Lab Results: Basic Metabolic Panel:  Recent Labs Lab 08/19/13 1832  08/21/13 0552 08/21/13 1010 08/22/13 0620  NA 140  < > 132*  --  131*  K 4.1  < > 3.5*  --  3.1*  CL 95*  < > 87*  --  85*  CO2 27  < > 28  --  30  GLUCOSE 267*  < > 449* 341* 302*  BUN 14  < > 22  --  30*  CREATININE 1.39*  < > 1.66*  --  1.79*  CALCIUM 10.0  < > 9.2  --  8.6  MG 2.3  --   --   --   --   < > = values in this interval not displayed. CBC:  Recent Labs Lab 08/19/13 1340 08/21/13 0552  WBC 4.4 4.3  NEUTROABS 3.0  --   HGB 11.1* 11.6*  HCT 34.6* 35.8*  MCV 79.2 77.8*  PLT 262 239   Cardiac Enzymes:  Recent Labs Lab 08/19/13 2126 08/20/13 0156 08/20/13 0557  TROPONINI <0.30 <0.30 <0.30   BNP:  Recent Labs Lab 08/19/13 1340  PROBNP 559.6*   CBG:  Recent Labs Lab 08/21/13 0753 08/21/13 1252 08/21/13 1628 08/21/13 1958 08/22/13 0004 08/22/13 0818  GLUCAP 429* 255* 363* 401* 322* 315*   Urinalysis:  Recent Labs Lab 08/19/13 1404 08/20/13 1229  COLORURINE COLORLESS YELLOW  LABSPEC 1.016 1.017  PHURINE 5.0 7.5  GLUCOSEU >1000* 500*  HGBUR NEGATIVE NEGATIVE  BILIRUBINUR NEGATIVE NEGATIVE  KETONESUR NEGATIVE NEGATIVE  PROTEINUR NEGATIVE NEGATIVE  UROBILINOGEN 0.2 0.2  NITRITE NEGATIVE NEGATIVE  LEUKOCYTESUR NEGATIVE MODERATE*    Micro Results: Recent Results (from the past 240 hour(s))  URINE CULTURE     Status: None   Collection Time    08/20/13 12:29 PM      Result Value Ref Range Status   Specimen Description URINE, RANDOM   Final   Special Requests ADDED [id number]   Final   Culture  Setup Time     Final   Value: 08/21/2013 01:29     Performed at [facility]   Colony Count PENDING   Incomplete   Culture     Final   Value: Culture reincubated for better growth     Performed at [facility]   Report Status PENDING   Incomplete   Studies/Results: No results found. Medications: I have reviewed the patient's  current medications. Scheduled Meds: . amitriptyline  150 mg Oral QHS  . aspirin EC  81 mg Oral Daily  . carvedilol  18.75 mg Oral BID WC  . ciprofloxacin  250 mg Oral BID  . dextromethorphan-guaiFENesin  1 tablet Oral BID  . docusate sodium  200 mg Oral Daily  . ferrous sulfate  325 mg Oral BID WC  . hydrALAZINE  50 mg Oral TID  . insulin aspart  0-9 Units Subcutaneous TID WC  . insulin glargine  35 Units Subcutaneous QHS  . isosorbide mononitrate  90 mg Oral Daily  . metolazone  5 mg Oral Once per day on Sun Tue Thu Sat  . pantoprazole  40 mg Oral Daily  . potassium chloride SA  40 mEq Oral BID  . Rivaroxaban  20 mg Oral q morning - 10a  . spironolactone  25 mg Oral Daily  . torsemide  100 mg Oral BID   Continuous Infusions:  PRN Meds:.benzonatate, HYDROcodone-acetaminophen, polyethylene glycol, promethazine, promethazine  Assessment/Plan: [name] is a 53 y.o. 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   #Hyperglycemia in the setting of uncontrolled DM2 - A1C 13.0. CBGs still elevated overnight after increasing Lantus to 30 units QHS.  - Appreciate diabetes coordinator recs, at discharge patient will need the following: 1) Rx for whatever insulin(s) prescribed-- Patient prefers pens, so will need Rx for insulin pens- 1 box Lantus Solostar pens, 1 box Novolog Flexpens if patient prescribed correction or meal coverage, adequate number of pen needles (come in box of 100), 2) Glucose meter (Pt has Medicare and Medicaid so AccuChek meter, strips, and lancets are preferred), form in chart, 3) OP diabetes education follow-up in the [facility] - Carb modified diet - SSI, sensitive - Increase Lantus to 35 units QHS upon discharge - Will wait to add  scheduled Novolog given need for more education and her hesitance to administer QID insulin right now - CBG AC and QHS  - Phenergan prn nausea - Medically stable for discharge with close [facility] and diabetes educator follow up  Glucose-Capillary  Date Value Ref Range Status  08/22/2013 315* 70 - 99 mg/dL Final  0/0/9381 829* 70 - 99 mg/dL Final  01/17/7168 678* 70 -  99 mg/dL Final  08/21/2013 363* 70 - 99 mg/dL Final  08/21/2013 255* 70 - 99 mg/dL Final  08/21/2013 429* 70 - 99 mg/dL Final  08/20/2013 292* 70 - 99 mg/dL Final  08/20/2013 248* 70 - 99 mg/dL Final  08/20/2013 408* 70 - 99 mg/dL Final  08/20/2013 331* 70 - 99 mg/dL Final    #Acute on chronic combined CHF - Weight is stable. She is status post Lasix 60 mg IV x2. We have restarted home diuretics. CXR 4/7 showed vascular pattern within normal limits with no evidence of pulmonary edema or pleural effusion. Troponins were trended negative x3. No EKG changes. - Cardiac monitoring  - Daily ASA  - Continue home Coreg 18.75 mg twice a day  - Continue home hydralazine 50 mg 3 times a day  - Continue home Imdur 90 mg daily  - Continue home Aldactone 25 mg daily  - Continue home torsemide 100 mg twice a day  - Continue home metolazone 5 mg once per day on Sunday, Tuesday, Thursday, Saturday  - Daily weights - I/Os > UOP 1300cc yesterday - Norco 10-325 q6h prn pain  - Continue to monitor BMP  Filed Weights   08/19/13 2300 08/21/13 0009 08/22/13 0511  Weight: 388 lb 14.3 oz (176.4 kg) 387 lb 9.1 oz (175.8 kg) 386 lb 12.9 oz (175.455 kg)   #CKD 3 - Cr baseline is 1.4. Bumped over the past few days. May be 2/2 additional diuresis with Lasix IV. Bicarb stable. - Continue to monitor closely in outpatient setting  Creatinine, Ser  Date Value Ref Range Status  08/22/2013 1.79* 0.50 - 1.10 mg/dL Final  08/21/2013 1.66* 0.50 - 1.10 mg/dL Final  08/20/2013 1.44* 0.50 - 1.10 mg/dL Final  08/19/2013 1.46* 0.50 - 1.10 mg/dL Final  08/19/2013 1.39* 0.50 - 1.10  mg/dL Final    #Foul-smelling urine - On HOD2 patient complained of smelly urine and polyuria. UA 4/6 as below, micro showsStuAram CZachary Kerin Perna many bacteria,Aram CZac<MEASUREMEMedicMarland KitchenKentuckyal iD161Santi inal  08/21/2013 3.5* 3.7 - 5.3 mEq/L Final  08/20/2013 3.9  3.7 - 5.3 mEq/L Final  08/19/2013 4.1  3.7 - 5.3 mEq/L Final  08/19/2013 4.1  3.7 - 5.3 mEq/L Final    #Lower extremity edema with skin changes - Chronic issue. Was using UNNA boots at home. On Xarelto from chronic LE DVTs. She has blistering which has not evolved into wounds. Ortho tech notified to apply bilateral Una boots and coban and change Q WED. Pt can resume follow-up with home health to continue Una boot application after discharge. - Appreciate wound  care recs  #Tachycardia, resolved - HR 90s. - Continue to monitor  #Nonproductive cough - CXR not suggestive of pneumonia. No leukocytosis or fever. No phlegm. Lung exam nonfocal. - Mucinex BID - Tessalon BID prn - Continue to monitor  #HTN - Normotensive currently. Continue home medications as above.   #OSA - CPAP QHS  #Depression - Continue home amitriptyline 150 mg  daily.  #History of microcytic anemia - Baseline hemoglobin is 10-11. Stable. - Continue iron supplementation with ferrous sulfate 325 mg twice a day   #GERD - Continue PPI, Protonix 40 mg daily.  #Constipation - Continue home Colace 200 mg daily, MiraLAX 17 g daily as needed.   #DVT PPX - Patient has a history of bilateral femoral DVTs, continue home Xarelto.   Dispo: Anticipated discharge in approximately 0-1 day(s).   The patient does have a current PCP Ula Lingo Montey Hora, MD) and does need an [facility] hospital follow-up appointment after discharge.  The patient does not have transportation limitations that hinder transportation to clinic appointments.  .Services Needed at time of discharge: Y = Yes, Blank = No PT: Outpatient PT  OT:   RN: [facility] is actively providing the following services: RN  Equipment:   Other: Please see document in chart for order for Blood Glucose Meter. Patient needs to take to her Pharmacy as an order to improve the chance of coverage    LOS: 3 days   Vivi Barrack, MD 08/22/2013, 10:57 AM  Vivi Barrack, MD  [email or web address] Pager # [phone] Office # [phone]

## 2013-08-22 NOTE — Progress Notes (Signed)
Spoke with the patient's pharmacy, [facility] [address]. It appears the problem is that her Medicaid will NOT cover prescriptions since she also has Medicare rx coverage. Her Medicare plan would not pay for the Lantus, it prefers Levemir.   There was also a problem with the meter/supplies rx because I was told by the diabetes educator to give the patient a paper rx form (which she provided in the patient's chart and told me would make the supplies more affordable for the patient). However, this form had the wrong diagnosis code for diabetes, so the pharmacy would not accept it. I will call the diabetes educator dept about this tomorrow because this form needs to be updated so other patients do not have this issue.  I have re-ordered her insulin as Levemir pens, which the pharmacist assured me IS covered by her insurance. I have also re-ordered her glucometer and testing supplies electronically and included the necessary information, diagnosis code, etc.  If the patient is not re-admitted, she can pick these up at her pharmacy tonight or tomorrow. I will call her son-in-law and let him know.  Vivi Barrack, MD  [email or web address] Pager # [phone] Office # [phone]

## 2013-08-22 NOTE — Telephone Encounter (Signed)
  Reason for call:   I placed an outgoing call to Ms. Isidoro Donning at 6 PM regarding her symptoms and insulin prescription issues.   Assessment/ Plan:   I spoke with the patient tonight about the issues with her insulin prescription that came up this afternoon.   Details are in my "orders-only" note from earlier this afternoon - but in short I have changed the prescription to the specific insulin pen on her formulary and provided the needed information for her to get her glucometer. I spoke personally to the pharmacist and these supplies will be ready for her tonight. I apologized for the inconvenience.  Patient says her home health nurse came by this morning, and felt the patient was dizzy and short of breath. This is new from this morning. She told me she thinks the problem may be related to constipation as she has not had a BM in 4 days. (She did not mention this to [place] in the hospital.)  I offered to arrange for her to be seen in the clinic tomorrow. She told me her daughter felt she needed to be re-evalauted tonight, so is taking her back to the hospital. I said this was fine and that they can make the assessment as to whether she should be re-admitted.  In either case, the issues with her insulin and glucometer have been resolved, she may pick these supplies up today or tomorrow if she is not admitted to the hospital, or later if she is.   As always, pt is advised that if symptoms worsen or new symptoms arise, they should go to an urgent care facility or to to ER for further evaluation.   Vivi Barrack, MD   08/22/2013, 6:03 PM

## 2013-08-22 NOTE — Telephone Encounter (Signed)
   Reason for call:   I received a call from [facility] pharmacy for [name] at 6:45 PM indicating the free style glucose test strips, lancet, and testing device originally prescribed by Dr. Claudell Kyle today are not able to be filled due to Dr. Claudell Kyle not being an "approved" Medicare part D provider".   Pertinent Data:   I tried reordering all three orders electronically to pharmacy   Assessment / Plan / Recommendations:   [name] is a 53 year old female with DM2 discharged earlier today.  We have received a call from her [facility] unable to fill her diabetes testing supplies by Dr. Claudell Kyle.   Supplies were re-ordered by myself this evening. I remained on hold with pharmacist to see if they are able to go through. She was able to confirm that the lancet order went through and was approved and that the other supplies should likely also be processed. She was given the on-call pager number in case there are any more issues.   I will also forward this note to Dr. Claudell Kyle for her information   Darden Palmer, MD   08/22/2013, 6:46 PM

## 2013-08-23 ENCOUNTER — Emergency Department (HOSPITAL_COMMUNITY)
Admission: EM | Admit: 2013-08-23 | Discharge: 2013-08-23 | Disposition: A | Discharge status: Home / Self Care | Payer: Medicare Other | Attending: Emergency Medicine | Admitting: Emergency Medicine

## 2013-08-23 ENCOUNTER — Telehealth: Payer: Self-pay | Admitting: Internal Medicine

## 2013-08-23 ENCOUNTER — Telehealth: Payer: Self-pay | Admitting: *Deleted

## 2013-08-23 DIAGNOSIS — K219 Gastro-esophageal reflux disease without esophagitis: Secondary | ICD-10-CM | POA: Insufficient documentation

## 2013-08-23 DIAGNOSIS — F411 Generalized anxiety disorder: Secondary | ICD-10-CM | POA: Insufficient documentation

## 2013-08-23 DIAGNOSIS — R739 Hyperglycemia, unspecified: Secondary | ICD-10-CM

## 2013-08-23 DIAGNOSIS — Z87440 Personal history of urinary (tract) infections: Secondary | ICD-10-CM | POA: Insufficient documentation

## 2013-08-23 DIAGNOSIS — Z792 Long term (current) use of antibiotics: Secondary | ICD-10-CM | POA: Insufficient documentation

## 2013-08-23 DIAGNOSIS — M069 Rheumatoid arthritis, unspecified: Secondary | ICD-10-CM | POA: Insufficient documentation

## 2013-08-23 DIAGNOSIS — E119 Type 2 diabetes mellitus without complications: Secondary | ICD-10-CM | POA: Insufficient documentation

## 2013-08-23 DIAGNOSIS — F3289 Other specified depressive episodes: Secondary | ICD-10-CM | POA: Insufficient documentation

## 2013-08-23 DIAGNOSIS — Z8742 Personal history of other diseases of the female genital tract: Secondary | ICD-10-CM | POA: Insufficient documentation

## 2013-08-23 DIAGNOSIS — Z8659 Personal history of other mental and behavioral disorders: Secondary | ICD-10-CM | POA: Insufficient documentation

## 2013-08-23 DIAGNOSIS — J4489 Other specified chronic obstructive pulmonary disease: Secondary | ICD-10-CM | POA: Insufficient documentation

## 2013-08-23 DIAGNOSIS — Z7901 Long term (current) use of anticoagulants: Secondary | ICD-10-CM | POA: Insufficient documentation

## 2013-08-23 DIAGNOSIS — F329 Major depressive disorder, single episode, unspecified: Secondary | ICD-10-CM | POA: Insufficient documentation

## 2013-08-23 DIAGNOSIS — Z95810 Presence of automatic (implantable) cardiac defibrillator: Secondary | ICD-10-CM | POA: Insufficient documentation

## 2013-08-23 DIAGNOSIS — I5022 Chronic systolic (congestive) heart failure: Secondary | ICD-10-CM | POA: Insufficient documentation

## 2013-08-23 DIAGNOSIS — Z87891 Personal history of nicotine dependence: Secondary | ICD-10-CM | POA: Insufficient documentation

## 2013-08-23 DIAGNOSIS — G8929 Other chronic pain: Secondary | ICD-10-CM | POA: Insufficient documentation

## 2013-08-23 DIAGNOSIS — J449 Chronic obstructive pulmonary disease, unspecified: Secondary | ICD-10-CM | POA: Insufficient documentation

## 2013-08-23 DIAGNOSIS — M109 Gout, unspecified: Secondary | ICD-10-CM | POA: Insufficient documentation

## 2013-08-23 DIAGNOSIS — Z791 Long term (current) use of non-steroidal anti-inflammatories (NSAID): Secondary | ICD-10-CM | POA: Insufficient documentation

## 2013-08-23 DIAGNOSIS — Z86718 Personal history of other venous thrombosis and embolism: Secondary | ICD-10-CM | POA: Insufficient documentation

## 2013-08-23 DIAGNOSIS — F4310 Post-traumatic stress disorder, unspecified: Secondary | ICD-10-CM | POA: Insufficient documentation

## 2013-08-23 DIAGNOSIS — Z9889 Other specified postprocedural states: Secondary | ICD-10-CM | POA: Insufficient documentation

## 2013-08-23 DIAGNOSIS — Z794 Long term (current) use of insulin: Secondary | ICD-10-CM | POA: Insufficient documentation

## 2013-08-23 DIAGNOSIS — Z79899 Other long term (current) drug therapy: Secondary | ICD-10-CM | POA: Insufficient documentation

## 2013-08-23 DIAGNOSIS — G4733 Obstructive sleep apnea (adult) (pediatric): Secondary | ICD-10-CM | POA: Insufficient documentation

## 2013-08-23 DIAGNOSIS — D509 Iron deficiency anemia, unspecified: Secondary | ICD-10-CM | POA: Insufficient documentation

## 2013-08-23 DIAGNOSIS — I5032 Chronic diastolic (congestive) heart failure: Secondary | ICD-10-CM | POA: Insufficient documentation

## 2013-08-23 DIAGNOSIS — I10 Essential (primary) hypertension: Secondary | ICD-10-CM | POA: Insufficient documentation

## 2013-08-23 LAB — CBG MONITORING, ED
GLUCOSE-CAPILLARY: 482 mg/dL — AB (ref 70–99)
Glucose-Capillary: 453 mg/dL — ABNORMAL HIGH (ref 70–99)

## 2013-08-23 MED ORDER — INSULIN ASPART 100 UNIT/ML ~~LOC~~ SOLN: 5.0000 [IU] | Freq: Once | SUBCUTANEOUS | Status: DC

## 2013-08-23 MED ORDER — INSULIN ASPART 100 UNIT/ML ~~LOC~~ SOLN
5.0000 [IU] | Freq: Once | SUBCUTANEOUS | Status: AC
Start: 2013-08-23 — End: 2013-08-23
  Administered 2013-08-23: 5 [IU] via SUBCUTANEOUS
  Filled 2013-08-23: qty 1

## 2013-08-23 NOTE — ED Provider Notes (Signed)
CSN: [id number]     Arrival date & time 08/23/13  1433 History   First MD Initiated Contact with Patient 08/23/13 1441     Chief Complaint  Patient presents with  . Hyperglycemia     (Consider location/radiation/quality/duration/timing/severity/associated sxs/prior Treatment) HPI  Patient to the ER from home at the recommendation at the patients at [place] for patients hyperglycemia. She was admitted to the hospital on 4/6 for new onset diabetes and discharged yesterday on 4/9. The patient has all of her medications and numerous resources for diabetes education but has not taken any of her diabetes medications today. She has also not checked her glucose today herself. Her home health nurse noted her glucose to be 426 and therefore sent her to the ER. The patient has no new symptoms but is her normal symptoms of her chronic medical problems which is not different from her baseline. She is presenting for any medical concerns today  Past Medical History  Diagnosis Date  . Severe mitral regurgitation     a. Minimally invasive MV repair on 12/18/08. Additionally TV repair  and PFO closure.  . Chronic systolic CHF (congestive heart failure)     a. 12/2011 Echo: EF 30%, diff HK, mild LVH, Gr 1 DD, mildly dil LA. b. 2/2 NICM.  Marland Kitchen Microcytic anemia     a. Small capsule endoscopy 2012 with gastric erosion and small colonic AVMs. b. EGD and colonoscopy before that in 12/11 did not reveal source of bleeding)  . Gastric ulcer   . NICM (nonischemic cardiomyopathy)     a.12/2011: s/p SJM [id number] single lead ICD, Ser #: [id number]  . Obstructive sleep apnea     on CPAP  . GERD (gastroesophageal reflux disease)   . HTN (hypertension)   . Gout   . Depression   . Colitis 2/11  . Chronic back pain   . Chronic knee pain   . DVT (deep venous thrombosis)     right leg  . Anxiety   . Rheumatoid arthritis(714.0)     "shoulders & knees"  . Sleep disturbance     07/14/11 "haven't slept in 10 months;  since going to [facility]"  . Hx: UTI (urinary tract infection)   . Nocturia   . COPD (chronic obstructive pulmonary disease)   . Bronchitis   . PTSD (post-traumatic stress disorder)   . Schizophrenia   . Dysmenorrhea   . Morbid obesity   . Acute renal insufficiency     a. ?Cardiorenal 09/2012.  Marland Kitchen Hyperglycemia   . Automatic implantable cardioverter-defibrillator in situ   . Chronic diastolic HF (heart failure)     a. ECHO (05/2013) EF 50-55%, RV dilated and [facility]   Past Surgical History  Procedure Laterality Date  . Cardiac valve replacement  12/2008    cardiac  . Cardiac catheterization  04/20/2009, 11/04/2008  . Right knee arthroscopy  06/11/2004  . Extraction of teeth  12/04/2008  . Right mini thoracotomy for mitral valve repair and closure of [place]  12/18/2008  . Cardiac defibrillator placement     Family History  Problem Relation Age of Onset  . Hypertension Sister   . Alcoholism Father     died in his 41's or 12's  . Other Mother     alive & well @ 53.   History  Substance Use Topics  . Smoking status: Former Smoker -- 0.50 packs/day for 27 years    Types: Cigarettes    Quit date: 12/18/2008  .  Smokeless tobacco: Never Used  . Alcohol Use: No     Comment: "stopped drinking alcohol 12/2003; did drink ~ 6 shots/wk"   OB History   Grav Para Term Preterm Abortions TAB SAB Ect Mult Living   _0 Review of Systems  All other systems reviewed and are negative.  Refer to HPI   Allergies  Risperidone; Colchicine; Morphine; and Shrimp  Home Medications   Current Outpatient Rx  Name  Route  Sig  Dispense  Refill  . amitriptyline (ELAVIL) 150 MG tablet   Oral   Take 1 tablet (150 mg total) by mouth at bedtime.   30 tablet   1   . benzonatate (TESSALON) 100 MG capsule   Oral   Take 1 capsule (100 mg total) by mouth 2 (two) times daily as needed for cough.   20 capsule   0   . carvedilol (COREG) 12.5 MG tablet   Oral   Take 1.5 tablets (18.75 mg  total) by mouth 2 (two) times daily with a meal.   90 tablet   3   . ciprofloxacin (CIPRO) 250 MG tablet   Oral   Take 1 tablet (250 mg total) by mouth 2 (two) times daily.   3 tablet   0   . dextromethorphan-guaiFENesin (MUCINEX DM) 30-600 MG per 12 hr tablet   Oral   Take 1 tablet by mouth 2 (two) times daily.   20 tablet   0   . diclofenac sodium (VOLTAREN) 1 % GEL   Topical   Apply 4 g topically 4 (four) times daily.   1 Tube   3   . docusate sodium (COLACE) 100 MG capsule   Oral   Take 200 mg by mouth daily.          . Febuxostat 80 MG TABS   Oral   Take 80 mg by mouth daily.          . ferrous sulfate 325 (65 FE) MG tablet   Oral   Take 1 tablet (325 mg total) by mouth 2 (two) times daily with a meal.   180 tablet   1   . glucose blood (FREESTYLE TEST STRIPS) test strip   Other   1 each by Other route 4 (four) times daily -  before meals and at bedtime. Use as instructed   100 each   12     Dx code 250.00   . glucose monitoring kit (FREESTYLE) monitoring kit   Does not apply   1 each by Does not apply route 4 (four) times daily -  before meals and at bedtime. Dx code 250.00   1 each   0   . hydrALAZINE (APRESOLINE) 50 MG tablet   Oral   Take 1 tablet (50 mg total) by mouth 3 (three) times daily.   90 tablet   6   . HYDROcodone-acetaminophen (NORCO) 10-325 MG per tablet   Oral   Take 1 tablet by mouth every 6 (six) hours as needed for severe pain.   30 tablet   0   . Insulin Detemir (LEVEMIR) 100 UNIT/ML Pen   Subcutaneous   Inject 35 Units into the skin daily at 10 pm.   45 mL   11   . Insulin Pen Needle 32G X 4 MM MISC      Please use 1 needle at night to administer insulin via Lantus pen  30 each   11   . isosorbide mononitrate (IMDUR) 60 MG 24 hr tablet   Oral   Take 90 mg by mouth daily.         . Lancets (FREESTYLE) lancets   Other   1 each by Other route 4 (four) times daily -  before meals and at bedtime. Use as  instructed   100 each   12     Dx code 250.00   . metolazone (ZAROXOLYN) 5 MG tablet   Oral   Take 1 tablet (5 mg total) by mouth 4 (four) times a week. Monday,Wednesday,Friday and Saturday.  30 minutes before torsemide   20 tablet   3     Please cancel all previous orders for current medi ...   . omeprazole (PRILOSEC) 40 MG capsule   Oral   Take 1 capsule (40 mg total) by mouth daily.   30 capsule   3   . polyethylene glycol (MIRALAX / GLYCOLAX) packet   Oral   Take 17 g by mouth daily as needed for moderate constipation.         . potassium chloride SA (K-DUR,KLOR-CON) 20 MEQ tablet   Oral   Take 40 mEq by mouth 2 (two) times daily.         . Rivaroxaban (XARELTO) 20 MG TABS tablet   Oral   Take 20 mg by mouth every morning.         Marland Kitchen spironolactone (ALDACTONE) 25 MG tablet   Oral   Take 25 mg by mouth daily.         Marland Kitchen torsemide (DEMADEX) 100 MG tablet   Oral   Take 1 tablet (100 mg total) by mouth 2 (two) times daily.   180 tablet   3    BP 124/102  Pulse 89  Temp(Src) 97.7 F (36.5 C)  Resp 17  SpO2 98%  LMP 05/05/2012 Physical Exam  Nursing note and vitals reviewed. Constitutional: She appears well-developed and well-nourished. No distress.  HENT:  Head: Normocephalic and atraumatic.  Eyes: Pupils are equal, round, and reactive to light.  Neck: Normal range of motion. Neck supple.  Cardiovascular: Normal rate and regular rhythm.   Pulmonary/Chest: Effort normal.  Coughing on exam  Abdominal: Soft.  Musculoskeletal:  Bilateral lower extremity swelling with ACE wrap around her legs  Neurological: She is alert.  Skin: Skin is warm and dry.    ED Course  Procedures (including critical care time) Labs Review Labs Reviewed  CBG MONITORING, ED - Abnormal; Notable for the following:    Glucose-Capillary 482 (*)    All other components within normal limits  CBG MONITORING, ED - Abnormal; Notable for the following:    Glucose-Capillary 453  (*)    All other components within normal limits   Imaging Review No results found.   EKG Interpretation None      MDM   Final diagnoses:  Hyperglycemia  Diabetes    Discussed case with Case Managment, pt has her medications and does not need assistance getting them. She has the tools to evaluate her glucose at home but needs to do so. CM is concerned with her not having proper educational tools and has given her a lot of community resources.  Patient given 5 units of insulin and glucose rechecked one hour later,   Her glucose has resolved from a 482 to a 453. Pt will be taking her Lantus 35 units at home at 10 o'clock in a few  hours. She is okay to go home at this time. MUST TAKE HER NIGHT TIME DOSE at 10 pm.  53 y.o.[name]'s evaluation in the Emergency Department is complete. It has been determined that no acute conditions requiring further emergency intervention are present at this time. The patient/guardian have been advised of the diagnosis and plan. We have discussed signs and symptoms that warrant return to the ED, such as changes or worsening in symptoms.  Vital signs are stable at discharge. Filed Vitals:   08/23/13 1717  BP: 124/102  Pulse: 89  Temp:   Resp: 17    Patient/guardian has voiced understanding and agreed to follow-up with the PCP or specialist.    Linus Mako, PA-C 08/23/13 1823

## 2013-08-23 NOTE — ED Provider Notes (Signed)
Medical screening examination/treatment/procedure(s) were performed by non-physician practitioner and as supervising physician I was immediately available for consultation/collaboration.     Geoffery Lyons, MD 08/23/13 [phone]

## 2013-08-23 NOTE — ED Notes (Signed)
CBG Taken = 482

## 2013-08-23 NOTE — Telephone Encounter (Signed)
[name] calls and states pt has not taken her insulin, cbg 467, pt is becoming strangled with everything she tries to swallow even sm sips of h2o, pt is not feeling well at all. She is ask to either call 911 or have someone drive her to urg care or ED at this time, noted pt coughing and sputtering while speaking with [name], she is having great difficulty swallowing, ask her also not to try to drink anything else until she is evaluated. They are agreeable to urg care or ED.

## 2013-08-23 NOTE — Progress Notes (Signed)
CM consult for possible medication assist for new onset diabetes. Spoke with patient and family in room. All stated that they have all the medications and the insulin, they have all supplies. They also stated that they have HH services through [facility] and a nurse came out to visit today. She also stated that one of her MD's from [facility] internal medicine called to check in but she was not feeling bad at the time. Encouraged patient to check BS as prescribed and keep a journal so the MD and [facility] RN can follow up for any necessary changes. Also placed a consult through the [facility] diabetes outpatient center for follow up education. Marlon Pel PA aware of plan. Ferdinand Cava, RN, BSN, Case Managers 08/23/2013 3:56 PM

## 2013-08-23 NOTE — ED Notes (Signed)
Patient arrived via [facility] from home with hyperglycemia. Patient was just discharged from [facility] yesterday with new onset of diabetes. According to patient she was prescribed lantus once a day. She was checked by home health today and her CBG was 426. Patient is A/O x4. No distress noted. VSS.

## 2013-08-23 NOTE — Discharge Instructions (Signed)
Blood Glucose Monitoring, Adult Monitoring your blood glucose (also know as blood sugar) helps you to manage your diabetes. It also helps you and your health care provider monitor your diabetes and determine how well your treatment plan is working. WHY SHOULD YOU MONITOR YOUR BLOOD GLUCOSE?  It can help you understand how food, exercise, and medicine affect your blood glucose.  It allows you to know what your blood glucose is at any given moment. You can quickly tell if you are having low blood glucose (hypoglycemia) or high blood glucose (hyperglycemia).  It can help you and your health care provider know how to adjust your medicines.  It can help you understand how to manage an illness or adjust medicine for exercise. WHEN SHOULD YOU TEST? Your health care provider will help you decide how often you should check your blood glucose. This may depend on the type of diabetes you have, your diabetes control, or the types of medicines you are taking. Be sure to write down all of your blood glucose readings so that this information can be reviewed with your health care provider. See below for examples of testing times that your health care provider may suggest. Type 1 Diabetes  Test 4 times a day if you are in good control, using an insulin pump, or perform multiple daily injections.  If your diabetes is not well-controlled or if you are sick, you may need to monitor more often.  It is a good idea to also monitor:  Before and after exercise.  Between meals and 2 hours after a meal.  Occasionally between 2:00 to 3:00 am. Type 2 Diabetes  It can vary with each person, but generally, if you are on insulin, test 4 times a day.  If you take medicines by mouth (orally), test 2 times a day.  If you are on a controlled diet, test once a day.  If your diabetes is not well controlled or if you are sick, you may need to monitor more often. HOW TO MONITOR YOUR BLOOD GLUCOSE Supplies  Needed  Blood glucose meter.  Test strips for your meter. Each meter has its own strips. You must use the strips that go with your own meter.  A pricking needle (lancet).  A device that holds the lancet (lancing device).  A journal or log book to write down your results. Procedure  Wash your hands with soap and water. Alcohol is not preferred.  Prick the side of your finger (not the tip) with the lancet.  Gently milk the finger until a small drop of blood appears.  Follow the instructions that come with your meter for inserting the test strip, applying blood to the strip, and using your blood glucose meter. Other Areas to Get Blood for Testing Some meters allow you to use other areas of your body (other than your finger) to test your blood. These areas are called alternative sites. The most common alternative sites are:  The forearm.  The thigh.  The back area of the lower leg.  The palm of the hand. The blood flow in these areas is slower. Therefore, the blood glucose values you get may be delayed, and the numbers are different from what you would get from your fingers. Do not use alternative sites if you think you are having hypoglycemia. Your reading will not be accurate. Always use a finger if you are having hypoglycemia. Also, if you cannot feel your lows (hypoglycemia unawareness), always use your fingers for your blood  glucose checks. ADDITIONAL TIPS FOR GLUCOSE MONITORING  Do not reuse lancets.  Always carry your supplies with you.  All blood glucose meters have a 24-hour "hotline" number to call if you have questions or need help.  Adjust (calibrate) your blood glucose meter with a control solution after finishing a few boxes of strips. BLOOD GLUCOSE RECORD KEEPING It is a good idea to keep a daily record or log of your blood glucose readings. Most glucose meters, if not all, keep your glucose records stored in the meter. Some meters come with the ability to download  your records to your home computer. Keeping a record of your blood glucose readings is especially helpful if you are wanting to look for patterns. Make notes to go along with the blood glucose readings because you might forget what happened at that exact time. Keeping good records helps you and your health care provider to work together to achieve good diabetes management.  Document Released: 05/05/2003 Document Revised: 01/02/2013 Document Reviewed: 09/24/2012 [facility] Patient Information 2014 [place], [place].  Diabetes and Foot Care Diabetes may cause you to have problems because of poor blood supply (circulation) to your feet and legs. This may cause the skin on your feet to become thinner, break easier, and heal more slowly. Your skin may become dry, and the skin may peel and crack. You may also have nerve damage in your legs and feet causing decreased feeling in them. You may not notice minor injuries to your feet that could lead to infections or more serious problems. Taking care of your feet is one of the most important things you can do for yourself.  HOME CARE INSTRUCTIONS  Wear shoes at all times, even in the house. Do not go barefoot. Bare feet are easily injured.  Check your feet daily for blisters, cuts, and redness. If you cannot see the bottom of your feet, use a mirror or ask someone for help.  Wash your feet with warm water (do not use hot water) and mild soap. Then pat your feet and the areas between your toes until they are completely dry. Do not soak your feet as this can dry your skin.  Apply a moisturizing lotion or petroleum jelly (that does not contain alcohol and is unscented) to the skin on your feet and to dry, brittle toenails. Do not apply lotion between your toes.  Trim your toenails straight across. Do not dig under them or around the cuticle. File the edges of your nails with an emery board or nail file.  Do not cut corns or calluses or try to remove them with  medicine.  Wear clean socks or stockings every day. Make sure they are not too tight. Do not wear knee-high stockings since they may decrease blood flow to your legs.  Wear shoes that fit properly and have enough cushioning. To break in new shoes, wear them for just a few hours a day. This prevents you from injuring your feet. Always look in your shoes before you put them on to be sure there are no objects inside.  Do not cross your legs. This may decrease the blood flow to your feet.  If you find a minor scrape, cut, or break in the skin on your feet, keep it and the skin around it clean and dry. These areas may be cleansed with mild soap and water. Do not cleanse the area with peroxide, alcohol, or iodine.  When you remove an adhesive bandage, be sure not to  damage the skin around it.  If you have a wound, look at it several times a day to make sure it is healing.  Do not use heating pads or hot water bottles. They may burn your skin. If you have lost feeling in your feet or legs, you may not know it is happening until it is too late.  Make sure your health care provider performs a complete foot exam at least annually or more often if you have foot problems. Report any cuts, sores, or bruises to your health care provider immediately. SEEK MEDICAL CARE IF:   You have an injury that is not healing.  You have cuts or breaks in the skin.  You have an ingrown nail.  You notice redness on your legs or feet.  You feel burning or tingling in your legs or feet.  You have pain or cramps in your legs and feet.  Your legs or feet are numb.  Your feet always feel cold. SEEK IMMEDIATE MEDICAL CARE IF:   There is increasing redness, swelling, or pain in or around a wound.  There is a red line that goes up your leg.  Pus is coming from a wound.  You develop a fever or as directed by your health care provider.  You notice a bad smell coming from an ulcer or wound. Document Released:  04/29/2000 Document Revised: 01/02/2013 Document Reviewed: 10/09/2012 [facility] Patient Information 2014 [place], [place].  Hyperglycemia Hyperglycemia occurs when the glucose (sugar) in your blood is too high. Hyperglycemia can happen for many reasons, but it most often happens to people who do not know they have diabetes or are not managing their diabetes properly.  CAUSES  Whether you have diabetes or not, there are other causes of hyperglycemia. Hyperglycemia can occur when you have diabetes, but it can also occur in other situations that you might not be as aware of, such as: Diabetes  If you have diabetes and are having problems controlling your blood glucose, hyperglycemia could occur because of some of the following reasons:  Not following your meal plan.  Not taking your diabetes medications or not taking it properly.  Exercising less or doing less activity than you normally do.  Being sick. Pre-diabetes  This cannot be ignored. Before people develop Type 2 diabetes, they almost always have "pre-diabetes." This is when your blood glucose levels are higher than normal, but not yet high enough to be diagnosed as diabetes. Research has shown that some long-term damage to the body, especially the heart and circulatory system, may already be occurring during pre-diabetes. If you take action to manage your blood glucose when you have pre-diabetes, you may delay or prevent Type 2 diabetes from developing. Stress  If you have diabetes, you may be "diet" controlled or on oral medications or insulin to control your diabetes. However, you may find that your blood glucose is higher than usual in the hospital whether you have diabetes or not. This is often referred to as "stress hyperglycemia." Stress can elevate your blood glucose. This happens because of hormones put out by the body during times of stress. If stress has been the cause of your high blood glucose, it can be followed regularly by  your caregiver. That way he/she can make sure your hyperglycemia does not continue to get worse or progress to diabetes. Steroids  Steroids are medications that act on the infection fighting system (immune system) to block inflammation or infection. One side effect can be a rise  in blood glucose. Most people can produce enough extra insulin to allow for this rise, but for those who cannot, steroids make blood glucose levels go even higher. It is not unusual for steroid treatments to "uncover" diabetes that is developing. It is not always possible to determine if the hyperglycemia will go away after the steroids are stopped. A special blood test called an A1c is sometimes done to determine if your blood glucose was elevated before the steroids were started. SYMPTOMS  Thirsty.  Frequent urination.  Dry mouth.  Blurred vision.  Tired or fatigue.  Weakness.  Sleepy.  Tingling in feet or leg. DIAGNOSIS  Diagnosis is made by monitoring blood glucose in one or all of the following ways:  A1c test. This is a chemical found in your blood.  Fingerstick blood glucose monitoring.  Laboratory results. TREATMENT  First, knowing the cause of the hyperglycemia is important before the hyperglycemia can be treated. Treatment may include, but is not be limited to:  Education.  Change or adjustment in medications.  Change or adjustment in meal plan.  Treatment for an illness, infection, etc.  More frequent blood glucose monitoring.  Change in exercise plan.  Decreasing or stopping steroids.  Lifestyle changes. HOME CARE INSTRUCTIONS   Test your blood glucose as directed.  Exercise regularly. Your caregiver will give you instructions about exercise. Pre-diabetes or diabetes which comes on with stress is helped by exercising.  Eat wholesome, balanced meals. Eat often and at regular, fixed times. Your caregiver or nutritionist will give you a meal plan to guide your sugar  intake.  Being at an ideal weight is important. If needed, losing as little as 10 to 15 pounds may help improve blood glucose levels. SEEK MEDICAL CARE IF:   You have questions about medicine, activity, or diet.  You continue to have symptoms (problems such as increased thirst, urination, or weight gain). SEEK IMMEDIATE MEDICAL CARE IF:   You are vomiting or have diarrhea.  Your breath smells fruity.  You are breathing faster or slower.  You are very sleepy or incoherent.  You have numbness, tingling, or pain in your feet or hands.  You have chest pain.  Your symptoms get worse even though you have been following your caregiver's orders.  If you have any other questions or concerns. Document Released: 10/26/2000 Document Revised: 07/25/2011 Document Reviewed: 08/29/2011 [facility] Patient Information 2014 [place], [place].

## 2013-08-23 NOTE — Progress Notes (Signed)
ED CM received call from OP Diabetic Educator concerning patient had previously been referred there. Does not think there services will be beneficial to patient. Referral then placed to [facility] OP Diabetic Education Walk-in April 16 Thurs 5:30. Pt made aware of change. Pt and family verbalized understanding and teach back done.

## 2013-08-23 NOTE — Progress Notes (Addendum)
Spoke with case manager, [name] who states pt seems to need more DM education, however pt is already connected to [facility] for preparation for gastric sleeve surgery. Pt to have HH who can be alerted to assess needs for ongoing diabetes education once she's been discharged from [facility]. May need alert OP [facility] of latest diagnosis of DM and insulin therapy.  Ad,  Recommended starting dose for lantus therapy is 0.2 units/kg which totals 35 units per day (In addition with pt at this weight, needs are most probably a bit higher than that starting dose. Please consider increasing lantus to at least 35 units and if using correction, use resistant tidwc.  Thank you, Tasha Coffin, RN, CNS, Diabetes Coordinator [phone])

## 2013-08-23 NOTE — Telephone Encounter (Signed)
  Reason for call:   I placed an outgoing call to [name] at 10:45  AM to check in on her.   Assessment/ Plan:   [name] is a 53 y.o. 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  She tells me she feels better this morning. Her breathing is good. I offered to fit her in to the clinic today, but she says she doesn't think she needs this.  She was able to get her insulin from the pharmacy last night. She is still not sure how to use it. Her home health nurse is coming today at noon to help her. I told her this was a very good think, and we will do more education with her next week at her clinic appointment with Lupita Leash.  As always, pt is advised that if symptoms worsen or new symptoms arise, they should go to an urgent care facility or to to ER for further evaluation.   Vivi Barrack, MD   08/23/2013, 10:43 AM

## 2013-08-23 NOTE — ED Notes (Signed)
CBG Taken = 453.

## 2013-08-23 NOTE — Telephone Encounter (Signed)
Recommend urgent ED evaluation via EMS. Thanks.

## 2013-08-24 LAB — URINE CULTURE

## 2013-08-26 ENCOUNTER — Ambulatory Visit (INDEPENDENT_AMBULATORY_CARE_PROVIDER_SITE_OTHER): Payer: Medicare Other | Admitting: Internal Medicine

## 2013-08-26 ENCOUNTER — Encounter: Payer: Self-pay | Admitting: Internal Medicine

## 2013-08-26 ENCOUNTER — Telehealth: Payer: Self-pay | Admitting: *Deleted

## 2013-08-26 VITALS — BP 118/76 | HR 82 | Temp 97.5°F | Wt 381.2 lb

## 2013-08-26 DIAGNOSIS — N183 Chronic kidney disease, stage 3 unspecified: Secondary | ICD-10-CM

## 2013-08-26 DIAGNOSIS — E119 Type 2 diabetes mellitus without complications: Secondary | ICD-10-CM

## 2013-08-26 DIAGNOSIS — I5032 Chronic diastolic (congestive) heart failure: Secondary | ICD-10-CM

## 2013-08-26 DIAGNOSIS — J069 Acute upper respiratory infection, unspecified: Secondary | ICD-10-CM

## 2013-08-26 DIAGNOSIS — IMO0001 Reserved for inherently not codable concepts without codable children: Secondary | ICD-10-CM

## 2013-08-26 DIAGNOSIS — E876 Hypokalemia: Secondary | ICD-10-CM

## 2013-08-26 DIAGNOSIS — J309 Allergic rhinitis, unspecified: Secondary | ICD-10-CM

## 2013-08-26 DIAGNOSIS — E1165 Type 2 diabetes mellitus with hyperglycemia: Secondary | ICD-10-CM

## 2013-08-26 DIAGNOSIS — J45909 Unspecified asthma, uncomplicated: Secondary | ICD-10-CM

## 2013-08-26 LAB — GLUCOSE, CAPILLARY: GLUCOSE-CAPILLARY: 371 mg/dL — AB (ref 70–99)

## 2013-08-26 LAB — BASIC METABOLIC PANEL WITH GFR
BUN: 39 mg/dL — ABNORMAL HIGH (ref 6–23)
CO2: 31 meq/L (ref 19–32)
CREATININE: 1.6 mg/dL — AB (ref 0.50–1.10)
Calcium: 9.6 mg/dL (ref 8.4–10.5)
Chloride: 85 mEq/L — ABNORMAL LOW (ref 96–112)
GFR, EST AFRICAN AMERICAN: 42 mL/min — AB
GFR, Est Non African American: 37 mL/min — ABNORMAL LOW
Glucose, Bld: 333 mg/dL — ABNORMAL HIGH (ref 70–99)
Potassium: 3 mEq/L — ABNORMAL LOW (ref 3.5–5.3)
SODIUM: 133 meq/L — AB (ref 135–145)

## 2013-08-26 MED ORDER — POTASSIUM CHLORIDE CRYS ER 20 MEQ PO TBCR
40.0000 meq | EXTENDED_RELEASE_TABLET | Freq: Once | ORAL | Status: AC
  Administered 2013-08-26: 40 meq via ORAL

## 2013-08-26 MED ORDER — ALBUTEROL SULFATE HFA 108 (90 BASE) MCG/ACT IN AERS: 2.0000 | INHALATION_SPRAY | Freq: Four times a day (QID) | RESPIRATORY_TRACT | Status: DC | PRN

## 2013-08-26 MED ORDER — ALBUTEROL SULFATE (2.5 MG/3ML) 0.083% IN NEBU
2.5000 mg | INHALATION_SOLUTION | Freq: Once | RESPIRATORY_TRACT | Status: AC
  Administered 2013-08-26: 2.5 mg via RESPIRATORY_TRACT

## 2013-08-26 MED ORDER — SALINE SPRAY 0.65 % NA SOLN: 1.0000 | NASAL | Status: DC

## 2013-08-26 MED ORDER — FEXOFENADINE HCL 30 MG PO TABS: 30.0000 mg | ORAL_TABLET | Freq: Two times a day (BID) | ORAL | Status: DC

## 2013-08-26 NOTE — Patient Instructions (Signed)
-  Start using Levemir 50 units at night. Check your blood sugar 4 times per day, before meals and before bedtime. Make sure to check for signs and symptoms of hypoglycemia (information provided below).  -Follow up with the Diabetes Education class on Wednesday.  -Bring your meter with you during your next visit.  -Start taking Allegra 30mg  twice per day for your nasal congestion.  -Use saline nasal spray 4 times per day for your nasal congestion.  -Use albuterol inhaler as needed for shortness of breath or wheezing.  -Start taking Mucinex for your cough.  -Follow up with in 1-2 weeks. Call us if you have fever/chills, nausea, vomiting, or diarrhea.    Low Blood Sugar Low blood sugar (hypoglycemia) means that the level of sugar in your blood is lower than it should be. Signs of low blood sugar include:  Getting sweaty.  Feeling hungry.  Feeling dizzy or weak.  Feeling sleepier than normal.  Feeling nervous.  Headaches.  Having a fast heartbeat. Low blood sugar can happen fast and can be an emergency. Your doctor can do tests to check your blood sugar level. You can have low blood sugar and not have diabetes. HOME CARE  Check your blood sugar as told by your doctor. If it is less than 70 mg/dl or as told by your doctor, take 1 of the following:  3 to 4 glucose tablets.   cup clear juice.   cup soda pop, not diet.  1 cup milk.  5 to 6 hard candies.  Recheck blood sugar after 15 minutes. Repeat until it is at the right level.  Eat a snack if it is more than 1 hour until the next meal.  Only take medicine as told by your doctor.  Do not skip meals. Eat on time.  Do not drink alcohol except with meals.  Check your blood glucose before driving.  Check your blood glucose before and after exercise.  Always carry treatment with you, such as glucose pills.  Always wear a medical alert bracelet if you have diabetes. GET HELP RIGHT AWAY IF:   Your blood glucose goes  below 70 mg/dl or as told by your doctor, and you:  Are confused.  Are not able to swallow.  Pass out (faint).  You cannot treat yourself. You may need someone to help you.  You have low blood sugar problems often.  You have problems from your medicines.  You are not feeling better after 3 to 4 days.  You have vision changes. MAKE SURE YOU:   Understand these instructions.  Will watch this condition.  Will get help right away if you are not doing well or get worse. Document Released: 07/27/2009 Document Revised: 07/25/2011 Document Reviewed: 07/27/2009 [facility] Patient Information 2014 [place], [place].

## 2013-08-26 NOTE — Telephone Encounter (Signed)
Pt will come in today at 2:30 for evaluation.

## 2013-08-26 NOTE — Telephone Encounter (Signed)
Call from [facility] with [facility] [phone]  Nurse reports pt was real sleepy, she will wake up but falls back asleep.  BS was 371, rechecked during visit and BS 427.  She did take levemir  35 units last night.  Next appointment on 4/16.  Hospital  d/c   Called Nurse back and she did state pt was very sleepy, patient was able to get up and go to Bathroom but sleepy. She has no correctional insulin just the Levemir.  I called pt and she is able to keep conversation without problems.  I asked her to come to clinc today but she has an appointment with Psychiatrist at 3:00.   I asked her to reschedule that appointment and call me back.

## 2013-08-27 ENCOUNTER — Encounter: Payer: Self-pay | Admitting: *Deleted

## 2013-08-27 DIAGNOSIS — J45909 Unspecified asthma, uncomplicated: Secondary | ICD-10-CM | POA: Insufficient documentation

## 2013-08-27 DIAGNOSIS — J069 Acute upper respiratory infection, unspecified: Secondary | ICD-10-CM | POA: Insufficient documentation

## 2013-08-27 MED ORDER — ALBUTEROL SULFATE HFA 108 (90 BASE) MCG/ACT IN AERS: 2.0000 | INHALATION_SPRAY | Freq: Four times a day (QID) | RESPIRATORY_TRACT | Status: DC | PRN

## 2013-08-27 NOTE — Assessment & Plan Note (Signed)
Euvolemic on physical exam, continues to take her diuretics

## 2013-08-27 NOTE — Assessment & Plan Note (Signed)
She has chronic hypokalemia with K of 3.3-3.5  2/2 to diuretic use (on torsemide) and is on potassium supplementation of Kdur (2 tabs of ) twice daily for a total of 80 mEq daily. She is on spironolactone as well. Ordered STAT BMET which revealed K of 3.0. Her K may have become lower due to current insulin use. During this visit she also received albuterol nebulizer treatment and received supplementation of K due to concerns of worsening hypokalemia with K of 3.0 prior to this neb treatment.  Kdur once during visit Pt to resume Kdur BID at home Recheck K level during next visit, pt may need more K supplementation

## 2013-08-27 NOTE — Assessment & Plan Note (Signed)
Lab Results  Component Value Date   HGBA1C 13.0* 08/20/2013   HGBA1C 6.7 09/24/2012   HGBA1C 7.3* 07/05/2010     Assessment: Diabetes control:  Uncontrolled Progress toward A1C goal:   Not at goal Comments: She has newly diagnosed DM2 (last week, requiring hospitalization for CBG>700 and HgA1C of 13%). She was started on lantus while in the hospital but could not use this insulin due to cost. She has been started on Levemir 35 units at bedtime (10PM). She did not bring her CBG meter during this visit but her son-in-law has been helping checking her BS and reports values of low 300s before breakfast and in 400s at bedtime with no value <200s or 80s. She checks her CBG 4 times per day before meals. She has CKD stage 3 with metformin contraindicated. She has not had meal coverage insulin at home due to compliance concerns.    Plan: Medications:  After meeting with Norm Parcel the patient agrees to increase her Levemir dose. Will increase it to 50 units at bedtime. She will return in 1-2 weeks. She may need Novolog 70/30 (25 units in am and 20 units at bedtime)  Home glucose monitoring: Frequency:   Timing:   Instruction/counseling given: reminded to get eye exam, reminded to bring blood glucose meter & log to each visit, reminded to bring medications to each visit and discussed foot care Educational resources provided:   Self management tools provided:   Other plans: Patient to meet with diabetes educator this week, pt to follow up in 1-2 weeks.

## 2013-08-27 NOTE — Progress Notes (Signed)
   Subjective:    Patient ID: [name], female    DOB: 11/05/1960, 53 y.o.   MRN: [id number]  Diabetes Hypoglycemia symptoms include headaches. Pertinent negatives for hypoglycemia include no dizziness or pallor. Associated symptoms include fatigue. Pertinent negatives for diabetes include no chest pain and no weakness.   [name] is a 53 yr old woman with PMH significant for CKD 3, severe obesity, dCHF, chronic venous insufficiency, and newly diagnosed DM2, who presents accompanied by [name] and Tasha granddaughter, for diabetes follow up and for evaluation of URI symptoms. She states that for the past week she has had nasal congestion, rhinorrhea with clear discharge, eye itching,and a cough productive of scant white sputum. She occasionally has had a frontal headache that improves with Tylenol. She denies fever, chills, ear ache, sorethroat, decreased hearing, body aches, or sick contacts.  As far as Tasha diabetes, she continues to have BS in the 300s range before breakfast and in the 400s range after dinner/before bedtime. She continues to use Levemir 35 units at 10PM. Because she was in the rush coming in to this appointment she forgot to bring Tasha CBC meter. She denies hypoglycemic symptoms.   Review of Systems  Constitutional: Positive for fatigue. Negative for fever, chills, diaphoresis, activity change, appetite change and unexpected weight change.  HENT: Positive for congestion and rhinorrhea. Negative for dental problem, ear discharge, ear pain, hearing loss, nosebleeds, postnasal drip, sinus pressure, sneezing and sore throat.   Eyes: Positive for itching. Negative for pain, discharge and visual disturbance.  Respiratory: Positive for cough and shortness of breath. Negative for wheezing.   Cardiovascular: Negative for chest pain, palpitations and leg swelling.  Gastrointestinal: Negative for nausea, vomiting, abdominal pain, diarrhea and constipation.  Genitourinary:  Negative for dysuria and frequency.  Musculoskeletal: Negative for myalgias and neck stiffness.  Skin: Negative for color change, pallor, rash and wound.  Neurological: Positive for headaches. Negative for dizziness, syncope, weakness and light-headedness.  Psychiatric/Behavioral: Negative for behavioral problems and agitation.       Objective:   Physical Exam  Nursing note and vitals reviewed. Constitutional: She is oriented to person, place, and time. No distress.  Obese, tired appearing   HENT:  Right Ear: External ear normal.  Left Ear: External ear normal.  Mouth/Throat: Oropharynx is clear and moist. No oropharyngeal exudate.  Bilateral nasal turbinates pale and swollen with clear discharge.  Frontal sinus tenderness  Eyes: Conjunctivae are normal. Right eye exhibits no discharge. Left eye exhibits no discharge. No scleral icterus.  Cardiovascular: Normal rate and regular rhythm.   Pulmonary/Chest: Effort normal. No stridor. No respiratory distress. She has wheezes. She has no rales. She exhibits no tenderness.  Expiratory wheezes heard over the bibasilar and anterior lung fields   Abdominal: Soft. There is no tenderness.  Musculoskeletal: She exhibits no edema and no tenderness.  Lymphadenopathy:    She has no cervical adenopathy.  Neurological: She is alert and oriented to person, place, and time.  Skin: Skin is warm and dry. No rash noted. She is not diaphoretic. No erythema. No pallor.  Psychiatric: She has a normal mood and affect.          Assessment & Plan:

## 2013-08-27 NOTE — Assessment & Plan Note (Signed)
Patient has smoking history though she quit in 2010. No PFTs on file. States that she has had wheezing in the past. She had wheezing on exam and received an albuterol nebulizer treatment with improvement of her wheezing and shortness of breath.  Rx albuterol inhaler 2 puffs q6hr PRN for wheezing (changed this to Proair due to insurance coverage) Pt may need PFTs once her URI symptoms resolve

## 2013-08-27 NOTE — Progress Notes (Unsigned)
Patient ID: [name], female   DOB: [birth year], 53 y.o.   MRN: [id number] Pharmacy calls and ask if albuterol can be changed to pro-air, insurance only pays for proair? Please send a new script if this is ok to [facility] at [place]

## 2013-08-27 NOTE — Assessment & Plan Note (Signed)
Creatinine stable at 1.60, her baseline.

## 2013-08-27 NOTE — Assessment & Plan Note (Signed)
She has rhinorrhea with clear discharge, itching eyes, and cough productive sputum. Etiology likely viral v allergic cause.  -Rx Allegra 30mg  BID (rennaly dosed) -Rx saline nasal spray 4 times daily -Mucinex for cough

## 2013-08-27 NOTE — Progress Notes (Signed)
Called it in.   Thanks!   SK

## 2013-08-28 ENCOUNTER — Other Ambulatory Visit (HOSPITAL_COMMUNITY): Payer: Self-pay | Admitting: Internal Medicine

## 2013-08-28 NOTE — Progress Notes (Signed)
INTERNAL MEDICINE TEACHING ATTENDING ADDENDUM - Tasha Strum, MD: I reviewed and discussed at the time of visit with the resident Dr. Kennerly, the patient's medical history, physical examination, diagnosis and results of tests and treatment and I agree with the patient's care as documented. 

## 2013-08-29 ENCOUNTER — Ambulatory Visit (HOSPITAL_COMMUNITY)
Admission: RE | Admit: 2013-08-29 | Discharge: 2013-08-29 | Disposition: A | Discharge status: Home / Self Care | Payer: Medicare Other | Source: Ambulatory Visit | Attending: Internal Medicine | Admitting: Internal Medicine

## 2013-08-29 ENCOUNTER — Encounter: Payer: Medicare Other | Admitting: Dietician

## 2013-08-29 ENCOUNTER — Ambulatory Visit: Payer: Medicare Other | Admitting: Internal Medicine

## 2013-08-29 VITALS — BP 104/69 | HR 83 | Wt 384.0 lb

## 2013-08-29 DIAGNOSIS — G4733 Obstructive sleep apnea (adult) (pediatric): Secondary | ICD-10-CM | POA: Insufficient documentation

## 2013-08-29 DIAGNOSIS — N183 Chronic kidney disease, stage 3 unspecified: Secondary | ICD-10-CM

## 2013-08-29 DIAGNOSIS — J209 Acute bronchitis, unspecified: Secondary | ICD-10-CM | POA: Insufficient documentation

## 2013-08-29 DIAGNOSIS — I5032 Chronic diastolic (congestive) heart failure: Secondary | ICD-10-CM

## 2013-08-29 DIAGNOSIS — E119 Type 2 diabetes mellitus without complications: Secondary | ICD-10-CM | POA: Insufficient documentation

## 2013-08-29 DIAGNOSIS — D509 Iron deficiency anemia, unspecified: Secondary | ICD-10-CM | POA: Insufficient documentation

## 2013-08-29 MED ORDER — AMOXICILLIN 500 MG PO TABS: 500.0000 mg | ORAL_TABLET | Freq: Two times a day (BID) | ORAL | Status: DC

## 2013-08-29 NOTE — Progress Notes (Signed)
Patient ID: [name], female   DOB: 06/06/1960, 53 y.o.   MRN: [id number] PCP: Dr. Michail Sermon  53 yo with CHF probably secondary to severe MR now s/p MV repair, rheumatoid arthritis, schizophrenia, overdose of seroquel 2013, S/P St Jude ICD 2013, and PUD.  EF initially low, but most recent echo in 1/15 showed EF 50-55%, stable MV repair, RV dilated and hypokinetic but difficult images.   She returns for follow up. Hospitalized earlier this month with newly diagnosed DM2, Hgb A1C 13%. Last week she was started on allegra and muccinex for cough and viral infection. +Orthopnea. SOB with exertions. Denies PND. Complaining of cough and with yellow sputum over the last week. She says cough is getting worse. Drinking > 2 liters per day. Taking all medications.    Labs (11/14): K 4.1 Creatinine 1.96  Labs (1/15): K 3.2, creatinine 2.28 Labs (2/15): K 3.6, creatinine 2.05 Labs (08/26/13): K 3.0 Creatinine 1.6  Allergies:  1) ! * Colchicine  2) ! Morphine   Past History:  1. Congestive heart failure, most likely secondary to severe mitral regurgitation. TEE (6/10): EF 40%, diffuse hypokinesis, mild to moderately dilated LV, severe eccentric MR, small PFO. Cardiac MRI (6/10): EF 37% with global hypokinesis, moderate to severe MR, no delayed enhancement (no evidence for sarcoidosis or other infiltrative disease). TTE (10/10) after MV repair: EF 25-30%, moderately dilated LV, moderate diastolic dysfunction, mildly depressed RV function, trivial MR, MV mean gradient 5 mmHg, PASP 30 mmHg. RHC (12/10): mean RA 19, PA 46/26, mean PCWP 28, CI 2.5, SVO2 63%. TTE (2/11): EF 35-40% with diffuse hypokinesis, no significant MR or MS. TTE (7/11): EF 45%, mild global hypokinesis, s/p MV repair, no mitral regurgitation, minimal mitral stenosis, PA systolic pressure 40 mmHg, mild LV dilation.  TTE (11/12): EF 30-35%, mild LV dilation, mild LVH, s/p MV repair with no regurgitation and minimal stenosis.  TTE (3/13): EF  25-30%, diffuse hypokinesis, no significant mitral stenosis by pressure halftime with mean gradient 7 mmHg across valve, no MR.  Echo (6/13): EF 30-35%, mildly dilated LV, mild mitral stenosis but no MR.  St Jude ICD placed in 8/13. RHC (5/14): mean RA 10, PA 23/10, mean PCWP 17, CI 2.17.  Echo (11/14) with EF 40%, stable repaired MV, mild to moderately dilated RV with mildly decreased systolic function.  Echo (1/15) with EF 50-55%, s/p MV repair with no MS or significant MR, RV poorly visualized but appears hypokinetic and dilated.  2. Severe mitral regurgitation (see above). Minimally invasive mitral valve repair on 12/18/08. She additionally had TV repair and PFO closure.  3. Microcytic anemia: EGD and colonoscopy in 12/11 did not reveal source of bleeding.  4. H/o gastric ulcers.  5. Morbid obesity.  6. Obstructive sleep apnea, on CPAP.  7. GERD.  8. Hypertension, controlled.  9. Rheumatoid arthritis  10. Gout.  11. Depression.  12. LHC (6/10): No angiographic CAD. Mildly dilated LV. 3+ MR. EF 40%.  13. Prior smoking, quit  14. Colitis (2/11)  15. Schizophrenia versus schizoaffective disorder 16. H/o DVT: On Xarelto 17. Syncope while coughing.  24. DM  Family History:  Negative for cardiac or renal disease.  No FH of Colon Cancer  Social History:  Single, 5 children. Unemployed.  Tobacco Use - Yes. Smoked < 1 ppd, quit 6/10.  Alcohol Use - no  Regular Exercise - no  Drug Use - no, hx crack-cocaine use  Daily Caffeine Use: 2 daily   Review of Systems  All  systems reviewed and negative except as per HPI.  Current Outpatient Prescriptions  Medication Sig Dispense Refill  . albuterol (PROAIR HFA) 108 (90 BASE) MCG/ACT inhaler Inhale 2 puffs into the lungs every 6 (six) hours as needed for wheezing or shortness of breath.  6.7 g  2  . amitriptyline (ELAVIL) 150 MG tablet Take 1 tablet (150 mg total) by mouth at bedtime.  30 tablet  1  . benzonatate (TESSALON) 100 MG capsule Take 1  capsule (100 mg total) by mouth 2 (two) times daily as needed for cough.  20 capsule  0  . carvedilol (COREG) 12.5 MG tablet Take 1.5 tablets (18.75 mg total) by mouth 2 (two) times daily with a meal.  90 tablet  3  . dextromethorphan-guaiFENesin (MUCINEX DM) 30-600 MG per 12 hr tablet Take 1 tablet by mouth 2 (two) times daily.  20 tablet  0  . diclofenac sodium (VOLTAREN) 1 % GEL Apply 4 g topically 4 (four) times daily.  1 Tube  3  . docusate sodium (COLACE) 100 MG capsule Take 200 mg by mouth daily.       . Febuxostat 80 MG TABS Take 80 mg by mouth daily.       . ferrous sulfate 325 (65 FE) MG tablet Take 1 tablet (325 mg total) by mouth 2 (two) times daily with a meal.  180 tablet  1  . fexofenadine (ALLEGRA) 30 MG tablet Take 1 tablet (30 mg total) by mouth 2 (two) times daily.  60 tablet  2  . glucose blood (FREESTYLE TEST STRIPS) test strip 1 each by Other route 4 (four) times daily -  before meals and at bedtime. Use as instructed  100 each  12  . glucose monitoring kit (FREESTYLE) monitoring kit 1 each by Does not apply route 4 (four) times daily -  before meals and at bedtime. Dx code 250.00  1 each  0  . hydrALAZINE (APRESOLINE) 50 MG tablet Take 1 tablet (50 mg total) by mouth 3 (three) times daily.  90 tablet  6  . HYDROcodone-acetaminophen (NORCO) 10-325 MG per tablet Take 1 tablet by mouth every 6 (six) hours as needed for severe pain.  30 tablet  0  . Insulin Detemir (LEVEMIR) 100 UNIT/ML Pen Inject 35 Units into the skin daily at 10 pm.  45 mL  11  . Insulin Pen Needle 32G X 4 MM MISC Please use 1 needle at night to administer insulin via Lantus pen  30 each  11  . isosorbide mononitrate (IMDUR) 60 MG 24 hr tablet Take 90 mg by mouth daily.      Marland Kitchen KLOR-CON M20 20 MEQ tablet TAKE TWO TABLETS BY MOUTH TWICE DAILY  120 tablet  3  . Lancets (FREESTYLE) lancets 1 each by Other route 4 (four) times daily -  before meals and at bedtime. Use as instructed  100 each  12  . metolazone  (ZAROXOLYN) 5 MG tablet Take 1 tablet (5 mg total) by mouth 4 (four) times a week. Monday,Wednesday,Friday and Saturday.  30 minutes before torsemide  20 tablet  3  . omeprazole (PRILOSEC) 40 MG capsule Take 1 capsule (40 mg total) by mouth daily.  30 capsule  3  . polyethylene glycol (MIRALAX / GLYCOLAX) packet Take 17 g by mouth daily as needed for moderate constipation.      . Rivaroxaban (XARELTO) 20 MG TABS tablet Take 20 mg by mouth every morning.      . sodium chloride (  OCEAN) 0.65 % SOLN nasal spray Place 1 spray into both nostrils 4 (four) times a week.  30 mL  2  . spironolactone (ALDACTONE) 25 MG tablet Take 25 mg by mouth daily.      Marland Kitchen torsemide (DEMADEX) 100 MG tablet Take 1 tablet (100 mg total) by mouth 2 (two) times daily.  180 tablet  3  . amoxicillin (AMOXIL) 500 MG tablet Take 1 tablet (500 mg total) by mouth 2 (two) times daily.  14 tablet  0  . [DISCONTINUED] QUEtiapine (SEROQUEL) 100 MG tablet Take 400 mg by mouth at bedtime.        No current facility-administered medications for this encounter.   Filed Vitals:   08/29/13 1511  BP: 104/69  Pulse: 83  Weight: 384 lb (174.181 kg)  SpO2: 99%   General: NAD, obese. Sitting in WC Neck: Thick neck, JVP difficult to assess d/t body habitus but does not appear elevated, no thyromegaly or thyroid nodule.  Lungs:LLL rhonchi. Decreased in the bases CV: Nondisplaced PMI.  Heart regular S1/S2, no S3/S4, no murmur.   No carotid bruit.  Normal pedal pulses.  Abdomen: Soft, markedly obese. Nontender, mild distention.  Neurologic: Alert and oriented x 3. Moves all 4 without difficulty Psych: Normal affect. Extremities: No clubbing or cyanosis. No lower extremity edema.   Assessment/Plan 1. Chronic diastolic CHF: NICM with EF improved to 50-55%.  Per Dr Aundra Dubin -->suspect that she has significant RV failure. St Jude ICD.  NYHA class III symptoms (chronic, stable).  Limited mobility due to knee and obesity.   - Continue torsemide 100  mg bid and metolazone 2.5 mg to 4 times weekly prior to am torsemide.    - Continue current Coreg, imdur, hydralazine and spironolactone - She is not on Ace or Arb due to elevated creatinine  2. CKD: reviewed BMET from 08/26/13. Renal function.   3. Morbid obesity: Weight loss is imperative.  She saw Dr Redmond Pulling for bariatric surgery evaluation.  She is undergoing the pre-operative workup.  Per Dr Aundra Dubin ok for  surgery.  .  4. DVT: Suspected DVT 11/14 admission.  She is on Xarelto, would probably continue long-term given her inactivity.  5. Mitral valve repair: Stable on 1/15 echo.  6. Acute Bronchitis- Start 500 mg amoxicillin twice a day for 7 days.  7. Uncontrolled DM - per PCP  Follow up in 4 weeks  [name] 3:41 PM   [name] 08/29/2013

## 2013-08-29 NOTE — Patient Instructions (Signed)
Follow up in  4 weeks with Dr Shirlee Latch    Take amoxicillin 500 mg twice a day   Do the following things EVERYDAY: 1) Weigh yourself in the morning before breakfast. Write it down and keep it in a log. 2) Take your medicines as prescribed 3) Eat low salt foods-Limit salt (sodium) to 2000 mg per day.  4) Stay as active as you can everyday 5) Limit all fluids for the day to less than 2 liters

## 2013-08-30 ENCOUNTER — Other Ambulatory Visit: Payer: Self-pay | Admitting: *Deleted

## 2013-08-30 DIAGNOSIS — M25561 Pain in right knee: Secondary | ICD-10-CM

## 2013-08-30 DIAGNOSIS — M1710 Unilateral primary osteoarthritis, unspecified knee: Secondary | ICD-10-CM

## 2013-08-30 DIAGNOSIS — M25562 Pain in left knee: Principal | ICD-10-CM

## 2013-08-30 NOTE — Telephone Encounter (Signed)
Pt received Norco # 30 on 4/9 while in the ED  Last refill in clinic on 07/18/13 Norco # 120   She wants refill before the week end. Pt # [id number]

## 2013-09-02 ENCOUNTER — Other Ambulatory Visit: Payer: Self-pay | Admitting: Internal Medicine

## 2013-09-02 DIAGNOSIS — M1710 Unilateral primary osteoarthritis, unspecified knee: Secondary | ICD-10-CM

## 2013-09-02 DIAGNOSIS — M25562 Pain in left knee: Principal | ICD-10-CM

## 2013-09-02 DIAGNOSIS — M25561 Pain in right knee: Secondary | ICD-10-CM

## 2013-09-02 MED ORDER — HYDROCODONE-ACETAMINOPHEN 10-325 MG PO TABS: 1.0000 | ORAL_TABLET | Freq: Four times a day (QID) | ORAL | Status: DC | PRN

## 2013-09-09 ENCOUNTER — Ambulatory Visit (INDEPENDENT_AMBULATORY_CARE_PROVIDER_SITE_OTHER): Payer: Medicare Other | Admitting: Internal Medicine

## 2013-09-09 ENCOUNTER — Encounter: Payer: Self-pay | Admitting: Internal Medicine

## 2013-09-09 VITALS — BP 92/60 | HR 80 | Temp 98.0°F | Ht 66.0 in | Wt 377.2 lb

## 2013-09-09 DIAGNOSIS — L304 Erythema intertrigo: Secondary | ICD-10-CM | POA: Insufficient documentation

## 2013-09-09 DIAGNOSIS — E876 Hypokalemia: Secondary | ICD-10-CM

## 2013-09-09 DIAGNOSIS — M25569 Pain in unspecified knee: Secondary | ICD-10-CM

## 2013-09-09 DIAGNOSIS — M25562 Pain in left knee: Principal | ICD-10-CM

## 2013-09-09 DIAGNOSIS — M25561 Pain in right knee: Secondary | ICD-10-CM

## 2013-09-09 DIAGNOSIS — M1710 Unilateral primary osteoarthritis, unspecified knee: Secondary | ICD-10-CM

## 2013-09-09 DIAGNOSIS — G8929 Other chronic pain: Secondary | ICD-10-CM

## 2013-09-09 DIAGNOSIS — E119 Type 2 diabetes mellitus without complications: Secondary | ICD-10-CM

## 2013-09-09 DIAGNOSIS — L538 Other specified erythematous conditions: Secondary | ICD-10-CM

## 2013-09-09 DIAGNOSIS — I10 Essential (primary) hypertension: Secondary | ICD-10-CM

## 2013-09-09 LAB — GLUCOSE, CAPILLARY: Glucose-Capillary: 109 mg/dL — ABNORMAL HIGH (ref 70–99)

## 2013-09-09 LAB — HM DIABETES EYE EXAM

## 2013-09-09 MED ORDER — HYDROCODONE-ACETAMINOPHEN 10-325 MG PO TABS: 1.0000 | ORAL_TABLET | Freq: Four times a day (QID) | ORAL | Status: DC | PRN

## 2013-09-09 MED ORDER — NYSTATIN 100000 UNIT/GM EX POWD: Freq: Four times a day (QID) | CUTANEOUS | Status: DC

## 2013-09-09 NOTE — Progress Notes (Signed)
   Subjective:    Patient ID: [name], female    DOB: 12/09/1960, 53 y.o.   MRN: [id number]  HPI  Presents for DM f/u after titration of insulin from 35 units to 50 units qd.  Pt was to return with glucometer readings but does not have them today.  Also noted to be hypokalemia on prior evaluation and started on supplementation. Hx significant for chronic diastolic heart failure, severe morbid obesity, wheelchair bound secondary to weight and bilateral knee joint disease, rheumatoid arthritis and schizoaffective disorder.  Review of Systems  Constitutional: Negative.   HENT: Negative.   Eyes: Negative.   Respiratory: Negative.   Cardiovascular: Negative.   Gastrointestinal: Negative.   Endocrine: Positive for polyuria.  Genitourinary: Negative for dysuria.  Musculoskeletal: Positive for arthralgias, gait problem and joint swelling.  Skin: Positive for rash. Negative for wound.       Rash and itchy under breast and panus  Neurological: Negative.   Hematological: Negative.   Psychiatric/Behavioral: Negative.        Objective:   Physical Exam  Constitutional: She is oriented to person, place, and time. She appears well-developed and well-nourished. No distress.  HENT:  Head: Normocephalic and atraumatic.  Eyes: Conjunctivae and EOM are normal. Pupils are equal, round, and reactive to light.  Neck: Normal range of motion. Neck supple. No thyromegaly present.  Cardiovascular: Normal rate, regular rhythm and normal heart sounds.   Pulmonary/Chest: Effort normal and breath sounds normal. She has no wheezes.  Abdominal: Soft. Bowel sounds are normal.  obese  Musculoskeletal: She exhibits no tenderness.  Trace LE edema bilaterally  Neurological: She is alert and oriented to person, place, and time.  Skin: Skin is warm and dry. Rash noted.  Psychiatric: She has a normal mood and affect. Her behavior is normal. Judgment and thought content normal.          Assessment &  Plan:  See problem-list for details.  1. DM : poor control, continue current regimen, pt to return with glucometer readings -retinal scan today  2. Hypokalemia: check K today  3. Chronic knee pain: refilled hydrocodone  4. Intertrigo: Mystatin powder

## 2013-09-09 NOTE — Assessment & Plan Note (Signed)
Reports compliance with Klor-con 40 mEq qd -Check BMET today

## 2013-09-09 NOTE — Assessment & Plan Note (Addendum)
Lab Results  Component Value Date   HGBA1C 13.0* 08/20/2013   HGBA1C 6.7 09/24/2012   HGBA1C 7.3* 07/05/2010     Assessment: Diabetes control: poor control (HgbA1C >9%) Progress toward A1C goal:  unable to assess Comments: didn't bring meter  Plan: Medications:  continue current medications Home glucose monitoring: Frequency: once a day Timing: before breakfast Instruction/counseling given: reminded to get eye exam, reminded to bring blood glucose meter & log to each visit and discussed foot care Educational resources provided: brochure Self management tools provided:   Other plans: retinal scan today, cont Levemir 50 units qd, pt to f/u with glucometer readings for titration

## 2013-09-09 NOTE — Patient Instructions (Signed)
General Instructions:  We will re-check you potassium level today. We have refilled your pain medication for your knees. We have prescribed a powder for your rash under your skin folds. Please return with you meter on your next visit. For now, continue to take 50 Units of the insulin daily. Please bring your medicines with you each time you come.   Medicines may be  Eye drops  Herbal   Vitamins  Pills  Seeing these help [place] take care of you.    Treatment Goals:  Goals (1 Years of Data) as of 09/09/13         As of Today 08/29/13 08/26/13 08/23/13 08/23/13     Blood Pressure    . Blood Pressure < 140/90  92/60 104/69 118/76 124/86 113/78     Lifestyle    . Prevent Falls            Progress Toward Treatment Goals:  Treatment Goal 09/09/2013  Hemoglobin A1C unable to assess  Blood pressure at goal  Prevent falls at goal    Self Care Goals & Plans:  Self Care Goal 09/09/2013  Manage my medications take my medicines as prescribed; bring my medications to every visit; refill my medications on time  Monitor my health -  Eat healthy foods drink diet soda or water instead of juice or soda; eat more vegetables; eat foods that are low in salt; eat baked foods instead of fried foods; eat fruit for snacks and desserts  Be physically active -  Meeting treatment goals -    Home Blood Glucose Monitoring 09/09/2013  Check my blood sugar once a day  When to check my blood sugar before breakfast     Care Management & Community Referrals:  Referral 01/18/2013  Referrals made for care management support none needed  Referrals made to community resources none

## 2013-09-09 NOTE — Assessment & Plan Note (Signed)
Likely Candidal intertrigo between abdominal panus. Recommend to keep dry and may use Nystatin powder.

## 2013-09-09 NOTE — Assessment & Plan Note (Signed)
Refilled hydrocodone 10-325 mg q6h prn, #120

## 2013-09-10 ENCOUNTER — Other Ambulatory Visit: Payer: Self-pay | Admitting: Dietician

## 2013-09-10 DIAGNOSIS — E119 Type 2 diabetes mellitus without complications: Secondary | ICD-10-CM

## 2013-09-10 NOTE — Progress Notes (Signed)
INTERNAL MEDICINE TEACHING ATTENDING ADDENDUM - Zalea Pete, MD: I reviewed and discussed at the time of visit with the resident Dr. Schooler, the patient's medical history, physical examination, diagnosis and results of tests and treatment and I agree with the patient's care as documented. 

## 2013-09-11 ENCOUNTER — Encounter: Payer: Self-pay | Admitting: Pulmonary Disease

## 2013-09-11 ENCOUNTER — Ambulatory Visit (INDEPENDENT_AMBULATORY_CARE_PROVIDER_SITE_OTHER): Payer: Medicare Other | Admitting: Pulmonary Disease

## 2013-09-11 VITALS — BP 122/88 | HR 86 | Temp 98.1°F | Ht 66.0 in | Wt 381.2 lb

## 2013-09-11 DIAGNOSIS — G4733 Obstructive sleep apnea (adult) (pediatric): Secondary | ICD-10-CM

## 2013-09-11 NOTE — Assessment & Plan Note (Signed)
The patient is wearing CPAP compliantly by her download, but I've asked her to try and use at least 6 hours on a more consistent basis. Her apnea appears well-controlled from her download, and the patient states that she is gradually getting more used to the machine. She does feel that it has helped her sleep and daytime alertness. Finally, I have encouraged her to work aggressively on weight loss, and to keep up with her mask changes and supplies.

## 2013-09-11 NOTE — Progress Notes (Signed)
   Subjective:    Patient ID: [name], female    DOB: 03-Mar-1961, 53 y.o.   MRN: [id number]  HPI The patient comes in today for followup of her obstructive sleep apnea. She is wearing CPAP compliantly, and is having no issues with her mask fit or pressure. She does get anxious sometimes and this can limit her total sleep time with the device, but it is improving. Her download shows excellent compliance, and she is getting between 3 and 6 hours a night on the machine. Her apnea appears to be well-controlled on the download.   Review of Systems  Constitutional: Negative for fever and unexpected weight change.  HENT: Negative for congestion, dental problem, ear pain, nosebleeds, postnasal drip, rhinorrhea, sinus pressure, sneezing, sore throat and trouble swallowing.   Eyes: Negative for redness and itching.  Respiratory: Negative for cough, chest tightness, shortness of breath and wheezing.   Cardiovascular: Negative for palpitations and leg swelling.  Gastrointestinal: Negative for nausea and vomiting.  Genitourinary: Negative for dysuria.  Musculoskeletal: Negative for joint swelling.  Skin: Negative for rash.  Neurological: Negative for headaches.  Hematological: Does not bruise/bleed easily.  Psychiatric/Behavioral: Negative for dysphoric mood. The patient is nervous/anxious.        Objective:   Physical Exam Morbidly obese female in no acute distress Nose without purulence or discharge noted No skin breakdown or pressure necrosis from the CPAP mask Neck without lymphadenopathy or thyromegaly Lower extremities with 2+ edema, no cyanosis Alert and oriented, moves all 4 extremities.       Assessment & Plan:

## 2013-09-11 NOTE — Patient Instructions (Signed)
Continue with cpap, and try to wear at least 6 hrs everynight. Work on weight loss. followup with me again in 55mos.  Please call if having issues with your machine.

## 2013-09-16 ENCOUNTER — Telehealth: Payer: Self-pay | Admitting: *Deleted

## 2013-09-16 NOTE — Telephone Encounter (Signed)
Call from [facility] with [facility] - #  [phone] [name] sees pt in the home and states pt is c/o burning with urination, frequency and odor to urine. Onset about one week with frequency.  Pt is home bound  And nurse is seeing pt today.  I gave order for U/A with culture  Is this okay with you?

## 2013-09-16 NOTE — Telephone Encounter (Signed)
Yes, thanks. We will review the results and prescribe accordingly.

## 2013-09-17 ENCOUNTER — Encounter: Payer: Medicare Other | Attending: General Surgery | Admitting: Dietician

## 2013-09-17 DIAGNOSIS — Z713 Dietary counseling and surveillance: Secondary | ICD-10-CM | POA: Insufficient documentation

## 2013-09-17 DIAGNOSIS — R7309 Other abnormal glucose: Secondary | ICD-10-CM | POA: Insufficient documentation

## 2013-09-17 NOTE — Patient Instructions (Signed)
-  Continue to limit sweets and portion sizes -Adhere to fluid restriction -Gradually increase chair exercises 

## 2013-09-17 NOTE — Telephone Encounter (Signed)
Received Urinalysis report from [facility] and shown to Dr Dalphine Handing.   I talked with Beth, RN with [facility] and  advised her to tell pt to drink fluids and make clinic appointment if burning and frequency do not improve.  U/A looks okay. Call back for any changes. Will scan report into chart.

## 2013-09-17 NOTE — Progress Notes (Signed)
  Medical Nutrition Therapy:  Appt start time: 300 end time:  315.  Follow-up: Dwayne returns today for her 2nd SWL visit in preparation for gastric sleeve surgery. She has lost 15 pounds since her last visit and has made several positive changes to her lifestyle by avoiding sweets, having smaller portions, and walking much more than before. She has been working with a physical therapist. She reports she is complying with her fluid restriction. Shanetta was hospitalized recently for hyperglycemia and was diagnosed with diabetes.   Preferred Learning Style:  No preference indicated   Learning Readiness:  Ready  MEDICATIONS: see list   DIETARY INTAKE:  24-hr recall:  B ( AM): flavored oatmeal  Snk ( AM): none  L ( PM): leftovers from the night before Snk ( PM): none D ( PM): fish, potatoes, green beans Snk ( PM): apples and vinegar  Beverages: Crystal Light, ice  Usual physical activity: chair exercises every other day  Estimated energy needs: 1600 calories  Progress Towards Goal(s):  Some progress.   Nutritional Diagnosis:  Plains-3.3 Overweight/obesity related to past poor dietary habits and physical inactivity as evidenced by patient w/ pending Gastric Sleeve surgery following dietary guidelines for continued weight loss.     Intervention:  Nutrition counseling provided. Reviewed pre-op goals and protein shakes per patient request.  Teaching Method Utilized: Visual Auditory   Barriers to learning/adherence to lifestyle change: physical disability  Demonstrated degree of understanding via:  Teach Back   Monitoring/Evaluation:  Dietary intake, exercise, and body weight in 4 week(s).

## 2013-09-18 ENCOUNTER — Telehealth (HOSPITAL_COMMUNITY): Payer: Self-pay

## 2013-09-18 ENCOUNTER — Other Ambulatory Visit: Payer: Self-pay | Admitting: Internal Medicine

## 2013-09-18 ENCOUNTER — Telehealth: Payer: Self-pay | Admitting: *Deleted

## 2013-09-18 DIAGNOSIS — I509 Heart failure, unspecified: Secondary | ICD-10-CM

## 2013-09-18 DIAGNOSIS — I5043 Acute on chronic combined systolic (congestive) and diastolic (congestive) heart failure: Secondary | ICD-10-CM

## 2013-09-18 DIAGNOSIS — E119 Type 2 diabetes mellitus without complications: Secondary | ICD-10-CM

## 2013-09-18 DIAGNOSIS — I129 Hypertensive chronic kidney disease with stage 1 through stage 4 chronic kidney disease, or unspecified chronic kidney disease: Secondary | ICD-10-CM

## 2013-09-18 DIAGNOSIS — N390 Urinary tract infection, site not specified: Secondary | ICD-10-CM

## 2013-09-18 MED ORDER — AMOXICILLIN-POT CLAVULANATE 875-125 MG PO TABS: 1.0000 | ORAL_TABLET | Freq: Two times a day (BID) | ORAL | Status: DC

## 2013-09-18 NOTE — Telephone Encounter (Signed)
Susie with paramedicine called concerning patient's BLEE during routine visit at home.  Patient has no other s/s of CHF exacerbation.  Weight stable, no SOB, no cough.  Instructed to have patient take extra metolazone today and revisit patient tomorrow to check on fluid status.  Aware and agreeable.  Patient reminded fo upcoming appointment on the 13th of this month. [name]

## 2013-09-18 NOTE — Telephone Encounter (Signed)
Message left from 09/17/13 from [facility] with [facility]  about urine culture - shows strept. Return call 09/18/13 [phone] - left message - needs copy of urine culture results - ? If  pt was having any symptoms. Stanton Kidney Tommy Goostree RN 09/18/13 9:34AM

## 2013-09-18 NOTE — Progress Notes (Signed)
Received information that the patient has been suffering from increasing dysuria. The initial UA done did not show infection, however culture shows >=100,000 colonies of s.agalactiae, which should be sensitive to penicillin. I tried to call the patient at the number given in the chart, however, I could not reach her. I will prescribe her Augmentin 875 BID for 5 days assuming that she is not having complicated infection with systemic symptoms like fever, dizziness, low blood pressure, severe lower abdominal pain and flank pain. I will request RN Stanton Kidney Ditzler to get in touch with the patient if possible and enquire about these symptoms. If she has the severe symptoms, then she needs to be seen in clinic or ER. I have reviewed her allergies and she does not have any allergy to penicillin listed in the chart.

## 2013-09-19 ENCOUNTER — Telehealth: Payer: Self-pay | Admitting: *Deleted

## 2013-09-19 NOTE — Telephone Encounter (Signed)
[name], [name] had called and left a message that she nor [name] had heard back about urine culture, i called her and explained dr Dalphine Handing had tried to call [name] and did not get an answer, that med had been sent to [facility], [address] [place] and [name] needed to pick up asap. At this time she also states that [name] has a cough now w/ pale blood tinged sputum, she is advised to contact cardiology just as a FYI, she is agreeable and will do so now. [name] was notified and made aware of abx and need to pick up, she stated that [facility] had called too.

## 2013-09-20 NOTE — Telephone Encounter (Signed)
Talked with [facility] and told her I left message on pt home phone recording to call clinic about urine culture and antibiotics - never heard from pt. Called pt again this AM 09/20/13 9AM - N/A.  Beth will check with pt about starting antibiotics. Stanton Kidney Saliha Salts RN 09/20/13 9AM

## 2013-09-23 NOTE — Progress Notes (Signed)
Patient ID: [name], female   DOB: 1960/10/21, 53 y.o.   MRN: [id number] PCP: Dr. Michail Sermon  53 yo with CHF probably secondary to severe MR now s/p MV repair, rheumatoid arthritis, schizophrenia, overdose of seroquel 2013, S/P St Jude ICD 2013, and PUD.  EF initially low, but most recent echo in 1/15 showed EF 50-55%, stable MV repair, RV dilated and hypokinetic but difficult images. Newly diagnosed DM2, Hgb A1C 13% in April.   She returns for follow up with her daughter. She has lost 20 pounds in the last 1 month. Feeling much better. Says she stopped drinking soda and bread. Able  to walk more without her walker. Weight at home trending down from 390 to 361pounds. Complaint with medications. No bleeding problems. Still on waiting list for gastric bypass. Drinking > 2 liters per day. Taking all medications. Followed by paramedicine.    Labs (11/14): K 4.1 Creatinine 1.96  Labs (1/15): K 3.2, creatinine 2.28 Labs (2/15): K 3.6, creatinine 2.05 Labs (08/26/13): K 3.0 Creatinine 1.6  Allergies:  1) ! * Colchicine  2) ! Morphine   Past History:  1. Congestive heart failure, most likely secondary to severe mitral regurgitation. TEE (6/10): EF 40%, diffuse hypokinesis, mild to moderately dilated LV, severe eccentric MR, small PFO. Cardiac MRI (6/10): EF 37% with global hypokinesis, moderate to severe MR, no delayed enhancement (no evidence for sarcoidosis or other infiltrative disease). TTE (10/10) after MV repair: EF 25-30%, moderately dilated LV, moderate diastolic dysfunction, mildly depressed RV function, trivial MR, MV mean gradient 5 mmHg, PASP 30 mmHg. RHC (12/10): mean RA 19, PA 46/26, mean PCWP 28, CI 2.5, SVO2 63%. TTE (2/11): EF 35-40% with diffuse hypokinesis, no significant MR or MS. TTE (7/11): EF 45%, mild global hypokinesis, s/p MV repair, no mitral regurgitation, minimal mitral stenosis, PA systolic pressure 40 mmHg, mild LV dilation.  TTE (11/12): EF 30-35%, mild LV dilation,  mild LVH, s/p MV repair with no regurgitation and minimal stenosis.  TTE (3/13): EF 25-30%, diffuse hypokinesis, no significant mitral stenosis by pressure halftime with mean gradient 7 mmHg across valve, no MR.  Echo (6/13): EF 30-35%, mildly dilated LV, mild mitral stenosis but no MR.  St Jude ICD placed in 8/13. RHC (5/14): mean RA 10, PA 23/10, mean PCWP 17, CI 2.17.  Echo (11/14) with EF 40%, stable repaired MV, mild to moderately dilated RV with mildly decreased systolic function.  Echo (1/15) with EF 50-55%, s/p MV repair with no MS or significant MR, RV poorly visualized but appears hypokinetic and dilated.  2. Severe mitral regurgitation (see above). Minimally invasive mitral valve repair on 12/18/08. She additionally had TV repair and PFO closure.  3. Microcytic anemia: EGD and colonoscopy in 12/11 did not reveal source of bleeding.  4. H/o gastric ulcers.  5. Morbid obesity.  6. Obstructive sleep apnea, on CPAP.  7. GERD.  8. Hypertension, controlled.  9. Rheumatoid arthritis  10. Gout.  11. Depression.  12. LHC (6/10): No angiographic CAD. Mildly dilated LV. 3+ MR. EF 40%.  13. Prior smoking, quit  14. Colitis (2/11)  15. Schizophrenia versus schizoaffective disorder 16. H/o DVT: On Xarelto 17. Syncope while coughing.  54. DM  Family History:  Negative for cardiac or renal disease.  No FH of Colon Cancer  Social History:  Single, 5 children. Unemployed.  Tobacco Use - Yes. Smoked < 1 ppd, quit 6/10.  Alcohol Use - no  Regular Exercise - no  Drug Use - no,  hx crack-cocaine use  Daily Caffeine Use: 2 daily   Review of Systems  All systems reviewed and negative except as per HPI.  Current Outpatient Prescriptions  Medication Sig Dispense Refill  . albuterol (PROAIR HFA) 108 (90 BASE) MCG/ACT inhaler Inhale 2 puffs into the lungs every 6 (six) hours as needed for wheezing or shortness of breath.  6.7 g  2  . amitriptyline (ELAVIL) 150 MG tablet Take 1 tablet (150 mg total)  by mouth at bedtime.  30 tablet  1  . amoxicillin-clavulanate (AUGMENTIN) 875-125 MG per tablet Take 1 tablet by mouth 2 (two) times daily.  10 tablet  0  . benzonatate (TESSALON) 100 MG capsule Take 1 capsule (100 mg total) by mouth 2 (two) times daily as needed for cough.  20 capsule  0  . carvedilol (COREG) 12.5 MG tablet Take 1.5 tablets (18.75 mg total) by mouth 2 (two) times daily with a meal.  90 tablet  3  . dextromethorphan-guaiFENesin (MUCINEX DM) 30-600 MG per 12 hr tablet Take 1 tablet by mouth 2 (two) times daily.  20 tablet  0  . diclofenac sodium (VOLTAREN) 1 % GEL Apply 4 g topically 4 (four) times daily.  1 Tube  3  . docusate sodium (COLACE) 100 MG capsule Take 200 mg by mouth daily.       . Febuxostat 80 MG TABS Take 80 mg by mouth daily.       . ferrous sulfate 325 (65 FE) MG tablet Take 1 tablet (325 mg total) by mouth 2 (two) times daily with a meal.  180 tablet  1  . fexofenadine (ALLEGRA) 30 MG tablet Take 1 tablet (30 mg total) by mouth 2 (two) times daily.  60 tablet  2  . glucose blood (FREESTYLE TEST STRIPS) test strip 1 each by Other route 4 (four) times daily -  before meals and at bedtime. Use as instructed  100 each  12  . glucose monitoring kit (FREESTYLE) monitoring kit 1 each by Does not apply route 4 (four) times daily -  before meals and at bedtime. Dx code 250.00  1 each  0  . hydrALAZINE (APRESOLINE) 50 MG tablet Take 1 tablet (50 mg total) by mouth 3 (three) times daily.  90 tablet  6  . HYDROcodone-acetaminophen (NORCO) 10-325 MG per tablet Take 1 tablet by mouth every 6 (six) hours as needed for severe pain.  30 tablet  0  . HYDROcodone-acetaminophen (NORCO) 10-325 MG per tablet Take 1 tablet by mouth every 6 (six) hours as needed for severe pain.  120 tablet  0  . Insulin Detemir (LEVEMIR) 100 UNIT/ML Pen Inject 35 Units into the skin daily at 10 pm.  45 mL  11  . Insulin Pen Needle 32G X 4 MM MISC Please use 1 needle at night to administer insulin via  Lantus pen  30 each  11  . isosorbide mononitrate (IMDUR) 60 MG 24 hr tablet Take 90 mg by mouth daily.      Marland Kitchen KLOR-CON M20 20 MEQ tablet TAKE TWO TABLETS BY MOUTH TWICE DAILY  120 tablet  3  . Lancets (FREESTYLE) lancets 1 each by Other route 4 (four) times daily -  before meals and at bedtime. Use as instructed  100 each  12  . metolazone (ZAROXOLYN) 5 MG tablet Take 1 tablet (5 mg total) by mouth 4 (four) times a week. Monday,Wednesday,Friday and Saturday.  30 minutes before torsemide  20 tablet  3  .  nystatin (MYCOSTATIN) powder Apply topically 4 (four) times daily.  15 g  0  . omeprazole (PRILOSEC) 40 MG capsule Take 1 capsule (40 mg total) by mouth daily.  30 capsule  3  . polyethylene glycol (MIRALAX / GLYCOLAX) packet Take 17 g by mouth daily as needed for moderate constipation.      . polyethylene glycol (MIRALAX / GLYCOLAX) packet DISSOLVE ONE PACKET IN LIQUID AND DRINK BY MOUTH  ONCE DAILY  14 packet  0  . Rivaroxaban (XARELTO) 20 MG TABS tablet Take 20 mg by mouth every morning.      Marland Kitchen spironolactone (ALDACTONE) 25 MG tablet Take 25 mg by mouth daily.      Marland Kitchen torsemide (DEMADEX) 100 MG tablet Take 1 tablet (100 mg total) by mouth 2 (two) times daily.  180 tablet  3  . [DISCONTINUED] QUEtiapine (SEROQUEL) 100 MG tablet Take 400 mg by mouth at bedtime.        No current facility-administered medications for this encounter.   Filed Vitals:   09/25/13 1501  Pulse: 96  Resp: 20  Weight: 364 lb 2 oz (165.166 kg)  SpO2: 99%   General: NAD, obese. Sitting in WC Neck: Thick neck, JVP difficult to assess d/t body habitus but does not appear elevated, no thyromegaly or thyroid nodule.  Lungs:Clear  CV: Nondisplaced PMI.  Heart regular S1/S2, no S3/S4, no murmur.   No carotid bruit.  Normal pedal pulses.  Abdomen: Soft, markedly obese. Nontender, mild distention.  Neurologic: Alert and oriented x 3. Moves all 4 without difficulty Psych: Normal affect. Extremities: No clubbing or  cyanosis. No lower extremity edema.   Assessment/Plan 1. Chronic diastolic CHF: NICM with EF improved to 50-55%.  Per Dr Aundra Dubin -->suspect that she has significant RV failure. St Jude ICD.  NYHA class III symptoms (chronic, stable).  Has lost 40 pounds over the last month. Able to walk much better with weight loss. Volume status stable.    - Continue torsemide 100 mg bid and metolazone 2.5 mg to 4 times weekly prior to am torsemide.    - Continue current Coreg, imdur, hydralazine and spironolactone - She is not on Ace or Arb due to elevated creatinine  2. CKD: reviewed BMET from 08/26/13. Renal function stable.    3. Morbid obesity: Congratulated on weight loss. She has lost 20 pounds since the last visit.  Encouraged to continue to lose weight. On waiting list for gastric bypass. Per Dr Aundra Dubin ok for  surgery.  .  4. DVT: Suspected DVT 11/14 admission.  She is on Xarelto, would probably continue long-term given her inactivity. No bleeding problems.  5. Mitral valve repair: Stable on 1/15 echo.  6. Uncontrolled DM - per PCP  Follow up in 2 months   Muskaan Smet D Tarrell Debes NP-C 3:02 PM

## 2013-09-25 ENCOUNTER — Encounter (HOSPITAL_COMMUNITY): Payer: Self-pay

## 2013-09-25 ENCOUNTER — Ambulatory Visit (HOSPITAL_COMMUNITY)
Admission: RE | Admit: 2013-09-25 | Discharge: 2013-09-25 | Disposition: A | Discharge status: Home / Self Care | Payer: Medicare Other | Source: Ambulatory Visit | Attending: Cardiology | Admitting: Cardiology

## 2013-09-25 VITALS — BP 97/64 | HR 96 | Resp 20 | Wt 364.1 lb

## 2013-09-25 DIAGNOSIS — I428 Other cardiomyopathies: Secondary | ICD-10-CM

## 2013-09-25 DIAGNOSIS — N189 Chronic kidney disease, unspecified: Secondary | ICD-10-CM | POA: Diagnosis not present

## 2013-09-25 DIAGNOSIS — I509 Heart failure, unspecified: Secondary | ICD-10-CM | POA: Insufficient documentation

## 2013-09-25 DIAGNOSIS — Z79899 Other long term (current) drug therapy: Secondary | ICD-10-CM | POA: Insufficient documentation

## 2013-09-25 DIAGNOSIS — Z7901 Long term (current) use of anticoagulants: Secondary | ICD-10-CM | POA: Diagnosis not present

## 2013-09-25 DIAGNOSIS — Z794 Long term (current) use of insulin: Secondary | ICD-10-CM | POA: Diagnosis not present

## 2013-09-25 DIAGNOSIS — IMO0001 Reserved for inherently not codable concepts without codable children: Secondary | ICD-10-CM | POA: Diagnosis not present

## 2013-09-25 DIAGNOSIS — I82409 Acute embolism and thrombosis of unspecified deep veins of unspecified lower extremity: Secondary | ICD-10-CM

## 2013-09-25 DIAGNOSIS — G4733 Obstructive sleep apnea (adult) (pediatric): Secondary | ICD-10-CM

## 2013-09-25 DIAGNOSIS — Z87891 Personal history of nicotine dependence: Secondary | ICD-10-CM | POA: Insufficient documentation

## 2013-09-25 DIAGNOSIS — I82403 Acute embolism and thrombosis of unspecified deep veins of lower extremity, bilateral: Secondary | ICD-10-CM

## 2013-09-25 DIAGNOSIS — I5032 Chronic diastolic (congestive) heart failure: Secondary | ICD-10-CM | POA: Diagnosis not present

## 2013-09-25 DIAGNOSIS — E1165 Type 2 diabetes mellitus with hyperglycemia: Secondary | ICD-10-CM

## 2013-09-25 NOTE — Patient Instructions (Signed)
Follow up in 2 months  Do the following things EVERYDAY: 1) Weigh yourself in the morning before breakfast. Write it down and keep it in a log. 2) Take your medicines as prescribed 3) Eat low salt foods-Limit salt (sodium) to 2000 mg per day.  4) Stay as active as you can everyday 5) Limit all fluids for the day to less than 2 liters 

## 2013-09-27 ENCOUNTER — Telehealth: Payer: Self-pay | Admitting: *Deleted

## 2013-09-27 ENCOUNTER — Telehealth (HOSPITAL_COMMUNITY): Payer: Self-pay | Admitting: Cardiology

## 2013-09-27 NOTE — Telephone Encounter (Signed)
Talked with pt and Beth with [facility] [phone] - ate fried chicken last PM - vomtting x1 and diarrhea x 1 at [facility] today. Has problems with transportation.  Suggest if continue - needs to go to ER - appt made 09/30/13 3:45PM with Dr Manson Passey. Beth has call in to heart doctor - inc 5 lbs . Stanton Kidney Jaedynn Bohlken RN 09/27/13 3:20PM

## 2013-09-27 NOTE — Telephone Encounter (Signed)
Please call pt Pt called [facility] to report 5 lb weight gain and N/V/D Weight today 368   Please advise

## 2013-09-27 NOTE — Telephone Encounter (Signed)
Attempted to return phone call twice, left message to call back to discuss plan of care of weight gain and N/V/D. Instructed through message if she got worse or did not get message until after hours to be seen by emergency services (Susie with paramedicine, urgent care, ED)

## 2013-09-30 ENCOUNTER — Telehealth: Payer: Self-pay | Admitting: *Deleted

## 2013-09-30 ENCOUNTER — Encounter: Payer: Self-pay | Admitting: Internal Medicine

## 2013-09-30 ENCOUNTER — Ambulatory Visit (INDEPENDENT_AMBULATORY_CARE_PROVIDER_SITE_OTHER): Payer: Medicare Other | Admitting: Internal Medicine

## 2013-09-30 VITALS — BP 98/65 | HR 78 | Temp 97.2°F | Ht 66.0 in | Wt 364.8 lb

## 2013-09-30 DIAGNOSIS — M79604 Pain in right leg: Secondary | ICD-10-CM | POA: Insufficient documentation

## 2013-09-30 DIAGNOSIS — M79609 Pain in unspecified limb: Secondary | ICD-10-CM

## 2013-09-30 DIAGNOSIS — I5032 Chronic diastolic (congestive) heart failure: Secondary | ICD-10-CM

## 2013-09-30 DIAGNOSIS — L304 Erythema intertrigo: Secondary | ICD-10-CM

## 2013-09-30 DIAGNOSIS — M79605 Pain in left leg: Secondary | ICD-10-CM

## 2013-09-30 DIAGNOSIS — L538 Other specified erythematous conditions: Secondary | ICD-10-CM

## 2013-09-30 DIAGNOSIS — R350 Frequency of micturition: Secondary | ICD-10-CM | POA: Insufficient documentation

## 2013-09-30 DIAGNOSIS — E119 Type 2 diabetes mellitus without complications: Secondary | ICD-10-CM

## 2013-09-30 LAB — BASIC METABOLIC PANEL
BUN: 70 mg/dL — ABNORMAL HIGH (ref 6–23)
CO2: 32 meq/L (ref 19–32)
Calcium: 10.2 mg/dL (ref 8.4–10.5)
Chloride: 88 mEq/L — ABNORMAL LOW (ref 96–112)
Creat: 2.28 mg/dL — ABNORMAL HIGH (ref 0.50–1.10)
GLUCOSE: 94 mg/dL (ref 70–99)
POTASSIUM: 3.3 meq/L — AB (ref 3.5–5.3)
Sodium: 135 mEq/L (ref 135–145)

## 2013-09-30 LAB — LIPID PANEL
CHOLESTEROL: 180 mg/dL (ref 0–200)
HDL: 35 mg/dL — ABNORMAL LOW (ref >39)
LDL Cholesterol: 123 mg/dL — ABNORMAL HIGH (ref 0–99)
Total CHOL/HDL Ratio: 5.1 Ratio
Triglycerides: 109 mg/dL (ref <150)
VLDL: 22 mg/dL (ref 0–40)

## 2013-09-30 LAB — GLUCOSE, CAPILLARY: GLUCOSE-CAPILLARY: 120 mg/dL — AB (ref 70–99)

## 2013-09-30 MED ORDER — KETOCONAZOLE POWD: Status: DC

## 2013-09-30 MED ORDER — GABAPENTIN 100 MG PO CAPS: 100.0000 mg | ORAL_CAPSULE | Freq: Three times a day (TID) | ORAL | Status: AC

## 2013-09-30 NOTE — Patient Instructions (Signed)
General Instructions: For the "odor" in your urine, we are checking a urine sample for infection.  If the results show an infection, we will contact you to start an antibiotic.  If you do not hear from [place], this means there was no infection.  For the rash in your groin skin folds: -STOP using Nystatin powder -START using Ketoconazole powder, twice per day to the affected areas  For your leg/feet pain, we are prescribing Gabapentin.  This medication can cause some sedation, so take the first tablet in the evening.  If you do not have too much sedation, you may take 1 tablet up to 3 times per day.  We are checking a cholesterol level and your potassium level today.  If you do not hear from [place], you may assume these labs were all normal.  Please return for a follow-up visit in 2 months.  Please bring your medicines with you each time you come to clinic.  Medicines may include prescription medications, over-the-counter medications, herbal remedies, eye drops, vitamins, or other pills.   Progress Toward Treatment Goals:  Treatment Goal 09/30/2013  Hemoglobin A1C improved  Blood pressure at goal  Prevent falls unchanged    Self Care Goals & Plans:  Self Care Goal 09/30/2013  Manage my medications take my medicines as prescribed; bring my medications to every visit; refill my medications on time  Monitor my health keep track of my blood glucose; bring my glucose meter and log to each visit  Eat healthy foods drink diet soda or water instead of juice or soda; eat more vegetables; eat foods that are low in salt; eat baked foods instead of fried foods; eat fruit for snacks and desserts  Be physically active -  Meeting treatment goals -    Home Blood Glucose Monitoring 09/30/2013  Check my blood sugar 2 times a day  When to check my blood sugar before meals     Care Management & Community Referrals:  Referral 09/30/2013  Referrals made for care management support none needed  Referrals  made to community resources -

## 2013-09-30 NOTE — Assessment & Plan Note (Addendum)
The patient's weight has returned to near baseline.  Transient increase likely due to dietary indiscretion, as identified by the patient. -check BMET -continue Torsemide 100 BID, spironolactone 25 daily, metolazone 2.5 mg 4x/week, k-dur 40 BID  Addendum 5/19: BMET shows K of 3.3, with Cr rising to 2.3 (from 1.6), with BUN:Cr ratio > 20:1.  I feel that this AKI may represent over-diuresis, given BUN:Cr ratio > 20:1, but at the same time I'm concerned about causing volume overload.  For now, we will decrease Metolazone to 2.5 mg twice per week (M&F), and continue the rest of her medications.  This change should also help her keep from wasting as much potassium.  We will recheck BMET in 2 weeks.  Pt instructed to weigh herself daily, and call our office or CHF clinic if weight increased by more than 3 lbs in 1 day, or 5 lbs in 1 week.

## 2013-09-30 NOTE — Telephone Encounter (Signed)
Please call pt and have come in for evaluation with available MD.

## 2013-09-30 NOTE — Progress Notes (Signed)
HPI The patient is a 53 y.o. female with a history of HTN, OSA, schizoaffective disorder, NICM, CKD, presenting for an acute visit for several complaints, most notably malodorous urine.  The patient notes a 2-day history of "odor" to her urine, urinary frequency, and increased urinary urgency, though no dysuria, suprapubic pain, nausea/vomiting, or flank pain.  The patient states she was prescribed an antibiotic 1 week ago (sent to [place] confirms Augmentin filled 5/6), due to a strong odor to her urine, which she took.  She notes no vaginal discharge, itching, or burning.    The patient notes a sensation of "biting" sensation in her groin skin folds.  The area was examined at her last visit, and was diagnosed as intertrigo.  She has been using Nystatin powder twice per day, which she believes helps this sensation.  The patient declines physical exam of this area today, stating "I don't really want to get undressed".  The patient also notes leg pain, described as a "ball" of pain behind her L > R knee, which occurs when her legs dangle off the bed for too long.  Pain is resolved by changing her body position.  No new weakness, numbness, or tingling, though she has a history of bilateral foot "tingling" sensation.  The patient has a history of DVT 05/2013 (though study was technically difficult), currently on Xarelto.  The patient has a history of DM2, with last A1C = 13.  She is currently taking Lantus 50.  CBG is 120 today.  Glucometer review reveals good blood sugar control for the last 2 weeks, with a range from 121-168.  The patient has a history of CHF.  On 5/15, telephone notes reveal that her weight had increased 5 lbs, to 368 (per home scale), which the patient attributes to dietary indiscretions that day.  Today, her weight is back to 364.8.  She notes she is still taking Torsemide 100 mg BID, metolazone 2.5 mg four days/week, Spironolactone 25 mg daily, and K-dur 40  mEq BID.  The patient has a history of HTN.  BP is low-normal today, as it has been on several prior visits.  The patient had one self-limited episode of vomiting and diarrhea 3 days ago, which subsequently resolved.  ROS: General: no fevers, chills, changes in appetite Skin: no rash HEENT: no blurry vision, hearing changes, sore throat Pulm: no dyspnea, coughing, wheezing CV: no chest pain, palpitations, shortness of breath Abd: no abdominal pain, nausea/vomiting, diarrhea/constipation GU: see HPI Ext: see HPI Neuro: see HPI  Filed Vitals:   09/30/13 1549  BP: 98/65  Pulse: 78  Temp: 97.2 F (36.2 C)    PEX General: alert, cooperative, and in no apparent distress HEENT: pupils equal round and reactive to light, vision grossly intact, oropharynx clear and non-erythematous  Neck: supple Lungs: clear to ascultation bilaterally, normal work of respiration, no wheezes, rales, ronchi Heart: regular rate and rhythm, no murmurs, gallops, or rubs Abdomen: soft, non-tender, non-distended, normal bowel sounds Extremities: trace bilateral non-pitting edema.  Bilateral knees and lower legs with no tenderness to palpation Neurologic: alert & oriented X3, cranial nerves II-XII intact, strength grossly intact, sensation intact to light touch  Current Outpatient Prescriptions on File Prior to Visit  Medication Sig Dispense Refill  . albuterol (PROAIR HFA) 108 (90 BASE) MCG/ACT inhaler Inhale 2 puffs into the lungs every 6 (six) hours as needed for wheezing or shortness of breath.  6.7 g  2  . amitriptyline (ELAVIL) 150  MG tablet Take 1 tablet (150 mg total) by mouth at bedtime.  30 tablet  1  . benzonatate (TESSALON) 100 MG capsule Take 1 capsule (100 mg total) by mouth 2 (two) times daily as needed for cough.  20 capsule  0  . carvedilol (COREG) 12.5 MG tablet Take 1.5 tablets (18.75 mg total) by mouth 2 (two) times daily with a meal.  90 tablet  3  . dextromethorphan-guaiFENesin (MUCINEX  DM) 30-600 MG per 12 hr tablet Take 1 tablet by mouth 2 (two) times daily.  20 tablet  0  . diclofenac sodium (VOLTAREN) 1 % GEL Apply 4 g topically 4 (four) times daily.  1 Tube  3  . docusate sodium (COLACE) 100 MG capsule Take 200 mg by mouth daily.       . Febuxostat 80 MG TABS Take 80 mg by mouth daily.       . ferrous sulfate 325 (65 FE) MG tablet Take 1 tablet (325 mg total) by mouth 2 (two) times daily with a meal.  180 tablet  1  . fexofenadine (ALLEGRA) 30 MG tablet Take 1 tablet (30 mg total) by mouth 2 (two) times daily.  60 tablet  2  . glucose blood (FREESTYLE TEST STRIPS) test strip 1 each by Other route 4 (four) times daily -  before meals and at bedtime. Use as instructed  100 each  12  . glucose monitoring kit (FREESTYLE) monitoring kit 1 each by Does not apply route 4 (four) times daily -  before meals and at bedtime. Dx code 250.00  1 each  0  . hydrALAZINE (APRESOLINE) 50 MG tablet Take 1 tablet (50 mg total) by mouth 3 (three) times daily.  90 tablet  6  . HYDROcodone-acetaminophen (NORCO) 10-325 MG per tablet Take 1 tablet by mouth every 6 (six) hours as needed for severe pain.  30 tablet  0  . HYDROcodone-acetaminophen (NORCO) 10-325 MG per tablet Take 1 tablet by mouth every 6 (six) hours as needed for severe pain.  120 tablet  0  . Insulin Detemir (LEVEMIR) 100 UNIT/ML Pen Inject 35 Units into the skin daily at 10 pm.  45 mL  11  . Insulin Pen Needle 32G X 4 MM MISC Please use 1 needle at night to administer insulin via Lantus pen  30 each  11  . isosorbide mononitrate (IMDUR) 60 MG 24 hr tablet Take 90 mg by mouth daily.      Marland Kitchen KLOR-CON M20 20 MEQ tablet TAKE TWO TABLETS BY MOUTH TWICE DAILY  120 tablet  3  . Lancets (FREESTYLE) lancets 1 each by Other route 4 (four) times daily -  before meals and at bedtime. Use as instructed  100 each  12  . metolazone (ZAROXOLYN) 5 MG tablet Take 1 tablet (5 mg total) by mouth 4 (four) times a week. Monday,Wednesday,Friday and Saturday.   30 minutes before torsemide  20 tablet  3  . nystatin (MYCOSTATIN) powder Apply topically 4 (four) times daily.  15 g  0  . omeprazole (PRILOSEC) 40 MG capsule Take 1 capsule (40 mg total) by mouth daily.  30 capsule  3  . polyethylene glycol (MIRALAX / GLYCOLAX) packet Take 17 g by mouth daily as needed for moderate constipation.      . Rivaroxaban (XARELTO) 20 MG TABS tablet Take 20 mg by mouth every morning.      Marland Kitchen spironolactone (ALDACTONE) 25 MG tablet Take 25 mg by mouth daily.      Marland Kitchen  torsemide (DEMADEX) 100 MG tablet Take 1 tablet (100 mg total) by mouth 2 (two) times daily.  180 tablet  3  . [DISCONTINUED] QUEtiapine (SEROQUEL) 100 MG tablet Take 400 mg by mouth at bedtime.        No current facility-administered medications on file prior to visit.    Assessment/Plan

## 2013-09-30 NOTE — Assessment & Plan Note (Signed)
The patient has a history of intertrigo, noted at last visit.  Pt declines groin exam today.  We will treat empirically for intertrigo, though if symptoms do not improve, the patient will need a follow-up visit for more thorough examination -stop nystatin powder -start ketoconazole powder BID to the affected area

## 2013-09-30 NOTE — Telephone Encounter (Signed)
Pt seen by pcp today, wt back down no reports of n/v/d

## 2013-09-30 NOTE — Assessment & Plan Note (Addendum)
Lab Results  Component Value Date   HGBA1C 13.0* 08/20/2013   HGBA1C 6.7 09/24/2012   HGBA1C 7.3* 07/05/2010     Assessment: Diabetes control: fair control Progress toward A1C goal:  improved Comments: Blood sugars have significantly improved since last visit.  Pt notes tingling in feet, consistent with diabetic neuropathy  Plan: Medications:  Continue Lantus 50.  Start Gabapentin 100 mg, first just qhs, then up to TID.  Can further increase as indicated Home glucose monitoring: Frequency: 2 times a day Timing: before meals Instruction/counseling given: reminded to bring blood glucose meter & log to each visit Educational resources provided: brochure Self management tools provided: copy of home glucose meter download;home glucose logbook Other plans: check lipid profile today'  Addendum 5/19: LDL elevated on lipid panel, we will start Atorvastatin 40 mg daily.

## 2013-09-30 NOTE — Assessment & Plan Note (Signed)
The patient notes bilateral posterior leg pain, which is purely positionally dependent, and resolves with change in position.  This likely represents transient nerve compression vs vascular compression.  Muscle strain is also on the differential.  DVT is unlikely, given lack of physical exam findings, intermittent symptoms, and current use of xarelto. -encouraged behavior modification -start gabapentin for concomitant diabetic neuropathy, which may also benefit this pain

## 2013-09-30 NOTE — Assessment & Plan Note (Addendum)
The patient notes urinary frequency, odor, and urgency (though history of urge incontinence), without dysuria, flank pain, fever, or n/v.  Symptoms may represent UTI (though recently completed Augmentin) vs urge incontinence. -check UA and culture for resolution of prior UTI  Addendum 5/21: Urine culture shows >100K colonies of E coli, resistant to augmentin, but sensitive to Bactrim.  Will treat for a complicated UTI with Bactrim 1 DS tablet BID for 7 days.

## 2013-09-30 NOTE — Telephone Encounter (Signed)
[name] beth calls and states that pt has been c/o 1) burning w/ voiding, sudden urge/ incontinence, dribbling 2) vaginal burning/ irritation 3) R lower leg/ calf pain- hx of "clots", when leg dangles and then pt stands down has shooting pain up through heel to lower leg, plantar fasciitis? Please eval for these

## 2013-10-01 ENCOUNTER — Encounter (HOSPITAL_COMMUNITY): Payer: Self-pay | Admitting: Emergency Medicine

## 2013-10-01 DIAGNOSIS — Z9889 Other specified postprocedural states: Secondary | ICD-10-CM | POA: Insufficient documentation

## 2013-10-01 DIAGNOSIS — F3289 Other specified depressive episodes: Secondary | ICD-10-CM | POA: Insufficient documentation

## 2013-10-01 DIAGNOSIS — Z86718 Personal history of other venous thrombosis and embolism: Secondary | ICD-10-CM | POA: Insufficient documentation

## 2013-10-01 DIAGNOSIS — Z87440 Personal history of urinary (tract) infections: Secondary | ICD-10-CM | POA: Insufficient documentation

## 2013-10-01 DIAGNOSIS — Z95810 Presence of automatic (implantable) cardiac defibrillator: Secondary | ICD-10-CM | POA: Insufficient documentation

## 2013-10-01 DIAGNOSIS — Z9981 Dependence on supplemental oxygen: Secondary | ICD-10-CM | POA: Insufficient documentation

## 2013-10-01 DIAGNOSIS — I10 Essential (primary) hypertension: Secondary | ICD-10-CM | POA: Insufficient documentation

## 2013-10-01 DIAGNOSIS — J449 Chronic obstructive pulmonary disease, unspecified: Secondary | ICD-10-CM | POA: Insufficient documentation

## 2013-10-01 DIAGNOSIS — I5042 Chronic combined systolic (congestive) and diastolic (congestive) heart failure: Secondary | ICD-10-CM | POA: Insufficient documentation

## 2013-10-01 DIAGNOSIS — Z954 Presence of other heart-valve replacement: Secondary | ICD-10-CM | POA: Insufficient documentation

## 2013-10-01 DIAGNOSIS — Z8739 Personal history of other diseases of the musculoskeletal system and connective tissue: Secondary | ICD-10-CM | POA: Insufficient documentation

## 2013-10-01 DIAGNOSIS — F329 Major depressive disorder, single episode, unspecified: Secondary | ICD-10-CM | POA: Insufficient documentation

## 2013-10-01 DIAGNOSIS — F411 Generalized anxiety disorder: Secondary | ICD-10-CM | POA: Insufficient documentation

## 2013-10-01 DIAGNOSIS — K219 Gastro-esophageal reflux disease without esophagitis: Secondary | ICD-10-CM | POA: Insufficient documentation

## 2013-10-01 DIAGNOSIS — Z7901 Long term (current) use of anticoagulants: Secondary | ICD-10-CM | POA: Insufficient documentation

## 2013-10-01 DIAGNOSIS — M25579 Pain in unspecified ankle and joints of unspecified foot: Secondary | ICD-10-CM | POA: Insufficient documentation

## 2013-10-01 DIAGNOSIS — Z8742 Personal history of other diseases of the female genital tract: Secondary | ICD-10-CM | POA: Insufficient documentation

## 2013-10-01 DIAGNOSIS — Z8659 Personal history of other mental and behavioral disorders: Secondary | ICD-10-CM | POA: Insufficient documentation

## 2013-10-01 DIAGNOSIS — Z794 Long term (current) use of insulin: Secondary | ICD-10-CM | POA: Insufficient documentation

## 2013-10-01 DIAGNOSIS — G8929 Other chronic pain: Secondary | ICD-10-CM | POA: Insufficient documentation

## 2013-10-01 DIAGNOSIS — J4489 Other specified chronic obstructive pulmonary disease: Secondary | ICD-10-CM | POA: Insufficient documentation

## 2013-10-01 DIAGNOSIS — Z79899 Other long term (current) drug therapy: Secondary | ICD-10-CM | POA: Insufficient documentation

## 2013-10-01 DIAGNOSIS — M109 Gout, unspecified: Secondary | ICD-10-CM | POA: Insufficient documentation

## 2013-10-01 DIAGNOSIS — G4733 Obstructive sleep apnea (adult) (pediatric): Secondary | ICD-10-CM | POA: Insufficient documentation

## 2013-10-01 DIAGNOSIS — Z862 Personal history of diseases of the blood and blood-forming organs and certain disorders involving the immune mechanism: Secondary | ICD-10-CM | POA: Insufficient documentation

## 2013-10-01 DIAGNOSIS — Z87891 Personal history of nicotine dependence: Secondary | ICD-10-CM | POA: Insufficient documentation

## 2013-10-01 LAB — URINALYSIS, MICROSCOPIC ONLY
BACTERIA UA: NONE SEEN
Casts: NONE SEEN
Crystals: NONE SEEN

## 2013-10-01 LAB — URINALYSIS, ROUTINE W REFLEX MICROSCOPIC
Bilirubin Urine: NEGATIVE
GLUCOSE, UA: NEGATIVE mg/dL
Hgb urine dipstick: NEGATIVE
Ketones, ur: NEGATIVE mg/dL
NITRITE: NEGATIVE
Protein, ur: NEGATIVE mg/dL
SPECIFIC GRAVITY, URINE: 1.008 (ref 1.005–1.030)
Urobilinogen, UA: 0.2 mg/dL (ref 0.0–1.0)
pH: 6.5 (ref 5.0–8.0)

## 2013-10-01 MED ORDER — MICONAZOLE NITRATE 2 % POWD: Status: DC

## 2013-10-01 MED ORDER — ATORVASTATIN CALCIUM 40 MG PO TABS: 40.0000 mg | ORAL_TABLET | Freq: Every day | ORAL | Status: AC

## 2013-10-01 NOTE — ED Notes (Signed)
According to [facility] the patient called she has been having gout pain in her right foot.  The patient went to her PCP for shoulder pain and they gave her a steroid shot.  She says she told her PCP about her foot and they did not do anything for her foot.  The patient said she has been eating hot sauces and that might have triggered her gout.  She rates her pain 10/10.  [facility] transported the patient here to be evaluated.

## 2013-10-01 NOTE — Addendum Note (Signed)
Addended by: Linward Headland on: 10/01/2013 10:32 AM   Modules accepted: Orders, Medications

## 2013-10-02 ENCOUNTER — Emergency Department (HOSPITAL_COMMUNITY)
Admission: EM | Admit: 2013-10-02 | Discharge: 2013-10-02 | Disposition: A | Discharge status: Home / Self Care | Payer: Medicare Other | Attending: Emergency Medicine | Admitting: Emergency Medicine

## 2013-10-02 ENCOUNTER — Telehealth: Payer: Self-pay | Admitting: Internal Medicine

## 2013-10-02 ENCOUNTER — Other Ambulatory Visit: Payer: Self-pay

## 2013-10-02 DIAGNOSIS — M79673 Pain in unspecified foot: Secondary | ICD-10-CM

## 2013-10-02 MED ORDER — HYDROCODONE-ACETAMINOPHEN 5-325 MG PO TABS
2.0000 | ORAL_TABLET | Freq: Once | ORAL | Status: AC
  Administered 2013-10-02: 2 via ORAL
  Filled 2013-10-02: qty 2

## 2013-10-02 MED ORDER — HYDROCODONE-ACETAMINOPHEN 10-325 MG PO TABS: 1.0000 | ORAL_TABLET | Freq: Four times a day (QID) | ORAL | Status: DC | PRN

## 2013-10-02 NOTE — ED Provider Notes (Signed)
CSN: [id number]     Arrival date & time 10/01/13  2207 History   First MD Initiated Contact with Patient 10/02/13 0103     Chief Complaint  Patient presents with  . Gout    patient complains of "charlie horse pain" in her left foot     (Consider location/radiation/quality/duration/timing/severity/associated sxs/prior Treatment) HPI Patient is a very poor historian. She presents with multiple days of right foot pain that radiates up into her right lower leg and upper leg. The pain is worse with walking. She has no focal numbness or weakness. She has no history of trauma. There is no redness or swelling. Patient has a history of gout but has been noncompliant with medication. She ran out of her hydrocodone today. She was seen by her PCP 2 days ago and started on gabapentin for concern that this may be related to her neuropathy. Past Medical History  Diagnosis Date  . Severe mitral regurgitation     a. Minimally invasive MV repair on 12/18/08. Additionally TV repair  and PFO closure.  . Chronic systolic CHF (congestive heart failure)     a. 12/2011 Echo: EF 30%, diff HK, mild LVH, Gr 1 DD, mildly dil LA. b. 2/2 NICM.  Marland Kitchen Microcytic anemia     a. Small capsule endoscopy 2012 with gastric erosion and small colonic AVMs. b. EGD and colonoscopy before that in 12/11 did not reveal source of bleeding)  . Gastric ulcer   . NICM (nonischemic cardiomyopathy)     a.12/2011: s/p SJM [id number] single lead ICD, Ser #: [id number]  . Obstructive sleep apnea     on CPAP  . GERD (gastroesophageal reflux disease)   . HTN (hypertension)   . Gout   . Depression   . Colitis 2/11  . Chronic back pain   . Chronic knee pain   . DVT (deep venous thrombosis)     right leg  . Anxiety   . Rheumatoid arthritis(714.0)     "shoulders & knees"  . Sleep disturbance     07/14/11 "haven't slept in 10 months; since going to [facility]"  . Hx: UTI (urinary tract infection)   . Nocturia   . COPD (chronic obstructive pulmonary  disease)   . Bronchitis   . PTSD (post-traumatic stress disorder)   . Schizophrenia   . Dysmenorrhea   . Morbid obesity   . Acute renal insufficiency     a. ?Cardiorenal 09/2012.  Marland Kitchen Hyperglycemia   . Automatic implantable cardioverter-defibrillator in situ   . Chronic diastolic HF (heart failure)     a. ECHO (05/2013) EF 50-55%, RV dilated and [facility]   Past Surgical History  Procedure Laterality Date  . Cardiac valve replacement  12/2008    cardiac  . Cardiac catheterization  04/20/2009, 11/04/2008  . Right knee arthroscopy  06/11/2004  . Extraction of teeth  12/04/2008  . Right mini thoracotomy for mitral valve repair and closure of [place]  12/18/2008  . Cardiac defibrillator placement     Family History  Problem Relation Age of Onset  . Hypertension Sister   . Alcoholism Father     died in his 32's or 26's  . Other Mother     alive & well @ 35.   History  Substance Use Topics  . Smoking status: Former Smoker -- 0.50 packs/day for 27 years    Types: Cigarettes    Quit date: 12/18/2008  . Smokeless tobacco: Never Used  . Alcohol Use: No  Comment: "stopped drinking alcohol 12/2003; did drink ~ 6 shots/wk"   OB History   Grav Para Term Preterm Abortions TAB SAB Ect Mult Living   5 5 5       5      Review of Systems  Constitutional: Negative for fever and chills.  Respiratory: Negative for cough and shortness of breath.   Cardiovascular: Negative for chest pain.  Gastrointestinal: Negative for nausea and abdominal pain.  Musculoskeletal: Positive for arthralgias.  Skin: Negative for rash and wound.  Neurological: Negative for weakness and numbness.  All other systems reviewed and are negative.     Allergies  Risperidone; Colchicine; Morphine; and Shrimp  Home Medications   Prior to Admission medications   Medication Sig Start Date End Date Taking? Authorizing Provider  albuterol (PROAIR HFA) 108 (90 BASE) MCG/ACT inhaler Inhale 2 puffs into the lungs every 6  (six) hours as needed for wheezing or shortness of breath. 08/27/13  Yes 08/29/13, MD  amitriptyline (ELAVIL) 150 MG tablet Take 1 tablet (150 mg total) by mouth at bedtime. 08/22/13  Yes 10/22/13, MD  atorvastatin (LIPITOR) 40 MG tablet Take 1 tablet (40 mg total) by mouth daily. 10/01/13 10/01/14 Yes 10/03/14, MD  carvedilol (COREG) 12.5 MG tablet Take 1.5 tablets (18.75 mg total) by mouth 2 (two) times daily with a meal. 07/01/13  Yes 07/03/13, NP  febuxostat (ULORIC) 40 MG tablet Take 80 mg by mouth daily.   Yes Historical Provider, MD  gabapentin (NEURONTIN) 100 MG capsule Take 1 capsule (100 mg total) by mouth 3 (three) times daily. 09/30/13 09/30/14 Yes 10/02/14, MD  hydrALAZINE (APRESOLINE) 50 MG tablet Take 1 tablet (50 mg total) by mouth 3 (three) times daily. 01/08/13  Yes 01/10/13, MD  HYDROcodone-acetaminophen [facility]) 10-325 MG per tablet Take 1 tablet by mouth every 6 (six) hours as needed for severe pain. 09/09/13  Yes 09/11/13, MD  Insulin Detemir (LEVEMIR) 100 UNIT/ML Pen Inject 35 Units into the skin daily at 10 pm. 08/22/13  Yes 10/22/13, MD  isosorbide mononitrate (IMDUR) 60 MG 24 hr tablet Take 90 mg by mouth daily.   Yes Historical Provider, MD  Miconazole Nitrate 2 % POWD Apply twice per day to groin skin folds 10/01/13  Yes 10/03/13, MD  omeprazole (PRILOSEC) 40 MG capsule Take 1 capsule (40 mg total) by mouth daily. 06/28/13  Yes 06/30/13, MD  potassium chloride SA (K-DUR,KLOR-CON) 20 MEQ tablet Take 20 mEq by mouth 2 (two) times daily.   Yes Historical Provider, MD  Rivaroxaban (XARELTO) 20 MG TABS tablet Take 20 mg by mouth every morning.   Yes Historical Provider, MD  spironolactone (ALDACTONE) 25 MG tablet Take 25 mg by mouth daily.   Yes Historical Provider, MD  torsemide (DEMADEX) 100 MG tablet Take 1 tablet (100 mg total) by mouth 2 (two) times daily. 07/04/13 07/04/14 Yes 07/06/14, NP   BP 132/79  Pulse 80   Temp(Src) 98.5 F (36.9 C) (Oral)  Resp 18  Ht 5\' 5"  (1.651 m)  Wt 357 lb (161.934 kg)  BMI 59.41 kg/m2  SpO2 98%  LMP 05/05/2012 Physical Exam  Nursing note and vitals reviewed. Constitutional: She is oriented to person, place, and time. She appears well-developed and well-nourished. No distress.  Obese  HENT:  Head: Normocephalic and atraumatic.  Mouth/Throat: Oropharynx is clear and moist.  Eyes: EOM are normal. Pupils are equal, round, and reactive  to light.  Neck: Normal range of motion. Neck supple.  Cardiovascular: Normal rate and regular rhythm.   Pulmonary/Chest: Effort normal and breath sounds normal. No respiratory distress. She has no wheezes. She has no rales.  Abdominal: Soft. Bowel sounds are normal. She exhibits no distension and no mass. There is no tenderness. There is no rebound and no guarding.  Musculoskeletal: Normal range of motion. She exhibits tenderness (patient complains of diffuse tenderness to the right lower extremity. She is noncompliant with the exam. She does appear to have increased tenderness in the right midfoot with some warmth to palpation. There is no definite swelling. She has 2+ dorsalis ped). She exhibits no edema.  Neurological: She is alert and oriented to person, place, and time.  5/5 motor in all extremities. Sensation is grossly intact.  Skin: Skin is warm and dry. No rash noted. No erythema.  Psychiatric: She has a normal mood and affect. Her behavior is normal.    ED Course  Procedures (including critical care time) Labs Review Labs Reviewed - No data to display  Imaging Review No results found.   EKG Interpretation None      MDM   Final diagnoses:  None    Have a low suspicion for septic joint. The pain seems to be mostly located in the foot there is some warmth to the area. She has no systemic symptoms. Her presentation corresponds with her running out of her chronic pain medication. She does have a history of  gastroesophageal reflux disease and peptic ulcer disease. She also is allergic to colchicine. Gout treatment options are limited. We'll give a short course of hydrocodone and place in orthotics shoe for comfort. Patient advised to followup with her primary Dr. and take gabapentin as previously prescribed. I do not believe any further imaging or laboratory testing is necessary in emergency department. Return precautions have been given.    Loren Racer, MD 10/02/13 [phone]

## 2013-10-02 NOTE — Discharge Instructions (Signed)

## 2013-10-02 NOTE — Telephone Encounter (Signed)
Pt has gout and cant walk she had a steroid shot at Dr. Nickola Major and it is not helping.  Pt is taking Vicodin w/o relief.  Advised to come to the ED for further treatment   Desma Maxim MD

## 2013-10-02 NOTE — Telephone Encounter (Signed)
Pt cancelled her appt, will send to charsetta to reschedule

## 2013-10-02 NOTE — Progress Notes (Signed)
Case discussed with Dr. Brown soon after the resident saw the patient.  We reviewed the resident's history and exam and pertinent patient test results.  I agree with the assessment, diagnosis, and plan of care documented in the resident's note. 

## 2013-10-03 ENCOUNTER — Telehealth: Payer: Self-pay | Admitting: *Deleted

## 2013-10-03 ENCOUNTER — Telehealth (HOSPITAL_COMMUNITY): Payer: Self-pay | Admitting: Cardiology

## 2013-10-03 ENCOUNTER — Other Ambulatory Visit: Payer: Self-pay | Admitting: *Deleted

## 2013-10-03 DIAGNOSIS — M25561 Pain in right knee: Secondary | ICD-10-CM

## 2013-10-03 DIAGNOSIS — M25562 Pain in left knee: Principal | ICD-10-CM

## 2013-10-03 DIAGNOSIS — M1710 Unilateral primary osteoarthritis, unspecified knee: Secondary | ICD-10-CM

## 2013-10-03 LAB — URINE CULTURE

## 2013-10-03 MED ORDER — SULFAMETHOXAZOLE-TRIMETHOPRIM 800-160 MG PO TABS: 1.0000 | ORAL_TABLET | Freq: Two times a day (BID) | ORAL | Status: DC

## 2013-10-03 NOTE — Telephone Encounter (Signed)
Reviewed with Dr Shirlee Latch, he would like for pt to resume Torsemide 100 mg BID and Metoloazone 2 times a week, [name] is aware and agreeable

## 2013-10-03 NOTE — Telephone Encounter (Signed)
Beth called with concerns after HH visit today. Weight up 8.5 lbs Pt saw PCP this week and was told to decrease metolazone to twice a week instead of four times a week, due to kidney failure Pt did take metolazone on Monday however no diuretics at all on Tuesday and Wednesday, due to gout flare up in feet-pt unable to walk to bathroom Beth advised to restart meds  Please advise further if needed  P.S. Pt also c/o chest pains during [facility] visit today, Beth feels its all epigastric increased belching and passing gas during visit

## 2013-10-03 NOTE — Telephone Encounter (Signed)
[name] with [facility] [phone] called for refill on pain med - note sent to Dr Bosie Clos. Pt was unable to get Miconazole powder. Talked with pt - she is able to get this powder OTC - it is with antifungal product for about $10.00. [facility] was called to check on info and states insurance will not cover. Pt aware and try to get. Stanton Kidney Nidal Rivet RN 10/03/13 2PM

## 2013-10-03 NOTE — Telephone Encounter (Signed)
Talked with pt 3:45PM - pharmacy is not holding Rx for pain med - stated do not fill till 10/02/13. Pt talked with boyfriend of daughter who picked up Rx - states only given one Rx. Pt states she is in severe pain from gout and leg. Pt aware request has already been sent to Dr Bosie Clos. Stanton Kidney Kveon Casanas RN 10/03/13 3:50PM

## 2013-10-03 NOTE — Addendum Note (Signed)
Addended by: Linward Headland on: 10/03/2013 04:03 PM   Modules accepted: Orders

## 2013-10-04 ENCOUNTER — Ambulatory Visit (INDEPENDENT_AMBULATORY_CARE_PROVIDER_SITE_OTHER): Payer: Medicare Other | Admitting: Internal Medicine

## 2013-10-04 ENCOUNTER — Encounter: Payer: Self-pay | Admitting: Internal Medicine

## 2013-10-04 VITALS — BP 108/70 | HR 80 | Temp 97.0°F | Ht 66.0 in | Wt 372.1 lb

## 2013-10-04 DIAGNOSIS — M722 Plantar fascial fibromatosis: Secondary | ICD-10-CM

## 2013-10-04 DIAGNOSIS — I5032 Chronic diastolic (congestive) heart failure: Secondary | ICD-10-CM

## 2013-10-04 DIAGNOSIS — M109 Gout, unspecified: Secondary | ICD-10-CM

## 2013-10-04 DIAGNOSIS — L304 Erythema intertrigo: Secondary | ICD-10-CM

## 2013-10-04 DIAGNOSIS — L538 Other specified erythematous conditions: Secondary | ICD-10-CM

## 2013-10-04 DIAGNOSIS — E119 Type 2 diabetes mellitus without complications: Secondary | ICD-10-CM

## 2013-10-04 LAB — GLUCOSE, CAPILLARY: Glucose-Capillary: 92 mg/dL (ref 70–99)

## 2013-10-04 MED ORDER — DICLOFENAC SODIUM 1 % TD GEL: 2.0000 g | Freq: Four times a day (QID) | TRANSDERMAL | Status: DC

## 2013-10-04 MED ORDER — PREDNISONE 20 MG PO TABS: 20.0000 mg | ORAL_TABLET | Freq: Every day | ORAL | Status: DC

## 2013-10-04 NOTE — Patient Instructions (Signed)
General Instructions: Your toe pain is likely due to a gout flare. -I recommend using Voltaren gel on the area, which will reduce inflammation and treat pain -if the pain does not improve, we may need to try a short course of prednisone, which is a stronger medication for gout, but can cause increased leg swelling  Your foot pain is likely due to a condition called Plantar Fasciitis (see information below). -you may use Voltaren gel on the bottom of your foot as well to help treat this condition -walk with your cushioned shoe, and perform stretching exercises before walking  The "biting" feeling in your groin is due to a Candidal fungal infection -wash and dry the area thoroughly daily -apply Miconazole powder to the area twice per day  If symptoms do not improve, please call our office on Tuesday, and we can discuss further treatment plans.  Please return for a follow-up visit in 3-4 weeks.  Please bring your medicines with you each time you come to clinic.  Medicines may include prescription medications, over-the-counter medications, herbal remedies, eye drops, vitamins, or other pills.   Progress Toward Treatment Goals:  Treatment Goal 10/04/2013  Hemoglobin A1C at goal  Blood pressure at goal  Prevent falls unchanged    Self Care Goals & Plans:  Self Care Goal 10/04/2013  Manage my medications take my medicines as prescribed; bring my medications to every visit; refill my medications on time  Monitor my health keep track of my blood glucose; bring my glucose meter and log to each visit  Eat healthy foods drink diet soda or water instead of juice or soda; eat more vegetables; eat foods that are low in salt; eat baked foods instead of fried foods; eat fruit for snacks and desserts  Be physically active -  Meeting treatment goals -    Home Blood Glucose Monitoring 09/30/2013  Check my blood sugar 2 times a day  When to check my blood sugar before meals     Care Management &  Community Referrals:  Referral 10/04/2013  Referrals made for care management support none needed  Referrals made to community resources -       Plantar Fasciitis Plantar fasciitis is a common condition that causes foot pain. It is soreness (inflammation) of the band of tough fibrous tissue on the bottom of the foot that runs from the heel bone (calcaneus) to the ball of the foot. The cause of this soreness may be from excessive standing, poor fitting shoes, running on hard surfaces, being overweight, having an abnormal walk, or overuse (this is common in runners) of the painful foot or feet. It is also common in aerobic exercise dancers and ballet dancers. SYMPTOMS  Most people with plantar fasciitis complain of:  Severe pain in the morning on the bottom of their foot especially when taking the first steps out of bed. This pain recedes after a few minutes of walking.  Severe pain is experienced also during walking following a long period of inactivity.  Pain is worse when walking barefoot or up stairs DIAGNOSIS   Your caregiver will diagnose this condition by examining and feeling your foot.  Special tests such as X-rays of your foot, are usually not needed. PREVENTION   Consult a sports medicine professional before beginning a new exercise program.  Walking programs offer a good workout. With walking there is a lower chance of overuse injuries common to runners. There is less impact and less jarring of the joints.  Begin all  new exercise programs slowly. If problems or pain develop, decrease the amount of time or distance until you are at a comfortable level.  Wear good shoes and replace them regularly.  Stretch your foot and the heel cords at the back of the ankle (Achilles tendon) both before and after exercise.  Run or exercise on even surfaces that are not hard. For example, asphalt is better than pavement.  Do not run barefoot on hard surfaces.  If using a treadmill,  vary the incline.  Do not continue to workout if you have foot or joint problems. Seek professional help if they do not improve. HOME CARE INSTRUCTIONS   Avoid activities that cause you pain until you recover.  Use ice or cold packs on the problem or painful areas after working out.  Only take over-the-counter or prescription medicines for pain, discomfort, or fever as directed by your caregiver.  Soft shoe inserts or athletic shoes with air or gel sole cushions may be helpful.  If problems continue or become more severe, consult a sports medicine caregiver or your own health care provider. Cortisone is a potent anti-inflammatory medication that may be injected into the painful area. You can discuss this treatment with your caregiver. MAKE SURE YOU:   Understand these instructions.  Will watch your condition.  Will get help right away if you are not doing well or get worse. Document Released: 01/25/2001 Document Revised: 07/25/2011 Document Reviewed: 03/26/2008 [facility] Patient Information 2014 [place], [place].

## 2013-10-04 NOTE — Telephone Encounter (Signed)
Pt aware - pt states was seen in clinic today and got 30 pills for pain.

## 2013-10-04 NOTE — Progress Notes (Signed)
Case discussed with Dr. Brown soon after the resident saw the patient.  We reviewed the resident's history and exam and pertinent patient test results.  I agree with the assessment, diagnosis, and plan of care documented in the resident's note. 

## 2013-10-04 NOTE — Assessment & Plan Note (Addendum)
The patient's right 1st MTP pain is likely due to an acute gout flare, given its edema and warmth.  This was likely triggered by stopping Febuxostat.  No erythema, drainage, fevers, or preceding trauma to suggest infection.  The patient reportedly follows with rheumatology.  We are limited in treatment options, as patient has had an allergic reaction to colchicine, and should not take NSAIDs due to CKD. -we will start with voltargen gel, for relief of pain and inflammation -continue hydrocodone, per pain contract -We discussed the option of prescribing a short course of Prednisone, 20 mg daily for 3 days, to treat this acute flare.  I discussed with the patient the risks of increased LE edema, but also the benefits of resolving this gout flare.  Although this discussion took place during the visit, we reached the final decision to prescribe this medication just after the patient left, when talking with my attending, so the patient was called, and a voicemail left explaining that we were going forward with this plan.

## 2013-10-04 NOTE — Assessment & Plan Note (Signed)
The patient continues to note candidal intertrigo.  She has not yet filled prescription for antifungal powder. -pt encouraged to fill prescription for miconazole powder (ketoconazole powder not covered by pt's insurance.  Miconazole ~$10 OTC)

## 2013-10-04 NOTE — Assessment & Plan Note (Addendum)
The patient's heel pain is likely due to plantar fasciitis, given its characteristic location at the calcaneal insertion of the tendon.  Again, we shouldn't use NSAIDs due to CKD.  Pt is using a padded shoe given by the ED. -voltaren gel to the bottom of the foot -systemic steroids will also help this -stretching exercises, padded shoe -if no relief, can consider a glucocorticoid injection in clinic

## 2013-10-04 NOTE — Progress Notes (Signed)
HPI The patient is a 53 y.o. female with a history of HTN, gout, schizoaffective disorder, CKD, presenting for an acute visit for foot pain.  The patient notes a 5-day history of right foot pain, described as a "cramp" type pain underneath her foot, as well as a "dull" pain with warmth in her right 1st MTP.  The patient has a history of gout, and reports running out of her Uloric 7 days ago.  She reports seeing her Rheumatologist 4 days ago, who attributed her pain to a gout flare, and recommended restarting the Uloric, though the patient has not yet done this due to insurance reasons.  The patient was seen in the ED two days ago, and was prescribed hydrocodone for presumed gout (allergic to colchicine, no NSAIDs due to CKD), taking 3 tabs/day, which helps the pain some.  The patient has been taking no OTC medications.  The pain is now an 8/10.  No fevers, erythema of the joint, drainage, trauma.  The patient continues to note symptoms of irritation under her groin skin folds.  She has not started antifungal powder yet, as prescribed at her last visit for candidal intertrigo.  The patient's weight has increased to 372, from 364.8 at her last visit.  Pt confirms what was mentioned in her Cardiologist's phone note from yesterday, which is that she stopped taking her torsemide due to fear of not being able to get to the bathroom with her foot pain.  The patient confirms that she has re-started her torsemide, as well as her spironolactone, and metolazone 2.5 mg twice per week.  The patient notes no significant dyspnea today.  ROS: General: no fevers, chills, changes in weight, changes in appetite Skin: no rash HEENT: no blurry vision, hearing changes, sore throat Pulm: no dyspnea, coughing, wheezing CV: no chest pain, palpitations, shortness of breath Abd: no abdominal pain, nausea/vomiting, diarrhea/constipation GU: no dysuria, hematuria, polyuria Ext: no arthralgias, myalgias Neuro: no weakness,  numbness, or tingling  Filed Vitals:   10/04/13 0953  BP: 108/70  Pulse: 80  Temp: 97 F (36.1 C)    PEX General: alert, cooperative, and in no apparent distress HEENT: pupils equal round and reactive to light, vision grossly intact, oropharynx clear and non-erythematous  Neck: supple Lungs: clear to ascultation bilaterally, normal work of respiration, no wheezes, rales, ronchi Heart: regular rate and rhythm, no murmurs, gallops, or rubs Abdomen: soft, non-tender, non-distended, normal bowel sounds Extremities: Right 1st MTP with mild edema and warmth, though no erythema.  Right plantar foot with tenderness to palpation at the insertion of the plantar fascia into the heel.  1-2+ bilateral pitting edema bilaterally Groin: mild scaling noted between skin folds of groin, no erythema Neurologic: alert & oriented X3, cranial nerves II-XII intact, strength grossly intact, sensation intact to light touch  Current Outpatient Prescriptions on File Prior to Visit  Medication Sig Dispense Refill  . albuterol (PROAIR HFA) 108 (90 BASE) MCG/ACT inhaler Inhale 2 puffs into the lungs every 6 (six) hours as needed for wheezing or shortness of breath.  6.7 g  2  . amitriptyline (ELAVIL) 150 MG tablet Take 1 tablet (150 mg total) by mouth at bedtime.  30 tablet  1  . atorvastatin (LIPITOR) 40 MG tablet Take 1 tablet (40 mg total) by mouth daily.  30 tablet  11  . carvedilol (COREG) 12.5 MG tablet Take 1.5 tablets (18.75 mg total) by mouth 2 (two) times daily with a meal.  90 tablet  3  .  febuxostat (ULORIC) 40 MG tablet Take 80 mg by mouth daily.      Marland Kitchen gabapentin (NEURONTIN) 100 MG capsule Take 1 capsule (100 mg total) by mouth 3 (three) times daily.  90 capsule  0  . hydrALAZINE (APRESOLINE) 50 MG tablet Take 1 tablet (50 mg total) by mouth 3 (three) times daily.  90 tablet  6  . HYDROcodone-acetaminophen (NORCO) 10-325 MG per tablet Take 1 tablet by mouth every 6 (six) hours as needed for severe pain.   120 tablet  0  . HYDROcodone-acetaminophen (NORCO) 10-325 MG per tablet Take 1 tablet by mouth every 6 (six) hours as needed.  10 tablet  0  . Insulin Detemir (LEVEMIR) 100 UNIT/ML Pen Inject 35 Units into the skin daily at 10 pm.  45 mL  11  . isosorbide mononitrate (IMDUR) 60 MG 24 hr tablet Take 90 mg by mouth daily.      . Miconazole Nitrate 2 % POWD Apply twice per day to groin skin folds  500 g  2  . omeprazole (PRILOSEC) 40 MG capsule Take 1 capsule (40 mg total) by mouth daily.  30 capsule  3  . potassium chloride SA (K-DUR,KLOR-CON) 20 MEQ tablet Take 20 mEq by mouth 2 (two) times daily.      . Rivaroxaban (XARELTO) 20 MG TABS tablet Take 20 mg by mouth every morning.      Marland Kitchen spironolactone (ALDACTONE) 25 MG tablet Take 25 mg by mouth daily.      Marland Kitchen sulfamethoxazole-trimethoprim (SEPTRA DS) 800-160 MG per tablet Take 1 tablet by mouth 2 (two) times daily.  14 tablet  0  . torsemide (DEMADEX) 100 MG tablet Take 1 tablet (100 mg total) by mouth 2 (two) times daily.  180 tablet  3  . [DISCONTINUED] QUEtiapine (SEROQUEL) 100 MG tablet Take 400 mg by mouth at bedtime.        No current facility-administered medications on file prior to visit.    Assessment/Plan

## 2013-10-04 NOTE — Assessment & Plan Note (Signed)
The patient's weight has increased, since stopping her diuretics.  The patient has now restarted these, as discussed at our last visit (metolzone decreased to twice/week). -pt to return in 1 week for repeat BMET

## 2013-10-08 ENCOUNTER — Other Ambulatory Visit: Payer: Self-pay | Admitting: *Deleted

## 2013-10-08 NOTE — Telephone Encounter (Signed)
Pt test 4 times a day 

## 2013-10-09 ENCOUNTER — Other Ambulatory Visit: Payer: Self-pay | Admitting: *Deleted

## 2013-10-09 MED ORDER — GLUCOSE BLOOD VI STRP: ORAL_STRIP | Status: DC

## 2013-10-09 NOTE — Telephone Encounter (Signed)
Must have diag code and insulin dependency for insurance to pay

## 2013-10-10 ENCOUNTER — Encounter: Payer: Self-pay | Admitting: Internal Medicine

## 2013-10-10 ENCOUNTER — Ambulatory Visit (INDEPENDENT_AMBULATORY_CARE_PROVIDER_SITE_OTHER): Payer: Medicare Other | Admitting: Internal Medicine

## 2013-10-10 ENCOUNTER — Ambulatory Visit: Payer: Medicare Other | Admitting: Internal Medicine

## 2013-10-10 VITALS — BP 121/66 | HR 85 | Temp 97.7°F | Ht 66.0 in | Wt 363.2 lb

## 2013-10-10 DIAGNOSIS — I129 Hypertensive chronic kidney disease with stage 1 through stage 4 chronic kidney disease, or unspecified chronic kidney disease: Secondary | ICD-10-CM

## 2013-10-10 DIAGNOSIS — M069 Rheumatoid arthritis, unspecified: Secondary | ICD-10-CM

## 2013-10-10 DIAGNOSIS — I10 Essential (primary) hypertension: Secondary | ICD-10-CM

## 2013-10-10 DIAGNOSIS — I5032 Chronic diastolic (congestive) heart failure: Secondary | ICD-10-CM

## 2013-10-10 DIAGNOSIS — N183 Chronic kidney disease, stage 3 unspecified: Secondary | ICD-10-CM

## 2013-10-10 MED ORDER — GLUCOSE BLOOD VI STRP: ORAL_STRIP | Status: DC

## 2013-10-10 NOTE — Assessment & Plan Note (Signed)
BP Readings from Last 3 Encounters:  10/10/13 121/66  10/04/13 108/70  10/02/13 131/65    Lab Results  Component Value Date   NA 135 09/30/2013   K 3.3* 09/30/2013   CREATININE 2.28* 09/30/2013    Assessment: Blood pressure control:  Controlled Progress toward BP goal:    at goal Comments: Complaint with meds.  Plan: Medications:  continue current medications Educational resources provided:   Self management tools provided:   Other plans: Bmet today.

## 2013-10-10 NOTE — Progress Notes (Signed)
Patient ID: [name], female   DOB: Apr 24, 1961, 52 y.o.   MRN: [id number]   Subjective:   Patient ID: [name] female   DOB: [birth year] 53 y.o.   MRN: [id number]  HPI: [name] is a 53 y.o. PMH of CMP- EF- 06/04/2013- 50-55%, hypertension , Diabetes mellitus type II, RA, DVT- chronic anticoagulant. Presented to the clinic followup visits. Requests refill of glucometer strips, and asked about refill of her chronic pain medication. Please see problem based testing for review of patient's chronic medical condition.  Past Medical History  Diagnosis Date  . Severe mitral regurgitation     a. Minimally invasive MV repair on 12/18/08. Additionally TV repair  and PFO closure.  . Chronic systolic CHF (congestive heart failure)     a. 12/2011 Echo: EF 30%, diff HK, mild LVH, Gr 1 DD, mildly dil LA. b. 2/2 NICM.  Marland Kitchen Microcytic anemia     a. Small capsule endoscopy 2012 with gastric erosion and small colonic AVMs. b. EGD and colonoscopy before that in 12/11 did not reveal source of bleeding)  . Gastric ulcer   . NICM (nonischemic cardiomyopathy)     a.12/2011: s/p SJM [id number] single lead ICD, Ser #: [id number]  . Obstructive sleep apnea     on CPAP  . GERD (gastroesophageal reflux disease)   . HTN (hypertension)   . Gout   . Depression   . Colitis 2/11  . Chronic back pain   . Chronic knee pain   . DVT (deep venous thrombosis)     right leg  . Anxiety   . Rheumatoid arthritis(714.0)     "shoulders & knees"  . Sleep disturbance     07/14/11 "haven't slept in 10 months; since going to [facility]"  . Hx: UTI (urinary tract infection)   . Nocturia   . COPD (chronic obstructive pulmonary disease)   . Bronchitis   . PTSD (post-traumatic stress disorder)   . Schizophrenia   . Dysmenorrhea   . Morbid obesity   . Acute renal insufficiency     a. ?Cardiorenal 09/2012.  Marland Kitchen Hyperglycemia   . Automatic implantable cardioverter-defibrillator in situ   . Chronic diastolic HF  (heart failure)     a. ECHO (05/2013) EF 50-55%, RV dilated and KH   Current Outpatient Prescriptions  Medication Sig Dispense Refill  . albuterol (PROAIR HFA) 108 (90 BASE) MCG/ACT inhaler Inhale 2 puffs into the lungs every 6 (six) hours as needed for wheezing or shortness of breath.  6.7 g  2  . amitriptyline (ELAVIL) 150 MG tablet Take 1 tablet (150 mg total) by mouth at bedtime.  30 tablet  1  . atorvastatin (LIPITOR) 40 MG tablet Take 1 tablet (40 mg total) by mouth daily.  30 tablet  11  . carvedilol (COREG) 12.5 MG tablet Take 1.5 tablets (18.75 mg total) by mouth 2 (two) times daily with a meal.  90 tablet  3  . diclofenac sodium (VOLTAREN) 1 % GEL Apply 2 g topically 4 (four) times daily.  100 g  2  . febuxostat (ULORIC) 40 MG tablet Take 80 mg by mouth daily.      Marland Kitchen gabapentin (NEURONTIN) 100 MG capsule Take 1 capsule (100 mg total) by mouth 3 (three) times daily.  90 capsule  0  . glucose blood (FREESTYLE LITE) test strip Use as instructed  100 each  12  . hydrALAZINE (APRESOLINE) 50 MG tablet Take 1 tablet (50 mg total) by mouth 3 (  three) times daily.  90 tablet  6  . HYDROcodone-acetaminophen (NORCO) 10-325 MG per tablet Take 1 tablet by mouth every 6 (six) hours as needed for severe pain.  120 tablet  0  . HYDROcodone-acetaminophen (NORCO) 10-325 MG per tablet Take 1 tablet by mouth every 6 (six) hours as needed.  10 tablet  0  . Insulin Detemir (LEVEMIR) 100 UNIT/ML Pen Inject 35 Units into the skin daily at 10 pm.  45 mL  11  . isosorbide mononitrate (IMDUR) 60 MG 24 hr tablet Take 90 mg by mouth daily.      . Miconazole Nitrate 2 % POWD Apply twice per day to groin skin folds  500 g  2  . omeprazole (PRILOSEC) 40 MG capsule Take 1 capsule (40 mg total) by mouth daily.  30 capsule  3  . potassium chloride SA (K-DUR,KLOR-CON) 20 MEQ tablet Take 20 mEq by mouth 2 (two) times daily.      . predniSONE (DELTASONE) 20 MG tablet Take 1 tablet (20 mg total) by mouth daily.  3 tablet  0    . Rivaroxaban (XARELTO) 20 MG TABS tablet Take 20 mg by mouth every morning.      Marland Kitchen spironolactone (ALDACTONE) 25 MG tablet Take 25 mg by mouth daily.      Marland Kitchen sulfamethoxazole-trimethoprim (SEPTRA DS) 800-160 MG per tablet Take 1 tablet by mouth 2 (two) times daily.  14 tablet  0  . torsemide (DEMADEX) 100 MG tablet Take 1 tablet (100 mg total) by mouth 2 (two) times daily.  180 tablet  3  . [DISCONTINUED] QUEtiapine (SEROQUEL) 100 MG tablet Take 400 mg by mouth at bedtime.        No current facility-administered medications for this visit.   Family History  Problem Relation Age of Onset  . Hypertension Sister   . Alcoholism Father     died in his 71's or 82's  . Other Mother     alive & well @ 66.   History   Social History  . Marital Status: Single    Spouse Name: N/A    Number of Children: N/A  . Years of Education: N/A   Occupational History  . disability    Social History Main Topics  . Smoking status: Former Smoker -- 0.50 packs/day for 27 years    Types: Cigarettes    Quit date: 12/18/2008  . Smokeless tobacco: Never Used  . Alcohol Use: No     Comment: "stopped drinking alcohol 12/2003; did drink ~ 6 shots/wk"  . Drug Use: No     Comment: "last cocaine use 2009"  . Sexual Activity: No   Other Topics Concern  . None   Social History Narrative   Pt lives in [place] with her dtr.  On disability.   Review of Systems: CONSTITUTIONAL- Obese-morbid, No Fever, night sweat or change in appetite. SKIN- No Rash, colour changes or itching. HEAD- No Headache or dizziness. EYES- No Vision loss, pain, redness, double or blurred vision. RESPIRATORY- No Cough or SOB. CARDIAC- No Palpitations, DOE, PND or chest pain. GI- No nausea, vomiting, diarrhoea, constipation, abd pain, but complaints of a bump in her abdomen, which she could find to show me on exam, as patient is obese with panus. URINARY- No Frequency, urgency, straining or dysuria. NEUROLOGIC- No Numbness, syncope,  seizures or burning. [facility]- Denies depression or anxiety.   Objective:  Physical Exam: Filed Vitals:   10/10/13 1552  BP: 121/66  Pulse: 85  Temp:  97.7 F (36.5 C)  TempSrc: Oral  Height: 5\' 6"  (1.676 m)  Weight: 363 lb 3.2 oz (164.746 kg)  SpO2: 96%   GENERAL- alert, co-operative, appears as stated age, not in any distress, obese. HEENT- Atraumatic, normocephalic, PERRL, EOMI, oral mucosa appears moist, neck supple. CARDIAC- RRR, murmur- not appreciated, rubs or gallops. RESP- Moving equal volumes of air, and clear to auscultation bilaterally, no wheezes or crackles. ABDOMEN- Soft, nontender, no guarding or rebound, no palpable masses or organomegaly, bowel sounds present. BACK- Normal curvature of the spine, No tenderness along the vertebrae, no CVA tenderness. NEURO- No obvious Cr N abnormality, strenght upper and lower extremities- 5/5. EXTREMITIES- pulse 2+, symmetric, mild pedal edema, mildly pitting. SKIN- Warm, dry, No rash or lesion. PSYCH- Normal mood and affect, appropriate thought content and speech.   Assessment & Plan:   The patient's case and plan of care was discussed with attending physician, Dr. .  Please see problem based charting for assessment and plan.

## 2013-10-10 NOTE — Patient Instructions (Signed)
General Instructions: We will do a blood test today to determine your kidney function and we will call you if we need to adjust the dose of any of the medications you are currently taking.  Please continue taking your insulin as prescribed. We have called in refills for your strips.   Please bring your medicines with you each time you come to clinic.  Medicines may include prescription medications, over-the-counter medications, herbal remedies, eye drops, vitamins, or other pills.   Progress Toward Treatment Goals:  Treatment Goal 10/04/2013  Hemoglobin A1C at goal  Blood pressure at goal  Prevent falls unchanged    Self Care Goals & Plans:  Self Care Goal 10/04/2013  Manage my medications take my medicines as prescribed; bring my medications to every visit; refill my medications on time  Monitor my health keep track of my blood glucose; bring my glucose meter and log to each visit  Eat healthy foods drink diet soda or water instead of juice or soda; eat more vegetables; eat foods that are low in salt; eat baked foods instead of fried foods; eat fruit for snacks and desserts  Be physically active -  Meeting treatment goals -    Home Blood Glucose Monitoring 09/30/2013  Check my blood sugar 2 times a day  When to check my blood sugar before meals

## 2013-10-10 NOTE — Assessment & Plan Note (Signed)
Patient sees a rheumatologist. Patient currently says she is having some shoulder pains and is typically how her RA flares. Patient is on chronic pain medication- Norco 10-325mg . Encouraged patient to call her rheumatologist a set up an appointment.

## 2013-10-10 NOTE — Assessment & Plan Note (Signed)
Patient presented today for followup beam including reduction of metolazone to 2.5 mg twice a week Mondays and Fridays, from 4 times a week, the patient was also to continue high spironolactone 25 mg daily torsemide 100 mg twice a day. Today patient's weight is down by 9 pounds, from 372 last week- 10/04/2013 to 363 today. Patient reports improvement in leg swelling, no difficulty breathing.    Plan- Bmet today to ensure improvement in kidney function.  - Continue current medication.

## 2013-10-11 ENCOUNTER — Other Ambulatory Visit: Payer: Self-pay | Admitting: *Deleted

## 2013-10-11 ENCOUNTER — Other Ambulatory Visit: Payer: Self-pay | Admitting: Internal Medicine

## 2013-10-11 DIAGNOSIS — M25561 Pain in right knee: Secondary | ICD-10-CM

## 2013-10-11 DIAGNOSIS — M1710 Unilateral primary osteoarthritis, unspecified knee: Secondary | ICD-10-CM

## 2013-10-11 DIAGNOSIS — E119 Type 2 diabetes mellitus without complications: Secondary | ICD-10-CM

## 2013-10-11 DIAGNOSIS — M25562 Pain in left knee: Principal | ICD-10-CM

## 2013-10-11 LAB — BASIC METABOLIC PANEL WITH GFR
BUN: 48 mg/dL — AB (ref 6–23)
CALCIUM: 9.5 mg/dL (ref 8.4–10.5)
CO2: 30 mEq/L (ref 19–32)
CREATININE: 1.74 mg/dL — AB (ref 0.50–1.10)
Chloride: 93 mEq/L — ABNORMAL LOW (ref 96–112)
GFR, Est African American: 38 mL/min — ABNORMAL LOW
GFR, Est Non African American: 33 mL/min — ABNORMAL LOW
Glucose, Bld: 81 mg/dL (ref 70–99)
Potassium: 3.5 mEq/L (ref 3.5–5.3)
Sodium: 136 mEq/L (ref 135–145)

## 2013-10-11 MED ORDER — GLUCOSE BLOOD VI STRP: ORAL_STRIP | Status: DC

## 2013-10-11 NOTE — Progress Notes (Signed)
INTERNAL MEDICINE TEACHING ATTENDING ADDENDUM - Lisvet Rasheed, MD: I reviewed and discussed with the resident Dr. Emokpae, the patient's medical history, physical examination, diagnosis and results of pertinent tests and treatment and I agree with the patient's care as documented. 

## 2013-10-14 ENCOUNTER — Telehealth: Payer: Self-pay | Admitting: Cardiology

## 2013-10-14 ENCOUNTER — Telehealth: Payer: Self-pay | Admitting: *Deleted

## 2013-10-14 MED ORDER — HYDROCODONE-ACETAMINOPHEN 10-325 MG PO TABS: 1.0000 | ORAL_TABLET | Freq: Four times a day (QID) | ORAL | Status: DC | PRN

## 2013-10-14 NOTE — Telephone Encounter (Signed)
Rx ready - pt called/informed. 

## 2013-10-14 NOTE — Telephone Encounter (Signed)
Beth with [facility] [phone] called about refill on pain med. Stated pt fell last PM and called EMS. Hit head and back on floor. Had accident with walker - offered appt. Another triage nurse called pt about Rx this PM - ready. [facility] RN 10/14/13 4PM

## 2013-10-14 NOTE — Telephone Encounter (Signed)
New Message:  [name] is asking if Dr. Shirlee Latch has completed the orders she faxed in April. She is refaxing them now and would like to her back from the nurse when they are completed

## 2013-10-16 ENCOUNTER — Encounter: Payer: Self-pay | Admitting: Licensed Clinical Social Worker
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
## 2013-10-18 ENCOUNTER — Ambulatory Visit: Payer: Medicaid Other | Admitting: Internal Medicine

## 2013-10-21 ENCOUNTER — Telehealth: Payer: Self-pay | Admitting: Internal Medicine

## 2013-10-21 MED ORDER — AMITRIPTYLINE HCL 150 MG PO TABS: 150.0000 mg | ORAL_TABLET | Freq: Every day | ORAL | Status: DC

## 2013-10-21 NOTE — Telephone Encounter (Signed)
  INTERNAL MEDICINE RESIDENCY PROGRAM After-Hours Telephone Call    Reason for call:   I received a call from [name] at 8:33 PM, 10/21/2013 indicating need for medication refill.    Pertinent Data:   The patient notes she ran out of her Amitriptyline.  Chart review reveals this was refilled 2 months ago, for a 2 month supply, and is still active on her med list.    Assessment / Plan / Recommendations:   As this is a current medication, and the refill is at an appropriate interval, I have refilled this for a 77-month supply.  As always, pt is advised that if symptoms worsen or new symptoms arise, they should go to an urgent care facility or to to ER for further evaluation.    Linward Headland, MD   10/21/2013, 8:33 PM

## 2013-10-21 NOTE — Telephone Encounter (Signed)
All orders have been signed and faxed back to [facility]

## 2013-10-23 ENCOUNTER — Ambulatory Visit (INDEPENDENT_AMBULATORY_CARE_PROVIDER_SITE_OTHER): Payer: Medicare Other | Admitting: *Deleted

## 2013-10-23 DIAGNOSIS — I428 Other cardiomyopathies: Secondary | ICD-10-CM

## 2013-10-23 DIAGNOSIS — I509 Heart failure, unspecified: Secondary | ICD-10-CM

## 2013-10-23 DIAGNOSIS — I5032 Chronic diastolic (congestive) heart failure: Secondary | ICD-10-CM

## 2013-10-23 NOTE — Progress Notes (Signed)
Remote ICD transmission.   

## 2013-10-24 ENCOUNTER — Encounter: Payer: Medicare Other | Attending: General Surgery | Admitting: Dietician

## 2013-10-24 DIAGNOSIS — E119 Type 2 diabetes mellitus without complications: Secondary | ICD-10-CM | POA: Diagnosis not present

## 2013-10-24 DIAGNOSIS — Z713 Dietary counseling and surveillance: Secondary | ICD-10-CM | POA: Insufficient documentation

## 2013-10-24 NOTE — Progress Notes (Signed)
  Medical Nutrition Therapy:  Appt start time: 315 end time:  330.  3rd SWL visit: [name] returns today for her 3rd SWL visit. She has lost 11 pounds since her last visit. She reports not drinking sodas and not having sweets and having more baked foods. She had a lot of watermelon recently despite her fluid restriction. Unable to walk much due to recent gout flare-up. She plans to begin walking again soon. Plans to get another set of dentures if she is unable to chew adequately. Provided another copy of pre op goals.  Preferred Learning Style:  No preference indicated   Learning Readiness:  Ready  MEDICATIONS: see list   DIETARY INTAKE:  24-hr recall:  B ( AM): flavored oatmeal  Snk ( AM): none  L ( PM): leftovers from the night before Snk ( PM): none D ( PM): fish, potatoes, green beans Snk ( PM): apples and vinegar  Beverages: Crystal Light, ice, coffee  Usual physical activity: chair exercises every other day  Estimated energy needs: 1600 calories  Progress Towards Goal(s):  Some progress.   Nutritional Diagnosis:  [place]-3.3 Overweight/obesity related to past poor dietary habits and physical inactivity as evidenced by patient w/ pending Gastric Sleeve surgery following dietary guidelines for continued weight loss.     Intervention:  Nutrition counseling provided. Reviewed pre-op goals and protein shakes per patient request.  Teaching Method Utilized: Visual Auditory   Barriers to learning/adherence to lifestyle change: physical disability  Demonstrated degree of understanding via:  Teach Back   Monitoring/Evaluation:  Dietary intake, exercise, and body weight in 4 week(s).

## 2013-10-24 NOTE — Patient Instructions (Signed)
-  Continue to limit sweets and portion sizes -Adhere to fluid restriction -Gradually increase chair exercises 

## 2013-10-31 ENCOUNTER — Other Ambulatory Visit: Payer: Self-pay | Admitting: *Deleted

## 2013-11-01 MED ORDER — OMEPRAZOLE 40 MG PO CPDR: 40.0000 mg | DELAYED_RELEASE_CAPSULE | Freq: Every day | ORAL | Status: DC

## 2013-11-04 LAB — MDC_IDC_ENUM_SESS_TYPE_REMOTE
HighPow Impedance: 82 Ohm
Implantable Pulse Generator Serial Number: 1022457
Lead Channel Sensing Intrinsic Amplitude: 10.8 mV
Lead Channel Setting Sensing Sensitivity: 0.5 mV
MDC IDC MSMT BATTERY REMAINING PERCENTAGE: 78 %
MDC IDC MSMT LEADCHNL RV IMPEDANCE VALUE: 540 Ohm
MDC IDC SET LEADCHNL RV PACING AMPLITUDE: 2.5 V
MDC IDC SET LEADCHNL RV PACING PULSEWIDTH: 0.5 ms
Zone Setting Detection Interval: 250 ms
Zone Setting Detection Interval: 300 ms
Zone Setting Detection Interval: 400 ms

## 2013-11-06 ENCOUNTER — Telehealth: Payer: Self-pay | Admitting: Dietician

## 2013-11-06 MED ORDER — GLUCOSE BLOOD VI STRP: ORAL_STRIP | Status: DC

## 2013-11-06 MED ORDER — INSULIN DETEMIR 100 UNIT/ML FLEXPEN: 50.0000 [IU] | PEN_INJECTOR | Freq: Every day | SUBCUTANEOUS | Status: DC

## 2013-11-06 NOTE — Telephone Encounter (Signed)
Out of strips to check her blood sugar, [facility] strips [facility] [place]. They told her to ask [place] to call Medicare? Used too many strips says was told to check blood sugar 4x oper day when she got out of the hospital in April, prescription is for 3x/day, cannot get them filled again until July 2nd.  Last blood sugar was 126, ran out strips Sunday. Feels fine and has continued to take her levemir 50 units at bedtime.   Asked about her dose of Levemir also as her box says 35 units. This dose was changed to 50 units in August 27, 2013 by Dr. Garald Braver. New prescription should be sent to pharmacy for 50 units. Patient has an appointment here on Friday.   Options for blood sugars testing: 1- wait until July 2nd to test and limit testing to 2-3 times a day, 2- loan her a meter on Friday that comes with 10 strips, 3- send in new prescription for test strips( however this may not work if patient mentioned that [facility] told her she needs to turn in a log of her blood sugars to fill rx)   [facility]. They said that if patient is testing <3 times a day, then she doesn't have to complete a log and can pick up 100 strips on July 2nd.

## 2013-11-06 NOTE — Telephone Encounter (Signed)
Sent Levemir 50 units to pharmacy  Freq of testing - her last instruction in May Dr Manson Passey was for testing BID. Do not know why testing QID - maybe bc A1C is 13? Sent in Rx for [facility] for QID testing. If they will not fill, would you be able to supply her with the meter and test strips until July 2nd? I would rather she not stop testing.

## 2013-11-07 ENCOUNTER — Ambulatory Visit (INDEPENDENT_AMBULATORY_CARE_PROVIDER_SITE_OTHER): Payer: Medicare Other | Admitting: Internal Medicine

## 2013-11-07 ENCOUNTER — Other Ambulatory Visit (HOSPITAL_COMMUNITY)
Admission: RE | Admit: 2013-11-07 | Discharge: 2013-11-07 | Disposition: A | Discharge status: Home / Self Care | Payer: Medicare Other | Source: Ambulatory Visit | Attending: Internal Medicine | Admitting: Internal Medicine

## 2013-11-07 ENCOUNTER — Ambulatory Visit: Payer: Medicare Other | Admitting: Internal Medicine

## 2013-11-07 ENCOUNTER — Encounter: Payer: Self-pay | Admitting: Internal Medicine

## 2013-11-07 VITALS — BP 110/82 | HR 96 | Temp 97.9°F | Ht 66.0 in | Wt 361.3 lb

## 2013-11-07 DIAGNOSIS — R8290 Unspecified abnormal findings in urine: Secondary | ICD-10-CM

## 2013-11-07 DIAGNOSIS — IMO0002 Reserved for concepts with insufficient information to code with codable children: Secondary | ICD-10-CM

## 2013-11-07 DIAGNOSIS — M1710 Unilateral primary osteoarthritis, unspecified knee: Secondary | ICD-10-CM

## 2013-11-07 DIAGNOSIS — E1165 Type 2 diabetes mellitus with hyperglycemia: Secondary | ICD-10-CM

## 2013-11-07 DIAGNOSIS — N946 Dysmenorrhea, unspecified: Secondary | ICD-10-CM | POA: Diagnosis present

## 2013-11-07 DIAGNOSIS — N949 Unspecified condition associated with female genital organs and menstrual cycle: Secondary | ICD-10-CM | POA: Diagnosis present

## 2013-11-07 DIAGNOSIS — Z0000 Encounter for general adult medical examination without abnormal findings: Secondary | ICD-10-CM | POA: Diagnosis not present

## 2013-11-07 DIAGNOSIS — Z113 Encounter for screening for infections with a predominantly sexual mode of transmission: Secondary | ICD-10-CM | POA: Insufficient documentation

## 2013-11-07 DIAGNOSIS — E1129 Type 2 diabetes mellitus with other diabetic kidney complication: Secondary | ICD-10-CM

## 2013-11-07 DIAGNOSIS — M25569 Pain in unspecified knee: Secondary | ICD-10-CM | POA: Diagnosis not present

## 2013-11-07 DIAGNOSIS — R102 Pelvic and perineal pain: Secondary | ICD-10-CM

## 2013-11-07 DIAGNOSIS — M25561 Pain in right knee: Secondary | ICD-10-CM

## 2013-11-07 DIAGNOSIS — N760 Acute vaginitis: Secondary | ICD-10-CM | POA: Insufficient documentation

## 2013-11-07 DIAGNOSIS — Z1231 Encounter for screening mammogram for malignant neoplasm of breast: Secondary | ICD-10-CM | POA: Diagnosis not present

## 2013-11-07 DIAGNOSIS — M25562 Pain in left knee: Secondary | ICD-10-CM

## 2013-11-07 LAB — GLUCOSE, CAPILLARY: Glucose-Capillary: 96 mg/dL (ref 70–99)

## 2013-11-07 LAB — POCT URINALYSIS DIPSTICK
Bilirubin, UA: NEGATIVE
Blood, UA: NEGATIVE
Glucose, UA: NEGATIVE
KETONES UA: NEGATIVE
Nitrite, UA: NEGATIVE
PH UA: 6
PROTEIN UA: NEGATIVE
Spec Grav, UA: 1.01
Urobilinogen, UA: 0.2

## 2013-11-07 MED ORDER — NYSTATIN 100000 UNIT/GM EX POWD: 1.0000 | Freq: Two times a day (BID) | CUTANEOUS | Status: DC

## 2013-11-07 MED ORDER — HYDROCODONE-ACETAMINOPHEN 10-325 MG PO TABS: 1.0000 | ORAL_TABLET | Freq: Four times a day (QID) | ORAL | Status: DC | PRN

## 2013-11-07 NOTE — Progress Notes (Signed)
Subjective:    Patient ID: [name], female    DOB: 1961/03/30, 53 y.o.   MRN: [id number]  HPI [name] is a 53 year old woman with a past medical history as listed below who presents acutely for vaginal pain.  The patient states that it started about 3-4 days ago and is "inside my vagina hurting." She states that she has personal care services with a home health nurse had told her that she needed a pelvic exam and therefore the patient is making clear that she would like to be reassured and half this exam performed today. She has not had any abnormal discharge, but does describe a different odor and states "I'm not supposed to have a smell down there." She has not had any fevers or chills, nausea/vomiting or diarrhea, no rashes, no chest pain, shortness of breath, lower extremity edema, or significant pelvic pain. She has not had any new sexual partners, has had a history of normal Pap smears, has not had a history of STI's per the patient's knowledge.  She is also complaining of another gout flare and some chronic knee pain. She has an appointment to see her rheumatologist, Dr. Nickola Major, on 11/08/13.  Past Medical History  Diagnosis Date  . Severe mitral regurgitation     a. Minimally invasive MV repair on 12/18/08. Additionally TV repair  and PFO closure.  . Chronic systolic CHF (congestive heart failure)     a. 12/2011 Echo: EF 30%, diff HK, mild LVH, Gr 1 DD, mildly dil LA. b. 2/2 NICM.  Marland Kitchen Microcytic anemia     a. Small capsule endoscopy 2012 with gastric erosion and small colonic AVMs. b. EGD and colonoscopy before that in 12/11 did not reveal source of bleeding)  . Gastric ulcer   . NICM (nonischemic cardiomyopathy)     a.12/2011: s/p SJM [id number] single lead ICD, Ser #: [id number]  . Obstructive sleep apnea     on CPAP  . GERD (gastroesophageal reflux disease)   . HTN (hypertension)   . Gout   . Depression   . Colitis 2/11  . Chronic back pain   . Chronic knee pain   .  DVT (deep venous thrombosis)     right leg  . Anxiety   . Rheumatoid arthritis(714.0)     "shoulders & knees"  . Sleep disturbance     07/14/11 "haven't slept in 10 months; since going to [facility]"  . Hx: UTI (urinary tract infection)   . Nocturia   . COPD (chronic obstructive pulmonary disease)   . Bronchitis   . PTSD (post-traumatic stress disorder)   . Schizophrenia   . Dysmenorrhea   . Morbid obesity   . Acute renal insufficiency     a. ?Cardiorenal 09/2012.  Marland Kitchen Hyperglycemia   . Automatic implantable cardioverter-defibrillator in situ   . Chronic diastolic HF (heart failure)     a. ECHO (05/2013) EF 50-55%, RV dilated and [facility]   Current Outpatient Prescriptions on File Prior to Visit  Medication Sig Dispense Refill  . albuterol (PROAIR HFA) 108 (90 BASE) MCG/ACT inhaler Inhale 2 puffs into the lungs every 6 (six) hours as needed for wheezing or shortness of breath.  6.7 g  2  . amitriptyline (ELAVIL) 150 MG tablet Take 1 tablet (150 mg total) by mouth at bedtime.  30 tablet  0  . atorvastatin (LIPITOR) 40 MG tablet Take 1 tablet (40 mg total) by mouth daily.  30 tablet  11  . carvedilol (COREG)  12.5 MG tablet Take 1.5 tablets (18.75 mg total) by mouth 2 (two) times daily with a meal.  90 tablet  3  . diclofenac sodium (VOLTAREN) 1 % GEL Apply 2 g topically 4 (four) times daily.  100 g  2  . febuxostat (ULORIC) 40 MG tablet Take 80 mg by mouth daily.      Marland Kitchen gabapentin (NEURONTIN) 100 MG capsule Take 1 capsule (100 mg total) by mouth 3 (three) times daily.  90 capsule  0  . glucose blood (FREESTYLE LITE) test strip Use as instructed  100 each  12  . hydrALAZINE (APRESOLINE) 50 MG tablet Take 1 tablet (50 mg total) by mouth 3 (three) times daily.  90 tablet  6  . HYDROcodone-acetaminophen (NORCO) 10-325 MG per tablet Take 1 tablet by mouth every 6 (six) hours as needed.  10 tablet  0  . HYDROcodone-acetaminophen (NORCO) 10-325 MG per tablet Take 1 tablet by mouth every 6 (six) hours as  needed for severe pain.  120 tablet  0  . Insulin Detemir (LEVEMIR) 100 UNIT/ML Pen Inject 50 Units into the skin daily at 10 pm.  45 mL  1  . isosorbide mononitrate (IMDUR) 60 MG 24 hr tablet Take 90 mg by mouth daily.      . Miconazole Nitrate 2 % POWD Apply twice per day to groin skin folds  500 g  2  . omeprazole (PRILOSEC) 40 MG capsule Take 1 capsule (40 mg total) by mouth daily.  30 capsule  3  . potassium chloride SA (K-DUR,KLOR-CON) 20 MEQ tablet Take 20 mEq by mouth 2 (two) times daily.      . predniSONE (DELTASONE) 20 MG tablet Take 1 tablet (20 mg total) by mouth daily.  3 tablet  0  . Rivaroxaban (XARELTO) 20 MG TABS tablet Take 20 mg by mouth every morning.      Marland Kitchen spironolactone (ALDACTONE) 25 MG tablet Take 25 mg by mouth daily.      Marland Kitchen sulfamethoxazole-trimethoprim (SEPTRA DS) 800-160 MG per tablet Take 1 tablet by mouth 2 (two) times daily.  14 tablet  0  . torsemide (DEMADEX) 100 MG tablet Take 1 tablet (100 mg total) by mouth 2 (two) times daily.  180 tablet  3  . [DISCONTINUED] QUEtiapine (SEROQUEL) 100 MG tablet Take 400 mg by mouth at bedtime.        No current facility-administered medications on file prior to visit.   Social, surgical, family history reviewed with patient and updated in appropriate chart locations.   Review of Systems Negative except As listed in history of present illness     Objective:   Physical Exam Filed Vitals:   11/07/13 1533  BP: 110/82  Pulse: 96  Temp: 97.9 F (36.6 C)   General: sitting in chair, NAD  HEENT: PERRL, EOMI, no scleral icterus Cardiac: RRR, no rubs, murmurs or gallops Pulm: clear to auscultation bilaterally, moving normal volumes of air Abd: soft, nontender, nondistended, BS present Ext: warm and well perfused, no pedal edema GU: pelvic exam revealed no palpable masses, normal estrogenized vaginal wall no ulcerations or tears, no CMA, some white discharge, groin folds covered in powder under which reveal erythematous,  macerated plaques and erosions with fine peripheral scaling and erythematous satellite papules  Neuro: alert and oriented X3, cranial nerves II-XII grossly intact    Assessment & Plan:  Please see problem oriented charting  Pt discussed with Dr. Cyndie Chime

## 2013-11-07 NOTE — Telephone Encounter (Signed)
Hi Donna,    I wasn't sure why I had received this message since I am not this patient's PCP. Do you need me to Rx her strips?   Thanks!  SK

## 2013-11-08 DIAGNOSIS — R102 Pelvic and perineal pain: Secondary | ICD-10-CM | POA: Insufficient documentation

## 2013-11-08 LAB — URINALYSIS, COMPLETE
Bilirubin Urine: NEGATIVE
CASTS: NONE SEEN
Crystals: NONE SEEN
Glucose, UA: NEGATIVE mg/dL
HGB URINE DIPSTICK: NEGATIVE
KETONES UR: NEGATIVE mg/dL
NITRITE: NEGATIVE
Protein, ur: NEGATIVE mg/dL
Specific Gravity, Urine: 1.015 (ref 1.005–1.030)
Urobilinogen, UA: 0.2 mg/dL (ref 0.0–1.0)
pH: 6 (ref 5.0–8.0)

## 2013-11-08 NOTE — Assessment & Plan Note (Addendum)
There does not appear to be any anatomic deformities to suggest vaginal pain, pelvic exam today revealed normal vaginal tissue and only slight discharge, patient reports being treated for recent UTIs but is not having any dysuria or pyuria. Urine dip only revealed some leukocytes. -GC, Chlamydia, wet prep -UA, urine micro,urine culture -will treat what appears to be candidal intertrigo with nystatin powder

## 2013-11-08 NOTE — Progress Notes (Signed)
Attending physician note: Presenting problems, physical findings, medications, reviewed with resident physician Dr. Nora Sadek and I concur with her management. James Granfortuna, M.D., FACP 

## 2013-11-09 LAB — URINE CULTURE: Colony Count: >=100000

## 2013-11-11 ENCOUNTER — Telehealth: Payer: Self-pay | Admitting: Dietician

## 2013-11-11 ENCOUNTER — Telehealth: Payer: Self-pay | Admitting: *Deleted

## 2013-11-11 ENCOUNTER — Telehealth (HOSPITAL_COMMUNITY): Payer: Self-pay | Admitting: Vascular Surgery

## 2013-11-11 NOTE — Addendum Note (Signed)
Addended by: Bufford Spikes on: 11/11/2013 03:32 PM   Modules accepted: Orders

## 2013-11-11 NOTE — Telephone Encounter (Signed)
Call returned to beth with [facility], verbal orders given to resume current standing orders as is.  Beth states patient is doing well and should soon be an every other week visit as long as she continues to progress.  States she had a gout flare up last week and did not take torsemide for 3 days due to inability to ambulate to bathroom.  But weight today was down to 353lbs, no edema, SOB, or swelling noted.

## 2013-11-11 NOTE — Telephone Encounter (Signed)
Beth with [facility] [phone] called  - confused with Levemir dosage. The insulin box states 35 units and pt is doing 50 units at 10PM. Talked with pts pharmacy - they gave her 2 boxes of insulin in May 2015 and pt is unable to get more insulin before 2 months per insurance company - even with change in dose. Pt is to call Lupita Leash Plyler if she needs Levemir beore pharmacy can fill Rx. The dose on Levemir was changed 08/27/2013 to 50 units by Dr Garald Braver per D. Plyler. Stanton Kidney Ditzler RN 11/11/13 4PM

## 2013-11-11 NOTE — Telephone Encounter (Signed)
[facility]despite new Rx, insurance still won't pay for more strips until July 2nd. Called patient, the nurse who visits gave her meter 160 today.  She understands to decrease self monitoring to 2-3 times a days to prevent running out of strips again.

## 2013-11-11 NOTE — Telephone Encounter (Signed)
Beth the nurse from [facility] needS recertification orders to cont. Seeing the pt. Please advise

## 2013-11-12 NOTE — Addendum Note (Signed)
Addended by: Bufford Spikes on: 11/12/2013 02:10 PM   Modules accepted: Orders

## 2013-11-17 DIAGNOSIS — E119 Type 2 diabetes mellitus without complications: Secondary | ICD-10-CM

## 2013-11-17 DIAGNOSIS — I5043 Acute on chronic combined systolic (congestive) and diastolic (congestive) heart failure: Secondary | ICD-10-CM

## 2013-11-17 DIAGNOSIS — I509 Heart failure, unspecified: Secondary | ICD-10-CM

## 2013-11-17 DIAGNOSIS — I129 Hypertensive chronic kidney disease with stage 1 through stage 4 chronic kidney disease, or unspecified chronic kidney disease: Secondary | ICD-10-CM

## 2013-11-19 ENCOUNTER — Encounter: Payer: Self-pay | Admitting: Cardiology

## 2013-11-20 ENCOUNTER — Other Ambulatory Visit: Payer: Self-pay | Admitting: Internal Medicine

## 2013-11-20 ENCOUNTER — Telehealth: Payer: Self-pay | Admitting: Internal Medicine

## 2013-11-20 MED ORDER — AMITRIPTYLINE HCL 150 MG PO TABS: 150.0000 mg | ORAL_TABLET | Freq: Every day | ORAL | Status: DC

## 2013-11-20 NOTE — Telephone Encounter (Signed)
  INTERNAL MEDICINE RESIDENCY PROGRAM After-Hours Telephone Call    Reason for call:   I received a call from [name] at 9:11 PM, 11/20/2013 indicating that she had run out of her Amitryptiline and cannot sleep without it. .    Pertinent Data:   [place] she didn't have any refills.    Assessment / Plan / Recommendations:   Sent in Rx for refill for Amitryptiline 150 mg qhs #30  As always, pt is advised that if symptoms worsen or new symptoms arise, they should go to an urgent care facility or to to ER for further evaluation.    Courtney Paris, MD   11/20/2013, 9:11 PM

## 2013-11-21 ENCOUNTER — Telehealth (HOSPITAL_COMMUNITY): Payer: Self-pay | Admitting: *Deleted

## 2013-11-21 ENCOUNTER — Observation Stay (HOSPITAL_COMMUNITY)
Admission: EM | Admit: 2013-11-21 | Discharge: 2013-11-22 | Disposition: A | Discharge status: Home / Self Care | Payer: Medicare Other | Attending: Internal Medicine | Admitting: Internal Medicine

## 2013-11-21 ENCOUNTER — Encounter (HOSPITAL_COMMUNITY): Payer: Self-pay | Admitting: Emergency Medicine

## 2013-11-21 DIAGNOSIS — I129 Hypertensive chronic kidney disease with stage 1 through stage 4 chronic kidney disease, or unspecified chronic kidney disease: Secondary | ICD-10-CM | POA: Diagnosis not present

## 2013-11-21 DIAGNOSIS — I10 Essential (primary) hypertension: Secondary | ICD-10-CM | POA: Diagnosis not present

## 2013-11-21 DIAGNOSIS — Z95810 Presence of automatic (implantable) cardiac defibrillator: Secondary | ICD-10-CM | POA: Diagnosis not present

## 2013-11-21 DIAGNOSIS — I509 Heart failure, unspecified: Secondary | ICD-10-CM | POA: Insufficient documentation

## 2013-11-21 DIAGNOSIS — F411 Generalized anxiety disorder: Secondary | ICD-10-CM | POA: Insufficient documentation

## 2013-11-21 DIAGNOSIS — E876 Hypokalemia: Secondary | ICD-10-CM | POA: Diagnosis not present

## 2013-11-21 DIAGNOSIS — I059 Rheumatic mitral valve disease, unspecified: Secondary | ICD-10-CM | POA: Insufficient documentation

## 2013-11-21 DIAGNOSIS — K625 Hemorrhage of anus and rectum: Principal | ICD-10-CM | POA: Diagnosis present

## 2013-11-21 DIAGNOSIS — G4733 Obstructive sleep apnea (adult) (pediatric): Secondary | ICD-10-CM | POA: Insufficient documentation

## 2013-11-21 DIAGNOSIS — F329 Major depressive disorder, single episode, unspecified: Secondary | ICD-10-CM | POA: Diagnosis not present

## 2013-11-21 DIAGNOSIS — G8929 Other chronic pain: Secondary | ICD-10-CM | POA: Insufficient documentation

## 2013-11-21 DIAGNOSIS — J449 Chronic obstructive pulmonary disease, unspecified: Secondary | ICD-10-CM | POA: Diagnosis not present

## 2013-11-21 DIAGNOSIS — N183 Chronic kidney disease, stage 3 unspecified: Secondary | ICD-10-CM | POA: Diagnosis present

## 2013-11-21 DIAGNOSIS — I82409 Acute embolism and thrombosis of unspecified deep veins of unspecified lower extremity: Secondary | ICD-10-CM | POA: Insufficient documentation

## 2013-11-21 DIAGNOSIS — J4489 Other specified chronic obstructive pulmonary disease: Secondary | ICD-10-CM | POA: Insufficient documentation

## 2013-11-21 DIAGNOSIS — M069 Rheumatoid arthritis, unspecified: Secondary | ICD-10-CM | POA: Diagnosis not present

## 2013-11-21 DIAGNOSIS — K219 Gastro-esophageal reflux disease without esophagitis: Secondary | ICD-10-CM | POA: Insufficient documentation

## 2013-11-21 DIAGNOSIS — K5289 Other specified noninfective gastroenteritis and colitis: Secondary | ICD-10-CM | POA: Diagnosis not present

## 2013-11-21 DIAGNOSIS — D509 Iron deficiency anemia, unspecified: Secondary | ICD-10-CM | POA: Diagnosis not present

## 2013-11-21 DIAGNOSIS — I5032 Chronic diastolic (congestive) heart failure: Secondary | ICD-10-CM | POA: Diagnosis present

## 2013-11-21 DIAGNOSIS — F3289 Other specified depressive episodes: Secondary | ICD-10-CM | POA: Diagnosis not present

## 2013-11-21 DIAGNOSIS — F209 Schizophrenia, unspecified: Secondary | ICD-10-CM | POA: Diagnosis not present

## 2013-11-21 DIAGNOSIS — R197 Diarrhea, unspecified: Secondary | ICD-10-CM | POA: Diagnosis not present

## 2013-11-21 DIAGNOSIS — E1121 Type 2 diabetes mellitus with diabetic nephropathy: Secondary | ICD-10-CM | POA: Diagnosis present

## 2013-11-21 LAB — COMPREHENSIVE METABOLIC PANEL
ALT: 43 U/L — ABNORMAL HIGH (ref 0–35)
AST: 20 U/L (ref 0–37)
Albumin: 3.1 g/dL — ABNORMAL LOW (ref 3.5–5.2)
Alkaline Phosphatase: 129 U/L — ABNORMAL HIGH (ref 39–117)
Anion gap: 14 (ref 5–15)
BUN: 23 mg/dL (ref 6–23)
CALCIUM: 9.3 mg/dL (ref 8.4–10.5)
CO2: 27 mEq/L (ref 19–32)
Chloride: 100 mEq/L (ref 96–112)
Creatinine, Ser: 1.09 mg/dL (ref 0.50–1.10)
GFR calc Af Amer: 66 mL/min — ABNORMAL LOW (ref >90)
GFR calc non Af Amer: 57 mL/min — ABNORMAL LOW (ref >90)
Glucose, Bld: 119 mg/dL — ABNORMAL HIGH (ref 70–99)
Potassium: 3.5 mEq/L — ABNORMAL LOW (ref 3.7–5.3)
Sodium: 141 mEq/L (ref 137–147)
Total Bilirubin: 0.3 mg/dL (ref 0.3–1.2)
Total Protein: 8 g/dL (ref 6.0–8.3)

## 2013-11-21 LAB — CBC
HCT: 37.8 % (ref 36.0–46.0)
Hemoglobin: 11.7 g/dL — ABNORMAL LOW (ref 12.0–15.0)
MCH: 25.2 pg — ABNORMAL LOW (ref 26.0–34.0)
MCHC: 31 g/dL (ref 30.0–36.0)
MCV: 81.5 fL (ref 78.0–100.0)
PLATELETS: 255 10*3/uL (ref 150–400)
RBC: 4.64 MIL/uL (ref 3.87–5.11)
RDW: 17.9 % — ABNORMAL HIGH (ref 11.5–15.5)
WBC: 7.9 10*3/uL (ref 4.0–10.5)

## 2013-11-21 LAB — APTT: APTT: 39 s — AB (ref 24–37)

## 2013-11-21 LAB — PROTIME-INR
INR: 1.57 — ABNORMAL HIGH (ref 0.00–1.49)
PROTHROMBIN TIME: 18.8 s — AB (ref 11.6–15.2)

## 2013-11-21 LAB — I-STAT CG4 LACTIC ACID, ED: LACTIC ACID, VENOUS: 1.96 mmol/L (ref 0.5–2.2)

## 2013-11-21 LAB — POC OCCULT BLOOD, ED: FECAL OCCULT BLD: POSITIVE — AB

## 2013-11-21 MED ORDER — METOLAZONE 5 MG PO TABS: 5.0000 mg | ORAL_TABLET | ORAL | Status: DC

## 2013-11-21 NOTE — ED Notes (Signed)
Bed: [facility] Expected date: 11/21/13 Expected time: 7:47 PM Means of arrival: Ambulance Comments: Rectal bleeding

## 2013-11-21 NOTE — Telephone Encounter (Signed)
Beth, RN with [facility] called to report pts wt is up, she states last week pt weighed 341 lbs and then she went to [place] with a friend and has not taken any of her meds in a week and today her wt is 360 lbs, pt did restart meds today including Torsemide 100 mg Twice daily, advised to have pt take metolazone today then continue regular dosing of 2 times a week, if wt does not start coming down Beth will call us back

## 2013-11-21 NOTE — ED Provider Notes (Signed)
CSN: [id number]     Arrival date & time 11/21/13  2005 History   First MD Initiated Contact with Patient 11/21/13 2023     Chief Complaint  Patient presents with  . Rectal Bleeding     (Consider location/radiation/quality/duration/timing/severity/associated sxs/prior Treatment) Patient is a 53 y.o. female presenting with hematochezia. The history is provided by the patient.  Rectal Bleeding Quality:  Bright red Amount:  Moderate Timing:  Intermittent Progression:  Worsening Chronicity:  New Context: diarrhea   Context: not anal fissures, not anal penetration and not foreign body   Similar prior episodes: no   Relieved by:  Nothing Worsened by:  Nothing tried Associated symptoms: abdominal pain (crampy)   Associated symptoms: no fever and no vomiting     Past Medical History  Diagnosis Date  . Severe mitral regurgitation     a. Minimally invasive MV repair on 12/18/08. Additionally TV repair  and PFO closure.  . Chronic systolic CHF (congestive heart failure)     a. 12/2011 Echo: EF 30%, diff HK, mild LVH, Gr 1 DD, mildly dil LA. b. 2/2 NICM.  Marland Kitchen Microcytic anemia     a. Small capsule endoscopy 2012 with gastric erosion and small colonic AVMs. b. EGD and colonoscopy before that in 12/11 did not reveal source of bleeding)  . Gastric ulcer   . NICM (nonischemic cardiomyopathy)     a.12/2011: s/p SJM 1311-36Q single lead ICD, Ser #: [id number]  . Obstructive sleep apnea     on CPAP  . GERD (gastroesophageal reflux disease)   . HTN (hypertension)   . Gout   . Depression   . Colitis 2/11  . Chronic back pain   . Chronic knee pain   . DVT (deep venous thrombosis)     right leg  . Anxiety   . Rheumatoid arthritis(714.0)     "shoulders & knees"  . Sleep disturbance     07/14/11 "haven't slept in 10 months; since going to [facility]"  . Hx: UTI (urinary tract infection)   . Nocturia   . COPD (chronic obstructive pulmonary disease)   . Bronchitis   . PTSD (post-traumatic stress disorder)    . Schizophrenia   . Dysmenorrhea   . Morbid obesity   . Acute renal insufficiency     a. ?Cardiorenal 09/2012.  Marland Kitchen Hyperglycemia   . Automatic implantable cardioverter-defibrillator in situ   . Chronic diastolic HF (heart failure)     a. ECHO (05/2013) EF 50-55%, RV dilated and [facility]   Past Surgical History  Procedure Laterality Date  . Cardiac valve replacement  12/2008    cardiac  . Cardiac catheterization  04/20/2009, 11/04/2008  . Right knee arthroscopy  06/11/2004  . Extraction of teeth  12/04/2008  . Right mini thoracotomy for mitral valve repair and closure of foreman ovale  12/18/2008  . Cardiac defibrillator placement     Family History  Problem Relation Age of Onset  . Hypertension Sister   . Alcoholism Father     died in his 10's or 70's  . Other Mother     alive & well @ 54.   History  Substance Use Topics  . Smoking status: Former Smoker -- 0.50 packs/day for 27 years    Types: Cigarettes    Quit date: 12/18/2008  . Smokeless tobacco: Never Used  . Alcohol Use: No     Comment: "stopped drinking alcohol 12/2003; did drink ~ 6 shots/wk"   OB History   Grav Para Term Preterm  Abortions TAB SAB Ect Mult Living   5 5 5       5      Review of Systems  Constitutional: Negative for fever.  Respiratory: Negative for cough and shortness of breath.   Gastrointestinal: Positive for abdominal pain (crampy) and hematochezia. Negative for nausea and vomiting.  All other systems reviewed and are negative.     Allergies  Risperidone; Colchicine; Morphine; and Shrimp  Home Medications   Prior to Admission medications   Medication Sig Start Date End Date Taking? Authorizing Provider  albuterol (PROAIR HFA) 108 (90 BASE) MCG/ACT inhaler Inhale 2 puffs into the lungs every 6 (six) hours as needed for wheezing or shortness of breath. 08/27/13  Yes 08/29/13, MD  amitriptyline (ELAVIL) 150 MG tablet Take 1 tablet (150 mg total) by mouth at bedtime. 11/20/13  Yes 01/21/14, MD  atorvastatin (LIPITOR) 40 MG tablet Take 1 tablet (40 mg total) by mouth daily. 10/01/13 10/01/14 Yes 10/03/14, MD  carvedilol (COREG) 12.5 MG tablet Take 1.5 tablets (18.75 mg total) by mouth 2 (two) times daily with a meal. 07/01/13  Yes 07/03/13, NP  diclofenac sodium (VOLTAREN) 1 % GEL Apply 2 g topically 4 (four) times daily. 10/04/13  Yes 10/06/13, MD  febuxostat (ULORIC) 40 MG tablet Take 80 mg by mouth daily.   Yes Historical Provider, MD  gabapentin (NEURONTIN) 100 MG capsule Take 1 capsule (100 mg total) by mouth 3 (three) times daily. 09/30/13 09/30/14 Yes 10/02/14, MD  glucose blood (FREESTYLE LITE) test strip Use as instructed 11/06/13  Yes 11/08/13, MD  hydrALAZINE (APRESOLINE) 50 MG tablet Take 1 tablet (50 mg total) by mouth 3 (three) times daily. 01/08/13  Yes 01/10/13, MD  HYDROcodone-acetaminophen [facility]) 10-325 MG per tablet Take 1 tablet by mouth every 6 (six) hours as needed for severe pain. 11/07/13  Yes 11/09/13, MD  Insulin Detemir (LEVEMIR) 100 UNIT/ML Pen Inject 50 Units into the skin daily at 10 pm. 11/06/13  Yes 11/08/13, MD  isosorbide mononitrate (IMDUR) 60 MG 24 hr tablet Take 90 mg by mouth daily.   Yes Historical Provider, MD  metolazone (ZAROXOLYN) 5 MG tablet Take 1 tablet (5 mg total) by mouth 2 (two) times a week. 11/21/13  Yes 01/22/14, MD  nystatin (MYCOSTATIN/NYSTOP) 100000 UNIT/GM POWD Apply 1 Bottle topically 2 (two) times daily. 11/07/13  Yes 11/09/13, MD  omeprazole (PRILOSEC) 40 MG capsule Take 1 capsule (40 mg total) by mouth daily.   Yes Christen Bame, MD  potassium chloride SA (K-DUR,KLOR-CON) 20 MEQ tablet Take 20 mEq by mouth 2 (two) times daily.   Yes Historical Provider, MD  predniSONE (DELTASONE) 20 MG tablet Take 1 tablet (20 mg total) by mouth daily. 10/04/13  Yes 10/06/13, MD  Rivaroxaban (XARELTO) 20 MG TABS tablet Take 20 mg by mouth every morning.   Yes Historical Provider, MD   spironolactone (ALDACTONE) 25 MG tablet Take 25 mg by mouth daily.   Yes Historical Provider, MD  torsemide (DEMADEX) 100 MG tablet Take 1 tablet (100 mg total) by mouth 2 (two) times daily. 07/04/13 07/04/14 Yes 07/06/14, NP   BP 130/68  Pulse 88  Temp(Src) 97.9 F (36.6 C) (Oral)  Resp 18  SpO2 98%  LMP 05/05/2012 Physical Exam  Nursing note and vitals reviewed. Constitutional: She is oriented to person, place, and time. She appears well-developed  and well-nourished. No distress.  HENT:  Head: Normocephalic and atraumatic.  Eyes: EOM are normal. Pupils are equal, round, and reactive to light.  Neck: Normal range of motion. Neck supple.  Cardiovascular: Normal rate and regular rhythm.  Exam reveals no friction rub.   No murmur heard. Pulmonary/Chest: Effort normal and breath sounds normal. No respiratory distress. She has no wheezes. She has no rales.  Abdominal: Soft. She exhibits no distension. There is tenderness (mild, diffuse). There is no rebound.  Genitourinary: Rectal exam shows no external hemorrhoid, no mass and anal tone normal. Guaiac positive stool.  Brown stool on rectal exam  Musculoskeletal: Normal range of motion. She exhibits no edema.  Neurological: She is alert and oriented to person, place, and time.  Skin: She is not diaphoretic.    ED Course  Procedures (including critical care time) Labs Review Labs Reviewed  CBC - Abnormal; Notable for the following:    Hemoglobin 11.7 (*)    MCH 25.2 (*)    RDW 17.9 (*)    All other components within normal limits  COMPREHENSIVE METABOLIC PANEL - Abnormal; Notable for the following:    Potassium 3.5 (*)    Glucose, Bld 119 (*)    Albumin 3.1 (*)    ALT 43 (*)    Alkaline Phosphatase 129 (*)    GFR calc non Af Amer 57 (*)    GFR calc Af Amer 66 (*)    All other components within normal limits  PROTIME-INR - Abnormal; Notable for the following:    Prothrombin Time 18.8 (*)    INR 1.57 (*)    All other  components within normal limits  APTT - Abnormal; Notable for the following:    aPTT 39 (*)    All other components within normal limits  POC OCCULT BLOOD, ED - Abnormal; Notable for the following:    Fecal Occult Bld POSITIVE (*)    All other components within normal limits  I-STAT CG4 LACTIC ACID, ED    Imaging Review No results found.   EKG Interpretation None      MDM   Final diagnoses:  Rectal bleeding    53 year old female with history of CHF, mitral regurg presents with rectal bleeding. It was spotting and had a large bowel movement of blood later in the day. She is on xarelto. Denies dizziness, syncope. She is having some crampy abdominal pain. Here vitals are stable. She has brown stool but is Hemoccult positive. No hemorrhoids appreciated. Exam with mild tenderness but no peritoneal signs. Labs stable. Since she is on the results of, I feel she should be admitted and watched with serial hemoglobins and see for rectal bleeding recurs. Admitted to teaching service at [facility]    Dagmar Hait, MD 11/22/13 [id number]

## 2013-11-21 NOTE — ED Notes (Addendum)
Per EMS, pt noticed bright red rectal bleeding today since 3AM. Pt states she has bled ~1 pad worth. Pt complains of going to the bathroom "all day" and incontinence. Pt has a hx of colitis and is allergic to shellfish, states she ate crab over the weekend. Pt is on blood thinners for A-fib.Pt denies weakness.

## 2013-11-21 NOTE — ED Notes (Signed)
[place], EDP made aware of patient CG4 Lactic results. 

## 2013-11-21 NOTE — H&P (Signed)
Date: 11/22/2013               Patient Name:  [name] MRN: [id number]  DOB: 05-28-1960 Age / Sex: 53 y.o., female   PCP: Harold Barban, MD         Medical Service: Internal Medicine Teaching Service         Attending Physician: Dr. Rocco Serene, MD    First Contact: Dr. Leatha Gilding Pager: [phone]  Second Contact: Dr. Virgina Organ Pager: [phone]       After Hours (After 5p/  First Contact Pager: [phone]  weekends / holidays): Second Contact Pager: [phone]   Chief Complaint: rectal bleeding  History of Present Illness: [name] is a 53 y.o. female w/ PMHx of HTN, DM type II, COPD, CHF (EF 50-55%, grade 1 diastolic dysfunction), OSA, gout, RA (RF positive), h/o DVT (seen on LE doppler in 05/2013) on Xarelto, CKD stage 2-3, GERD, PTSD, anxiety, schizophrenia, and depression admitted from [facility] ED for BRBPR. Pt presents to the ED with complaint of rectal bleeding. Pt states she first saw bright red blood on her toilet paper after having a bowel movement at 3 am. She proceeded to see it twice more over the course of the day with the most amount being about a couple of ounces. She states her bowel movements are painful. She denies seeing any clots. She also admits to diarrhea and abdominal cramping for one day. She denies any fever, chills, nausea, vomiting, weakness or dizziness.   Meds: Current Facility-Administered Medications  Medication Dose Route Frequency Provider Last Rate Last Dose  . albuterol (PROVENTIL) (2.5 MG/3ML) 0.083% nebulizer solution 3 mL  3 mL Inhalation Q6H PRN Courtney Paris, MD      . amitriptyline (ELAVIL) tablet 150 mg  150 mg Oral QHS Courtney Paris, MD      . atorvastatin (LIPITOR) tablet 40 mg  40 mg Oral Daily Courtney Paris, MD      . carvedilol (COREG) tablet 18.75 mg  18.75 mg Oral BID WC Courtney Paris, MD      . febuxostat (ULORIC) tablet 80 mg  80 mg Oral Daily Courtney Paris, MD      . gabapentin (NEURONTIN) capsule 100 mg  100 mg Oral TID Courtney Paris, MD      . hydrALAZINE (APRESOLINE) tablet 50 mg  50 mg Oral TID Courtney Paris, MD      . HYDROcodone-acetaminophen [facility]) 10-325 MG per tablet 1 tablet  1 tablet Oral Q6H PRN Courtney Paris, MD      . insulin aspart (novoLOG) injection 0-9 Units  0-9 Units Subcutaneous 6 times per day Courtney Paris, MD      . isosorbide mononitrate (IMDUR) 24 hr tablet 90 mg  90 mg Oral Daily Courtney Paris, MD      . Melene Muller ON 11/25/2013] metolazone (ZAROXOLYN) tablet 5 mg  5 mg Oral Once per day on Mon Thu Eden W Jones, MD      . nystatin (MYCOSTATIN/NYSTOP) topical powder   Topical BID Courtney Paris, MD      . pantoprazole (PROTONIX) EC tablet 40 mg  40 mg Oral Daily Courtney Paris, MD      . potassium chloride SA (K-DUR,KLOR-CON) CR tablet 40 mEq  40 mEq Oral Once Courtney Paris, MD      . predniSONE (DELTASONE) tablet 20 mg  20 mg Oral Q breakfast Courtney Paris, MD      .  rivaroxaban (XARELTO) tablet 20 mg  20 mg Oral Q supper Courtney Paris, MD      . spironolactone (ALDACTONE) tablet 25 mg  25 mg Oral Daily Courtney Paris, MD      . torsemide [facility]) tablet 100 mg  100 mg Oral BID Courtney Paris, MD        Allergies: Allergies as of 11/21/2013 - Review Complete 11/21/2013  Allergen Reaction Noted  . Risperidone Other (See Comments) 03/21/2011  . Colchicine Hives and Itching 11/13/2006  . Morphine Itching 05/21/2010  . Shrimp [shellfish allergy] Hives and Itching 01/25/2012   Past Medical History  Diagnosis Date  . Severe mitral regurgitation     a. Minimally invasive MV repair on 12/18/08. Additionally TV repair  and PFO closure.  . Chronic systolic CHF (congestive heart failure)     a. 12/2011 Echo: EF 30%, diff HK, mild LVH, Gr 1 DD, mildly dil LA. b. 2/2 NICM.  Marland Kitchen Microcytic anemia     a. Small capsule endoscopy 2012 with gastric erosion and small colonic AVMs. b. EGD and colonoscopy before that in 12/11 did not reveal source of bleeding)  . Gastric ulcer   . NICM (nonischemic cardiomyopathy)      a.12/2011: s/p SJM 1311-36Q single lead ICD, Ser #: [id number]  . Obstructive sleep apnea     on CPAP  . GERD (gastroesophageal reflux disease)   . HTN (hypertension)   . Gout   . Depression   . Colitis 2/11  . Chronic back pain   . Chronic knee pain   . DVT (deep venous thrombosis)     right leg  . Anxiety   . Rheumatoid arthritis(714.0)     "shoulders & knees"  . Sleep disturbance     07/14/11 "haven't slept in 10 months; since going to [facility]"  . Hx: UTI (urinary tract infection)   . Nocturia   . COPD (chronic obstructive pulmonary disease)   . Bronchitis   . PTSD (post-traumatic stress disorder)   . Schizophrenia   . Dysmenorrhea   . Morbid obesity   . Acute renal insufficiency     a. ?Cardiorenal 09/2012.  Marland Kitchen Hyperglycemia   . Automatic implantable cardioverter-defibrillator in situ   . Chronic diastolic HF (heart failure)     a. ECHO (05/2013) EF 50-55%, RV dilated and [facility]   Past Surgical History  Procedure Laterality Date  . Cardiac valve replacement  12/2008    cardiac  . Cardiac catheterization  04/20/2009, 11/04/2008  . Right knee arthroscopy  06/11/2004  . Extraction of teeth  12/04/2008  . Right mini thoracotomy for mitral valve repair and closure of foreman ovale  12/18/2008  . Cardiac defibrillator placement     Family History  Problem Relation Age of Onset  . Hypertension Sister   . Alcoholism Father     died in his 44's or 19's  . Other Mother     alive & well @ 33.   History   Social History  . Marital Status: Single    Spouse Name: N/A    Number of Children: N/A  . Years of Education: N/A   Occupational History  . disability    Social History Main Topics  . Smoking status: Former Smoker -- 0.50 packs/day for 27 years    Types: Cigarettes    Quit date: 12/18/2008  . Smokeless tobacco: Never Used  . Alcohol Use: No     Comment: "stopped drinking alcohol 12/2003; did drink ~  6 shots/wk"  . Drug Use: No     Comment: "last cocaine use 2009"  . Sexual  Activity: No   Other Topics Concern  . Not on file   Social History Narrative   Pt lives in [place] with her dtr.  On disability.    Review of Systems: General: Denies fever, chills, diaphoresis, appetite change and fatigue.  Respiratory: Denies SOB, DOE, cough, chest tightness, and wheezing.   Cardiovascular: Denies chest pain and palpitations.  Gastrointestinal: Admits to hematochezia, cramping abdominal pain and diarrhea. Denies nausea, vomiting, constipation, and abdominal distention.  Genitourinary: Denies dysuria, urgency, frequency, hematuria, and flank pain. Endocrine: Denies hot or cold intolerance, polyuria, and polydipsia. Musculoskeletal: Denies myalgias, back pain, joint swelling, arthralgias and gait problem.  Skin: Denies pallor, rash and wounds.  Neurological: Denies dizziness, seizures, syncope, weakness, lightheadedness, numbness and headaches.  Psychiatric/Behavioral: Denies mood changes, confusion, nervousness, sleep disturbance and agitation.  Physical Exam: Filed Vitals:   11/21/13 2012 11/21/13 2348 11/22/13 0140  BP: 130/68 113/85 104/58  Pulse: 88 99 95  Temp: 97.9 F (36.6 C) 98.6 F (37 C) 97.8 F (36.6 C)  TempSrc: Oral Oral Oral  Resp: 18 20 20   Height:   5\' 6"  (1.676 m)  Weight:   356 lb 9.6 oz (161.753 kg)  SpO2: 98% 99% 100%   General: Vital signs reviewed.  Patient is a well-developed and well-nourished female, in no acute distress and cooperative with exam.  Head: Normocephalic and atraumatic. Eyes: PERRL, EOMI, conjunctivae normal, No scleral icterus.  Neck: Supple, trachea midline, normal ROM, No JVD, masses, thyromegaly, or carotid bruit present.  Cardiovascular: RRR, S1 normal, S2 normal, no murmurs, gallops, or rubs. Pulmonary/Chest: CTA bilaterally, no wheezes, rales, or rhonchi. Abdominal: Soft, obese, non-tender, non-distended, BS +, no masses, or guarding present.  Musculoskeletal: No joint deformities, erythema, or stiffness, ROM full  and nontender. Extremities: Trace pitting edema in lower extremities bilaterally,  pulses symmetric and intact bilaterally. No cyanosis or clubbing. Neurological: A&O x3, Strength is normal and symmetric bilaterally, cranial nerve II-XII are grossly intact, no focal motor deficit, sensory intact to light touch bilaterally.  Skin: Warm, dry and intact. No rashes or erythema. Psychiatric: Normal mood and affect. speech and behavior is normal. Cognition and memory are normal.   Lab results: Basic Metabolic Panel:  Recent Labs  40/98/11 2125  NA 141  K 3.5*  CL 100  CO2 27  GLUCOSE 119*  BUN 23  CREATININE 1.09  CALCIUM 9.3   Liver Function Tests:  Recent Labs  11/21/13 2125  AST 20  ALT 43*  ALKPHOS 129*  BILITOT 0.3  PROT 8.0  ALBUMIN 3.1*   CBC:  Recent Labs  11/21/13 2125  WBC 7.9  HGB 11.7*  HCT 37.8  MCV 81.5  PLT 255   Coagulation:  Recent Labs  11/21/13 2125  LABPROT 18.8*  INR 1.57*   Urine Drug Screen: Drugs of Abuse     Component Value Date/Time   LABOPIA NONE DETECTED 08/02/2011 2354   LABOPIA NEG 06/20/2008 2057   COCAINSCRNUR NONE DETECTED 08/02/2011 2354   COCAINSCRNUR NEG 06/20/2008 2057   LABBENZ NONE DETECTED 08/02/2011 2354   LABBENZ NEG 06/20/2008 2057   AMPHETMU NONE DETECTED 08/02/2011 2354   AMPHETMU NEG 06/20/2008 2057   THCU NONE DETECTED 08/02/2011 2354   LABBARB NONE DETECTED 08/02/2011 2354    Assessment & Plan by Problem:  BRBPR- Patient claims she had rectal bleeding at home. Stool noted to be brown  in color in ED, but FOBT found to be positive. Baseline Hb ~11 w/ MCV ~78, noted to be 11.7 on admission w/ MCV of 81.5. Patient denies any symptoms of dizziness, lightheadedness, or weakness. Vitals stable, normotensive, pulse 80-90's. Since patient is currently taking Xarelto, significant GI bleed must be ruled out. Patient has a h/o microcytic anemia, currently on iron supplementation Ferrous sulfate 325 mg BID, however this was not on  her discharge summary from recent hospitalization in April 2015. Most recent iron panel from 11/20/2009 shows iron of 41, TIBC of 281, ferritin of 65. Patient seen by Dr. Arlyce Dice (GI) as recently as 05/2010 for iron deficiency anemia and hemoccult positive stools at that time. Colonoscopy on 05/21/2010 showed mild diverticulosis, EGD was found to be normal. Capsule endoscopy also performed, significant for one gastric erosion and small colonic AVM's. Suspect hemorrhoidal, diverticular or AVM bleed at this time.  -Admit to med surg  -NPO for now  -EKG -Anemia panel pending  -Continue Xarelto at this time given stable Hb  -Repeat CBC in AM  -If significant decrease in Hb, consider GI consult vs outpatient referral if stable Hb.   CHF w/ AICD- Most likely 2/2 severe MR, now s/p MV repair. Also has AICD, placed in 2013. Currently stable, current weight 361 lbs, down from 363 lbs on 10/10/13 in [facility] clinic. Takes Metolazone 5 mg bid on Mondays and Thursdays, Torsemide 100 mg bid, Spironolactone 25 mg qd, Coreg 12.5 mg bid, and Imdur 90 mg qd at home. Most recent ECHO from 06/04/2013 shows EF of 50-55%, diastolic function not reported. ECHO from 04/03/2013 shows EF of 40% w/ grade 1 diastolic dysfunction.  -Continue home meds as listed above  Rheumatoid Arthritis- Patient w/ chronic joint pain and back pain. Shown to be RF positive (163) in 2009. Currently stable, follows w/ rheumatologist Dr. Nickola Major.  -Vicodin prn for pain  Chronic Hypokalemia: K 3.5 on admission. Likely 2/2 to diuretics. Takes KCl 20 mEq BID at home.  -Potassium chloride 40 mEq  Iron Deficiency Anemia: Most recent iron panel from 11/20/09 shows iron 41, TIBC 281, Ferritin 65 and Fe saturation 15. Today, Hgb 11.7, up from baseline. MCV 81.5.  -Iron panel pending  COPD: On albuterol 2 puffs Q6H prn shortness of breath. Stable.  HTN: BP on admission was 130/68. On carvedilol 12.5 mg BID, hydralazine 50 mg TID, torsemide 100 mg BID, metolazone 5  mg twice a week.  -Continue home medications.  Chronic Kidney Disease Stage III: Cr 1.09. GFR 66. Baseline GFR ~50, baseline Cr ~1.6. Stable. -Continue diuretics as above.  History of DVT: Admission from 05/2013 showed elevated D-dimer and LE dopplers which showed right femoral, right popliteal, and left common femoral veins to be noncompressible, suggestive of possible thrombosis versus extreme patient guarding. Study was difficult to interpret 2/2 significant patient guarding and body habitus. 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 On Xarelto 20 mg QAM at home. -Continue Xarelto  -Continue Neurontin for LE pain  Type II DM: Uncontrolled, Most recent HbA1c 13.0 from admission in 08/2013 for hyperglycemia w/ polydipsia, CBG on admission found to be 719 at that time. Currently on Levemir 50 units qhs.  -NPO  -ISS-S for now; CBG's q4h -Recheck hemoglobin A1c in the morning  OSA: On CPAP at home.  -Continue CPAP.  GERD: On omeprazole 40 mg daily at home. Stable. -Continue PPI  HLD- Most recent lipid panel from 09/30/2013 shows total cholesterol 180, triglycerides 109, HDL 35, LDL 123. On Lipitor  40 mg qhs at home.  -Continue statin  Schizoaffective Disorder/Depression/Anxiety/PTSC: On amitriptyline 150 mg QHS for sleep. Stable. -Continue Elavil.  Gout: Most recent gout flare in 09/2013 per chart review, likely 2/2 non-compliance w/ Uloric. Stable at this time. On febuxostat (Uloric) 80 mg daily at home. Pt is unclear why she is on prednisone, but believe it was started for gout flares -Continue Uloric. -Continue Prednisone  DVT/PE ppx: Xarelto  Dispo: Disposition is deferred at this time, awaiting improvement of current medical problems. Anticipated discharge in approximately 1-2 day(s).   The patient does have a current PCP Harold Barban, MD) and does not need an [facility] hospital follow-up appointment after discharge.  The patient does not have transportation limitations that hinder  transportation to clinic appointments.  Signed: Jill Alexanders, DO PGY-1 Internal Medicine Resident Pager # [phone] 11/22/2013 2:23 AM

## 2013-11-22 DIAGNOSIS — M069 Rheumatoid arthritis, unspecified: Secondary | ICD-10-CM

## 2013-11-22 DIAGNOSIS — I129 Hypertensive chronic kidney disease with stage 1 through stage 4 chronic kidney disease, or unspecified chronic kidney disease: Secondary | ICD-10-CM

## 2013-11-22 DIAGNOSIS — K625 Hemorrhage of anus and rectum: Secondary | ICD-10-CM | POA: Diagnosis not present

## 2013-11-22 DIAGNOSIS — Z86718 Personal history of other venous thrombosis and embolism: Secondary | ICD-10-CM

## 2013-11-22 DIAGNOSIS — I509 Heart failure, unspecified: Secondary | ICD-10-CM

## 2013-11-22 DIAGNOSIS — G4733 Obstructive sleep apnea (adult) (pediatric): Secondary | ICD-10-CM

## 2013-11-22 DIAGNOSIS — R197 Diarrhea, unspecified: Secondary | ICD-10-CM

## 2013-11-22 DIAGNOSIS — E1165 Type 2 diabetes mellitus with hyperglycemia: Secondary | ICD-10-CM

## 2013-11-22 DIAGNOSIS — M109 Gout, unspecified: Secondary | ICD-10-CM

## 2013-11-22 DIAGNOSIS — IMO0001 Reserved for inherently not codable concepts without codable children: Secondary | ICD-10-CM

## 2013-11-22 DIAGNOSIS — Z95810 Presence of automatic (implantable) cardiac defibrillator: Secondary | ICD-10-CM

## 2013-11-22 DIAGNOSIS — E876 Hypokalemia: Secondary | ICD-10-CM

## 2013-11-22 DIAGNOSIS — D509 Iron deficiency anemia, unspecified: Secondary | ICD-10-CM

## 2013-11-22 DIAGNOSIS — K219 Gastro-esophageal reflux disease without esophagitis: Secondary | ICD-10-CM

## 2013-11-22 DIAGNOSIS — J449 Chronic obstructive pulmonary disease, unspecified: Secondary | ICD-10-CM

## 2013-11-22 DIAGNOSIS — F259 Schizoaffective disorder, unspecified: Secondary | ICD-10-CM

## 2013-11-22 DIAGNOSIS — N183 Chronic kidney disease, stage 3 unspecified: Secondary | ICD-10-CM

## 2013-11-22 DIAGNOSIS — F341 Dysthymic disorder: Secondary | ICD-10-CM

## 2013-11-22 DIAGNOSIS — E785 Hyperlipidemia, unspecified: Secondary | ICD-10-CM

## 2013-11-22 LAB — FERRITIN: FERRITIN: 109 ng/mL (ref 10–291)

## 2013-11-22 LAB — GLUCOSE, CAPILLARY
Glucose-Capillary: 108 mg/dL — ABNORMAL HIGH (ref 70–99)
Glucose-Capillary: 115 mg/dL — ABNORMAL HIGH (ref 70–99)
Glucose-Capillary: 116 mg/dL — ABNORMAL HIGH (ref 70–99)
Glucose-Capillary: 167 mg/dL — ABNORMAL HIGH (ref 70–99)

## 2013-11-22 LAB — BASIC METABOLIC PANEL
Anion gap: 16 — ABNORMAL HIGH (ref 5–15)
BUN: 21 mg/dL (ref 6–23)
CALCIUM: 8.8 mg/dL (ref 8.4–10.5)
CO2: 27 meq/L (ref 19–32)
CREATININE: 1 mg/dL (ref 0.50–1.10)
Chloride: 102 mEq/L (ref 96–112)
GFR calc Af Amer: 74 mL/min — ABNORMAL LOW (ref >90)
GFR calc non Af Amer: 64 mL/min — ABNORMAL LOW (ref >90)
GLUCOSE: 116 mg/dL — AB (ref 70–99)
Potassium: 3.6 mEq/L — ABNORMAL LOW (ref 3.7–5.3)
Sodium: 145 mEq/L (ref 137–147)

## 2013-11-22 LAB — CBC
HCT: 34.4 % — ABNORMAL LOW (ref 36.0–46.0)
HEMOGLOBIN: 10.9 g/dL — AB (ref 12.0–15.0)
MCH: 26.1 pg (ref 26.0–34.0)
MCHC: 31.7 g/dL (ref 30.0–36.0)
MCV: 82.3 fL (ref 78.0–100.0)
Platelets: 273 10*3/uL (ref 150–400)
RBC: 4.18 MIL/uL (ref 3.87–5.11)
RDW: 18.3 % — ABNORMAL HIGH (ref 11.5–15.5)
WBC: 8.5 10*3/uL (ref 4.0–10.5)

## 2013-11-22 LAB — IRON AND TIBC
Iron: 25 ug/dL — ABNORMAL LOW (ref 42–135)
Saturation Ratios: 10 % — ABNORMAL LOW (ref 20–55)
TIBC: 260 ug/dL (ref 250–470)
UIBC: 235 ug/dL (ref 125–400)

## 2013-11-22 LAB — HEMOGLOBIN A1C
Hgb A1c MFr Bld: 7.6 % — ABNORMAL HIGH (ref <5.7)
MEAN PLASMA GLUCOSE: 171 mg/dL — AB (ref <117)

## 2013-11-22 LAB — MAGNESIUM: Magnesium: 1.4 mg/dL — ABNORMAL LOW (ref 1.5–2.5)

## 2013-11-22 MED ORDER — CARVEDILOL 6.25 MG PO TABS
18.7500 mg | ORAL_TABLET | Freq: Two times a day (BID) | ORAL | Status: DC
  Administered 2013-11-22 (×2): 18.75 mg via ORAL
  Filled 2013-11-22 (×3): qty 1

## 2013-11-22 MED ORDER — SPIRONOLACTONE 25 MG PO TABS
25.0000 mg | ORAL_TABLET | Freq: Every day | ORAL | Status: DC
  Administered 2013-11-22: 25 mg via ORAL
  Filled 2013-11-22: qty 1

## 2013-11-22 MED ORDER — HYDRALAZINE HCL 50 MG PO TABS
50.0000 mg | ORAL_TABLET | Freq: Three times a day (TID) | ORAL | Status: DC
  Administered 2013-11-22 (×2): 50 mg via ORAL
  Filled 2013-11-22 (×3): qty 1

## 2013-11-22 MED ORDER — POTASSIUM CHLORIDE CRYS ER 20 MEQ PO TBCR
40.0000 meq | EXTENDED_RELEASE_TABLET | Freq: Once | ORAL | Status: AC
  Administered 2013-11-22: 40 meq via ORAL
  Filled 2013-11-22: qty 2

## 2013-11-22 MED ORDER — ATORVASTATIN CALCIUM 40 MG PO TABS
40.0000 mg | ORAL_TABLET | Freq: Every day | ORAL | Status: DC
  Administered 2013-11-22: 40 mg via ORAL
  Filled 2013-11-22: qty 1

## 2013-11-22 MED ORDER — TORSEMIDE 100 MG PO TABS
100.0000 mg | ORAL_TABLET | Freq: Two times a day (BID) | ORAL | Status: DC
  Administered 2013-11-22 (×2): 100 mg via ORAL
  Filled 2013-11-22 (×3): qty 1

## 2013-11-22 MED ORDER — PREDNISONE 20 MG PO TABS
20.0000 mg | ORAL_TABLET | Freq: Every day | ORAL | Status: DC
  Administered 2013-11-22: 20 mg via ORAL
  Filled 2013-11-22 (×2): qty 1

## 2013-11-22 MED ORDER — AMITRIPTYLINE HCL 75 MG PO TABS
150.0000 mg | ORAL_TABLET | Freq: Every day | ORAL | Status: DC
  Administered 2013-11-22: 150 mg via ORAL
  Filled 2013-11-22 (×2): qty 2

## 2013-11-22 MED ORDER — ALBUTEROL SULFATE (2.5 MG/3ML) 0.083% IN NEBU
3.0000 mL | INHALATION_SOLUTION | Freq: Four times a day (QID) | RESPIRATORY_TRACT | Status: DC | PRN
  Administered 2013-11-22: 3 mL via RESPIRATORY_TRACT
  Filled 2013-11-22: qty 3

## 2013-11-22 MED ORDER — ISOSORBIDE MONONITRATE ER 60 MG PO TB24
90.0000 mg | ORAL_TABLET | Freq: Every day | ORAL | Status: DC
  Administered 2013-11-22: 90 mg via ORAL
  Filled 2013-11-22: qty 1

## 2013-11-22 MED ORDER — FERROUS SULFATE 325 (65 FE) MG PO TABS: 325.0000 mg | ORAL_TABLET | Freq: Three times a day (TID) | ORAL | Status: AC

## 2013-11-22 MED ORDER — GABAPENTIN 100 MG PO CAPS
100.0000 mg | ORAL_CAPSULE | Freq: Three times a day (TID) | ORAL | Status: DC
  Administered 2013-11-22 (×2): 100 mg via ORAL
  Filled 2013-11-22 (×3): qty 1

## 2013-11-22 MED ORDER — HYDROCODONE-ACETAMINOPHEN 10-325 MG PO TABS
1.0000 | ORAL_TABLET | Freq: Four times a day (QID) | ORAL | Status: DC | PRN
  Administered 2013-11-22 (×3): 1 via ORAL
  Filled 2013-11-22 (×3): qty 1

## 2013-11-22 MED ORDER — RIVAROXABAN 20 MG PO TABS
20.0000 mg | ORAL_TABLET | Freq: Every day | ORAL | Status: DC
  Administered 2013-11-22: 20 mg via ORAL
  Filled 2013-11-22: qty 1

## 2013-11-22 MED ORDER — PANTOPRAZOLE SODIUM 40 MG PO TBEC
40.0000 mg | DELAYED_RELEASE_TABLET | Freq: Every day | ORAL | Status: DC
  Administered 2013-11-22: 40 mg via ORAL
  Filled 2013-11-22: qty 1

## 2013-11-22 MED ORDER — FEBUXOSTAT 40 MG PO TABS
80.0000 mg | ORAL_TABLET | Freq: Every day | ORAL | Status: DC
  Administered 2013-11-22: 80 mg via ORAL
  Filled 2013-11-22: qty 2

## 2013-11-22 MED ORDER — METOLAZONE 5 MG PO TABS: 5.0000 mg | ORAL_TABLET | ORAL | Status: DC

## 2013-11-22 MED ORDER — INSULIN ASPART 100 UNIT/ML ~~LOC~~ SOLN
0.0000 [IU] | SUBCUTANEOUS | Status: DC
  Administered 2013-11-22: 2 [IU] via SUBCUTANEOUS

## 2013-11-22 MED ORDER — NYSTATIN 100000 UNIT/GM EX POWD
Freq: Two times a day (BID) | CUTANEOUS | Status: DC
  Administered 2013-11-22: 11:00:00 via TOPICAL
  Filled 2013-11-22 (×2): qty 15

## 2013-11-22 NOTE — Progress Notes (Signed)
UR Completed Kemi Gell Graves-Bigelow, RN,BSN [phone]  

## 2013-11-22 NOTE — Progress Notes (Signed)
New Admission Note:  Arrival Method: via stretcher with care link Mental Orientation: Alert and oriented x4 Telemetry: Assessment: Completed Pain: 6/10, MD notified Tubes: N/A Skin: Rash in abdominal folds Safety Measures: Safety Fall Prevention Plan was given, discussed and signed. Admission: initiated [address] [place] Orientation: Patient has been orientated to the room, unit and the staff. Family: None at bedside Orders have been reviewed and implemented. Will continue to monitor the patient. Call light has been placed within reach and bed alarm has been activated.   Tempie Donning BSN, RN  Phone Number: [phone] [facility] [address] [place] [facility]

## 2013-11-22 NOTE — Discharge Summary (Signed)
Name: [name] MRN: [id number] DOB: Jan 20, 1961 53 y.o. PCP: Harold Barban, MD  Date of Admission: 11/21/2013  8:11 PM Date of Discharge: 11/22/2013 Attending Physician: Rocco Serene, MD  Discharge Diagnosis: Principal Problem:   BRBPR (bright red blood per rectum) Active Problems:   ANEMIA, IRON DEFICIENCY   Hypokalemia   Severe obesity (BMI >= 40)   Chronic diastolic heart failure   CKD (chronic kidney disease) stage 3, GFR 30-59 ml/min   DM (diabetes mellitus), type 2, uncontrolled, with renal complications  Discharge Medications:   Medication List         albuterol 108 (90 BASE) MCG/ACT inhaler  Commonly known as:  PROAIR HFA  Inhale 2 puffs into the lungs every 6 (six) hours as needed for wheezing or shortness of breath.     amitriptyline 150 MG tablet  Commonly known as:  ELAVIL  Take 1 tablet (150 mg total) by mouth at bedtime.     atorvastatin 40 MG tablet  Commonly known as:  LIPITOR  Take 1 tablet (40 mg total) by mouth daily.     carvedilol 12.5 MG tablet  Commonly known as:  COREG  Take 1.5 tablets (18.75 mg total) by mouth 2 (two) times daily with a meal.     diclofenac sodium 1 % Gel  Commonly known as:  VOLTAREN  Apply 2 g topically 4 (four) times daily.     febuxostat 40 MG tablet  Commonly known as:  ULORIC  Take 80 mg by mouth daily.     ferrous sulfate 325 (65 FE) MG tablet  Take 1 tablet (325 mg total) by mouth 3 (three) times daily with meals.     gabapentin 100 MG capsule  Commonly known as:  NEURONTIN  Take 1 capsule (100 mg total) by mouth 3 (three) times daily.     glucose blood test strip  Commonly known as:  FREESTYLE LITE  Use as instructed     hydrALAZINE 50 MG tablet  Commonly known as:  APRESOLINE  Take 1 tablet (50 mg total) by mouth 3 (three) times daily.     HYDROcodone-acetaminophen 10-325 MG per tablet  Commonly known as:  NORCO  Take 1 tablet by mouth every 6 (six) hours as needed for severe pain.      Insulin Detemir 100 UNIT/ML Pen  Commonly known as:  LEVEMIR  Inject 50 Units into the skin daily at 10 pm.     isosorbide mononitrate 60 MG 24 hr tablet  Commonly known as:  IMDUR  Take 90 mg by mouth daily.     metolazone 5 MG tablet  Commonly known as:  ZAROXOLYN  Take 1 tablet (5 mg total) by mouth 2 (two) times a week.     nystatin 100000 UNIT/GM Powd  Apply 1 Bottle topically 2 (two) times daily.     omeprazole 40 MG capsule  Commonly known as:  PRILOSEC  Take 1 capsule (40 mg total) by mouth daily.     potassium chloride SA 20 MEQ tablet  Commonly known as:  K-DUR,KLOR-CON  Take 20 mEq by mouth 2 (two) times daily.     predniSONE 20 MG tablet  Commonly known as:  DELTASONE  Take 1 tablet (20 mg total) by mouth daily.     spironolactone 25 MG tablet  Commonly known as:  ALDACTONE  Take 25 mg by mouth daily.     torsemide 100 MG tablet  Commonly known as:  DEMADEX  Take 1 tablet (  100 mg total) by mouth 2 (two) times daily.     XARELTO 20 MG Tabs tablet  Generic drug:  rivaroxaban  Take 20 mg by mouth every morning.        Disposition and follow-up:   [name] was discharged from [facility] in Stable condition.  At the hospital follow up visit please address:  1.  Please reassess hemoglobin and magnesium levels. Hb was 11.7-->10.9 during hospitalization and she needs early follow-up to ensure that it hasn't continued to drop. Also, her magnesium was slightly low at 1.4. She was prescribed mag ox 400 mg BID for 2 days; pharmacy counseled that this should not increase her diarrhea.   2.  Labs / imaging needed at time of follow-up: CBC  3.  Pending labs/ test needing follow-up: none  Follow-up Appointments:     Follow-up Information   Follow up with Dr. Allena Katz On 11/26/2013. (8:30 AM)        Consultations:  none  Procedures Performed:  None  Admission HPI: [name] is a 54 y.o. female w/ PMHx of HTN, DM  type II, COPD, CHF (EF 50-55%, grade 1 diastolic dysfunction), OSA, gout, RA (RF positive), h/o DVT (seen on LE doppler in 05/2013) on Xarelto, CKD stage 2-3, GERD, PTSD, anxiety, schizophrenia, and depression admitted from [facility] ED for BRBPR. Pt presents to the ED with complaint of rectal bleeding. Pt states she first saw bright red blood on her toilet paper after having a bowel movement at 3 am. She proceeded to see it twice more over the course of the day with the most amount being about a couple of ounces. She states her bowel movements are painful. She denies seeing any clots. She also admits to diarrhea and abdominal cramping for one day. She denies any fever, chills, nausea, vomiting, weakness or dizziness.   Hospital Course by problem list: Principal Problem:   BRBPR (bright red blood per rectum) Active Problems:   ANEMIA, IRON DEFICIENCY   Hypokalemia   Severe obesity (BMI >= 40)   Chronic diastolic heart failure   CKD (chronic kidney disease) stage 3, GFR 30-59 ml/min   DM (diabetes mellitus), type 2, uncontrolled, with renal complications  [name] is a 53 yo woman with HTN, DMII, COPD, CHF (grade 1 diastolic dysfunction), OSA, gout, RA, CKD (stage II-III), GERD, PTSD, anxiety, schizophrenia, depression and a history of DVT (on Xarelto) who was hospitalized for evaluation of rectal bleeding in the context of painful BMs and some recent diarrhea/cramping.  BRBPR: Though the patient noticed blood after BMs and is on Xarelto (bleeding risk factor), her positive FOBT is not meaningful, as the patient is on iron supplementation. Her baseline Hb ~11 w/ MCV ~78. During this hospital stay, her Hb was 11.7 (MCV of 81.5) on admission and most recently 10.9. This slight drop may be due to her dehydration on admission (due to diarrhea over the last day) and rehydration over her stay. Patient is asymptomatic and her vitals are stable. Her orthostatics are negative.  Patient has been extensively worked  up by Dr. Arlyce Dice (GI) for iron deficiency anemia and hemoccult positive stools at that time. Colonoscopy on 05/21/2010 showed mild diverticulosis, EGD was found to be normal. Capsule endoscopy also performed, significant for one gastric erosion and small colonic AVM's. Suspect hemorrhoidal or AVM bleed, along with a possible colitis or gastroenteritis. Less concerning for significant blood loss, especially in the context of her stable vitals and negative  orthostatics. - Anemia panel back, shows iron of 25, which is lower than her baseline, but patient has chronically been low  - Increased iron to 325mg  TID (patient has not been receiving the recommended dose for iron supplementation absorption)  - Scheduled for follow up with labs early next week (to assess for any drop in hb, which would be more concerning for a GI bleed)  Diarrhea: The patient may have colitis or gastroenteritis. Diarrhea and associated cramping have only been present for 1 day.  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 Also has AICD, placed in 2013. Currently stable, current weight 361 lbs, down from 363 lbs on 10[facility]10/13 [facility]. Takes Metolazone 5 mg bid on Mondays and Thursdays, Torsemide 100 mg bid, Spironolactone 25 mg qd, Coreg 12.5 mg bid, and Imdur 90 mg qd at home. Most recent ECHO from 06/04/2013 shows EF of 50-55%, diastolic function not reported. ECHO from 04/03/2013 shows EF of 40% w/ grade 1 diastolic dysfunction.   Rheumatoid Arthritis- Patient w/ chronic joint pain and back pain. Shown to be RF positive (163) in 2009. Currently stable, follows w/ rheumatologist Dr. Nickola MajorHawkes.   Chronic Hypokalemia: K 3.5 on admission. Likely 2/2 to diuretics. Takes KCl 20 mEq BID at home.  - Received home dose Potassium chloride 40 mEq  - Magnesium also slightly low at 1.4; patient given mag ox 400 BID for 2 days  Iron Deficiency Anemia: 11/20/09 shows iron 41, TIBC 281, Ferritin 65 and Fe saturation 15. Today, Iron 25,  TIBC 260, Ferritin 109, Fe saturation 10.   COPD: On albuterol 2 puffs Q6H prn shortness of breath. Stable.   HTN: BP on admission was 130/68. On carvedilol 12.5 mg BID, hydralazine 50 mg TID, torsemide 100 mg BID, metolazone 5 mg twice a week.  - Continued home medications.   Chronic Kidney Disease Stage III: Cr 1.09. GFR 66. Baseline GFR ~50, baseline Cr ~1.6. Stable.  - Continued diuretics as above.  History of DVT: Admission from 05/2013 showed elevated D-dimer and LE dopplers which showed right femoral, right popliteal, and left common femoral veins to be noncompressible, suggestive of possible thrombosis versus extreme patient guarding. Study was difficult to interpret 2/2 significant patient guarding and body habitus. 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 On Xarelto 20 mg QAM at home. - Continued Xarelto  - Continued Neurontin for LE pain   Type II DM: Uncontrolled, Most recent HbA1c 13.0 from admission in 08/2013 for hyperglycemia w/ polydipsia, CBG on admission found to be 719 at that time. Currently on Levemir 50 units qhs.   OSA: On CPAP at home.  - Continued CPAP.   GERD: On omeprazole 40 mg daily at home. Stable.  - Continued PPI   HLD- Most recent lipid panel from 09/30/2013 shows total cholesterol 180, triglycerides 109, HDL 35, LDL 123. On Lipitor 40 mg qhs at home.  - Continued statin   Schizoaffective Disorder/Depression/Anxiety/PTSC: On amitriptyline 150 mg QHS for sleep. Stable.  - Continued Elavil.   Gout: Most recent gout flare in 09/2013 per chart review, likely 2/2 non-compliance w/ Uloric. Stable at this time. On febuxostat (Uloric) 80 mg daily at home. Pt is unclear why she is on prednisone, but believe it was started for gout flares  - Continued Uloric.  - Continued Prednisone   Discharge Vitals:   BP 122/81  Pulse 93  Temp(Src) 98 F (36.7 C) (Oral)  Resp 19  Ht 5\' 6"  (1.676 m)  Wt 356 lb 9.6 oz (161.753 kg)  BMI 57.58 kg/m2  SpO2 98%  LMP  05/05/2012  Discharge Labs:  Results for orders placed during the hospital encounter of 11/21/13 (from the past 24 hour(s))  POC OCCULT BLOOD, ED     Status: Abnormal   Collection Time    11/21/13  9:20 PM      Result Value Ref Range   Fecal Occult Bld POSITIVE (*) NEGATIVE  CBC     Status: Abnormal   Collection Time    11/21/13  9:25 PM      Result Value Ref Range   WBC 7.9  4.0 - 10.5 K/uL   RBC 4.64  3.87 - 5.11 MIL/uL   Hemoglobin 11.7 (*) 12.0 - 15.0 g/dL   HCT 16.1  09.6 - 04.5 %   MCV 81.5  78.0 - 100.0 fL   MCH 25.2 (*) 26.0 - 34.0 pg   MCHC 31.0  30.0 - 36.0 g/dL   RDW 40.9 (*) 81.1 - 91.4 %   Platelets 255  150 - 400 K/uL  COMPREHENSIVE METABOLIC PANEL     Status: Abnormal   Collection Time    11/21/13  9:25 PM      Result Value Ref Range   Sodium 141  137 - 147 mEq/L   Potassium 3.5 (*) 3.7 - 5.3 mEq/L   Chloride 100  96 - 112 mEq/L   CO2 27  19 - 32 mEq/L   Glucose, Bld 119 (*) 70 - 99 mg/dL   BUN 23  6 - 23 mg/dL   Creatinine, Ser 7.82  0.50 - 1.10 mg/dL   Calcium 9.3  8.4 - 95.6 mg/dL   Total Protein 8.0  6.0 - 8.3 g/dL   Albumin 3.1 (*) 3.5 - 5.2 g/dL   AST 20  0 - 37 U/L   ALT 43 (*) 0 - 35 U/L   Alkaline Phosphatase 129 (*) 39 - 117 U/L   Total Bilirubin 0.3  0.3 - 1.2 mg/dL   GFR calc non Af Amer 57 (*) >90 mL/min   GFR calc Af Amer 66 (*) >90 mL/min   Anion gap 14  5 - 15  PROTIME-INR     Status: Abnormal   Collection Time    11/21/13  9:25 PM      Result Value Ref Range   Prothrombin Time 18.8 (*) 11.6 - 15.2 seconds   INR 1.57 (*) 0.00 - 1.49  APTT     Status: Abnormal   Collection Time    11/21/13  9:25 PM      Result Value Ref Range   aPTT 39 (*) 24 - 37 seconds  I-STAT CG4 LACTIC ACID, ED     Status: None   Collection Time    11/21/13 10:00 PM      Result Value Ref Range   Lactic Acid, Venous 1.96  0.5 - 2.2 mmol/L  GLUCOSE, CAPILLARY     Status: Abnormal   Collection Time    11/22/13  1:59 AM      Result Value Ref Range    Glucose-Capillary 108 (*) 70 - 99 mg/dL  CBC     Status: Abnormal   Collection Time    11/22/13  4:07 AM      Result Value Ref Range   WBC 8.5  4.0 - 10.5 K/uL   RBC 4.18  3.87 - 5.11 MIL/uL   Hemoglobin 10.9 (*) 12.0 - 15.0 g/dL   HCT 21.3 (*)  36.0 - 46.0 %   MCV 82.3  78.0 - 100.0 fL   MCH 26.1  26.0 - 34.0 pg   MCHC 31.7  30.0 - 36.0 g/dL   RDW 81.0 (*) 17.5 - 10.2 %   Platelets 273  150 - 400 K/uL  BASIC METABOLIC PANEL     Status: Abnormal   Collection Time    11/22/13  4:07 AM      Result Value Ref Range   Sodium 145  137 - 147 mEq/L   Potassium 3.6 (*) 3.7 - 5.3 mEq/L   Chloride 102  96 - 112 mEq/L   CO2 27  19 - 32 mEq/L   Glucose, Bld 116 (*) 70 - 99 mg/dL   BUN 21  6 - 23 mg/dL   Creatinine, Ser 5.85  0.50 - 1.10 mg/dL   Calcium 8.8  8.4 - 27.7 mg/dL   GFR calc non Af Amer 64 (*) >90 mL/min   GFR calc Af Amer 74 (*) >90 mL/min   Anion gap 16 (*) 5 - 15  IRON AND TIBC     Status: Abnormal   Collection Time    11/22/13  4:07 AM      Result Value Ref Range   Iron 25 (*) 42 - 135 ug/dL   TIBC 824  235 - 361 ug/dL   Saturation Ratios 10 (*) 20 - 55 %   UIBC 235  125 - 400 ug/dL  FERRITIN     Status: None   Collection Time    11/22/13  4:07 AM      Result Value Ref Range   Ferritin 109  10 - 291 ng/mL  HEMOGLOBIN A1C     Status: Abnormal   Collection Time    11/22/13  4:07 AM      Result Value Ref Range   Hemoglobin A1C 7.6 (*) <5.7 %   Mean Plasma Glucose 171 (*) <117 mg/dL  MAGNESIUM     Status: Abnormal   Collection Time    11/22/13  4:07 AM      Result Value Ref Range   Magnesium 1.4 (*) 1.5 - 2.5 mg/dL  GLUCOSE, CAPILLARY     Status: Abnormal   Collection Time    11/22/13  4:08 AM      Result Value Ref Range   Glucose-Capillary 115 (*) 70 - 99 mg/dL  GLUCOSE, CAPILLARY     Status: Abnormal   Collection Time    11/22/13  7:42 AM      Result Value Ref Range   Glucose-Capillary 116 (*) 70 - 99 mg/dL  GLUCOSE, CAPILLARY     Status: Abnormal    Collection Time    11/22/13 11:12 AM      Result Value Ref Range   Glucose-Capillary 167 (*) 70 - 99 mg/dL    Signed: Dionne Ano, MD 11/22/2013, 2:35 PM    Services Ordered on Discharge: none Equipment Ordered on Discharge: none

## 2013-11-22 NOTE — Discharge Instructions (Addendum)
You have iron deficiency (low level of iron) in your blood. You have been taking iron supplements 2 times a day. However, you need to increase the iron to 3 times a day.  Please call your primary doctor and return to the hospital if you experience light-headedness, dizziness or weakness or if you see a large amount of blood in the toilet after bowel movements.   Information on my medicine - XARELTO (rivaroxaban)  This medication education was reviewed with me or my healthcare representative as part of my discharge preparation.  The pharmacist that spoke with me during my hospital stay was:  Mickey Farber, [facility]  WHY WAS XARELTO PRESCRIBED FOR YOU? Xarelto was prescribed to treat blood clots that may have been found in the veins of your legs (deep vein thrombosis) or in your lungs (pulmonary embolism) and to reduce the risk of them occurring again.  What do you need to know about Xarelto? The dose is a 20 mg tablet taken ONCE A DAY with your evening meal.  DO NOT stop taking Xarelto without talking to the health care provider who prescribed the medication.  Refill your prescription for 20 mg tablets before you run out.  After discharge, you should have regular check-up appointments with your healthcare provider that is prescribing your Xarelto.  In the future your dose may need to be changed if your kidney function changes by a significant amount.  What do you do if you miss a dose? If you are taking Xarelto ONCE DAILY and you miss a dose, take it as soon as you remember on the same day then continue your regularly scheduled once daily regimen the next day. Do not take two doses of Xarelto at the same time.   Important Safety Information Xarelto is a blood thinner medicine that can cause bleeding. You should call your healthcare provider right away if you experience any of the following:   Bleeding from an injury or your nose that does not stop.   Unusual colored urine (red or dark  brown) or unusual colored stools (red or black).   Unusual bruising for unknown reasons.   A serious fall or if you hit your head (even if there is no bleeding).  Some medicines may interact with Xarelto and might increase your risk of bleeding while on Xarelto. To help avoid this, consult your healthcare provider or pharmacist prior to using any new prescription or non-prescription medications, including herbals, vitamins, non-steroidal anti-inflammatory drugs (NSAIDs) and supplements.  This website has more information on Xarelto: VisitDestination.com.br.

## 2013-11-22 NOTE — Progress Notes (Signed)
Patient Discharge:  Disposition: Home with daughters  Education: Educated pt on medications, follow up appointment, diagnosis.   LM:[id number]  AC  Follow-up appointments: on discharge instructions  Prescriptions: Sent to pt pharmacy  Transportation: Family picked up  Belongings: cell phone,clothing, home medications

## 2013-11-22 NOTE — Progress Notes (Signed)
Subjective: Patient feels better this morning. She is experiencing occasional, shooting cramps, primarily on the right side of her abdomen.  She states that she has had no BMs while in her room. She denies dizziness, chills, nausea.  Objective: Vital signs in last 24 hours: Filed Vitals:   11/21/13 2012 11/21/13 2348 11/22/13 0140 11/22/13 0600  BP: 130/68 113/85 104/58 107/71  Pulse: 88 99 95 104  Temp: 97.9 F (36.6 C) 98.6 F (37 C) 97.8 F (36.6 C) 98.1 F (36.7 C)  TempSrc: Oral Oral Oral Oral  Resp: 18 20 20 18   Height:   5\' 6"  (1.676 m)   Weight:   356 lb 9.6 oz (161.753 kg)   SpO2: 98% 99% 100% 100%   General: Patient is a well-developed and well-nourished female, resting in bed watching TV  Head: Normocephalic and atraumatic.  Eyes: PERRL, EOMI, conjunctivae normal, no scleral icterus.  Cardiovascular: RRR, tachy on exam, S1 normal, S2 normal, no murmurs, gallops, or rubs.  Pulmonary/Chest: CTA bilaterally, no wheezes, rales, or rhonchi.  Abdominal: +BS, soft, obese, non-tender, non-distended, no masses or guarding present.  Musculoskeletal: No joint deformities, erythema, or stiffness, ROM full and nontender.  Extremities: Trace pitting edema in lower extremities bilaterally, pulses symmetric and intact bilaterally. No cyanosis or clubbing. Neurological: A&O x3 Skin: Warm, dry and intact. No rashes or erythema.   Lab Results: Na 145 K 3.6  Cl 102 CO2 27 BUN 21 Cr 1.00 AG 16 Ca 8.8 Mg 1.4  Iron 25 UIBC 235 TIBC 260 Ferritin 109  WBC 8.5 Hg 10.9<--11.7 Hct 34.4  Plts: 273 MCV 82.3  Orthostatics: Lying:   BP: 109/70  P: 92 Sitting:   BP: 113/75  P: 93 Standing:  BP: 122/81 P; 93  Medications: I have reviewed the patient's current medications. Scheduled Meds: . amitriptyline  150 mg Oral QHS  . atorvastatin  40 mg Oral Daily  . carvedilol  18.75 mg Oral BID WC  . febuxostat  80 mg Oral Daily  . gabapentin  100 mg Oral TID  . hydrALAZINE  50  mg Oral TID  . insulin aspart  0-9 Units Subcutaneous 6 times per day  . isosorbide mononitrate  90 mg Oral Daily  . [START ON 11/25/2013] metolazone  5 mg Oral Once per day on Mon Thu  . nystatin   Topical BID  . pantoprazole  40 mg Oral Daily  . potassium chloride  40 mEq Oral Once  . predniSONE  20 mg Oral Q breakfast  . rivaroxaban  20 mg Oral Q supper  . spironolactone  25 mg Oral Daily  . torsemide  100 mg Oral BID   PRN Meds:.albuterol, HYDROcodone-acetaminophen  Assessment/Plan: Principal Problem:   BRBPR (bright red blood per rectum) Active Problems:   ANEMIA, IRON DEFICIENCY   Hypokalemia   Severe obesity (BMI >= 40)   Chronic diastolic heart failure   CKD (chronic kidney disease) stage 3, GFR 30-59 ml/min   DM (diabetes mellitus), type 2, uncontrolled, with renal complications  Ms. Melby is a 53 yo woman with HTN, DMII, COPD, CHF (grade 1 diastolic dysfunction), OSA, gout, RA, CKD (stage II-III), GERD, PTSD, anxiety, schizophrenia, depression and a history of DVT (on Xarelto) who was hospitalized for evaluation of rectal bleeding in the context of painful BMs.   BRBPR: The positive FOBT is not meaningful, as the patient is on iron supplementation, but per the patient report, there is blood on her toilet paper recently. Baseline Hb ~11  w/ MCV ~78. During this hospital stay, her Hb was 11.7 (MCV of 81.5) on admission and most recently 10.9. Patient is asymptomatic and her vitals are stable. Her orthostatics arenegative.  Patient has been extensively worked up by Dr. Arlyce Dice (GI) for iron deficiency anemia and hemoccult positive stools at that time. Colonoscopy on 05/21/2010 showed mild diverticulosis, EGD was found to be normal. Capsule endoscopy also performed, significant for one gastric erosion and small colonic AVM's. Suspect hemorrhoidal or AVM bleed, especially in the context of her stable vitals and negative orthostatics, but must consider internal bleeding given the  patient's Xarelto. - Will schedule follow up with CBC for early next week - Anemia panel back, shows iron of 25, which is lower than her baseline, but patient has chronically been low  - Increase iron to 325mg  TID (patient has not been receiving the recommended dose for iron supplementation absorption)  CHF w/ AICD: Most likely 2/2 severe MR, now s/p MV repair. Also has AICD, placed in 2013. Currently stable, current weight 361 lbs, down from 363 lbs on 10/10/13 in [facility] clinic. Takes Metolazone 5 mg bid on Mondays and Thursdays, Torsemide 100 mg bid, Spironolactone 25 mg qd, Coreg 12.5 mg bid, and Imdur 90 mg qd at home. Most recent ECHO from 06/04/2013 shows EF of 50-55%, diastolic function not reported. ECHO from 04/03/2013 shows EF of 40% w/ grade 1 diastolic dysfunction.  -Continue home meds as listed above   Rheumatoid Arthritis- Patient w/ chronic joint pain and back pain. Shown to be RF positive (163) in 2009. Currently stable, follows w/ rheumatologist Dr. Nickola Major.  -Vicodin prn for pain   Chronic Hypokalemia: K 3.5 on admission. Likely 2/2 to diuretics. Takes KCl 20 mEq BID at home.  -Potassium chloride 40 mEq   Iron Deficiency Anemia: 11/20/09 shows iron 41, TIBC 281, Ferritin 65 and Fe saturation 15. Today, Iron 25, TIBC 260, Ferritin 109, Fe saturation 10.   COPD: On albuterol 2 puffs Q6H prn shortness of breath. Stable.   HTN: BP on admission was 130/68. On carvedilol 12.5 mg BID, hydralazine 50 mg TID, torsemide 100 mg BID, metolazone 5 mg twice a week.  -Continue home medications.   Chronic Kidney Disease Stage III: Cr 1.09. GFR 66. Baseline GFR ~50, baseline Cr ~1.6. Stable.  -Continue diuretics as above.   History of DVT: Admission from 05/2013 showed elevated D-dimer and LE dopplers which showed right femoral, right popliteal, and left common femoral veins to be noncompressible, suggestive of possible thrombosis versus extreme patient guarding. Study was difficult to interpret 2/2  significant patient guarding and body habitus. 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 On Xarelto 20 mg QAM at home. -Continue Xarelto  -Continue Neurontin for LE pain   Type II DM: Uncontrolled, Most recent HbA1c 13.0 from admission in 08/2013 for hyperglycemia w/ polydipsia, CBG on admission found to be 719 at that time. Currently on Levemir 50 units qhs.  -NPO  -ISS-S for now; CBG's q4h  -Recheck hemoglobin A1c in the morning   OSA: On CPAP at home.  -Continue CPAP.   GERD: On omeprazole 40 mg daily at home. Stable.  -Continue PPI   HLD- Most recent lipid panel from 09/30/2013 shows total cholesterol 180, triglycerides 109, HDL 35, LDL 123. On Lipitor 40 mg qhs at home.  -Continue statin   Schizoaffective Disorder/Depression/Anxiety/PTSC: On amitriptyline 150 mg QHS for sleep. Stable.  -Continue Elavil.   Gout: Most recent gout flare in 09/2013 per chart review, likely 2/2  non-compliance w/ Uloric. Stable at this time. On febuxostat (Uloric) 80 mg daily at home. Pt is unclear why she is on prednisone, but believe it was started for gout flares  -Continue Uloric.  -Continue Prednisone   DVT/PE ppx: Xarelto   Dispo: Disposition is deferred at this time, awaiting improvement of current medical problems.  Anticipated discharge in approximately 0 day(s).   The patient does have a current PCP Harold Barban, MD) and does need an [facility] hospital follow-up appointment after discharge.  The patient does not know have transportation limitations that hinder transportation to clinic appointments.  .Services Needed at time of discharge: Y = Yes, Blank = No PT:   OT:   RN:   Equipment:   Other:     LOS: 1 day   Dionne Ano, MD 11/22/2013, 7:46 AM

## 2013-11-22 NOTE — Progress Notes (Signed)
Internal Medicine Attending Admission Note Date: 11/22/2013  Patient name: [name] Medical record number: [id number] Date of birth: 1960/05/24 Age: 53 y.o. Gender: female  I saw and evaluated the patient. I reviewed the resident's note and I agree with the resident's findings and plan as documented in the resident's note.  Chief Complaint(s): Blood on the toilet paper  History - key components related to admission:  [name] is a 53 year old woman with a history of diabetes, diverticulosis, colonic AVM, and DVT on Xarelto who presents with blood on the toilet paper x1 day. Recently, she has noted painful stools. Over the last day she has had nonbloody diarrhea and right lower quadrant pain which is described as cramping and lasting for seconds at a time. She notes no precipitating factors for the pain or anything that relieves it. She specifically denies any fevers, shakes, chills, nausea, vomiting, or dizziness. She has a history of iron deficiency anemia and in 2012 underwent an extensive evaluation that included an EGD, capsule endoscopy, and colonoscopy. This was remarkable only for a colonic AVM and diverticulosis. When she was seen on rounds this morning she was feeling better and ready to return home. She has not had a bowel movement since being in the hospital, yet her FOBT was positive. She is without other complaints at this time.  Physical Exam - key components related to admission:  Filed Vitals:   11/22/13 0953 11/22/13 1332 11/22/13 1334 11/22/13 1335  BP: 132/87 109/70 113/75 122/81  Pulse: 95 92 93 93  Temp: 98.3 F (36.8 C) 98 F (36.7 C)    TempSrc: Oral Oral    Resp: 20 20 20 19   Height:      Weight:      SpO2: 98% 96% 97% 98%   General: Well-developed, well-nourished, obese woman lying comfortably in bed in no acute distress. Abdomen: Soft, mild right lower corner and pain that is not consistent, active bowel sounds, no rebound.  Lab results:  Basic  Metabolic Panel:  Recent Labs  2125 11/22/13 0407  NA 141 145  K 3.5* 3.6*  CL 100 102  CO2 27 27  GLUCOSE 119* 116*  BUN 23 21  CREATININE 1.09 1.00  CALCIUM 9.3 8.8  MG  --  1.4*   Liver Function Tests:  Recent Labs  11/21/13 2125  AST 20  ALT 43*  ALKPHOS 129*  BILITOT 0.3  PROT 8.0  ALBUMIN 3.1*   CBC:  Recent Labs  11/21/13 2125 11/22/13 0407  WBC 7.9 8.5  HGB 11.7* 10.9*  HCT 37.8 34.4*  MCV 81.5 82.3  PLT 255 273   CBG:  Recent Labs  11/22/13 0159 11/22/13 0408 11/22/13 0742 11/22/13 1112  GLUCAP 108* 115* 116* 167*   Hemoglobin A1C:  Recent Labs  11/22/13 0407  HGBA1C 7.6*   Anemia Panel:  Recent Labs  11/22/13 0407  FERRITIN 109  TIBC 260  IRON 25*   Coagulation:  Recent Labs  11/21/13 2125  INR 1.57*   Assessment & Plan by Problem:  [name] is a 53 year old woman with a history of diabetes, diverticulosis, colonic AVM, and DVT on anticoagulation who presents with blood on the toilet paper. She is not dizzy, has had no further bleeding, and is not orthostatic when her vitals were checked. Her hemoglobin has dropped minimally from her baseline. The likely cause of her symptoms include hemorrhoidal bleed versus a colonic AVM bleed. I doubt this is a significant diverticular bleed given her  lack of significant blood loss or orthostasis. She feels improved and is stable for discharge home.  1) Blood on the toilet paper: She had an extensive workup in 2012 and this does not need to be repeated at this time. She will be seen in the internal medicine center next week with a repeat CBC to assure stability of her blood counts. She knows to return to the hospital should she have further bright red blood per rectum.  2) Disposition: She is ready for discharge home today with followup in the internal medicine center next week.

## 2013-11-23 MED ORDER — MAGNESIUM OXIDE 400 MG PO CAPS: 400.0000 mg | ORAL_CAPSULE | Freq: Two times a day (BID) | ORAL | Status: AC

## 2013-11-23 NOTE — Discharge Summary (Signed)
Agree with Dr. Vivien Rossetti discharge summary with the following modifications:  1) If patient is on prednisone for gout flares, this can be weaned in the outpatient clinic as long as she remains compliant with her uloric and the uric acid is consistently at or below 6.0.  2) Oral iron therapy will not result in a positive FOBT.  I believe the positive result represents blood, likely from a colonic AVM, hemorrhoid, or secondary to diverticulosis.  She was hemodynamically stable and non-orthostatic throughout the hospitalization with no further BRBPR.

## 2013-11-25 ENCOUNTER — Encounter (HOSPITAL_COMMUNITY): Payer: Self-pay

## 2013-11-25 ENCOUNTER — Ambulatory Visit (HOSPITAL_COMMUNITY)
Admission: RE | Admit: 2013-11-25 | Discharge: 2013-11-25 | Disposition: A | Discharge status: Home / Self Care | Payer: Medicare Other | Source: Ambulatory Visit | Attending: Cardiology | Admitting: Cardiology

## 2013-11-25 ENCOUNTER — Telehealth: Payer: Self-pay | Admitting: *Deleted

## 2013-11-25 VITALS — BP 114/71 | HR 99 | Resp 18 | Wt 357.5 lb

## 2013-11-25 DIAGNOSIS — D509 Iron deficiency anemia, unspecified: Secondary | ICD-10-CM | POA: Diagnosis not present

## 2013-11-25 DIAGNOSIS — Z794 Long term (current) use of insulin: Secondary | ICD-10-CM | POA: Insufficient documentation

## 2013-11-25 DIAGNOSIS — Z885 Allergy status to narcotic agent status: Secondary | ICD-10-CM | POA: Diagnosis not present

## 2013-11-25 DIAGNOSIS — F17200 Nicotine dependence, unspecified, uncomplicated: Secondary | ICD-10-CM | POA: Insufficient documentation

## 2013-11-25 DIAGNOSIS — I509 Heart failure, unspecified: Secondary | ICD-10-CM | POA: Insufficient documentation

## 2013-11-25 DIAGNOSIS — E118 Type 2 diabetes mellitus with unspecified complications: Secondary | ICD-10-CM

## 2013-11-25 DIAGNOSIS — G4733 Obstructive sleep apnea (adult) (pediatric): Secondary | ICD-10-CM | POA: Insufficient documentation

## 2013-11-25 DIAGNOSIS — IMO0002 Reserved for concepts with insufficient information to code with codable children: Secondary | ICD-10-CM | POA: Insufficient documentation

## 2013-11-25 DIAGNOSIS — I129 Hypertensive chronic kidney disease with stage 1 through stage 4 chronic kidney disease, or unspecified chronic kidney disease: Secondary | ICD-10-CM | POA: Diagnosis not present

## 2013-11-25 DIAGNOSIS — E1165 Type 2 diabetes mellitus with hyperglycemia: Secondary | ICD-10-CM | POA: Insufficient documentation

## 2013-11-25 DIAGNOSIS — N189 Chronic kidney disease, unspecified: Secondary | ICD-10-CM | POA: Diagnosis not present

## 2013-11-25 DIAGNOSIS — I5032 Chronic diastolic (congestive) heart failure: Secondary | ICD-10-CM | POA: Diagnosis present

## 2013-11-25 DIAGNOSIS — N183 Chronic kidney disease, stage 3 unspecified: Secondary | ICD-10-CM

## 2013-11-25 DIAGNOSIS — K625 Hemorrhage of anus and rectum: Secondary | ICD-10-CM

## 2013-11-25 DIAGNOSIS — E1129 Type 2 diabetes mellitus with other diabetic kidney complication: Secondary | ICD-10-CM

## 2013-11-25 NOTE — Telephone Encounter (Signed)
[name] with [facility] [phone] called about future labs. Appt in clinic canceled with Dr Royetta Crochet. Seeing Dr Golden Circle now. [name] ask if [facility] can have orders. Return call to [facility] left message - will try and work something out. To see what cardiology does in labs. No future orders in Epic except discharge summary. Pt needs HFU appt. Stanton Kidney Vannary Greening RN 11/25/13 4PM

## 2013-11-25 NOTE — Progress Notes (Signed)
Patient ID: [name], female   DOB: 04-18-1961, 53 y.o.   MRN: [id number] PCP: Dr. Burtis Junes (Internal Medicine)  53 yo with CHF probably secondary to severe MR now s/p MV repair, rheumatoid arthritis, schizophrenia, overdose of seroquel 2013, S/P St Jude ICD 2013, and PUD.  EF initially low, but most recent echo in 1/15 showed EF 50-55%, stable MV repair, RV dilated and hypokinetic but difficult images. Newly diagnosed DM2, Hgb A1C 13% in April.   Follow for Heart Failure: Recently weight up 20 lbs d/t traveling to [facility] and not taking medications for 1 week. Admitted over the weekend for rectal bleeding. Denies SOB, orthopnea, PND or CP. Walking around with walker about 3x a week. Continues to not drink sodas or eat bread. Weight at home 250-260 lbs. Has not noticed any more BRBPR. Now back on all her medications. Still on waiting list for gastric bypass. Reports following a low salt diet and Drinking > 2 liters per day. Followed by paramedicine.    Labs (11/14): K 4.1 Creatinine 1.96  Labs (1/15): K 3.2, creatinine 2.28 Labs (2/15): K 3.6, creatinine 2.05 Labs (08/26/13): K 3.0 Creatinine 1.6 Labs (11/22/13): K 3.6, creatinine 1.0  Allergies:  1) ! * Colchicine  2) ! Morphine   Past History:  1. Congestive heart failure, most likely secondary to severe mitral regurgitation. TEE (6/10): EF 40%, diffuse hypokinesis, mild to moderately dilated LV, severe eccentric MR, small PFO. Cardiac MRI (6/10): EF 37% with global hypokinesis, moderate to severe MR, no delayed enhancement (no evidence for sarcoidosis or other infiltrative disease). TTE (10/10) after MV repair: EF 25-30%, moderately dilated LV, moderate diastolic dysfunction, mildly depressed RV function, trivial MR, MV mean gradient 5 mmHg, PASP 30 mmHg. RHC (12/10): mean RA 19, PA 46/26, mean PCWP 28, CI 2.5, SVO2 63%. TTE (2/11): EF 35-40% with diffuse hypokinesis, no significant MR or MS. TTE (7/11): EF 45%, mild global hypokinesis,  s/p MV repair, no mitral regurgitation, minimal mitral stenosis, PA systolic pressure 40 mmHg, mild LV dilation.  TTE (11/12): EF 30-35%, mild LV dilation, mild LVH, s/p MV repair with no regurgitation and minimal stenosis.  TTE (3/13): EF 25-30%, diffuse hypokinesis, no significant mitral stenosis by pressure halftime with mean gradient 7 mmHg across valve, no MR.  Echo (6/13): EF 30-35%, mildly dilated LV, mild mitral stenosis but no MR.  St Jude ICD placed in 8/13. RHC (5/14): mean RA 10, PA 23/10, mean PCWP 17, CI 2.17.  Echo (11/14) with EF 40%, stable repaired MV, mild to moderately dilated RV with mildly decreased systolic function.  Echo (1/15) with EF 50-55%, s/p MV repair with no MS or significant MR, RV poorly visualized but appears hypokinetic and dilated.  2. Severe mitral regurgitation (see above). Minimally invasive mitral valve repair on 12/18/08. She additionally had TV repair and PFO closure.  3. Microcytic anemia: EGD and colonoscopy in 12/11 did not reveal source of bleeding.  4. H/o gastric ulcers.  5. Morbid obesity.  6. Obstructive sleep apnea, on CPAP.  7. GERD.  8. Hypertension, controlled.  9. Rheumatoid arthritis  10. Gout.  11. Depression.  12. LHC (6/10): No angiographic CAD. Mildly dilated LV. 3+ MR. EF 40%.  13. Prior smoking, quit  14. Colitis (2/11)  15. Schizophrenia versus schizoaffective disorder 16. H/o DVT: On Xarelto 17. Syncope while coughing.  18. DM  Family History:  Negative for cardiac or renal disease.  No FH of Colon Cancer  Social History:  Single, 5 children.  Unemployed.  Tobacco Use - Yes. Smoked < 1 ppd, quit 6/10.  Alcohol Use - no  Regular Exercise - no  Drug Use - no, hx crack-cocaine use  Daily Caffeine Use: 2 daily   Review of Systems  All systems reviewed and negative except as per HPI.  Current Outpatient Prescriptions  Medication Sig Dispense Refill  . albuterol (PROAIR HFA) 108 (90 BASE) MCG/ACT inhaler Inhale 2 puffs into  the lungs every 6 (six) hours as needed for wheezing or shortness of breath.  6.7 g  2  . amitriptyline (ELAVIL) 150 MG tablet Take 1 tablet (150 mg total) by mouth at bedtime.  30 tablet  0  . atorvastatin (LIPITOR) 40 MG tablet Take 1 tablet (40 mg total) by mouth daily.  30 tablet  11  . carvedilol (COREG) 12.5 MG tablet Take 1.5 tablets (18.75 mg total) by mouth 2 (two) times daily with a meal.  90 tablet  3  . diclofenac sodium (VOLTAREN) 1 % GEL Apply 2 g topically 4 (four) times daily.  100 g  2  . febuxostat (ULORIC) 40 MG tablet Take 80 mg by mouth daily.      . ferrous sulfate 325 (65 FE) MG tablet Take 1 tablet (325 mg total) by mouth 3 (three) times daily with meals.  90 tablet  3  . gabapentin (NEURONTIN) 100 MG capsule Take 1 capsule (100 mg total) by mouth 3 (three) times daily.  90 capsule  0  . glucose blood (FREESTYLE LITE) test strip Use as instructed  100 each  12  . hydrALAZINE (APRESOLINE) 50 MG tablet Take 1 tablet (50 mg total) by mouth 3 (three) times daily.  90 tablet  6  . HYDROcodone-acetaminophen (NORCO) 10-325 MG per tablet Take 1 tablet by mouth every 6 (six) hours as needed for severe pain.  120 tablet  0  . Insulin Detemir (LEVEMIR) 100 UNIT/ML Pen Inject 50 Units into the skin daily at 10 pm.  45 mL  1  . isosorbide mononitrate (IMDUR) 60 MG 24 hr tablet Take 90 mg by mouth daily.      . Magnesium Oxide 400 MG CAPS Take 1 capsule (400 mg total) by mouth 2 (two) times daily.  4 capsule  0  . metolazone (ZAROXOLYN) 5 MG tablet Take 1 tablet (5 mg total) by mouth 2 (two) times a week.  90 tablet  3  . nystatin (MYCOSTATIN/NYSTOP) 100000 UNIT/GM POWD Apply 1 Bottle topically 2 (two) times daily.  30 g  0  . omeprazole (PRILOSEC) 40 MG capsule Take 1 capsule (40 mg total) by mouth daily.  30 capsule  3  . potassium chloride SA (K-DUR,KLOR-CON) 20 MEQ tablet Take 20 mEq by mouth 2 (two) times daily.      . predniSONE (DELTASONE) 20 MG tablet Take 1 tablet (20 mg total)  by mouth daily.  3 tablet  0  . Rivaroxaban (XARELTO) 20 MG TABS tablet Take 20 mg by mouth every morning.      Marland Kitchen spironolactone (ALDACTONE) 25 MG tablet Take 25 mg by mouth daily.      Marland Kitchen torsemide (DEMADEX) 100 MG tablet Take 1 tablet (100 mg total) by mouth 2 (two) times daily.  180 tablet  3  . [DISCONTINUED] QUEtiapine (SEROQUEL) 100 MG tablet Take 400 mg by mouth at bedtime.        No current facility-administered medications for this encounter.   Filed Vitals:   11/25/13 1517  BP: 114/71  Pulse:  99  Resp: 18  Weight: 357 lb 8 oz (162.161 kg)  SpO2: 100%   General: NAD, obese. Sitting in WC Neck: Thick neck, JVP difficult to assess d/t body habitus but appears mildly elevated, no thyromegaly or thyroid nodule.  Lungs:Clear  CV: Nondisplaced PMI.  Heart regular S1/S2, no S3/S4, no murmur.   No carotid bruit.  Normal pedal pulses.  Abdomen: Soft, markedly obese. Nontender, mild distention.  Neurologic: Alert and oriented x 3. Moves all 4 without difficulty Psych: Normal affect. Extremities: No clubbing or cyanosis. 1+ bilateral edema  Assessment/Plan  1. Chronic diastolic CHF: NICM, EF 50-55%. Suspect that she has significant RV failure. St Jude ICD.  - NYHA class II-III symptoms (chronic, stable). Volume status slightly elevated patient was out of his diuretics for 1 week when traveling. Will give extra 2.5 mg of metolazone this Wednesday with extra potassium. Continue torsemide 100 mg BID. Recent renal function stable - Continue current Coreg, imdur, hydralazine and spironolactone - She is not on Ace or Arb due to elevated creatinine in the past, however has been more stable and can consider adding next visit if needed. - Reinforced the need and importance of daily weights, a low sodium diet, and fluid restriction (less than 2 L a day). Instructed to call the HF clinic if weight increases more than 3 lbs overnight or 5 lbs in a week.  2. CKD: reviewed BMET from 11/22/13 and renal  function stable..    3. Morbid obesity: Continue to work on weight loss and congratulated her on success so far. On waiting list for gastric bypass. Per Dr Shirlee Latch ok for  surgery.   4. DVT: Suspected DVT 11/14 admission.  She is on Xarelto, would probably continue long-term given her inactivity. Just discharged from the hospital for [facility]. PCP following and will need repeat CBC next week.  5. Mitral valve repair: Stable on 1/15 echo.  6. Uncontrolled DM - Last Hgb A1C down from 13.0 to 7.6. Congratulated patient on success and encouraged to continue to take medications as prescribed, watch her carb intake and to exercise.  7. Hypomagnesemia - Checked during hospitalization and was 1.4. Started on Mag oxide 400 mg BID. Continue and will need to recheck next visit.  8. Anemia - As above just discharged from the hospital for BRPBP. PCP following.   F/U 1 month [name] B NP-C 3:40 PM

## 2013-11-25 NOTE — Patient Instructions (Signed)
Doing great.  Try to start walking for about 5 minutes a day.  On Wednesday take 2.5 mg metolazone and an extra potassium (will be 3 tablets total).  Continue all other medications as prescribed.  Continue Magnesium Oxide 400 mg twice a day.  Call any issues.  F/U 1 month  Do the following things EVERYDAY: 1) Weigh yourself in the morning before breakfast. Write it down and keep it in a log. 2) Take your medicines as prescribed 3) Eat low salt foods-Limit salt (sodium) to 2000 mg per day.  4) Stay as active as you can everyday 5) Limit all fluids for the day to less than 2 liters 6)

## 2013-11-26 ENCOUNTER — Ambulatory Visit: Payer: Medicare Other | Admitting: Internal Medicine

## 2013-11-26 ENCOUNTER — Encounter: Payer: Self-pay | Admitting: Internal Medicine

## 2013-11-27 ENCOUNTER — Ambulatory Visit (INDEPENDENT_AMBULATORY_CARE_PROVIDER_SITE_OTHER): Payer: Medicare Other | Admitting: Internal Medicine

## 2013-11-27 ENCOUNTER — Telehealth: Payer: Self-pay | Admitting: *Deleted

## 2013-11-27 ENCOUNTER — Encounter: Payer: Medicare Other | Attending: General Surgery | Admitting: Dietician

## 2013-11-27 ENCOUNTER — Encounter: Payer: Self-pay | Admitting: Internal Medicine

## 2013-11-27 VITALS — BP 110/79 | HR 94 | Temp 99.1°F | Ht 66.0 in | Wt 352.3 lb

## 2013-11-27 DIAGNOSIS — E1129 Type 2 diabetes mellitus with other diabetic kidney complication: Secondary | ICD-10-CM | POA: Insufficient documentation

## 2013-11-27 DIAGNOSIS — N183 Chronic kidney disease, stage 3 unspecified: Secondary | ICD-10-CM | POA: Insufficient documentation

## 2013-11-27 DIAGNOSIS — K625 Hemorrhage of anus and rectum: Secondary | ICD-10-CM

## 2013-11-27 DIAGNOSIS — E876 Hypokalemia: Secondary | ICD-10-CM

## 2013-11-27 DIAGNOSIS — Z713 Dietary counseling and surveillance: Secondary | ICD-10-CM | POA: Insufficient documentation

## 2013-11-27 DIAGNOSIS — D509 Iron deficiency anemia, unspecified: Secondary | ICD-10-CM

## 2013-11-27 LAB — BASIC METABOLIC PANEL WITH GFR
BUN: 47 mg/dL — AB (ref 6–23)
CHLORIDE: 93 meq/L — AB (ref 96–112)
CO2: 35 mEq/L — ABNORMAL HIGH (ref 19–32)
CREATININE: 1.58 mg/dL — AB (ref 0.50–1.10)
Calcium: 8.7 mg/dL (ref 8.4–10.5)
GFR, EST AFRICAN AMERICAN: 43 mL/min — AB
GFR, EST NON AFRICAN AMERICAN: 37 mL/min — AB
GLUCOSE: 110 mg/dL — AB (ref 70–99)
Potassium: 3.1 mEq/L — ABNORMAL LOW (ref 3.5–5.3)
Sodium: 140 mEq/L (ref 135–145)

## 2013-11-27 LAB — CBC
HEMATOCRIT: 37.8 % (ref 36.0–46.0)
Hemoglobin: 12.5 g/dL (ref 12.0–15.0)
MCH: 25.8 pg — ABNORMAL LOW (ref 26.0–34.0)
MCHC: 33.1 g/dL (ref 30.0–36.0)
MCV: 78.1 fL (ref 78.0–100.0)
Platelets: 268 10*3/uL (ref 150–400)
RBC: 4.84 MIL/uL (ref 3.87–5.11)
RDW: 17.9 % — ABNORMAL HIGH (ref 11.5–15.5)
WBC: 5.9 10*3/uL (ref 4.0–10.5)

## 2013-11-27 LAB — MAGNESIUM: Magnesium: 1.6 mg/dL (ref 1.5–2.5)

## 2013-11-27 NOTE — Assessment & Plan Note (Signed)
Patient had low Mg, low K during the recent hospitalization (11/21/13 to 11/22/13) that was repleted accordingly.  Plans: Check BMP, Mg. Will follow up.

## 2013-11-27 NOTE — Telephone Encounter (Signed)
Call from [facility] with [facility] - # [phone]  Pt called [facility] nurse with c/o severe cramping  pain to both calf's. Pt states it feels like a muscle spasm.  Onset last night X 2 and again this morning.  She has been taking her potassium .   Labs are being drawn tomorrow, in the home, by home health, ( CBC ) do you want to add any labs?  Pt also wants refill on pain meds.  She lost some of hers, only has 9 left. Last refill on vicodin # 125 on 6/25.  Pt given an appointment for 2:15 in clinic today.  She is trying to find a ride.  She will call back and confirm appointment after lunch.

## 2013-11-27 NOTE — Patient Instructions (Signed)
-  Continue to limit sweets and portion sizes -Keep walking! Increase as tolerated -Look into getting another set of dentures 

## 2013-11-27 NOTE — Assessment & Plan Note (Signed)
Patient was FOBT + and was hospitalized from 11/21/13 to 11/22/13 No further episodes of bleeding as per patient. H/O FOBT + in 2012 and underwent EGD, Colonoscopy, Capsule endoscopy. Patient was found to have diverticuli in the sigmoid colon that was thought to be bleeding source. Patient also has chronic iron deficiency anemia and her baseline is 9.4 to 11.7 in the last one year.  Plans: Check CBC. GI referral for further management. Continue current management. Recommended to call the clinic or seek medical help if she has any further episodes of bleeding.
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
## 2013-11-27 NOTE — Telephone Encounter (Signed)
Pt arrived for appointment.

## 2013-11-27 NOTE — Progress Notes (Signed)
  Medical Nutrition Therapy:  Appt start time: 315 end time:  330.  4th SWL visit: [name] returns today for her 4th SWL visit. She has lost several pounds in the last 2 days, likely fluid. She has lost almost 10 pounds since her last visit here. She states that she has been complying with her fluid restriction. Still needs to get another set of dentures. Continuing to limit sweets and sodas.  Wt Readings from Last 3 Encounters:  11/27/13 352 lb 4 oz (159.78 kg)  11/27/13 352 lb 4.8 oz (159.802 kg)  11/25/13 357 lb 8 oz (162.161 kg)   Ht Readings from Last 3 Encounters:  11/27/13 5\' 6"  (1.676 m)  11/27/13 5\' 6"  (1.676 m)  11/22/13 5\' 6"  (1.676 m)   Body mass index is 56.88 kg/(m^2). @BMIFA @ Normalized weight-for-age data available only for age 31 to 20 years. Normalized stature-for-age data available only for age 31 to 20 years.   Preferred Learning Style:  No preference indicated   Learning Readiness:  Ready  MEDICATIONS: see list   DIETARY INTAKE:  24-hr recall:  B ( AM): flavored oatmeal  Snk ( AM): none  L ( PM): leftovers from the night before Snk ( PM): none D ( PM): fish, potatoes, green beans Snk ( PM): apples and vinegar  Beverages: Crystal Light, ice, coffee  Usual physical activity: walking outside for more than 5 minutes every day  Estimated energy needs: 1600 calories  Progress Towards Goal(s):  Some progress.   Nutritional Diagnosis:  Graham-3.3 Overweight/obesity related to past poor dietary habits and physical inactivity as evidenced by patient w/ pending Gastric Sleeve surgery following dietary guidelines for continued weight loss.     Intervention:  Nutrition counseling provided. Reviewed pre-op goals and protein shakes per patient request.  Teaching Method Utilized: Visual Auditory   Barriers to learning/adherence to lifestyle change: physical disability  Demonstrated degree of understanding via:  Teach Back   Monitoring/Evaluation:   Dietary intake, exercise, and body weight in 4 week(s).

## 2013-11-27 NOTE — Patient Instructions (Signed)
We will contact you if any of your labs are abnormal. Please follow up with your gastroenterologist as recommended. Continue all the medications as recommended below.

## 2013-11-28 ENCOUNTER — Telehealth: Payer: Self-pay | Admitting: *Deleted

## 2013-11-28 NOTE — Progress Notes (Signed)
Case discussed with Dr. Boggala at the time of the visit.  We reviewed the resident's history and exam and pertinent patient test results.  I agree with the assessment, diagnosis, and plan of care documented in the resident's note. 

## 2013-11-28 NOTE — Telephone Encounter (Signed)
[name] calls and leaves a message that pt is concerned about lab results from yesterday's visit, states she has not had a call from the md to discuss the results also pt stated she was not ask to collect a urine sample at the visit, [name] attempted to collect one during her visit today and could not, she states she left a cup and ask pt to collect the urine and bring it to [facility] clinic. Is this acceptable? And could you call her and discuss labs?

## 2013-11-28 NOTE — Telephone Encounter (Signed)
I returned a call to patient since Dr. Comer Locket is not in clinic today.  Patient had called asking about her lab results.  Her labs are notable for low potassium of 3.1, BUN 47, creatinine 1.58, bicarbonate 35, hemoglobin 12.5, magnesium 1.6.  Her BUN and creatinine are in line with previous values, and may reflect diuretic effect; she was seen 3 days ago in the heart failure clinic and they are managing her diuretics.  I inquired about her current potassium chloride dose; her medication list most recently gives a dose of 20 mEq twice a day; however, her previous dose was 40 mEq twice a day.  She stated without prompting that she is still taking two of the 20 mEq tablets twice a day (total dose of 40 mEq twice a day), and she retrieved her medication bottle and confirmed this to me by telephone.  I instructed her to increase her potassium chloride dose from two 20 meQ tablets twice a day to a dose of two 20 meQ tablets 3 times a day for the next 3 days.  She has a return appointment here on Monday 12/02/2013, and should have her basic metabolic panel rechecked at that time.  Patient reports recent strong smelling urine, but denies any dysuria, frequency, or other symptoms of UTI; I advised her that if her home health nurse can bring a urine specimen in to clinic, then we can perform a urinalysis.  I advised her to call immediately if she has declining urine output or other problems.

## 2013-12-02 ENCOUNTER — Other Ambulatory Visit: Payer: Self-pay | Admitting: Internal Medicine

## 2013-12-02 ENCOUNTER — Ambulatory Visit (INDEPENDENT_AMBULATORY_CARE_PROVIDER_SITE_OTHER): Payer: Medicare Other | Admitting: Internal Medicine

## 2013-12-02 ENCOUNTER — Ambulatory Visit: Payer: Medicare Other | Admitting: Internal Medicine

## 2013-12-02 ENCOUNTER — Encounter: Payer: Self-pay | Admitting: Internal Medicine

## 2013-12-02 VITALS — BP 118/85 | HR 90 | Temp 97.7°F | Ht 66.0 in | Wt 350.7 lb

## 2013-12-02 DIAGNOSIS — M1710 Unilateral primary osteoarthritis, unspecified knee: Secondary | ICD-10-CM

## 2013-12-02 DIAGNOSIS — M069 Rheumatoid arthritis, unspecified: Secondary | ICD-10-CM

## 2013-12-02 DIAGNOSIS — R350 Frequency of micturition: Secondary | ICD-10-CM

## 2013-12-02 DIAGNOSIS — IMO0001 Reserved for inherently not codable concepts without codable children: Secondary | ICD-10-CM

## 2013-12-02 DIAGNOSIS — K625 Hemorrhage of anus and rectum: Secondary | ICD-10-CM

## 2013-12-02 DIAGNOSIS — IMO0002 Reserved for concepts with insufficient information to code with codable children: Secondary | ICD-10-CM

## 2013-12-02 DIAGNOSIS — M25561 Pain in right knee: Secondary | ICD-10-CM

## 2013-12-02 DIAGNOSIS — E876 Hypokalemia: Secondary | ICD-10-CM

## 2013-12-02 DIAGNOSIS — R928 Other abnormal and inconclusive findings on diagnostic imaging of breast: Secondary | ICD-10-CM

## 2013-12-02 DIAGNOSIS — E1129 Type 2 diabetes mellitus with other diabetic kidney complication: Secondary | ICD-10-CM

## 2013-12-02 DIAGNOSIS — E1165 Type 2 diabetes mellitus with hyperglycemia: Secondary | ICD-10-CM

## 2013-12-02 DIAGNOSIS — M25569 Pain in unspecified knee: Secondary | ICD-10-CM

## 2013-12-02 DIAGNOSIS — M25562 Pain in left knee: Secondary | ICD-10-CM

## 2013-12-02 DIAGNOSIS — N3941 Urge incontinence: Secondary | ICD-10-CM

## 2013-12-02 DIAGNOSIS — E119 Type 2 diabetes mellitus without complications: Secondary | ICD-10-CM

## 2013-12-02 LAB — GLUCOSE, CAPILLARY: Glucose-Capillary: 105 mg/dL — ABNORMAL HIGH (ref 70–99)

## 2013-12-02 MED ORDER — HYDROCODONE-ACETAMINOPHEN 10-325 MG PO TABS: 1.0000 | ORAL_TABLET | Freq: Four times a day (QID) | ORAL | Status: DC | PRN

## 2013-12-02 NOTE — Progress Notes (Signed)
   Subjective:    Patient ID: [name], female    DOB: Dec 03, 1960, 53 y.o.   MRN: [id number]  HPI Comments: Ms. Spagnolo is a 53 year old woman with a PMH of HTN, severe mitral regurgitation (s/p MV repair 2010), non-ischemic cardiomyopathy (s/p ICD placement), dHF (EF 50-55% Jan 2015), DM type 2 (Hgb A1c 7.18 November 2013), CKD3, DJD, RA and gout.  She was recently admitted for BRBPR and followed up in [facility] last week.  CBC at that visit revealed stable Hgb of 12.5.  BMP revealed hypokalemia (K 3.1).  She was instructed to increase her K to TID x 3 days.  She is here for follow-up and BMP.  She says she did increase her K but she thought she only needed to do so for 2 days.  She is now back to taking BID.  She denies any recurrent bleeding since discharge.  She did have 3 episodes of N/V yesterday morning after eating leftover fish.  No fever/chills, diarrhea, abdominal pain or bleeding.  Resumed normal diet yesterday evening without problem.  N/V has resolved since yesterday.  She also notes foul smelling urine x 1.5 weeks.  Denies dysuria.  + frequency.       Review of Systems  Constitutional: Negative for fever, chills and appetite change.  Eyes: Negative for visual disturbance.  Respiratory: Negative for shortness of breath.   Cardiovascular: Negative for chest pain, palpitations and leg swelling.  Gastrointestinal: Positive for nausea and vomiting. Negative for abdominal pain, diarrhea, constipation, blood in stool and rectal pain.       Acute N/V yesterday has resolved.  Genitourinary: Positive for frequency. Negative for dysuria, hematuria and vaginal discharge.  Musculoskeletal: Positive for arthralgias and back pain.       Chronic  Neurological: Negative for syncope and weakness.  Hematological: Does not bruise/bleed easily.       Objective:   Physical Exam  Vitals reviewed. Constitutional: She is oriented to person, place, and time. No distress.  HENT:  Head:  Normocephalic and atraumatic.  Mouth/Throat: Oropharynx is clear and moist. No oropharyngeal exudate.  Eyes: EOM are normal. Pupils are equal, round, and reactive to light.  Cardiovascular: Normal rate, regular rhythm and normal heart sounds.  Exam reveals no gallop and no friction rub.   No murmur heard. Pulmonary/Chest: Breath sounds normal. No respiratory distress. She has no wheezes. She has no rales.  Abdominal: Soft. Bowel sounds are normal. She exhibits no distension. There is no tenderness.  Musculoskeletal: Normal range of motion. She exhibits tenderness. She exhibits no edema.  B/L knees TTP (L>R); legs/calves are non-TTP  Neurological: She is alert and oriented to person, place, and time. No cranial nerve deficit.  Skin: Skin is warm. She is not diaphoretic.  Psychiatric: She has a normal mood and affect. Her behavior is normal.          Assessment & Plan:  Please see problem based assessment and plan.

## 2013-12-02 NOTE — Patient Instructions (Signed)
1. I will call you if there are problems with your labs.  For now, please keep taking potassium 2 tablets ( ) two times per day.  I will also call after we check your urine to let you know if you need an antibiotic.   2. Please take all medications as prescribed.    3. If you have worsening of your symptoms or new symptoms arise, please call the clinic ([phone]), or go to the ER immediately if symptoms are severe.    Return to clinic in 1 month for follow-up with your new primary care doctor.

## 2013-12-02 NOTE — Assessment & Plan Note (Addendum)
Small 43mm nodular density in right breast on 07/2013 diagnostic mammo.  6 month diagnostic mammo and perhaps [place] recommended.   - prior PCP has already placed referral for right diagnostic mammo - will follow-up to be sure this gets scheduled for 01/2014

## 2013-12-02 NOTE — Assessment & Plan Note (Signed)
Denies further bleeding since discharge.  Hgb stable 12.5 on 11/27/13.

## 2013-12-02 NOTE — Assessment & Plan Note (Signed)
Her BG meter could not be downloaded at this visit due to technical problem.  Her CBG is 110.  She reports compliance with insulin regimen.

## 2013-12-02 NOTE — Assessment & Plan Note (Addendum)
Frequency and foul smell to urine.  She reports recent UTI.  No fevers/chills, dysuria. VSS today.  POC dipstick negative for nitrites, positive for trace leukocytes. - awaiting lab UA - urine culture sent - informed patient that I will call her and send antibiotic to pharmacy if she has a UTI

## 2013-12-02 NOTE — Assessment & Plan Note (Signed)
She continues to have chronic knee pain 2/2 to DJD.  She is asking for refill of her Vicodin 10/325 (last rx 11/07/13 #120).  She tells me she ran out because she started taking 2 at 1 time when she had a gout flare.  She will having bariatric surgery in the next 3 months to assist with weight loss which will hopefully reduce some of her knee pain. - refill Vicodin 10/325mg  q6h prn #120 (per pain contract) - 1 rx provided, not to be filled before 12/07/13.  No refill rx given today.   - RTC in 1 month for follow-up with PCP

## 2013-12-02 NOTE — Progress Notes (Signed)
Case discussed with Dr. Wilson at the time of the visit.  We reviewed the resident's history and exam and pertinent patient test results.  I agree with the assessment, diagnosis, and plan of care documented in the resident's note. 

## 2013-12-02 NOTE — Assessment & Plan Note (Signed)
She continues to take Mg supplements prescribed at discharge.  Mg at follow-up last week was low normal, 1.6. - check Mg today and decide if she needs to continue supplementation

## 2013-12-02 NOTE — Assessment & Plan Note (Addendum)
Likely 2/2 to diuretic use.  She reports compliance with supplement and did increase supplementation for 2 days. Mg normal, 1.6. - recheck BMP and Mg today

## 2013-12-02 NOTE — Assessment & Plan Note (Signed)
She reports she was last seen by her rheumatologist last month and was started on Prednisone 20mg  daily.  She says she is due to follow-up next month.  There are no DMARDs on her medication list.  I am concerned about long term steroid use given her DM and obesity. - sending for rheumatology records

## 2013-12-03 ENCOUNTER — Telehealth: Payer: Self-pay | Admitting: Internal Medicine

## 2013-12-03 ENCOUNTER — Other Ambulatory Visit: Payer: Self-pay | Admitting: Nurse Practitioner

## 2013-12-03 LAB — URINALYSIS, MICROSCOPIC ONLY
BACTERIA UA: NONE SEEN
CRYSTALS: NONE SEEN
Casts: NONE SEEN

## 2013-12-03 LAB — BASIC METABOLIC PANEL WITH GFR
BUN: 75 mg/dL — AB (ref 6–23)
CHLORIDE: 86 meq/L — AB (ref 96–112)
CO2: 33 mEq/L — ABNORMAL HIGH (ref 19–32)
CREATININE: 2.36 mg/dL — AB (ref 0.50–1.10)
Calcium: 9.9 mg/dL (ref 8.4–10.5)
GFR, EST NON AFRICAN AMERICAN: 23 mL/min — AB
GFR, Est African American: 27 mL/min — ABNORMAL LOW
Glucose, Bld: 100 mg/dL — ABNORMAL HIGH (ref 70–99)
Potassium: 3.3 mEq/L — ABNORMAL LOW (ref 3.5–5.3)
Sodium: 133 mEq/L — ABNORMAL LOW (ref 135–145)

## 2013-12-03 LAB — URINALYSIS, ROUTINE W REFLEX MICROSCOPIC
Bilirubin Urine: NEGATIVE
Glucose, UA: NEGATIVE mg/dL
Hgb urine dipstick: NEGATIVE
Ketones, ur: NEGATIVE mg/dL
Nitrite: NEGATIVE
PH: 7 (ref 5.0–8.0)
Protein, ur: NEGATIVE mg/dL
SPECIFIC GRAVITY, URINE: 1.009 (ref 1.005–1.030)
Urobilinogen, UA: 0.2 mg/dL (ref 0.0–1.0)

## 2013-12-03 LAB — MAGNESIUM: Magnesium: 1.7 mg/dL (ref 1.5–2.5)

## 2013-12-03 NOTE — Telephone Encounter (Signed)
I called and spoke with [name] regarding her lab results.  Cr is elevated, bicarbonate high, K low.  I am worried that she may be over-diuresed.  Her metolazone is normally twice weekly but she was volume overloaded at visit to cardiology last week and was instructed to take an extra dose of metolazone.  She notes episode of N/V on over the weekend and again yesterday after she left clinic which also may be contributing.  She says she has not vomited today and is tolerating po.  I advised her to come to clinic for an acute visit tomorrow and for repeat labs.  Her diuretic dose may need to be reduced.

## 2013-12-04 ENCOUNTER — Encounter: Payer: Self-pay | Admitting: *Deleted

## 2013-12-04 ENCOUNTER — Ambulatory Visit (INDEPENDENT_AMBULATORY_CARE_PROVIDER_SITE_OTHER): Payer: Medicare Other | Admitting: Internal Medicine

## 2013-12-04 ENCOUNTER — Encounter: Payer: Self-pay | Admitting: Internal Medicine

## 2013-12-04 VITALS — BP 121/85 | HR 93 | Temp 97.3°F | Wt 351.3 lb

## 2013-12-04 DIAGNOSIS — IMO0001 Reserved for inherently not codable concepts without codable children: Secondary | ICD-10-CM

## 2013-12-04 DIAGNOSIS — N183 Chronic kidney disease, stage 3 unspecified: Secondary | ICD-10-CM | POA: Insufficient documentation

## 2013-12-04 DIAGNOSIS — K625 Hemorrhage of anus and rectum: Secondary | ICD-10-CM | POA: Diagnosis not present

## 2013-12-04 DIAGNOSIS — N179 Acute kidney failure, unspecified: Secondary | ICD-10-CM | POA: Insufficient documentation

## 2013-12-04 DIAGNOSIS — E876 Hypokalemia: Secondary | ICD-10-CM

## 2013-12-04 DIAGNOSIS — R350 Frequency of micturition: Secondary | ICD-10-CM

## 2013-12-04 DIAGNOSIS — E873 Alkalosis: Secondary | ICD-10-CM | POA: Insufficient documentation

## 2013-12-04 LAB — BASIC METABOLIC PANEL
BUN: 73 mg/dL — ABNORMAL HIGH (ref 6–23)
CHLORIDE: 88 meq/L — AB (ref 96–112)
CO2: 33 mEq/L — ABNORMAL HIGH (ref 19–32)
CREATININE: 2.34 mg/dL — AB (ref 0.50–1.10)
Calcium: 10 mg/dL (ref 8.4–10.5)
Glucose, Bld: 107 mg/dL — ABNORMAL HIGH (ref 70–99)
Potassium: 3.4 mEq/L — ABNORMAL LOW (ref 3.5–5.3)
Sodium: 136 mEq/L (ref 135–145)

## 2013-12-04 LAB — MAGNESIUM: MAGNESIUM: 1.9 mg/dL (ref 1.5–2.5)

## 2013-12-04 LAB — URINE CULTURE: Colony Count: >=100000

## 2013-12-04 MED ORDER — LEVOFLOXACIN 250 MG PO TABS: 250.0000 mg | ORAL_TABLET | Freq: Every day | ORAL | Status: DC

## 2013-12-04 NOTE — Progress Notes (Signed)
   Subjective:    Patient ID: [name], female    DOB: 1960-12-19, 53 y.o.   MRN: [id number]  HPI Comments:  Ms. Madera is a 53 year old woman with a PMH of HTN, severe mitral regurgitation (s/p MV repair 2010), non-ischemic cardiomyopathy (s/p ICD placement), dHF (EF 50-55% Jan 2015), DM type 2 (Hgb A1c 7.18 November 2013), CKD3, DJD, RA and gout.  She saw me for follow-up of hypokalemia two days ago.  Labs at that time revealed AKI on CKD, increased bicarb and low K.  I asked her to come in today for repeat labs.  She has been taking BID (long-term med).  She has been taking torsemide 100mg  BID and metolazone 2.5mg  (Monday and Friday).  She had N/V on Sunday and Monday.  No N/V since Monday.  No belly pain, diarrhea. No sick contacts.  She has been eating and drinking normally.       Review of Systems  Constitutional: Negative for fever, chills and appetite change.  Respiratory: Negative for shortness of breath.   Cardiovascular: Negative for chest pain, palpitations and leg swelling.  Gastrointestinal: Positive for nausea, vomiting and constipation. Negative for abdominal pain and diarrhea.       On Sunday and Monday.  Has resolved.  Genitourinary: Negative for dysuria.  Neurological: Negative for weakness.       Objective:   Physical Exam  Vitals reviewed. Constitutional: She is oriented to person, place, and time. No distress.  HENT:  Head: Normocephalic and atraumatic.  Mouth/Throat: Oropharynx is clear and moist. No oropharyngeal exudate.  Eyes: EOM are normal. Pupils are equal, round, and reactive to light.  Neck: No JVD present.  Cardiovascular: Normal rate and regular rhythm.  Exam reveals no gallop and no friction rub.   No murmur heard. Pulmonary/Chest: Effort normal and breath sounds normal. No respiratory distress. She has no wheezes. She has no rales.  Abdominal: Soft. Bowel sounds are normal. She exhibits no distension. There is no tenderness.  No CVAT    Musculoskeletal: Normal range of motion. She exhibits edema.  Trace B/L lower extremity edema.   Neurological: She is alert and oriented to person, place, and time. No cranial nerve deficit.  Skin: Skin is warm. She is not diaphoretic.  Psychiatric: She has a normal mood and affect. Her behavior is normal.          Assessment & Plan:  Please see problem based assessment and plan.

## 2013-12-04 NOTE — Assessment & Plan Note (Addendum)
Elevated bicarb in the setting of significant diuretic.  Recent HF notes (11/25/13) mentioned patient was up 20 pounds.  However, looking at clinic trend she appears to be down about 10 pounds in recent months and has labs suggesting contraction alkalosis.  She also had two isolated episodes of N/V earlier this week which could be contributing.  She says this has resolved, she is eating and drinking normally, no NSAID use.   - repeat BMP - may need to stop twice weekly metolazone and refer her back to HF clinic if lab abnormalities persist  ADDENDUM:  Cr, BUN, bicarb still elevated.  I have called the patient and asked that she not take her next dose of metolazone (she normally takes it on Monday and Friday so next dose would be tomorrow).  An appointment has been arranged for her to return to HF clinic for evaluation of volume status and potential decrease in diuretics. I informed her that her appointment is 12/09/13 at [facility] and she says she will be able to make it.

## 2013-12-04 NOTE — Assessment & Plan Note (Signed)
She is compliant with K supplementation.  Likely 2/2 to diuretic use.  Rechecking BMP today.

## 2013-12-04 NOTE — Assessment & Plan Note (Signed)
Urine culture positive.  Will prescribed Levaquin 250mg  daily x 3 days (no dosage change given Cr clearance > 50).  She was advised to call clinic if symptoms persist after treatment.

## 2013-12-04 NOTE — Patient Instructions (Signed)
1. Please take Levaquin once daily for your UTI.  I will call you regarding your labwork.   2. Please take all medications as prescribed.      3. If you have worsening of your symptoms or new symptoms arise, please call the clinic ([phone]), or go to the ER immediately if symptoms are severe.

## 2013-12-05 ENCOUNTER — Encounter: Payer: Self-pay | Admitting: Gastroenterology

## 2013-12-05 NOTE — Progress Notes (Signed)
Case discussed with Dr. Wilson at the time of the visit.  We reviewed the resident's history and exam and pertinent patient test results.  I agree with the assessment, diagnosis, and plan of care documented in the resident's note. 

## 2013-12-09 ENCOUNTER — Encounter (HOSPITAL_COMMUNITY): Payer: Self-pay

## 2013-12-09 ENCOUNTER — Ambulatory Visit (HOSPITAL_COMMUNITY)
Admission: RE | Admit: 2013-12-09 | Discharge: 2013-12-09 | Disposition: A | Discharge status: Home / Self Care | Payer: Medicare Other | Source: Ambulatory Visit | Attending: Internal Medicine | Admitting: Internal Medicine

## 2013-12-09 VITALS — BP 90/62 | HR 99 | Wt 360.8 lb

## 2013-12-09 DIAGNOSIS — M069 Rheumatoid arthritis, unspecified: Secondary | ICD-10-CM | POA: Diagnosis not present

## 2013-12-09 DIAGNOSIS — K219 Gastro-esophageal reflux disease without esophagitis: Secondary | ICD-10-CM | POA: Insufficient documentation

## 2013-12-09 DIAGNOSIS — I509 Heart failure, unspecified: Secondary | ICD-10-CM | POA: Insufficient documentation

## 2013-12-09 DIAGNOSIS — I428 Other cardiomyopathies: Secondary | ICD-10-CM | POA: Diagnosis not present

## 2013-12-09 DIAGNOSIS — I82409 Acute embolism and thrombosis of unspecified deep veins of unspecified lower extremity: Secondary | ICD-10-CM | POA: Insufficient documentation

## 2013-12-09 DIAGNOSIS — N179 Acute kidney failure, unspecified: Secondary | ICD-10-CM

## 2013-12-09 DIAGNOSIS — Z87891 Personal history of nicotine dependence: Secondary | ICD-10-CM | POA: Diagnosis not present

## 2013-12-09 DIAGNOSIS — I5032 Chronic diastolic (congestive) heart failure: Secondary | ICD-10-CM | POA: Diagnosis not present

## 2013-12-09 DIAGNOSIS — I129 Hypertensive chronic kidney disease with stage 1 through stage 4 chronic kidney disease, or unspecified chronic kidney disease: Secondary | ICD-10-CM | POA: Diagnosis not present

## 2013-12-09 DIAGNOSIS — Z09 Encounter for follow-up examination after completed treatment for conditions other than malignant neoplasm: Secondary | ICD-10-CM | POA: Insufficient documentation

## 2013-12-09 DIAGNOSIS — G4733 Obstructive sleep apnea (adult) (pediatric): Secondary | ICD-10-CM | POA: Insufficient documentation

## 2013-12-09 DIAGNOSIS — N189 Chronic kidney disease, unspecified: Secondary | ICD-10-CM | POA: Diagnosis not present

## 2013-12-09 DIAGNOSIS — J209 Acute bronchitis, unspecified: Secondary | ICD-10-CM

## 2013-12-09 DIAGNOSIS — Z79899 Other long term (current) drug therapy: Secondary | ICD-10-CM | POA: Diagnosis not present

## 2013-12-09 DIAGNOSIS — Z9889 Other specified postprocedural states: Secondary | ICD-10-CM | POA: Diagnosis not present

## 2013-12-09 DIAGNOSIS — IMO0001 Reserved for inherently not codable concepts without codable children: Secondary | ICD-10-CM | POA: Insufficient documentation

## 2013-12-09 DIAGNOSIS — Z794 Long term (current) use of insulin: Secondary | ICD-10-CM | POA: Diagnosis not present

## 2013-12-09 DIAGNOSIS — E1165 Type 2 diabetes mellitus with hyperglycemia: Secondary | ICD-10-CM

## 2013-12-09 DIAGNOSIS — N289 Disorder of kidney and ureter, unspecified: Secondary | ICD-10-CM | POA: Diagnosis not present

## 2013-12-09 DIAGNOSIS — J40 Bronchitis, not specified as acute or chronic: Secondary | ICD-10-CM

## 2013-12-09 LAB — PRO B NATRIURETIC PEPTIDE: Pro B Natriuretic peptide (BNP): 553 pg/mL — ABNORMAL HIGH (ref 0–125)

## 2013-12-09 LAB — BASIC METABOLIC PANEL
ANION GAP: 11 (ref 5–15)
BUN: 45 mg/dL — ABNORMAL HIGH (ref 6–23)
CO2: 33 meq/L — AB (ref 19–32)
CREATININE: 1.64 mg/dL — AB (ref 0.50–1.10)
Calcium: 9.4 mg/dL (ref 8.4–10.5)
Chloride: 96 mEq/L (ref 96–112)
GFR calc Af Amer: 41 mL/min — ABNORMAL LOW (ref >90)
GFR calc non Af Amer: 35 mL/min — ABNORMAL LOW (ref >90)
Glucose, Bld: 111 mg/dL — ABNORMAL HIGH (ref 70–99)
Potassium: 3.8 mEq/L (ref 3.7–5.3)
Sodium: 140 mEq/L (ref 137–147)

## 2013-12-09 MED ORDER — DOXYCYCLINE HYCLATE 50 MG PO CAPS: 100.0000 mg | ORAL_CAPSULE | Freq: Two times a day (BID) | ORAL | Status: DC

## 2013-12-09 NOTE — Patient Instructions (Signed)
Labs today  Your physician recommends that you schedule a follow-up appointment in: 4 weeks  Do the following things EVERYDAY: 1) Weigh yourself in the morning before breakfast. Write it down and keep it in a log. 2) Take your medicines as prescribed 3) Eat low salt foods-Limit salt (sodium) to 2000 mg per day.  4) Stay as active as you can everyday 5) Limit all fluids for the day to less than 2 liters 6)   

## 2013-12-09 NOTE — Progress Notes (Signed)
Patient ID: [name], female   DOB: October 18, 1960, 53 y.o.   MRN: [id number]  PCP: Dr. Burtis Junes (Internal Medicine)  53 yo with CHF probably secondary to severe MR now s/p MV repair, rheumatoid arthritis, schizophrenia, overdose of seroquel 2013, S/P St Jude ICD 2013, and PUD.  EF initially low, but most recent echo in 1/15 showed EF 50-55%, stable MV repair, RV dilated and hypokinetic but difficult images. Newly diagnosed DM2, Hgb A1C 13% in April. CKD (Cr 1.4-1.7)  Follow for Heart Failure: Seen in [facility] last week. Was taking metolazone 2x/week but this was stopped due to Cr up to 2.3. Has not taken metolazone. Weight up 10 pounds from previous. Eating a LOT of ice including here in front of me throughout visit. Continues to follow with Paramedicine. Weighing every day. Denies dysnea or edema. Taking torsemide 100 bid.  Continues to not drink sodas or eat bread. Has hacking cough. No fevers/chills. + yellow sputum Going on for 1 week.    Labs (11/14): K 4.1 Creatinine 1.96  Labs (1/15): K 3.2, creatinine 2.28 Labs (2/15): K 3.6, creatinine 2.05 Labs (08/26/13): K 3.0 Creatinine 1.6 Labs (11/22/13): K 3.6, creatinine 1.0 Labs (12/04/13): K 3.4, creatinine 2.34  Allergies:  1) ! * Colchicine  2) ! Morphine   Past History:  1. Congestive heart failure, most likely secondary to severe mitral regurgitation. TEE (6/10): EF 40%, diffuse hypokinesis, mild to moderately dilated LV, severe eccentric MR, small PFO. Cardiac MRI (6/10): EF 37% with global hypokinesis, moderate to severe MR, no delayed enhancement (no evidence for sarcoidosis or other infiltrative disease). TTE (10/10) after MV repair: EF 25-30%, moderately dilated LV, moderate diastolic dysfunction, mildly depressed RV function, trivial MR, MV mean gradient 5 mmHg, PASP 30 mmHg. RHC (12/10): mean RA 19, PA 46/26, mean PCWP 28, CI 2.5, SVO2 63%. TTE (2/11): EF 35-40% with diffuse hypokinesis, no significant MR or MS. TTE (7/11): EF 45%,  mild global hypokinesis, s/p MV repair, no mitral regurgitation, minimal mitral stenosis, PA systolic pressure 40 mmHg, mild LV dilation.  TTE (11/12): EF 30-35%, mild LV dilation, mild LVH, s/p MV repair with no regurgitation and minimal stenosis.  TTE (3/13): EF 25-30%, diffuse hypokinesis, no significant mitral stenosis by pressure halftime with mean gradient 7 mmHg across valve, no MR.  Echo (6/13): EF 30-35%, mildly dilated LV, mild mitral stenosis but no MR.  St Jude ICD placed in 8/13. RHC (5/14): mean RA 10, PA 23/10, mean PCWP 17, CI 2.17.  Echo (11/14) with EF 40%, stable repaired MV, mild to moderately dilated RV with mildly decreased systolic function.  Echo (1/15) with EF 50-55%, s/p MV repair with no MS or significant MR, RV poorly visualized but appears hypokinetic and dilated.  2. Severe mitral regurgitation (see above). Minimally invasive mitral valve repair on 12/18/08. She additionally had TV repair and PFO closure.  3. Microcytic anemia: EGD and colonoscopy in 12/11 did not reveal source of bleeding.  4. H/o gastric ulcers.  5. Morbid obesity.  6. Obstructive sleep apnea, on CPAP.  7. GERD.  8. Hypertension, controlled.  9. Rheumatoid arthritis  10. Gout.  11. Depression.  12. LHC (6/10): No angiographic CAD. Mildly dilated LV. 3+ MR. EF 40%.  13. Prior smoking, quit  14. Colitis (2/11)  15. Schizophrenia versus schizoaffective disorder 16. H/o DVT: On Xarelto 17. Syncope while coughing.  18. DM  Family History:  Negative for cardiac or renal disease.  No FH of Colon Cancer  Social  History:  Single, 5 children. Unemployed.  Tobacco Use - Yes. Smoked < 1 ppd, quit 6/10.  Alcohol Use - no  Regular Exercise - no  Drug Use - no, hx crack-cocaine use  Daily Caffeine Use: 2 daily   Review of Systems  All systems reviewed and negative except as per HPI.  Current Outpatient Prescriptions  Medication Sig Dispense Refill  . albuterol (PROAIR HFA) 108 (90 BASE) MCG/ACT  inhaler Inhale 2 puffs into the lungs every 6 (six) hours as needed for wheezing or shortness of breath.  6.7 g  2  . amitriptyline (ELAVIL) 150 MG tablet Take 1 tablet (150 mg total) by mouth at bedtime.  30 tablet  0  . atorvastatin (LIPITOR) 40 MG tablet Take 1 tablet (40 mg total) by mouth daily.  30 tablet  11  . carvedilol (COREG) 12.5 MG tablet TAKE ONE & ONE-HALF TABLETS BY MOUTH TWICE DAILY  90 tablet  1  . diclofenac sodium (VOLTAREN) 1 % GEL Apply 2 g topically 4 (four) times daily.  100 g  2  . febuxostat (ULORIC) 40 MG tablet Take 80 mg by mouth daily.      . ferrous sulfate 325 (65 FE) MG tablet Take 1 tablet (325 mg total) by mouth 3 (three) times daily with meals.  90 tablet  3  . gabapentin (NEURONTIN) 100 MG capsule Take 1 capsule (100 mg total) by mouth 3 (three) times daily.  90 capsule  0  . glucose blood (FREESTYLE LITE) test strip Use as instructed  100 each  12  . hydrALAZINE (APRESOLINE) 50 MG tablet Take 1 tablet (50 mg total) by mouth 3 (three) times daily.  90 tablet  6  . HYDROcodone-acetaminophen (NORCO) 10-325 MG per tablet Take 1 tablet by mouth every 6 (six) hours as needed for severe pain.  120 tablet  0  . Insulin Detemir (LEVEMIR) 100 UNIT/ML Pen Inject 50 Units into the skin daily at 10 pm.  45 mL  1  . isosorbide mononitrate (IMDUR) 60 MG 24 hr tablet Take 90 mg by mouth daily.      . Magnesium Oxide 400 MG CAPS Take 1 capsule (400 mg total) by mouth 2 (two) times daily.  4 capsule  0  . nystatin (MYCOSTATIN/NYSTOP) 100000 UNIT/GM POWD Apply 1 Bottle topically 2 (two) times daily.  30 g  0  . omeprazole (PRILOSEC) 40 MG capsule Take 1 capsule (40 mg total) by mouth daily.  30 capsule  3  . potassium chloride SA (K-DUR,KLOR-CON) 20 MEQ tablet Take 40 mEq by mouth 2 (two) times daily.       . predniSONE (DELTASONE) 20 MG tablet Take 1 tablet (20 mg total) by mouth daily.  3 tablet  0  . Rivaroxaban (XARELTO) 20 MG TABS tablet Take 20 mg by mouth every morning.       Marland Kitchen spironolactone (ALDACTONE) 25 MG tablet Take 25 mg by mouth daily.      Marland Kitchen torsemide (DEMADEX) 100 MG tablet Take 1 tablet (100 mg total) by mouth 2 (two) times daily.  180 tablet  3  . [DISCONTINUED] QUEtiapine (SEROQUEL) 100 MG tablet Take 400 mg by mouth at bedtime.        No current facility-administered medications for this encounter.   Filed Vitals:   12/09/13 1542  BP: 90/62  Pulse: 99  Weight: 360 lb 12.8 oz (163.658 kg)  SpO2: 99%   General: NAD, obese. Sitting in WC Neck: Thick neck, JVP difficult  to assess d/t body habitus but appears ok, no thyromegaly or thyroid nodule.  Lungs:Clear  CV: Nondisplaced PMI.  Heart regular S1/S2, no S3/S4, no murmur.   No carotid bruit.  Normal pedal pulses.  Abdomen: Soft, markedly obese. Nontender, mild distention.  Neurologic: Alert and oriented x 3. Moves all 4 without difficulty Psych: Normal affect. Extremities: No clubbing or cyanosis. Tr-1+ bilateral edema  Assessment/Plan  1. Chronic diastolic CHF: NICM, EF 50-55%. Suspect that she has significant RV failure. St Jude ICD.  - NYHA class II-III symptoms (chronic, stable).  - metolazone recently stopped due to ARF -I think she previously was dry and we pushed dry weigh down too much. Will recehck BMET today. If renal function not better will cut back torsemide. If better will continue current regimen. Take metolazone only prn for weight 366 or greater. Watch ice intake.   - Continue current Coreg, imdur, hydralazine and spironolactone - She is not on Ace or Arb due to elevated creatinine - Reinforced the need and importance of daily weights, a low sodium diet, and fluid restriction (less than 2 L a day). Instructed to call the HF clinic if weight increases more than 3 lbs overnight or 5 lbs in a week.  - Continue paramedicine 2. A/CKD: repeat BMET  3. Morbid obesity: Continue to work on weight loss and congratulated her on success so far. On waiting list for gastric bypass. Per Dr  Shirlee Latch ok for  surgery.   4. DVT: Suspected DVT 11/14 admission.  She is on Xarelto, would probably continue long-term given her inactivity.  5. Mitral valve repair: Stable on 1/15 echo.  6. Uncontrolled DM - Last Hgb A1C down from 13.0 to 7.6. Congratulated patient on success and encouraged to continue to take medications as prescribed, watch her carb intake and to exercise.  7. Hypomagnesemia - Checked during hospitalization and was 1.4. Started on Mag oxide 400 mg BID. Continue and will need to recheck next visit.  8. Acute bronchitis - will give Doxy 100 bid x 7 days bronchitis.    Arvilla Meres MD  4:05 PM

## 2013-12-11 ENCOUNTER — Telehealth: Payer: Self-pay | Admitting: *Deleted

## 2013-12-11 DIAGNOSIS — J209 Acute bronchitis, unspecified: Secondary | ICD-10-CM | POA: Insufficient documentation

## 2013-12-11 DIAGNOSIS — N189 Chronic kidney disease, unspecified: Secondary | ICD-10-CM

## 2013-12-11 DIAGNOSIS — N179 Acute kidney failure, unspecified: Secondary | ICD-10-CM | POA: Insufficient documentation

## 2013-12-11 NOTE — Telephone Encounter (Signed)
Pt returned call, appt made for 7/30 at 1445 dr boggala

## 2013-12-11 NOTE — Telephone Encounter (Signed)
Agree with need for eval in [facility]

## 2013-12-11 NOTE — Telephone Encounter (Signed)
Pt's [name] calls and states she called [name] this am and stated she had 2 falls during the night with some residual tenderness in R neck/ shoulder. States she called EMS and they told her she did not meet criteria for transport to ED. Pt states her blood sugar was 126 this am when EMS checked. States she had a 2 liter regular ginger ale yesterday plus some canned sodas also eating dill pickles. She states she is being treated with doxy for bronchitis but is becoming more congested. Attempted to call pt and no answer- appt this pm or tomorrow

## 2013-12-12 ENCOUNTER — Encounter: Payer: Self-pay | Admitting: Internal Medicine

## 2013-12-12 ENCOUNTER — Ambulatory Visit (INDEPENDENT_AMBULATORY_CARE_PROVIDER_SITE_OTHER): Payer: Medicare Other | Admitting: Internal Medicine

## 2013-12-12 VITALS — BP 107/74 | HR 90 | Temp 97.6°F

## 2013-12-12 DIAGNOSIS — IMO0002 Reserved for concepts with insufficient information to code with codable children: Secondary | ICD-10-CM

## 2013-12-12 DIAGNOSIS — E1129 Type 2 diabetes mellitus with other diabetic kidney complication: Secondary | ICD-10-CM

## 2013-12-12 DIAGNOSIS — W19XXXA Unspecified fall, initial encounter: Secondary | ICD-10-CM | POA: Insufficient documentation

## 2013-12-12 DIAGNOSIS — E1165 Type 2 diabetes mellitus with hyperglycemia: Secondary | ICD-10-CM

## 2013-12-12 LAB — GLUCOSE, CAPILLARY: Glucose-Capillary: 103 mg/dL — ABNORMAL HIGH (ref 70–99)

## 2013-12-12 NOTE — Assessment & Plan Note (Addendum)
History of fall on 12/11/13 at 4 am and 6 am. Suspect the fall secondary from Micturition ---> Vagus nerve triggering ----> bradycardia and hypotension ----> fall. As the patient is urinating on the bed, suspect her vasovagal response is triggered while she is on the bed, leading to a fall in the bathroom before she even starts urinating. Discussed with the attending regarding further management.  Plans: Avoid urinating while asleep.  Sit while urinating. Sit on the edge of the bed for a few minutes before getting up and going to the toilet. Continue current medications. Educated patient about vasovagal response and how to prevent falls.

## 2013-12-12 NOTE — Progress Notes (Signed)
Subjective:   Patient ID: [name] female   DOB: Jul 18, 1960 53 y.o.   MRN: [id number]  HPI: [name] is a 53 y.o. woman with PMH as mentioned below comes to the office with CC fall x 2 yesterday.  Patient reports that she woke up yesterday at 4 AM to go to the bathroom and she fell in the bathroom. She had another similar fall in the bathroom at 6 AM. She reports that she usually pee on the bed and when she got up to go to the bathroom, she felt slightly dizzy and fell. She denies losing consciousness, chest pain, sob, palpitations, sweating. She denies any similar symptoms in the past. She denies any dysuria, abdominal pain, N/V, fever, chills. Her family apparently called the EMS after the 6AM fall and when the EMS arrived she was told that her BP was "low" but not low enough to go to the ED.  She denies any other complaints.  Past Medical History  Diagnosis Date  . Severe mitral regurgitation     a. Minimally invasive MV repair on 12/18/08. Additionally TV repair  and PFO closure.  . Chronic systolic CHF (congestive heart failure)     a. 12/2011 Echo: EF 30%, diff HK, mild LVH, Gr 1 DD, mildly dil LA. b. 2/2 NICM.  Marland Kitchen Microcytic anemia     a. Small capsule endoscopy 2012 with gastric erosion and small colonic AVMs. b. EGD and colonoscopy before that in 12/11 did not reveal source of bleeding)  . Gastric ulcer   . NICM (nonischemic cardiomyopathy)     a.12/2011: s/p SJM [id number] single lead ICD, Ser #: [id number]  . Obstructive sleep apnea     on CPAP  . GERD (gastroesophageal reflux disease)   . HTN (hypertension)   . Gout   . Depression   . Colitis 2/11  . Chronic back pain   . Chronic knee pain   . DVT (deep venous thrombosis)     right leg  . Anxiety   . Rheumatoid arthritis(714.0)     "shoulders & knees"  . Sleep disturbance     07/14/11 "haven't slept in 10 months; since going to [facility]"  . Hx: UTI (urinary tract infection)   . Nocturia   . COPD (chronic  obstructive pulmonary disease)   . Bronchitis   . PTSD (post-traumatic stress disorder)   . Schizophrenia   . Dysmenorrhea   . Morbid obesity   . Acute renal insufficiency     a. ?Cardiorenal 09/2012.  Marland Kitchen Hyperglycemia   . Automatic implantable cardioverter-defibrillator in situ   . Chronic diastolic HF (heart failure)     a. ECHO (05/2013) EF 50-55%, RV dilated and KH   Current Outpatient Prescriptions  Medication Sig Dispense Refill  . albuterol (PROAIR HFA) 108 (90 BASE) MCG/ACT inhaler Inhale 2 puffs into the lungs every 6 (six) hours as needed for wheezing or shortness of breath.  6.7 g  2  . amitriptyline (ELAVIL) 150 MG tablet Take 1 tablet (150 mg total) by mouth at bedtime.  30 tablet  0  . atorvastatin (LIPITOR) 40 MG tablet Take 1 tablet (40 mg total) by mouth daily.  30 tablet  11  . carvedilol (COREG) 12.5 MG tablet TAKE ONE & ONE-HALF TABLETS BY MOUTH TWICE DAILY  90 tablet  1  . diclofenac sodium (VOLTAREN) 1 % GEL Apply 2 g topically 4 (four) times daily.  100 g  2  . doxycycline (VIBRAMYCIN) 50 MG  capsule Take 2 capsules (100 mg total) by mouth 2 (two) times daily. X 7 days  28 capsule  0  . febuxostat (ULORIC) 40 MG tablet Take 80 mg by mouth daily.      . ferrous sulfate 325 (65 FE) MG tablet Take 1 tablet (325 mg total) by mouth 3 (three) times daily with meals.  90 tablet  3  . gabapentin (NEURONTIN) 100 MG capsule Take 1 capsule (100 mg total) by mouth 3 (three) times daily.  90 capsule  0  . glucose blood (FREESTYLE LITE) test strip Use as instructed  100 each  12  . hydrALAZINE (APRESOLINE) 50 MG tablet Take 1 tablet (50 mg total) by mouth 3 (three) times daily.  90 tablet  6  . HYDROcodone-acetaminophen (NORCO) 10-325 MG per tablet Take 1 tablet by mouth every 6 (six) hours as needed for severe pain.  120 tablet  0  . Insulin Detemir (LEVEMIR) 100 UNIT/ML Pen Inject 50 Units into the skin daily at 10 pm.  45 mL  1  . isosorbide mononitrate (IMDUR) 60 MG 24 hr tablet  Take 90 mg by mouth daily.      . Magnesium Oxide 400 MG CAPS Take 1 capsule (400 mg total) by mouth 2 (two) times daily.  4 capsule  0  . nystatin (MYCOSTATIN/NYSTOP) 100000 UNIT/GM POWD Apply 1 Bottle topically 2 (two) times daily.  30 g  0  . omeprazole (PRILOSEC) 40 MG capsule Take 1 capsule (40 mg total) by mouth daily.  30 capsule  3  . potassium chloride SA (K-DUR,KLOR-CON) 20 MEQ tablet Take 40 mEq by mouth 2 (two) times daily.       . predniSONE (DELTASONE) 20 MG tablet Take 1 tablet (20 mg total) by mouth daily.  3 tablet  0  . Rivaroxaban (XARELTO) 20 MG TABS tablet Take 20 mg by mouth every morning.      Marland Kitchen spironolactone (ALDACTONE) 25 MG tablet Take 25 mg by mouth daily.      Marland Kitchen torsemide (DEMADEX) 100 MG tablet Take 1 tablet (100 mg total) by mouth 2 (two) times daily.  180 tablet  3  . [DISCONTINUED] QUEtiapine (SEROQUEL) 100 MG tablet Take 400 mg by mouth at bedtime.        No current facility-administered medications for this visit.   Family History  Problem Relation Age of Onset  . Hypertension Sister   . Alcoholism Father     died in his 70's or 59's  . Other Mother     alive & well @ 55.   History   Social History  . Marital Status: Single    Spouse Name: N/A    Number of Children: N/A  . Years of Education: N/A   Occupational History  . disability    Social History Main Topics  . Smoking status: Former Smoker -- 0.50 packs/day for 27 years    Types: Cigarettes    Quit date: 12/18/2008  . Smokeless tobacco: Never Used  . Alcohol Use: No     Comment: "stopped drinking alcohol 12/2003; did drink ~ 6 shots/wk"  . Drug Use: No     Comment: "last cocaine use 2009"  . Sexual Activity: No   Other Topics Concern  . None   Social History Narrative   Pt lives in [place] with her dtr.  On disability.   Review of Systems: Pertinent items are noted in HPI. Objective:  Physical Exam: Filed Vitals:   12/12/13 1505  BP: 107/74  Lyons: 90  Temp: 97.6 F (36.4 C)   TempSrc: Oral  SpO2: 96%   Constitutional: Vital signs reviewed.  Patient is a well-developed and well-nourished and is in no acute distress and cooperative with exam. Sitting a wheel chair.  Cardiovascular: RRR, S1 normal, S2 normal.  Pulmonary/Chest: normal respiratory effort, CTAB, no wheezes, rales, or rhonchi  Musculoskeletal: No joint deformities, erythema, or stiffness, ROM full and no nontender  Neurological: A&O x 3  Extremities: 1+pitting edema in both legs from ankles to mid leg. Skin: Warm, dry and intact. No rash, cyanosis, or clubbing.  Psychiatric: Normal mood and affect.   Assessment & Plan:

## 2013-12-12 NOTE — Patient Instructions (Addendum)
Avoid urinating while asleep.  Sit while urinating. Sit on the edge of the bed for a few minutes before getting up and going to the toilet. Take all the medications as recommended below. Follow up with heart failure clinic as scheduled.

## 2013-12-13 NOTE — Progress Notes (Signed)
INTERNAL MEDICINE TEACHING ATTENDING ADDENDUM - Chrislyn Seedorf, MD: I reviewed and discussed at the time of visit with the resident Dr. Boggala, the patient's medical history, physical examination, diagnosis and results of pertinent tests and treatment and I agree with the patient's care as documented. 

## 2013-12-14 ENCOUNTER — Other Ambulatory Visit: Payer: Self-pay | Admitting: Cardiology

## 2013-12-20 ENCOUNTER — Other Ambulatory Visit: Payer: Self-pay | Admitting: *Deleted

## 2013-12-20 ENCOUNTER — Other Ambulatory Visit: Payer: Self-pay | Admitting: Internal Medicine

## 2013-12-20 MED ORDER — AMITRIPTYLINE HCL 150 MG PO TABS: 150.0000 mg | ORAL_TABLET | Freq: Every day | ORAL | Status: AC

## 2013-12-24 ENCOUNTER — Telehealth (HOSPITAL_COMMUNITY): Payer: Self-pay | Admitting: Vascular Surgery

## 2013-12-24 NOTE — Telephone Encounter (Signed)
PT CALLED... Pt saw Dr. Jesusita Oka a week ago he prescribed anitibiotic for pt cough pt states cough has gotten worse... please advise

## 2013-12-24 NOTE — Telephone Encounter (Signed)
Spoke w/pt she states wt is stable at 357 today, she took the antibiotic as prescribed but feels it has not helped any, advised pt should f/u with pcp she is agreeable

## 2013-12-25 ENCOUNTER — Encounter: Payer: Self-pay | Admitting: Internal Medicine

## 2013-12-25 ENCOUNTER — Ambulatory Visit (INDEPENDENT_AMBULATORY_CARE_PROVIDER_SITE_OTHER): Payer: Medicare Other | Admitting: Internal Medicine

## 2013-12-25 DIAGNOSIS — J209 Acute bronchitis, unspecified: Secondary | ICD-10-CM

## 2013-12-25 DIAGNOSIS — J208 Acute bronchitis due to other specified organisms: Secondary | ICD-10-CM

## 2013-12-25 NOTE — Progress Notes (Signed)
Patient ID: [name], female   DOB: 1960-12-15, 53 y.o.   MRN: [id number]    Subjective:   Patient ID: [name] female    DOB: [birth year] 53 y.o.    MRN: [id number] Health Maintenance Due: Health Maintenance Due  Topic Date Due  . Foot Exam  05/22/1971  . Urine Microalbumin  05/22/1971  . Tetanus/tdap  05/21/1980  . Influenza Vaccine  12/14/2013    ________________________________________________________________  HPI: [name] is a 53 y.o. female here for an acute visit for a 2-3 week cough.  Pt has a PMH outlined below.  Please see problem-based charting assessment and plan note for further details of medical issues addressed at today's visit.  PMH: Past Medical History  Diagnosis Date  . Severe mitral regurgitation     a. Minimally invasive MV repair on 12/18/08. Additionally TV repair  and PFO closure.  . Chronic systolic CHF (congestive heart failure)     a. 12/2011 Echo: EF 30%, diff HK, mild LVH, Gr 1 DD, mildly dil LA. b. 2/2 NICM.  Marland Kitchen Microcytic anemia     a. Small capsule endoscopy 2012 with gastric erosion and small colonic AVMs. b. EGD and colonoscopy before that in 12/11 did not reveal source of bleeding)  . Gastric ulcer   . NICM (nonischemic cardiomyopathy)     a.12/2011: s/p SJM [id number] single lead ICD, Ser #: [id number]  . Obstructive sleep apnea     on CPAP  . GERD (gastroesophageal reflux disease)   . HTN (hypertension)   . Gout   . Depression   . Colitis 2/11  . Chronic back pain   . Chronic knee pain   . DVT (deep venous thrombosis)     right leg  . Anxiety   . Rheumatoid arthritis(714.0)     "shoulders & knees"  . Sleep disturbance     07/14/11 "haven't slept in 10 months; since going to [facility]"  . Hx: UTI (urinary tract infection)   . Nocturia   . COPD (chronic obstructive pulmonary disease)   . Bronchitis   . PTSD (post-traumatic stress disorder)   . Schizophrenia   . Dysmenorrhea   . Morbid obesity   . Acute renal  insufficiency     a. ?Cardiorenal 09/2012.  Marland Kitchen Hyperglycemia   . Automatic implantable cardioverter-defibrillator in situ   . Chronic diastolic HF (heart failure)     a. ECHO (05/2013) EF 50-55%, RV dilated and KH    Medications: Current Outpatient Prescriptions on File Prior to Visit  Medication Sig Dispense Refill  . albuterol (PROAIR HFA) 108 (90 BASE) MCG/ACT inhaler Inhale 2 puffs into the lungs every 6 (six) hours as needed for wheezing or shortness of breath.  6.7 g  2  . amitriptyline (ELAVIL) 150 MG tablet Take 1 tablet (150 mg total) by mouth at bedtime.  30 tablet  5  . atorvastatin (LIPITOR) 40 MG tablet Take 1 tablet (40 mg total) by mouth daily.  30 tablet  11  . carvedilol (COREG) 12.5 MG tablet TAKE ONE & ONE-HALF TABLETS BY MOUTH TWICE DAILY  90 tablet  1  . diclofenac sodium (VOLTAREN) 1 % GEL Apply 2 g topically 4 (four) times daily.  100 g  2  . doxycycline (VIBRAMYCIN) 50 MG capsule Take 2 capsules (100 mg total) by mouth 2 (two) times daily. X 7 days  28 capsule  0  . febuxostat (ULORIC) 40 MG tablet Take 80 mg by mouth daily.      Marland Kitchen  ferrous sulfate 325 (65 FE) MG tablet Take 1 tablet (325 mg total) by mouth 3 (three) times daily with meals.  90 tablet  3  . gabapentin (NEURONTIN) 100 MG capsule Take 1 capsule (100 mg total) by mouth 3 (three) times daily.  90 capsule  0  . glucose blood (FREESTYLE LITE) test strip Use as instructed  100 each  12  . hydrALAZINE (APRESOLINE) 50 MG tablet TAKE ONE TABLET BY MOUTH THREE TIMES DAILY  90 tablet  0  . HYDROcodone-acetaminophen (NORCO) 10-325 MG per tablet Take 1 tablet by mouth every 6 (six) hours as needed for severe pain.  120 tablet  0  . Insulin Detemir (LEVEMIR) 100 UNIT/ML Pen Inject 50 Units into the skin daily at 10 pm.  45 mL  1  . isosorbide mononitrate (IMDUR) 60 MG 24 hr tablet Take 90 mg by mouth daily.      . Magnesium Oxide 400 MG CAPS Take 1 capsule (400 mg total) by mouth 2 (two) times daily.  4 capsule  0  .  nystatin (MYCOSTATIN/NYSTOP) 100000 UNIT/GM POWD Apply 1 Bottle topically 2 (two) times daily.  30 g  0  . omeprazole (PRILOSEC) 40 MG capsule Take 1 capsule (40 mg total) by mouth daily.  30 capsule  3  . potassium chloride SA (K-DUR,KLOR-CON) 20 MEQ tablet Take 40 mEq by mouth 2 (two) times daily.       . predniSONE (DELTASONE) 20 MG tablet Take 1 tablet (20 mg total) by mouth daily.  3 tablet  0  . Rivaroxaban (XARELTO) 20 MG TABS tablet Take 20 mg by mouth every morning.      Marland Kitchen spironolactone (ALDACTONE) 25 MG tablet Take 25 mg by mouth daily.      Marland Kitchen torsemide (DEMADEX) 100 MG tablet Take 1 tablet (100 mg total) by mouth 2 (two) times daily.  180 tablet  3  . [DISCONTINUED] QUEtiapine (SEROQUEL) 100 MG tablet Take 400 mg by mouth at bedtime.        No current facility-administered medications on file prior to visit.    Allergies: Allergies  Allergen Reactions  . Risperidone Other (See Comments)    Auditory hallucinations.  . Colchicine Hives and Itching  . Morphine Itching  . Shrimp [Shellfish Allergy] Hives and Itching    FH: Family History  Problem Relation Age of Onset  . Hypertension Sister   . Alcoholism Father     died in his 38's or 36's  . Other Mother     alive & well @ 11.    SH: History   Social History  . Marital Status: Single    Spouse Name: N/A    Number of Children: N/A  . Years of Education: N/A   Occupational History  . disability    Social History Main Topics  . Smoking status: Former Smoker -- 0.50 packs/day for 27 years    Types: Cigarettes    Quit date: 12/18/2008  . Smokeless tobacco: Never Used  . Alcohol Use: No     Comment: "stopped drinking alcohol 12/2003; did drink ~ 6 shots/wk"  . Drug Use: No     Comment: "last cocaine use 2009"  . Sexual Activity: No   Other Topics Concern  . Not on file   Social History Narrative   Pt lives in [place] with her dtr.  On disability.    Review of Systems: Constitutional: Negative for fever,  chills and weight loss.  Eyes: Negative for blurred vision.  Respiratory: Negative for cough and shortness of breath.  Cardiovascular: Negative for chest pain, palpitations and leg swelling.  Gastrointestinal: Negative for nausea, vomiting, abdominal pain, diarrhea, constipation and blood in stool.  Genitourinary: Negative for dysuria, urgency and frequency.  Musculoskeletal: Negative for myalgias and back pain.  Neurological: Negative for dizziness, weakness and headaches.     Objective:   Vital Signs: There were no vitals filed for this visit.    BP Readings from Last 3 Encounters:  12/12/13 107/74  12/09/13 90/62  12/04/13 121/85    Physical Exam: Constitutional: Vital signs reviewed.  Patient is well-developed and well-nourished in NAD and cooperative with exam.  Head: Normocephalic and atraumatic. Eyes: PERRL, EOMI, conjunctivae nl, no scleral icterus.  Neck: Supple. Cardiovascular: RRR, no MRG. Pulmonary/Chest: normal effort, non-tender to palpation, CTAB, no wheezes, rales, or rhonchi. Abdominal: Obese. Soft. NT/ND +BS. Neurological: A&O x3, cranial nerves II-XII are grossly intact, moving all extremities. Extremities: 2+DP b/l; trace pitting edema b/l.  Skin: Warm, dry and intact. No rash.  Most Recent Laboratory Results:  CMP     Component Value Date/Time   NA 140 12/09/2013 1625   K 3.8 12/09/2013 1625   CL 96 12/09/2013 1625   CO2 33* 12/09/2013 1625   GLUCOSE 111* 12/09/2013 1625   BUN 45* 12/09/2013 1625   CREATININE 1.64* 12/09/2013 1625   CREATININE 2.34* 12/04/2013 1648   CALCIUM 9.4 12/09/2013 1625   PROT 8.0 11/21/2013 2125   ALBUMIN 3.1* 11/21/2013 2125   AST 20 11/21/2013 2125   ALT 43* 11/21/2013 2125   ALKPHOS 129* 11/21/2013 2125   BILITOT 0.3 11/21/2013 2125   GFRNONAA 35* 12/09/2013 1625   GFRNONAA 23* 12/02/2013 1119   GFRAA 41* 12/09/2013 1625   GFRAA 27* 12/02/2013 1119    CBC    Component Value Date/Time   WBC 5.9 11/27/2013 1558   RBC 4.84 11/27/2013  1558   RBC 5.19* 11/02/2008 0500   HGB 12.5 11/27/2013 1558   HCT 37.8 11/27/2013 1558   PLT 268 11/27/2013 1558   MCV 78.1 11/27/2013 1558   MCH 25.8* 11/27/2013 1558   MCHC 33.1 11/27/2013 1558   RDW 17.9* 11/27/2013 1558   LYMPHSABS 0.9 08/19/2013 1340   MONOABS 0.4 08/19/2013 1340   EOSABS 0.2 08/19/2013 1340   BASOSABS 0.0 08/19/2013 1340    Lipid Panel Lab Results  Component Value Date   CHOL 180 09/30/2013   HDL 35* 09/30/2013   LDLCALC 123* 09/30/2013   TRIG 109 09/30/2013   CHOLHDL 5.1 09/30/2013    HA1C Lab Results  Component Value Date   HGBA1C 7.6* 11/22/2013    Urinalysis    Component Value Date/Time   COLORURINE YELLOW 12/02/2013 1119   APPEARANCEUR CLOUDY* 12/02/2013 1119   LABSPEC 1.009 12/02/2013 1119   PHURINE 7.0 12/02/2013 1119   GLUCOSEU NEG 12/02/2013 1119   HGBUR NEG 12/02/2013 1119   BILIRUBINUR NEG 12/02/2013 1119   BILIRUBINUR Negative 11/07/2013 1651   KETONESUR NEG 12/02/2013 1119   PROTEINUR NEG 12/02/2013 1119   PROTEINUR Negative 11/07/2013 1651   UROBILINOGEN 0.2 12/02/2013 1119   UROBILINOGEN 0.2 11/07/2013 1651   NITRITE NEG 12/02/2013 1119   NITRITE Negative 11/07/2013 1651   LEUKOCYTESUR SMALL* 12/02/2013 1119    Urine Microalbumin No results found for this basename: MICROALBUR,  [id number]    Imaging N/A   Assessment & Plan:   Assessment and plan was discussed and formulated with my attending.  Patient should return to the [facility] on  8/24 for follow up of cough and other chronic medical issues.

## 2013-12-25 NOTE — Assessment & Plan Note (Addendum)
Pt comes in for acute visit for productive cough of brownish-yellowish sputum that has been going on for 2-3 weeks.  She has Dr. Milas Kocher on 7/27 and was given doxycycline for 7 days without relief.  She describes the cough as worse in the morning and during the evening.  No fever/chills, URI symptoms, SOB, CP, PND, orthopnea, abdominal pain, N/V.  She is a previous smoker that quit in 2010 prior to smoking since ~53 yo.  She is not on any meds that may cause chronic cough--no ACEi and no new meds.  No post-nasal drip.  Has never had PFTs so no known COPD although she has an inhaler that she has been using more frequently for cough.  Denies any h/o allergies.  No pets in the home or any exposure to new allergens (no new perfums, etc.).  Does have a h/o GERD which she reports compliance with prilosec 40mg  every AM before breakfast.  Lungs are clear on exam--no crackles, wheezing, rhonchi. O2 saturation wnl.  She does have a h/o heart failure but does not appear volume overloaded on exam and is compliant with meds.  CXR in April 2015 shows right lung scarring but otherwise no acute abnormalities.  Given her already heavy medication burden, I would not provide any medications for cough but treat symptomatically.  Likely etiology viral bronchitis vs. GERD.  -lozenges as needed for cough -continue prilosec in AM -follow-up with PCP on 8/24

## 2013-12-25 NOTE — Patient Instructions (Signed)
Thank you for your visit today.   Please return to the internal medicine clinic on 01/06/14 or sooner if needed.     Your current medical regimen is effective;  continue present plan and take all medications as prescribed.    You may try cough drops for relief of your cough.  Please see other instructions below.   Please be sure to bring all of your medications with you to every visit; this includes herbal supplements, vitamins, eye drops, and any over-the-counter medications.   Should you have any questions regarding your medications and/or any new or worsening symptoms, please be sure to call the clinic at [phone].   If you believe that you are suffering from a life threatening condition or one that may result in the loss of limb or function, then you should call 911 or proceed to the nearest Emergency Department.    Cough, Adult  A cough is a reflex that helps clear your throat and airways. It can help heal the body or may be a reaction to an irritated airway. A cough may only last 2 or 3 weeks (acute) or may last more than 8 weeks (chronic).  CAUSES Acute cough:  Viral or bacterial infections. Chronic cough:  Infections.  Allergies.  Asthma.  Post-nasal drip.  Smoking.  Heartburn or acid reflux.  Some medicines.  Chronic lung problems (COPD).  Cancer. SYMPTOMS   Cough.  Fever.  Chest pain.  Increased breathing rate.  High-pitched whistling sound when breathing (wheezing).  Colored mucus that you cough up (sputum). TREATMENT   A bacterial cough may be treated with antibiotic medicine.  A viral cough must run its course and will not respond to antibiotics.  Your caregiver may recommend other treatments if you have a chronic cough. HOME CARE INSTRUCTIONS   Only take over-the-counter or prescription medicines for pain, discomfort, or fever as directed by your caregiver. Use cough suppressants only as directed by your caregiver.  Use a cold steam  vaporizer or humidifier in your bedroom or home to help loosen secretions.  Sleep in a semi-upright position if your cough is worse at night.  Rest as needed.  Stop smoking if you smoke. SEEK IMMEDIATE MEDICAL CARE IF:   You have pus in your sputum.  Your cough starts to worsen.  You cannot control your cough with suppressants and are losing sleep.  You begin coughing up blood.  You have difficulty breathing.  You develop pain which is getting worse or is uncontrolled with medicine.  You have a fever. MAKE SURE YOU:   Understand these instructions.  Will watch your condition.  Will get help right away if you are not doing well or get worse. Document Released: 10/29/2010 Document Revised: 07/25/2011 Document Reviewed: 10/29/2010 [facility] Patient Information 2015 [place], [place]. This information is not intended to replace advice given to you by your health care provider. Make sure you discuss any questions you have with your health care provider.     A healthy lifestyle and preventative care can promote health and wellness.   Maintain regular health, dental, and eye exams.  Eat a healthy diet. Foods like vegetables, fruits, whole grains, low-fat dairy products, and lean protein foods contain the nutrients you need without too many calories. Decrease your intake of foods high in solid fats, added sugars, and salt. Get information about a proper diet from your caregiver, if necessary.  Regular physical exercise is one of the most important things you can do for your health.  Most adults should get at least 150 minutes of moderate-intensity exercise (any activity that increases your heart rate and causes you to sweat) each week. In addition, most adults need muscle-strengthening exercises on 2 or more days a week.   Maintain a healthy weight. The body mass index (BMI) is a screening tool to identify possible weight problems. It provides an estimate of body fat based on height  and weight. Your caregiver can help determine your BMI, and can help you achieve or maintain a healthy weight. For adults 20 years and older:  A BMI below 18.5 is considered underweight.  A BMI of 18.5 to 24.9 is normal.  A BMI of 25 to 29.9 is considered overweight.  A BMI of 30 and above is considered obese.

## 2013-12-26 ENCOUNTER — Ambulatory Visit (HOSPITAL_COMMUNITY)
Admission: RE | Admit: 2013-12-26 | Discharge: 2013-12-26 | Disposition: A | Discharge status: Home / Self Care | Payer: Medicare Other | Source: Ambulatory Visit | Attending: Internal Medicine | Admitting: Internal Medicine

## 2013-12-26 VITALS — BP 118/68 | HR 94 | Wt 357.0 lb

## 2013-12-26 DIAGNOSIS — R55 Syncope and collapse: Secondary | ICD-10-CM | POA: Insufficient documentation

## 2013-12-26 DIAGNOSIS — G4733 Obstructive sleep apnea (adult) (pediatric): Secondary | ICD-10-CM | POA: Diagnosis not present

## 2013-12-26 DIAGNOSIS — R059 Cough, unspecified: Secondary | ICD-10-CM | POA: Diagnosis not present

## 2013-12-26 DIAGNOSIS — M069 Rheumatoid arthritis, unspecified: Secondary | ICD-10-CM | POA: Insufficient documentation

## 2013-12-26 DIAGNOSIS — F3289 Other specified depressive episodes: Secondary | ICD-10-CM | POA: Insufficient documentation

## 2013-12-26 DIAGNOSIS — I129 Hypertensive chronic kidney disease with stage 1 through stage 4 chronic kidney disease, or unspecified chronic kidney disease: Secondary | ICD-10-CM | POA: Insufficient documentation

## 2013-12-26 DIAGNOSIS — K219 Gastro-esophageal reflux disease without esophagitis: Secondary | ICD-10-CM | POA: Insufficient documentation

## 2013-12-26 DIAGNOSIS — Z86718 Personal history of other venous thrombosis and embolism: Secondary | ICD-10-CM | POA: Diagnosis not present

## 2013-12-26 DIAGNOSIS — Z885 Allergy status to narcotic agent status: Secondary | ICD-10-CM | POA: Diagnosis not present

## 2013-12-26 DIAGNOSIS — Z794 Long term (current) use of insulin: Secondary | ICD-10-CM | POA: Insufficient documentation

## 2013-12-26 DIAGNOSIS — F329 Major depressive disorder, single episode, unspecified: Secondary | ICD-10-CM | POA: Diagnosis not present

## 2013-12-26 DIAGNOSIS — Z888 Allergy status to other drugs, medicaments and biological substances status: Secondary | ICD-10-CM | POA: Diagnosis not present

## 2013-12-26 DIAGNOSIS — I5032 Chronic diastolic (congestive) heart failure: Secondary | ICD-10-CM | POA: Insufficient documentation

## 2013-12-26 DIAGNOSIS — E1165 Type 2 diabetes mellitus with hyperglycemia: Secondary | ICD-10-CM | POA: Diagnosis not present

## 2013-12-26 DIAGNOSIS — Z87891 Personal history of nicotine dependence: Secondary | ICD-10-CM | POA: Insufficient documentation

## 2013-12-26 DIAGNOSIS — N179 Acute kidney failure, unspecified: Secondary | ICD-10-CM

## 2013-12-26 DIAGNOSIS — K279 Peptic ulcer, site unspecified, unspecified as acute or chronic, without hemorrhage or perforation: Secondary | ICD-10-CM | POA: Diagnosis not present

## 2013-12-26 DIAGNOSIS — R05 Cough: Secondary | ICD-10-CM | POA: Diagnosis not present

## 2013-12-26 DIAGNOSIS — D509 Iron deficiency anemia, unspecified: Secondary | ICD-10-CM | POA: Diagnosis not present

## 2013-12-26 DIAGNOSIS — IMO0002 Reserved for concepts with insufficient information to code with codable children: Secondary | ICD-10-CM | POA: Diagnosis not present

## 2013-12-26 DIAGNOSIS — E118 Type 2 diabetes mellitus with unspecified complications: Secondary | ICD-10-CM

## 2013-12-26 DIAGNOSIS — I509 Heart failure, unspecified: Secondary | ICD-10-CM | POA: Diagnosis not present

## 2013-12-26 DIAGNOSIS — F209 Schizophrenia, unspecified: Secondary | ICD-10-CM | POA: Diagnosis not present

## 2013-12-26 DIAGNOSIS — N189 Chronic kidney disease, unspecified: Secondary | ICD-10-CM | POA: Diagnosis not present

## 2013-12-26 LAB — BASIC METABOLIC PANEL
Anion gap: 12 (ref 5–15)
BUN: 42 mg/dL — AB (ref 6–23)
CO2: 35 mEq/L — ABNORMAL HIGH (ref 19–32)
Calcium: 9.7 mg/dL (ref 8.4–10.5)
Chloride: 92 mEq/L — ABNORMAL LOW (ref 96–112)
Creatinine, Ser: 1.73 mg/dL — ABNORMAL HIGH (ref 0.50–1.10)
GFR calc Af Amer: 38 mL/min — ABNORMAL LOW (ref >90)
GFR, EST NON AFRICAN AMERICAN: 33 mL/min — AB (ref >90)
GLUCOSE: 102 mg/dL — AB (ref 70–99)
POTASSIUM: 3.7 meq/L (ref 3.7–5.3)
Sodium: 139 mEq/L (ref 137–147)

## 2013-12-26 LAB — MAGNESIUM: Magnesium: 2 mg/dL (ref 1.5–2.5)

## 2013-12-26 NOTE — Addendum Note (Signed)
Encounter addended by: Theresia Bough, CMA on: 12/26/2013  2:57 PM<BR>     Documentation filed: Orders, Patient Instructions Section

## 2013-12-26 NOTE — Patient Instructions (Signed)
Labs today  Your physician recommends that you schedule a follow-up appointment in: 2 months  Do the following things EVERYDAY: 1) Weigh yourself in the morning before breakfast. Write it down and keep it in a log. 2) Take your medicines as prescribed 3) Eat low salt foods-Limit salt (sodium) to 2000 mg per day.  4) Stay as active as you can everyday 5) Limit all fluids for the day to less than 2 liters 6)   

## 2013-12-26 NOTE — Progress Notes (Signed)
Patient ID: [name], female   DOB: 06/16/1960, 53 y.o.   MRN: [id number]  PCP: Dr. Burtis Junes (Internal Medicine)  53 yo with CHF probably secondary to severe MR now s/p MV repair, rheumatoid arthritis, schizophrenia, overdose of seroquel 2013, S/P St Jude ICD 2013, and PUD.  EF initially low, but most recent echo in 1/15 showed EF 50-55%, stable MV repair, RV dilated and hypokinetic but difficult images. Newly diagnosed DM2, Hgb A1C 13% in April. CKD (Cr 1.4-1.7)  Follow for Heart Failure: Seen in clinic 2 weeks ago. Metolazone had been previously cut back because she was dry and renal function worse. Creatinine improved. Was started on doxy for bronchitis. Continues to follow with Paramedicine.  Weighing every day. Weight down 3 pounds since we saw her last. Denies dysnea or edema. Taking torsemide 100 bid. Off metolazone.  Saw IM yesterday for cough.    Labs (11/14): K 4.1 Creatinine 1.96  Labs (1/15): K 3.2, creatinine 2.28 Labs (2/15): K 3.6, creatinine 2.05 Labs (08/26/13): K 3.0 Creatinine 1.6 Labs (11/22/13): K 3.6, creatinine 1.0 Labs (12/04/13): K 3.4, creatinine 2.34 Labs 12/09/13): K 3.8 Cr 1.64  Allergies:  1) ! * Colchicine  2) ! Morphine   Past History:  1. Congestive heart failure, most likely secondary to severe mitral regurgitation. TEE (6/10): EF 40%, diffuse hypokinesis, mild to moderately dilated LV, severe eccentric MR, small PFO. Cardiac MRI (6/10): EF 37% with global hypokinesis, moderate to severe MR, no delayed enhancement (no evidence for sarcoidosis or other infiltrative disease). TTE (10/10) after MV repair: EF 25-30%, moderately dilated LV, moderate diastolic dysfunction, mildly depressed RV function, trivial MR, MV mean gradient 5 mmHg, PASP 30 mmHg. RHC (12/10): mean RA 19, PA 46/26, mean PCWP 28, CI 2.5, SVO2 63%. TTE (2/11): EF 35-40% with diffuse hypokinesis, no significant MR or MS. TTE (7/11): EF 45%, mild global hypokinesis, s/p MV repair, no mitral  regurgitation, minimal mitral stenosis, PA systolic pressure 40 mmHg, mild LV dilation.  TTE (11/12): EF 30-35%, mild LV dilation, mild LVH, s/p MV repair with no regurgitation and minimal stenosis.  TTE (3/13): EF 25-30%, diffuse hypokinesis, no significant mitral stenosis by pressure halftime with mean gradient 7 mmHg across valve, no MR.  Echo (6/13): EF 30-35%, mildly dilated LV, mild mitral stenosis but no MR.  St Jude ICD placed in 8/13. RHC (5/14): mean RA 10, PA 23/10, mean PCWP 17, CI 2.17.  Echo (11/14) with EF 40%, stable repaired MV, mild to moderately dilated RV with mildly decreased systolic function.  Echo (1/15) with EF 50-55%, s/p MV repair with no MS or significant MR, RV poorly visualized but appears hypokinetic and dilated.  2. Severe mitral regurgitation (see above). Minimally invasive mitral valve repair on 12/18/08. She additionally had TV repair and PFO closure.  3. Microcytic anemia: EGD and colonoscopy in 12/11 did not reveal source of bleeding.  4. H/o gastric ulcers.  5. Morbid obesity.  6. Obstructive sleep apnea, on CPAP.  7. GERD.  8. Hypertension, controlled.  9. Rheumatoid arthritis  10. Gout.  11. Depression.  12. LHC (6/10): No angiographic CAD. Mildly dilated LV. 3+ MR. EF 40%.  13. Prior smoking, quit  14. Colitis (2/11)  15. Schizophrenia versus schizoaffective disorder 16. H/o DVT: On Xarelto 17. Syncope while coughing.  18. DM  Family History:  Negative for cardiac or renal disease.  No FH of Colon Cancer  Social History:  Single, 5 children. Unemployed.  Tobacco Use - Yes. Smoked <  1 ppd, quit 6/10.  Alcohol Use - no  Regular Exercise - no  Drug Use - no, hx crack-cocaine use  Daily Caffeine Use: 2 daily   Review of Systems  All systems reviewed and negative except as per HPI.  Current Outpatient Prescriptions  Medication Sig Dispense Refill  . albuterol (PROAIR HFA) 108 (90 BASE) MCG/ACT inhaler Inhale 2 puffs into the lungs every 6 (six)  hours as needed for wheezing or shortness of breath.  6.7 g  2  . amitriptyline (ELAVIL) 150 MG tablet Take 1 tablet (150 mg total) by mouth at bedtime.  30 tablet  5  . atorvastatin (LIPITOR) 40 MG tablet Take 1 tablet (40 mg total) by mouth daily.  30 tablet  11  . carvedilol (COREG) 12.5 MG tablet TAKE ONE & ONE-HALF TABLETS BY MOUTH TWICE DAILY  90 tablet  1  . diclofenac sodium (VOLTAREN) 1 % GEL Apply 2 g topically 4 (four) times daily.  100 g  2  . febuxostat (ULORIC) 40 MG tablet Take 80 mg by mouth daily.      . ferrous sulfate 325 (65 FE) MG tablet Take 1 tablet (325 mg total) by mouth 3 (three) times daily with meals.  90 tablet  3  . gabapentin (NEURONTIN) 100 MG capsule Take 1 capsule (100 mg total) by mouth 3 (three) times daily.  90 capsule  0  . glucose blood (FREESTYLE LITE) test strip Use as instructed  100 each  12  . hydrALAZINE (APRESOLINE) 50 MG tablet TAKE ONE TABLET BY MOUTH THREE TIMES DAILY  90 tablet  0  . HYDROcodone-acetaminophen (NORCO) 10-325 MG per tablet Take 1 tablet by mouth every 6 (six) hours as needed for severe pain.  120 tablet  0  . Insulin Detemir (LEVEMIR) 100 UNIT/ML Pen Inject 50 Units into the skin daily at 10 pm.  45 mL  1  . isosorbide mononitrate (IMDUR) 60 MG 24 hr tablet Take 90 mg by mouth daily.      . Magnesium Oxide 400 MG CAPS Take 1 capsule (400 mg total) by mouth 2 (two) times daily.  4 capsule  0  . nystatin (MYCOSTATIN/NYSTOP) 100000 UNIT/GM POWD Apply 1 Bottle topically 2 (two) times daily.  30 g  0  . omeprazole (PRILOSEC) 40 MG capsule Take 1 capsule (40 mg total) by mouth daily.  30 capsule  3  . potassium chloride SA (K-DUR,KLOR-CON) 20 MEQ tablet Take 40 mEq by mouth 2 (two) times daily.       . predniSONE (DELTASONE) 20 MG tablet Take 1 tablet (20 mg total) by mouth daily.  3 tablet  0  . Rivaroxaban (XARELTO) 20 MG TABS tablet Take 20 mg by mouth every morning.      Marland Kitchen spironolactone (ALDACTONE) 25 MG tablet Take 25 mg by mouth  daily.      Marland Kitchen torsemide (DEMADEX) 100 MG tablet Take 1 tablet (100 mg total) by mouth 2 (two) times daily.  180 tablet  3  . [DISCONTINUED] QUEtiapine (SEROQUEL) 100 MG tablet Take 400 mg by mouth at bedtime.        No current facility-administered medications for this encounter.   Filed Vitals:   12/26/13 1357  BP: 118/68  Pulse: 94  Weight: 357 lb (161.934 kg)  SpO2: 94%   General: NAD, obese. Sitting in WC Neck: Thick neck, JVP difficult to assess d/t body habitus but appears ok, no thyromegaly or thyroid nodule.  Lungs:Clear  CV: Nondisplaced PMI.  Heart regular S1/S2, no S3/S4, no murmur.   No carotid bruit.  Normal pedal pulses.  Abdomen: Soft, markedly obese. Nontender, mild distention.  Neurologic: Alert and oriented x 3. Moves all 4 without difficulty Psych: Normal affect. Extremities: No clubbing or cyanosis. No bilateral edema  Assessment/Plan  1. Chronic diastolic CHF: NICM, EF recovered now 50-55% (ech 1/15). + RV failure. St Jude ICD.  - Doing well today. NYHA class II-III symptoms (chronic, stable).  - Metolazone recently stopped due to ARF - I think she previously was dry and we pushed dry weight down too much. Current regimen seems to be working well. Take metolazone only prn for weight 366 or greater. Watch ice intake.   - Continue current Coreg, imdur, hydralazine and spironolactone - She is not on Ace or Arb due to elevated creatinine - Reinforced the need and importance of daily weights, a low sodium diet, and fluid restriction (less than 2 L a day). Instructed to call the HF clinic if weight increases more than 3 lbs overnight or 5 lbs in a week.  - Continue paramedicine 2. A/CKD: Improving. Repeat BMET  3. Morbid obesity: Continue to work on weight loss and congratulated her on success so far. On waiting list for sleeve. Per Dr Shirlee Latch ok for  surgery.   4. DVT: Suspected DVT 11/14 admission.  She is on Xarelto, would probably continue long-term given her  inactivity.  5. Mitral valve repair: Stable on 1/15 echo.  6. Uncontrolled DM - Per IM 7. Hypomagnesemia - Checked during hospitalization and was 1.4. Started on Mag oxide 400 mg BID. Check today 8. Cough - per IM   Arvilla Meres MD  2:20 PM

## 2013-12-30 NOTE — Progress Notes (Signed)
Case discussed with Dr. Gill soon after the resident saw the patient.  We reviewed the resident's history and exam and pertinent patient test results.  I agree with the assessment, diagnosis, and plan of care documented in the resident's note. 

## 2014-01-01 ENCOUNTER — Ambulatory Visit: Payer: Medicare Other | Admitting: Dietician

## 2014-01-02 ENCOUNTER — Encounter: Payer: Medicare Other | Attending: General Surgery | Admitting: Dietician

## 2014-01-02 ENCOUNTER — Ambulatory Visit: Payer: Medicare Other | Admitting: Dietician

## 2014-01-02 DIAGNOSIS — E1129 Type 2 diabetes mellitus with other diabetic kidney complication: Secondary | ICD-10-CM | POA: Insufficient documentation

## 2014-01-02 DIAGNOSIS — N183 Chronic kidney disease, stage 3 unspecified: Secondary | ICD-10-CM | POA: Diagnosis not present

## 2014-01-02 DIAGNOSIS — Z713 Dietary counseling and surveillance: Secondary | ICD-10-CM | POA: Insufficient documentation

## 2014-01-02 NOTE — Patient Instructions (Signed)
-  Continue to limit sweets and portion sizes -Keep walking! Increase as tolerated -Look into getting another set of dentures 

## 2014-01-02 NOTE — Progress Notes (Signed)
  Medical Nutrition Therapy:  Appt start time: 1100 end time:  1115.  5th SWL visit: [name] returns today for her 5th SWL visit. She has gained 12 pounds. However, she has been off of her fluid pill for the past month. Has had some soda, ginger ale and grape sodas.  Has since stopped drinking sodas again. Still walking some as tolerated.   Wt Readings from Last 3 Encounters:  01/02/14 364 lb 1.6 oz (165.155 kg)  12/26/13 357 lb (161.934 kg)  12/09/13 360 lb 12.8 oz (163.658 kg)   Ht Readings from Last 3 Encounters:  12/02/13 5\' 6"  (1.676 m)  11/27/13 5\' 6"  (1.676 m)  11/27/13 5\' 6"  (1.676 m)   There is no weight on file to calculate BMI. @BMIFA @ Normalized weight-for-age data available only for age 62 to 20 years. Normalized stature-for-age data available only for age 62 to 20 years.   Preferred Learning Style:  No preference indicated   Learning Readiness:  Ready  MEDICATIONS: see list   DIETARY INTAKE:  24-hr recall:  B ( AM): flavored oatmeal  Snk ( AM): none  L ( PM): leftovers from the night before Snk ( PM): none D ( PM): fish, potatoes, green beans Snk ( PM): apples and vinegar  Beverages: Crystal Light, ice, coffee  Usual physical activity: walking outside for more than 5 minutes every day  Estimated energy needs: 1600 calories  Progress Towards Goal(s):  Some progress.   Nutritional Diagnosis:  [name]-3.3 Overweight/obesity related to past poor dietary habits and physical inactivity as evidenced by patient w/ pending Gastric Sleeve surgery following dietary guidelines for continued weight loss.     Intervention:  Nutrition counseling provided. Reviewed pre-op goals and protein shakes per patient request.  Teaching Method Utilized: Visual Auditory   Barriers to learning/adherence to lifestyle change: physical disability  Demonstrated degree of understanding via:  Teach Back   Monitoring/Evaluation:  Dietary intake, exercise, and body weight in 4  week(s).

## 2014-01-03 ENCOUNTER — Telehealth (HOSPITAL_COMMUNITY): Payer: Self-pay | Admitting: Vascular Surgery

## 2014-01-03 NOTE — Telephone Encounter (Signed)
Message sent to [facility] asking if 80mg  IV lasix can be administered at her home by them this weekend.  Also checking to see if they can get a bmet and pbnp on her next week.  Awaiting return message.

## 2014-01-03 NOTE — Telephone Encounter (Signed)
[name] [facility] called pt gain about 10 lbs over the pass week.. Please advise

## 2014-01-03 NOTE — Telephone Encounter (Signed)
Pt gained about 10 lbs.. Per Nurse Beth @ [facility]

## 2014-01-06 ENCOUNTER — Telehealth: Payer: Self-pay | Admitting: Internal Medicine

## 2014-01-06 ENCOUNTER — Ambulatory Visit (INDEPENDENT_AMBULATORY_CARE_PROVIDER_SITE_OTHER): Payer: Medicare Other | Admitting: Internal Medicine

## 2014-01-06 DIAGNOSIS — IMO0002 Reserved for concepts with insufficient information to code with codable children: Secondary | ICD-10-CM

## 2014-01-06 DIAGNOSIS — I10 Essential (primary) hypertension: Secondary | ICD-10-CM

## 2014-01-06 DIAGNOSIS — I5032 Chronic diastolic (congestive) heart failure: Secondary | ICD-10-CM

## 2014-01-06 DIAGNOSIS — F4321 Adjustment disorder with depressed mood: Secondary | ICD-10-CM

## 2014-01-06 DIAGNOSIS — R0602 Shortness of breath: Secondary | ICD-10-CM

## 2014-01-06 DIAGNOSIS — M25562 Pain in left knee: Secondary | ICD-10-CM

## 2014-01-06 DIAGNOSIS — M25561 Pain in right knee: Secondary | ICD-10-CM

## 2014-01-06 DIAGNOSIS — M1710 Unilateral primary osteoarthritis, unspecified knee: Secondary | ICD-10-CM

## 2014-01-06 DIAGNOSIS — M25569 Pain in unspecified knee: Secondary | ICD-10-CM

## 2014-01-06 DIAGNOSIS — M069 Rheumatoid arthritis, unspecified: Secondary | ICD-10-CM

## 2014-01-06 DIAGNOSIS — F4320 Adjustment disorder, unspecified: Secondary | ICD-10-CM | POA: Insufficient documentation

## 2014-01-06 DIAGNOSIS — M79609 Pain in unspecified limb: Secondary | ICD-10-CM

## 2014-01-06 DIAGNOSIS — M79605 Pain in left leg: Secondary | ICD-10-CM

## 2014-01-06 DIAGNOSIS — G4733 Obstructive sleep apnea (adult) (pediatric): Secondary | ICD-10-CM

## 2014-01-06 DIAGNOSIS — M79604 Pain in right leg: Secondary | ICD-10-CM

## 2014-01-06 LAB — BASIC METABOLIC PANEL WITH GFR
BUN: 23 mg/dL (ref 6–23)
CALCIUM: 9.7 mg/dL (ref 8.4–10.5)
CO2: 29 mEq/L (ref 19–32)
Chloride: 101 mEq/L (ref 96–112)
Creat: 1.46 mg/dL — ABNORMAL HIGH (ref 0.50–1.10)
GFR, Est African American: 47 mL/min — ABNORMAL LOW
GFR, Est Non African American: 41 mL/min — ABNORMAL LOW
GLUCOSE: 91 mg/dL (ref 70–99)
POTASSIUM: 3.5 meq/L (ref 3.5–5.3)
SODIUM: 141 meq/L (ref 135–145)

## 2014-01-06 LAB — PRO B NATRIURETIC PEPTIDE: Pro B Natriuretic peptide (BNP): 317.8 pg/mL — ABNORMAL HIGH (ref <126)

## 2014-01-06 MED ORDER — HYDROCODONE-ACETAMINOPHEN 10-325 MG PO TABS: 1.0000 | ORAL_TABLET | Freq: Four times a day (QID) | ORAL | Status: DC | PRN

## 2014-01-06 MED ORDER — ALPRAZOLAM 0.25 MG PO TABS: 0.2500 mg | ORAL_TABLET | Freq: Two times a day (BID) | ORAL | Status: DC | PRN

## 2014-01-06 NOTE — Assessment & Plan Note (Addendum)
Patient reports gaining 12 pounds recently. Patient does occasionally have shortness of breath, but none currently. No signs of volume overload on exam. Patient being followed by Dr. Gala Romney, cardiologist. Patient will have a visit from home health nurse tomorrow. Nurse called during appointment today and requested a BMET and BNP for the appointment tomorrow.  - BMET and BNP ordered  -  Continue on current medication.

## 2014-01-06 NOTE — Assessment & Plan Note (Signed)
BP Readings from Last 3 Encounters:  12/26/13 118/68  12/12/13 107/74  12/09/13 90/62    Lab Results  Component Value Date   NA 139 12/26/2013   K 3.7 12/26/2013   CREATININE 1.73* 12/26/2013    Assessment: Blood pressure control: controlled Progress toward BP goal:  at goal  Plan: Medications:  continue current medications: Hydralazine 50 mg 3 times a day, Imdur 60 mg daily, spironolactone 25 mg daily, carvedilol 12.5 mg daily

## 2014-01-06 NOTE — Telephone Encounter (Signed)
   Reason for call:   I received a call from [name] at 6:06 PM indicating that she was seen by her PCP Dr. Loma Lyons today and he prescribed Xanax but when she want to her [facility] pharmacy it was not there.   Pertinent Data:   Dr. Loma Lyons prescribed 0.25mg  alprazolam bid #14 tablets for one week today but [facility] [address]   Assessment / Plan / Recommendations:   [name] is a 53 year old female who was seen by PCP today and prescribed Xanax prn for grief reaction.   I have informed patient that [facility] [address] and not [employer].  She acknowledged the different pharmacy and said she will check with them and pick up the prescription and thanked me for the call.   As always, pt is advised that if symptoms worsen or new symptoms arise, they should go to an urgent care facility or to to ER for further evaluation.   Baltazar Apo, MD   01/06/2014, 6:06 PM
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
## 2014-01-06 NOTE — Assessment & Plan Note (Signed)
Patient reporting some mechanical issues with her CPAP machine. However, she has still been compliant with using it for most of the night.  - Encouraged to contact the manufacturer for troubleshooting.

## 2014-01-06 NOTE — Assessment & Plan Note (Signed)
Rheumatology following the patient, and patient is on methotrexate. This was added to her medication list. She will have followup with rheumatology, especially concerning the continuation of the prednisone.  - Continue methotrexate and prednisone.

## 2014-01-06 NOTE — Patient Instructions (Signed)
We have prescribed you Xanax for the next week to deal with your loss. This is not meant to be a long-term treatment for anxiety. Please practice caution with the medications, and please wear her CPAP at night.

## 2014-01-06 NOTE — Telephone Encounter (Signed)
   Reason for call:   I received a call from [name] at 730  PM indicating that she had not been able to [facility] after her phone call earlier this evening. Pt would really like to start medication tonight.   Pertinent Data:   Reviewed prescription, phone call, and recent clinic visit   Assessment / Plan / Recommendations:   [facility] and prescription for xanax 0.25mg  BID for 14 pills given. FYI sent to PCP.   As always, pt is advised that if symptoms worsen or new symptoms arise, they should go to an urgent care facility or to to ER for further evaluation.   Christen Bame, MD   01/06/2014, 7:34 PM

## 2014-01-06 NOTE — Assessment & Plan Note (Signed)
Patient continues to have chronic left knee pain secondary to osteoarthritis. She is requesting another refill of her Vicodin 10/325. She is currently on a pain contract with the clinic. She is still planning to have bariatric surgery.  - Refill Vicodin 10/325 mg every 6 hours when necessary #120.  - Return to clinic in one month for followup.
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
## 2014-01-07 ENCOUNTER — Encounter (HOSPITAL_COMMUNITY): Payer: Medicare Other

## 2014-01-07 NOTE — Progress Notes (Signed)
Internal Medicine Clinic Attending Date of visit: 01/06/2014  I saw and evaluated the patient.  I personally confirmed the key portions of the history and exam documented by Dr. Loma Newton and I reviewed pertinent patient test results.  The assessment, diagnosis, and plan were formulated together and I agree with the documentation in the resident's note.

## 2014-01-08 ENCOUNTER — Other Ambulatory Visit (HOSPITAL_COMMUNITY): Payer: Self-pay | Admitting: Internal Medicine

## 2014-01-08 ENCOUNTER — Telehealth (HOSPITAL_COMMUNITY): Payer: Self-pay | Admitting: *Deleted
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
## 2014-01-13 ENCOUNTER — Telehealth (HOSPITAL_COMMUNITY): Payer: Self-pay | Admitting: *Deleted

## 2014-01-13 NOTE — Telephone Encounter (Signed)
Beth called to report pt's wt is at 369 lb today, it was 369 last Wed and she was give 80 mg IV lasix she was out of town this weekend and missed a day of Torsemide, she states pt denies SOB and CP but does have some edema she is sch to see pt tomorrow and get bmet, per Tonye Becket, NP have pt take a dose of metolazone and 20 extra meq of kcl, Beth will let pt know

## 2014-01-14 ENCOUNTER — Telehealth: Payer: Self-pay | Admitting: *Deleted

## 2014-01-14 MED ORDER — POLYETHYLENE GLYCOL 3350 17 G PO PACK: 17.0000 g | PACK | Freq: Every day | ORAL | Status: AC | PRN

## 2014-01-14 NOTE — Telephone Encounter (Signed)
Wrote Rx for miralax.  Will reassess if patient calls back in for pain.   Debe Coder, MD

## 2014-01-14 NOTE — Telephone Encounter (Signed)
Call from [facility] with [facility] [phone]  Nurse called to report that pt has had a few falls.   Patient was out of town, was getting into a truck and fell backward landing on her back onto concrete.  She is c/o some mild low back pain from this. Last evening brakes on walker were not working and walker rolled out from her, she fell onto floor.  Denies injury just feels stiff.  Pt called and states she is having pain to left knee, some relief with her pain med. She said the pain is not bad and no swelling. She will call if it gets any worse. She is also asking for a refill on Miralax.  Not on med list but she was given a Rx on 09/18/13

## 2014-01-15 ENCOUNTER — Other Ambulatory Visit (INDEPENDENT_AMBULATORY_CARE_PROVIDER_SITE_OTHER): Payer: Self-pay | Admitting: General Surgery

## 2014-01-15 DIAGNOSIS — N63 Unspecified lump in unspecified breast: Secondary | ICD-10-CM

## 2014-01-16 DIAGNOSIS — I129 Hypertensive chronic kidney disease with stage 1 through stage 4 chronic kidney disease, or unspecified chronic kidney disease: Secondary | ICD-10-CM

## 2014-01-16 DIAGNOSIS — E119 Type 2 diabetes mellitus without complications: Secondary | ICD-10-CM

## 2014-01-16 DIAGNOSIS — I5043 Acute on chronic combined systolic (congestive) and diastolic (congestive) heart failure: Secondary | ICD-10-CM

## 2014-01-16 DIAGNOSIS — I509 Heart failure, unspecified: Secondary | ICD-10-CM

## 2014-01-21 ENCOUNTER — Telehealth: Payer: Self-pay | Admitting: *Deleted

## 2014-01-21 ENCOUNTER — Telehealth (HOSPITAL_COMMUNITY): Payer: Self-pay | Admitting: Vascular Surgery

## 2014-01-21 NOTE — Telephone Encounter (Signed)
Left message to call back  

## 2014-01-21 NOTE — Telephone Encounter (Signed)
Spoke w/Beth, RN she states pt's wt is back down and she is feeling better but c/o cramps, gave order for bmet, she will draw 9/9

## 2014-01-21 NOTE — Telephone Encounter (Signed)
Since we wlil not get results quickly, pls send off cx just in case.
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
## 2014-01-21 NOTE — Telephone Encounter (Signed)
Ask the home nurse to ask pt how many days of sxs she is having, any fever?, flank pain?, N/V? We need to know UA results immediately as pt is one I would want to get cx on bc has been on ABX in July.   Would need [facility] appt to address pysch eval on pt.

## 2014-01-21 NOTE — Telephone Encounter (Signed)
Pt weight over the weekend 366 pt took potassium and Metrolazone  ... Yesterday weight 361 today weight 359 . Pt complaining of cramping in legs.. Please advise

## 2014-01-21 NOTE — Telephone Encounter (Signed)
I talked with [name] and Pt denies Fever, Nausea/vomiting, or flank pain. She is having frequency and is having a hard time making it to the bathroom.  Onset 2 weeks ago. Burning is on and off, better today. Pt did c/o abd pain today.  Nurse will do U/A and we should get results by the next day.  I will schedule OV for other issues.

## 2014-01-22 NOTE — Telephone Encounter (Signed)
Talked with pt and scheduled appointment for Monday 9/14 to evaluate home situation. [name] informed to also send urine for culture

## 2014-01-23 ENCOUNTER — Telehealth: Payer: Self-pay | Admitting: *Deleted

## 2014-01-23 NOTE — Telephone Encounter (Signed)
Lab results from [facility] collection brought to triage by chilonb. Triage will give to attending, dr Midwife

## 2014-01-23 NOTE — Telephone Encounter (Signed)
Per dr Dorris Singh orders called dr bensimhon's officeto relay message from dr Dalphine Handing, spoke to camilla in dr bensimhon's office, she states she will make him aware of dr bhardwaj's note, i repeated those advisements to her and she assures me that he will see them and review the chart soon. i tried to call pt and she finally rtc, she states she has not had fevers, she is having great low back and flank pain, scale 8/ 10. She is having more "stinging" than burning. She was informed that her urine would be sent for culture and that possibly abx would be prescribed, if they are she will be called. She was also informed that she will probably get a call from dr bensimhon's office, that if she does not hear from his office by 1100 tomorrow to please call his office and check in to see if there are changes or labs to be done. i have also called the [facility] and left her a message to rtc for update.

## 2014-01-23 NOTE — Telephone Encounter (Signed)
If the patient is having low back pain and flank pain along with urinary symptoms, with a dirty UA, she needs to come to the clinic/ER and get worked up for Acute Pyelonephritis urgently.

## 2014-01-23 NOTE — Telephone Encounter (Addendum)
Reviewed labs provided, Dirty UA, tried to call patient about signs of UTI but could not reach her. Please call the patient and enquire about urinary symptoms - burning or painful micturition, feeling of urgency, incomplete evacuation, feeling the need to urinate frequently, altered appearance of urine, foul odor to urine or pain in the lower abdomen. Also if any fever or chills along with these symptoms. She might need to be seen, if she has any of these/ prescribed antibiotic. We will try to send urine cultures.   Creatinine has trended up from last time slightly 1.83. This will need to be monitored. AG elevated to 21 (no albumin levels). ?Contraction alkalosis (torsemide, metolazone).   [facility] BMP from 01/23/14 Glucose, Plasma 103 Mg/dl  BUN 48 Mg/dl  Creatinine 0.51 Mg/dl  Sodium 102 Mmol/L  Potassium 4.5 Mmol/L  Chloride 90 Mmol/L  CO2 30 Mmol/L  Calcium 9.2 Mg/dl   Would try to get in touch with the patient again. Might need decrease in diuretics but would defer to cardiology.

## 2014-01-24 ENCOUNTER — Telehealth (HOSPITAL_COMMUNITY): Payer: Self-pay | Admitting: Surgery

## 2014-01-24 ENCOUNTER — Ambulatory Visit
Admission: RE | Admit: 2014-01-24 | Discharge: 2014-01-24 | Disposition: A | Discharge status: Home / Self Care | Payer: Medicaid Other | Source: Ambulatory Visit | Attending: General Surgery | Admitting: General Surgery

## 2014-01-24 ENCOUNTER — Telehealth (HOSPITAL_COMMUNITY): Payer: Self-pay | Admitting: *Deleted

## 2014-01-24 DIAGNOSIS — N63 Unspecified lump in unspecified breast: Secondary | ICD-10-CM

## 2014-01-24 NOTE — Telephone Encounter (Signed)
Pt returned call, she was advised she should be seen asap, she states she feels better and will be at her appt mon 9/14. She is advised that if she starts symptoms of UTI as she has had recently to go to ED before Monday, she is agreeable

## 2014-01-24 NOTE — Telephone Encounter (Signed)
Received labs from [facility] bun 48, cr 1.83, K 4.5, abn UA.  Pt has taken metolazone several days for increased wt per [name], NP labs stable, UA addressed by pcp

## 2014-01-24 NOTE — Telephone Encounter (Signed)
Received a call from [facility] with [facility] to say that Ms. Huang's weight is up 5 lbs --per [name] we will give 80 mg IV Lasix X 1 dose today and extra 40 meq of Potassium today as well. [facility] will recheck her labs next week.

## 2014-01-26 ENCOUNTER — Other Ambulatory Visit: Payer: Self-pay | Admitting: Cardiology

## 2014-01-27 ENCOUNTER — Encounter: Payer: Self-pay | Admitting: Internal Medicine

## 2014-01-27 ENCOUNTER — Ambulatory Visit (INDEPENDENT_AMBULATORY_CARE_PROVIDER_SITE_OTHER): Payer: Medicare HMO | Admitting: Internal Medicine

## 2014-01-27 ENCOUNTER — Telehealth (HOSPITAL_COMMUNITY): Payer: Self-pay | Admitting: Vascular Surgery

## 2014-01-27 ENCOUNTER — Ambulatory Visit (INDEPENDENT_AMBULATORY_CARE_PROVIDER_SITE_OTHER): Payer: Medicare HMO | Admitting: *Deleted

## 2014-01-27 VITALS — BP 113/66 | HR 49 | Temp 97.9°F | Ht 66.0 in | Wt 376.1 lb

## 2014-01-27 DIAGNOSIS — G4733 Obstructive sleep apnea (adult) (pediatric): Secondary | ICD-10-CM | POA: Diagnosis not present

## 2014-01-27 DIAGNOSIS — N390 Urinary tract infection, site not specified: Secondary | ICD-10-CM

## 2014-01-27 DIAGNOSIS — Z87891 Personal history of nicotine dependence: Secondary | ICD-10-CM

## 2014-01-27 DIAGNOSIS — F3289 Other specified depressive episodes: Secondary | ICD-10-CM | POA: Diagnosis not present

## 2014-01-27 DIAGNOSIS — F4321 Adjustment disorder with depressed mood: Secondary | ICD-10-CM

## 2014-01-27 DIAGNOSIS — R350 Frequency of micturition: Secondary | ICD-10-CM

## 2014-01-27 DIAGNOSIS — F32A Depression, unspecified: Secondary | ICD-10-CM

## 2014-01-27 DIAGNOSIS — I5022 Chronic systolic (congestive) heart failure: Secondary | ICD-10-CM

## 2014-01-27 DIAGNOSIS — I428 Other cardiomyopathies: Secondary | ICD-10-CM

## 2014-01-27 DIAGNOSIS — F329 Major depressive disorder, single episode, unspecified: Secondary | ICD-10-CM

## 2014-01-27 DIAGNOSIS — I509 Heart failure, unspecified: Secondary | ICD-10-CM

## 2014-01-27 DIAGNOSIS — IMO0001 Reserved for inherently not codable concepts without codable children: Secondary | ICD-10-CM

## 2014-01-27 LAB — BASIC METABOLIC PANEL
BUN: 19 mg/dL (ref 6–23)
CO2: 31 meq/L (ref 19–32)
Calcium: 9 mg/dL (ref 8.4–10.5)
Chloride: 103 mEq/L (ref 96–112)
Creat: 1.24 mg/dL — ABNORMAL HIGH (ref 0.50–1.10)
Glucose, Bld: 96 mg/dL (ref 70–99)
POTASSIUM: 3.9 meq/L (ref 3.5–5.3)
SODIUM: 141 meq/L (ref 135–145)

## 2014-01-27 MED ORDER — CLONAZEPAM 0.25 MG PO TBDP: 0.2500 mg | ORAL_TABLET | Freq: Every day | ORAL | Status: DC | PRN

## 2014-01-27 MED ORDER — METOLAZONE 2.5 MG PO TABS: 2.5000 mg | ORAL_TABLET | ORAL | Status: DC

## 2014-01-27 MED ORDER — POTASSIUM CHLORIDE CRYS ER 20 MEQ PO TBCR: 40.0000 meq | EXTENDED_RELEASE_TABLET | Freq: Two times a day (BID) | ORAL | Status: AC

## 2014-01-27 NOTE — Assessment & Plan Note (Signed)
Pt came in today requesting refill of antianxiety medication, and is very particular about what she wants- higher dose of xanax or Clonipine, which she says she sister takes. Pt says the Alprazolam she was given was ineffective and  She was given only 14 pills.Pt is refusing to try any other thing. Pt say she lost her nephew 2 weeks ago, has not been able to sleep or use her C-PAP. Pt keeps saying "it feels like my heart was ripped out", when asked about her anxiety symptoms. Pt today is wearing the a T-shirt with her nephews picture on it and saying his loss is the cause of her needed anxiety meds.   Plan- Counselled pt that xanax is not a long term treatment for her anxiety.  - Finally agreed to [employer]- 0.25mg  daily #14 pills (as it less addicting)  on the premise that she needs to see a PSychiatrist, for grief counselling and control of her anxiety symptoms. I cannot see an SSRI on pts med History, she might benefit from that, appears she might not be responding to Elavil. Pt says she dosent want to go to [facility] that she was there and was prescribed several meds off and on. Will therefore refer her else where.  - Pt told that our clinic will not be prescribing this medication any more, as we do not think its in her best interest.

## 2014-01-27 NOTE — Telephone Encounter (Signed)
Beth, RN is aware and will notify pt

## 2014-01-27 NOTE — Assessment & Plan Note (Signed)
Pt reports she has not ben able to use her CPAP machine in the past week, endorses anxiety making complaince difficult. Pt says the dose of Alprazolam- 0.25mg   #14 pills were not enough and not effective.  Plan- Clonipin- 0.25mg  daily- #14 pills.

## 2014-01-27 NOTE — Telephone Encounter (Signed)
Spoke w/Beth, she states pt's wt has not changed after IV lasix, she feels most of the issue is that pt gets depressed with home issues and drinks and eat what she wants, pt is seeing pcp today to discuss antidepressant med.  Pt is taking Torsemide 100 mg Twice daily and Spiro 25 mg daily, Beth questions if pt should be back on Metolazone 1 or 2 times a week, will discuss with Dr Shirlee Latch and call her back

## 2014-01-27 NOTE — Telephone Encounter (Signed)
Left message for [name] to call back

## 2014-01-27 NOTE — Telephone Encounter (Signed)
Metolazone 2.5 mg once a week, 1st dose tomorrow, before am torsemide.  Take an extra 20 mEq KCl on metolazone days.

## 2014-01-27 NOTE — Progress Notes (Signed)
Remote ICD transmission.   

## 2014-01-27 NOTE — Patient Instructions (Addendum)
General Instructions:   We will be prescribing a dose of Clonipin- take one tablet only at night as needed for anxiety. You need to see a Psychiatrist so you can be put on the right medication for your anxiety, we will therefore be refering you to a psychiatrist, as we will not be prescribing this medication any more from our clinic.   Please bring your medicines with you each time you come to clinic.  Medicines may include prescription medications, over-the-counter medications, herbal remedies, eye drops, vitamins, or other pills.

## 2014-01-27 NOTE — Progress Notes (Signed)
Patient ID: [name], female   DOB: 05/05/1961, 53 y.o.   MRN: [id number]   Subjective:   Patient ID: [name] female   DOB: 1960-08-20 53 y.o.   MRN: [id number]  HPI: [name] is a 53 y.o. with PMH of HTN, DM2, CKD3, depression, OSA, NICM, presented today with out any specific complaints but for refill of Xanax or clonipin which she says her sister has been taking and is working for her.  Past Medical History  Diagnosis Date  . Severe mitral regurgitation     a. Minimally invasive MV repair on 12/18/08. Additionally TV repair  and PFO closure.  . Chronic systolic CHF (congestive heart failure)     a. 12/2011 Echo: EF 30%, diff HK, mild LVH, Gr 1 DD, mildly dil LA. b. 2/2 NICM.  Marland Kitchen Microcytic anemia     a. Small capsule endoscopy 2012 with gastric erosion and small colonic AVMs. b. EGD and colonoscopy before that in 12/11 did not reveal source of bleeding)  . Gastric ulcer   . NICM (nonischemic cardiomyopathy)     a.12/2011: s/p SJM [id number] single lead ICD, Ser #: [id number]  . Obstructive sleep apnea     on CPAP  . GERD (gastroesophageal reflux disease)   . HTN (hypertension)   . Gout   . Depression   . Colitis 2/11  . Chronic back pain   . Chronic knee pain   . DVT (deep venous thrombosis)     right leg  . Anxiety   . Rheumatoid arthritis(714.0)     "shoulders & knees"  . Sleep disturbance     07/14/11 "haven't slept in 10 months; since going to [facility]"  . Hx: UTI (urinary tract infection)   . Nocturia   . COPD (chronic obstructive pulmonary disease)   . Bronchitis   . PTSD (post-traumatic stress disorder)   . Schizophrenia   . Dysmenorrhea   . Morbid obesity   . Acute renal insufficiency     a. ?Cardiorenal 09/2012.  Marland Kitchen Hyperglycemia   . Automatic implantable cardioverter-defibrillator in situ   . Chronic diastolic HF (heart failure)     a. ECHO (05/2013) EF 50-55%, RV dilated and KH   Current Outpatient Prescriptions  Medication Sig Dispense  Refill  . albuterol (PROAIR HFA) 108 (90 BASE) MCG/ACT inhaler Inhale 2 puffs into the lungs every 6 (six) hours as needed for wheezing or shortness of breath.  6.7 g  2  . ALPRAZolam (XANAX) 0.25 MG tablet Take 1 tablet (0.25 mg total) by mouth 2 (two) times daily as needed for anxiety.  14 tablet  0  . amitriptyline (ELAVIL) 150 MG tablet Take 1 tablet (150 mg total) by mouth at bedtime.  30 tablet  5  . atorvastatin (LIPITOR) 40 MG tablet Take 1 tablet (40 mg total) by mouth daily.  30 tablet  11  . carvedilol (COREG) 12.5 MG tablet TAKE ONE & ONE-HALF TABLETS BY MOUTH TWICE DAILY  90 tablet  1  . diclofenac sodium (VOLTAREN) 1 % GEL Apply 2 g topically 4 (four) times daily.  100 g  2  . febuxostat (ULORIC) 40 MG tablet Take 80 mg by mouth daily.      . ferrous sulfate 325 (65 FE) MG tablet Take 1 tablet (325 mg total) by mouth 3 (three) times daily with meals.  90 tablet  3  . gabapentin (NEURONTIN) 100 MG capsule Take 1 capsule (100 mg total) by mouth 3 (three) times  daily.  90 capsule  0  . glucose blood (FREESTYLE LITE) test strip Use as instructed  100 each  12  . hydrALAZINE (APRESOLINE) 50 MG tablet TAKE ONE TABLET BY MOUTH THREE TIMES DAILY  90 tablet  0  . HYDROcodone-acetaminophen (NORCO) 10-325 MG per tablet Take 1 tablet by mouth every 6 (six) hours as needed for severe pain.  120 tablet  0  . Insulin Detemir (LEVEMIR) 100 UNIT/ML Pen Inject 50 Units into the skin daily at 10 pm.  45 mL  1  . isosorbide mononitrate (IMDUR) 60 MG 24 hr tablet Take 90 mg by mouth daily.      . Magnesium Oxide 400 MG CAPS Take 1 capsule (400 mg total) by mouth 2 (two) times daily.  4 capsule  0  . methotrexate (RHEUMATREX) 2.5 MG tablet Take 5 mg by mouth 3 (three) times a week.       . metolazone (ZAROXOLYN) 2.5 MG tablet Take 1 tablet (2.5 mg total) by mouth once a week. Every Tuesday  90 tablet  3  . nystatin (MYCOSTATIN/NYSTOP) 100000 UNIT/GM POWD Apply 1 Bottle topically 2 (two) times daily.  30 g   0  . omeprazole (PRILOSEC) 40 MG capsule Take 1 capsule (40 mg total) by mouth daily.  30 capsule  3  . polyethylene glycol (MIRALAX / GLYCOLAX) packet Take 17 g by mouth daily as needed for moderate constipation.  14 each  1  . potassium chloride SA (K-DUR,KLOR-CON) 20 MEQ tablet Take 2 tablets (40 mEq total) by mouth 2 (two) times daily. Take on extra 20 meq on Tuesday with Metolazone      . predniSONE (DELTASONE) 20 MG tablet Take 1 tablet (20 mg total) by mouth daily.  3 tablet  0  . Rivaroxaban (XARELTO) 20 MG TABS tablet Take 20 mg by mouth every morning.      Marland Kitchen spironolactone (ALDACTONE) 25 MG tablet Take 25 mg by mouth daily.      Marland Kitchen spironolactone (ALDACTONE) 25 MG tablet TAKE ONE TABLET BY MOUTH ONCE DAILY  30 tablet  3  . torsemide (DEMADEX) 100 MG tablet Take 1 tablet (100 mg total) by mouth 2 (two) times daily.  180 tablet  3  . [DISCONTINUED] QUEtiapine (SEROQUEL) 100 MG tablet Take 400 mg by mouth at bedtime.        No current facility-administered medications for this visit.   Family History  Problem Relation Age of Onset  . Hypertension Sister   . Alcoholism Father     died in his 58's or 73's  . Other Mother     alive & well @ 33.   History   Social History  . Marital Status: Single    Spouse Name: N/A    Number of Children: N/A  . Years of Education: N/A   Occupational History  . disability    Social History Main Topics  . Smoking status: Former Smoker -- 0.50 packs/day for 27 years    Types: Cigarettes    Quit date: 12/18/2008  . Smokeless tobacco: Never Used  . Alcohol Use: No     Comment: "stopped drinking alcohol 12/2003; did drink ~ 6 shots/wk"  . Drug Use: No     Comment: "last cocaine use 2009"  . Sexual Activity: No   Other Topics Concern  . None   Social History Narrative   Pt lives in [place] with her dtr.  On disability.   Review of Systems: CONSTITUTIONAL- No Fever, weightloss,  night sweat or change in appetite. SKIN- No Rash, colour changes  or itching. HEAD- No Headache or dizziness. RESPIRATORY- No Cough or SOB. CARDIAC- No Palpitations, DOE, PND or chest pain. GI- No nausea, vomiting, diarrhoea, constipation, abd pain. NEUROLOGIC- No Numbness, syncope, seizures or burning. [facility]- Denies depression or anxiety.  Objective:  Physical Exam: Filed Vitals:   01/27/14 1625  BP: 113/66  Pulse: 49  Temp: 97.9 F (36.6 C)  TempSrc: Oral  Height: 5\' 6"  (1.676 m)  Weight: 376 lb 1.6 oz (170.598 kg)  SpO2: 100%   GENERAL- alert, co-operative, appears as stated age, not in any distress. HEENT- Atraumatic, normocephalic, PERRL, EOMI, oral mucosa appears moist, good and intact dentition. No carotid bruit, no cervical LN enlargement, thyroid does not appear enlarged, neck supple. CARDIAC- RRR, no murmurs, rubs or gallops. RESP- Moving equal volumes of air, and clear to auscultation bilaterally, no wheezes or crackles. ABDOMEN- Soft, nontender, no guarding or rebound,  no CVA tenderness. NEURO- No obvious Cr N abnormality, strenght upper and lower extremities-  intact, morbidly obese on wheel chair. EXTREMITIES- pulse 2+, symmetric, no pedal edema. SKIN- Warm, dry, No rash or lesion. PSYCH- Normal mood and affect, appropriate thought content and speech.  Assessment & Plan:   The patient's case and plan of care was discussed with attending physician, Dr. .  Please see problem based charting for assessment and plan.

## 2014-01-27 NOTE — Telephone Encounter (Signed)
Beth from [facility] left message pt had Iv Lasix Friday weight 367 prior to Lasix.Marland Kitchen Pt called yesterday told Beth weight was 357...  Pt called this morning weight today is 370 pt said she might have read the scale wrong yesterday .Marland Kitchen Please advise

## 2014-01-28 ENCOUNTER — Telehealth: Payer: Self-pay | Admitting: Licensed Clinical Social Worker

## 2014-01-28 LAB — MDC_IDC_ENUM_SESS_TYPE_REMOTE
Battery Voltage: 2.99 V
Brady Statistic RV Percent Paced: 1 %
HIGH POWER IMPEDANCE MEASURED VALUE: 80 Ohm
HighPow Impedance: 80 Ohm
Lead Channel Impedance Value: 490 Ohm
Lead Channel Pacing Threshold Amplitude: 0.75 V
Lead Channel Pacing Threshold Pulse Width: 0.5 ms
Lead Channel Sensing Intrinsic Amplitude: 12 mV
Lead Channel Setting Pacing Amplitude: 2.5 V
Lead Channel Setting Pacing Pulse Width: 0.5 ms
Lead Channel Setting Sensing Sensitivity: 0.5 mV
MDC IDC MSMT BATTERY REMAINING LONGEVITY: 76 mo
MDC IDC MSMT BATTERY REMAINING PERCENTAGE: 76 %
MDC IDC PG SERIAL: 1022457
MDC IDC SESS DTM: 20150914060018
MDC IDC SET ZONE DETECTION INTERVAL: 300 ms
MDC IDC SET ZONE DETECTION INTERVAL: 400 ms
Zone Setting Detection Interval: 250 ms

## 2014-01-28 NOTE — Telephone Encounter (Signed)
Ms. Yingling was referred to CSW for psychiatry referral.  Pt is dual eligible Medicare/Medicaid.  CSW placed call to Ms. Gaynell Face.  Pt states "they want me to see someone".  CSW inquired as to pt's motivation for psychiatry services.  Pt hesitated but states "I do need to handle this grief and anxiety".  Pt in agreement to referral.  Ms. Northcraft has been to [facility] in the past but does not want to return.  CSW discuss agencies that accept both medicare/medicaid.  Pt in agreement to referral to [facility] or [facility] and [employer].  Ms. Neukam states transportation is not an issue and she prefers late afternoon appointments, 3:00/3:30/Thursdays.  CSW faxed referral to [facility], awaiting response.

## 2014-01-29 ENCOUNTER — Encounter: Payer: Medicare HMO | Attending: General Surgery | Admitting: Dietician

## 2014-01-29 DIAGNOSIS — E1129 Type 2 diabetes mellitus with other diabetic kidney complication: Secondary | ICD-10-CM | POA: Insufficient documentation

## 2014-01-29 DIAGNOSIS — N183 Chronic kidney disease, stage 3 unspecified: Secondary | ICD-10-CM | POA: Insufficient documentation

## 2014-01-29 DIAGNOSIS — Z713 Dietary counseling and surveillance: Secondary | ICD-10-CM | POA: Diagnosis not present

## 2014-01-29 NOTE — Progress Notes (Signed)
Internal Medicine Clinic Attending Date of visit: 01/27/2014   Case discussed with Dr. Emokpae soon after the resident saw the patient.  We reviewed the resident's history and exam and pertinent patient test results.  I agree with the assessment, diagnosis, and plan of care documented in the resident's note. 

## 2014-01-29 NOTE — Progress Notes (Signed)
Supervised Weight Loss:  Appt start time: 405 end time:  420.  6th SWL visit: [name] returns today for her 6th SWL visit. Her weight has increased, as she is noncompliant with her fluid restriction. She states that her medications make her mouth dry and she feels like she always needs to sip fluids. She states she has been trying to chew thoroughly, but still needs a new set of dentures.  Still trying to walk, but having a lot of knee pain. Plans to get a knee replacement after she loses weight. Avoiding sweets, sucks on [place] lifesavers.  Wt Readings from Last 3 Encounters:  01/29/14 368 lb 9.6 oz (167.196 kg)  01/27/14 376 lb 1.6 oz (170.598 kg)  01/02/14 364 lb 1.6 oz (165.155 kg)   Ht Readings from Last 3 Encounters:  01/27/14 5\' 6"  (1.676 m)  12/02/13 5\' 6"  (1.676 m)  11/27/13 5\' 6"  (1.676 m)   There is no weight on file to calculate BMI. @BMIFA @ Normalized weight-for-age data available only for age 81 to 20 years. Normalized stature-for-age data available only for age 81 to 20 years.   Preferred Learning Style:  No preference indicated   Learning Readiness:  Ready  MEDICATIONS: see list   DIETARY INTAKE:  24-hr recall:  B ( AM): flavored oatmeal  Snk ( AM): none  L ( PM): leftovers from the night before Snk ( PM): none D ( PM): fish, potatoes, green beans Snk ( PM): apples and vinegar  Beverages: Crystal Light, ice, coffee  Usual physical activity: walking outside for more than 5 minutes every day  Estimated energy needs: 1600 calories  Progress Towards Goal(s):  Some progress.   Nutritional Diagnosis:  [place]-3.3 Overweight/obesity related to past poor dietary habits and physical inactivity as evidenced by patient w/ pending Gastric Sleeve surgery following dietary guidelines for continued weight loss.     Intervention:  Nutrition counseling provided. Reviewed pre-op goals and protein shakes per patient request.  Teaching Method  Utilized: Visual Auditory   Barriers to learning/adherence to lifestyle change: physical disability  Demonstrated degree of understanding via:  Teach Back   Monitoring/Evaluation:  Dietary intake, exercise, and body weight in 4 week(s).

## 2014-01-29 NOTE — Patient Instructions (Signed)
-  Continue to limit sweets and portion sizes -Keep walking! Increase as tolerated -Look into getting another set of dentures 

## 2014-01-30 LAB — URINE CULTURE: Colony Count: >=100000

## 2014-01-30 MED ORDER — LEVOFLOXACIN 250 MG PO TABS: 250.0000 mg | ORAL_TABLET | Freq: Every day | ORAL | Status: DC

## 2014-01-30 NOTE — Assessment & Plan Note (Addendum)
Pt denied Dysuria or frequency on presentation to clinic. As per chart 3 days prior pt was complaining of flank pain with dysuria, there was concern for UTI, pyelonephritis, but pt did not seek medical care. Pt has home health and a UA was done which was dirty, Urine culture was therefore ordered, on calling pt with results of urine culture, pt say she wakes up 3 times at night to pass urine, but otherwise feels fine except for her chronic pain. Will therefore treat with levaquin. Pt might benefit from further workup to ensure no obstruction or prophylactic therapy for UTI after ensuring clearance of present UTI by negative urine culture, as she has had at least 3 previous documented UTI since January.   Plan- Levaquin 250mg  daily X3 days. - talked to pt about prescription.

## 2014-01-30 NOTE — Telephone Encounter (Signed)
[facility] unable to accept.  CSW will contact [facility] and [employer].

## 2014-01-30 NOTE — Addendum Note (Signed)
Addended by: Onnie Boer on: 01/30/2014 09:49 AM   Modules accepted: Orders

## 2014-01-31 ENCOUNTER — Telehealth (HOSPITAL_COMMUNITY): Payer: Self-pay | Admitting: Vascular Surgery

## 2014-01-31 NOTE — Telephone Encounter (Signed)
Nurse case manager [facility] called about PT prescription for Spironolctone  Pt called pharmacy and it was never order.. Please advise

## 2014-02-04 NOTE — Telephone Encounter (Signed)
rx for spiro was sent over 01/21/14, yes pt should be on spironolactone 25 mg daily Detailed message left for [facility], case Production designer, theatre/television/film

## 2014-02-06 ENCOUNTER — Encounter: Payer: Self-pay | Admitting: Internal Medicine

## 2014-02-06 ENCOUNTER — Other Ambulatory Visit: Payer: Self-pay | Admitting: *Deleted

## 2014-02-06 DIAGNOSIS — M1710 Unilateral primary osteoarthritis, unspecified knee: Secondary | ICD-10-CM

## 2014-02-06 DIAGNOSIS — M25561 Pain in right knee: Secondary | ICD-10-CM

## 2014-02-06 DIAGNOSIS — M25562 Pain in left knee: Principal | ICD-10-CM

## 2014-02-06 MED ORDER — HYDROCODONE-ACETAMINOPHEN 10-325 MG PO TABS: 1.0000 | ORAL_TABLET | Freq: Four times a day (QID) | ORAL | Status: DC | PRN

## 2014-02-06 NOTE — Telephone Encounter (Signed)
Last refill on 01/06/14 per pharmacy Call when ready  -  Pt # [phone]

## 2014-02-10 ENCOUNTER — Encounter: Payer: Self-pay | Admitting: Internal Medicine

## 2014-02-10 ENCOUNTER — Other Ambulatory Visit: Payer: Self-pay | Admitting: Nurse Practitioner

## 2014-02-10 ENCOUNTER — Ambulatory Visit (INDEPENDENT_AMBULATORY_CARE_PROVIDER_SITE_OTHER): Payer: Medicare HMO | Admitting: Internal Medicine

## 2014-02-10 ENCOUNTER — Telehealth (HOSPITAL_COMMUNITY): Payer: Self-pay

## 2014-02-10 ENCOUNTER — Other Ambulatory Visit: Payer: Self-pay | Admitting: Cardiology

## 2014-02-10 VITALS — BP 94/80 | HR 90 | Temp 98.0°F | Ht 66.0 in | Wt 376.5 lb

## 2014-02-10 DIAGNOSIS — F4321 Adjustment disorder with depressed mood: Secondary | ICD-10-CM | POA: Diagnosis not present

## 2014-02-10 DIAGNOSIS — IMO0002 Reserved for concepts with insufficient information to code with codable children: Secondary | ICD-10-CM

## 2014-02-10 DIAGNOSIS — I10 Essential (primary) hypertension: Secondary | ICD-10-CM

## 2014-02-10 DIAGNOSIS — E1165 Type 2 diabetes mellitus with hyperglycemia: Secondary | ICD-10-CM

## 2014-02-10 DIAGNOSIS — E1129 Type 2 diabetes mellitus with other diabetic kidney complication: Secondary | ICD-10-CM | POA: Diagnosis not present

## 2014-02-10 LAB — GLUCOSE, CAPILLARY: Glucose-Capillary: 100 mg/dL — ABNORMAL HIGH (ref 70–99)

## 2014-02-10 MED ORDER — ALPRAZOLAM 0.5 MG PO TABS: 0.5000 mg | ORAL_TABLET | Freq: Every day | ORAL | Status: DC

## 2014-02-10 NOTE — Progress Notes (Signed)
Medicine attending: Medical history, presenting problems, physical findings, and medications, reviewed in the presence of the patient with resident physician Dr. Harold Barban and I concur with his evaluation and management plan. I strongly encouraged the patient to seek professional counseling to help deal with her anxiety issues. We will again give a short term extension on her alprazolam pending further counseling. Cephas Darby, M.D., FACP

## 2014-02-10 NOTE — Patient Instructions (Addendum)
We have prescribed another 14 days of xanax 0.5 mg for you with one refill. It is very important that you follow up with a psychiatrist for management of these issues. Follow up in 1 month in clinic.   General Instructions:   Please bring your medicines with you each time you come to clinic.  Medicines may include prescription medications, over-the-counter medications, herbal remedies, eye drops, vitamins, or other pills.   Progress Toward Treatment Goals:  Treatment Goal 02/10/2014  Hemoglobin A1C unchanged  Blood pressure at goal  Prevent falls -    Self Care Goals & Plans:  Self Care Goal 02/10/2014  Manage my medications take my medicines as prescribed; bring my medications to every visit  Monitor my health keep track of my blood glucose; bring my glucose meter and log to each visit  Eat healthy foods drink diet soda or water instead of juice or soda; eat more vegetables; eat foods that are low in salt; eat baked foods instead of fried foods; eat fruit for snacks and desserts  Be physically active -  Meeting treatment goals -    Home Blood Glucose Monitoring 09/30/2013  Check my blood sugar 2 times a day  When to check my blood sugar before meals     Care Management & Community Referrals:  Referral 10/04/2013  Referrals made for care management support none needed  Referrals made to community resources -

## 2014-02-10 NOTE — Telephone Encounter (Signed)
[facility] RN Beth called concerning patient's increased weight.  Feels this is more "true weight" than fluid weight, as patient is dealing with increased stressors at home and has been "stress eating" and not sticking with her heart healthy, low sodium, low fluid diet recommended by our clinic for her CHF.  Patient's ankle and abdominal circumferences has not changes, and patient has no s/s indicative of CHF exacerbation.  Called patient to follow up who agrees this is stress related and does not feel this is related to fluid build up.  Reinforced diet and fluid restrictions and encouraged to call [facility] laboratory scientific officer) or Annice Pih Merchant navy officer) with our services if she felt that she needed additional resources to help deal with her stress.  Aware, agreeable, and appreciative. [name]

## 2014-02-10 NOTE — Assessment & Plan Note (Signed)
BP Readings from Last 3 Encounters:  02/10/14 94/80  01/27/14 113/66  12/26/13 118/68    Lab Results  Component Value Date   NA 141 01/27/2014   K 3.9 01/27/2014   CREATININE 1.24* 01/27/2014    Assessment: Blood pressure control: controlled Progress toward BP goal:  at goal Comments: Patient with mildly low blood pressures in clinic today. Patient denies any symptoms of lightheadedness or syncope at home.  Plan: Medications:  continue current medications. Will followup in one month.

## 2014-02-10 NOTE — Assessment & Plan Note (Signed)
Patient with continued anxiety and is requesting a higher dose of Xanax. Patient has reported that previous doses of Klonopin and Xanax have been completely ineffective in helping her anxiety. Patient also states that she does not want to go to the [facility] for further management as they have previously put her on extensive array of medications, none of which have worked. Patient states that she just needs something to help her go to sleep well she is getting over the loss of her nephew. She is currently in the process of getting an appointment with another psychiatrist, but has not been able to arrange for that appointment yet.  - Xanax 0.5 mg each bedtime #14 with one refill prescribed. Patient again counseled that this should not be a chronic medication for her anxiety, and that this would be the last prescription that we write in clinic for Xanax. Further management of her anxiety should be managed by her psychiatrist. Patient expressed that she understands and agrees with this plan.

## 2014-02-10 NOTE — Assessment & Plan Note (Signed)
  Lab Results  Component Value Date   HGBA1C 7.6* 11/22/2013   HGBA1C 13.0* 08/20/2013   HGBA1C 6.7 09/24/2012     Assessment: Diabetes control: fair control Progress toward A1C goal:  unchanged Comments: Glucometer again having issues with download in clinic.   Plan: Medications:  continue current medications. Will followup hemoglobin A1c in one month at next clinic visit. \Instruction/counseling given: reminded to bring blood glucose meter & log to each visit and discussed the need for weight loss Educational resources provided: brochure Self management tools provided: copy of home glucose meter download

## 2014-02-10 NOTE — Progress Notes (Signed)
Hypoglycemic Event  CBG: 55  Treatment: 3 glucose tabs  Symptoms: Hungry  Follow-up CBG: Time:16:13 CBG Result 78  Possible Reasons for Event: Inadequate meal intake  Comments/MD notified: yES    Herbin, Lars Masson  Remember to initiate Hypoglycemia Order Set & complete

## 2014-02-10 NOTE — Progress Notes (Signed)
   Subjective:    Patient ID: [name], female    DOB: January 08, 1961, 53 y.o.   MRN: [id number]  HPI  [name] is a 53 year old woman type 2 diabetes, nonischemic cardiomyopathy, hypertension, DVT, rheumatoid arthritis, stage III chronic kidney disease, severe obesity, schizoaffective disorder, depression who presents to clinic for refill of her anti-anxiety medications.   Please refer to separate problem-list charting for more details.  Review of Systems  Constitutional: Negative for fever and chills.  HENT: Negative for rhinorrhea and sore throat.   Eyes: Negative for visual disturbance.  Respiratory: Positive for cough, chest tightness ( several weeks since losing nephew) and shortness of breath ( exertional).   Cardiovascular: Negative for chest pain and palpitations.  Gastrointestinal: Positive for abdominal pain ( lower abdominal pain), constipation and blood in stool ( blood on toilet paper when wiping). Negative for nausea, vomiting and diarrhea.  Genitourinary: Negative for dysuria and hematuria.  Neurological: Negative for syncope.      Objective:   Physical Exam  Constitutional: She is oriented to person, place, and time. She appears well-developed and well-nourished. No distress.  Obese   HENT:  Head: Normocephalic and atraumatic.  Eyes: EOM are normal. Pupils are equal, round, and reactive to light. Left eye exhibits no discharge.  Neck: Normal range of motion. Neck supple. No thyromegaly present.  Cardiovascular: Normal rate and regular rhythm.  Exam reveals no gallop and no friction rub.   No murmur heard. Pulmonary/Chest: Effort normal and breath sounds normal. No respiratory distress. She has no wheezes. She has no rales.  Abdominal: Soft. Bowel sounds are normal. She exhibits no distension. There is no tenderness. There is no rebound.  Musculoskeletal: She exhibits edema ( 1+ to above mid-calf).  Neurological: She is alert and oriented to person, place,  and time. No cranial nerve deficit.  Skin: Skin is warm and dry. No rash noted.  Psychiatric: She has a normal mood and affect. Thought content normal.       Assessment & Plan:  Please refer to separate problem-list charting for more details.

## 2014-02-11 ENCOUNTER — Encounter: Payer: Self-pay | Admitting: *Deleted

## 2014-02-12 ENCOUNTER — Telehealth (HOSPITAL_COMMUNITY): Payer: Self-pay | Admitting: Vascular Surgery

## 2014-02-12 NOTE — Telephone Encounter (Signed)
Order faxed for [facility] to admin 80mg  IV lasix tomorrow and Friday.

## 2014-02-12 NOTE — Telephone Encounter (Signed)
[name] called pt weight is up  371 today yesterday 368 .Marland Kitchen pt is having increased SOB.. If you want to do IV lasix fax order to [phone].Marland Kitchen Please advise

## 2014-02-13 ENCOUNTER — Other Ambulatory Visit: Payer: Self-pay | Admitting: Internal Medicine

## 2014-02-13 ENCOUNTER — Encounter: Payer: Self-pay | Admitting: Gastroenterology

## 2014-02-13 ENCOUNTER — Telehealth (HOSPITAL_COMMUNITY): Payer: Self-pay | Admitting: Vascular Surgery

## 2014-02-13 ENCOUNTER — Ambulatory Visit (INDEPENDENT_AMBULATORY_CARE_PROVIDER_SITE_OTHER): Payer: Medicare HMO | Admitting: Gastroenterology

## 2014-02-13 VITALS — BP 100/78 | HR 70 | Ht 66.0 in | Wt 379.0 lb

## 2014-02-13 DIAGNOSIS — K625 Hemorrhage of anus and rectum: Secondary | ICD-10-CM | POA: Insufficient documentation

## 2014-02-13 DIAGNOSIS — I5022 Chronic systolic (congestive) heart failure: Secondary | ICD-10-CM

## 2014-02-13 DIAGNOSIS — I429 Cardiomyopathy, unspecified: Secondary | ICD-10-CM

## 2014-02-13 DIAGNOSIS — I428 Other cardiomyopathies: Secondary | ICD-10-CM

## 2014-02-13 DIAGNOSIS — N183 Chronic kidney disease, stage 3 unspecified: Secondary | ICD-10-CM

## 2014-02-13 NOTE — Telephone Encounter (Signed)
[facility] called about order for walker w/ seat that was faxed over last week please advise

## 2014-02-13 NOTE — Assessment & Plan Note (Signed)
Patient has had limited rectal bleeding is likely is due to 2 hemorrhoids.  Any bleeding is exacerbated by medicine-induced coagulopathy.  Symptoms are minimal.    Recommendations #1 CBC; stable no further therapy for rectal bleeding

## 2014-02-13 NOTE — Patient Instructions (Signed)
Follow up as needed

## 2014-02-13 NOTE — Progress Notes (Signed)
_                                                                                                                History of Present Illness:  [name] is a 53 year old African American female referred for evaluation of rectal bleeding.  She tends to be constipated.  With straining or with multiple bowel movements she may see small amounts of blood on the toilet tissue only.  She denies rectal or abdominal pain.  There has been no change in bowel habits.  She is on Zaroxolyn.  In 2011 she underwent upper and lower endoscopy and capsule endoscopy for workup of Hemoccult-positive stool.  Gastric erosions and small colonic AVMs were seen.   Past Medical History  Diagnosis Date  . Severe mitral regurgitation     a. Minimally invasive MV repair on 12/18/08. Additionally TV repair  and PFO closure.  . Chronic systolic CHF (congestive heart failure)     a. 12/2011 Echo: EF 30%, diff HK, mild LVH, Gr 1 DD, mildly dil LA. b. 2/2 NICM.  Marland Kitchen Microcytic anemia     a. Small capsule endoscopy 2012 with gastric erosion and small colonic AVMs. b. EGD and colonoscopy before that in 12/11 did not reveal source of bleeding)  . Gastric ulcer   . NICM (nonischemic cardiomyopathy)     a.12/2011: s/p SJM [id number] single lead ICD, Ser #: [id number]  . Obstructive sleep apnea     on CPAP  . GERD (gastroesophageal reflux disease)   . HTN (hypertension)   . Gout   . Depression   . Colitis 2/11  . Chronic back pain   . Chronic knee pain   . DVT (deep venous thrombosis)     right leg  . Anxiety   . Rheumatoid arthritis(714.0)     "shoulders & knees"  . Sleep disturbance     07/14/11 "haven't slept in 10 months; since going to [facility]"  . Hx: UTI (urinary tract infection)   . Nocturia   . COPD (chronic obstructive pulmonary disease)   . Bronchitis   . PTSD (post-traumatic stress disorder)   . Schizophrenia   . Dysmenorrhea   . Morbid obesity   . Acute renal insufficiency     a. ?Cardiorenal  09/2012.  Marland Kitchen Hyperglycemia   . Automatic implantable cardioverter-defibrillator in situ   . Chronic diastolic HF (heart failure)     a. ECHO (05/2013) EF 50-55%, RV dilated and [facility]   Past Surgical History  Procedure Laterality Date  . Cardiac valve replacement  12/2008    cardiac  . Cardiac catheterization  04/20/2009, 11/04/2008  . Right knee arthroscopy  06/11/2004  . Extraction of teeth  12/04/2008  . Right mini thoracotomy for mitral valve repair and closure of [place]  12/18/2008  . Cardiac defibrillator placement     family history includes Alcoholism in her father; Hypertension in her sister; Other in her mother. There is no history of Colon cancer. Current Outpatient Prescriptions  Medication Sig  Dispense Refill  . albuterol (PROAIR HFA) 108 (90 BASE) MCG/ACT inhaler Inhale 2 puffs into the lungs every 6 (six) hours as needed for wheezing or shortness of breath.  6.7 g  2  . ALPRAZolam (XANAX) 0.5 MG tablet Take 1 tablet (0.5 mg total) by mouth at bedtime.  14 tablet  1  . amitriptyline (ELAVIL) 150 MG tablet Take 1 tablet (150 mg total) by mouth at bedtime.  30 tablet  5  . atorvastatin (LIPITOR) 40 MG tablet Take 1 tablet (40 mg total) by mouth daily.  30 tablet  11  . carvedilol (COREG) 12.5 MG tablet TAKE ONE & ONE-HALF TABLETS BY MOUTH TWICE DAILY  90 tablet  3  . clonazePAM (KLONOPIN) 0.25 MG disintegrating tablet Take 1 tablet (0.25 mg total) by mouth daily as needed for seizure.  14 tablet  0  . diclofenac sodium (VOLTAREN) 1 % GEL Apply 2 g topically 4 (four) times daily.  100 g  2  . febuxostat (ULORIC) 40 MG tablet Take 80 mg by mouth daily.      . ferrous sulfate 325 (65 FE) MG tablet Take 1 tablet (325 mg total) by mouth 3 (three) times daily with meals.  90 tablet  3  . gabapentin (NEURONTIN) 100 MG capsule Take 1 capsule (100 mg total) by mouth 3 (three) times daily.  90 capsule  0  . glucose blood (FREESTYLE LITE) test strip Use as instructed  100 each  12  .  hydrALAZINE (APRESOLINE) 50 MG tablet TAKE ONE TABLET BY MOUTH THREE TIMES DAILY  90 tablet  3  . HYDROcodone-acetaminophen (NORCO) 10-325 MG per tablet Take 1 tablet by mouth every 6 (six) hours as needed for severe pain.  120 tablet  0  . Insulin Detemir (LEVEMIR) 100 UNIT/ML Pen Inject 50 Units into the skin daily at 10 pm.  45 mL  1  . isosorbide mononitrate (IMDUR) 60 MG 24 hr tablet Take 90 mg by mouth daily.      . Magnesium Oxide 400 MG CAPS Take 1 capsule (400 mg total) by mouth 2 (two) times daily.  4 capsule  0  . methotrexate (RHEUMATREX) 2.5 MG tablet Take 5 mg by mouth 3 (three) times a week.       . metolazone (ZAROXOLYN) 2.5 MG tablet Take 1 tablet (2.5 mg total) by mouth once a week. Every Tuesday  90 tablet  3  . nystatin (MYCOSTATIN/NYSTOP) 100000 UNIT/GM POWD Apply 1 Bottle topically 2 (two) times daily.  30 g  0  . omeprazole (PRILOSEC) 40 MG capsule Take 1 capsule (40 mg total) by mouth daily.  30 capsule  3  . polyethylene glycol (MIRALAX / GLYCOLAX) packet Take 17 g by mouth daily as needed for moderate constipation.  14 each  1  . potassium chloride SA (K-DUR,KLOR-CON) 20 MEQ tablet Take 2 tablets (40 mEq total) by mouth 2 (two) times daily. Take on extra 20 meq on Tuesday with Metolazone      . predniSONE (DELTASONE) 20 MG tablet Take 1 tablet (20 mg total) by mouth daily.  3 tablet  0  . Rivaroxaban (XARELTO) 20 MG TABS tablet Take 20 mg by mouth every morning.      Marland Kitchen spironolactone (ALDACTONE) 25 MG tablet Take 25 mg by mouth daily.      Marland Kitchen spironolactone (ALDACTONE) 25 MG tablet TAKE ONE TABLET BY MOUTH ONCE DAILY  30 tablet  3  . torsemide (DEMADEX) 100 MG tablet Take 1 tablet (  100 mg total) by mouth 2 (two) times daily.  180 tablet  3  . [DISCONTINUED] QUEtiapine (SEROQUEL) 100 MG tablet Take 400 mg by mouth at bedtime.        No current facility-administered medications for this visit.   Allergies as of 02/13/2014 - Review Complete 02/13/2014  Allergen Reaction  Noted  . Risperidone Other (See Comments) 03/21/2011  . Colchicine Hives and Itching 11/13/2006  . Morphine Itching 05/21/2010  . Shrimp [shellfish allergy] Hives and Itching 01/25/2012    reports that she quit smoking about 5 years ago. Her smoking use included Cigarettes. She has a 13.5 pack-year smoking history. She has never used smokeless tobacco. She reports that she does not drink alcohol or use illicit drugs.   Review of Systems: Pertinent positive and negative review of systems were noted in the above HPI section. All other review of systems were otherwise negative.  Vital signs were reviewed in today's medical record Physical Exam: General: Obese female examined while sitting in a wheelchair Skin: anicteric Head: Normocephalic and atraumatic Eyes:  sclerae anicteric, EOMI Ears: Normal auditory acuity Mouth: No deformity or lesions Neck: Supple, no masses or thyromegaly Lungs: Clear throughout to auscultation Heart: Regular rate and rhythm; no murmurs, rubs or bruits Abdomen: Soft, non tender and non distended. No masses, hepatosplenomegaly or hernias noted. Normal Bowel sounds Rectal:deferred Musculoskeletal: Symmetrical with no gross deformities  Skin: No lesions on visible extremities Pulses:  Normal pulses noted Extremities: No clubbing, cyanosis, edema or deformities noted Neurological: Alert oriented x 4, grossly nonfocal Cervical Nodes:  No significant cervical adenopathy Inguinal Nodes: No significant inguinal adenopathy Psychological:  Alert and cooperative. Normal mood and affect  See Assessment and Plan under Problem List

## 2014-02-14 ENCOUNTER — Encounter: Payer: Self-pay | Admitting: Licensed Clinical Social Worker

## 2014-02-14 NOTE — Telephone Encounter (Signed)
CSW placed call to [facility].  Agency able to schedule pt this Thursday at 1500 with therapist.  Agency also has psychiatry on staff.  Pt scheduled for 10/8 at 1500.  CSW placed call to Ms. Gaynell Face.  Pt provided address and information to [facility], notified to arrive 15 min prior to appt.  Letter mailed with information as well.

## 2014-02-17 ENCOUNTER — Telehealth: Payer: Self-pay | Admitting: *Deleted

## 2014-02-17 ENCOUNTER — Encounter: Payer: Self-pay | Admitting: Cardiology

## 2014-02-17 ENCOUNTER — Telehealth (HOSPITAL_COMMUNITY): Payer: Self-pay | Admitting: Vascular Surgery

## 2014-02-17 NOTE — Telephone Encounter (Signed)
[name] called pt is up 14 lbs 378 lbs today... Pt had Iv lasix on thursday took her down 364.Marland Kitchen Over the weekend weight is back up.[name] thinks she needs to be seen.. Please advise

## 2014-02-17 NOTE — Telephone Encounter (Signed)
Beth with [facility] [phone] called to check on paperwork rolling walker - braces not working and seat in w/c is broken. Beth states it is a safety problem. Ellwood Dense is to check with [facility] DME about those two items today.Pt states never heard about appt [facility] Mosetta Putt states she talked with pt and also sent her a letter about psy referral. 02/20/14 3PM. Stanton Kidney Oliviana Mcgahee RN 02/17/14 1:20PM

## 2014-02-17 NOTE — Telephone Encounter (Signed)
Spoke w/Beth she states pt wt is back up, pt is drinking about 3 L daily and eating what she wants, appt sch for Wed 02/19/14 at 9:20 with Dr Shirlee Latch will try to arrange transportation

## 2014-02-17 NOTE — Telephone Encounter (Signed)
Will arrange Taxi to get pt to and from appt

## 2014-02-19 ENCOUNTER — Ambulatory Visit (HOSPITAL_COMMUNITY)
Admission: RE | Admit: 2014-02-19 | Discharge: 2014-02-19 | Disposition: A | Discharge status: Home / Self Care | Payer: Medicare HMO | Source: Ambulatory Visit | Attending: Internal Medicine | Admitting: Internal Medicine

## 2014-02-19 VITALS — BP 128/72 | HR 102 | Wt 364.2 lb

## 2014-02-19 DIAGNOSIS — E8770 Fluid overload, unspecified: Secondary | ICD-10-CM | POA: Insufficient documentation

## 2014-02-19 DIAGNOSIS — N189 Chronic kidney disease, unspecified: Secondary | ICD-10-CM | POA: Insufficient documentation

## 2014-02-19 DIAGNOSIS — I5032 Chronic diastolic (congestive) heart failure: Secondary | ICD-10-CM | POA: Insufficient documentation

## 2014-02-19 DIAGNOSIS — Z794 Long term (current) use of insulin: Secondary | ICD-10-CM | POA: Diagnosis not present

## 2014-02-19 DIAGNOSIS — I429 Cardiomyopathy, unspecified: Secondary | ICD-10-CM | POA: Insufficient documentation

## 2014-02-19 DIAGNOSIS — Z87891 Personal history of nicotine dependence: Secondary | ICD-10-CM | POA: Insufficient documentation

## 2014-02-19 DIAGNOSIS — I340 Nonrheumatic mitral (valve) insufficiency: Secondary | ICD-10-CM | POA: Diagnosis not present

## 2014-02-19 DIAGNOSIS — Z952 Presence of prosthetic heart valve: Secondary | ICD-10-CM | POA: Diagnosis not present

## 2014-02-19 DIAGNOSIS — E119 Type 2 diabetes mellitus without complications: Secondary | ICD-10-CM | POA: Diagnosis not present

## 2014-02-19 DIAGNOSIS — I428 Other cardiomyopathies: Secondary | ICD-10-CM

## 2014-02-19 DIAGNOSIS — I129 Hypertensive chronic kidney disease with stage 1 through stage 4 chronic kidney disease, or unspecified chronic kidney disease: Secondary | ICD-10-CM | POA: Diagnosis not present

## 2014-02-19 LAB — BASIC METABOLIC PANEL
Anion gap: 11 (ref 5–15)
BUN: 21 mg/dL (ref 6–23)
CALCIUM: 9.1 mg/dL (ref 8.4–10.5)
CO2: 32 mEq/L (ref 19–32)
Chloride: 98 mEq/L (ref 96–112)
Creatinine, Ser: 1.2 mg/dL — ABNORMAL HIGH (ref 0.50–1.10)
GFR calc Af Amer: 59 mL/min — ABNORMAL LOW (ref >90)
GFR, EST NON AFRICAN AMERICAN: 51 mL/min — AB (ref >90)
GLUCOSE: 103 mg/dL — AB (ref 70–99)
Potassium: 3.9 mEq/L (ref 3.7–5.3)
SODIUM: 141 meq/L (ref 137–147)

## 2014-02-19 NOTE — Progress Notes (Signed)
Patient ID: [name], female   DOB: 07/22/1960, 53 y.o.   MRN: [id number] PCP: Dr. Loma Newton (Internal Medicine)  53 yo with CHF probably secondary to severe MR now s/p MV repair, rheumatoid arthritis, schizophrenia, overdose of seroquel 2013, S/P St Jude ICD 2013, and PUD.  EF initially low, but most recent echo in 1/15 showed EF 50-55%, stable MV repair, RV dilated and hypokinetic but difficult images. Newly diagnosed DM2, Hgb A1C 13% in April 2015. CKD (Cr 1.4-1.7)  Follow for Heart Failure: Continues to follow with paramedicine.  Being evaluated in the bariatric program, hopefully will have gastric sleeve operation.  Weight is up about 7 lbs, forgot to take metolazone this week.  Had some rectal bleeding and saw GI, probably hemorrhoids.  She is walking around house without dyspnea but is very limited by hip, back, knee pain.  She sleeps on 3 pillows chronically.    Labs (11/14): K 4.1 Creatinine 1.96  Labs (1/15): K 3.2, creatinine 2.28 Labs (2/15): K 3.6, creatinine 2.05 Labs (08/26/13): K 3.0 Creatinine 1.6 Labs (11/22/13): K 3.6, creatinine 1.0 Labs (12/04/13): K 3.4, creatinine 2.34 Labs 12/09/13): K 3.8 Cr 1.64 Labs (9/15): K 3.9, creatinine 1.24  Allergies:  1) ! * Colchicine  2) ! Morphine   Past History:  1. Congestive heart failure, most likely secondary to severe mitral regurgitation. TEE (6/10): EF 40%, diffuse hypokinesis, mild to moderately dilated LV, severe eccentric MR, small PFO. Cardiac MRI (6/10): EF 37% with global hypokinesis, moderate to severe MR, no delayed enhancement (no evidence for sarcoidosis or other infiltrative disease). TTE (10/10) after MV repair: EF 25-30%, moderately dilated LV, moderate diastolic dysfunction, mildly depressed RV function, trivial MR, MV mean gradient 5 mmHg, PASP 30 mmHg. RHC (12/10): mean RA 19, PA 46/26, mean PCWP 28, CI 2.5, SVO2 63%. TTE (2/11): EF 35-40% with diffuse hypokinesis, no significant MR or MS. TTE (7/11): EF 45%, mild  global hypokinesis, s/p MV repair, no mitral regurgitation, minimal mitral stenosis, PA systolic pressure 40 mmHg, mild LV dilation.  TTE (11/12): EF 30-35%, mild LV dilation, mild LVH, s/p MV repair with no regurgitation and minimal stenosis.  TTE (3/13): EF 25-30%, diffuse hypokinesis, no significant mitral stenosis by pressure halftime with mean gradient 7 mmHg across valve, no MR.  Echo (6/13): EF 30-35%, mildly dilated LV, mild mitral stenosis but no MR.  St Jude ICD placed in 8/13. RHC (5/14): mean RA 10, PA 23/10, mean PCWP 17, CI 2.17.  Echo (11/14) with EF 40%, stable repaired MV, mild to moderately dilated RV with mildly decreased systolic function.  Echo (1/15) with EF 50-55%, s/p MV repair with no MS or significant MR, RV poorly visualized but appears hypokinetic and dilated.  2. Severe mitral regurgitation (see above). Minimally invasive mitral valve repair on 12/18/08. She additionally had TV repair and PFO closure.  3. Microcytic anemia: EGD and colonoscopy in 12/11 did not reveal source of bleeding.  4. H/o gastric ulcers.  5. Morbid obesity.  6. Obstructive sleep apnea, on CPAP.  7. GERD.  8. Hypertension, controlled.  9. Rheumatoid arthritis  10. Gout.  11. Depression.  12. LHC (6/10): No angiographic CAD. Mildly dilated LV. 3+ MR. EF 40%.  13. Prior smoking, quit  14. Colitis (2/11)  15. Schizophrenia versus schizoaffective disorder 16. H/o DVT: On Xarelto 17. Syncope while coughing.  18. DM 19. Hemorrhoidal bleeding  Family History:  Negative for cardiac or renal disease.  No FH of Colon Cancer  Social History:  Single, 5 children. Unemployed.  Tobacco Use - Yes. Smoked < 1 ppd, quit 6/10.  Alcohol Use - no  Regular Exercise - no  Drug Use - no, hx crack-cocaine use  Daily Caffeine Use: 2 daily   Review of Systems  All systems reviewed and negative except as per HPI.  Current Outpatient Prescriptions  Medication Sig Dispense Refill  . ALPRAZolam (XANAX) 0.5 MG  tablet Take 1 tablet (0.5 mg total) by mouth at bedtime.  14 tablet  1  . amitriptyline (ELAVIL) 150 MG tablet Take 1 tablet (150 mg total) by mouth at bedtime.  30 tablet  5  . atorvastatin (LIPITOR) 40 MG tablet Take 1 tablet (40 mg total) by mouth daily.  30 tablet  11  . carvedilol (COREG) 12.5 MG tablet TAKE ONE & ONE-HALF TABLETS BY MOUTH TWICE DAILY  90 tablet  3  . clonazePAM (KLONOPIN) 0.25 MG disintegrating tablet Take 1 tablet (0.25 mg total) by mouth daily as needed for seizure.  14 tablet  0  . diclofenac sodium (VOLTAREN) 1 % GEL Apply 2 g topically 4 (four) times daily.  100 g  2  . febuxostat (ULORIC) 40 MG tablet Take 80 mg by mouth daily.      . ferrous sulfate 325 (65 FE) MG tablet Take 1 tablet (325 mg total) by mouth 3 (three) times daily with meals.  90 tablet  3  . gabapentin (NEURONTIN) 100 MG capsule Take 1 capsule (100 mg total) by mouth 3 (three) times daily.  90 capsule  0  . glucose blood (FREESTYLE LITE) test strip Use as instructed  100 each  12  . hydrALAZINE (APRESOLINE) 50 MG tablet TAKE ONE TABLET BY MOUTH THREE TIMES DAILY  90 tablet  3  . HYDROcodone-acetaminophen (NORCO) 10-325 MG per tablet Take 1 tablet by mouth every 6 (six) hours as needed for severe pain.  120 tablet  0  . Insulin Detemir (LEVEMIR) 100 UNIT/ML Pen Inject 50 Units into the skin daily at 10 pm.  45 mL  1  . isosorbide mononitrate (IMDUR) 60 MG 24 hr tablet Take 90 mg by mouth daily.      . Magnesium Oxide 400 MG CAPS Take 1 capsule (400 mg total) by mouth 2 (two) times daily.  4 capsule  0  . methotrexate (RHEUMATREX) 2.5 MG tablet Take 5 mg by mouth 3 (three) times a week.       . metolazone (ZAROXOLYN) 2.5 MG tablet Take 5 mg by mouth once a week. Every Tuesday      . nystatin (MYCOSTATIN/NYSTOP) 100000 UNIT/GM POWD Apply 1 Bottle topically 2 (two) times daily.  30 g  0  . omeprazole (PRILOSEC) 40 MG capsule Take 1 capsule (40 mg total) by mouth daily.  30 capsule  3  . polyethylene  glycol (MIRALAX / GLYCOLAX) packet Take 17 g by mouth daily as needed for moderate constipation.  14 each  1  . potassium chloride SA (K-DUR,KLOR-CON) 20 MEQ tablet Take 2 tablets (40 mEq total) by mouth 2 (two) times daily. Take on extra 20 meq on Tuesday with Metolazone      . predniSONE (DELTASONE) 20 MG tablet Take 1 tablet (20 mg total) by mouth daily.  3 tablet  0  . PROAIR HFA 108 (90 BASE) MCG/ACT inhaler INHALE TWO PUFFS BY MOUTH EVERY 6 HOURS AS NEEDED FOR  WHEEZING  OR  SHORTNESS  OF  BREATH  9 each  0  . Rivaroxaban (XARELTO) 20  MG TABS tablet Take 20 mg by mouth every morning.      Marland Kitchen spironolactone (ALDACTONE) 25 MG tablet Take 25 mg by mouth daily.      Marland Kitchen spironolactone (ALDACTONE) 25 MG tablet TAKE ONE TABLET BY MOUTH ONCE DAILY  30 tablet  3  . torsemide (DEMADEX) 100 MG tablet Take 1 tablet (100 mg total) by mouth 2 (two) times daily.  180 tablet  3  . [DISCONTINUED] QUEtiapine (SEROQUEL) 100 MG tablet Take 400 mg by mouth at bedtime.        No current facility-administered medications for this encounter.   Filed Vitals:   02/19/14 0943  BP: 128/72  Pulse: 102  Weight: 364 lb 4 oz (165.223 kg)  SpO2: 98%   General: NAD, obese. Sitting in WC Neck: Thick neck, JVP 8-9 cm, no thyromegaly or thyroid nodule.  Lungs:Clear  CV: Nondisplaced PMI.  Heart regular S1/S2, no S3/S4, no murmur.   No carotid bruit.  Normal pedal pulses.  Abdomen: Soft, markedly obese. Nontender, mild distention.  Neurologic: Alert and oriented x 3. Moves all 4 without difficulty Psych: Normal affect. Extremities: No clubbing or cyanosis. 1+ edema 1/2 up lower legs bilaterally.   Assessment/Plan  1. Chronic diastolic CHF:  Nonischemic cardiomyopathy, EF recovered now 50-55% but RV dilated with hypokinesis (echo 1/15).  St Jude ICD.  NYHA class II-III symptoms (chronic, stable).  She is a bit volume overloaded today, forgot to take metolazone this week.  - Take metolazone today before torsemide (has  not had am torsemide yet), then take metolazone again on Friday.  After that, restart metolazone once a week on Tuesdays.  - Continue current Coreg, imdur, hydralazine and spironolactone - She is not on Ace or Arb due to elevated creatinine - BMET today. - We will measure her chest for possible Cardiomems placement.  - Continue paramedicine 2. CKD: Stable, BMET today.  3. Morbid obesity: Evaluation ongoing in bariatric program, hope for gastric sleeve.    4. DVT: Suspected DVT 11/14 admission.  She is on Xarelto, would probably continue long-term given her inactivity.  5. Mitral valve repair: Stable on 1/15 echo.  6. DM:  Per IM  [name] MD  10:25 AM 02/19/2014

## 2014-02-19 NOTE — Patient Instructions (Signed)
Take Metolazone today and on Friday, then resume taking every Tuesday  Lab today  Your physician recommends that you schedule a follow-up appointment in: 1 month

## 2014-02-21 ENCOUNTER — Telehealth: Payer: Self-pay | Admitting: *Deleted

## 2014-02-21 NOTE — Telephone Encounter (Signed)
[name], calls and ask if they can place una boots due to both of pt's lower legs weeping, she states because of pt's weight gain her legs are very edematous and draining fluid, may they place the boots?

## 2014-02-22 NOTE — Telephone Encounter (Signed)
Message left saying that unna boots should be fine to apply for the patient even with her weeping legs. Care should be taken to exchange the dressing regularly given the drainage. Patient should come and receive evaluation in the clinic if there is any associated erythema, warmth, or purulent drainage. Also, patient should also seek medical attention for any excessive weight gain recently and/or dyspnea.

## 2014-02-24 ENCOUNTER — Telehealth: Payer: Self-pay | Admitting: *Deleted

## 2014-02-24 NOTE — Telephone Encounter (Signed)
Results will be called to clinic

## 2014-02-24 NOTE — Telephone Encounter (Signed)
Call from [place], RN  with [facility] - # [phone]  Pt is c/o of stinging and burring with urination.  She reports being incontinent of urine and has low back pain. Nurse is requesting a Verbal Order for U/A Tried calling [name] back for more info but not able to reach.  Can I call in for the urinalysis? Please advise.

## 2014-02-24 NOTE — Telephone Encounter (Signed)
[name] informed 

## 2014-02-24 NOTE — Telephone Encounter (Signed)
Can we call in a UA and ask the lab to hold urine, if UA positive send for culture?   Thanks  EBM

## 2014-02-26 ENCOUNTER — Other Ambulatory Visit: Payer: Self-pay | Admitting: *Deleted

## 2014-02-26 ENCOUNTER — Encounter: Payer: Medicare HMO | Attending: General Surgery | Admitting: Dietician

## 2014-02-26 ENCOUNTER — Telehealth: Payer: Self-pay | Admitting: Internal Medicine

## 2014-02-26 DIAGNOSIS — Z713 Dietary counseling and surveillance: Secondary | ICD-10-CM | POA: Diagnosis not present

## 2014-02-26 DIAGNOSIS — Z6841 Body Mass Index (BMI) 40.0 and over, adult: Secondary | ICD-10-CM | POA: Insufficient documentation

## 2014-02-26 DIAGNOSIS — N3090 Cystitis, unspecified without hematuria: Secondary | ICD-10-CM

## 2014-02-26 MED ORDER — CIPROFLOXACIN HCL 250 MG PO TABS: 250.0000 mg | ORAL_TABLET | Freq: Two times a day (BID) | ORAL | Status: AC

## 2014-02-26 MED ORDER — INSULIN DETEMIR 100 UNIT/ML FLEXPEN: 50.0000 [IU] | PEN_INJECTOR | Freq: Every day | SUBCUTANEOUS | Status: AC

## 2014-02-26 MED ORDER — SULFAMETHOXAZOLE-TRIMETHOPRIM 400-80 MG PO TABS
1.0000 | ORAL_TABLET | Freq: Two times a day (BID) | ORAL | Status: DC
Start: 2014-02-26 — End: 2014-02-26

## 2014-02-26 NOTE — Telephone Encounter (Signed)
UA appears concerning for UTI.  Will prescribe ciprofloxacin for 3 days and await culture.  Bactrim interacts with MTX, which she is on.   Can you call patient and let her know to pick up antibiotic?   Thanks  EBM

## 2014-02-26 NOTE — Telephone Encounter (Signed)
Pt request refill of insulin, already completed

## 2014-02-26 NOTE — Progress Notes (Signed)
Supervised Weight Loss:  Appt start time: 405 end time:  420.  7th SWL visit: Rihana returns today for her 7th SWL visit. She has been practicing not drinking while eating. Also trying to comply with fluid restriction. Galen has been seeing psychiatrist for meds to help her sleep, as she gets thirsty during the night. Has stopped drinking sodas and only drinking water with crystal light.   Wt Readings from Last 3 Encounters:  02/26/14 372 lb 4.8 oz (168.874 kg)  02/19/14 364 lb 4 oz (165.223 kg)  02/13/14 379 lb (171.913 kg)   Ht Readings from Last 3 Encounters:  02/13/14 5\' 6"  (1.676 m)  02/10/14 5\' 6"  (1.676 m)  01/27/14 5\' 6"  (1.676 m)   Body mass index is 60.12 kg/(m^2). @BMIFA @ Normalized weight-for-age data available only for age 58 to 20 years. Normalized stature-for-age data available only for age 58 to 20 years.   Preferred Learning Style:  No preference indicated   Learning Readiness:  Ready  MEDICATIONS: see list   DIETARY INTAKE:  24-hr recall:  B ( AM): flavored oatmeal  Snk ( AM): none  L ( PM): leftovers from the night before Snk ( PM): none D ( PM): fish, potatoes, green beans Snk ( PM): apples and vinegar  Beverages: Crystal Light, ice, coffee  Usual physical activity: walking outside for more than 5 minutes every day  Estimated energy needs: 1600 calories  Progress Towards Goal(s):  Some progress.   Nutritional Diagnosis:  [place]-3.3 Overweight/obesity related to past poor dietary habits and physical inactivity as evidenced by patient w/ pending Gastric Sleeve surgery following dietary guidelines for continued weight loss.     Intervention:  Nutrition counseling provided. Reviewed pre-op goals and protein shakes per patient request.  Teaching Method Utilized: Visual Auditory   Barriers to learning/adherence to lifestyle change: physical disability  Demonstrated degree of understanding via:  Teach Back   Monitoring/Evaluation:  Dietary  intake, exercise, and body weight in 4 week(s).

## 2014-02-26 NOTE — Patient Instructions (Signed)
-  Continue to limit sweets and portion sizes -Keep walking! Increase as tolerated -Look into getting another set of dentures

## 2014-02-27 ENCOUNTER — Encounter (HOSPITAL_COMMUNITY): Payer: Medicare Other

## 2014-02-27 ENCOUNTER — Telehealth: Payer: Self-pay | Admitting: *Deleted

## 2014-02-27 ENCOUNTER — Other Ambulatory Visit (HOSPITAL_COMMUNITY): Payer: Self-pay | Admitting: Internal Medicine

## 2014-02-27 ENCOUNTER — Other Ambulatory Visit: Payer: Self-pay | Admitting: *Deleted

## 2014-02-27 NOTE — Telephone Encounter (Signed)
Call from pt-currently being treated for UTI.  Picked up two different meds from pharmacy Cipro and Bactrim- would like to know if she's to take both, please advise.[name], Tasha Cassady10/15/20153:01 PM  Spoke with DrMullen, pt is not to take the Bactrim as it can interact with  MTX.  Pt instructed to only take the Cipro.  Pt verbalized understanding, but is now requesting a refill on her omeprazole, pt informed that I will send request to pcp to review.Tasha Spittle Cassady10/15/20153:13 PM

## 2014-02-28 ENCOUNTER — Encounter: Payer: Self-pay | Admitting: Cardiology

## 2014-03-01 MED ORDER — OMEPRAZOLE 40 MG PO CPDR: 40.0000 mg | DELAYED_RELEASE_CAPSULE | Freq: Every day | ORAL | Status: AC

## 2014-03-03 ENCOUNTER — Telehealth: Payer: Self-pay | Admitting: Internal Medicine

## 2014-03-03 ENCOUNTER — Telehealth: Payer: Self-pay | Admitting: *Deleted

## 2014-03-03 NOTE — Telephone Encounter (Signed)
[name] informed and will send [place] results.

## 2014-03-03 NOTE — Telephone Encounter (Signed)
Urinary symptoms that persist or recur within a week or two of treatment for uncomplicated cystitis suggest infection with an antimicrobial-resistant strain or, rarely, relapse. In such women, a urine culture should be performed and treatment should be initiated with a broader-spectrum antimicrobial agent.  It is concerning that she did not respond to a rather broad spectrum flouroquinolone.  Please obtain a urinalysis and urine culture to help guide antibiotic therapy, if it turns out to be necessary.  Thank You.

## 2014-03-03 NOTE — Telephone Encounter (Signed)
Call from [facility] with [facility]  -  # [phone]  Nurse saw pt today and states pt was treated for UTI on 10/14 .   She has finished antibiotic but is still c/o burring on urination. Also c/o dizziness over night.   Would you like Home Health to do a repeat U/A?  Pt treated with Cipro 250 mg take one bid # 6 given on 10/14

## 2014-03-03 NOTE — Telephone Encounter (Signed)
New message   Home health calling    Patient  Hr irregular today,. dizziness over night .

## 2014-03-04 NOTE — Telephone Encounter (Signed)
LMOVM for Beth to return call.

## 2014-03-04 NOTE — Telephone Encounter (Signed)
Forwarding to device clinic to review transmission

## 2014-03-04 NOTE — Telephone Encounter (Signed)
Apologized to [facility] for not getting back to her yesterday. Wanted [place] to know that patient experienced some dizziness the night before last and she states that patient has a "somewhat irregular HR", but "she doesn't think it was AFib". The patient has a had a recent UTI, so she isn't sure if this is related or not, but wanted [place] to be aware. She sent a transmission. Informed her that I would look at transmission and call back if abnormal findings. She is agreeable.

## 2014-03-04 NOTE — Telephone Encounter (Signed)
Transmission received and given to MD nurse.

## 2014-03-04 NOTE — Telephone Encounter (Signed)
Spoke with nurse Beth and informed her that we did not get transmission. She stated she will try again today b/w 1-2 PM. I informed her that if pt had [employer] for her phone company that she would have to have a phone filter. She stated that she would find out this information and let me know. Marland Kitchen

## 2014-03-04 NOTE — Telephone Encounter (Signed)
Per device, nothing on transmission.

## 2014-03-05 ENCOUNTER — Other Ambulatory Visit: Payer: Self-pay | Admitting: *Deleted

## 2014-03-05 DIAGNOSIS — M25561 Pain in right knee: Secondary | ICD-10-CM

## 2014-03-05 DIAGNOSIS — M25562 Pain in left knee: Principal | ICD-10-CM

## 2014-03-05 DIAGNOSIS — M1710 Unilateral primary osteoarthritis, unspecified knee: Secondary | ICD-10-CM

## 2014-03-06 ENCOUNTER — Encounter: Payer: Self-pay | Admitting: Cardiology

## 2014-03-06 MED ORDER — HYDROCODONE-ACETAMINOPHEN 10-325 MG PO TABS: 1.0000 | ORAL_TABLET | Freq: Four times a day (QID) | ORAL | Status: DC | PRN

## 2014-03-06 NOTE — Assessment & Plan Note (Signed)
Patient requesting refill of vicodin. Last refill (#120) given on January 06, 2014. Patient is on a pain contract.   - Will check on status of bariatric surgery at next visit.   - Refill of vicodin 10/325 mg Q6H PRN #120

## 2014-03-06 NOTE — Telephone Encounter (Signed)
Rx ready - pt called; message left. 

## 2014-03-07 ENCOUNTER — Other Ambulatory Visit: Payer: Self-pay | Admitting: *Deleted

## 2014-03-10 ENCOUNTER — Other Ambulatory Visit: Payer: Self-pay | Admitting: *Deleted

## 2014-03-10 ENCOUNTER — Encounter: Payer: Self-pay | Admitting: Internal Medicine

## 2014-03-10 DIAGNOSIS — R102 Pelvic and perineal pain: Secondary | ICD-10-CM

## 2014-03-10 MED ORDER — DICLOFENAC SODIUM 1 % TD GEL: 2.0000 g | Freq: Four times a day (QID) | TRANSDERMAL | Status: DC

## 2014-03-10 NOTE — Telephone Encounter (Signed)
Rx called in to pharmacy. Stanton Kidney Kayleanna Lorman RN 03/10/14 4PM

## 2014-03-11 MED ORDER — NYSTATIN 100000 UNIT/GM EX POWD: 1.0000 | Freq: Two times a day (BID) | CUTANEOUS | Status: AC

## 2014-03-12 ENCOUNTER — Ambulatory Visit (INDEPENDENT_AMBULATORY_CARE_PROVIDER_SITE_OTHER): Payer: Medicare HMO | Admitting: Internal Medicine

## 2014-03-12 ENCOUNTER — Encounter: Payer: Self-pay | Admitting: Internal Medicine

## 2014-03-12 VITALS — BP 120/71 | HR 93 | Temp 98.1°F | Ht 66.0 in | Wt 382.7 lb

## 2014-03-12 DIAGNOSIS — R600 Localized edema: Secondary | ICD-10-CM

## 2014-03-12 DIAGNOSIS — Z0000 Encounter for general adult medical examination without abnormal findings: Secondary | ICD-10-CM

## 2014-03-12 DIAGNOSIS — F419 Anxiety disorder, unspecified: Secondary | ICD-10-CM | POA: Insufficient documentation

## 2014-03-12 DIAGNOSIS — E1121 Type 2 diabetes mellitus with diabetic nephropathy: Secondary | ICD-10-CM

## 2014-03-12 DIAGNOSIS — I10 Essential (primary) hypertension: Secondary | ICD-10-CM

## 2014-03-12 DIAGNOSIS — S39012A Strain of muscle, fascia and tendon of lower back, initial encounter: Secondary | ICD-10-CM

## 2014-03-12 LAB — GLUCOSE, CAPILLARY: Glucose-Capillary: 141 mg/dL — ABNORMAL HIGH (ref 70–99)

## 2014-03-12 LAB — POCT GLYCOSYLATED HEMOGLOBIN (HGB A1C): Hemoglobin A1C: 6.5

## 2014-03-12 NOTE — Assessment & Plan Note (Signed)
Lab Results  Component Value Date   HGBA1C 6.5 03/12/2014   HGBA1C 7.6* 11/22/2013   HGBA1C 13.0* 08/20/2013    Assessment: Diabetes control: good control (HgbA1C at goal) Progress toward A1C goal:  at goal Comments: much improved  Plan: Medications:  continue current medications levemir 50 units daily Home glucose monitoring: Frequency:   Timing: before meals Instruction/counseling given: reminded to bring blood glucose meter & log to each visit, reminded to bring medications to each visit and discussed diet Educational resources provided:   Self management tools provided: copy of home glucose meter download

## 2014-03-12 NOTE — Assessment & Plan Note (Signed)
Urine microalbumin today Refused influenza and pneumococcal vaccines today a1c check today Foot exam on next visit

## 2014-03-12 NOTE — Progress Notes (Signed)
Subjective:   Patient ID: [name] female   DOB: May 20, 1960 53 y.o.   MRN: [id number]  HPI: [name] is a 53 y.o. female with PMH as listed below presenting to [facility] today for routine follow up visit.   DM2: A1c much improved! From 7.6 to 6.5 today. She did bring a meter today but unable to be downloaded but she says she has been working on it.   HTN: well controlled  Main complaint today mid lower back pain x2 days. Denies any trauma, worse with walking, improved when sitting down, not radiating to extremities, no loss of bowel or bladder dysfunction. We reviewed stretches at home, she is also on norco and voltaran gel  Also requesting anxiety medication however PCP has been clear in the past that no more xanax from opc. She did say she followed with psychiatry but they refused to give her medication. Anxiety mainly at night when she wears cpap mask but says she has to wear the mask in preparation for surgery (?gastric bypass). She wishes to discuss this with PCP. Does not remember who she has seen from psychiatry, on chart review [facility].   Past Medical History  Diagnosis Date  . Severe mitral regurgitation     a. Minimally invasive MV repair on 12/18/08. Additionally TV repair  and PFO closure.  . Chronic systolic CHF (congestive heart failure)     a. 12/2011 Echo: EF 30%, diff HK, mild LVH, Gr 1 DD, mildly dil LA. b. 2/2 NICM.  Marland Kitchen Microcytic anemia     a. Small capsule endoscopy 2012 with gastric erosion and small colonic AVMs. b. EGD and colonoscopy before that in 12/11 did not reveal source of bleeding)  . Gastric ulcer   . NICM (nonischemic cardiomyopathy)     a.12/2011: s/p SJM [id number] single lead ICD, Ser #: [id number]  . Obstructive sleep apnea     on CPAP  . GERD (gastroesophageal reflux disease)   . HTN (hypertension)   . Gout   . Depression   . Colitis 2/11  . Chronic back pain   . Chronic knee pain   . DVT (deep venous thrombosis)    right leg  . Anxiety   . Rheumatoid arthritis(714.0)     "shoulders & knees"  . Sleep disturbance     07/14/11 "haven't slept in 10 months; since going to [facility]"  . Hx: UTI (urinary tract infection)   . Nocturia   . COPD (chronic obstructive pulmonary disease)   . Bronchitis   . PTSD (post-traumatic stress disorder)   . Schizophrenia   . Dysmenorrhea   . Morbid obesity   . Acute renal insufficiency     a. ?Cardiorenal 09/2012.  Marland Kitchen Hyperglycemia   . Automatic implantable cardioverter-defibrillator in situ   . Chronic diastolic HF (heart failure)     a. ECHO (05/2013) EF 50-55%, RV dilated and KH   Current Outpatient Prescriptions  Medication Sig Dispense Refill  . amitriptyline (ELAVIL) 150 MG tablet Take 1 tablet (150 mg total) by mouth at bedtime.  30 tablet  5  . atorvastatin (LIPITOR) 40 MG tablet Take 1 tablet (40 mg total) by mouth daily.  30 tablet  11  . carvedilol (COREG) 12.5 MG tablet TAKE ONE & ONE-HALF TABLETS BY MOUTH TWICE DAILY  90 tablet  3  . diclofenac sodium (VOLTAREN) 1 % GEL Apply 2 g topically 4 (four) times daily.  100 g  2  . febuxostat (ULORIC) 40  MG tablet Take 80 mg by mouth daily.      . ferrous sulfate 325 (65 FE) MG tablet Take 1 tablet (325 mg total) by mouth 3 (three) times daily with meals.  90 tablet  3  . gabapentin (NEURONTIN) 100 MG capsule Take 1 capsule (100 mg total) by mouth 3 (three) times daily.  90 capsule  0  . glucose blood (FREESTYLE LITE) test strip Use as instructed  100 each  12  . hydrALAZINE (APRESOLINE) 50 MG tablet TAKE ONE TABLET BY MOUTH THREE TIMES DAILY  90 tablet  3  . HYDROcodone-acetaminophen (NORCO) 10-325 MG per tablet Take 1 tablet by mouth every 6 (six) hours as needed for severe pain.  120 tablet  0  . isosorbide mononitrate (IMDUR) 60 MG 24 hr tablet Take 90 mg by mouth daily.      . methotrexate (RHEUMATREX) 2.5 MG tablet Take 5 mg by mouth 3 (three) times a week.       . metolazone (ZAROXOLYN) 2.5 MG tablet Take 5 mg by  mouth once a week. Every Tuesday      . nystatin (MYCOSTATIN/NYSTOP) 100000 UNIT/GM POWD Apply 1 Bottle topically 2 (two) times daily.  30 g  0  . omeprazole (PRILOSEC) 40 MG capsule Take 1 capsule (40 mg total) by mouth daily.  30 capsule  3  . polyethylene glycol (MIRALAX / GLYCOLAX) packet Take 17 g by mouth daily as needed for moderate constipation.  14 each  1  . potassium chloride SA (K-DUR,KLOR-CON) 20 MEQ tablet Take 2 tablets (40 mEq total) by mouth 2 (two) times daily. Take on extra 20 meq on Tuesday with Metolazone      . PROAIR HFA 108 (90 BASE) MCG/ACT inhaler INHALE TWO PUFFS BY MOUTH EVERY 6 HOURS AS NEEDED FOR  WHEEZING  OR  SHORTNESS  OF  BREATH  9 each  0  . rivaroxaban (XARELTO) 20 MG TABS tablet Take 1 tablet (20 mg total) by mouth daily with supper.  30 tablet  3  . spironolactone (ALDACTONE) 25 MG tablet TAKE ONE TABLET BY MOUTH ONCE DAILY  30 tablet  3  . torsemide (DEMADEX) 100 MG tablet Take 1 tablet (100 mg total) by mouth 2 (two) times daily.  180 tablet  3  . ALPRAZolam (XANAX) 0.5 MG tablet Take 1 tablet (0.5 mg total) by mouth at bedtime.  14 tablet  1  . BD PEN NEEDLE NANO U/F 32G X 4 MM MISC       . Insulin Detemir (LEVEMIR) 100 UNIT/ML Pen Inject 50 Units into the skin daily at 10 pm.  45 mL  3  . Magnesium Oxide 400 MG CAPS Take 1 capsule (400 mg total) by mouth 2 (two) times daily.  4 capsule  0  . [DISCONTINUED] QUEtiapine (SEROQUEL) 100 MG tablet Take 400 mg by mouth at bedtime.        No current facility-administered medications for this visit.   Family History  Problem Relation Age of Onset  . Hypertension Sister   . Alcoholism Father     died in his 54's or 63's  . Other Mother     alive & well @ 33.  . Colon cancer Neg Hx    History   Social History  . Marital Status: Single    Spouse Name: N/A    Number of Children: 5  . Years of Education: N/A   Occupational History  . disability    Social History Main  Topics  . Smoking status: Former  Smoker -- 0.50 packs/day for 27 years    Types: Cigarettes    Quit date: 12/18/2008  . Smokeless tobacco: Never Used  . Alcohol Use: No     Comment: "stopped drinking alcohol 12/2003; did drink ~ 6 shots/wk"  . Drug Use: No     Comment: "last cocaine use 2009"  . Sexual Activity: No   Other Topics Concern  . None   Social History Narrative   Pt lives in [place] with her dtr.  On disability.   Review of Systems:  Constitutional:  Denies fever, chills  HEENT:  Denies congestion  Respiratory:  Denies SOB. Baseline DOE  Cardiovascular:  Denies chest pain. Chronic lower extremity swelling--in una boot well wrapped for now  Gastrointestinal:  Denies nausea, vomiting, abdominal pain  Genitourinary:  Denies dysuria. Frequency that is always.   Musculoskeletal:  Back pain  Skin:  Lower extremity swelling  Neurological:  Denies headache   Objective:  Physical Exam: Filed Vitals:   03/12/14 1530  BP: 120/71  Pulse: 93  Temp: 98.1 F (36.7 C)  TempSrc: Oral  Height: 5\' 6"  (1.676 m)  Weight: 382 lb 11.2 oz (173.592 kg)  SpO2: 97%   Vitals reviewed. General: sitting in wheelchair, NAD HEENT: EOMI Cardiac: distant heart sounds due to body habitus, RRR Pulm: clear to auscultation bilaterally, no wheezes, rales, or rhonchi Abd: soft, obese, nontender, nondistended, BS present Ext: b/l lower extremity edema that were wrapped up and she did not want me to remove dressing for examination. Itching under unna boots.  Neuro: alert and oriented X3  Assessment & Plan:  Discussed with Dr. 

## 2014-03-12 NOTE — Patient Instructions (Signed)
General Instructions:   Please bring your medicines with you each time you come to clinic.  Medicines may include prescription medications, over-the-counter medications, herbal remedies, eye drops, vitamins, or other pills.  Great job on your diabetes and blood pressure. However, your weight keeps going up so we need to work on diet and exercise.   Please have your home health RN call Dr. Loma Newton tomorrow for an update on the legs. If you develop worsening swelling, pain, fever, or chills let us know right away  Try the back stretches we talked about today and you can rub the voltaren gel to that area as well  We will discuss anxiety medication with Dr. Loma Newton  Progress Toward Treatment Goals:  Treatment Goal 03/12/2014  Hemoglobin A1C at goal  Blood pressure at goal  Prevent falls -    Self Care Goals & Plans:  Self Care Goal 03/12/2014  Manage my medications take my medicines as prescribed; bring my medications to every visit; refill my medications on time  Monitor my health check my feet daily; keep track of my blood glucose; bring my glucose meter and log to each visit  Eat healthy foods eat more vegetables; eat foods that are low in salt  Be physically active find an activity I enjoy; take a walk every day  Meeting treatment goals -    Home Blood Glucose Monitoring 03/12/2014  Check my blood sugar -  When to check my blood sugar before meals     Care Management & Community Referrals:  Referral 10/04/2013  Referrals made for care management support none needed  Referrals made to community resources -    Low Back Sprain with Rehab  A sprain is an injury in which a ligament is torn. The ligaments of the lower back are vulnerable to sprains. However, they are strong and require great force to be injured. These ligaments are important for stabilizing the spinal column. Sprains are classified into three categories. Grade 1 sprains cause pain, but the tendon is not lengthened.  Grade 2 sprains include a lengthened ligament, due to the ligament being stretched or partially ruptured. With grade 2 sprains there is still function, although the function may be decreased. Grade 3 sprains involve a complete tear of the tendon or muscle, and function is usually impaired. SYMPTOMS   Severe pain in the lower back.  Sometimes, a feeling of a "pop," "snap," or tear, at the time of injury.  Tenderness and sometimes swelling at the injury site.  Uncommonly, bruising (contusion) within 48 hours of injury.  Muscle spasms in the back. CAUSES  Low back sprains occur when a force is placed on the ligaments that is greater than they can handle. Common causes of injury include:  Performing a stressful act while off-balance.  Repetitive stressful activities that involve movement of the lower back.  Direct hit (trauma) to the lower back. RISK INCREASES WITH:  Contact sports (football, wrestling).  Collisions (major skiing accidents).  Sports that require throwing or lifting (baseball, weightlifting).  Sports involving twisting of the spine (gymnastics, diving, tennis, golf).  Poor strength and flexibility.  Inadequate protection.  Previous back injury or surgery (especially fusion). PREVENTION  Wear properly fitted and padded protective equipment.  Warm up and stretch properly before activity.  Allow for adequate recovery between workouts.  Maintain physical fitness:  Strength, flexibility, and endurance.  Cardiovascular fitness.  Maintain a healthy body weight. PROGNOSIS  If treated properly, low back sprains usually heal with non-surgical treatment.  The length of time for healing depends on the severity of the injury.  RELATED COMPLICATIONS   Recurring symptoms, resulting in a chronic problem.  Chronic inflammation and pain in the low back.  Delayed healing or resolution of symptoms, especially if activity is resumed too soon.  Prolonged  impairment.  Unstable or arthritic joints of the low back. TREATMENT  Treatment first involves the use of ice and medicine, to reduce pain and inflammation. The use of strengthening and stretching exercises may help reduce pain with activity. These exercises may be performed at home or with a therapist. Severe injuries may require referral to a therapist for further evaluation and treatment, such as ultrasound. Your caregiver may advise that you wear a back brace or corset, to help reduce pain and discomfort. Often, prolonged bed rest results in greater harm then benefit. Corticosteroid injections may be recommended. However, these should be reserved for the most serious cases. It is important to avoid using your back when lifting objects. At night, sleep on your back on a firm mattress, with a pillow placed under your knees. If non-surgical treatment is unsuccessful, surgery may be needed.  MEDICATION   If pain medicine is needed, nonsteroidal anti-inflammatory medicines (aspirin and ibuprofen), or other minor pain relievers (acetaminophen), are often advised.  Do not take pain medicine for 7 days before surgery.  Prescription pain relievers may be given, if your caregiver thinks they are needed. Use only as directed and only as much as you need.  Ointments applied to the skin may be helpful.  Corticosteroid injections may be given by your caregiver. These injections should be reserved for the most serious cases, because they may only be given a certain number of times. HEAT AND COLD  Cold treatment (icing) should be applied for 10 to 15 minutes every 2 to 3 hours for inflammation and pain, and immediately after activity that aggravates your symptoms. Use ice packs or an ice massage.  Heat treatment may be used before performing stretching and strengthening activities prescribed by your caregiver, physical therapist, or athletic trainer. Use a heat pack or a warm water soak. SEEK MEDICAL CARE  IF:   Symptoms get worse or do not improve in 2 to 4 weeks, despite treatment.  You develop numbness or weakness in either leg.  You lose bowel or bladder function.  Any of the following occur after surgery: fever, increased pain, swelling, redness, drainage of fluids, or bleeding in the affected area.  New, unexplained symptoms develop. (Drugs used in treatment may produce side effects.) EXERCISES  RANGE OF MOTION (ROM) AND STRETCHING EXERCISES - Low Back Sprain Most people with lower back pain will find that their symptoms get worse with excessive bending forward (flexion) or arching at the lower back (extension). The exercises that will help resolve your symptoms will focus on the opposite motion.  Your physician, physical therapist or athletic trainer will help you determine which exercises will be most helpful to resolve your lower back pain. Do not complete any exercises without first consulting with your caregiver. Discontinue any exercises which make your symptoms worse, until you speak to your caregiver. If you have pain, numbness or tingling which travels down into your buttocks, leg or foot, the goal of the therapy is for these symptoms to move closer to your back and eventually resolve. Sometimes, these leg symptoms will get better, but your lower back pain may worsen. This is often an indication of progress in your rehabilitation. Be very alert to  any changes in your symptoms and the activities in which you participated in the 24 hours prior to the change. Sharing this information with your caregiver will allow him or her to most efficiently treat your condition. These exercises may help you when beginning to rehabilitate your injury. Your symptoms may resolve with or without further involvement from your physician, physical therapist or athletic trainer. While completing these exercises, remember:   Restoring tissue flexibility helps normal motion to return to the joints. This allows  healthier, less painful movement and activity.  An effective stretch should be held for at least 30 seconds.  A stretch should never be painful. You should only feel a gentle lengthening or release in the stretched tissue. FLEXION RANGE OF MOTION AND STRETCHING EXERCISES: STRETCH - Flexion, Single Knee to Chest   Lie on a firm bed or floor with both legs extended in front of you.  Keeping one leg in contact with the floor, bring your opposite knee to your chest. Hold your leg in place by either grabbing behind your thigh or at your knee.  Pull until you feel a gentle stretch in your low back. Hold __________ seconds.  Slowly release your grasp and repeat the exercise with the opposite side. Repeat __________ times. Complete this exercise __________ times per day.  STRETCH - Flexion, Double Knee to Chest  Lie on a firm bed or floor with both legs extended in front of you.  Keeping one leg in contact with the floor, bring your opposite knee to your chest.  Tense your stomach muscles to support your back and then lift your other knee to your chest. Hold your legs in place by either grabbing behind your thighs or at your knees.  Pull both knees toward your chest until you feel a gentle stretch in your low back. Hold __________ seconds.  Tense your stomach muscles and slowly return one leg at a time to the floor. Repeat __________ times. Complete this exercise __________ times per day.  STRETCH - Low Trunk Rotation  Lie on a firm bed or floor. Keeping your legs in front of you, bend your knees so they are both pointed toward the ceiling and your feet are flat on the floor.  Extend your arms out to the side. This will stabilize your upper body by keeping your shoulders in contact with the floor.  Gently and slowly drop both knees together to one side until you feel a gentle stretch in your low back. Hold for __________ seconds.  Tense your stomach muscles to support your lower back as  you bring your knees back to the starting position. Repeat the exercise to the other side. Repeat __________ times. Complete this exercise __________ times per day  EXTENSION RANGE OF MOTION AND FLEXIBILITY EXERCISES: STRETCH - Extension, Prone on Elbows   Lie on your stomach on the floor, a bed will be too soft. Place your palms about shoulder width apart and at the height of your head.  Place your elbows under your shoulders. If this is too painful, stack pillows under your chest.  Allow your body to relax so that your hips drop lower and make contact more completely with the floor.  Hold this position for __________ seconds.  Slowly return to lying flat on the floor. Repeat __________ times. Complete this exercise __________ times per day.  RANGE OF MOTION - Extension, Prone Press Ups  Lie on your stomach on the floor, a bed will be too soft. Place your  palms about shoulder width apart and at the height of your head.  Keeping your back as relaxed as possible, slowly straighten your elbows while keeping your hips on the floor. You may adjust the placement of your hands to maximize your comfort. As you gain motion, your hands will come more underneath your shoulders.  Hold this position __________ seconds.  Slowly return to lying flat on the floor. Repeat __________ times. Complete this exercise __________ times per day.  RANGE OF MOTION- Quadruped, Neutral Spine   Assume a hands and knees position on a firm surface. Keep your hands under your shoulders and your knees under your hips. You may place padding under your knees for comfort.  Drop your head and point your tailbone toward the ground below you. This will round out your lower back like an angry cat. Hold this position for __________ seconds.  Slowly lift your head and release your tail bone so that your back sags into a large arch, like an old horse.  Hold this position for __________ seconds.  Repeat this until you feel  limber in your low back.  Now, find your "sweet spot." This will be the most comfortable position somewhere between the two previous positions. This is your neutral spine. Once you have found this position, tense your stomach muscles to support your low back.  Hold this position for __________ seconds. Repeat __________ times. Complete this exercise __________ times per day.  STRENGTHENING EXERCISES - Low Back Sprain These exercises may help you when beginning to rehabilitate your injury. These exercises should be done near your "sweet spot." This is the neutral, low-back arch, somewhere between fully rounded and fully arched, that is your least painful position. When performed in this safe range of motion, these exercises can be used for people who have either a flexion or extension based injury. These exercises may resolve your symptoms with or without further involvement from your physician, physical therapist or athletic trainer. While completing these exercises, remember:   Muscles can gain both the endurance and the strength needed for everyday activities through controlled exercises.  Complete these exercises as instructed by your physician, physical therapist or athletic trainer. Increase the resistance and repetitions only as guided.  You may experience muscle soreness or fatigue, but the pain or discomfort you are trying to eliminate should never worsen during these exercises. If this pain does worsen, stop and make certain you are following the directions exactly. If the pain is still present after adjustments, discontinue the exercise until you can discuss the trouble with your caregiver. STRENGTHENING - Deep Abdominals, Pelvic Tilt   Lie on a firm bed or floor. Keeping your legs in front of you, bend your knees so they are both pointed toward the ceiling and your feet are flat on the floor.  Tense your lower abdominal muscles to press your low back into the floor. This motion will  rotate your pelvis so that your tail bone is scooping upwards rather than pointing at your feet or into the floor. With a gentle tension and even breathing, hold this position for __________ seconds. Repeat __________ times. Complete this exercise __________ times per day.  STRENGTHENING - Abdominals, Crunches   Lie on a firm bed or floor. Keeping your legs in front of you, bend your knees so they are both pointed toward the ceiling and your feet are flat on the floor. Cross your arms over your chest.  Slightly tip your chin down without bending your neck.  Tense your abdominals and slowly lift your trunk high enough to just clear your shoulder blades. Lifting higher can put excessive stress on the lower back and does not further strengthen your abdominal muscles.  Control your return to the starting position. Repeat __________ times. Complete this exercise __________ times per day.  STRENGTHENING - Quadruped, Opposite UE/LE Lift   Assume a hands and knees position on a firm surface. Keep your hands under your shoulders and your knees under your hips. You may place padding under your knees for comfort.  Find your neutral spine and gently tense your abdominal muscles so that you can maintain this position. Your shoulders and hips should form a rectangle that is parallel with the floor and is not twisted.  Keeping your trunk steady, lift your right hand no higher than your shoulder and then your left leg no higher than your hip. Make sure you are not holding your breath. Hold this position for __________ seconds.  Continuing to keep your abdominal muscles tense and your back steady, slowly return to your starting position. Repeat with the opposite arm and leg. Repeat __________ times. Complete this exercise __________ times per day.  STRENGTHENING - Abdominals and Quadriceps, Straight Leg Raise   Lie on a firm bed or floor with both legs extended in front of you.  Keeping one leg in contact  with the floor, bend the other knee so that your foot can rest flat on the floor.  Find your neutral spine, and tense your abdominal muscles to maintain your spinal position throughout the exercise.  Slowly lift your straight leg off the floor about 6 inches for a count of 15, making sure to not hold your breath.  Still keeping your neutral spine, slowly lower your leg all the way to the floor. Repeat this exercise with each leg __________ times. Complete this exercise __________ times per day. POSTURE AND BODY MECHANICS CONSIDERATIONS - Low Back Sprain Keeping correct posture when sitting, standing or completing your activities will reduce the stress put on different body tissues, allowing injured tissues a chance to heal and limiting painful experiences. The following are general guidelines for improved posture. Your physician or physical therapist will provide you with any instructions specific to your needs. While reading these guidelines, remember:  The exercises prescribed by your provider will help you have the flexibility and strength to maintain correct postures.  The correct posture provides the best environment for your joints to work. All of your joints have less wear and tear when properly supported by a spine with good posture. This means you will experience a healthier, less painful body.  Correct posture must be practiced with all of your activities, especially prolonged sitting and standing. Correct posture is as important when doing repetitive low-stress activities (typing) as it is when doing a single heavy-load activity (lifting). RESTING POSITIONS Consider which positions are most painful for you when choosing a resting position. If you have pain with flexion-based activities (sitting, bending, stooping, squatting), choose a position that allows you to rest in a less flexed posture. You would want to avoid curling into a fetal position on your side. If your pain worsens with  extension-based activities (prolonged standing, working overhead), avoid resting in an extended position such as sleeping on your stomach. Most people will find more comfort when they rest with their spine in a more neutral position, neither too rounded nor too arched. Lying on a non-sagging bed on your side with a pillow between your  knees, or on your back with a pillow under your knees will often provide some relief. Keep in mind, being in any one position for a prolonged period of time, no matter how correct your posture, can still lead to stiffness. PROPER SITTING POSTURE In order to minimize stress and discomfort on your spine, you must sit with correct posture. Sitting with good posture should be effortless for a healthy body. Returning to good posture is a gradual process. Many people can work toward this most comfortably by using various supports until they have the flexibility and strength to maintain this posture on their own. When sitting with proper posture, your ears will fall over your shoulders and your shoulders will fall over your hips. You should use the back of the chair to support your upper back. Your lower back will be in a neutral position, just slightly arched. You may place a small pillow or folded towel at the base of your lower back for  support.  When working at a desk, create an environment that supports good, upright posture. Without extra support, muscles tire, which leads to excessive strain on joints and other tissues. Keep these recommendations in mind: CHAIR:  A chair should be able to slide under your desk when your back makes contact with the back of the chair. This allows you to work closely.  The chair's height should allow your eyes to be level with the upper part of your monitor and your hands to be slightly lower than your elbows. BODY POSITION  Your feet should make contact with the floor. If this is not possible, use a foot rest.  Keep your ears over your  shoulders. This will reduce stress on your neck and low back. INCORRECT SITTING POSTURES  If you are feeling tired and unable to assume a healthy sitting posture, do not slouch or slump. This puts excessive strain on your back tissues, causing more damage and pain. Healthier options include:  Using more support, like a lumbar pillow.  Switching tasks to something that requires you to be upright or walking.  Talking a brief walk.  Lying down to rest in a neutral-spine position. PROLONGED STANDING WHILE SLIGHTLY LEANING FORWARD  When completing a task that requires you to lean forward while standing in one place for a long time, place either foot up on a stationary 2-4 inch high object to help maintain the best posture. When both feet are on the ground, the lower back tends to lose its slight inward curve. If this curve flattens (or becomes too large), then the back and your other joints will experience too much stress, tire more quickly, and can cause pain. CORRECT STANDING POSTURES Proper standing posture should be assumed with all daily activities, even if they only take a few moments, like when brushing your teeth. As in sitting, your ears should fall over your shoulders and your shoulders should fall over your hips. You should keep a slight tension in your abdominal muscles to brace your spine. Your tailbone should point down to the ground, not behind your body, resulting in an over-extended swayback posture.  INCORRECT STANDING POSTURES  Common incorrect standing postures include a forward head, locked knees and/or an excessive swayback. WALKING Walk with an upright posture. Your ears, shoulders and hips should all line-up. PROLONGED ACTIVITY IN A FLEXED POSITION When completing a task that requires you to bend forward at your waist or lean over a low surface, try to find a way to stabilize 3  out of 4 of your limbs. You can place a hand or elbow on your thigh or rest a knee on the surface you  are reaching across. This will provide you more stability, so that your muscles do not tire as quickly. By keeping your knees relaxed, or slightly bent, you will also reduce stress across your lower back. CORRECT LIFTING TECHNIQUES DO :  Assume a wide stance. This will provide you more stability and the opportunity to get as close as possible to the object which you are lifting.  Tense your abdominals to brace your spine. Bend at the knees and hips. Keeping your back locked in a neutral-spine position, lift using your leg muscles. Lift with your legs, keeping your back straight.  Test the weight of unknown objects before attempting to lift them.  Try to keep your elbows locked down at your sides in order get the best strength from your shoulders when carrying an object.  Always ask for help when lifting heavy or awkward objects. INCORRECT LIFTING TECHNIQUES DO NOT:   Lock your knees when lifting, even if it is a small object.  Bend and twist. Pivot at your feet or move your feet when needing to change directions.  Assume that you can safely pick up even a paperclip without proper posture. Document Released: 05/02/2005 Document Revised: 07/25/2011 Document Reviewed: 08/14/2008 [facility] Patient Information 2015 [place], [place]. This information is not intended to replace advice given to you by your health care provider. Make sure you discuss any questions you have with your health care provider.

## 2014-03-12 NOTE — Assessment & Plan Note (Signed)
Claims to be chronic per patient. She currently has thick bandage in place--una boot and does not want it removed at this time for examination and it will be difficult to get it back in place. Advised to have home health RN call tomorrow to explain how the legs look, if possible any suspicion for cellulitis we would need to know and try to examine the extremities.   Advised [name] to weigh herself daily and let us know if volume up or any signs and symptoms of CHF exacrerbation, including sob, worsening lower extremity edema, doe, chest pain, N/V/D, headache. She knows signs of CHF exacerbation and will notify us but currently reports everything is at baseline.

## 2014-03-12 NOTE — Assessment & Plan Note (Signed)
BP Readings from Last 3 Encounters:  03/12/14 120/71  02/19/14 128/72  02/13/14 100/78   Lab Results  Component Value Date   NA 141 02/19/2014   K 3.9 02/19/2014   CREATININE 1.20* 02/19/2014    Assessment: Blood pressure control: controlled Progress toward BP goal:  at goal  Plan: Medications:  continue current medications coreg 18.75mg  bid, hydralazine 50mg  tid, imdur 90mg  daily, metolazone 5mg  q weekly, aldactone 5mg , and torsemide 100mg  bid

## 2014-03-12 NOTE — Assessment & Plan Note (Addendum)
Mainly at night when wearing cpap. pcp had said no more xanax from [facility] and that she needed to see psychiatry. She says she did go but was not given any medication.  Possibly [facility]?  -request records [facility] -will defer giving anxiety medication to pcp as she wishes to discuss with him. I have requested records to be found and faxed from [facility] for him to review

## 2014-03-12 NOTE — Assessment & Plan Note (Signed)
Acute onset, x2 days, denies trauma to area. Worse with movement and walking, better with rest and sitting down.   Back exercises as tolerated Already has voltaren gel and noco, take prn

## 2014-03-13 ENCOUNTER — Telehealth (HOSPITAL_COMMUNITY): Payer: Self-pay | Admitting: Vascular Surgery

## 2014-03-13 LAB — MICROALBUMIN / CREATININE URINE RATIO
Creatinine, Urine: 124.5 mg/dL
Microalb Creat Ratio: 2.4 mg/g (ref 0.0–30.0)
Microalb, Ur: 0.3 mg/dL (ref <2.0)

## 2014-03-13 NOTE — Telephone Encounter (Signed)
Beth called pt weight is weight is up 373 from Tuesday 371 .Marland Kitchen Took metolazone on Tuesday pt continues to gain weight.. Pt is not following fluid restrictions... Complaining of increased SOB when laying down, and a little chest tightness... Think she has too much fluid on board.. Please advise

## 2014-03-13 NOTE — Progress Notes (Signed)
Case discussed with Dr. Qureshi soon after the resident saw the patient.  We reviewed the resident's history and exam and pertinent patient test results.  I agree with the assessment, diagnosis, and plan of care documented in the resident's note. 

## 2014-03-13 NOTE — Telephone Encounter (Signed)
Discussed with Tonye Becket, NP she would like to give pt 80 mg IV lasix, spoke w/Beth she is aware and order faxed to [facility] at [phone]

## 2014-03-14 ENCOUNTER — Telehealth: Payer: Self-pay | Admitting: Internal Medicine

## 2014-03-14 ENCOUNTER — Encounter: Payer: Self-pay | Admitting: Internal Medicine

## 2014-03-14 ENCOUNTER — Ambulatory Visit: Payer: Medicare Other | Admitting: Pulmonary Disease

## 2014-03-14 ENCOUNTER — Telehealth: Payer: Self-pay | Admitting: *Deleted

## 2014-03-14 ENCOUNTER — Encounter: Payer: Self-pay | Admitting: Licensed Clinical Social Worker

## 2014-03-14 DIAGNOSIS — F4321 Adjustment disorder with depressed mood: Secondary | ICD-10-CM

## 2014-03-14 DIAGNOSIS — F419 Anxiety disorder, unspecified: Secondary | ICD-10-CM

## 2014-03-14 MED ORDER — ALPRAZOLAM 0.5 MG PO TABS: 0.5000 mg | ORAL_TABLET | Freq: Every day | ORAL | Status: DC

## 2014-03-14 NOTE — Assessment & Plan Note (Signed)
Patient currently asking for a refill of her xanax. Plan from my last appointment with her was to write for enough xanax for her to see psychiatry, at which point, further management would be at their discretion.  Patient has recently gone to see psychiatry for which we do not yet have records and would need her consent to obtain these records. Patient did state that psychiatry did not prescribe any medications.  She was prescribed xanax 0.5 mg QHS #14 with one refill one month ago and is now requesting another refill. Her next appointment with me is on 04/02/14.   - I will write for xanax 0.5 mg QHS #14 to get patient to next appointment. At that time, we will visit other options such as hydroxyzine. Further, we will hopefully have obtained her psychiatry records at that point. Long-term plans remain to wean and stop her from chronic use of benzodiazepines.

## 2014-03-14 NOTE — Telephone Encounter (Signed)
Patient currently asking for a refill of her xanax. Plan from my last appointment with her was to write for enough xanax for her to see psychiatry, at which point, further management would be at their discretion.  Patient has recently gone to see psychiatry for which we do not yet have records and would need her consent to obtain these records. Patient did state that psychiatry did not prescribe any medications.  She was prescribed xanax 0.5 mg QHS #14 with one refill one month ago and is now requesting another refill. Her next appointment with me is on 04/02/14.   - I will write for xanax 0.5 mg QHS #14 to get patient to next appointment. At that time, we will visit other options such as hydroxyzine. Further, we will hopefully have obtained her psychiatry records at that point. Long-term plans remain to wean and stop her from chronic use of benzodiazepines.   

## 2014-03-14 NOTE — Telephone Encounter (Signed)
Called xanax to [facility]

## 2014-03-17 ENCOUNTER — Telehealth (HOSPITAL_COMMUNITY): Payer: Self-pay | Admitting: Vascular Surgery

## 2014-03-17 ENCOUNTER — Encounter: Payer: Self-pay | Admitting: Internal Medicine

## 2014-03-17 DIAGNOSIS — I5043 Acute on chronic combined systolic (congestive) and diastolic (congestive) heart failure: Secondary | ICD-10-CM

## 2014-03-17 DIAGNOSIS — E119 Type 2 diabetes mellitus without complications: Secondary | ICD-10-CM

## 2014-03-17 DIAGNOSIS — N183 Chronic kidney disease, stage 3 (moderate): Secondary | ICD-10-CM

## 2014-03-17 DIAGNOSIS — I129 Hypertensive chronic kidney disease with stage 1 through stage 4 chronic kidney disease, or unspecified chronic kidney disease: Secondary | ICD-10-CM

## 2014-03-17 NOTE — Telephone Encounter (Signed)
Pt was given lasix on Friday loss 2 LBS.. Pt weight is back up to 373.Marland Kitchen

## 2014-03-17 NOTE — Telephone Encounter (Signed)
Spoke w/Beth she states pt is hiding drinks and snacks and calling different family members to bring her sodas, [name] states she has again reeducated pt and advised her about fluid restriction and importance of following, she will have pt take Metolazone today and tomorrow and she will see pt again on Thursday to f/u

## 2014-03-19 ENCOUNTER — Ambulatory Visit: Payer: Medicare Other | Admitting: Pulmonary Disease

## 2014-03-19 ENCOUNTER — Telehealth: Payer: Self-pay | Admitting: *Deleted

## 2014-03-19 NOTE — Telephone Encounter (Signed)
Talked to [facility] again and pt's social worker is trying to get pt into a better living situation.   Nurse was able to talk to patient again today and pt sounded better, calmer. Nurse asked patient again and she said she does not feel like she will harm self. I will call pt and see about a sooner appointment.

## 2014-03-19 NOTE — Telephone Encounter (Signed)
Call from Marcy with [facility] [phone]  Nurse calls to report that she saw pt today and reports pt is out of sorts, she is thinking about killing herself.  She had a disagreement with her daughter. She is depressed.  Nurse offered to call crisis line and pt denied.  Nurse states pt has seen psy but was told she could not receive any medications. Nurse feels this is affecting her physical status.  Pt has weight gain, poor diet etc.  Pt has scheduled appointment 11/18

## 2014-03-19 NOTE — Telephone Encounter (Signed)
Called pt's tele number and mailbox was full and I was unable to talk to pt or leave message.  If she is suicidal, [facility] needs to call EMS / 911. If not, then pls move up [facility] appt if possible- PCP or [facility] resident.

## 2014-03-20 ENCOUNTER — Other Ambulatory Visit: Payer: Self-pay | Admitting: *Deleted

## 2014-03-20 NOTE — Telephone Encounter (Signed)
Pt called and was out shopping.  She states she was angry yesterday when she stated she wanted to harm herself.  She is feeling better today.  She has several other doctors appointments ( Card and eye MD )  So will not be able to come in until 11/11.  I scheduled for that day and asked her to call sooner if she starts to feel that she will harm self.

## 2014-03-20 NOTE — Telephone Encounter (Signed)
Thanks

## 2014-03-20 NOTE — Telephone Encounter (Signed)
Insurance requires a ICD 10 code,   Please change and resend.  Pt is out of strips

## 2014-03-21 ENCOUNTER — Telehealth (HOSPITAL_COMMUNITY): Payer: Self-pay | Admitting: Vascular Surgery

## 2014-03-21 ENCOUNTER — Other Ambulatory Visit: Payer: Self-pay | Admitting: *Deleted

## 2014-03-21 DIAGNOSIS — I5022 Chronic systolic (congestive) heart failure: Secondary | ICD-10-CM

## 2014-03-21 MED ORDER — GLUCOSE BLOOD VI STRP: ORAL_STRIP | Status: DC

## 2014-03-21 MED ORDER — ISOSORBIDE MONONITRATE ER 60 MG PO TB24: 90.0000 mg | ORAL_TABLET | Freq: Every day | ORAL | Status: DC

## 2014-03-21 NOTE — Telephone Encounter (Signed)
Refill Isosorbide 

## 2014-03-21 NOTE — Telephone Encounter (Signed)
Scripts must have the new diag codes on them

## 2014-03-24 ENCOUNTER — Inpatient Hospital Stay (HOSPITAL_COMMUNITY)
Admission: AD | Admit: 2014-03-24 | Discharge: 2014-03-28 | DRG: 292 | Disposition: A | Discharge status: Home Health | Payer: Medicare Other | Source: Ambulatory Visit | Attending: Oncology | Admitting: Oncology

## 2014-03-24 ENCOUNTER — Encounter (HOSPITAL_COMMUNITY): Payer: Self-pay | Admitting: General Practice

## 2014-03-24 ENCOUNTER — Inpatient Hospital Stay (HOSPITAL_COMMUNITY): Payer: Medicare Other

## 2014-03-24 ENCOUNTER — Ambulatory Visit (HOSPITAL_COMMUNITY)
Admission: RE | Admit: 2014-03-24 | Discharge: 2014-03-24 | Disposition: A | Discharge status: Home / Self Care | Payer: Medicare Other | Source: Ambulatory Visit | Attending: Internal Medicine | Admitting: Internal Medicine

## 2014-03-24 VITALS — BP 96/78 | HR 96 | Wt 388.0 lb

## 2014-03-24 DIAGNOSIS — F209 Schizophrenia, unspecified: Secondary | ICD-10-CM | POA: Diagnosis not present

## 2014-03-24 DIAGNOSIS — F4321 Adjustment disorder with depressed mood: Secondary | ICD-10-CM | POA: Diagnosis not present

## 2014-03-24 DIAGNOSIS — G4733 Obstructive sleep apnea (adult) (pediatric): Secondary | ICD-10-CM | POA: Insufficient documentation

## 2014-03-24 DIAGNOSIS — R0602 Shortness of breath: Secondary | ICD-10-CM

## 2014-03-24 DIAGNOSIS — Z87891 Personal history of nicotine dependence: Secondary | ICD-10-CM

## 2014-03-24 DIAGNOSIS — M069 Rheumatoid arthritis, unspecified: Secondary | ICD-10-CM | POA: Diagnosis present

## 2014-03-24 DIAGNOSIS — Z95810 Presence of automatic (implantable) cardiac defibrillator: Secondary | ICD-10-CM | POA: Diagnosis not present

## 2014-03-24 DIAGNOSIS — I5043 Acute on chronic combined systolic (congestive) and diastolic (congestive) heart failure: Principal | ICD-10-CM | POA: Diagnosis present

## 2014-03-24 DIAGNOSIS — Z7901 Long term (current) use of anticoagulants: Secondary | ICD-10-CM

## 2014-03-24 DIAGNOSIS — Z6841 Body Mass Index (BMI) 40.0 and over, adult: Secondary | ICD-10-CM | POA: Diagnosis not present

## 2014-03-24 DIAGNOSIS — F32A Depression, unspecified: Secondary | ICD-10-CM | POA: Diagnosis present

## 2014-03-24 DIAGNOSIS — I429 Cardiomyopathy, unspecified: Secondary | ICD-10-CM | POA: Diagnosis present

## 2014-03-24 DIAGNOSIS — M1990 Unspecified osteoarthritis, unspecified site: Secondary | ICD-10-CM | POA: Diagnosis present

## 2014-03-24 DIAGNOSIS — I428 Other cardiomyopathies: Secondary | ICD-10-CM

## 2014-03-24 DIAGNOSIS — I5033 Acute on chronic diastolic (congestive) heart failure: Secondary | ICD-10-CM | POA: Insufficient documentation

## 2014-03-24 DIAGNOSIS — M109 Gout, unspecified: Secondary | ICD-10-CM | POA: Diagnosis present

## 2014-03-24 DIAGNOSIS — F259 Schizoaffective disorder, unspecified: Secondary | ICD-10-CM | POA: Diagnosis present

## 2014-03-24 DIAGNOSIS — Z9114 Patient's other noncompliance with medication regimen: Secondary | ICD-10-CM | POA: Diagnosis present

## 2014-03-24 DIAGNOSIS — Z79899 Other long term (current) drug therapy: Secondary | ICD-10-CM

## 2014-03-24 DIAGNOSIS — J449 Chronic obstructive pulmonary disease, unspecified: Secondary | ICD-10-CM | POA: Diagnosis present

## 2014-03-24 DIAGNOSIS — E119 Type 2 diabetes mellitus without complications: Secondary | ICD-10-CM | POA: Insufficient documentation

## 2014-03-24 DIAGNOSIS — R609 Edema, unspecified: Secondary | ICD-10-CM | POA: Insufficient documentation

## 2014-03-24 DIAGNOSIS — N183 Chronic kidney disease, stage 3 unspecified: Secondary | ICD-10-CM | POA: Diagnosis present

## 2014-03-24 DIAGNOSIS — Z794 Long term (current) use of insulin: Secondary | ICD-10-CM

## 2014-03-24 DIAGNOSIS — E1121 Type 2 diabetes mellitus with diabetic nephropathy: Secondary | ICD-10-CM | POA: Diagnosis present

## 2014-03-24 DIAGNOSIS — I129 Hypertensive chronic kidney disease with stage 1 through stage 4 chronic kidney disease, or unspecified chronic kidney disease: Secondary | ICD-10-CM | POA: Diagnosis not present

## 2014-03-24 DIAGNOSIS — E876 Hypokalemia: Secondary | ICD-10-CM | POA: Diagnosis not present

## 2014-03-24 DIAGNOSIS — F4310 Post-traumatic stress disorder, unspecified: Secondary | ICD-10-CM | POA: Diagnosis present

## 2014-03-24 DIAGNOSIS — Z86718 Personal history of other venous thrombosis and embolism: Secondary | ICD-10-CM | POA: Diagnosis not present

## 2014-03-24 DIAGNOSIS — Z952 Presence of prosthetic heart valve: Secondary | ICD-10-CM

## 2014-03-24 DIAGNOSIS — N189 Chronic kidney disease, unspecified: Secondary | ICD-10-CM | POA: Insufficient documentation

## 2014-03-24 DIAGNOSIS — I10 Essential (primary) hypertension: Secondary | ICD-10-CM | POA: Diagnosis present

## 2014-03-24 DIAGNOSIS — K219 Gastro-esophageal reflux disease without esophagitis: Secondary | ICD-10-CM | POA: Diagnosis present

## 2014-03-24 DIAGNOSIS — F418 Other specified anxiety disorders: Secondary | ICD-10-CM | POA: Diagnosis present

## 2014-03-24 DIAGNOSIS — F329 Major depressive disorder, single episode, unspecified: Secondary | ICD-10-CM | POA: Diagnosis present

## 2014-03-24 DIAGNOSIS — I509 Heart failure, unspecified: Secondary | ICD-10-CM

## 2014-03-24 HISTORY — DX: Obstructive sleep apnea (adult) (pediatric): G47.33

## 2014-03-24 HISTORY — DX: Low back pain, unspecified: M54.50

## 2014-03-24 HISTORY — DX: Long term (current) use of insulin: Z79.4

## 2014-03-24 HISTORY — DX: Dependence on other enabling machines and devices: Z99.89

## 2014-03-24 HISTORY — DX: Pneumonia, unspecified organism: J18.9

## 2014-03-24 HISTORY — DX: Type 2 diabetes mellitus without complications: E11.9

## 2014-03-24 HISTORY — DX: Unspecified chronic bronchitis: J42

## 2014-03-24 HISTORY — DX: Other chronic pain: G89.29

## 2014-03-24 HISTORY — DX: Reserved for inherently not codable concepts without codable children: IMO0001

## 2014-03-24 HISTORY — DX: Urinary tract infection, site not specified: N39.0

## 2014-03-24 HISTORY — DX: Low back pain: M54.5

## 2014-03-24 LAB — COMPREHENSIVE METABOLIC PANEL
ALBUMIN: 3.3 g/dL — AB (ref 3.5–5.2)
ALT: 35 U/L (ref 0–35)
AST: 31 U/L (ref 0–37)
Alkaline Phosphatase: 153 U/L — ABNORMAL HIGH (ref 39–117)
Anion gap: 14 (ref 5–15)
BUN: 20 mg/dL (ref 6–23)
CO2: 32 meq/L (ref 19–32)
CREATININE: 1.28 mg/dL — AB (ref 0.50–1.10)
Calcium: 9.5 mg/dL (ref 8.4–10.5)
Chloride: 95 mEq/L — ABNORMAL LOW (ref 96–112)
GFR calc Af Amer: 55 mL/min — ABNORMAL LOW (ref >90)
GFR, EST NON AFRICAN AMERICAN: 47 mL/min — AB (ref >90)
Glucose, Bld: 100 mg/dL — ABNORMAL HIGH (ref 70–99)
Potassium: 3.2 mEq/L — ABNORMAL LOW (ref 3.7–5.3)
SODIUM: 141 meq/L (ref 137–147)
TOTAL PROTEIN: 8 g/dL (ref 6.0–8.3)
Total Bilirubin: 0.4 mg/dL (ref 0.3–1.2)

## 2014-03-24 LAB — GLUCOSE, CAPILLARY: Glucose-Capillary: 184 mg/dL — ABNORMAL HIGH (ref 70–99)

## 2014-03-24 LAB — CBC
HEMATOCRIT: 36.1 % (ref 36.0–46.0)
Hemoglobin: 11.1 g/dL — ABNORMAL LOW (ref 12.0–15.0)
MCH: 26.1 pg (ref 26.0–34.0)
MCHC: 30.7 g/dL (ref 30.0–36.0)
MCV: 84.9 fL (ref 78.0–100.0)
Platelets: 249 10*3/uL (ref 150–400)
RBC: 4.25 MIL/uL (ref 3.87–5.11)
RDW: 17.8 % — ABNORMAL HIGH (ref 11.5–15.5)
WBC: 5.3 10*3/uL (ref 4.0–10.5)

## 2014-03-24 LAB — PRO B NATRIURETIC PEPTIDE: Pro B Natriuretic peptide (BNP): 228.5 pg/mL — ABNORMAL HIGH (ref 0–125)

## 2014-03-24 LAB — MAGNESIUM: MAGNESIUM: 1.7 mg/dL (ref 1.5–2.5)

## 2014-03-24 LAB — TROPONIN I: Troponin I: <0.3 ng/mL (ref <0.30)

## 2014-03-24 MED ORDER — MAGNESIUM OXIDE 400 (241.3 MG) MG PO TABS
400.0000 mg | ORAL_TABLET | Freq: Two times a day (BID) | ORAL | Status: DC
  Administered 2014-03-24 – 2014-03-28 (×8): 400 mg via ORAL
  Filled 2014-03-24 (×9): qty 1

## 2014-03-24 MED ORDER — AMITRIPTYLINE HCL 75 MG PO TABS
150.0000 mg | ORAL_TABLET | Freq: Every day | ORAL | Status: DC
  Administered 2014-03-24 – 2014-03-27 (×4): 150 mg via ORAL
  Filled 2014-03-24 (×5): qty 2

## 2014-03-24 MED ORDER — HYDROCODONE-ACETAMINOPHEN 10-325 MG PO TABS
1.0000 | ORAL_TABLET | Freq: Four times a day (QID) | ORAL | Status: DC | PRN
  Administered 2014-03-25 – 2014-03-28 (×8): 1 via ORAL
  Filled 2014-03-24 (×10): qty 1

## 2014-03-24 MED ORDER — METHOTREXATE 2.5 MG PO TABS
15.0000 mg | ORAL_TABLET | ORAL | Status: DC
Start: 2014-03-25 — End: 2014-03-28
  Administered 2014-03-25: 15 mg via ORAL
  Filled 2014-03-24: qty 6

## 2014-03-24 MED ORDER — FUROSEMIDE 10 MG/ML IJ SOLN
80.0000 mg | Freq: Three times a day (TID) | INTRAMUSCULAR | Status: DC
  Administered 2014-03-24 – 2014-03-27 (×10): 80 mg via INTRAVENOUS
  Filled 2014-03-24 (×15): qty 8

## 2014-03-24 MED ORDER — ALPRAZOLAM 0.25 MG PO TABS: 0.2500 mg | ORAL_TABLET | Freq: Every evening | ORAL | Status: DC | PRN

## 2014-03-24 MED ORDER — POTASSIUM CHLORIDE CRYS ER 20 MEQ PO TBCR
40.0000 meq | EXTENDED_RELEASE_TABLET | Freq: Two times a day (BID) | ORAL | Status: DC
Start: 2014-03-24 — End: 2014-03-25
  Administered 2014-03-24: 40 meq via ORAL
  Filled 2014-03-24 (×3): qty 2

## 2014-03-24 MED ORDER — METHOTREXATE (ANTI-RHEUMATIC) 2.5 MG PO TABS: 5.0000 mg | ORAL_TABLET | ORAL | Status: DC

## 2014-03-24 MED ORDER — PANTOPRAZOLE SODIUM 40 MG PO TBEC
80.0000 mg | DELAYED_RELEASE_TABLET | Freq: Every day | ORAL | Status: DC
  Administered 2014-03-24 – 2014-03-28 (×5): 80 mg via ORAL
  Filled 2014-03-24 (×3): qty 2

## 2014-03-24 MED ORDER — INSULIN ASPART 100 UNIT/ML ~~LOC~~ SOLN
0.0000 [IU] | Freq: Three times a day (TID) | SUBCUTANEOUS | Status: DC
  Administered 2014-03-25: 2 [IU] via SUBCUTANEOUS

## 2014-03-24 MED ORDER — RIVAROXABAN 20 MG PO TABS
20.0000 mg | ORAL_TABLET | Freq: Every day | ORAL | Status: DC
  Administered 2014-03-24 – 2014-03-28 (×5): 20 mg via ORAL
  Filled 2014-03-24 (×5): qty 1

## 2014-03-24 MED ORDER — INSULIN DETEMIR 100 UNIT/ML ~~LOC~~ SOLN
30.0000 [IU] | Freq: Every day | SUBCUTANEOUS | Status: DC
  Administered 2014-03-24: 30 [IU] via SUBCUTANEOUS
  Filled 2014-03-24 (×2): qty 0.3

## 2014-03-24 MED ORDER — SODIUM CHLORIDE 0.9 % IJ SOLN
3.0000 mL | Freq: Two times a day (BID) | INTRAMUSCULAR | Status: DC
  Administered 2014-03-24 – 2014-03-28 (×8): 3 mL via INTRAVENOUS

## 2014-03-24 MED ORDER — ALBUTEROL SULFATE (2.5 MG/3ML) 0.083% IN NEBU: 3.0000 mL | INHALATION_SOLUTION | Freq: Four times a day (QID) | RESPIRATORY_TRACT | Status: DC | PRN

## 2014-03-24 MED ORDER — GABAPENTIN 100 MG PO CAPS
100.0000 mg | ORAL_CAPSULE | Freq: Three times a day (TID) | ORAL | Status: DC
  Administered 2014-03-24 – 2014-03-28 (×12): 100 mg via ORAL
  Filled 2014-03-24 (×13): qty 1

## 2014-03-24 MED ORDER — ATORVASTATIN CALCIUM 40 MG PO TABS
40.0000 mg | ORAL_TABLET | Freq: Every day | ORAL | Status: DC
  Administered 2014-03-24 – 2014-03-28 (×5): 40 mg via ORAL
  Filled 2014-03-24 (×5): qty 1

## 2014-03-24 MED ORDER — MAGNESIUM OXIDE 400 MG PO CAPS: 400.0000 mg | ORAL_CAPSULE | Freq: Two times a day (BID) | ORAL | Status: DC

## 2014-03-24 MED ORDER — ALPRAZOLAM 0.5 MG PO TABS
0.5000 mg | ORAL_TABLET | Freq: Every day | ORAL | Status: DC
  Administered 2014-03-24 – 2014-03-27 (×4): 0.5 mg via ORAL
  Filled 2014-03-24 (×4): qty 1

## 2014-03-24 NOTE — Progress Notes (Signed)
1730 admitted as direct admit from family service clinic. Pt is fully awake alert and oriented .kept comfortable . Oriented to room and equipments

## 2014-03-24 NOTE — Progress Notes (Signed)
Date: 03/24/2014               Patient Name:  [name] MRN: [id number]  DOB: 1961-02-21 Age / Sex: 53 y.o., female   PCP: Harold Barban, MD              Medical Service: Internal Medicine Teaching Service              Attending Physician: Dr. Levert Feinstein, MD    First Contact: Fulton Reek, MS 4 Pager: [phone]  Second Contact: Dr. Mariea Clonts Pager: [phone]       After Hours (After 5p/  First Contact Pager: [phone]  weekends / holidays): Second Contact Pager: [phone]   Chief Complaint: "Shortness of Breath"  History of Present Illness:  [name] is a 54 year old woman with a past medical history of congestive non-ischemic systolic heart failure, mitral valve disease with repair, HTN, GERD, gout, DVT of right leg, anxiety, RA, and other problems who presents for admission to the inpatient medicine teaching service from the heart failure clinic. She was seen in clinic today by Dr. Gala Romney who was concerned about her being volume overloaded, short of breath, and poorly compliant with her medications over the last few days.  According to the patient she developed shortness of breath with activity around 3 days ago, having to stop moving and take a few deep breaths for relief, but otherwise has not been feeling short of breath at rest. Unfortunately, she did not take any of her medications on Friday or Saturday, as she ran out of depends undergarments and did not want to soil the bed. She then resumed her medications yesterday, however she admitted that she has been "eating whatever she wants" over the last few weeks, and has been eating many pickles. She has also felt increased "stomach tightness" and swelling in her legs and feet over the past week, with a few ulcers having developed over her legs which she keeps wrapped via home health.  She has also experienced a dry cough for the past week.  Also, she experienced an episode of dizziness on Friday after standing up too  quickly, she has had episodes like this in the past with 2 falls in the last few months.  She sleeps propped up on 3 pillows each night as she has profound orthopnea, although she denies paroxysmal nocturnal dyspnea. She uses a CPAP machine at night, however says she has been partially compliant with use, often waking up at night and noticing the machine is removed or she removes it due to discomfort. She denies any chest pain, however experienced an isolated episode of chest tightness last Thursday which has since resolved. She is unsure if that episode was simply a reflux episode as she had increased reflux symptoms the next two days.  She does not know her usual dry weight however states that she weighed 341 lbs around 2 months ago and that in clinic today she was measured at 388 lbs.  She complains that she has been stressed, in depression, and experiencing a large amount of joint pain over the last few weeks as she is in a difficult home living situation. She suffers from chronic constipation and usually takes miralax powder to have bowel movements. Her last bowel movement was on Saturday which required much straining. She usually has bowel movements every few days and has had blood in her stools, however this is due to her history of hemmorhoids. She has been  urinating regularly without any pain, burning, or hematuria. She denies any other new onset symptoms of confusion, headaches, or changes in vision however attests that she is in need of new glasses. She denies any fevers, chills, or rigors.   Meds: No current facility-administered medications for this encounter.    Allergies: Allergies as of 03/24/2014 - Review Complete 03/14/2014  Allergen Reaction Noted  . Risperidone Other (See Comments) 03/21/2011  . Colchicine Hives and Itching 11/13/2006  . Morphine Itching 05/21/2010  . Shrimp [shellfish allergy] Hives and Itching 01/25/2012   Past Medical History  Diagnosis Date  . Severe  mitral regurgitation     a. Minimally invasive MV repair on 12/18/08. Additionally TV repair  and PFO closure.  . Chronic systolic CHF (congestive heart failure)     a. 12/2011 Echo: EF 30%, diff HK, mild LVH, Gr 1 DD, mildly dil LA. b. 2/2 NICM.  Marland Kitchen Microcytic anemia     a. Small capsule endoscopy 2012 with gastric erosion and small colonic AVMs. b. EGD and colonoscopy before that in 12/11 did not reveal source of bleeding)  . Gastric ulcer   . NICM (nonischemic cardiomyopathy)     a.12/2011: s/p SJM [id number] single lead ICD, Ser #: [id number]  . Obstructive sleep apnea     on CPAP  . GERD (gastroesophageal reflux disease)   . HTN (hypertension)   . Gout   . Depression   . Colitis 2/11  . Chronic back pain   . Chronic knee pain   . DVT (deep venous thrombosis)     right leg  . Anxiety   . Rheumatoid arthritis(714.0)     "shoulders & knees"  . Sleep disturbance     07/14/11 "haven't slept in 10 months; since going to [facility]"  . Hx: UTI (urinary tract infection)   . Nocturia   . COPD (chronic obstructive pulmonary disease)   . Bronchitis   . PTSD (post-traumatic stress disorder)   . Schizophrenia   . Dysmenorrhea   . Morbid obesity   . Acute renal insufficiency     a. ?Cardiorenal 09/2012.  Marland Kitchen Hyperglycemia   . Automatic implantable cardioverter-defibrillator in situ   . Chronic diastolic HF (heart failure)     a. ECHO (05/2013) EF 50-55%, RV dilated and [facility]   Past Surgical History  Procedure Laterality Date  . Cardiac valve replacement  12/2008    cardiac  . Cardiac catheterization  04/20/2009, 11/04/2008  . Right knee arthroscopy  06/11/2004  . Extraction of teeth  12/04/2008  . Right mini thoracotomy for mitral valve repair and closure of [place]  12/18/2008  . Cardiac defibrillator placement     Family History  Problem Relation Age of Onset  . Hypertension Sister   . Alcoholism Father     died in his 57's or 66's  . Other Mother     alive & well @ 58.  . Colon cancer Neg Hx     History   Social History  . Marital Status: Single    Spouse Name: N/A    Number of Children: 5  . Years of Education: N/A   Occupational History  . disability    Social History Main Topics  . Smoking status: Former Smoker -- 0.50 packs/day for 27 years    Types: Cigarettes    Quit date: 12/18/2008  . Smokeless tobacco: Never Used  . Alcohol Use: No     Comment: "stopped drinking alcohol 12/2003; did drink ~ 6 shots/wk"  .  Drug Use: No     Comment: "last cocaine use 2009"  . Sexual Activity: No   Other Topics Concern  . Not on file   Social History Narrative   Pt lives in [place] with her dtr.  On disability.    Review of Systems: Constitutional: negative Eyes: negative Ears, nose, mouth, throat, and face: negative Respiratory: positive for cough and dyspnea on exertion, negative for hemoptysis and pleurisy/chest pain Cardiovascular: positive for orthopnea, negative for chest pain, chest pressure/discomfort and syncope Gastrointestinal: positive for abdominal pain and constipation, negative for change in bowel habits, dyspepsia, dysphagia, nausea and vomiting Genitourinary:negative Integument/breast: negative Hematologic/lymphatic: negative Musculoskeletal:positive for arthralgias, bone pain, myalgias and stiff joints, negative for muscle weakness Neurological: negative Behavioral/Psych: positive for depression Endocrine: negative Allergic/Immunologic: negative  Physical Exam: Last menstrual period 05/05/2012. LMP 05/05/2012  General Appearance:    Alert, cooperative, no distress, appears stated age  Head:    Normocephalic, without obvious abnormality, atraumatic  Eyes:    PERRL, conjunctiva/corneas clear, EOM's intact, fundi    benign, both eyes  Nose:   Nares normal, septum midline, mucosa normal, no drainage    or sinus tenderness  Throat:   Lips, mucosa, and tongue normal; has dentures  Neck:   Unable to assess JVP due to upright position and habitus,  midline trachea  Back:     Symmetric, no curvature, ROM normal, no CVA tenderness  Lungs:     Clear to auscultation bilaterally but distant  Chest Wall:    No tenderness or deformity   Heart:    Regular rate and rhythm, S1 and S2 normal, no murmur, rub   or gallop  Abdomen:    Obese, but soft and non tender with normal bowel sounds  Extremities:   Lower extremities wrapped in multiple layers of bandages, however her toes were exposed: left foot was warm and well perfused to palpation, right foot was slightly cooler to palpation  Pulses:   2+ and symmetric all extremities  Skin:   Skin color, texture, turgor normal, no rashes or lesions  Neurologic:   CNII-XII intact, normal strength, sensation and reflexes    throughout    Lab results: No results found for this or any previous visit (from the past 24 hour(s)).  Imaging results:  No results found.  Other results: EKG: Pending.  Assessment & Plan by Problem: Active Problems:   * No active hospital problems. *  Ms. Lydiana GAYLA BENN is a 53 year old woman with a past medical history of congestive non-ischemic systolic heart failure, mitral valve disease with repair, HTN, GERD, gout, DVT of right leg, anxiety, RA, and other problems who presents in volume overload with a CHF exacerbation likely due to noncompliance and dietary indiscretion.  **CHF exacerbation The patient is clinically volume up given her symptoms of shortness of breath, stomach tightness, swelling, etc. She also missed two full days of medications and has been eating a high salt diet, and remains relatively inactive. This combination has likely put her into a heart failure exacerbation and she will benefit from IV diuresis with inpatient admission. Per her outpatient note, she is believed to have diastolic dysfunction and non-ischemic cardiomyopathy secondary to mitral valve disease which has since been repaired and is stable per echo. Her last EF was 50-55% in January of  this year. Will admit to the medicine teaching service and begin diuresis, and workup for extent of heart failure. - Admit to tele - IV lasix 80mg  IV q8 -  Hold home imdur, coreg in light of soft BP - Continue home atorvastatin - EKG - Trend Troponins - TTE - Pro-BNP - CBC, BMP - CXR   **DM The patient has a history of Type 2 DM, with a hemoglobin A1c of 13.0 in April of this year and concomitant kidney disease. Fortunately her A1c was much improved in July and down to 7.6 with 50 units of levemir daily.  - Continue home levemir, titrate dose - Carb controlled diet  **CKD Patient has a history of chronic kidney disease, with creatinines increased up to 2.34 in July, however have since decreased to 1.2 as of October. This is believed to be a consequence of her poorly controlled DM in the past, and has been improving with better control of her diabetes. -Monitor creatinine per above  **HTN Has been well controlled per last clinic note, her BPs have been somewhat soft today and as we will be diuresing we will hold her BP meds for now. - Hold spironolactone and hydralazine  **History of DVT Patient has a history of a DVT of the right leg, she is chronically inactive. - Continue home xarelto  **Depression/Anxiety Patient has a tough situation at home with daughter, may benefit from psychiatry follow up and better treatment of her depressive symptoms. - Continue home Alprazolam - Consider Psych consult  **Joint Pain - Norco 10-325 TID  **GERD - Continue PPI  **FEN/GI - Miralax  **DVT PPx - Per above  This is a Psychologist, occupational Note.  The care of the patient was discussed with Dr. Mariea Clonts and the assessment and plan was formulated with their assistance.  Please see their note for official documentation of the patient encounter.   Signed: Tamera Punt, Med Student 03/24/2014, 5:41 PM

## 2014-03-24 NOTE — Progress Notes (Signed)
Patient ID: [name], female   DOB: Apr 19, 1961, 53 y.o.   MRN: [id number] PCP: Dr. Loma Newton (Internal Medicine)  53 yo with CHF probably secondary to severe MR now s/p MV repair, rheumatoid arthritis, schizophrenia, overdose of seroquel 2013, S/P St Jude ICD 2013, and PUD.  EF initially low, but most recent echo in 1/15 showed EF 50-55%, stable MV repair, RV dilated and hypokinetic but difficult images. Newly diagnosed DM2, Hgb A1C 13% in April 2015. CKD (Cr 1.4-1.7)  Follow for Heart Failure: Continues to follow with [facility]. Paramedicine also following at times. Getting iV lasix at home.  Very stressful home situation. Moved in with one of her daughters who has OCD and is unable to tolerate living with her. Looking into new housing situation.  Weight is up about 10-15 lbs. She has been skipping medicines. Also + dietary indiscretion. Eating pickles. Denies dyspnea. + severe edema. Says her whole body hurts.   Labs (11/14): K 4.1 Creatinine 1.96  Labs (1/15): K 3.2, creatinine 2.28 Labs (2/15): K 3.6, creatinine 2.05 Labs (08/26/13): K 3.0 Creatinine 1.6 Labs (11/22/13): K 3.6, creatinine 1.0 Labs (12/04/13): K 3.4, creatinine 2.34 Labs 12/09/13): K 3.8 Cr 1.64 Labs (9/15): K 3.9, creatinine 1.24 Labs (02/19/14) K 3.9 creatinine 1.2  Allergies:  1) ! * Colchicine  2) ! Morphine   Past History:  1. Congestive heart failure, most likely secondary to severe mitral regurgitation. TEE (6/10): EF 40%, diffuse hypokinesis, mild to moderately dilated LV, severe eccentric MR, small PFO. Cardiac MRI (6/10): EF 37% with global hypokinesis, moderate to severe MR, no delayed enhancement (no evidence for sarcoidosis or other infiltrative disease). TTE (10/10) after MV repair: EF 25-30%, moderately dilated LV, moderate diastolic dysfunction, mildly depressed RV function, trivial MR, MV mean gradient 5 mmHg, PASP 30 mmHg. RHC (12/10): mean RA 19, PA 46/26, mean PCWP 28, CI 2.5, SVO2 63%. TTE (2/11): EF 35-40% with  diffuse hypokinesis, no significant MR or MS. TTE (7/11): EF 45%, mild global hypokinesis, s/p MV repair, no mitral regurgitation, minimal mitral stenosis, PA systolic pressure 40 mmHg, mild LV dilation.  TTE (11/12): EF 30-35%, mild LV dilation, mild LVH, s/p MV repair with no regurgitation and minimal stenosis.  TTE (3/13): EF 25-30%, diffuse hypokinesis, no significant mitral stenosis by pressure halftime with mean gradient 7 mmHg across valve, no MR.  Echo (6/13): EF 30-35%, mildly dilated LV, mild mitral stenosis but no MR.  St Jude ICD placed in 8/13. RHC (5/14): mean RA 10, PA 23/10, mean PCWP 17, CI 2.17.  Echo (11/14) with EF 40%, stable repaired MV, mild to moderately dilated RV with mildly decreased systolic function.  Echo (1/15) with EF 50-55%, s/p MV repair with no MS or significant MR, RV poorly visualized but appears hypokinetic and dilated.  2. Severe mitral regurgitation (see above). Minimally invasive mitral valve repair on 12/18/08. She additionally had TV repair and PFO closure.  3. Microcytic anemia: EGD and colonoscopy in 12/11 did not reveal source of bleeding.  4. H/o gastric ulcers.  5. Morbid obesity.  6. Obstructive sleep apnea, on CPAP.  7. GERD.  8. Hypertension, controlled.  9. Rheumatoid arthritis  10. Gout.  11. Depression.  12. LHC (6/10): No angiographic CAD. Mildly dilated LV. 3+ MR. EF 40%.  13. Prior smoking, quit  14. Colitis (2/11)  15. Schizophrenia versus schizoaffective disorder 16. H/o DVT: On Xarelto 17. Syncope while coughing.  18. DM 19. Hemorrhoidal bleeding  Family History:  Negative for cardiac or  renal disease.  No FH of Colon Cancer  Social History:  Single, 5 children. Unemployed.  Tobacco Use - Yes. Smoked < 1 ppd, quit 6/10.  Alcohol Use - no  Regular Exercise - no  Drug Use - no, hx crack-cocaine use  Daily Caffeine Use: 2 daily   Review of Systems  All systems reviewed and negative except as per HPI.  Current Outpatient  Prescriptions  Medication Sig Dispense Refill  . ALPRAZolam (XANAX) 0.5 MG tablet Take 1 tablet (0.5 mg total) by mouth at bedtime. 14 tablet 0  . amitriptyline (ELAVIL) 150 MG tablet Take 1 tablet (150 mg total) by mouth at bedtime. 30 tablet 5  . atorvastatin (LIPITOR) 40 MG tablet Take 1 tablet (40 mg total) by mouth daily. 30 tablet 11  . BD PEN NEEDLE NANO U/F 32G X 4 MM MISC     . carvedilol (COREG) 12.5 MG tablet TAKE ONE & ONE-HALF TABLETS BY MOUTH TWICE DAILY 90 tablet 3  . diclofenac sodium (VOLTAREN) 1 % GEL Apply 2 g topically 4 (four) times daily. 100 g 2  . febuxostat (ULORIC) 40 MG tablet Take 80 mg by mouth daily.    . ferrous sulfate 325 (65 FE) MG tablet Take 1 tablet (325 mg total) by mouth 3 (three) times daily with meals. 90 tablet 3  . gabapentin (NEURONTIN) 100 MG capsule Take 1 capsule (100 mg total) by mouth 3 (three) times daily. 90 capsule 0  . glucose blood (FREESTYLE LITE) test strip Use to check blood sugar 4 times daily. diag code E11.21. Pt is insulin dependent 120 each 12  . hydrALAZINE (APRESOLINE) 50 MG tablet TAKE ONE TABLET BY MOUTH THREE TIMES DAILY 90 tablet 3  . HYDROcodone-acetaminophen (NORCO) 10-325 MG per tablet Take 1 tablet by mouth every 6 (six) hours as needed for severe pain. 120 tablet 0  . Insulin Detemir (LEVEMIR) 100 UNIT/ML Pen Inject 50 Units into the skin daily at 10 pm. 45 mL 3  . isosorbide mononitrate (IMDUR) 60 MG 24 hr tablet Take 1.5 tablets (90 mg total) by mouth daily. 135 tablet 1  . Magnesium Oxide 400 MG CAPS Take 1 capsule (400 mg total) by mouth 2 (two) times daily. 4 capsule 0  . methotrexate (RHEUMATREX) 2.5 MG tablet Take 5 mg by mouth 3 (three) times a week.     . metolazone (ZAROXOLYN) 2.5 MG tablet Take 5 mg by mouth once a week. Every Tuesday    . nystatin (MYCOSTATIN/NYSTOP) 100000 UNIT/GM POWD Apply 1 Bottle topically 2 (two) times daily. 30 g 0  . omeprazole (PRILOSEC) 40 MG capsule Take 1 capsule (40 mg total) by  mouth daily. 30 capsule 3  . polyethylene glycol (MIRALAX / GLYCOLAX) packet Take 17 g by mouth daily as needed for moderate constipation. 14 each 1  . potassium chloride SA (K-DUR,KLOR-CON) 20 MEQ tablet Take 2 tablets (40 mEq total) by mouth 2 (two) times daily. Take on extra 20 meq on Tuesday with Metolazone    . PROAIR HFA 108 (90 BASE) MCG/ACT inhaler INHALE TWO PUFFS BY MOUTH EVERY 6 HOURS AS NEEDED FOR  WHEEZING  OR  SHORTNESS  OF  BREATH 9 each 0  . rivaroxaban (XARELTO) 20 MG TABS tablet Take 1 tablet (20 mg total) by mouth daily with supper. 30 tablet 3  . spironolactone (ALDACTONE) 25 MG tablet TAKE ONE TABLET BY MOUTH ONCE DAILY 30 tablet 3  . torsemide (DEMADEX) 100 MG tablet Take 1 tablet (100 mg  total) by mouth 2 (two) times daily. 180 tablet 3  . [DISCONTINUED] QUEtiapine (SEROQUEL) 100 MG tablet Take 400 mg by mouth at bedtime.      No current facility-administered medications for this encounter.   Filed Vitals:   03/24/14 1548  BP: 96/78  Pulse: 96  Weight: 388 lb (175.996 kg)  SpO2: 99%   General: NAD, obese. Sitting in WC Neck: Thick neck, JVP hard to see but appears elevated, no thyromegaly or thyroid nodule.  Lungs:Clear  CV: Nondisplaced PMI.  Heart regular S1/S2, no S3/S4, no murmur.   No carotid bruit.  Normal pedal pulses.  Abdomen: Soft, markedly obese. Nontender, mild distention.  Neurologic: Alert and oriented x 3. Moves all 4 without difficulty Psych: Tearful at times.  Extremities: No clubbing or cyanosis. Wrapped. Probably 3+ edema underneath  Assessment/Plan  1. Acute on chronic diastolic CHF:  Nonischemic cardiomyopathy, EF recovered now 50-55% but RV dilated with hypokinesis (echo 1/15).  St Jude ICD.  NYHA class III symptoms (chronic, stable).  She is a markedly volume overloaded today in setting of medication and dietary non-complaince - Will ask IM to admit for IV diuresis and we will follow. Start Lasix 80 iv q8.  - Continue current Coreg, imdur,  hydralazine and spironolactone - She is not on Ace or Arb due to elevated creatinine in past 2. CKD:  3. Morbid obesity: Evaluation ongoing in bariatric program, hope for gastric sleeve.    4. DVT: Suspected DVT 11/14 admission.  She is on Xarelto, would probably continue long-term given her inactivity.  5. Mitral valve repair: Stable on 1/15 echo.  6. DM:  Per IM 7. Depression: situational. Per IM.   Arvilla Meres MD  3:43 PM 03/24/2014

## 2014-03-24 NOTE — H&P (Signed)
Date: 03/24/2014               Patient Name:  [name] MRN: [id number]  DOB: 1960/08/31 Age / Sex: 53 y.o., female   PCP: Harold Barban, MD         Medical Service: Internal Medicine Teaching Service         Attending Physician: Dr. Levert Feinstein, MD    First Contact: Fulton Reek, MS4 Pager: [phone]  Second Contact: Dr. Mariea Clonts Pager: [phone]       After Hours (After 5p/  First Contact Pager: [phone]  weekends / holidays): Second Contact Pager: [phone]   Chief Complaint: Shortness of breath and abdominal tightness.  History of Present Illness: 80 Y O F with PMH of CHF- Last EF- 05/21/2013- 50-55%. DM 2, HTN, RA, NICM, OSA, CKD 3, MR status post repair 2010 with PFO closure, with AICD, and anxiety. Pt presented with SOB present for the past- 3 days duration present with mild exertion, she sleeps propped on 3 pillows, but denies PND. Pt says she has also been having chest tightness, abdominal distention and tightness. She also has had a dry Cough over the past week, but denies fevers, chills, chest pain. Pt endorses been mostly complaint with her meds, except for 2 days - Friday and Saturday. She has been eating basically any meal without consideration for salt content, including pickles. Pt says though that she sometimes does not the Fluid pills because of increased frequency of urination and sometimes incontinence, but denies dysuria. Pt says her weight about 2 months ago- 240s, she has not been checking her weight consistently, but says it has been 380s.  She presented  For follow up at the heart failure clinic today, and was seen by Dr. Gala Romney who was concerned about her her significant weight gain, she was therefore admitted for IV diuresis.    Meds: Current Facility-Administered Medications  Medication Dose Route Frequency Provider Last Rate Last Dose  . albuterol (PROVENTIL) (2.5 MG/3ML) 0.083% nebulizer solution 3 mL  3 mL Inhalation Q6H PRN Ejiroghene Wendall Stade, MD       . ALPRAZolam Prudy Feeler) tablet 0.5 mg  0.5 mg Oral QHS Ejiroghene E Emokpae, MD      . amitriptyline (ELAVIL) tablet 150 mg  150 mg Oral QHS Ejiroghene E Emokpae, MD      . atorvastatin (LIPITOR) tablet 40 mg  40 mg Oral Daily Ejiroghene E Emokpae, MD      . furosemide (LASIX) injection 80 mg  80 mg Intravenous 3 times per day Ejiroghene E Mariea Clonts, MD      . gabapentin (NEURONTIN) capsule 100 mg  100 mg Oral TID Ejiroghene Wendall Stade, MD      . HYDROcodone-acetaminophen (NORCO) 10-325 MG per tablet 1 tablet  1 tablet Oral Q6H PRN Griffin Basil, MD      . insulin detemir (LEVEMIR) injection 30 Units  30 Units Subcutaneous Q2200 Ejiroghene E Emokpae, MD      . magnesium oxide (MAG-OX) tablet 400 mg  400 mg Oral BID Ejiroghene E Mariea Clonts, MD      . Melene Muller ON 03/25/2014] methotrexate (RHEUMATREX) tablet 15 mg  15 mg Oral Weekly Levert Feinstein, MD      . pantoprazole (PROTONIX) EC tablet 80 mg  80 mg Oral Daily Ejiroghene Wendall Stade, MD   80 mg at 03/24/14 1902  . potassium chloride SA (K-DUR,KLOR-CON) CR tablet 40 mEq  40 mEq Oral BID Ejiroghene Wendall Stade, MD      .  rivaroxaban (XARELTO) tablet 20 mg  20 mg Oral Q supper Ejiroghene Wendall Stade, MD   20 mg at 03/24/14 1902  . sodium chloride 0.9 % injection 3 mL  3 mL Intravenous Q12H Ejiroghene E Emokpae, MD        Prescriptions prior to admission  Medication Sig Dispense Refill Last Dose  . ALPRAZolam (XANAX) 0.5 MG tablet Take 1 tablet (0.5 mg total) by mouth at bedtime. 14 tablet 0 03/23/2014 at Unknown time  . amitriptyline (ELAVIL) 150 MG tablet Take 1 tablet (150 mg total) by mouth at bedtime. 30 tablet 5 03/23/2014 at Unknown time  . atorvastatin (LIPITOR) 40 MG tablet Take 1 tablet (40 mg total) by mouth daily. 30 tablet 11 03/23/2014 at Unknown time  . carvedilol (COREG) 12.5 MG tablet TAKE ONE & ONE-HALF TABLETS BY MOUTH TWICE DAILY 90 tablet 3 03/23/2014 at 1800  . diclofenac sodium (VOLTAREN) 1 % GEL Apply 2 g topically 4 (four) times  daily. 100 g 2 03/23/2014 at Unknown time  . febuxostat (ULORIC) 40 MG tablet Take 40 mg by mouth daily.    03/23/2014 at Unknown time  . ferrous sulfate 325 (65 FE) MG tablet Take 1 tablet (325 mg total) by mouth 3 (three) times daily with meals. 90 tablet 3 03/23/2014 at Unknown time  . hydrALAZINE (APRESOLINE) 50 MG tablet TAKE ONE TABLET BY MOUTH THREE TIMES DAILY 90 tablet 3 03/23/2014 at Unknown time  . HYDROcodone-acetaminophen (NORCO) 10-325 MG per tablet Take 1 tablet by mouth every 6 (six) hours as needed for severe pain. 120 tablet 0 03/23/2014 at Unknown time  . Insulin Detemir (LEVEMIR) 100 UNIT/ML Pen Inject 50 Units into the skin daily at 10 pm. 45 mL 3 03/23/2014 at Unknown time  . isosorbide mononitrate (IMDUR) 60 MG 24 hr tablet Take 1.5 tablets (90 mg total) by mouth daily. 135 tablet 1 03/23/2014 at Unknown time  . Magnesium Oxide 400 MG CAPS Take 1 capsule (400 mg total) by mouth 2 (two) times daily. 4 capsule 0 03/23/2014 at Unknown time  . methotrexate (RHEUMATREX) 2.5 MG tablet Take 15 mg by mouth once a week. Takes weekly on Monday   03/17/2014 at Unknown time  . metolazone (ZAROXOLYN) 2.5 MG tablet Take 5 mg by mouth once a week. Every Tuesday   03/23/2014 at Unknown time  . nystatin (MYCOSTATIN/NYSTOP) 100000 UNIT/GM POWD Apply 1 Bottle topically 2 (two) times daily. 30 g 0 03/23/2014 at Unknown time  . omeprazole (PRILOSEC) 40 MG capsule Take 1 capsule (40 mg total) by mouth daily. 30 capsule 3 03/23/2014 at Unknown time  . polyethylene glycol (MIRALAX / GLYCOLAX) packet Take 17 g by mouth daily as needed for moderate constipation. 14 each 1 Past Week at Unknown time  . potassium chloride SA (K-DUR,KLOR-CON) 20 MEQ tablet Take 2 tablets (40 mEq total) by mouth 2 (two) times daily. Take on extra 20 meq on Tuesday with Metolazone   03/23/2014 at Unknown time  . PROAIR HFA 108 (90 BASE) MCG/ACT inhaler INHALE TWO PUFFS BY MOUTH EVERY 6 HOURS AS NEEDED FOR  WHEEZING  OR  SHORTNESS  OF   BREATH 9 each 0 Past Week at Unknown time  . rivaroxaban (XARELTO) 20 MG TABS tablet Take 1 tablet (20 mg total) by mouth daily with supper. 30 tablet 3 03/23/2014 at Unknown time  . spironolactone (ALDACTONE) 25 MG tablet TAKE ONE TABLET BY MOUTH ONCE DAILY 30 tablet 3 03/23/2014 at Unknown time  . torsemide (DEMADEX) 100  MG tablet Take 1 tablet (100 mg total) by mouth 2 (two) times daily. 180 tablet 3 03/23/2014 at Unknown time  . BD PEN NEEDLE NANO U/F 32G X 4 MM MISC    unk  . gabapentin (NEURONTIN) 100 MG capsule Take 1 capsule (100 mg total) by mouth 3 (three) times daily. 90 capsule 0 no  . glucose blood (FREESTYLE LITE) test strip Use to check blood sugar 4 times daily. diag code E11.21. Pt is insulin dependent 120 each 12     Allergies: Allergies as of 03/24/2014 - Review Complete 03/24/2014  Allergen Reaction Noted  . Risperidone Other (See Comments) 03/21/2011  . Colchicine Hives and Itching 11/13/2006  . Morphine Itching 05/21/2010  . Shrimp [shellfish allergy] Hives and Itching 01/25/2012   Past Medical History  Diagnosis Date  . Severe mitral regurgitation     a. Minimally invasive MV repair on 12/18/08. Additionally TV repair  and PFO closure.  . Chronic systolic CHF (congestive heart failure)     a. 12/2011 Echo: EF 30%, diff HK, mild LVH, Gr 1 DD, mildly dil LA. b. 2/2 NICM.  Marland Kitchen Microcytic anemia     a. Small capsule endoscopy 2012 with gastric erosion and small colonic AVMs. b. EGD and colonoscopy before that in 12/11 did not reveal source of bleeding)  . NICM (nonischemic cardiomyopathy)     a.12/2011: s/p SJM 1311-36Q single lead ICD, Ser #: [id number]  . GERD (gastroesophageal reflux disease)   . HTN (hypertension)   . Gout   . Depression   . Colitis 2/11  . Chronic back pain   . Chronic knee pain   . DVT (deep venous thrombosis)     "BLE"  . Anxiety   . Sleep disturbance     07/14/11 "haven't slept in 10 months; since going to [facility]"  . Nocturia   . COPD (chronic  obstructive pulmonary disease)   . PTSD (post-traumatic stress disorder)   . Dysmenorrhea   . Morbid obesity   . Acute renal insufficiency     a. ?Cardiorenal 09/2012.  Marland Kitchen Hyperglycemia   . Automatic implantable cardioverter-defibrillator in situ   . Chronic diastolic HF (heart failure)     a. ECHO (05/2013) EF 50-55%, RV dilated and KH  . Chronic bronchitis     "get it q yr"  . Pneumonia X 1  . OSA on CPAP   . IDDM (insulin dependent diabetes mellitus)   . Rheumatoid arthritis(714.0)     "shoulders & knees"  . Chronic lower back pain   . Frequent UTI    Past Surgical History  Procedure Laterality Date  . Mitral valve replacement  12/2008  . Cardiac catheterization  04/20/2009, 11/04/2008  . Knee arthroscopy Right 06/11/2004  . Multiple tooth extractions  12/04/2008    "took them all out"  . Right mini thoracotomy for mitral valve repair and closure of foreman ovale Right 12/18/2008  . Cardiac defibrillator placement    . Tubal ligation  1980   Family History  Problem Relation Age of Onset  . Hypertension Sister   . Alcoholism Father     died in his 32's or 81's  . Other Mother     alive & well @ 26.  . Colon cancer Neg Hx    History   Social History  . Marital Status: Single    Spouse Name: N/A    Number of Children: 5  . Years of Education: N/A   Occupational History  . disability  Social History Main Topics  . Smoking status: Former Smoker -- 0.50 packs/day for 27 years    Types: Cigarettes    Quit date: 12/18/2008  . Smokeless tobacco: Never Used  . Alcohol Use: Yes     Comment: 03/24/2014 "drink some on holidays or birthdays"  . Drug Use: Yes    Special: "Crack" cocaine     Comment: "last cocaine use 2008"  . Sexual Activity: No   Other Topics Concern  . Not on file   Social History Narrative   Pt lives in [place] with her dtr.  On disability.    Review of Systems: CONSTITUTIONAL- No Fever, night sweat or change in appetite. SKIN- No Rash, colour  changes or itching. HEAD- No Headache or dizziness. EYES- No Vision changes- acutely. Mouth/throat- Wears dentures. GI- No nausea, vomiting, diarrhoea, constipation, abd pain. URINARY- No Frequency, urgency, straining or dysuria. NEUROLOGIC- No Numbness, syncope, seizures or burning. [facility]- Pt says she is sad, going through a lot of stress right now.  Physical Exam: Blood pressure 106/67, pulse 90, temperature 97.7 F (36.5 C), temperature source Oral, resp. rate 16, height 5\' 6"  (1.676 m), weight 383 lb 6.1 oz (173.9 kg), last menstrual period 05/05/2012, SpO2 100 %. GENERAL- alert, co-operative, appears as stated age, not in any distress. HEENT- Atraumatic, normocephalic, PERRL, EOMI, oral mucosa appears moist, neck supple, Could not appreciated JVD as pt was sitting in a wheelchair, is obese. CARDIAC- RRR, no murmurs, rubs or gallops apprecaited. RESP- Normal work of breathing, no crackles or wheezing apprecaited. ABDOMEN- Soft, obese, distended but not tense, nontender,  no palpable masses or organomegaly, bowel sounds not appreciatedde to body habitus. BACK- Normal curvature of the spine, No tenderness along the vertebrae, no CVA tenderness. NEURO- No obvious Cr N abnormality, strenght upper and lower extremities- 5/5, on wheel chair. EXTREMITIES- Bandage wrapping bilat lower extremity to proximal fore foot.  SKIN- Warm, dry, No rash or lesion. PSYCH- Normal mood and affect, appropriate thought content and speech.  Lab results: Basic Metabolic Panel:  Recent Labs  05/07/2012 1917  NA 141  K 3.2*  CL 95*  CO2 32  GLUCOSE 100*  BUN 20  CREATININE 1.28*  CALCIUM 9.5  MG 1.7   Liver Function Tests:  Recent Labs  03/24/14 1917  AST 31  ALT 35  ALKPHOS 153*  BILITOT 0.4  PROT 8.0  ALBUMIN 3.3*   CBC:  Recent Labs  03/24/14 1917  WBC 5.3  HGB 11.1*  HCT 36.1  MCV 84.9  PLT 249   Cardiac Enzymes:  Recent Labs  03/24/14 1917  TROPONINI <0.30    BNP:  Recent Labs  03/24/14 1917  PROBNP 228.5*   Imaging results:  No results found.  Other results: EKG: pending  Assessment & Plan by Problem: Active Problems:   CHF exacerbation   OSA (obstructive sleep apnea)   Hypertension   Depression   Nonischemic cardiomyopathy   CKD (chronic kidney disease) stage 3, GFR 30-59 ml/min   Controlled type 2 diabetes mellitus with diabetic nephropathy  CHF exacerbation- Most likey due to medication non compliance and dietary indescretion. Last ECHO, poor image quality but estimated EF- 06/2013- 50l-55%, dilated and hypokinetic RV.  Per chart- pts weight has been steadily increasing over the past 5 months at least, but recorded weight- 11/2013- 260s. Pt weight today- 383Lbs. SOB likely related to abdominal distension and therefore respiratory compromise, as no crackles on exam. - Admit to tele - CMET - CBC - Chest  xray - Pro-bnp- 228.5, as high as 559.6 in th epast - EKG - Cards Following, recs apprecaited - Start Lasix 80mg  TID- Per cards. - Will Hold BP meds tonight as Bp soft- Systolic- 90s, consider restarting tomorrow, might require vasopressors while diuresing- midodrine, dopamine. - Consider repeat ECHO - trop x3 - Not on ASA- Hx of GI bleed- 11/2013. - Cont statin- lipitor- 40 daily - Daily weights - in and out- strict  HTN- Bp 96/78 on examination in clinic. Home meds- Coreg- 12.5, hydralazine- 50mg  TID, Imdur- 60mg  daily, metolazone 5mg  once weekly, spironolactone- 25mg  daily, torsemide- 100mg  BID.  CKD3 - Stable Cr- 1.28, appears to be at baseline. GFR- 55. - Avoid nephrotoxics - Bmet daily while diuresing, replace K as indicated. - Mag level - Cont home mag oxide  Diabetes- Home meds-  Levemir- 50u daily. Last hgba1c- 03/12/2014- 6.5. - Levemir- 30daily,  - SSI-s   Depression with anxiety- Cont home xanax- 0.5mg  QHS, amitryptilline- 150mg  daily.  OSA- CPAP.  DVT ppx- Cont home xarelto- 20mg  by mouth daily for  chronic DVT, also hx of mitral valve repair.  Code- Full.  Dispo: Disposition is deferred at this time, awaiting improvement of current medical problems. Anticipated discharge in approximately 1-2 day(s).   The patient does have a current PCP , MD) and does need an [facility] hospital follow-up appointment after discharge.  The patient does have transportation limitations that hinder transportation to clinic appointments.  Signed: , MD 03/24/2014, 8:13 PM

## 2014-03-25 DIAGNOSIS — F418 Other specified anxiety disorders: Secondary | ICD-10-CM

## 2014-03-25 DIAGNOSIS — N183 Chronic kidney disease, stage 3 (moderate): Secondary | ICD-10-CM

## 2014-03-25 DIAGNOSIS — E1121 Type 2 diabetes mellitus with diabetic nephropathy: Secondary | ICD-10-CM

## 2014-03-25 DIAGNOSIS — G4733 Obstructive sleep apnea (adult) (pediatric): Secondary | ICD-10-CM

## 2014-03-25 DIAGNOSIS — I129 Hypertensive chronic kidney disease with stage 1 through stage 4 chronic kidney disease, or unspecified chronic kidney disease: Secondary | ICD-10-CM

## 2014-03-25 DIAGNOSIS — I509 Heart failure, unspecified: Secondary | ICD-10-CM

## 2014-03-25 DIAGNOSIS — I5033 Acute on chronic diastolic (congestive) heart failure: Secondary | ICD-10-CM

## 2014-03-25 DIAGNOSIS — I059 Rheumatic mitral valve disease, unspecified: Secondary | ICD-10-CM

## 2014-03-25 LAB — BASIC METABOLIC PANEL
Anion gap: 13 (ref 5–15)
BUN: 20 mg/dL (ref 6–23)
CO2: 21 mEq/L (ref 19–32)
Calcium: 10.3 mg/dL (ref 8.4–10.5)
Chloride: 103 mEq/L (ref 96–112)
Creatinine, Ser: 1.04 mg/dL (ref 0.50–1.10)
GFR calc Af Amer: 70 mL/min — ABNORMAL LOW (ref >90)
GFR, EST NON AFRICAN AMERICAN: 61 mL/min — AB (ref >90)
Glucose, Bld: 218 mg/dL — ABNORMAL HIGH (ref 70–99)
POTASSIUM: 5 meq/L (ref 3.7–5.3)
SODIUM: 137 meq/L (ref 137–147)

## 2014-03-25 LAB — TROPONIN I

## 2014-03-25 LAB — GLUCOSE, CAPILLARY
GLUCOSE-CAPILLARY: 170 mg/dL — AB (ref 70–99)
GLUCOSE-CAPILLARY: 173 mg/dL — AB (ref 70–99)
GLUCOSE-CAPILLARY: 160 mg/dL — AB (ref 70–99)
Glucose-Capillary: 208 mg/dL — ABNORMAL HIGH (ref 70–99)

## 2014-03-25 MED ORDER — INSULIN DETEMIR 100 UNIT/ML ~~LOC~~ SOLN
35.0000 [IU] | Freq: Every day | SUBCUTANEOUS | Status: DC
  Administered 2014-03-25: 35 [IU] via SUBCUTANEOUS
  Filled 2014-03-25 (×2): qty 0.35

## 2014-03-25 MED ORDER — CARVEDILOL 12.5 MG PO TABS
12.5000 mg | ORAL_TABLET | Freq: Two times a day (BID) | ORAL | Status: DC
  Administered 2014-03-25 – 2014-03-28 (×7): 12.5 mg via ORAL
  Filled 2014-03-25 (×8): qty 1

## 2014-03-25 MED ORDER — INSULIN ASPART 100 UNIT/ML ~~LOC~~ SOLN
0.0000 [IU] | Freq: Three times a day (TID) | SUBCUTANEOUS | Status: DC
Start: 2014-03-25 — End: 2014-03-28
  Administered 2014-03-25 (×2): 4 [IU] via SUBCUTANEOUS
  Administered 2014-03-26: 11 [IU] via SUBCUTANEOUS
  Administered 2014-03-26: 4 [IU] via SUBCUTANEOUS
  Administered 2014-03-27: 3 [IU] via SUBCUTANEOUS
  Administered 2014-03-27: 4 [IU] via SUBCUTANEOUS
  Administered 2014-03-27: 7 [IU] via SUBCUTANEOUS
  Administered 2014-03-28: 4 [IU] via SUBCUTANEOUS
  Administered 2014-03-28: 3 [IU] via SUBCUTANEOUS

## 2014-03-25 MED ORDER — METOLAZONE 2.5 MG PO TABS
2.5000 mg | ORAL_TABLET | Freq: Two times a day (BID) | ORAL | Status: DC
  Administered 2014-03-25 (×2): 2.5 mg via ORAL
  Filled 2014-03-25 (×4): qty 1

## 2014-03-25 MED ORDER — ISOSORBIDE MONONITRATE ER 60 MG PO TB24: 90.0000 mg | ORAL_TABLET | Freq: Every day | ORAL | Status: DC

## 2014-03-25 MED ORDER — FEBUXOSTAT 40 MG PO TABS
40.0000 mg | ORAL_TABLET | Freq: Every day | ORAL | Status: DC
  Administered 2014-03-25 – 2014-03-28 (×4): 40 mg via ORAL
  Filled 2014-03-25 (×4): qty 1

## 2014-03-25 MED ORDER — PREDNISONE 20 MG PO TABS
40.0000 mg | ORAL_TABLET | Freq: Every day | ORAL | Status: AC
  Administered 2014-03-25 – 2014-03-27 (×3): 40 mg via ORAL
  Filled 2014-03-25 (×3): qty 2

## 2014-03-25 MED ORDER — MAGNESIUM SULFATE 2 GM/50ML IV SOLN
2.0000 g | Freq: Once | INTRAVENOUS | Status: AC
  Administered 2014-03-25: 2 g via INTRAVENOUS
  Filled 2014-03-25: qty 50

## 2014-03-25 MED ORDER — POLYETHYLENE GLYCOL 3350 17 G PO PACK
17.0000 g | PACK | Freq: Every day | ORAL | Status: DC
  Administered 2014-03-25 – 2014-03-28 (×4): 17 g via ORAL
  Filled 2014-03-25 (×4): qty 1

## 2014-03-25 NOTE — Progress Notes (Signed)
Subjective:  Patient is feeling well with no shortness of breath today, though she continues to feel some pain in her left knee. Otherwise she denies chest pain, palpitations, or other concerning symptoms.  Objective: Vital signs in last 24 hours: Filed Vitals:   03/24/14 2205 03/25/14 0219 03/25/14 0639 03/25/14 1004  BP: 130/77 115/79 95/53 138/61  Pulse: 87 100 98 98  Temp: 98.2 F (36.8 C)     TempSrc: Oral     Resp: 18 20 18 16   Height:      Weight:   174.272 kg (384 lb 3.2 oz)   SpO2: 98% 97% 96% 98%   Weight change:   Intake/Output Summary (Last 24 hours) at 03/25/14 1122 Last data filed at 03/25/14 1057  Gross per 24 hour  Intake   1390 ml  Output   2175 ml  Net   -785 ml   General appearance: alert, cooperative, appears stated age and no distress Neck: JVP difficult to assess due to patient's body habitus Back: negative Lungs: clear to auscultation bilaterally and normal percussion bilaterally Heart: regular rate and rhythm, S1, S2 normal, no murmur, click, rub or gallop Abdomen: soft, non-tender; bowel sounds normal; no masses,  no organomegaly Extremities: lower extremities are wrapped and difficult to assess, however no pitting edema noted over below the knee. Pulses: 2+ and symmetric  Lab Results: Results for orders placed or performed during the hospital encounter of 03/24/14 (from the past 24 hour(s))  Pro b natriuretic peptide     Status: Abnormal   Collection Time: 03/24/14  7:17 PM  Result Value Ref Range   Pro B Natriuretic peptide (BNP) 228.5 (H) 0 - 125 pg/mL  Comprehensive metabolic panel     Status: Abnormal   Collection Time: 03/24/14  7:17 PM  Result Value Ref Range   Sodium 141 137 - 147 mEq/L   Potassium 3.2 (L) 3.7 - 5.3 mEq/L   Chloride 95 (L) 96 - 112 mEq/L   CO2 32 19 - 32 mEq/L   Glucose, Bld 100 (H) 70 - 99 mg/dL   BUN 20 6 - 23 mg/dL   Creatinine, Ser 13/09/15 (H) 0.50 - 1.10 mg/dL   Calcium 9.5 8.4 - 6.41 mg/dL   Total Protein 8.0  6.0 - 8.3 g/dL   Albumin 3.3 (L) 3.5 - 5.2 g/dL   AST 31 0 - 37 U/L   ALT 35 0 - 35 U/L   Alkaline Phosphatase 153 (H) 39 - 117 U/L   Total Bilirubin 0.4 0.3 - 1.2 mg/dL   GFR calc non Af Amer 47 (L) >90 mL/min   GFR calc Af Amer 55 (L) >90 mL/min   Anion gap 14 5 - 15  Magnesium     Status: None   Collection Time: 03/24/14  7:17 PM  Result Value Ref Range   Magnesium 1.7 1.5 - 2.5 mg/dL  CBC     Status: Abnormal   Collection Time: 03/24/14  7:17 PM  Result Value Ref Range   WBC 5.3 4.0 - 10.5 K/uL   RBC 4.25 3.87 - 5.11 MIL/uL   Hemoglobin 11.1 (L) 12.0 - 15.0 g/dL   HCT 13/09/15 09.4 - 07.6 %   MCV 84.9 78.0 - 100.0 fL   MCH 26.1 26.0 - 34.0 pg   MCHC 30.7 30.0 - 36.0 g/dL   RDW 80.8 (H) 81.1 - 03.1 %   Platelets 249 150 - 400 K/uL  Troponin I     Status: None  Collection Time: 03/24/14  7:17 PM  Result Value Ref Range   Troponin I <0.30 <0.30 ng/mL  Glucose, capillary     Status: Abnormal   Collection Time: 03/24/14 10:05 PM  Result Value Ref Range   Glucose-Capillary 184 (H) 70 - 99 mg/dL   Comment 1 Notify RN    Comment 2 Documented in Chart   Troponin I     Status: None   Collection Time: 03/25/14 12:46 AM  Result Value Ref Range   Troponin I <0.30 <0.30 ng/mL  Glucose, capillary     Status: Abnormal   Collection Time: 03/25/14  6:35 AM  Result Value Ref Range   Glucose-Capillary 170 (H) 70 - 99 mg/dL   Comment 1 Documented in Chart    Comment 2 Notify RN   Troponin I     Status: None   Collection Time: 03/25/14  7:30 AM  Result Value Ref Range   Troponin I <0.30 <0.30 ng/mL  Basic metabolic panel     Status: Abnormal   Collection Time: 03/25/14  7:30 AM  Result Value Ref Range   Sodium 137 137 - 147 mEq/L   Potassium 5.0 3.7 - 5.3 mEq/L   Chloride 103 96 - 112 mEq/L   CO2 21 19 - 32 mEq/L   Glucose, Bld 218 (H) 70 - 99 mg/dL   BUN 20 6 - 23 mg/dL   Creatinine, Ser 0.97 0.50 - 1.10 mg/dL   Calcium 35.3 8.4 - 29.9 mg/dL   GFR calc non Af Amer 61 (L) >90  mL/min   GFR calc Af Amer 70 (L) >90 mL/min   Anion gap 13 5 - 15    Micro Results: No results found for this or any previous visit (from the past 240 hour(s)). Studies/Results: X-ray Chest Pa And Lateral  03/24/2014   CLINICAL DATA:  Shortness of breath.  Congestive heart failure.  EXAM: CHEST  2 VIEW  COMPARISON:  PA and lateral chest 08/20/2013 in 09/14/2012. CT chest 08/10/2012.  FINDINGS: AICD remains in place. There is cardiomegaly and likely mild interstitial edema. No definite consolidative process is identified although the study is limited by the patient's size. No pneumothorax or pleural effusion.  IMPRESSION: Cardiomegaly and likely mild interstitial edema.   Electronically Signed   By: Drusilla Kanner M.D.   On: 03/24/2014 22:18   Medications: I have reviewed the patient's current medications. Scheduled Meds: . ALPRAZolam  0.5 mg Oral QHS  . amitriptyline  150 mg Oral QHS  . atorvastatin  40 mg Oral Daily  . febuxostat  40 mg Oral Daily  . furosemide  80 mg Intravenous 3 times per day  . gabapentin  100 mg Oral TID  . insulin aspart  0-20 Units Subcutaneous TID WC  . insulin detemir  30 Units Subcutaneous Q2200  . magnesium oxide  400 mg Oral BID  . magnesium sulfate 1 - 4 g bolus IVPB  2 g Intravenous Once  . methotrexate  15 mg Oral Weekly  . pantoprazole  80 mg Oral Daily  . predniSONE  40 mg Oral Q breakfast  . rivaroxaban  20 mg Oral Q supper  . sodium chloride  3 mL Intravenous Q12H   Continuous Infusions:  PRN Meds:.albuterol, HYDROcodone-acetaminophen Assessment/Plan: Active Problems:   OSA (obstructive sleep apnea)   Hypertension   Depression   Nonischemic cardiomyopathy   CKD (chronic kidney disease) stage 3, GFR 30-59 ml/min   Controlled type 2 diabetes mellitus with diabetic nephropathy  CHF exacerbation  CHF exacerbation- Most likey due to medication non compliance and dietary indescretion. Last ECHO, poor image quality but estimated EF- 06/2013-  50l-55%, dilated and hypokinetic RV.  Per chart- pts weight has been steadily increasing over the past 5 months at least, but recorded weight- 11/2013- 360s. Pt weight today- 383Lbs. SOB likely related to abdominal distension and therefore respiratory compromise, as no crackles on exam.Chest x-ray was poor quality due to body habitus. Pro-BNP at 228.5, EKG and troponins normal. - Cards Following, recs apprecaited - Start Lasix 80mg  TID- Per cards. - Restart Carvedilol 12.5mg  BID, hold rest of BP meds - Cont statin- lipitor- 40 daily - Daily weights - in and out- strict  HTN- Bp 96/78 on examination in clinic. Home meds- Coreg- 12.5, hydralazine- 50mg  TID, Imdur- 60mg  daily, metolazone 5mg  once weekly, spironolactone- 25mg  daily, torsemide- 100mg  BID. -As above  CKD3 - Stable Cr- 1.28, appears to be at baseline. GFR- 55. - Avoid nephrotoxics - Bmet daily while diuresing, replace K as indicated. - Mag level - Cont home mag oxide  Diabetes- Home meds- Levemir- 50u daily. Last hgba1c- 03/12/2014- 6.5. - Levemir- increase to 35 from 30,  - SSI-s   Depression with anxiety- Cont home xanax- 0.5mg  QHS, amitryptilline- 150mg  daily.  OSA- CPAP.  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  Code- Full.  Dispo: Disposition is deferred at this time, awaiting improvement of current medical problems. Anticipated discharge in approximately 1-2 day(s).   The patient does have a current PCP , MD) and does need an [facility] hospital follow-up appointment after discharge.  The patient does have transportation limitations that hinder transportation to clinic appointments.  This is a Note.  The care of the patient was discussed with Dr. and the assessment and plan formulated with their assistance.  Please see their attached note for official documentation of the daily encounter.   LOS: 1 day   ,  Med Student 03/25/2014, 11:22 AM

## 2014-03-25 NOTE — Progress Notes (Signed)
1800 resting comfortaly in chair. No complaint presented

## 2014-03-25 NOTE — Progress Notes (Signed)
UR completed Lupita Rosales K. Harlyn Rathmann, RN, BSN, MSHL, CCM  03/25/2014 1:45 PM

## 2014-03-25 NOTE — Progress Notes (Signed)
RT setup CPAP at bedside. Humidity chamber filled with water. Medium FFM setup and patient placed on Auto titration 20/5. Pt stated that she wears a CPAP at home and knows how to turn the machine on and will do so when she is ready for bed. RN at bedside. RT will monitor

## 2014-03-25 NOTE — Progress Notes (Signed)
Subjective: Pt lying in bed, appears comfortable. No events overnight. Bp improved over the night.   Objective: Vital signs in last 24 hours: Filed Vitals:   03/24/14 2205 03/25/14 0219 03/25/14 0639 03/25/14 1004  BP: 130/77 115/79 95/53 138/61  Pulse: 87 100 98 98  Temp: 98.2 F (36.8 C)     TempSrc: Oral     Resp: 18 20 18 16   Height:      Weight:   384 lb 3.2 oz (174.272 kg)   SpO2: 98% 97% 96% 98%   Weight change:   Intake/Output Summary (Last 24 hours) at 03/25/14 1111 Last data filed at 03/25/14 1057  Gross per 24 hour  Intake   1390 ml  Output   2175 ml  Net   -785 ml   Physical exam- GENERAL- alert, not in any distress. HEENT- Atraumatic, normocephalic,  neck supple. CARDIAC- RRR, no added sounds. RESP- Moving equal volumes of air, and clear to auscultation bilaterally, no wheezes or crackles. ABDOMEN- Soft, nontender, obese, bowel sounds present. NEURO- No obvious Cr N abnormality EXTREMITIES- Bandage wrappings on leg PSYCH- Normal mood and affect, appropriate thought content and speech.  Lab Results: Basic Metabolic Panel:  Recent Labs Lab 03/24/14 1917 03/25/14 0730  NA 141 137  K 3.2* 5.0  CL 95* 103  CO2 32 21  GLUCOSE 100* 218*  BUN 20 20  CREATININE 1.28* 1.04  CALCIUM 9.5 10.3  MG 1.7  --    Liver Function Tests:  Recent Labs Lab 03/24/14 1917  AST 31  ALT 35  ALKPHOS 153*  BILITOT 0.4  PROT 8.0  ALBUMIN 3.3*   CBC:  Recent Labs Lab 03/24/14 1917  WBC 5.3  HGB 11.1*  HCT 36.1  MCV 84.9  PLT 249   Cardiac Enzymes:  Recent Labs Lab 03/24/14 1917 03/25/14 0046 03/25/14 0730  TROPONINI <0.30 <0.30 <0.30   BNP:  Recent Labs Lab 03/24/14 1917  PROBNP 228.5*   CBG:  Recent Labs Lab 03/24/14 2205 03/25/14 0635  GLUCAP 184* 170*   Studies/Results: X-ray Chest Pa And Lateral 03/24/2014   CLINICAL DATA:  Shortness of breath.  IMPRESSION: Cardiomegaly and likely mild interstitial edema.   Electronically  Signed   By: 13/01/2014 M.D.   On: 03/24/2014 22:18   Medications: I have reviewed the patient's current medications. Scheduled Meds: . ALPRAZolam  0.5 mg Oral QHS  . amitriptyline  150 mg Oral QHS  . atorvastatin  40 mg Oral Daily  . febuxostat  40 mg Oral Daily  . furosemide  80 mg Intravenous 3 times per day  . gabapentin  100 mg Oral TID  . insulin aspart  0-20 Units Subcutaneous TID WC  . insulin detemir  30 Units Subcutaneous Q2200  . magnesium oxide  400 mg Oral BID  . magnesium sulfate 1 - 4 g bolus IVPB  2 g Intravenous Once  . methotrexate  15 mg Oral Weekly  . pantoprazole  80 mg Oral Daily  . predniSONE  40 mg Oral Q breakfast  . rivaroxaban  20 mg Oral Q supper  . sodium chloride  3 mL Intravenous Q12H   Continuous Infusions:  PRN Meds:.albuterol, HYDROcodone-acetaminophen Assessment/Plan: Active Problems:   CHF exacerbation   OSA (obstructive sleep apnea)   Hypertension   Depression   Nonischemic cardiomyopathy   CKD (chronic kidney disease) stage 3, GFR 30-59 ml/min   Controlled type 2 diabetes mellitus with diabetic nephropathy  CHF exacerbation- Likely exercerbated by dietary  indescretion.  Last ECHO, poor image quality but estimated EF- 06/2013- 50l-55%, dilated and hypokinetic RV. Per chart- pts weight has been steadily increasing over the past 5 months at least, some of which might be due to fat as she does admit to eating anything she wants. Recorded weight- 11/2013- 260s. Pt weight on admission- 383Lbs. SOB likely related to abdominal distension and therefore respiratory compromise, as no crackles on exam. Chest xray- poor inspiratory effort. Pro-bnp- unremarkable. - Daily BMP - Cards Following, recs apprecaited -On IV lasix 80mg  TID- Per cards, no response so far, might need increased dose. - Bp improved, restart BB today, gradually add on the rest. - Will repeat chest xray, and then consider if repeat ECHO needed, so far no indication. - trop x3-  neg - Not on ASA- Hx of GI bleed- 11/2013. - Cont statin- lipitor- 40 daily - Daily weights- 384 today. - in and out- strict  HTN- Bp 96/78 on examination in clinic. Home meds- Coreg- 12.5, hydralazine- 50mg  TID, Imdur- 60mg  daily, metolazone 5mg  once weekly, spironolactone- 25mg  daily, torsemide- 100mg  BID. - restart home coreg- 12.5mg  BID  CKD3 - Stable Cr- 1.28, appears to be at baseline. GFR- 55. - Avoid nephrotoxics - Bmet daily while diuresing, replace K as indicated. Presently on  - Mag level - Cont home mag oxide  Diabetes- Home meds- Levemir- 50u daily. Last hgba1c- 03/12/2014- 6.5. - Increase Levemir- 35 daily,  - SSI-R   Depression with anxiety- Cont home xanax- 0.5mg  QHS, amitryptilline- 150mg  daily.  OSA- CPAP.  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  Code- Full.   Dispo: Disposition is deferred at this time, awaiting improvement of current medical problems.  Anticipated discharge in approximately 1-2 day(s).   The patient does have a current PCP , MD) and does need an [facility] hospital follow-up appointment after discharge.  The patient does have transportation limitations that hinder transportation to clinic appointments.  .Services Needed at time of discharge: Y = Yes, Blank = No PT:   OT:   RN:   Equipment:   Other:     LOS: 1 day   , MD 03/25/2014, 11:11 AM

## 2014-03-25 NOTE — Plan of Care (Signed)
Problem: Consults Goal: Heart Failure Patient Education (See Patient Education module for education specifics.) Outcome: Progressing     

## 2014-03-25 NOTE — Progress Notes (Signed)
[facility]  Patient Status: Active (receiving services up to time of hospitalization)  [facility] is providing the following services: RN  If patient discharges after hours, please call [phone].   [name] 03/25/2014, 9:14 AM

## 2014-03-25 NOTE — Plan of Care (Signed)
Problem: Phase II Progression Outcomes Goal: Pain controlled Outcome: Adequate for Discharge Goal: Dyspnea controlled with activity Outcome: Adequate for Discharge Goal: Walk in hall or up in chair TID Outcome: Progressing Goal: Discharge plan established Outcome: Progressing Goal: Tolerating diet Outcome: Adequate for Discharge Goal: Fluid volume status improved Outcome: Progressing Goal: Begin discharge teaching Outcome: Progressing

## 2014-03-25 NOTE — Care Management Note (Addendum)
    Page 1 of 2   03/31/2014     3:16:12 PM CARE MANAGEMENT NOTE 03/31/2014  Patient:  Tasha, Lyons   Account Number:  [id number]  Date Initiated:  03/25/2014  Documentation initiated by:  Donato Schultz  Subjective/Objective Assessment:   CHF     Action/Plan:   CM to follow for disposiition needs   Anticipated DC Date:  03/26/2014   Anticipated DC Plan:  HOME W HOME HEALTH SERVICES      DC Planning Services  CM consult      [facility] Choice  HOME HEALTH   Choice offered to / List presented to:  C-1 Patient   DME arranged  [facility] BED      DME agency  [facility]     [facility] arranged  HH-1 RN  HH-10 DISEASE MANAGEMENT  HH-2 PT  HH-3 OT      [facility] agency  [facility]   Status of service:  Completed, signed off Medicare Important Message given?  YES (If response is "NO", the following Medicare IM given date fields will be blank) Date Medicare IM given:  03/28/2014 Medicare IM given by:  Saina Waage Date Additional Medicare IM given:   Additional Medicare IM given by:    Discharge Disposition:  HOME W HOME HEALTH SERVICES  Per UR Regulation:  Reviewed for med. necessity/level of care/duration of stay  If discussed at Long Length of Stay Meetings, dates discussed:    Comments:  Tasha Cressman RN, BSN, MSHL, CCM  Nurse - Case Manager,  (Unit [place])  [phone]  03/31/2014 TC from [facility] SW/Matthew for clarification on PCP. States plan to initiate FL2 for PCP to sign.  Patient has agreed to SNF placement.    Tasha Butsch RN, BSN, MSHL, CCM  Nurse - Case Manager,  (Unit [phone]  03/26/2014 Dispo Plan:  home with HHS:  RN, PT, OT  ([facility]/Donna notified) Hospital Bed and Cane ordered with [facility]/Jermaine CM will continue to monitor for disposition needs as indicated    Tasha Parkin RN, BSN, MSHL, CCM  Nurse - Case Manager,  (Unit [place])  [phone]  03/26/2014 Social:  from home with DTR.   Fall  risk Home DME:  [facility]:  active PT RECS:  HH PT Past HHS with [facility] Dispo Plan:  home with HHS:  RN, PT ([facility] notified) CM will continue to monitor for disposition needs as indicated   Hx/o 2 admissions over the past 6 months Disease MGMT:  CHF Medication Reconciliation [facility] Discharge Medication MGMT:  Assess, monitor and teach compliance Monitor compliance with all MD appt's PCP:  Dr. Harold Barban

## 2014-03-25 NOTE — Telephone Encounter (Signed)
As requested refills sent into pharmacy 

## 2014-03-25 NOTE — Consult Note (Signed)
Advanced Heart Failure Team History and Physical Note   Primary Physician: PCP: Dr. Loma Newton (Internal Medicine) Reason for Admission: Volume Overload   HPI:    53 yo with CHF probably secondary to severe MR now s/p MV repair, rheumatoid arthritis, schizophrenia, overdose of seroquel 2013, S/P St Jude ICD 2013, and PUD. EF initially low, but most recent echo in 1/15 showed EF 50-55%, stable MV repair, RV dilated and hypokinetic but difficult images. Newly diagnosed DM2, Hgb A1C 13% in April 2015. CKD (Cr 1.4-1.7).  Followed by paramedicine and HHRN. Has been getting IV lasix at home and reports being in a very stressful home situation. Not taking mediations as prescribed and drinking more than 2L and eating lots of salty foods. Seen in clinic on 10/9 and admitted for volume overload.  Review of Systems: [y] = yes, [ ]  = no   General: Weight gain [Y ]; Weight loss [ ] ; Anorexia [ ] ; Fatigue [ ] ; Fever [ ] ; Chills [ ] ; Weakness [ ]   Cardiac: Chest pain/pressure [ ] ; Resting SOB [Y ]; Exertional SOB [Y ]; Orthopnea [Y ]; Pedal Edema [Y ]; Palpitations [ ] ; Syncope [ ] ; Presyncope [ ] ; Paroxysmal nocturnal dyspnea[ ]   Pulmonary: Cough [ ] ; Wheezing[ ] ; Hemoptysis[ ] ; Sputum [ ] ; Snoring [ ]   GI: Vomiting[ ] ; Dysphagia[ ] ; Melena[ ] ; Hematochezia [ ] ; Heartburn[ ] ; Abdominal pain [ ] ; Constipation [ ] ; Diarrhea [ ] ; BRBPR [ ]   GU: Hematuria[ ] ; Dysuria [ ] ; Nocturia[ ]   Vascular: Pain in legs with walking [ ] ; Pain in feet with lying flat [ ] ; Non-healing sores [ ] ; Stroke [ ] ; TIA [ ] ; Slurred speech [ ] ;  Neuro: Headaches[ ] ; Vertigo[ ] ; Seizures[ ] ; Paresthesias[ ] ;Blurred vision [ ] ; Diplopia [ ] ; Vision changes [ ]   Ortho/Skin: Arthritis [ ] ; Joint pain [Y ]; Muscle pain [ ] ; Joint swelling [ ] ; Back Pain [ ] ; Rash [ ]   Psych: Depression[ Y]; Anxiety[ Y]  Heme: Bleeding problems [ ] ; Clotting disorders [ ] ; Anemia [ ]   Endocrine: Diabetes [ ] ; Thyroid dysfunction[ ]   Home Medications Prior to  Admission medications   Medication Sig Start Date End Date Taking? Authorizing Provider  ALPRAZolam ) 0.5 MG tablet Take 1 tablet (0.5 mg total) by mouth at bedtime. 03/14/14  Yes , MD  amitriptyline (ELAVIL) 150 MG tablet Take 1 tablet (150 mg total) by mouth at bedtime. 12/20/13  Yes , MD  atorvastatin (LIPITOR) 40 MG tablet Take 1 tablet (40 mg total) by mouth daily. 10/01/13 10/01/14 Yes , MD  carvedilol (COREG) 12.5 MG tablet TAKE ONE & ONE-HALF TABLETS BY MOUTH TWICE DAILY 02/11/14  Yes , MD  diclofenac sodium (VOLTAREN) 1 % GEL Apply 2 g topically 4 (four) times daily. 03/10/14  Yes , MD  febuxostat (ULORIC) 40 MG tablet Take 40 mg by mouth daily.    Yes Historical Provider, MD  ferrous sulfate 325 (65 FE) MG tablet Take 1 tablet (325 mg total) by mouth 3 (three) times daily with meals. 11/22/13  Yes , MD  hydrALAZINE (APRESOLINE) 50 MG tablet TAKE ONE TABLET BY MOUTH THREE TIMES DAILY 02/11/14  Yes , MD  HYDROcodone-acetaminophen (NORCO) 10-325 MG per tablet Take 1 tablet by mouth every 6 (six) hours as needed for severe pain. 03/06/14  Yes , MD  Insulin Detemir (LEVEMIR) 100 UNIT/ML Pen Inject 50 Units into the skin daily at 10  pm. 02/26/14  Yes Inez Catalina, MD  Magnesium Oxide 400 MG CAPS Take 1 capsule (400 mg total) by mouth 2 (two) times daily. 11/23/13  Yes Dionne Ano, MD  methotrexate (RHEUMATREX) 2.5 MG tablet Take 15 mg by mouth once a week. Takes weekly on Monday   Yes Historical Provider, MD  metolazone (ZAROXOLYN) 2.5 MG tablet Take 5 mg by mouth once a week. Every Tuesday 01/27/14  Yes Laurey Morale, MD  nystatin (MYCOSTATIN/NYSTOP) 100000 UNIT/GM POWD Apply 1 Bottle topically 2 (two) times daily. 03/11/14  Yes Harold Barban, MD  omeprazole (PRILOSEC) 40 MG capsule Take 1 capsule (40 mg total) by mouth daily. 03/01/14  Yes Harold Barban, MD  polyethylene glycol  [facility] / GLYCOLAX) packet Take 17 g by mouth daily as needed for moderate constipation. 01/14/14  Yes Inez Catalina, MD  potassium chloride SA (K-DUR,KLOR-CON) 20 MEQ tablet Take 2 tablets (40 mEq total) by mouth 2 (two) times daily. Take on extra 20 meq on Tuesday with Metolazone 01/27/14  Yes Laurey Morale, MD  PROAIR HFA 108 [phone] BASE) MCG/ACT inhaler INHALE TWO PUFFS BY MOUTH EVERY 6 HOURS AS NEEDED FOR  WHEEZING  OR  SHORTNESS  OF  BREATH 02/13/14  Yes Harold Barban, MD  rivaroxaban (XARELTO) 20 MG TABS tablet Take 1 tablet (20 mg total) by mouth daily with supper. 03/05/14  Yes Dolores Patty, MD  spironolactone (ALDACTONE) 25 MG tablet TAKE ONE TABLET BY MOUTH ONCE DAILY 01/27/14  Yes Dolores Patty, MD  torsemide (DEMADEX) 100 MG tablet Take 1 tablet (100 mg total) by mouth 2 (two) times daily. 07/04/13 07/04/14 Yes [name], NP  BD PEN NEEDLE NANO U/F 32G X 4 MM MISC  01/30/14   Historical Provider, MD  gabapentin (NEURONTIN) 100 MG capsule Take 1 capsule (100 mg total) by mouth 3 (three) times daily. 09/30/13 09/30/14  Linward Headland, MD  glucose blood (FREESTYLE LITE) test strip Use to check blood sugar 4 times daily. diag code E11.21. Pt is insulin dependent 03/21/14   Earl Lagos, MD  isosorbide mononitrate (IMDUR) 60 MG 24 hr tablet Take 1.5 tablets (90 mg total) by mouth daily. 03/25/14   Dolores Patty, MD    Past Medical History: Past Medical History  Diagnosis Date  . Severe mitral regurgitation     a. Minimally invasive MV repair on 12/18/08. Additionally TV repair  and PFO closure.  . Chronic systolic CHF (congestive heart failure)     a. 12/2011 Echo: EF 30%, diff HK, mild LVH, Gr 1 DD, mildly dil LA. b. 2/2 NICM.  Marland Kitchen Microcytic anemia     a. Small capsule endoscopy 2012 with gastric erosion and small colonic AVMs. b. EGD and colonoscopy before that in 12/11 did not reveal source of bleeding)  . NICM (nonischemic cardiomyopathy)     a.12/2011: s/p SJM [id number]  single lead ICD, Ser #: [id number]  . GERD (gastroesophageal reflux disease)   . HTN (hypertension)   . Gout   . Depression   . Colitis 2/11  . Chronic back pain   . Chronic knee pain   . DVT (deep venous thrombosis)     "BLE"  . Anxiety   . Sleep disturbance     07/14/11 "haven't slept in 10 months; since going to [facility]"  . Nocturia   . COPD (chronic obstructive pulmonary disease)   . PTSD (post-traumatic stress disorder)   . Dysmenorrhea   . Morbid obesity   .  Acute renal insufficiency     a. ?Cardiorenal 09/2012.  Marland Kitchen Hyperglycemia   . Automatic implantable cardioverter-defibrillator in situ   . Chronic diastolic HF (heart failure)     a. ECHO (05/2013) EF 50-55%, RV dilated and KH  . Chronic bronchitis     "get it q yr"  . Pneumonia X 1  . OSA on CPAP   . IDDM (insulin dependent diabetes mellitus)   . Rheumatoid arthritis(714.0)     "shoulders & knees"  . Chronic lower back pain   . Frequent UTI     Past Surgical History: Past Surgical History  Procedure Laterality Date  . Mitral valve replacement  12/2008  . Cardiac catheterization  04/20/2009, 11/04/2008  . Knee arthroscopy Right 06/11/2004  . Multiple tooth extractions  12/04/2008    "took them all out"  . Right mini thoracotomy for mitral valve repair and closure of [place]man [place] Right 12/18/2008  . Cardiac defibrillator placement    . Tubal ligation  1980    Family History: Family History  Problem Relation Age of Onset  . Hypertension Sister   . Alcoholism Father     died in his 10's or 81's  . Other Mother     alive & well @ 6.  . Colon cancer Neg Hx     Social History: History   Social History  . Marital Status: Single    Spouse Name: N/A    Number of Children: 5  . Years of Education: N/A   Occupational History  . disability    Social History Main Topics  . Smoking status: Former Smoker -- 0.50 packs/day for 27 years    Types: Cigarettes    Quit date: 12/18/2008  . Smokeless tobacco: Never Used   . Alcohol Use: Yes     Comment: 03/24/2014 "drink some on holidays or birthdays"  . Drug Use: Yes    Special: "Crack" cocaine     Comment: "last cocaine use 2008"  . Sexual Activity: No   Other Topics Concern  . None   Social History Narrative   Pt lives in [place] with her dtr.  On disability.    Allergies:  Allergies  Allergen Reactions  . Risperidone Other (See Comments)    Auditory hallucinations.  . Colchicine Hives and Itching  . Morphine Itching  . Shrimp [Shellfish Allergy] Hives and Itching    Objective:    Vital Signs:   Temp:  [97.7 F (36.5 C)-98.2 F (36.8 C)] 98.2 F (36.8 C) (11/09 2205) Pulse Rate:  [87-100] 98 (11/10 0639) Resp:  [16-20] 18 (11/10 0639) BP: (95-130)/(53-79) 95/53 mmHg (11/10 0639) SpO2:  [96 %-100 %] 96 % (11/10 0639) Weight:  [383 lb 6.1 oz (173.9 kg)-388 lb (175.996 kg)] 384 lb 3.2 oz (174.272 kg) (11/10 0639) Last BM Date: 03/22/14 Filed Weights   03/24/14 1739 03/25/14 0639  Weight: 383 lb 6.1 oz (173.9 kg) 384 lb 3.2 oz (174.272 kg)    Physical Exam: General:  Well appearing. No resp difficulty HEENT: normal Neck: supple. JVP difficult to assess d/t body habitus but appears elevated . Carotids 2+ bilat; no bruits. No lymphadenopathy or thryomegaly appreciated. Cor: Nondisplaced PMI. Heart regular S1/S2, no S3/S4, no murmur. Lungs: clear Abdomen: soft, nontender, mildly distended. No hepatosplenomegaly. No bruits or masses. Good bowel sounds. Obese Extremities: no cyanosis, clubbing, rash, 1-2+ bilateral edema Neuro: alert & orientedx3, cranial nerves grossly intact. moves all 4 extremities w/o difficulty. Affect pleasant  Telemetry: SR 90s  Labs:  Basic Metabolic Panel:  Recent Labs Lab 03/24/14 1917 03/25/14 0730  NA 141 137  K 3.2* 5.0  CL 95* 103  CO2 32 21  GLUCOSE 100* 218*  BUN 20 20  CREATININE 1.28* 1.04  CALCIUM 9.5 10.3  MG 1.7  --     Liver Function Tests:  Recent Labs Lab 03/24/14 1917  AST 31   ALT 35  ALKPHOS 153*  BILITOT 0.4  PROT 8.0  ALBUMIN 3.3*   No results for input(s): LIPASE, AMYLASE in the last 168 hours. No results for input(s): AMMONIA in the last 168 hours.  CBC:  Recent Labs Lab 03/24/14 1917  WBC 5.3  HGB 11.1*  HCT 36.1  MCV 84.9  PLT 249    Cardiac Enzymes:  Recent Labs Lab 03/24/14 1917 03/25/14 0046 03/25/14 0730  TROPONINI <0.30 <0.30 <0.30    BNP: BNP (last 3 results)  Recent Labs  12/09/13 1625 01/06/14 1620 03/24/14 1917  PROBNP 553.0* 317.8* 228.5*    CBG:  Recent Labs Lab 03/24/14 2205 03/25/14 0635  GLUCAP 184* 170*    Coagulation Studies: No results for input(s): LABPROT, INR in the last 72 hours.  Other results: EKG: SR with PACs 90s  Imaging: X-ray Chest Pa And Lateral  03/24/2014   CLINICAL DATA:  Shortness of breath.  Congestive heart failure.  EXAM: CHEST  2 VIEW  COMPARISON:  PA and lateral chest 08/20/2013 in 09/14/2012. CT chest 08/10/2012.  FINDINGS: AICD remains in place. There is cardiomegaly and likely mild interstitial edema. No definite consolidative process is identified although the study is limited by the patient's size. No pneumothorax or pleural effusion.  IMPRESSION: Cardiomegaly and likely mild interstitial edema.   Electronically Signed   By: Drusilla Kanner M.D.   On: 03/24/2014 22:18         Assessment:   1) A/C diastolic HF - EF 96-04% but RV dilated with HK (05/2013) 2) CKD stage III 3) DM2 4) Morbidly obese 5) DVT - suspected 03/2013 admission, on Xarelto 6) Depression 7) Hypomagnesemia  Plan/Discussion:    Ms. Fike was admitted from the HF clinic yesterday by Internal Medicine for volume overload (appreciate their help). Her volume status is markedly elevated in the setting of dietary indiscretion.   Volume remains elevated, will continue lasix 80 mg IV TID. Renal function stable and will continue to follow. Stop potassium.   Mag 1.7 on PO supplementation. Will  give 2 gm.   Having knee pain. Will restart uloric and give prednisone 40 mg x 3 days.   Will consult PT/OT for ambulation.  Length of Stay: 1 [name] NP-C 03/25/2014, 10:01 AM  Advanced Heart Failure Team Pager [phone] (M-F; 7a - 4p)  Please contact [facility] for night-coverage after hours (4p -7a ) and weekends on [email or web address]  Patient seen and examined with [name], NP. We discussed all aspects of the encounter. I agree with the assessment and plan as stated above.   She is markedly volume overloaded. Response to lasix less than I expected. Will add metolazone 2.5 bid and see response. Watch electrolytes.   Daniel Bensimhon,MD 11:44 AM

## 2014-03-26 ENCOUNTER — Inpatient Hospital Stay (HOSPITAL_COMMUNITY): Payer: Medicare Other

## 2014-03-26 ENCOUNTER — Ambulatory Visit: Payer: Medicare HMO | Admitting: Internal Medicine

## 2014-03-26 DIAGNOSIS — R0602 Shortness of breath: Secondary | ICD-10-CM | POA: Insufficient documentation

## 2014-03-26 LAB — BASIC METABOLIC PANEL
ANION GAP: 13 (ref 5–15)
BUN: 25 mg/dL — ABNORMAL HIGH (ref 6–23)
CO2: 31 meq/L (ref 19–32)
Calcium: 9.6 mg/dL (ref 8.4–10.5)
Chloride: 95 mEq/L — ABNORMAL LOW (ref 96–112)
Creatinine, Ser: 1.31 mg/dL — ABNORMAL HIGH (ref 0.50–1.10)
GFR calc Af Amer: 53 mL/min — ABNORMAL LOW (ref >90)
GFR, EST NON AFRICAN AMERICAN: 46 mL/min — AB (ref >90)
Glucose, Bld: 195 mg/dL — ABNORMAL HIGH (ref 70–99)
Potassium: 3.2 mEq/L — ABNORMAL LOW (ref 3.7–5.3)
SODIUM: 139 meq/L (ref 137–147)

## 2014-03-26 LAB — GLUCOSE, CAPILLARY
GLUCOSE-CAPILLARY: 173 mg/dL — AB (ref 70–99)
Glucose-Capillary: 262 mg/dL — ABNORMAL HIGH (ref 70–99)
Glucose-Capillary: 160 mg/dL — ABNORMAL HIGH (ref 70–99)

## 2014-03-26 MED ORDER — POTASSIUM CHLORIDE CRYS ER 20 MEQ PO TBCR
40.0000 meq | EXTENDED_RELEASE_TABLET | Freq: Once | ORAL | Status: AC
  Administered 2014-03-26: 40 meq via ORAL
  Filled 2014-03-26: qty 2

## 2014-03-26 MED ORDER — METOLAZONE 2.5 MG PO TABS
2.5000 mg | ORAL_TABLET | Freq: Every day | ORAL | Status: DC
Start: 2014-03-26 — End: 2014-03-28
  Administered 2014-03-26 – 2014-03-28 (×3): 2.5 mg via ORAL
  Filled 2014-03-26 (×3): qty 1

## 2014-03-26 MED ORDER — INSULIN DETEMIR 100 UNIT/ML ~~LOC~~ SOLN
40.0000 [IU] | Freq: Every day | SUBCUTANEOUS | Status: DC
  Administered 2014-03-26 – 2014-03-27 (×2): 40 [IU] via SUBCUTANEOUS
  Filled 2014-03-26 (×3): qty 0.4

## 2014-03-26 NOTE — Progress Notes (Signed)
Patient ID: [name], female   DOB: 12/11/1960, 53 y.o.   MRN: [id number]   SUBJECTIVE: Breathing easier.  She diuresed well yesterday.  Mild increase in creatinine.   Scheduled Meds: . ALPRAZolam  0.5 mg Oral QHS  . amitriptyline  150 mg Oral QHS  . atorvastatin  40 mg Oral Daily  . carvedilol  12.5 mg Oral BID WC  . febuxostat  40 mg Oral Daily  . furosemide  80 mg Intravenous 3 times per day  . gabapentin  100 mg Oral TID  . insulin aspart  0-20 Units Subcutaneous TID WC  . insulin detemir  35 Units Subcutaneous Q2200  . magnesium oxide  400 mg Oral BID  . methotrexate  15 mg Oral Weekly  . metolazone  2.5 mg Oral Daily  . pantoprazole  80 mg Oral Daily  . polyethylene glycol  17 g Oral Daily  . potassium chloride  40 mEq Oral Once  . predniSONE  40 mg Oral Q breakfast  . rivaroxaban  20 mg Oral Q supper  . sodium chloride  3 mL Intravenous Q12H   Continuous Infusions:  PRN Meds:.albuterol, HYDROcodone-acetaminophen    Filed Vitals:   03/25/14 1500 03/25/14 2154 03/25/14 2214 03/26/14 0620  BP: 116/60 124/65  128/77  Pulse: 96 98 98 92  Temp: 97.7 F (36.5 C) 98.3 F (36.8 C)  97.9 F (36.6 C)  TempSrc: Oral Oral  Oral  Resp: 16 20 18 20   Height:      Weight:    378 lb 14.4 oz (171.868 kg)  SpO2: 100% 98% 98% 99%    Intake/Output Summary (Last 24 hours) at 03/26/14 0819 Last data filed at 03/26/14 0600  Gross per 24 hour  Intake   1543 ml  Output   3550 ml  Net  -2007 ml    LABS: Basic Metabolic Panel:  Recent Labs  13/11/15 1917 03/25/14 0730 03/26/14 0505  NA 141 137 139  K 3.2* 5.0 3.2*  CL 95* 103 95*  CO2 32 21 31  GLUCOSE 100* 218* 195*  BUN 20 20 25*  CREATININE 1.28* 1.04 1.31*  CALCIUM 9.5 10.3 9.6  MG 1.7  --   --    Liver Function Tests:  Recent Labs  03/24/14 1917  AST 31  ALT 35  ALKPHOS 153*  BILITOT 0.4  PROT 8.0  ALBUMIN 3.3*   No results for input(s): LIPASE, AMYLASE in the last 72 hours. CBC:  Recent  Labs  03/24/14 1917  WBC 5.3  HGB 11.1*  HCT 36.1  MCV 84.9  PLT 249   Cardiac Enzymes:  Recent Labs  03/24/14 1917 03/25/14 0046 03/25/14 0730  TROPONINI <0.30 <0.30 <0.30   BNP: Invalid input(s): POCBNP D-Dimer: No results for input(s): DDIMER in the last 72 hours. Hemoglobin A1C: No results for input(s): HGBA1C in the last 72 hours. Fasting Lipid Panel: No results for input(s): CHOL, HDL, LDLCALC, TRIG, CHOLHDL, LDLDIRECT in the last 72 hours. Thyroid Function Tests: No results for input(s): TSH, T4TOTAL, T3FREE, THYROIDAB in the last 72 hours.  Invalid input(s): FREET3 Anemia Panel: No results for input(s): VITAMINB12, FOLATE, FERRITIN, TIBC, IRON, RETICCTPCT in the last 72 hours.  RADIOLOGY: X-ray Chest Pa And Lateral  03/24/2014   CLINICAL DATA:  Shortness of breath.  Congestive heart failure.  EXAM: CHEST  2 VIEW  COMPARISON:  PA and lateral chest 08/20/2013 in 09/14/2012. CT chest 08/10/2012.  FINDINGS: AICD remains in place. There is cardiomegaly and likely  mild interstitial edema. No definite consolidative process is identified although the study is limited by the patient's size. No pneumothorax or pleural effusion.  IMPRESSION: Cardiomegaly and likely mild interstitial edema.   Electronically Signed   By: Drusilla Kanner M.D.   On: 03/24/2014 22:18    PHYSICAL EXAM General: NAD, obese Neck: Thick, JVP difficult but estimate 14 cm, no thyromegaly or thyroid nodule.  Lungs: Decreased breath sounds at bases bilaterally CV: Nondisplaced PMI.  Heart regular S1/S2, no S3/S4, no murmur.  1+ ankle edema.   Abdomen: Soft, nontender, no hepatosplenomegaly, no distention.  Neurologic: Alert and oriented x 3.  Psych: Normal affect. Extremities: No clubbing or cyanosis. Lower legs wrapped.   TELEMETRY: Reviewed telemetry pt in NSR   ] Assessment:   1) A/C diastolic HF - EF 79-48% but RV dilated with HK (05/2013) 2) CKD stage III 3) DM2 4) Morbidly obese 5)  h/o DVT - suspected 03/2013 admission, on Xarelto 6) Depression 7) Hypomagnesemia 8) MV repair: Stable on 1/15 echo.  9) Gout  Plan/Discussion:    [name] was admitted from the HF clinic by Internal Medicine for volume overload (appreciate their help). Her volume status was markedly elevated in the setting of dietary indiscretion.   Volume remains elevated but diuresed well yesterday and weight down.  Will continue lasix 80 mg IV TID. Creatinine mildly elevated today, will cut back on metolazone to once daily.    Will give K supplementation.   Having knee pain, suspect gout.  Now on Uloric, starte prednisone 40 mg x 3 days.   Will consult PT/OT for ambulation.  [name] 03/26/2014 8:23 AM

## 2014-03-26 NOTE — Progress Notes (Signed)
Subjective:  Patient states she has less shortness of breath today, has been urinating frequently as expected. Otherwise she denies symptoms of chest pain, palpitations, pain on inspiration, abdominal pain, or other concerning symptoms. She is still experiencing significant knee pain.  Objective: Vital signs in last 24 hours: Filed Vitals:   03/25/14 1500 03/25/14 2154 03/25/14 2214 03/26/14 0620  BP: 116/60 124/65  128/77  Pulse: 96 98 98 92  Temp: 97.7 F (36.5 C) 98.3 F (36.8 C)  97.9 F (36.6 C)  TempSrc: Oral Oral  Oral  Resp: 16 20 18 20   Height:      Weight:    171.868 kg (378 lb 14.4 oz)  SpO2: 100% 98% 98% 99%   Weight change: -2.032 kg (-4 lb 7.7 oz)  Intake/Output Summary (Last 24 hours) at 03/26/14 1124 Last data filed at 03/26/14 0600  Gross per 24 hour  Intake   1083 ml  Output   2625 ml  Net  -1542 ml   General appearance: alert, cooperative, appears stated age and no distress Neck: JVP difficult to assess due to patient's body habitus Back: negative Lungs: clear to auscultation bilaterally and normal percussion bilaterally Heart: regular rate and rhythm, S1, S2 normal, no murmur, click, rub or gallop Abdomen: obese, non-tender; bowel sounds normal Extremities: lower extremities are wrapped and difficult to assess, however no pitting edema noted over below the knee. Pulses: 2+ and symmetric  Lab Results: Results for orders placed or performed during the hospital encounter of 03/24/14 (from the past 24 hour(s))  Glucose, capillary     Status: Abnormal   Collection Time: 03/25/14 12:12 PM  Result Value Ref Range   Glucose-Capillary 173 (H) 70 - 99 mg/dL  Glucose, capillary     Status: Abnormal   Collection Time: 03/25/14  5:07 PM  Result Value Ref Range   Glucose-Capillary 160 (H) 70 - 99 mg/dL   Comment 1 Notify RN   Glucose, capillary     Status: Abnormal   Collection Time: 03/25/14  9:57 PM  Result Value Ref Range   Glucose-Capillary 208 (H) 70 -  99 mg/dL  Basic metabolic panel     Status: Abnormal   Collection Time: 03/26/14  5:05 AM  Result Value Ref Range   Sodium 139 137 - 147 mEq/L   Potassium 3.2 (L) 3.7 - 5.3 mEq/L   Chloride 95 (L) 96 - 112 mEq/L   CO2 31 19 - 32 mEq/L   Glucose, Bld 195 (H) 70 - 99 mg/dL   BUN 25 (H) 6 - 23 mg/dL   Creatinine, Ser 13/11/15 (H) 0.50 - 1.10 mg/dL   Calcium 9.6 8.4 - 4.40 mg/dL   GFR calc non Af Amer 46 (L) >90 mL/min   GFR calc Af Amer 53 (L) >90 mL/min   Anion gap 13 5 - 15  Glucose, capillary     Status: Abnormal   Collection Time: 03/26/14  5:54 AM  Result Value Ref Range   Glucose-Capillary 173 (H) 70 - 99 mg/dL    Micro Results: No results found for this or any previous visit (from the past 240 hour(s)). Studies/Results: X-ray Chest Pa And Lateral  03/24/2014   CLINICAL DATA:  Shortness of breath.  Congestive heart failure.  EXAM: CHEST  2 VIEW  COMPARISON:  PA and lateral chest 08/20/2013 in 09/14/2012. CT chest 08/10/2012.  FINDINGS: AICD remains in place. There is cardiomegaly and likely mild interstitial edema. No definite consolidative process is identified although the study  is limited by the patient's size. No pneumothorax or pleural effusion.  IMPRESSION: Cardiomegaly and likely mild interstitial edema.   Electronically Signed   By: Drusilla Kanner M.D.   On: 03/24/2014 22:18   Medications: I have reviewed the patient's current medications. Scheduled Meds: . ALPRAZolam  0.5 mg Oral QHS  . amitriptyline  150 mg Oral QHS  . atorvastatin  40 mg Oral Daily  . carvedilol  12.5 mg Oral BID WC  . febuxostat  40 mg Oral Daily  . furosemide  80 mg Intravenous 3 times per day  . gabapentin  100 mg Oral TID  . insulin aspart  0-20 Units Subcutaneous TID WC  . insulin detemir  40 Units Subcutaneous Q2200  . magnesium oxide  400 mg Oral BID  . methotrexate  15 mg Oral Weekly  . metolazone  2.5 mg Oral Daily  . pantoprazole  80 mg Oral Daily  . polyethylene glycol  17 g Oral Daily   . predniSONE  40 mg Oral Q breakfast  . rivaroxaban  20 mg Oral Q supper  . sodium chloride  3 mL Intravenous Q12H   Continuous Infusions:  PRN Meds:.albuterol, HYDROcodone-acetaminophen Assessment/Plan: Active Problems:   OSA (obstructive sleep apnea)   Hypertension   Depression   Nonischemic cardiomyopathy   CKD (chronic kidney disease) stage 3, GFR 30-59 ml/min   Controlled type 2 diabetes mellitus with diabetic nephropathy   CHF exacerbation  CHF exacerbation- Most likey due to medication non compliance and dietary indescretion. Last ECHO, poor image quality but estimated EF- 06/2013- 50l-55%, dilated and hypokinetic RV. Per chart the pts weight has been steadily increasing over the past 5 months at least, but recorded weight- 11/2013- 360s. Pt weight today- 383Lbs. SOB likely related to abdominal distension and therefore respiratory compromise, as no crackles on exam.Chest x-ray was poor quality due to body habitus. Pro-BNP at 228.5, EKG and troponins normal. - Cards Following, recs apprecaited - Metolazone added, adequate response, continue 1 daily per cards - Continue lasix per Cards - Replete K+ - Repeat CXR - Restart Carvedilol 12.5mg  BID, hold rest of BP meds - Cont statin- lipitor- 40 daily - Daily weights - in and out- strict  Knee Pain: Patient has a history of gout, suspected to be having current knee pain secondary to gout. Maintained on Uloric, adding prednisone per Cardiology. - Prednisone 40mg  daily  HTN- Bp 96/78 on examination in clinic. Home meds- Coreg- 12.5, hydralazine- 50mg  TID, Imdur- 60mg  daily, metolazone 5mg  once weekly, spironolactone- 25mg  daily, torsemide- 100mg  BID. -As above  CKD3 - Creatinine bumped to 1.31 from 1.04, expected given diuresis.  - Avoid nephrotoxics - Bmet daily while diuresing, replace K as indicated. - Mag level - Cont home mag oxide  Diabetes- Home meds- Levemir- 50u daily. Last hgba1c- 03/12/2014- 6.5. - Levemir- increase  to 40 from 35   - SSI-s   Depression with anxiety- Cont home xanax- 0.5mg  QHS, amitryptilline- 150mg  daily.  OSA- CPAP.  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  Code- Full.  Dispo: Disposition is deferred at this time, awaiting improvement of current medical problems. Anticipated discharge in approximately 1-2 day(s).   The patient does have a current PCP Harold Barban, MD) and does need an [facility] hospital follow-up appointment after discharge.  The patient does have transportation limitations that hinder transportation to clinic appointments.  This is a Psychologist, occupational Note.  The care of the  patient was discussed with Dr. Mariea Clonts and the assessment and plan formulated with their assistance.  Please see their attached note for official documentation of the daily encounter.   LOS: 2 days   Tamera Punt, Med Student 03/26/2014, 11:24 AM

## 2014-03-26 NOTE — Evaluation (Signed)
Occupational Therapy Evaluation Patient Details Name: [name] MRN: [id number] DOB: 05/11/1961 Today's Date: 03/26/2014    History of Present Illness Ms. Heinle was admitted from the HF clinic yesterday by Internal Medicine for volume overload   Clinical Impression   Pt s/p above. Education provided during session. Feel pt will benefit from acute OT to increase independence and safety prior to d/c.     Follow Up Recommendations  Supervision/Assistance - 24 hour;No OT follow up    Equipment Recommendations  None recommended by OT    Recommendations for Other Services       Precautions / Restrictions Precautions Precautions: Fall      Mobility Bed Mobility Overal bed mobility: Needs Assistance Bed Mobility: Sit to Supine       Sit to supine: Supervision      Transfers Overall transfer level: Needs assistance Equipment used: Rolling walker (2 wheeled) Transfers: Sit to/from Stand Sit to Stand: Min guard         General transfer comment: Min guard for safety. Cues for technique.    Balance                                            ADL Overall ADL's : Needs assistance/impaired     Grooming: Wash/dry hands;Set up;Sitting               Lower Body Dressing: Min guard;Sit to/from stand   Toilet Transfer: Min guard;Ambulation;RW;Regular Toilet;Grab bars   Toileting- Clothing Manipulation and Hygiene: Min guard;Sit to/from stand       Functional mobility during ADLs: Min guard;Rolling walker General ADL Comments: Educated on energy conservation techniques and deep breathing technique. Pt performed deep breathing as she was out of breath in session. Educated on safety (safe shoewear, use basket on her rollator to allow hands to be on walker and not carrying items, sitting for LB ADLs). Recommended family pick up clutter/cords/rugs. Discussed having non skid in bathroom for safety. Recommended having someone with pt for tub  transfer and bathing. Pt up in room ambulating without walker when OT arrived. OT told pt not to get up without nurse.  Educated on long handled sponge/toilet aide as pt states washing bottom is difficult on her shower chair. She then proceeds to explain performing hygiene is not an issue and performed fine during session. Pt able to verbalize technique for tub transfer.      Vision                     Perception     Praxis      Pertinent Vitals/Pain Pain Assessment: 0-10 Pain Score: 7  Pain Location: left knee Pain Descriptors / Indicators: Burning     Hand Dominance Right   Extremity/Trunk Assessment Upper Extremity Assessment Upper Extremity Assessment: Overall WFL for tasks assessed   Lower Extremity Assessment Lower Extremity Assessment: Defer to PT evaluation       Communication Communication Communication: No difficulties   Cognition Arousal/Alertness: Awake/alert Behavior During Therapy: WFL for tasks assessed/performed Overall Cognitive Status:  (unsure of baseline) Area of Impairment: Safety/judgement         Safety/Judgement: Decreased awareness of safety         General Comments       Exercises       Shoulder Instructions      Home Living Family/patient expects  to be discharged to:: Private residence Living Arrangements: Children Available Help at Discharge: Family Type of Home: House Home Access: Stairs to enter Secretary/administrator of Steps: 3 Entrance Stairs-Rails: Can reach both Home Layout: One level     Bathroom Shower/Tub: Chief Strategy Officer: Standard     Home Equipment: Environmental consultant - 4 wheels;Bedside commode;Shower seat   Additional Comments: daughter works third shift      Prior Functioning/Environment Level of Independence: Needs assistance    ADL's / Homemaking Assistance Needed: occasional help with meals (grandaughter) or shower        OT Diagnosis: Generalized weakness;Acute pain;Other  (comment) (cardiopulmonary status limiting activity)   OT Problem List: Decreased strength;Decreased activity tolerance;Impaired balance (sitting and/or standing);Decreased knowledge of precautions;Pain;Obesity;Cardiopulmonary status limiting activity;Decreased knowledge of use of DME or AE;Decreased safety awareness   OT Treatment/Interventions: Self-care/ADL training;DME and/or AE instruction;Therapeutic activities;Balance training;Patient/family education    OT Goals(Current goals can be found in the care plan section) Acute Rehab OT Goals Patient Stated Goal: not stated OT Goal Formulation: With patient Time For Goal Achievement: 04/02/14 Potential to Achieve Goals: Good ADL Goals Pt Will Perform Lower Body Bathing: with modified independence;sit to/from stand (including gathering supplies) Pt Will Perform Lower Body Dressing: with modified independence;sit to/from stand (including gathering supplies.) Pt Will Transfer to Toilet: with modified independence;ambulating Additional ADL Goal #1: Pt will verbalize and demonstrate 3/3 energy conservation techniques with ADL/functional activity.  OT Frequency: Min 2X/week   Barriers to D/C:            Co-evaluation              End of Session Equipment Utilized During Treatment: Gait belt;Rolling walker  Activity Tolerance: Patient limited by fatigue Patient left: in bed;with bed alarm set;with call bell/phone within reach   Time: 6301-6010 OT Time Calculation (min): 35 min Charges:  OT General Charges $OT Visit: 1 Procedure OT Evaluation $Initial OT Evaluation Tier I: 1 Procedure OT Treatments $Self Care/Home Management : 8-22 mins G-CodesEarlie Raveling OTR/L [phone] 03/26/2014, 4:18 PM

## 2014-03-26 NOTE — Progress Notes (Signed)
Subjective: Pt sitting up on the side of the bed. No events overnight. Fasting blood sugars- 173 this am.    Objective: Vital signs in last 24 hours: Filed Vitals:   03/25/14 1500 03/25/14 2154 03/25/14 2214 03/26/14 0620  BP: 116/60 124/65  128/77  Pulse: 96 98 98 92  Temp: 97.7 F (36.5 C) 98.3 F (36.8 C)  97.9 F (36.6 C)  TempSrc: Oral Oral  Oral  Resp: 16 20 18 20   Height:      Weight:    378 lb 14.4 oz (171.868 kg)  SpO2: 100% 98% 98% 99%   Weight change: -4 lb 7.7 oz (-2.032 kg)  Intake/Output Summary (Last 24 hours) at 03/26/14 1058 Last data filed at 03/26/14 0600  Gross per 24 hour  Intake   1083 ml  Output   2625 ml  Net  -1542 ml   Physical exam- GENERAL- NAD HEENT- Atraumatic, normocephalic, neck supple. CARDIAC- RRR, no added sounds. RESP- Moving equal volumes of air, and clear to auscultation bilaterally, no wheezes or crackles,  ABDOMEN- Soft, nontender, obese, bowel sounds present. NEURO- No obvious Cr N abnormality, normal strenght. EXTREMITIES- Bandage wrappings on leg PSYCH- Normal mood and affect, appropriate thought content and speech.  Lab Results: Basic Metabolic Panel:  Recent Labs Lab 03/24/14 1917 03/25/14 0730 03/26/14 0505  NA 141 137 139  K 3.2* 5.0 3.2*  CL 95* 103 95*  CO2 32 21 31  GLUCOSE 100* 218* 195*  BUN 20 20 25*  CREATININE 1.28* 1.04 1.31*  CALCIUM 9.5 10.3 9.6  MG 1.7  --   --    Liver Function Tests:  Recent Labs Lab 03/24/14 1917  AST 31  ALT 35  ALKPHOS 153*  BILITOT 0.4  PROT 8.0  ALBUMIN 3.3*   CBC:  Recent Labs Lab 03/24/14 1917  WBC 5.3  HGB 11.1*  HCT 36.1  MCV 84.9  PLT 249   Cardiac Enzymes:  Recent Labs Lab 03/24/14 1917 03/25/14 0046 03/25/14 0730  TROPONINI <0.30 <0.30 <0.30   BNP:  Recent Labs Lab 03/24/14 1917  PROBNP 228.5*   CBG:  Recent Labs Lab 03/24/14 2205 03/25/14 0635 03/25/14 1212 03/25/14 1707 03/25/14 2157 03/26/14 0554  GLUCAP 184* 170*  173* 160* 208* 173*   Studies/Results: X-ray Chest Pa And Lateral 03/24/2014   CLINICAL DATA:  Shortness of breath.  IMPRESSION: Cardiomegaly and likely mild interstitial edema.   Electronically Signed   By: 13/01/2014 M.D.   On: 03/24/2014 22:18   Medications: I have reviewed the patient's current medications. Scheduled Meds: . ALPRAZolam  0.5 mg Oral QHS  . amitriptyline  150 mg Oral QHS  . atorvastatin  40 mg Oral Daily  . carvedilol  12.5 mg Oral BID WC  . febuxostat  40 mg Oral Daily  . furosemide  80 mg Intravenous 3 times per day  . gabapentin  100 mg Oral TID  . insulin aspart  0-20 Units Subcutaneous TID WC  . insulin detemir  35 Units Subcutaneous Q2200  . magnesium oxide  400 mg Oral BID  . methotrexate  15 mg Oral Weekly  . metolazone  2.5 mg Oral Daily  . pantoprazole  80 mg Oral Daily  . polyethylene glycol  17 g Oral Daily  . predniSONE  40 mg Oral Q breakfast  . rivaroxaban  20 mg Oral Q supper  . sodium chloride  3 mL Intravenous Q12H   Continuous Infusions:  PRN Meds:.albuterol, HYDROcodone-acetaminophen Assessment/Plan: Active  Problems:   CHF exacerbation   OSA (obstructive sleep apnea)   Hypertension   Depression   Nonischemic cardiomyopathy   CKD (chronic kidney disease) stage 3, GFR 30-59 ml/min   Controlled type 2 diabetes mellitus with diabetic nephropathy  CHF exacerbation- Likely exercerbated by dietary indiscretion, appears she is mostly complaint. Last ECHO, poor image quality but estimated EF- 06/2013- 50l-55%, dilated and hypokinetic RV. Per chart- pts weight has been steadily increasing over the past 5 months at least, some of which might be due to fat as she does admit to eating anything she wants. Recorded weight- 11/2013- 260s. Pt weight on admission- 383Lbs. SOB likely related to abdominal distension and therefore respiratory compromise, as no crackles on exam. Chest xray- poor inspiratory effort. Pro-bnp- unremarkable. - Daily BMP -  Cards Following, recs appreciated. Cont Lasix- 80mg  TID, start Metolazone- 2.5mg  BID. - Good urine out-put- Net 2.3L. - Will repeat chest xray. - trop x3- neg - Not on ASA- Hx of GI bleed- 11/2013. - Cont statin- lipitor- 40 daily - Daily weights- 378 today. 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 Differentials include- of osteoarthritis, related to obesity. No joint aspirations on file. Consideration for Gout by cards, placed on prednsione. - Follow up as outpt, will need orthopedics refferal.  HTN- Bp 96/78 on admission. Improving, Home meds- Coreg- 12.5, hydralazine- 50mg  TID, Imdur- 60mg  daily, metolazone 5mg  once weekly, spironolactone- 25mg  daily, torsemide- 100mg  BID. - On home coreg- 12.5mg  BID  CKD3 - Stable Cr- 1.28, appears to be at baseline. GFR- 55. Cr- 1.31 today, slight bump from 1.04 yesterday, but about pts baseline. - Avoid nephrotoxics - Bmet daily while diuresing, replace K as indicated. - Cont home mag oxide  Diabetes- Home meds- Levemir- 50u daily. Last hgba1c- 03/12/2014- 6.5. - Increase Levemir- 40u daily,  - SSI-R   Depression with anxiety- Cont home xanax- 0.5mg  QHS, amitryptilline- 150mg  daily.  OSA- CPAP.  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  Code- Full.   Dispo: Disposition is deferred at this time, awaiting improvement of current medical problems.  Anticipated discharge in approximately 1-2 day(s).   The patient does have a current PCP , MD) and does need an [facility] hospital follow-up appointment after discharge.  The patient does have transportation limitations that hinder transportation to clinic appointments.  .Services Needed at time of discharge: Y = Yes, Blank = No PT:   OT:   RN:   Equipment:   Other:     LOS: 2 days   , MD 03/26/2014, 10:58 AM

## 2014-03-26 NOTE — Plan of Care (Signed)
Problem: Phase II Progression Outcomes Goal: Pain controlled Outcome: Adequate for Discharge Goal: Dyspnea controlled with activity Outcome: Not Progressing Goal: Walk in hall or up in chair TID Outcome: Adequate for Discharge Goal: Discharge plan established Outcome: Progressing Goal: Tolerating diet Outcome: Adequate for Discharge Goal: Fluid volume status improved Outcome: Progressing Goal: Case manager referral Outcome: Progressing

## 2014-03-26 NOTE — Discharge Summary (Signed)
Name: [name] MRN: [id number] DOB: [birth year] 53 y.o. PCP: Harold Barban, MD  Date of Admission: 03/24/2014  5:14 PM Date of Discharge: 03/26/2014 Attending Physician: Levert Feinstein, MD  Discharge Diagnosis:  1. CHF Exacerbation  2. Diabetes Mellitus  3. Chronic Kidney Disease  4. Hypertension   Active Problems:   OSA (obstructive sleep apnea)   Hypertension   Depression   Nonischemic cardiomyopathy   CKD (chronic kidney disease) stage 3, GFR 30-59 ml/min   Controlled type 2 diabetes mellitus with diabetic nephropathy   CHF exacerbation  Discharge Medications:   Medication List    STOP taking these medications        glucose blood test strip  Commonly known as:  FREESTYLE LITE     hydrALAZINE 50 MG tablet  Commonly known as:  APRESOLINE     isosorbide mononitrate 60 MG 24 hr tablet  Commonly known as:  IMDUR      TAKE these medications        ALPRAZolam 0.5 MG tablet  Commonly known as:  XANAX  Take 1 tablet (0.5 mg total) by mouth at bedtime.     amitriptyline 150 MG tablet  Commonly known as:  ELAVIL  Take 1 tablet (150 mg total) by mouth at bedtime.     atorvastatin 40 MG tablet  Commonly known as:  LIPITOR  Take 1 tablet (40 mg total) by mouth daily.     BD PEN NEEDLE NANO U/F 32G X 4 MM Misc  Generic drug:  Insulin Pen Needle     carvedilol 12.5 MG tablet  Commonly known as:  COREG  TAKE ONE & ONE-HALF TABLETS BY MOUTH TWICE DAILY     diclofenac sodium 1 % Gel  Commonly known as:  VOLTAREN  Apply 2 g topically 4 (four) times daily.     febuxostat 40 MG tablet  Commonly known as:  ULORIC  Take 40 mg by mouth daily.     ferrous sulfate 325 (65 FE) MG tablet  Take 1 tablet (325 mg total) by mouth 3 (three) times daily with meals.     gabapentin 100 MG capsule  Commonly known as:  NEURONTIN  Take 1 capsule (100 mg total) by mouth 3 (three) times daily.     HYDROcodone-acetaminophen 10-325 MG per tablet  Commonly  known as:  NORCO  Take 1 tablet by mouth every 6 (six) hours as needed for severe pain.     Insulin Detemir 100 UNIT/ML Pen  Commonly known as:  LEVEMIR  Inject 50 Units into the skin daily at 10 pm.     Magnesium Oxide 400 MG Caps  Take 1 capsule (400 mg total) by mouth 2 (two) times daily.     methotrexate 2.5 MG tablet  Commonly known as:  RHEUMATREX  Take 15 mg by mouth once a week. Takes weekly on Monday     metolazone 2.5 MG tablet  Commonly known as:  ZAROXOLYN  Take 5 mg by mouth once a week. Every Tuesday     nystatin 100000 UNIT/GM Powd  Apply 1 Bottle topically 2 (two) times daily.     omeprazole 40 MG capsule  Commonly known as:  PRILOSEC  Take 1 capsule (40 mg total) by mouth daily.     polyethylene glycol packet  Commonly known as:  MIRALAX / GLYCOLAX  Take 17 g by mouth daily as needed for moderate constipation.     potassium chloride SA 20 MEQ tablet  Commonly known as:  K-DUR,KLOR-CON  Take 2 tablets (40 mEq total) by mouth 2 (two) times daily. Take on extra 20 meq on Tuesday with Metolazone     PROAIR HFA 108 (90 BASE) MCG/ACT inhaler  Generic drug:  albuterol  INHALE TWO PUFFS BY MOUTH EVERY 6 HOURS AS NEEDED FOR  WHEEZING  OR  SHORTNESS  OF  BREATH     rivaroxaban 20 MG Tabs tablet  Commonly known as:  XARELTO  Take 1 tablet (20 mg total) by mouth daily with supper.     spironolactone 25 MG tablet  Commonly known as:  ALDACTONE  TAKE ONE TABLET BY MOUTH ONCE DAILY     torsemide 100 MG tablet  Commonly known as:  DEMADEX  Take 1 tablet (100 mg total) by mouth 2 (two) times daily.        Disposition and follow-up:   [name] was discharged from [facility] in Stable condition.  At the hospital follow up visit please address:  1.  Please assess volume status, adherence to medications, adherence to a healthier diet  2.  Labs / imaging needed at time of follow-up: BMET- Cr, Bicarb, K.  3.  Pending labs/ test  needing follow-up: None  Follow-up Appointments:   Discharge Instructions:   Consultations: Treatment Team:  Rounding Lbcardiology, MD  Procedures Performed:  X-ray Chest Pa And Lateral  03/24/2014   CLINICAL DATA:  Shortness of breath.  Congestive heart failure.  EXAM: CHEST  2 VIEW  COMPARISON:  PA and lateral chest 08/20/2013 in 09/14/2012. CT chest 08/10/2012.  FINDINGS: AICD remains in place. There is cardiomegaly and likely mild interstitial edema. No definite consolidative process is identified although the study is limited by the patient's size. No pneumothorax or pleural effusion.  IMPRESSION: Cardiomegaly and likely mild interstitial edema.   Electronically Signed   By: Drusilla Kanner M.D.   On: 03/24/2014 22:18    Admission HPI: 87 Y O F with PMH of CHF- Last EF- 05/21/2013- 50-55%. DM 2, HTN, RA, NICM, OSA, CKD 3, MR status post repair 2010 with PFO closure, with AICD, and anxiety. Pt presented with SOB present for the past- 3 days duration present with mild exertion, she sleeps propped on 3 pillows, but denies PND. Pt says she has also been having chest tightness, abdominal distention and tightness. She also has had a dry Cough over the past week, but denies fevers, chills, chest pain. Pt endorses been mostly complaint with her meds, except for 2 days - Friday and Saturday. She has been eating basically any meal without consideration for salt content, including pickles. Pt says though that she sometimes does not the Fluid pills because of increased frequency of urination and sometimes incontinence, but denies dysuria. Pt says her weight about 2 months ago- 240s, she has not been checking her weight consistently, but says it has been 380s.  She presented For follow up at the heart failure clinic today, and was seen by Dr. Gala Romney who was concerned about her her significant weight gain, she was therefore admitted for IV diuresis.    Hospital Course by problem list:   1. CHF  Exacerbation: Pt was admitted from the heart failure clinic to our service. Pt presented with weight gain- 10-15Lbs, with SOB with exertion. Physical Exam- limited by pts body habitus, Pro-bnp- 228.5. BMP, lower than her other recent BMPs in July and August (553 and 317, respectively) with stable kidney function- Cr- 1.28. Chest xray- with  mild congestive edema, same on repeat 2 days later. She was started on IV lasix diuresis 80mg  TID per Cards recommendations. BP medications- Coreg- 12.5, hydralazine- 50mg  TID, Imdur- 60mg  daily, metolazone 5mg  once weekly, spironolactone- 25mg  daily, torsemide- 100mg  BID, were all held initially as pt blood pressure on admission- 96/78, and concern for further drop with aggressive diuresis. Bp meds- Coreg was introduced, but with persistent soft blood pressures on admission, Spironolactone was restarted on discharge, we differed adding Bp meds- Imdur and hydralazine till outpt follow up. Metolazone was added on day 2 of admission- 2.5mg  BID with a total Net output of 5.9L. Pt K was suplimented  With diuresis. Home meds- Torsemide- 100mg  BID, and Metolazone- 5mg  once a week with 40 Po KCL BID on the day of she takes metolazone, were continued on discharge.  2. Knee Pain: Patient has a history of left knee pain, she reports that she had a history of what sounds like osteoarthritis in the knee however upon meeting with the Cardiology team they were concerned it was a gout flair. She was started on prednisone 40mg  for a 3 day course with improvement by time of discharge. Uric acid- 5.9- 07/23/2013. Pt home meds include Uloric-40mg  daily  which she says he was complaint with. 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 Pt benefit from intraarticular steroid injections, or Out patient follow up with orthopedics.    3. Hypertension: Upon admission her home blood pressure medications were held, including  her Coreg, Imdur, hydralazine, and spironolactone. Her diuretic medications were held initially as well in light of IV lasix therapy, although the metolazone was added back on after inadequate output.  Her coreg was added on admission. Pt was discharged on prior home regimen- Spironolactone- 25mg  daily and coreg- 12.5mg  daily. Imdur and hydralazine were held with soft blood pressure, to be added on follow up visit.  4. Chronic Kidney Disease: Patient's admission creatinine was 1.04, which increased to 1.62 on discharge with aggressive diuresis, also with some contraction alkalosis, Bicarb- 34. Follow up Bmet- Cr and Bicarb on discharge.    5. Type 2 Diabetes Mellitus: HgbA1c- 6.5- 03/12/2014. She was maintained on Levemir with sliding scale aspart during her admission. Her blood glucose remained stable in the 140's to 200's range and insulin was titrated appropriately given the addition of prednisone. Discharged home on prior home regimen- Levemir- 50u daily.    6. Depression with anxiety: She was maintained on her home regimen of Xanax and Amitriptyline.   Discharge Vitals:   BP 128/77 mmHg  Pulse 92  Temp(Src) 97.9 F (36.6 C) (Oral)  Resp 20  Ht 5\' 6"  (1.676 m)  Wt 171.868 kg (378 lb 14.4 oz)  BMI 61.19 kg/m2  SpO2 99%  LMP 05/05/2012  Discharge Labs:  Results for orders placed or performed during the hospital encounter of 03/24/14 (from the past 24 hour(s))  Glucose, capillary     Status: Abnormal   Collection Time: 03/25/14  5:07 PM  Result Value Ref Range   Glucose-Capillary 160 (H) 70 - 99 mg/dL   Comment 1 Notify RN   Glucose, capillary     Status: Abnormal   Collection Time: 03/25/14  9:57 PM  Result Value Ref Range   Glucose-Capillary 208 (H) 70 - 99 mg/dL  Basic metabolic panel     Status: Abnormal   Collection Time: 03/26/14  5:05 AM  Result Value Ref Range   Sodium 139  137 - 147 mEq/L   Potassium 3.2 (L) 3.7 - 5.3 mEq/L   Chloride 95 (L) 96 - 112 mEq/L   CO2 31 19 -  32 mEq/L   Glucose, Bld 195 (H) 70 - 99 mg/dL   BUN 25 (H) 6 - 23 mg/dL   Creatinine, Ser 3.38 (H) 0.50 - 1.10 mg/dL   Calcium 9.6 8.4 - 32.9 mg/dL   GFR calc non Af Amer 46 (L) >90 mL/min   GFR calc Af Amer 53 (L) >90 mL/min   Anion gap 13 5 - 15  Glucose, capillary     Status: Abnormal   Collection Time: 03/26/14  5:54 AM  Result Value Ref Range   Glucose-Capillary 173 (H) 70 - 99 mg/dL    Signed: Onnie Boer, MD 03/26/2014, 1:28 PM    Services Ordered on Discharge: None Equipment Ordered on Discharge: None

## 2014-03-26 NOTE — Evaluation (Signed)
Physical Therapy Evaluation Patient Details Name: [name] MRN: [id number] DOB: 1960/08/07 Today's Date: 03/26/2014   History of Present Illness  [name] was admitted from the HF clinic yesterday by Internal Medicine for volume overload  Clinical Impression  Patient demonstrates deficits in functional mobility as indicated below. Will need continued skilled PT to address deficits and maximize function. Will see as indicated and progress as tolerated.  Patient would like to try use of a cane secondary to inability to navigate through daughters house with RW. Will trial next session, recommend HHPT for mobility and in home safety evaluation as patient does endorse history of falls.    Follow Up Recommendations Home health PT;Supervision for mobility/OOB    Equipment Recommendations   (TBD)    Recommendations for Other Services       Precautions / Restrictions Precautions Precautions: Fall      Mobility  Bed Mobility Overal bed mobility: Needs Assistance Bed Mobility: Supine to Sit     Supine to sit: Min assist        Transfers Overall transfer level: Needs assistance Equipment used: Rolling walker (2 wheeled) Transfers: Sit to/from Stand Sit to Stand: Min guard;Min assist         General transfer comment: Patient uses rocker method with momentum and altered hand placement to come to standing secondary to her increased body habitus. Educated on elevated surfaces and hand placement for safety, increased assist to help control descent into chair.  Ambulation/Gait Ambulation/Gait assistance: Min guard Ambulation Distance (Feet): 16 Feet Assistive device: Rolling walker (2 wheeled) Gait Pattern/deviations: Step-to pattern;Decreased stride length;Drifts right/left;Trunk flexed;Wide base of support Gait velocity: decreased Gait velocity interpretation: <1.8 ft/sec, indicative of risk for recurrent falls General Gait Details: patient with increased lateral  sway during mobility secondary to increased body habitus and poor LE approximation. Patient also with limiting edema affecting mobility. Educated patient on elevation for edema control.  Stairs            Wheelchair Mobility    Modified Rankin (Stroke Patients Only)       Balance Overall balance assessment: History of Falls                                           Pertinent Vitals/Pain      Home Living Family/patient expects to be discharged to:: Private residence Living Arrangements: Children Available Help at Discharge: Family Type of Home: House Home Access: Stairs to enter Entrance Stairs-Rails: Can reach both Entrance Stairs-Number of Steps: 3 Home Layout: One level Home Equipment: Walker - 4 wheels;Bedside commode;Shower seat Additional Comments: daughter works third shift    Prior Function Level of Independence: Needs assistance      ADL's / Homemaking Assistance Needed: occasional help with meals (grandaughter) or shower  Comments: daughter provides assistance with some daily tasks     Hand Dominance   Dominant Hand: Right    Extremity/Trunk Assessment               Lower Extremity Assessment: Generalized weakness;RLE deficits/detail;LLE deficits/detail RLE Deficits / Details: limited ROM and increased Edema limiting evaluation, strength grossly function, + arthritic changes BLEs LLE Deficits / Details: limited ROM and increased Edema limiting evaluation, strength grossly function, + arthritic changes BLEs     Communication   Communication: No difficulties  Cognition Arousal/Alertness: Awake/alert Behavior During Therapy: WFL for tasks assessed/performed Overall  Cognitive Status: Within Functional Limits for tasks assessed                      General Comments General comments (skin integrity, edema, etc.): Educated on ROM and LE activity and positioning    Exercises        Assessment/Plan    PT  Assessment Patient needs continued PT services  PT Diagnosis Difficulty walking;Abnormality of gait;Generalized weakness   PT Problem List Decreased strength;Decreased range of motion;Decreased activity tolerance;Decreased balance;Decreased mobility;Decreased coordination;Decreased safety awareness;Cardiopulmonary status limiting activity  PT Treatment Interventions DME instruction;Gait training;Stair training;Therapeutic exercise;Therapeutic activities;Functional mobility training;Balance training;Patient/family education   PT Goals (Current goals can be found in the Care Plan section) Acute Rehab PT Goals Patient Stated Goal: to go home PT Goal Formulation: With patient Time For Goal Achievement: 04/09/14 Potential to Achieve Goals: Good    Frequency Min 3X/week   Barriers to discharge Inaccessible home environment will need HHPT safety evaluation    Co-evaluation               End of Session   Activity Tolerance: Patient limited by fatigue Patient left: in chair;with call bell/phone within reach Nurse Communication: Mobility status         Time: 1022-1039 PT Time Calculation (min) (ACUTE ONLY): 17 min   Charges:   PT Evaluation $Initial PT Evaluation Tier I: 1 Procedure PT Treatments $Therapeutic Activity: 8-22 mins   PT G CodesFabio Asa 03/26/2014, 11:54 AM Charlotte Crumb, PT DPT  [phone]

## 2014-03-27 LAB — BASIC METABOLIC PANEL
Anion gap: 13 (ref 5–15)
Anion gap: 13 (ref 5–15)
BUN: 32 mg/dL — AB (ref 6–23)
BUN: 35 mg/dL — AB (ref 6–23)
CALCIUM: 9.4 mg/dL (ref 8.4–10.5)
CO2: 33 mEq/L — ABNORMAL HIGH (ref 19–32)
CO2: 32 mEq/L (ref 19–32)
CREATININE: 1.52 mg/dL — AB (ref 0.50–1.10)
CREATININE: 1.55 mg/dL — AB (ref 0.50–1.10)
Calcium: 9.9 mg/dL (ref 8.4–10.5)
Chloride: 91 mEq/L — ABNORMAL LOW (ref 96–112)
Chloride: 92 mEq/L — ABNORMAL LOW (ref 96–112)
GFR calc Af Amer: 44 mL/min — ABNORMAL LOW (ref >90)
GFR calc Af Amer: 43 mL/min — ABNORMAL LOW (ref >90)
GFR calc non Af Amer: 38 mL/min — ABNORMAL LOW (ref >90)
GFR calc non Af Amer: 37 mL/min — ABNORMAL LOW (ref >90)
GLUCOSE: 222 mg/dL — AB (ref 70–99)
Glucose, Bld: 219 mg/dL — ABNORMAL HIGH (ref 70–99)
Potassium: 2.8 mEq/L — CL (ref 3.7–5.3)
Potassium: 4.1 mEq/L (ref 3.7–5.3)
Sodium: 137 mEq/L (ref 137–147)
Sodium: 137 mEq/L (ref 137–147)

## 2014-03-27 LAB — GLUCOSE, CAPILLARY
GLUCOSE-CAPILLARY: 143 mg/dL — AB (ref 70–99)
Glucose-Capillary: 171 mg/dL — ABNORMAL HIGH (ref 70–99)
Glucose-Capillary: 219 mg/dL — ABNORMAL HIGH (ref 70–99)
Glucose-Capillary: 224 mg/dL — ABNORMAL HIGH (ref 70–99)

## 2014-03-27 MED ORDER — POTASSIUM CHLORIDE CRYS ER 20 MEQ PO TBCR
40.0000 meq | EXTENDED_RELEASE_TABLET | Freq: Two times a day (BID) | ORAL | Status: AC
  Administered 2014-03-27 (×2): 40 meq via ORAL
  Filled 2014-03-27 (×2): qty 2

## 2014-03-27 MED ORDER — POTASSIUM CHLORIDE CRYS ER 20 MEQ PO TBCR
40.0000 meq | EXTENDED_RELEASE_TABLET | Freq: Once | ORAL | Status: AC
  Administered 2014-03-27: 40 meq via ORAL

## 2014-03-27 MED ORDER — POTASSIUM CHLORIDE CRYS ER 20 MEQ PO TBCR
40.0000 meq | EXTENDED_RELEASE_TABLET | Freq: Once | ORAL | Status: AC
  Administered 2014-03-27: 40 meq via ORAL
  Filled 2014-03-27: qty 2

## 2014-03-27 NOTE — Progress Notes (Signed)
Patient ID: [name], female   DOB: Jan 22, 1961, 53 y.o.   MRN: [id number]   SUBJECTIVE: Breathing easier.  Reasonable diuresis though weight has not changed.  Still complaining of leg pain.   Scheduled Meds: . ALPRAZolam  0.5 mg Oral QHS  . amitriptyline  150 mg Oral QHS  . atorvastatin  40 mg Oral Daily  . carvedilol  12.5 mg Oral BID WC  . febuxostat  40 mg Oral Daily  . furosemide  80 mg Intravenous 3 times per day  . gabapentin  100 mg Oral TID  . insulin aspart  0-20 Units Subcutaneous TID WC  . insulin detemir  40 Units Subcutaneous Q2200  . magnesium oxide  400 mg Oral BID  . methotrexate  15 mg Oral Weekly  . metolazone  2.5 mg Oral Daily  . pantoprazole  80 mg Oral Daily  . polyethylene glycol  17 g Oral Daily  . potassium chloride  40 mEq Oral BID  . potassium chloride  40 mEq Oral Once  . potassium chloride  40 mEq Oral Once  . rivaroxaban  20 mg Oral Q supper  . sodium chloride  3 mL Intravenous Q12H   Continuous Infusions:  PRN Meds:.albuterol, HYDROcodone-acetaminophen    Filed Vitals:   03/26/14 2018 03/27/14 0143 03/27/14 0429 03/27/14 0435  BP: 108/80  115/74   Pulse: 95 98 93   Temp: 97.6 F (36.4 C)  97.9 F (36.6 C)   TempSrc: Oral  Oral   Resp: 18 18 20    Height:      Weight:    378 lb 12.8 oz (171.823 kg)  SpO2: 100% 100% 98%     Intake/Output Summary (Last 24 hours) at 03/27/14 0810 Last data filed at 03/27/14 0220  Gross per 24 hour  Intake   1200 ml  Output   2850 ml  Net  -1650 ml    LABS: Basic Metabolic Panel:  Recent Labs  13/12/15 1917  03/26/14 0505 03/27/14 0405  NA 141  < > 139 137  K 3.2*  < > 3.2* 2.8*  CL 95*  < > 95* 91*  CO2 32  < > 31 33*  GLUCOSE 100*  < > 195* 219*  BUN 20  < > 25* 32*  CREATININE 1.28*  < > 1.31* 1.52*  CALCIUM 9.5  < > 9.6 9.4  MG 1.7  --   --   --   < > = values in this interval not displayed. Liver Function Tests:  Recent Labs  03/24/14 1917  AST 31  ALT 35  ALKPHOS 153*   BILITOT 0.4  PROT 8.0  ALBUMIN 3.3*   No results for input(s): LIPASE, AMYLASE in the last 72 hours. CBC:  Recent Labs  03/24/14 1917  WBC 5.3  HGB 11.1*  HCT 36.1  MCV 84.9  PLT 249   Cardiac Enzymes:  Recent Labs  03/24/14 1917 03/25/14 0046 03/25/14 0730  TROPONINI <0.30 <0.30 <0.30   BNP: Invalid input(s): POCBNP D-Dimer: No results for input(s): DDIMER in the last 72 hours. Hemoglobin A1C: No results for input(s): HGBA1C in the last 72 hours. Fasting Lipid Panel: No results for input(s): CHOL, HDL, LDLCALC, TRIG, CHOLHDL, LDLDIRECT in the last 72 hours. Thyroid Function Tests: No results for input(s): TSH, T4TOTAL, T3FREE, THYROIDAB in the last 72 hours.  Invalid input(s): FREET3 Anemia Panel: No results for input(s): VITAMINB12, FOLATE, FERRITIN, TIBC, IRON, RETICCTPCT in the last 72 hours.  RADIOLOGY: Dg Chest 2  View  03/26/2014   CLINICAL DATA:  Shortness of breath  EXAM: CHEST  2 VIEW  COMPARISON:  03/24/2014  FINDINGS: Stable cardiomegaly is again noted. A pacing device and prior bowel surgery are noted. The lungs are well aerated bilaterally. Some scarring is again noted in the right mid and lower lung. No focal confluent infiltrate is seen. No sizable effusion is noted. Mild vascular congestion remains.  IMPRESSION: Stable mild vascular congestion.  Scarring in the right lung base.  No other acute abnormality is noted.   Electronically Signed   By: Alcide Clever M.D.   On: 03/26/2014 14:04   X-ray Chest Pa And Lateral  03/24/2014   CLINICAL DATA:  Shortness of breath.  Congestive heart failure.  EXAM: CHEST  2 VIEW  COMPARISON:  PA and lateral chest 08/20/2013 in 09/14/2012. CT chest 08/10/2012.  FINDINGS: AICD remains in place. There is cardiomegaly and likely mild interstitial edema. No definite consolidative process is identified although the study is limited by the patient's size. No pneumothorax or pleural effusion.  IMPRESSION: Cardiomegaly and likely  mild interstitial edema.   Electronically Signed   By: Drusilla Kanner M.D.   On: 03/24/2014 22:18    PHYSICAL EXAM General: NAD, obese Neck: Thick, JVP difficult but probably better, 9-10 cm, no thyromegaly or thyroid nodule.  Lungs: Decreased breath sounds at bases bilaterally CV: Nondisplaced PMI.  Heart regular S1/S2, no S3/S4, no murmur.  1+ ankle edema.   Abdomen: Soft, nontender, no hepatosplenomegaly, no distention.  Neurologic: Alert and oriented x 3.  Psych: Normal affect. Extremities: No clubbing or cyanosis. Lower legs wrapped.   TELEMETRY: Reviewed telemetry pt in NSR   ] Assessment:   1) A/C diastolic HF - EF 16-07% but RV dilated with HK (05/2013) 2) CKD stage III 3) DM2 4) Morbidly obese 5) h/o DVT - suspected 03/2013 admission, on Xarelto 6) Depression 7) Hypomagnesemia 8) MV repair: Stable on 1/15 echo.  9) Gout  Plan/Discussion:    [name] was admitted from the HF clinic by Internal Medicine for volume overload (appreciate their help) and presumed gout pain. Her volume status was elevated in the setting of dietary indiscretion.   I think volume is looking better.  Creatinine up to 1.5.  Would continue IV diuretics + metolazone x 1 today then back to her home regimen tomorrow.    Will give K supplementation.   Having knee pain, suspect gout.  Now on Uloric, started prednisone 40 mg x 3 days.   Will consult PT/OT for ambulation.  [name] 03/27/2014 8:10 AM

## 2014-03-27 NOTE — Progress Notes (Signed)
Subjective: [name]. Pt completed full breakfast tray and states has no complaints and feels to be at her bedside.   Objective: Vital signs in last 24 hours: Filed Vitals:   03/27/14 0143 03/27/14 0429 03/27/14 0435 03/27/14 0952  BP:  115/74  101/75  Pulse: 98 93  85  Temp:  97.9 F (36.6 C)    TempSrc:  Oral    Resp: 18 20  20   Height:      Weight:   378 lb 12.8 oz (171.823 kg)   SpO2: 100% 98%  98%   Weight change: -1.6 oz (-0.045 kg)  Intake/Output Summary (Last 24 hours) at 03/27/14 0958 Last data filed at 03/27/14 0949  Gross per 24 hour  Intake   1560 ml  Output   3800 ml  Net  -2240 ml   Physical exam- General: resting in bed HEENT: PERRL, EOMI, no scleral icterus Cardiac: RRR, no rubs, murmurs or gallops Pulm: clear to auscultation bilaterally, moving normal volumes of air Abd: soft, nontender, obese,  nondistended, BS present Ext: warm and well perfused, no pedal edema Neuro: alert and oriented X3, cranial nerves II-XII grossly intact   Lab Results: Basic Metabolic Panel:  Recent Labs Lab 03/24/14 1917  03/26/14 0505 03/27/14 0405  NA 141  < > 139 137  K 3.2*  < > 3.2* 2.8*  CL 95*  < > 95* 91*  CO2 32  < > 31 33*  GLUCOSE 100*  < > 195* 219*  BUN 20  < > 25* 32*  CREATININE 1.28*  < > 1.31* 1.52*  CALCIUM 9.5  < > 9.6 9.4  MG 1.7  --   --   --   < > = values in this interval not displayed. Liver Function Tests:  Recent Labs Lab 03/24/14 1917  AST 31  ALT 35  ALKPHOS 153*  BILITOT 0.4  PROT 8.0  ALBUMIN 3.3*   CBC:  Recent Labs Lab 03/24/14 1917  WBC 5.3  HGB 11.1*  HCT 36.1  MCV 84.9  PLT 249   Cardiac Enzymes:  Recent Labs Lab 03/24/14 1917 03/25/14 0046 03/25/14 0730  TROPONINI <0.30 <0.30 <0.30   BNP:  Recent Labs Lab 03/24/14 1917  PROBNP 228.5*   CBG:  Recent Labs Lab 03/25/14 1707 03/25/14 2157 03/26/14 0554 03/26/14 1757 03/26/14 2103 03/27/14 0620  GLUCAP 160* 208* 173* 262* 160* 143*    Studies/Results: X-ray Chest Pa And Lateral 03/24/2014   CLINICAL DATA:  Shortness of breath.  IMPRESSION: Cardiomegaly and likely mild interstitial edema.   Electronically Signed   By: 13/01/2014 M.D.   On: 03/24/2014 22:18   Medications: I have reviewed the patient's current medications. Scheduled Meds: . ALPRAZolam  0.5 mg Oral QHS  . amitriptyline  150 mg Oral QHS  . atorvastatin  40 mg Oral Daily  . carvedilol  12.5 mg Oral BID WC  . febuxostat  40 mg Oral Daily  . furosemide  80 mg Intravenous 3 times per day  . gabapentin  100 mg Oral TID  . insulin aspart  0-20 Units Subcutaneous TID WC  . insulin detemir  40 Units Subcutaneous Q2200  . magnesium oxide  400 mg Oral BID  . methotrexate  15 mg Oral Weekly  . metolazone  2.5 mg Oral Daily  . pantoprazole  80 mg Oral Daily  . polyethylene glycol  17 g Oral Daily  . potassium chloride  40 mEq Oral BID  . potassium chloride  40 mEq Oral Once  . rivaroxaban  20 mg Oral Q supper  . sodium chloride  3 mL Intravenous Q12H   Continuous Infusions:  PRN Meds:.albuterol, HYDROcodone-acetaminophen Assessment/Plan: 1. CHF exacerbation- pt continues to diurese well -4.7L. Wt 378 and unchanged but has had 5 lb weight loss since admission. Pt appears to be beginning contraction as Bun and cr are both increasing and maybe fast approaching end for aggressive diuresis.   -cardiology consulted with recs appreciated -will continue to diuresis with metolazone and lasix  2. Hypokalemia: this AM k 2.8. Pt asymptomatic.  -replace with Kdur 40 BID  -trend bmet   3. Left knee pain-  - Follow up as outpt, will need orthopedics refferal.  4. HTN- Bp 96/78 on admission. Improving, Home meds- Coreg- 12.5, hydralazine- 50mg  TID, Imdur- 60mg  daily, metolazone 5mg  once weekly, spironolactone- 25mg  daily, torsemide- 100mg  BID. - On home coreg- 12.5mg  BID  5. CKD3 - Stable Cr- 1.28, appears to be at baseline. GFR- 55. Cr- 1.31 today, slight bump  from 1.04 yesterday, but about pts baseline. - Avoid nephrotoxics - Bmet daily while diuresing, replace K as indicated. - Cont home mag oxide  6. Diabetes- Home meds- Levemir- 50u daily. Last hgba1c- 03/12/2014- 6.5. -cont levemir at 40 units - SSI-R   7. Depression with anxiety- Cont home xanax- 0.5mg  QHS, amitryptilline- 150mg  daily.  8. OSA- CPAP.  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  Code- Full.   Dispo: Disposition is deferred at this time, awaiting improvement of current medical problems.  Anticipated discharge in approximately 1-2 day(s).   The patient does have a current PCP , MD) and does need an [facility] hospital follow-up appointment after discharge.  The patient does have transportation limitations that hinder transportation to clinic appointments.  .Services Needed at time of discharge: Y = Yes, Blank = No PT:   OT:   RN:   Equipment:   Other:     LOS: 3 days   , MD 03/27/2014, 9:58 AM

## 2014-03-27 NOTE — Progress Notes (Signed)
Pt's potassium this am is 2.8. IMTS on call notified, potassium chloride x 2 doses ordered.

## 2014-03-27 NOTE — Progress Notes (Signed)
Subjective:  Patient not short of breath, feeling well but still complaining of some knee pain. Otherwise, no new complaints or symptoms.  Objective: Vital signs in last 24 hours: Filed Vitals:   03/26/14 2018 03/27/14 0143 03/27/14 0429 03/27/14 0435  BP: 108/80  115/74   Pulse: 95 98 93   Temp: 97.6 F (36.4 C)  97.9 F (36.6 C)   TempSrc: Oral  Oral   Resp: 18 18 20    Height:      Weight:    171.823 kg (378 lb 12.8 oz)  SpO2: 100% 100% 98%    Weight change: -0.045 kg (-1.6 oz)  Intake/Output Summary (Last 24 hours) at 03/27/14 0951 Last data filed at 03/27/14 13/12/15  Gross per 24 hour  Intake   1560 ml  Output   3800 ml  Net  -2240 ml   General appearance: alert, cooperative, appears stated age and no distress Neck: JVP difficult to assess due to patient's body habitus Back: negative Lungs: clear to auscultation bilaterally and normal percussion bilaterally Heart: regular rate and rhythm, S1, S2 normal, no murmur, click, rub or gallop Abdomen: obese, non-tender; bowel sounds normal Extremities: lower extremities are wrapped and difficult to assess, however no pitting edema noted over below the knee. Pulses: 2+ and symmetric  Lab Results: Results for orders placed or performed during the hospital encounter of 03/24/14 (from the past 24 hour(s))  Glucose, capillary     Status: Abnormal   Collection Time: 03/26/14  5:57 PM  Result Value Ref Range   Glucose-Capillary 262 (H) 70 - 99 mg/dL   Comment 1 Documented in Chart    Comment 2 Notify RN   Glucose, capillary     Status: Abnormal   Collection Time: 03/26/14  9:03 PM  Result Value Ref Range   Glucose-Capillary 160 (H) 70 - 99 mg/dL  Basic metabolic panel     Status: Abnormal   Collection Time: 03/27/14  4:05 AM  Result Value Ref Range   Sodium 137 137 - 147 mEq/L   Potassium 2.8 (LL) 3.7 - 5.3 mEq/L   Chloride 91 (L) 96 - 112 mEq/L   CO2 33 (H) 19 - 32 mEq/L   Glucose, Bld 219 (H) 70 - 99 mg/dL   BUN 32 (H)  6 - 23 mg/dL   Creatinine, Ser 13/12/15 (H) 0.50 - 1.10 mg/dL   Calcium 9.4 8.4 - 7.86 mg/dL   GFR calc non Af Amer 38 (L) >90 mL/min   GFR calc Af Amer 44 (L) >90 mL/min   Anion gap 13 5 - 15  Glucose, capillary     Status: Abnormal   Collection Time: 03/27/14  6:20 AM  Result Value Ref Range   Glucose-Capillary 143 (H) 70 - 99 mg/dL    Micro Results: No results found for this or any previous visit (from the past 240 hour(s)). Studies/Results: Dg Chest 2 View  03/26/2014   CLINICAL DATA:  Shortness of breath  EXAM: CHEST  2 VIEW  COMPARISON:  03/24/2014  FINDINGS: Stable cardiomegaly is again noted. A pacing device and prior bowel surgery are noted. The lungs are well aerated bilaterally. Some scarring is again noted in the right mid and lower lung. No focal confluent infiltrate is seen. No sizable effusion is noted. Mild vascular congestion remains.  IMPRESSION: Stable mild vascular congestion.  Scarring in the right lung base.  No other acute abnormality is noted.   Electronically Signed   By: 13/01/2014.D.  On: 03/26/2014 14:04   Medications: I have reviewed the patient's current medications. Scheduled Meds: . ALPRAZolam  0.5 mg Oral QHS  . amitriptyline  150 mg Oral QHS  . atorvastatin  40 mg Oral Daily  . carvedilol  12.5 mg Oral BID WC  . febuxostat  40 mg Oral Daily  . furosemide  80 mg Intravenous 3 times per day  . gabapentin  100 mg Oral TID  . insulin aspart  0-20 Units Subcutaneous TID WC  . insulin detemir  40 Units Subcutaneous Q2200  . magnesium oxide  400 mg Oral BID  . methotrexate  15 mg Oral Weekly  . metolazone  2.5 mg Oral Daily  . pantoprazole  80 mg Oral Daily  . polyethylene glycol  17 g Oral Daily  . potassium chloride  40 mEq Oral BID  . potassium chloride  40 mEq Oral Once  . rivaroxaban  20 mg Oral Q supper  . sodium chloride  3 mL Intravenous Q12H   Continuous Infusions:  PRN Meds:.albuterol, HYDROcodone-acetaminophen Assessment/Plan: Active  Problems:   OSA (obstructive sleep apnea)   Hypertension   Depression   Nonischemic cardiomyopathy   CKD (chronic kidney disease) stage 3, GFR 30-59 ml/min   Controlled type 2 diabetes mellitus with diabetic nephropathy   CHF exacerbation   SOB (shortness of breath)  CHF exacerbation- Most likey due to medication non compliance and dietary indescretion. Last ECHO, poor image quality but estimated EF- 06/2013- 50l-55%, dilated and hypokinetic RV. Per chart the pts weight has been steadily increasing over the past 5 months at least, but recorded weight- 11/2013- 360s. Pt weight today- 383Lbs. SOB likely related to abdominal distension and therefore respiratory compromise, as no crackles on exam.Chest x-ray was poor quality due to body habitus, repeat showed mild vascular congestion. Pro-BNP at 228.5, EKG and troponins normal. - Cards Following, recs apprecaited - Continue Lasix + Metolazone for 1 more day per Cards - K+ 2.8: K-dur 40 x2 today, recheck - Restart Carvedilol 12.5mg  BID, hold rest of BP meds - Cont statin- lipitor- 40 daily - Daily weights - in and out- strict  Knee Pain: Patient has a history of gout, suspected to be having current knee pain secondary to gout. Maintained on Uloric, adding prednisone per Cardiology. - Prednisone 40mg  daily  HTN- Bp 96/78 on examination in clinic. Home meds- Coreg- 12.5, hydralazine- 50mg  TID, Imdur- 60mg  daily, metolazone 5mg  once weekly, spironolactone- 25mg  daily, torsemide- 100mg  BID. -As above  CKD3 - Creatinine bumped to 1.31 from 1.04, expected given diuresis.  - Avoid nephrotoxics - Bmet daily while diuresing, replace K as indicated. - Mag level - Cont home mag oxide  Diabetes- Home meds- Levemir- 50u daily. Last hgba1c- 03/12/2014- 6.5. - Levemir- increase to 40 from 35   - SSI-s   Depression with anxiety- Cont home xanax- 0.5mg  QHS, amitryptilline- 150mg  daily.  OSA- CPAP.  DVT ppx- Cont home xarelto- 20mg  by mouth daily for  chronic DVT, also hx of mitral valve repair.  Code- Full.  Dispo: Disposition is deferred at this time, awaiting improvement of current medical problems. Anticipated discharge in approximately 1 day(s).   The patient does have a current PCP , MD) and does need an [facility] hospital follow-up appointment after discharge.  The patient does have transportation limitations that hinder transportation to clinic appointments.  This is a Note.  The care of the patient was discussed with Dr. and the assessment and plan formulated with their  assistance.  Please see their attached note for official documentation of the daily encounter.   LOS: 3 days   Tamera Punt, Med Student 03/27/2014, 9:51 AM

## 2014-03-27 NOTE — Progress Notes (Signed)
Patient active with [place] [facility] services. Met with patient at bedside who endorses she has been under a lot of stress at home. States she did not take her medication because she was in conflict with her daughter.  [place] noted that patient had questionable suicidal ideations but was later denied by patient while at home. Questioning whether patient could benefit from psych consult while inpatient. Will discuss with inpatient RNCM. Will follow up with [place] Worker regarding patient's housing issues and concerns.  Marthenia Rolling, [facility] [phone]

## 2014-03-27 NOTE — Progress Notes (Signed)
Pt. Has CPAP set up at the bedside on auto titrate min: 5, max: 20 with a FFM. Pt. States that she will place herself on CPAP before going to bed. Pt. Is aware to call if she needs assistance with CPAP.

## 2014-03-27 NOTE — Progress Notes (Signed)
Physical Therapy Treatment Patient Details Name: KRYSTIE LEITER MRN: [id number] DOB: 08-19-1960 Today's Date: 03/27/2014    History of Present Illness Ms. Kinn was admitted from the HF clinic yesterday by Internal Medicine for volume overload    PT Comments    Patient ambulated increased distance with AD and cues. Patient progressing with mobility despite initial reluctance secondary to knee pain. Educated on gentle ROM to assist with bilateral LE pain, discussed concerns for safety with mobility. Also reinforced education related to edema control.  Question patient's willingness to comply.   Follow Up Recommendations  Home health PT;Supervision for mobility/OOB     Equipment Recommendations  Cane    Recommendations for Other Services       Precautions / Restrictions Precautions Precautions: Fall Restrictions Weight Bearing Restrictions: No    Mobility  Bed Mobility               General bed mobility comments: received up in chair, VS assessed  Transfers Overall transfer level: Needs assistance Equipment used: Rolling walker (2 wheeled) Transfers: Sit to/from Stand Sit to Stand: Min guard         General transfer comment: Min guard for safety. Cues for technique. Cues for hand placement, poor compliance.    Ambulation/Gait Ambulation/Gait assistance: Min guard Ambulation Distance (Feet): 140 Feet Assistive device: Rolling walker (2 wheeled) Gait Pattern/deviations: Step-to pattern;Decreased stride length;Drifts right/left;Trunk flexed;Wide base of support Gait velocity: decreased Gait velocity interpretation: <1.8 ft/sec, indicative of risk for recurrent falls General Gait Details: patient with multiple standing rest breaks, VCs for encouragement, min guard for stability with VCs for navigation through hall. patient easily distracted with cues for redirection to task of ambulation.   Stairs            Wheelchair Mobility    Modified  Rankin (Stroke Patients Only)       Balance                                    Cognition Arousal/Alertness: Awake/alert Behavior During Therapy: WFL for tasks assessed/performed Overall Cognitive Status: No family/caregiver present to determine baseline cognitive functioning Area of Impairment: Safety/judgement         Safety/Judgement: Decreased awareness of safety          Exercises      General Comments General comments (skin integrity, edema, etc.): Educated on gentle ROM to assist with bilateral LE pain, discussed concerns for safety with mobility. Also reinforced education related to edema control.  Question patient's willingness to comply.      Pertinent Vitals/Pain Pain Assessment: 0-10 Pain Score: 8  Pain Location: knee pain Pain Descriptors / Indicators: Aching;Constant Pain Intervention(s): Monitored during session;Repositioned    Home Living                      Prior Function            PT Goals (current goals can now be found in the care plan section) Acute Rehab PT Goals Patient Stated Goal: not stated PT Goal Formulation: With patient Time For Goal Achievement: 04/09/14 Potential to Achieve Goals: Good Progress towards PT goals: Progressing toward goals    Frequency  Min 3X/week    PT Plan Current plan remains appropriate    Co-evaluation             End of Session Equipment Utilized During Treatment: Gait belt  Activity Tolerance: Patient limited by fatigue Patient left: in chair;with call bell/phone within reach     Time: 0943-1009 PT Time Calculation (min) (ACUTE ONLY): 26 min  Charges:  $Gait Training: 8-22 mins $Therapeutic Activity: 8-22 mins                    G CodesFabio Asa 2014-04-15, 2:20 PM Charlotte Crumb, PT DPT  [phone]

## 2014-03-27 NOTE — Plan of Care (Signed)
Problem: Phase I Progression Outcomes Goal: Dyspnea controlled at rest (HF) Outcome: Progressing Goal: Pain controlled with appropriate interventions Outcome: Progressing Goal: Up in chair, BRP Outcome: Completed/Met Date Met:  03/27/14 Goal: Initial discharge plan identified Outcome: Progressing Goal: Voiding-avoid urinary catheter unless indicated Outcome: Completed/Met Date Met:  03/27/14 Goal: Hemodynamically stable Outcome: Progressing Goal: Other Phase I Outcomes/Goals Outcome: Progressing

## 2014-03-27 NOTE — Progress Notes (Addendum)
Inpatient Diabetes Program Recommendations  AACE/ADA: New Consensus Statement on Inpatient Glycemic Control (2013)  Target Ranges:  Prepandial:   less than 140 mg/dL      Peak postprandial:   less than 180 mg/dL (1-2 hours)      Critically ill patients:  140 - 180 mg/dL   Results for [name], [name] (MRN [id number]) as of 03/27/2014 09:28  Ref. Range 03/25/2014 21:57 03/26/2014 05:54 03/26/2014 17:57 03/26/2014 21:03 03/27/2014 06:20  Glucose-Capillary Latest Range: 70-99 mg/dL 001 (H) 749 (H) 449 (H) 160 (H) 143 (H)   Reason for assessment: Elevated CBG  Diabetes history: Type 2  Outpatient Diabetes medications: Levemir 30 units qhs Current orders for Inpatient glycemic control: Levemir 40 units q hs, Novolog correction 0-20 units tid with meals  Please consider adding mealtime insulin Novolog 3-4units tid.       Susette Racer, RN, BA, MHA, CDE Diabetes Coordinator Inpatient Diabetes Program  [phone] (Team Pager) [phone] [facility]) 03/27/2014 9:39 AM

## 2014-03-28 DIAGNOSIS — I10 Essential (primary) hypertension: Secondary | ICD-10-CM

## 2014-03-28 DIAGNOSIS — N189 Chronic kidney disease, unspecified: Secondary | ICD-10-CM

## 2014-03-28 DIAGNOSIS — M25562 Pain in left knee: Secondary | ICD-10-CM

## 2014-03-28 DIAGNOSIS — I429 Cardiomyopathy, unspecified: Secondary | ICD-10-CM

## 2014-03-28 LAB — GLUCOSE, CAPILLARY
GLUCOSE-CAPILLARY: 130 mg/dL — AB (ref 70–99)
GLUCOSE-CAPILLARY: 195 mg/dL — AB (ref 70–99)
Glucose-Capillary: 108 mg/dL — ABNORMAL HIGH (ref 70–99)

## 2014-03-28 LAB — BASIC METABOLIC PANEL
Anion gap: 13 (ref 5–15)
BUN: 37 mg/dL — AB (ref 6–23)
CHLORIDE: 93 meq/L — AB (ref 96–112)
CO2: 34 mEq/L — ABNORMAL HIGH (ref 19–32)
Calcium: 9.7 mg/dL (ref 8.4–10.5)
Creatinine, Ser: 1.62 mg/dL — ABNORMAL HIGH (ref 0.50–1.10)
GFR calc Af Amer: 41 mL/min — ABNORMAL LOW (ref >90)
GFR calc non Af Amer: 36 mL/min — ABNORMAL LOW (ref >90)
Glucose, Bld: 131 mg/dL — ABNORMAL HIGH (ref 70–99)
Potassium: 3.5 mEq/L — ABNORMAL LOW (ref 3.7–5.3)
Sodium: 140 mEq/L (ref 137–147)

## 2014-03-28 MED ORDER — POTASSIUM CHLORIDE CRYS ER 20 MEQ PO TBCR
40.0000 meq | EXTENDED_RELEASE_TABLET | Freq: Once | ORAL | Status: AC
  Administered 2014-03-28: 40 meq via ORAL

## 2014-03-28 MED ORDER — TORSEMIDE 100 MG PO TABS
100.0000 mg | ORAL_TABLET | Freq: Two times a day (BID) | ORAL | Status: DC
  Administered 2014-03-28 (×2): 100 mg via ORAL
  Filled 2014-03-28 (×3): qty 1

## 2014-03-28 NOTE — Progress Notes (Signed)
Patient ID: [name], female   DOB: 02/06/1961, 53 y.o.   MRN: [id number]   SUBJECTIVE: Good diuresis yesterday, weight down.  Gout pain improved.   Scheduled Meds: . ALPRAZolam  0.5 mg Oral QHS  . amitriptyline  150 mg Oral QHS  . atorvastatin  40 mg Oral Daily  . carvedilol  12.5 mg Oral BID WC  . febuxostat  40 mg Oral Daily  . gabapentin  100 mg Oral TID  . insulin aspart  0-20 Units Subcutaneous TID WC  . insulin detemir  40 Units Subcutaneous Q2200  . magnesium oxide  400 mg Oral BID  . methotrexate  15 mg Oral Weekly  . metolazone  2.5 mg Oral Daily  . pantoprazole  80 mg Oral Daily  . polyethylene glycol  17 g Oral Daily  . potassium chloride  40 mEq Oral Once  . rivaroxaban  20 mg Oral Q supper  . sodium chloride  3 mL Intravenous Q12H  . torsemide  100 mg Oral BID   Continuous Infusions:  PRN Meds:.albuterol, HYDROcodone-acetaminophen    Filed Vitals:   03/27/14 1500 03/27/14 2033 03/28/14 0326 03/28/14 0331  BP: 104/56 126/76 112/80   Pulse: 88 90 79   Temp: 97.7 F (36.5 C) 97 F (36.1 C) 97.9 F (36.6 C)   TempSrc: Oral Oral Oral   Resp: 20 20 20    Height:      Weight:    376 lb 12.8 oz (170.915 kg)  SpO2: 99% 100% 97%     Intake/Output Summary (Last 24 hours) at 03/28/14 0747 Last data filed at 03/28/14 0600  Gross per 24 hour  Intake   2040 ml  Output   4525 ml  Net  -2485 ml    LABS: Basic Metabolic Panel:  Recent Labs  03/30/14 1335 03/28/14 0327  NA 137 140  K 4.1 3.5*  CL 92* 93*  CO2 32 34*  GLUCOSE 222* 131*  BUN 35* 37*  CREATININE 1.55* 1.62*  CALCIUM 9.9 9.7   Liver Function Tests: No results for input(s): AST, ALT, ALKPHOS, BILITOT, PROT, ALBUMIN in the last 72 hours. No results for input(s): LIPASE, AMYLASE in the last 72 hours. CBC: No results for input(s): WBC, NEUTROABS, HGB, HCT, MCV, PLT in the last 72 hours. Cardiac Enzymes: No results for input(s): CKTOTAL, CKMB, CKMBINDEX, TROPONINI in the last 72  hours. BNP: Invalid input(s): POCBNP D-Dimer: No results for input(s): DDIMER in the last 72 hours. Hemoglobin A1C: No results for input(s): HGBA1C in the last 72 hours. Fasting Lipid Panel: No results for input(s): CHOL, HDL, LDLCALC, TRIG, CHOLHDL, LDLDIRECT in the last 72 hours. Thyroid Function Tests: No results for input(s): TSH, T4TOTAL, T3FREE, THYROIDAB in the last 72 hours.  Invalid input(s): FREET3 Anemia Panel: No results for input(s): VITAMINB12, FOLATE, FERRITIN, TIBC, IRON, RETICCTPCT in the last 72 hours.  RADIOLOGY: Dg Chest 2 View  03/26/2014   CLINICAL DATA:  Shortness of breath  EXAM: CHEST  2 VIEW  COMPARISON:  03/24/2014  FINDINGS: Stable cardiomegaly is again noted. A pacing device and prior bowel surgery are noted. The lungs are well aerated bilaterally. Some scarring is again noted in the right mid and lower lung. No focal confluent infiltrate is seen. No sizable effusion is noted. Mild vascular congestion remains.  IMPRESSION: Stable mild vascular congestion.  Scarring in the right lung base.  No other acute abnormality is noted.   Electronically Signed   By: 13/01/2014.D.  On: 03/26/2014 14:04   X-ray Chest Pa And Lateral  03/24/2014   CLINICAL DATA:  Shortness of breath.  Congestive heart failure.  EXAM: CHEST  2 VIEW  COMPARISON:  PA and lateral chest 08/20/2013 in 09/14/2012. CT chest 08/10/2012.  FINDINGS: AICD remains in place. There is cardiomegaly and likely mild interstitial edema. No definite consolidative process is identified although the study is limited by the patient's size. No pneumothorax or pleural effusion.  IMPRESSION: Cardiomegaly and likely mild interstitial edema.   Electronically Signed   By: Drusilla Kanner M.D.   On: 03/24/2014 22:18    PHYSICAL EXAM General: NAD, obese Neck: Thick, JVP difficult but probably better, 7-8 cm, no thyromegaly or thyroid nodule.  Lungs: Decreased breath sounds at bases bilaterally CV: Nondisplaced PMI.   Heart regular S1/S2, no S3/S4, no murmur.  1+ ankle edema.   Abdomen: Soft, nontender, no hepatosplenomegaly, no distention.  Neurologic: Alert and oriented x 3.  Psych: Normal affect. Extremities: No clubbing or cyanosis. Lower legs wrapped.   TELEMETRY: Reviewed telemetry pt in NSR   ] Assessment:   1) A/C diastolic HF - EF 33-38% but RV dilated with HK (05/2013) 2) CKD stage III 3) DM2 4) Morbidly obese 5) h/o DVT - suspected 03/2013 admission, on Xarelto 6) Depression 7) Hypomagnesemia 8) MV repair: Stable on 1/15 echo.  9) Gout  Plan/Discussion:    [name] was admitted from the HF clinic by Internal Medicine for volume overload (appreciate their help) and presumed gout pain. Her volume status was elevated in the setting of dietary indiscretion.   I think volume is looking better.  Creatinine up to 1.6.  I will put her back on her home regimen of torsemide 100 mg bid.  She can go home on this + metolazone once a week as she was doing at home.  Needs to work on sodium and fluid restriction aggressively.  We'll see back in CHF clinic in 1 week.   Having knee pain, suspect gout.  Now on Uloric, started prednisone 40 mg x 3 days.  Knee is better.   [name] 03/28/2014 7:47 AM

## 2014-03-28 NOTE — Progress Notes (Signed)
1745 discharge instructions given Verbalixed understanding .1800 wheeled to lobby by NT

## 2014-03-28 NOTE — Progress Notes (Signed)
Pt. Continues to request fluids. Pt. Informed that she has a 1200cc fluid restriction. Pt. Stated she still wanted something to drink because her mouth was drty.

## 2014-03-28 NOTE — Progress Notes (Signed)
Subjective:  Patient feels much better and is ready to go home, breathing well and knee pain is improved.  Objective: Vital signs in last 24 hours: Filed Vitals:   03/27/14 1500 03/27/14 2033 03/28/14 0326 03/28/14 0331  BP: 104/56 126/76 112/80   Pulse: 88 90 79   Temp: 97.7 F (36.5 C) 97 F (36.1 C) 97.9 F (36.6 C)   TempSrc: Oral Oral Oral   Resp: 20 20 20    Height:      Weight:    170.915 kg (376 lb 12.8 oz)  SpO2: 99% 100% 97%    Weight change: -0.907 kg (-2 lb)  Intake/Output Summary (Last 24 hours) at 03/28/14 0810 Last data filed at 03/28/14 0600  Gross per 24 hour  Intake   2040 ml  Output   4525 ml  Net  -2485 ml   General appearance: alert, cooperative, appears stated age and no distress Neck: JVP difficult to assess due to patient's body habitus Back: negative Lungs: clear to auscultation bilaterally and normal percussion bilaterally Heart: regular rate and rhythm, S1, S2 normal, no murmur, click, rub or gallop Abdomen: obese, non-tender; bowel sounds normal Extremities: lower extremities are wrapped and difficult to assess, however no pitting edema noted over below the knee. Pulses: 2+ and symmetric  Lab Results: Results for orders placed or performed during the hospital encounter of 03/24/14 (from the past 24 hour(s))  Glucose, capillary     Status: Abnormal   Collection Time: 03/27/14 11:03 AM  Result Value Ref Range   Glucose-Capillary 171 (H) 70 - 99 mg/dL  Basic metabolic panel     Status: Abnormal   Collection Time: 03/27/14  1:35 PM  Result Value Ref Range   Sodium 137 137 - 147 mEq/L   Potassium 4.1 3.7 - 5.3 mEq/L   Chloride 92 (L) 96 - 112 mEq/L   CO2 32 19 - 32 mEq/L   Glucose, Bld 222 (H) 70 - 99 mg/dL   BUN 35 (H) 6 - 23 mg/dL   Creatinine, Ser 13/12/15 (H) 0.50 - 1.10 mg/dL   Calcium 9.9 8.4 - 3.71 mg/dL   GFR calc non Af Amer 37 (L) >90 mL/min   GFR calc Af Amer 43 (L) >90 mL/min   Anion gap 13 5 - 15  Glucose, capillary      Status: Abnormal   Collection Time: 03/27/14  4:19 PM  Result Value Ref Range   Glucose-Capillary 219 (H) 70 - 99 mg/dL  Glucose, capillary     Status: Abnormal   Collection Time: 03/27/14  8:52 PM  Result Value Ref Range   Glucose-Capillary 224 (H) 70 - 99 mg/dL  Basic metabolic panel     Status: Abnormal   Collection Time: 03/28/14  3:27 AM  Result Value Ref Range   Sodium 140 137 - 147 mEq/L   Potassium 3.5 (L) 3.7 - 5.3 mEq/L   Chloride 93 (L) 96 - 112 mEq/L   CO2 34 (H) 19 - 32 mEq/L   Glucose, Bld 131 (H) 70 - 99 mg/dL   BUN 37 (H) 6 - 23 mg/dL   Creatinine, Ser 03/30/14 (H) 0.50 - 1.10 mg/dL   Calcium 9.7 8.4 - 7.89 mg/dL   GFR calc non Af Amer 36 (L) >90 mL/min   GFR calc Af Amer 41 (L) >90 mL/min   Anion gap 13 5 - 15  Glucose, capillary     Status: Abnormal   Collection Time: 03/28/14  6:07 AM  Result Value  Ref Range   Glucose-Capillary 130 (H) 70 - 99 mg/dL    Micro Results: No results found for this or any previous visit (from the past 240 hour(s)). Studies/Results: Dg Chest 2 View  03/26/2014   CLINICAL DATA:  Shortness of breath  EXAM: CHEST  2 VIEW  COMPARISON:  03/24/2014  FINDINGS: Stable cardiomegaly is again noted. A pacing device and prior bowel surgery are noted. The lungs are well aerated bilaterally. Some scarring is again noted in the right mid and lower lung. No focal confluent infiltrate is seen. No sizable effusion is noted. Mild vascular congestion remains.  IMPRESSION: Stable mild vascular congestion.  Scarring in the right lung base.  No other acute abnormality is noted.   Electronically Signed   By: Alcide Clever M.D.   On: 03/26/2014 14:04   Medications: I have reviewed the patient's current medications. Scheduled Meds: . ALPRAZolam  0.5 mg Oral QHS  . amitriptyline  150 mg Oral QHS  . atorvastatin  40 mg Oral Daily  . carvedilol  12.5 mg Oral BID WC  . febuxostat  40 mg Oral Daily  . gabapentin  100 mg Oral TID  . insulin aspart  0-20 Units  Subcutaneous TID WC  . insulin detemir  40 Units Subcutaneous Q2200  . magnesium oxide  400 mg Oral BID  . methotrexate  15 mg Oral Weekly  . metolazone  2.5 mg Oral Daily  . pantoprazole  80 mg Oral Daily  . polyethylene glycol  17 g Oral Daily  . potassium chloride  40 mEq Oral Once  . rivaroxaban  20 mg Oral Q supper  . sodium chloride  3 mL Intravenous Q12H  . torsemide  100 mg Oral BID   Continuous Infusions:  PRN Meds:.albuterol, HYDROcodone-acetaminophen Assessment/Plan: Active Problems:   OSA (obstructive sleep apnea)   Hypertension   Depression   Nonischemic cardiomyopathy   CKD (chronic kidney disease) stage 3, GFR 30-59 ml/min   Controlled type 2 diabetes mellitus with diabetic nephropathy   CHF exacerbation   SOB (shortness of breath)  CHF exacerbation- Most likey due to medication non compliance and dietary indescretion. Last ECHO, poor image quality but estimated EF- 06/2013- 50l-55%, dilated and hypokinetic RV. Per chart the pts weight has been steadily increasing over the past 5 months at least, but recorded weight- 11/2013- 360s. Pt weight today- 383Lbs. SOB likely related to abdominal distension and therefore respiratory compromise, as no crackles on exam.Chest x-ray was poor quality due to body habitus, repeat showed mild vascular congestion. Pro-BNP at 228.5, EKG and troponins normal. - Cards Following, recs apprecaited - Transition back to home torsemide 100mg  BID + metolazone once/week per cards - Stable for discharge - K+ 2.8: K-dur 40 x2 today, recheck - Carvedilol 12.5mg  BID, restart rest of home regimen - Cont statin- lipitor- 40 daily - Daily weights - in and out- strict  Knee Pain: Patient has a history of gout, suspected to be having current knee pain secondary to gout. Maintained on Uloric, adding prednisone per Cardiology. Pain has improved. - Prednisone 40mg  daily x3 days, complete  HTN- Bp 96/78 on examination in clinic. Home meds- Coreg- 12.5,  hydralazine- 50mg  TID, Imdur- 60mg  daily, metolazone 5mg  once weekly, spironolactone- 25mg  daily, torsemide- 100mg  BID. -As above  CKD3 - Creatinine bumped to 1.31 from 1.04, expected given diuresis.  - Avoid nephrotoxics - Bmet daily while diuresing, replace K as indicated. - Mag level - Cont home mag oxide  Diabetes- Home meds- Levemir-  50u daily. Last hgba1c- 03/12/2014- 6.5. - Levemir- increase to 40 from 35   - SSI-s   Depression with anxiety- Cont home xanax- 0.5mg  QHS, amitryptilline- 150mg  daily.  OSA- CPAP.  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  Code- Full.  Dispo: Disposition is deferred at this time, awaiting improvement of current medical problems. Anticipated discharge is today.   The patient does have a current PCP , MD) and does need an [facility] hospital follow-up appointment after discharge.  The patient does have transportation limitations that hinder transportation to clinic appointments.  This is a Harold Barban Note.  The care of the patient was discussed with Dr. [facility] and the assessment and plan formulated with their assistance.  Please see their attached note for official documentation of the daily encounter.   LOS: 4 days   Psychologist, occupational, Med Student 03/28/2014, 8:10 AM

## 2014-03-28 NOTE — Progress Notes (Signed)
Subjective: No events overnight. Stable.  Objective: Vital signs in last 24 hours: Filed Vitals:   03/27/14 1500 03/27/14 2033 03/28/14 0326 03/28/14 0331  BP: 104/56 126/76 112/80   Pulse: 88 90 79   Temp: 97.7 F (36.5 C) 97 F (36.1 C) 97.9 F (36.6 C)   TempSrc: Oral Oral Oral   Resp: 20 20 20    Height:      Weight:    376 lb 12.8 oz (170.915 kg)  SpO2: 99% 100% 97%    Weight change: -2 lb (-0.907 kg)  Intake/Output Summary (Last 24 hours) at 03/28/14 0945 Last data filed at 03/28/14 0600  Gross per 24 hour  Intake   1680 ml  Output   4125 ml  Net  -2445 ml   Physical exam- General: Stable, sitting on bed, ready to go home. HEENT: PERRL, EOMI, no scleral icterus Cardiac: RRR, no rubs, murmurs or gallops Pulm: clear to auscultation bilaterally, moving normal volumes of air, no added sounds Abd: soft, nontender, obese,  nondistended, BS present Ext: warm and well perfused, no pedal edema Neuro: alert and oriented X3, cranial nerves II-XII grossly intact  Lab Results: Basic Metabolic Panel:  Recent Labs Lab 03/24/14 1917  03/27/14 1335 03/28/14 0327  NA 141  < > 137 140  K 3.2*  < > 4.1 3.5*  CL 95*  < > 92* 93*  CO2 32  < > 32 34*  GLUCOSE 100*  < > 222* 131*  BUN 20  < > 35* 37*  CREATININE 1.28*  < > 1.55* 1.62*  CALCIUM 9.5  < > 9.9 9.7  MG 1.7  --   --   --   < > = values in this interval not displayed. Liver Function Tests:  Recent Labs Lab 03/24/14 1917  AST 31  ALT 35  ALKPHOS 153*  BILITOT 0.4  PROT 8.0  ALBUMIN 3.3*   CBC:  Recent Labs Lab 03/24/14 1917  WBC 5.3  HGB 11.1*  HCT 36.1  MCV 84.9  PLT 249   Cardiac Enzymes:  Recent Labs Lab 03/24/14 1917 03/25/14 0046 03/25/14 0730  TROPONINI <0.30 <0.30 <0.30   BNP:  Recent Labs Lab 03/24/14 1917  PROBNP 228.5*   CBG:  Recent Labs Lab 03/26/14 2103 03/27/14 0620 03/27/14 1103 03/27/14 1619 03/27/14 2052 03/28/14 0607  GLUCAP 160* 143* 171* 219* 224*  130*   Studies/Results: X-ray Chest Pa And Lateral 03/24/2014   CLINICAL DATA:  Shortness of breath.  IMPRESSION: Cardiomegaly and likely mild interstitial edema.   Electronically Signed   By: 13/01/2014 M.D.   On: 03/24/2014 22:18   Medications: I have reviewed the patient's current medications. Scheduled Meds: . ALPRAZolam  0.5 mg Oral QHS  . amitriptyline  150 mg Oral QHS  . atorvastatin  40 mg Oral Daily  . carvedilol  12.5 mg Oral BID WC  . febuxostat  40 mg Oral Daily  . gabapentin  100 mg Oral TID  . insulin aspart  0-20 Units Subcutaneous TID WC  . insulin detemir  40 Units Subcutaneous Q2200  . magnesium oxide  400 mg Oral BID  . methotrexate  15 mg Oral Weekly  . metolazone  2.5 mg Oral Daily  . pantoprazole  80 mg Oral Daily  . polyethylene glycol  17 g Oral Daily  . potassium chloride  40 mEq Oral Once  . rivaroxaban  20 mg Oral Q supper  . sodium chloride  3 mL Intravenous Q12H  .  torsemide  100 mg Oral BID   Continuous Infusions:  PRN Meds:.albuterol, HYDROcodone-acetaminophen Assessment/Plan: 1. CHF exacerbation- Total output since admisison- 6.7 L. [id number] today, BUn - 37 and Cr- 1.62, increased with slight elevation in CO2- 34.  Compared to -BUN- 20mg , Cr- 1.28, and CO2- 21 on admission. -cardiology recs appreciated - D/c lasix and Metolazone, cont home meds metolazone on discharge.  2. Hypokalemia: this AM k 3.5.  - Gave KCL-  once - D/c Home on KCl Daily. Follow up in clinic for BMET.    3. Left knee pain-  - Follow up as outpt, will need orthopedics refferal.  4. HTN- Stable on Coreg, but BP on the soft side. Will follow up in clinic and get meds titrated. Home meds- Coreg- 12.5, hydralazine- 50mg  TID, Imdur- 60mg  daily, metolazone 5mg  once weekly, spironolactone- 25mg  daily, torsemide- 100mg  BID. - Discharge home On home coreg- 12.5mg  BID  5. CKD3 - Stable Cr- 1.62, appears to be at baseline. GFR- 55. Cr- 1.31 today, slight bump from  1.04 yesterday, but about pts baseline. - Avoid nephrotoxics - Bmet daily while diuresing, replace K as indicated. - Cont home mag oxide  6. Diabetes- Home meds- Levemir- 50u daily. Last hgba1c- 03/12/2014- 6.5. - Cont Home meds on discharge  7. Depression with anxiety- Cont home xanax- 0.5mg  QHS, amitryptilline- 150mg  daily.  8. OSA- CPAP.  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  Code- Full.   Dispo: Disposition is deferred at this time, awaiting improvement of current medical problems.  Anticipated discharge today.  The patient does have a current PCP , MD) and does need an [facility] hospital follow-up appointment after discharge.  The patient does have transportation limitations that hinder transportation to clinic appointments.  .Services Needed at time of discharge: Y = Yes, Blank = No PT:   OT:   RN:   Equipment:   Other:     LOS: 4 days   , MD 03/28/2014, 9:45 AM

## 2014-03-28 NOTE — Progress Notes (Signed)
Occupational Therapy Treatment Patient Details Name: [name] MRN: [id number] DOB: April 09, 1961 Today's Date: 03/28/2014    History of present illness [name] was admitted from the HF clinic yesterday by Internal Medicine for volume overload   OT comments  [name] admitted for above presents to acute OT making progress towards goals--now at a S level. She will continue to benefit from acute OT with follow up HHOT.  Follow Up Recommendations  Home health OT;Supervision/Assistance - 24 hour    Equipment Recommendations  Hospital bed [facility])       Precautions / Restrictions Precautions Precautions: Fall (due to right knee pain from gout flare) Restrictions Weight Bearing Restrictions: No       Mobility Bed Mobility Overal bed mobility: Modified Independent Bed Mobility: Supine to Sit;Sit to Supine     Supine to sit: Modified independent (Device/Increase time);HOB elevated (use of rails) Sit to supine: Modified independent (Device/Increase time) (use or rails)      Transfers Overall transfer level: Needs assistance   Transfers: Sit to/from Stand Sit to Stand: Supervision                  ADL Overall ADL's : Needs assistance/impaired             Lower Body Bathing: Set up;Supervison/ safety;With adaptive equipment;Sit to/from stand (gave pt a long handled sponge)       Lower Body Dressing: Set up;Supervision/safety;Sit to/from stand (turning sideways on the bed for socks and shoes)   Toilet Transfer: Supervision/safety;Stand-pivot;BSC   Toileting- Clothing Manipulation and Hygiene: Supervision/safety;Sit to/from stand (for front peri and to pull down and up Depends)                          Cognition   Behavior During Therapy: [facility] for tasks assessed/performed Overall Cognitive Status: Within Functional Limits for tasks assessed                                    Pertinent Vitals/ Pain       Pain  Assessment: 0-10 Pain Score: 4  Pain Location: right knee pain due to gout flare Pain Descriptors / Indicators: Aching;Constant Pain Intervention(s): Monitored during session;Repositioned         Frequency Min 2X/week     Progress Toward Goals  OT Goals(current goals can now be found in the care plan section)  Progress towards OT goals: Progressing toward goals     Plan Discharge plan needs to be updated          Activity Tolerance Patient limited by pain   Patient Left in bed;with call bell/phone within reach           Time: 1019-1053 OT Time Calculation (min): 34 min  Charges: OT General Charges $OT Visit: 1 Procedure OT Treatments $Self Care/Home Management : 23-37 mins  Evette Georges [phone] 03/28/2014, 11:07 AM

## 2014-03-31 ENCOUNTER — Ambulatory Visit: Payer: Medicaid Other | Admitting: Pulmonary Disease

## 2014-04-02 ENCOUNTER — Ambulatory Visit (INDEPENDENT_AMBULATORY_CARE_PROVIDER_SITE_OTHER): Payer: Medicaid Other | Admitting: Internal Medicine

## 2014-04-02 ENCOUNTER — Encounter: Payer: Self-pay | Admitting: Internal Medicine

## 2014-04-02 ENCOUNTER — Ambulatory Visit (HOSPITAL_COMMUNITY)
Admission: RE | Admit: 2014-04-02 | Discharge: 2014-04-02 | Disposition: A | Discharge status: Home / Self Care | Payer: Medicare Other | Source: Ambulatory Visit | Attending: Oncology | Admitting: Oncology

## 2014-04-02 VITALS — BP 114/83 | HR 96 | Temp 98.0°F | Wt 377.0 lb

## 2014-04-02 DIAGNOSIS — I428 Other cardiomyopathies: Secondary | ICD-10-CM

## 2014-04-02 DIAGNOSIS — I429 Cardiomyopathy, unspecified: Secondary | ICD-10-CM

## 2014-04-02 DIAGNOSIS — G4733 Obstructive sleep apnea (adult) (pediatric): Secondary | ICD-10-CM | POA: Diagnosis not present

## 2014-04-02 DIAGNOSIS — S39012D Strain of muscle, fascia and tendon of lower back, subsequent encounter: Secondary | ICD-10-CM

## 2014-04-02 DIAGNOSIS — I5032 Chronic diastolic (congestive) heart failure: Secondary | ICD-10-CM

## 2014-04-02 DIAGNOSIS — S39012A Strain of muscle, fascia and tendon of lower back, initial encounter: Secondary | ICD-10-CM | POA: Diagnosis present

## 2014-04-02 DIAGNOSIS — E1121 Type 2 diabetes mellitus with diabetic nephropathy: Secondary | ICD-10-CM

## 2014-04-02 DIAGNOSIS — X58XXXA Exposure to other specified factors, initial encounter: Secondary | ICD-10-CM | POA: Insufficient documentation

## 2014-04-02 DIAGNOSIS — Y999 Unspecified external cause status: Secondary | ICD-10-CM | POA: Diagnosis not present

## 2014-04-02 DIAGNOSIS — I10 Essential (primary) hypertension: Secondary | ICD-10-CM

## 2014-04-02 DIAGNOSIS — M1000 Idiopathic gout, unspecified site: Secondary | ICD-10-CM | POA: Diagnosis not present

## 2014-04-02 DIAGNOSIS — Y939 Activity, unspecified: Secondary | ICD-10-CM | POA: Insufficient documentation

## 2014-04-02 DIAGNOSIS — Y929 Unspecified place or not applicable: Secondary | ICD-10-CM | POA: Insufficient documentation

## 2014-04-02 DIAGNOSIS — F419 Anxiety disorder, unspecified: Secondary | ICD-10-CM | POA: Diagnosis not present

## 2014-04-02 MED ORDER — CYCLOBENZAPRINE HCL 5 MG PO TABS: 5.0000 mg | ORAL_TABLET | Freq: Three times a day (TID) | ORAL | Status: AC | PRN

## 2014-04-02 MED ORDER — ALBUTEROL SULFATE HFA 108 (90 BASE) MCG/ACT IN AERS: INHALATION_SPRAY | RESPIRATORY_TRACT | Status: AC

## 2014-04-02 MED ORDER — DICLOFENAC SODIUM 1 % TD GEL: 2.0000 g | Freq: Four times a day (QID) | TRANSDERMAL | Status: AC

## 2014-04-02 MED ORDER — HYDROXYZINE HCL 10 MG PO TABS: 10.0000 mg | ORAL_TABLET | Freq: Every evening | ORAL | Status: AC | PRN

## 2014-04-02 NOTE — Assessment & Plan Note (Signed)
Patient previously prescribed Xanax for her anxiety, which started after her nephew died in a motorcycle accident. Patient has been [facility] by [facility] and was not given any pharmacotherapy at that appointment.  - Patient is agreeable to stopping the Xanax and trial hydroxyzine for anxiety and sleep.

## 2014-04-02 NOTE — Progress Notes (Signed)
   Subjective:    Patient ID: [name], female    DOB: April 25, 1961, 53 y.o.   MRN: [id number]  HPI  Patient is a 53 year old female with history of congestive heart failure (initially low but last ejection fraction 50-55% from January 2015), type 2 diabetes, schizophrenia, hypertension, objective sleep apnea, chronic kidney disease stage III, mitral regurgitation status post repair 2010 with PFO closure, AICD, anxiety, who presents for a hospital follow-up for admission for acute on chronic congestive heart failure.  Please refer to separate problem-list charting for more details.  Review of Systems  Constitutional: Negative for fever and chills.  HENT: Negative for rhinorrhea and sore throat.   Eyes: Negative for visual disturbance.  Respiratory: Negative for cough and shortness of breath.   Cardiovascular: Positive for leg swelling. Negative for chest pain and palpitations.  Gastrointestinal: Negative for nausea, vomiting, abdominal pain, diarrhea, constipation and blood in stool.  Genitourinary: Negative for dysuria and hematuria.  Neurological: Negative for syncope.      Objective:   Physical Exam  Constitutional: She is oriented to person, place, and time. She appears well-developed and well-nourished. No distress.  HENT:  Head: Normocephalic and atraumatic.  Eyes: EOM are normal. Pupils are equal, round, and reactive to light. Left eye exhibits no discharge.  Neck: Normal range of motion. Neck supple. No thyromegaly present.  Cardiovascular: Normal rate and regular rhythm.  Exam reveals no gallop and no friction rub.   No murmur heard. Pulmonary/Chest: Effort normal and breath sounds normal. No respiratory distress. She has no wheezes. She has no rales.  Abdominal: Soft. Bowel sounds are normal. She exhibits no distension. There is no tenderness. There is no rebound.  Musculoskeletal: She exhibits no edema.  Neurological: She is alert and oriented to person, place, and  time. No cranial nerve deficit.  Skin: Skin is warm and dry. No rash noted.  Psychiatric: She has a normal mood and affect. Thought content normal.      Assessment & Plan:  Please refer to separate problem-list charting for more details.

## 2014-04-02 NOTE — Patient Instructions (Signed)
Please follow-up with heart failure clinic regarding your fluid status. We have also ordered the refills that you have requested. We will also contact you with the results from your back x-ray if there are any abnormal results. We will also touch base with [facility] regarding her bariatric surgery and Dr. Shelle Iron of pulmonology regarding your CPAP.   General Instructions:   Please bring your medicines with you each time you come to clinic.  Medicines may include prescription medications, over-the-counter medications, herbal remedies, eye drops, vitamins, or other pills.   Progress Toward Treatment Goals:  Treatment Goal 03/12/2014  Hemoglobin A1C at goal  Blood pressure at goal  Prevent falls -    Self Care Goals & Plans:  Self Care Goal 03/12/2014  Manage my medications take my medicines as prescribed; bring my medications to every visit; refill my medications on time  Monitor my health check my feet daily; keep track of my blood glucose; bring my glucose meter and log to each visit  Eat healthy foods eat more vegetables; eat foods that are low in salt  Be physically active find an activity I enjoy; take a walk every day  Meeting treatment goals -    Home Blood Glucose Monitoring 03/12/2014  Check my blood sugar -  When to check my blood sugar before meals     Care Management & Community Referrals:  Referral 10/04/2013  Referrals made for care management support none needed  Referrals made to community resources -

## 2014-04-02 NOTE — Assessment & Plan Note (Signed)
Patient continues to have difficulty using her CPAP machine. Patient wishing further follow-up with pulmonology, which she has had some issues with schedule changes and missed appointments.  - Patient to follow-up with Dr. Shelle Iron of pulmonology.

## 2014-04-02 NOTE — Assessment & Plan Note (Signed)
Patient with well-controlled A1c of 6.5 from 3 weeks ago. Patient requesting resources for test strips at home.  - Will touch base with [name] regarding the appropriate test strips for the patient.

## 2014-04-02 NOTE — Progress Notes (Signed)
Patient ID: [name], female   DOB: [birth year], 53 y.o.   MRN: [id number] Internal Medicine Clinic Attending  I saw and evaluated the patient.  I personally confirmed the key portions of the history and exam documented by Dr. Loma Newton and I reviewed pertinent patient test results.  The assessment, diagnosis, and plan were formulated together and I agree with the documentation in the resident's note.

## 2014-04-02 NOTE — Assessment & Plan Note (Signed)
BP Readings from Last 3 Encounters:  04/02/14 114/83  03/28/14 112/80  03/24/14 96/78    Lab Results  Component Value Date   NA 140 03/28/2014   K 3.5* 03/28/2014   CREATININE 1.62* 03/28/2014    Assessment: Blood pressure control: controlled Progress toward BP goal:  at goal   Plan: Medications:  continue current medications

## 2014-04-02 NOTE — Assessment & Plan Note (Addendum)
Patient recently admitted from heart failure clinic onto the internal medicine service for diuresis. Patient is currently on torsemide 100 mg twice a day and metolazone 5 mg at a week with 40 mg by mouth KCl twice a day on the day of metolazone. Patient is also on Coreg 18.75 mg twice a day, Imdur 60 mg daily, hydralazine 50 mg 3 times a day, spironolactone 25 mg daily. Patient is currently 377 lbs, from 376 pounds from discharge from hospital. Patient does not seem fluid overloaded on exam today.  - Patient to follow-up with heart failure clinic on 04/04/2014.  - Continue regimen per heart failure clinic.

## 2014-04-02 NOTE — Assessment & Plan Note (Addendum)
Patient was given a three-day course of prednisone for suspected gout flare while in the hospital. Patient now reporting persistent knee pain, but more consistent with chronic osteoarthritis.

## 2014-04-02 NOTE — Assessment & Plan Note (Addendum)
Patient reporting persistent back pain, already on pain contract with Vicodin. Patient denies any radiation of the pain or any bowel or bladder dysfunction. No weakness in her legs.  - Continue Vicodin  - Refill Voltaren gel  - started on Flexeril  - Obtain plain films  - Continue to encourage weight loss  - Patient pending further evaluation for gastric sleeve procedure. Will touch base with [place] [facility] regarding the status of her evaluation.

## 2014-04-03 ENCOUNTER — Other Ambulatory Visit: Payer: Self-pay | Admitting: Dietician

## 2014-04-03 ENCOUNTER — Telehealth: Payer: Self-pay | Admitting: *Deleted

## 2014-04-03 DIAGNOSIS — E1121 Type 2 diabetes mellitus with diabetic nephropathy: Secondary | ICD-10-CM

## 2014-04-03 NOTE — Telephone Encounter (Signed)
Called patient to verify type of test strips she needs. She also requested pen needles.

## 2014-04-04 ENCOUNTER — Telehealth (HOSPITAL_COMMUNITY): Payer: Self-pay | Admitting: Anesthesiology

## 2014-04-04 ENCOUNTER — Ambulatory Visit (HOSPITAL_COMMUNITY)
Admit: 2014-04-04 | Discharge: 2014-04-04 | Disposition: A | Discharge status: Home / Self Care | Payer: Medicare Other | Source: Ambulatory Visit | Attending: Cardiology | Admitting: Cardiology

## 2014-04-04 ENCOUNTER — Encounter (HOSPITAL_COMMUNITY): Payer: Self-pay

## 2014-04-04 DIAGNOSIS — M25561 Pain in right knee: Secondary | ICD-10-CM

## 2014-04-04 DIAGNOSIS — I82403 Acute embolism and thrombosis of unspecified deep veins of lower extremity, bilateral: Secondary | ICD-10-CM | POA: Insufficient documentation

## 2014-04-04 DIAGNOSIS — N183 Chronic kidney disease, stage 3 unspecified: Secondary | ICD-10-CM

## 2014-04-04 DIAGNOSIS — M25562 Pain in left knee: Secondary | ICD-10-CM

## 2014-04-04 DIAGNOSIS — I5032 Chronic diastolic (congestive) heart failure: Secondary | ICD-10-CM | POA: Insufficient documentation

## 2014-04-04 DIAGNOSIS — M109 Gout, unspecified: Secondary | ICD-10-CM | POA: Diagnosis not present

## 2014-04-04 LAB — BASIC METABOLIC PANEL
Anion gap: 14 (ref 5–15)
BUN: 30 mg/dL — ABNORMAL HIGH (ref 6–23)
CO2: 33 mEq/L — ABNORMAL HIGH (ref 19–32)
CREATININE: 1.79 mg/dL — AB (ref 0.50–1.10)
Calcium: 9.3 mg/dL (ref 8.4–10.5)
Chloride: 94 mEq/L — ABNORMAL LOW (ref 96–112)
GFR calc Af Amer: 36 mL/min — ABNORMAL LOW (ref >90)
GFR calc non Af Amer: 31 mL/min — ABNORMAL LOW (ref >90)
GLUCOSE: 125 mg/dL — AB (ref 70–99)
POTASSIUM: 3.2 meq/L — AB (ref 3.7–5.3)
Sodium: 141 mEq/L (ref 137–147)

## 2014-04-04 MED ORDER — INSULIN PEN NEEDLE 32G X 4 MM MISC: Status: AC

## 2014-04-04 MED ORDER — GLUCOSE BLOOD VI STRP: ORAL_STRIP | Status: DC

## 2014-04-04 NOTE — Telephone Encounter (Signed)
Reviewed labs and renal function slightly elevated. Will hold afternoon torsemide and tomorrow's torsemide then go back to 100 mg BID. K+ low. Continue potassium as scheduled and take extra 20 meq today. Patient aware. Repeat in 1-2 weeks.  [name] B NP-C 12:32 PM

## 2014-04-04 NOTE — Progress Notes (Signed)
Patient ID: [name], female   DOB: 1960/07/20, 53 y.o.   MRN: [id number] PCP: Dr. Loma Newton (Internal Medicine)  53 yo with CHF probably secondary to severe MR now s/p MV repair, rheumatoid arthritis, schizophrenia, overdose of seroquel 2013, S/P St Jude ICD 2013, and PUD.  EF initially low, but most recent echo in 1/15 showed EF 50-55%, stable MV repair, RV dilated and hypokinetic but difficult images. Newly diagnosed DM2, Hgb A1C 13% in April 2015. CKD (Cr 1.4-1.7)  Post Hospital Follow for Heart Failure: Doing well this am. Still having knee pain. Working with CSW to get new place to live. Denies SOB, CP or PND. +chronic orthopnea. Weight at home 361-363 lbs. Paramedicine following patient. Taking all medications. Reports following a low salt diet and no longer eating pickles. Drinking less than 2L a day. Has PT at home.   Labs (11/14): K 4.1 Creatinine 1.96  Labs (1/15): K 3.2, creatinine 2.28 Labs (2/15): K 3.6, creatinine 2.05 Labs (08/26/13): K 3.0 Creatinine 1.6 Labs (11/22/13): K 3.6, creatinine 1.0 Labs (12/04/13): K 3.4, creatinine 2.34 Labs 12/09/13): K 3.8 Cr 1.64 Labs (9/15): K 3.9, creatinine 1.24 Labs (02/19/14) K 3.9 creatinine 1.2  Allergies:  1) ! * Colchicine  2) ! Morphine   Past History:  1. Congestive heart failure, most likely secondary to severe mitral regurgitation. TEE (6/10): EF 40%, diffuse hypokinesis, mild to moderately dilated LV, severe eccentric MR, small PFO. Cardiac MRI (6/10): EF 37% with global hypokinesis, moderate to severe MR, no delayed enhancement (no evidence for sarcoidosis or other infiltrative disease). TTE (10/10) after MV repair: EF 25-30%, moderately dilated LV, moderate diastolic dysfunction, mildly depressed RV function, trivial MR, MV mean gradient 5 mmHg, PASP 30 mmHg. RHC (12/10): mean RA 19, PA 46/26, mean PCWP 28, CI 2.5, SVO2 63%. TTE (2/11): EF 35-40% with diffuse hypokinesis, no significant MR or MS. TTE (7/11): EF 45%, mild global  hypokinesis, s/p MV repair, no mitral regurgitation, minimal mitral stenosis, PA systolic pressure 40 mmHg, mild LV dilation.  TTE (11/12): EF 30-35%, mild LV dilation, mild LVH, s/p MV repair with no regurgitation and minimal stenosis.  TTE (3/13): EF 25-30%, diffuse hypokinesis, no significant mitral stenosis by pressure halftime with mean gradient 7 mmHg across valve, no MR.  Echo (6/13): EF 30-35%, mildly dilated LV, mild mitral stenosis but no MR.  St Jude ICD placed in 8/13. RHC (5/14): mean RA 10, PA 23/10, mean PCWP 17, CI 2.17.  Echo (11/14) with EF 40%, stable repaired MV, mild to moderately dilated RV with mildly decreased systolic function.  Echo (1/15) with EF 50-55%, s/p MV repair with no MS or significant MR, RV poorly visualized but appears hypokinetic and dilated.  2. Severe mitral regurgitation (see above). Minimally invasive mitral valve repair on 12/18/08. She additionally had TV repair and PFO closure.  3. Microcytic anemia: EGD and colonoscopy in 12/11 did not reveal source of bleeding.  4. H/o gastric ulcers.  5. Morbid obesity.  6. Obstructive sleep apnea, on CPAP.  7. GERD.  8. Hypertension, controlled.  9. Rheumatoid arthritis  10. Gout.  11. Depression.  12. LHC (6/10): No angiographic CAD. Mildly dilated LV. 3+ MR. EF 40%.  13. Prior smoking, quit  14. Colitis (2/11)  15. Schizophrenia versus schizoaffective disorder 16. H/o DVT: On Xarelto 17. Syncope while coughing.  18. DM 19. Hemorrhoidal bleeding  Family History:  Negative for cardiac or renal disease.  No FH of Colon Cancer  Social History:  Single,  5 children. Unemployed.  Tobacco Use - Yes. Smoked < 1 ppd, quit 6/10.  Alcohol Use - no  Regular Exercise - no  Drug Use - no, hx crack-cocaine use  Daily Caffeine Use: 2 daily   Review of Systems  All systems reviewed and negative except as per HPI.  Current Outpatient Prescriptions  Medication Sig Dispense Refill  . albuterol (PROAIR HFA) 108 (90  BASE) MCG/ACT inhaler INHALE TWO PUFFS BY MOUTH EVERY 6 HOURS AS NEEDED FOR  WHEEZING  OR  SHORTNESS  OF  BREATH 9 each 0  . amitriptyline (ELAVIL) 150 MG tablet Take 1 tablet (150 mg total) by mouth at bedtime. 30 tablet 5  . atorvastatin (LIPITOR) 40 MG tablet Take 1 tablet (40 mg total) by mouth daily. 30 tablet 11  . BD PEN NEEDLE NANO U/F 32G X 4 MM MISC     . carvedilol (COREG) 12.5 MG tablet TAKE ONE & ONE-HALF TABLETS BY MOUTH TWICE DAILY 90 tablet 3  . cyclobenzaprine (FLEXERIL) 5 MG tablet Take 1 tablet (5 mg total) by mouth every 8 (eight) hours as needed for muscle spasms. 90 tablet 1  . diclofenac sodium (VOLTAREN) 1 % GEL Apply 2 g topically 4 (four) times daily. 100 g 2  . febuxostat (ULORIC) 40 MG tablet Take 40 mg by mouth daily.     . ferrous sulfate 325 (65 FE) MG tablet Take 1 tablet (325 mg total) by mouth 3 (three) times daily with meals. 90 tablet 3  . gabapentin (NEURONTIN) 100 MG capsule Take 1 capsule (100 mg total) by mouth 3 (three) times daily. 90 capsule 0  . HYDROcodone-acetaminophen (NORCO) 10-325 MG per tablet Take 1 tablet by mouth every 6 (six) hours as needed for severe pain. 120 tablet 0  . hydrOXYzine (ATARAX/VISTARIL) 10 MG tablet Take 1 tablet (10 mg total) by mouth at bedtime as needed. 30 tablet 1  . Insulin Detemir (LEVEMIR) 100 UNIT/ML Pen Inject 50 Units into the skin daily at 10 pm. 45 mL 3  . Magnesium Oxide 400 MG CAPS Take 1 capsule (400 mg total) by mouth 2 (two) times daily. 4 capsule 0  . methotrexate (RHEUMATREX) 2.5 MG tablet Take 15 mg by mouth once a week. Takes weekly on Monday    . metolazone (ZAROXOLYN) 2.5 MG tablet Take 5 mg by mouth once a week. Every Tuesday    . nystatin (MYCOSTATIN/NYSTOP) 100000 UNIT/GM POWD Apply 1 Bottle topically 2 (two) times daily. 30 g 0  . omeprazole (PRILOSEC) 40 MG capsule Take 1 capsule (40 mg total) by mouth daily. 30 capsule 3  . polyethylene glycol (MIRALAX / GLYCOLAX) packet Take 17 g by mouth daily as  needed for moderate constipation. 14 each 1  . potassium chloride SA (K-DUR,KLOR-CON) 20 MEQ tablet Take 2 tablets (40 mEq total) by mouth 2 (two) times daily. Take on extra 20 meq on Tuesday with Metolazone    . rivaroxaban (XARELTO) 20 MG TABS tablet Take 1 tablet (20 mg total) by mouth daily with supper. 30 tablet 3  . spironolactone (ALDACTONE) 25 MG tablet TAKE ONE TABLET BY MOUTH ONCE DAILY 30 tablet 3  . torsemide (DEMADEX) 100 MG tablet Take 1 tablet (100 mg total) by mouth 2 (two) times daily. 180 tablet 3  . [DISCONTINUED] QUEtiapine (SEROQUEL) 100 MG tablet Take 400 mg by mouth at bedtime.      No current facility-administered medications for this encounter.   Filed Vitals:   04/04/14 0838  BP:  118/60  Pulse: 102  Resp: 18  Weight: 369 lb 2 oz (167.434 kg)  SpO2: 98%   General: NAD, obese. Sitting in WC Neck: Thick neck, JVP hard to see but appears flat, no thyromegaly or thyroid nodule.  Lungs:Clear  CV: Nondisplaced PMI.  Heart regular S1/S2, no S3/S4, no murmur. No carotid bruit.   .  Abdomen: Soft, markedly obese. Nontender, no distention.  Neurologic: Alert and oriented x 3. Moves all 4 without difficulty Psych: normal affect Extremities: No clubbing or cyanosis. Trace bilateral edema  Assessment/Plan  1. Chronic diastolic CHF:  Nonischemic cardiomyopathy, EF recovered now 50-55% but RV dilated with hypokinesis (echo 1/15).  St Jude ICD.   - Recently discharged from the hospital for A/C HF. She was diuresed with IV lasix and metolazone. Discharge weight was 376 lbs. - Chronic NYHA class III symptoms (chronic, stable) and volume status stable. Will continue torsemide 100 mg BID with metolazone 5 mg once a week. Check BMET today.  - SBP stable will continue current medications.  - She is not on Ace or Arb due to elevated creatinine in past - Lengthy discussion with patient about compliance, following a low salt diet, weighing daily and restricting fluids to less than 2L  a daily. She is doing well currently at home and is working on trying to find new living situation. Praised her for being at appt on time.  2. CKD stage III:  Baseline creatinine 1.3 3. Morbid obesity: Evaluation ongoing in bariatric program, hope for gastric sleeve. Reports she has completed the whole work-up process and is just waiting for the team to make a decision with moving forward. She is very adamant about wanting to move forward and have surgery.  4. DVT: Suspected DVT 11/14 admission.  She is on Xarelto, would probably continue long-term given her inactivity.  5. Mitral valve repair: Stable on 1/15 echo.  6. Gout: Pain much improved from the hospital. Continue Uloric.   F/U 2 weeks [name] NP-C 8:43 AM 04/04/2014

## 2014-04-04 NOTE — Patient Instructions (Signed)
Doing FANTASTIC!!!!  Keep up the good work.  Have a wonderful Thanksgiving but BEHAVE!!!!  Call if weight is trending up.  Follow up in 2 weeks.  Do the following things EVERYDAY: 1) Weigh yourself in the morning before breakfast. Write it down and keep it in a log. 2) Take your medicines as prescribed 3) Eat low salt foods-Limit salt (sodium) to 2000 mg per day.  4) Stay as active as you can everyday 5) Limit all fluids for the day to less than 2 liters 6)

## 2014-04-07 ENCOUNTER — Other Ambulatory Visit (HOSPITAL_COMMUNITY): Payer: Self-pay | Admitting: Cardiology

## 2014-04-07 ENCOUNTER — Telehealth (HOSPITAL_COMMUNITY): Payer: Self-pay | Admitting: Vascular Surgery

## 2014-04-07 ENCOUNTER — Other Ambulatory Visit: Payer: Self-pay | Admitting: *Deleted

## 2014-04-07 ENCOUNTER — Encounter: Payer: Self-pay | Admitting: Internal Medicine

## 2014-04-07 DIAGNOSIS — Z7901 Long term (current) use of anticoagulants: Secondary | ICD-10-CM | POA: Insufficient documentation

## 2014-04-07 DIAGNOSIS — E1121 Type 2 diabetes mellitus with diabetic nephropathy: Secondary | ICD-10-CM

## 2014-04-07 DIAGNOSIS — M25561 Pain in right knee: Secondary | ICD-10-CM

## 2014-04-07 DIAGNOSIS — M25562 Pain in left knee: Principal | ICD-10-CM

## 2014-04-07 DIAGNOSIS — M1710 Unilateral primary osteoarthritis, unspecified knee: Secondary | ICD-10-CM

## 2014-04-07 NOTE — Telephone Encounter (Signed)
Spoke w/Beth, she states pt missed dose of Torsemide on Sat, wt is up 4 lbs, pt is sch to take Metolazone tomorrow, [name] will monitor wt and call back as needed

## 2014-04-07 NOTE — Telephone Encounter (Signed)
PT weight is up 4 lbs since yesterday.. 373.. please dvise

## 2014-04-08 MED ORDER — HYDROCODONE-ACETAMINOPHEN 10-325 MG PO TABS: 1.0000 | ORAL_TABLET | Freq: Four times a day (QID) | ORAL | Status: AC | PRN

## 2014-04-08 MED ORDER — GLUCOSE BLOOD VI STRP: ORAL_STRIP | Status: AC

## 2014-04-09 ENCOUNTER — Telehealth (HOSPITAL_COMMUNITY): Payer: Self-pay | Admitting: Vascular Surgery

## 2014-04-09 NOTE — Telephone Encounter (Signed)
Pt took metolazone today will continue to monitor wt

## 2014-04-09 NOTE — Telephone Encounter (Signed)
[name] called pt called her told her her weight is up 4 lbs 378.Marland Kitchen Pt did not take her metolazone yesterday because she had ran out .Marland Kitchen Daughter picked it up for her she took it today.[name] is not working today if we have any new orders please call nursing and or fax it to nursing fax [phone]

## 2014-04-14 ENCOUNTER — Telehealth (HOSPITAL_COMMUNITY): Payer: Self-pay | Admitting: Surgery

## 2014-04-14 NOTE — Telephone Encounter (Signed)
I left message for Ms. Mcdougall after 2 attempts to reach her.  I instructed her to take an extra dose of 20 meq of Potassium today when she receives the message.

## 2014-04-15 NOTE — Telephone Encounter (Signed)
reviewed

## 2014-04-18 ENCOUNTER — Ambulatory Visit (HOSPITAL_COMMUNITY)
Admission: RE | Admit: 2014-04-18 | Discharge: 2014-04-18 | Disposition: A | Discharge status: Home / Self Care | Payer: Medicare Other | Source: Ambulatory Visit | Attending: Internal Medicine | Admitting: Internal Medicine

## 2014-04-18 ENCOUNTER — Telehealth: Payer: Self-pay | Admitting: Licensed Clinical Social Worker

## 2014-04-18 ENCOUNTER — Ambulatory Visit: Payer: Medicare HMO | Admitting: Pulmonary Disease

## 2014-04-18 VITALS — BP 134/64 | HR 64 | Wt 365.2 lb

## 2014-04-18 DIAGNOSIS — Z9889 Other specified postprocedural states: Secondary | ICD-10-CM | POA: Insufficient documentation

## 2014-04-18 DIAGNOSIS — I129 Hypertensive chronic kidney disease with stage 1 through stage 4 chronic kidney disease, or unspecified chronic kidney disease: Secondary | ICD-10-CM | POA: Insufficient documentation

## 2014-04-18 DIAGNOSIS — N183 Chronic kidney disease, stage 3 (moderate): Secondary | ICD-10-CM | POA: Diagnosis not present

## 2014-04-18 DIAGNOSIS — Z794 Long term (current) use of insulin: Secondary | ICD-10-CM | POA: Insufficient documentation

## 2014-04-18 DIAGNOSIS — M109 Gout, unspecified: Secondary | ICD-10-CM | POA: Diagnosis not present

## 2014-04-18 DIAGNOSIS — I429 Cardiomyopathy, unspecified: Secondary | ICD-10-CM | POA: Diagnosis not present

## 2014-04-18 DIAGNOSIS — G4733 Obstructive sleep apnea (adult) (pediatric): Secondary | ICD-10-CM | POA: Insufficient documentation

## 2014-04-18 DIAGNOSIS — Z79899 Other long term (current) drug therapy: Secondary | ICD-10-CM | POA: Insufficient documentation

## 2014-04-18 DIAGNOSIS — F209 Schizophrenia, unspecified: Secondary | ICD-10-CM | POA: Insufficient documentation

## 2014-04-18 DIAGNOSIS — M069 Rheumatoid arthritis, unspecified: Secondary | ICD-10-CM | POA: Diagnosis not present

## 2014-04-18 DIAGNOSIS — I5032 Chronic diastolic (congestive) heart failure: Secondary | ICD-10-CM | POA: Insufficient documentation

## 2014-04-18 DIAGNOSIS — Z7901 Long term (current) use of anticoagulants: Secondary | ICD-10-CM | POA: Insufficient documentation

## 2014-04-18 DIAGNOSIS — Z86718 Personal history of other venous thrombosis and embolism: Secondary | ICD-10-CM | POA: Diagnosis not present

## 2014-04-18 DIAGNOSIS — Z87891 Personal history of nicotine dependence: Secondary | ICD-10-CM | POA: Insufficient documentation

## 2014-04-18 DIAGNOSIS — E119 Type 2 diabetes mellitus without complications: Secondary | ICD-10-CM | POA: Insufficient documentation

## 2014-04-18 DIAGNOSIS — K219 Gastro-esophageal reflux disease without esophagitis: Secondary | ICD-10-CM | POA: Diagnosis not present

## 2014-04-18 LAB — BASIC METABOLIC PANEL
Anion gap: 13 (ref 5–15)
BUN: 28 mg/dL — ABNORMAL HIGH (ref 6–23)
CO2: 31 meq/L (ref 19–32)
Calcium: 9.3 mg/dL (ref 8.4–10.5)
Chloride: 99 mEq/L (ref 96–112)
Creatinine, Ser: 1.58 mg/dL — ABNORMAL HIGH (ref 0.50–1.10)
GFR calc Af Amer: 42 mL/min — ABNORMAL LOW (ref >90)
GFR calc non Af Amer: 37 mL/min — ABNORMAL LOW (ref >90)
GLUCOSE: 121 mg/dL — AB (ref 70–99)
POTASSIUM: 3.4 meq/L — AB (ref 3.7–5.3)
Sodium: 143 mEq/L (ref 137–147)

## 2014-04-18 LAB — PRO B NATRIURETIC PEPTIDE: Pro B Natriuretic peptide (BNP): 340 pg/mL — ABNORMAL HIGH (ref 0–125)

## 2014-04-18 NOTE — Telephone Encounter (Signed)
CSW placed call to Ms. Jillson to follow up on psychiatry referral to [facility].  Pt states "they said I was on too much medicine and said I didn't have to come back.".  Pt is still linked with [facility] Care Manager.  Ms. Springer states she is planning to move to [place], back home.  However, in the past pt had substance abuse issues in [place]; many family members do not want her to move back.  Pt states her and her daughter are getting along well, but she does not want to come in between her daughter's relationship with boyfriend and it is best for her to move out.  Ms. Ingham will need a back up in case her brother and friend do not show up on Saturday to move her.  Pt states it is the plan, but she has some doubt.  Ms. Ellenberger wanted to look into Assisted Living facilities for housing.  CSW provided Ms. Gaynell Face with names and phone numbers of several Medicaid accepted ALF in [place].  Encouraged pt to contact facility and discuss potential admission and cost.  Pt aware and will notify CSW should any paper work be needed.  If pt does not relocate this weekend, pt aware CSW will be available to assist with needed paper work and documentation for ALF admission.

## 2014-04-18 NOTE — Patient Instructions (Signed)
Will have patient take 5 mg metolazone today with an extra 20 meq (1 tablet) of potassium.  Good luck in [place].   Once you find a cardiologist we will fax all your information to them.  We will contact [facility] to set you up in [place].   Good luck.  F/U 1 month if still here.  Do the following things EVERYDAY: 1) Weigh yourself in the morning before breakfast. Write it down and keep it in a log. 2) Take your medicines as prescribed 3) Eat low salt foods-Limit salt (sodium) to 2000 mg per day.  4) Stay as active as you can everyday 5) Limit all fluids for the day to less than 2 liters 6)

## 2014-04-18 NOTE — Progress Notes (Signed)
Patient ID: [name], female   DOB: January 08, 1961, 53 y.o.   MRN: [id number] PCP: Dr. Loma Newton (Internal Medicine)  53 yo with CHF probably secondary to severe MR now s/p MV repair, rheumatoid arthritis, schizophrenia, overdose of seroquel 2013, S/P St Jude ICD 2013, and PUD.  EF initially low, but most recent echo in 1/15 showed EF 50-55%, stable MV repair, RV dilated and hypokinetic but difficult images. Newly diagnosed DM2, Hgb A1C 13% in April 2015. CKD (Cr 1.4-1.7)   Follow for Heart Failure: Doing well. Still having knee pain. Denies SOB, CP or PND. +chronic orthopnea. + DOE with minimal exertion. Weight at home 368 lbs. Paramedicine following patient. Taking medications as prescribed. Reports R leg swollen since last week. Reports following a low salt diet and drinking less than 2L a day. Has PT at home. Moving to [employer].   Labs (11/14): K 4.1 Creatinine 1.96  Labs (1/15): K 3.2, creatinine 2.28 Labs (2/15): K 3.6, creatinine 2.05 Labs (08/26/13): K 3.0 Creatinine 1.6 Labs (11/22/13): K 3.6, creatinine 1.0 Labs (12/04/13): K 3.4, creatinine 2.34 Labs 12/09/13): K 3.8 Cr 1.64 Labs (9/15): K 3.9, creatinine 1.24 Labs (02/19/14) K 3.9 creatinine 1.2  Allergies:  1) ! * Colchicine  2) ! Morphine   Past History:  1. Congestive heart failure, most likely secondary to severe mitral regurgitation. TEE (6/10): EF 40%, diffuse hypokinesis, mild to moderately dilated LV, severe eccentric MR, small PFO. Cardiac MRI (6/10): EF 37% with global hypokinesis, moderate to severe MR, no delayed enhancement (no evidence for sarcoidosis or other infiltrative disease). TTE (10/10) after MV repair: EF 25-30%, moderately dilated LV, moderate diastolic dysfunction, mildly depressed RV function, trivial MR, MV mean gradient 5 mmHg, PASP 30 mmHg. RHC (12/10): mean RA 19, PA 46/26, mean PCWP 28, CI 2.5, SVO2 63%. TTE (2/11): EF 35-40% with diffuse hypokinesis, no significant MR or MS. TTE (7/11): EF 45%,  mild global hypokinesis, s/p MV repair, no mitral regurgitation, minimal mitral stenosis, PA systolic pressure 40 mmHg, mild LV dilation.  TTE (11/12): EF 30-35%, mild LV dilation, mild LVH, s/p MV repair with no regurgitation and minimal stenosis.  TTE (3/13): EF 25-30%, diffuse hypokinesis, no significant mitral stenosis by pressure halftime with mean gradient 7 mmHg across valve, no MR.  Echo (6/13): EF 30-35%, mildly dilated LV, mild mitral stenosis but no MR.  St Jude ICD placed in 8/13. RHC (5/14): mean RA 10, PA 23/10, mean PCWP 17, CI 2.17.  Echo (11/14) with EF 40%, stable repaired MV, mild to moderately dilated RV with mildly decreased systolic function.  Echo (1/15) with EF 50-55%, s/p MV repair with no MS or significant MR, RV poorly visualized but appears hypokinetic and dilated.  2. Severe mitral regurgitation (see above). Minimally invasive mitral valve repair on 12/18/08. She additionally had TV repair and PFO closure.  3. Microcytic anemia: EGD and colonoscopy in 12/11 did not reveal source of bleeding.  4. H/o gastric ulcers.  5. Morbid obesity.  6. Obstructive sleep apnea, on CPAP.  7. GERD.  8. Hypertension, controlled.  9. Rheumatoid arthritis  10. Gout.  11. Depression.  12. LHC (6/10): No angiographic CAD. Mildly dilated LV. 3+ MR. EF 40%.  13. Prior smoking, quit  14. Colitis (2/11)  15. Schizophrenia versus schizoaffective disorder 16. H/o DVT: On Xarelto 17. Syncope while coughing.  18. DM 19. Hemorrhoidal bleeding  Family History:  Negative for cardiac or renal disease.  No FH of Colon Cancer  Social History:  Single, 5 children. Unemployed.  Tobacco Use - Yes. Smoked < 1 ppd, quit 6/10.  Alcohol Use - no  Regular Exercise - no  Drug Use - no, hx crack-cocaine use  Daily Caffeine Use: 2 daily   Review of Systems  All systems reviewed and negative except as per HPI.  Current Outpatient Prescriptions  Medication Sig Dispense Refill  . albuterol (PROAIR HFA)  108 (90 BASE) MCG/ACT inhaler INHALE TWO PUFFS BY MOUTH EVERY 6 HOURS AS NEEDED FOR  WHEEZING  OR  SHORTNESS  OF  BREATH 9 each 0  . amitriptyline (ELAVIL) 150 MG tablet Take 1 tablet (150 mg total) by mouth at bedtime. 30 tablet 5  . atorvastatin (LIPITOR) 40 MG tablet Take 1 tablet (40 mg total) by mouth daily. 30 tablet 11  . carvedilol (COREG) 12.5 MG tablet TAKE ONE & ONE-HALF TABLETS BY MOUTH TWICE DAILY 90 tablet 3  . cyclobenzaprine (FLEXERIL) 5 MG tablet Take 1 tablet (5 mg total) by mouth every 8 (eight) hours as needed for muscle spasms. 90 tablet 1  . diclofenac sodium (VOLTAREN) 1 % GEL Apply 2 g topically 4 (four) times daily. 100 g 2  . febuxostat (ULORIC) 40 MG tablet Take 40 mg by mouth daily.     . ferrous sulfate 325 (65 FE) MG tablet Take 1 tablet (325 mg total) by mouth 3 (three) times daily with meals. 90 tablet 3  . gabapentin (NEURONTIN) 100 MG capsule Take 1 capsule (100 mg total) by mouth 3 (three) times daily. 90 capsule 0  . glucose blood (FREESTYLE LITE) test strip Check blood sugar up to 3 times a day as instructed 100 each 12  . HYDROcodone-acetaminophen (NORCO) 10-325 MG per tablet Take 1 tablet by mouth every 6 (six) hours as needed for severe pain. 120 tablet 0  . hydrOXYzine (ATARAX/VISTARIL) 10 MG tablet Take 1 tablet (10 mg total) by mouth at bedtime as needed. 30 tablet 1  . Insulin Detemir (LEVEMIR) 100 UNIT/ML Pen Inject 50 Units into the skin daily at 10 pm. 45 mL 3  . Insulin Pen Needle (BD PEN NEEDLE NANO U/F) 32G X 4 MM MISC Use to inject insulin one time a day 100 each 3  . Magnesium Oxide 400 MG CAPS Take 1 capsule (400 mg total) by mouth 2 (two) times daily. 4 capsule 0  . methotrexate (RHEUMATREX) 2.5 MG tablet Take 15 mg by mouth once a week. Takes weekly on Monday    . metolazone (ZAROXOLYN) 2.5 MG tablet Take 5 mg by mouth once a week. Every Tuesday    . nystatin (MYCOSTATIN/NYSTOP) 100000 UNIT/GM POWD Apply 1 Bottle topically 2 (two) times daily.  30 g 0  . omeprazole (PRILOSEC) 40 MG capsule Take 1 capsule (40 mg total) by mouth daily. 30 capsule 3  . polyethylene glycol (MIRALAX / GLYCOLAX) packet Take 17 g by mouth daily as needed for moderate constipation. 14 each 1  . potassium chloride SA (K-DUR,KLOR-CON) 20 MEQ tablet Take 2 tablets (40 mEq total) by mouth 2 (two) times daily. Take on extra 20 meq on Tuesday with Metolazone    . rivaroxaban (XARELTO) 20 MG TABS tablet Take 1 tablet (20 mg total) by mouth daily with supper. 30 tablet 3  . spironolactone (ALDACTONE) 25 MG tablet TAKE ONE TABLET BY MOUTH ONCE DAILY 30 tablet 3  . torsemide (DEMADEX) 100 MG tablet Take 1 tablet (100 mg total) by mouth 2 (two) times daily. 180 tablet 3  . [DISCONTINUED]  QUEtiapine (SEROQUEL) 100 MG tablet Take 400 mg by mouth at bedtime.      No current facility-administered medications for this encounter.   Filed Vitals:   04/18/14 1021  BP: 134/64  Pulse: 64  Weight: 365 lb 4 oz (165.676 kg)  SpO2: 99%   General: NAD, obese. Sitting in WC Neck: Thick neck, JVP hard to see but appears 8, no thyromegaly or thyroid nodule.  Lungs:Clear  CV: Nondisplaced PMI.  Heart regular S1/S2, no S3/S4, no murmur. No carotid bruit.   .  Abdomen: Soft, markedly obese. Nontender, mildly distented.  Neurologic: Alert and oriented x 3. Moves all 4 without difficulty Psych: normal affect Extremities: No clubbing or cyanosis. L trace edema, R woody weeping with 2+ edema.  Assessment/Plan  1. Chronic diastolic CHF:  Nonischemic cardiomyopathy, EF recovered now 50-55% but RV dilated with hypokinesis (echo 1/15).  St Jude ICD.   - Chronic NYHA class III symptoms (chronic, stable) and volume status slightly elevated. Will give metolazone 5 mg today with extra 20 meq of potassium. Will continue torsemide 100 mg BID with metolazone 5 mg on Tuesdays. Check BMEt and pro-BNP today.  - SBP stable will continue current medications.  - She is not on Ace or Arb due to  elevated creatinine in past - Lengthy discussion with patient about compliance, following a low salt diet, weighing daily and restricting fluids to less than 2L a daily. She is doing well currently at home and is working on trying to find new living situation. Praised her for being at appt on time.  2. CKD stage III:  Baseline creatinine 1.3, Check BMET today.  3. Morbid obesity: Evaluation ongoing in bariatric program, hope for gastric sleeve. Reports she has completed the whole work-up process and is just waiting for the team to make a decision with moving forward. She is very adamant about wanting to move forward and have surgery. She reports she is moving to [place] and I told her to contact [facility] once settled there and find another provider and they can fax all her information.  4. DVT: Suspected DVT 11/14 admission.  She is on Xarelto, would probably continue long-term given her inactivity.  5. Mitral valve repair: Stable on 1/15 echo.  6. Gout: Pain much improved from the hospital. Continue Uloric.   F/U 1 month if still here [name] NP-C 10:26 AM 04/18/2014

## 2014-04-21 ENCOUNTER — Telehealth: Payer: Self-pay | Admitting: *Deleted

## 2014-04-21 ENCOUNTER — Ambulatory Visit: Payer: Medicare Other | Admitting: Pulmonary Disease

## 2014-04-21 ENCOUNTER — Telehealth: Payer: Self-pay | Admitting: Pulmonary Disease

## 2014-04-21 NOTE — Telephone Encounter (Signed)
Called spoke with pt. She reports she has moved to [place]. She wants a referral to pulm doc there. She cancelled today's appt. Please advise KC thanks

## 2014-04-21 NOTE — Telephone Encounter (Signed)
Spoke with pt, she is aware that [facility] has no recs for [employer].  States she will check with her new pcp for recs. Nothing further needed.

## 2014-04-21 NOTE — Telephone Encounter (Signed)
I don't know any sleep doctors in [place].  Need to check yellow pages.  Sorry.

## 2014-04-21 NOTE — Telephone Encounter (Signed)
Pt would like a referral to [place] [facility] and dr miles fax: [phone], then her sister states pt has moved to [place] and wants all her records sent to her, she was then transferred to [place] in medical records to assist

## 2014-04-24 ENCOUNTER — Encounter (HOSPITAL_COMMUNITY): Payer: Self-pay | Admitting: Internal Medicine

## 2014-04-28 ENCOUNTER — Telehealth (HOSPITAL_COMMUNITY): Payer: Self-pay | Admitting: Vascular Surgery

## 2014-04-28 NOTE — Telephone Encounter (Signed)
ok 

## 2014-04-28 NOTE — Telephone Encounter (Signed)
Beth called pt has been d[facility].. She moved to [place].Marland Kitchen

## 2014-04-30 ENCOUNTER — Encounter: Payer: Self-pay | Admitting: Cardiology

## 2014-04-30 ENCOUNTER — Telehealth: Payer: Self-pay | Admitting: Cardiology

## 2014-04-30 ENCOUNTER — Encounter: Payer: Medicare Other | Admitting: *Deleted

## 2014-04-30 NOTE — Telephone Encounter (Signed)
Called pt to confirm remote transmission. Pt stated that she has moved to [place] and that her home monitor is in storage. Pt stated that she wants to find a cardiologist in [place] and was hoping that MD could recommended someone to her.

## 2014-05-02 ENCOUNTER — Encounter: Payer: Self-pay | Admitting: Cardiology

## 2014-05-02 NOTE — Telephone Encounter (Signed)
Informed patient Dr. Graciela Husbands recommends Donnalee Curry, MD or Trenton Gammon, MD at [facility] in [place]. She is grateful for the information

## 2014-05-05 ENCOUNTER — Encounter: Payer: Medicaid Other | Admitting: Internal Medicine

## 2014-05-05 ENCOUNTER — Telehealth: Payer: Self-pay | Admitting: *Deleted

## 2014-05-05 NOTE — Telephone Encounter (Signed)
[name] from [place], calls to say pt was recently seen in ED at hospital in [facility] and order was given for unna boots, she needs an order from a physician not related to the ED for at least 2 weeks, she states pt has not found a pcp in the [place] area as of yet, pt withdrew from [facility] 05/01/2014. Could you give an order for 2 weeks of the unna boots so this will give the [facility] enough time to get pt established w/ a physician in [place]?

## 2014-05-06 NOTE — Telephone Encounter (Signed)
Called and gave verbal order, they will fax written orders

## 2014-05-06 NOTE — Telephone Encounter (Signed)
This is fine.  Can I give a verbal to you for the Unna boots?  EBM

## 2014-05-19 ENCOUNTER — Encounter (HOSPITAL_COMMUNITY): Payer: Medicaid Other | Admitting: Internal Medicine

## 2014-05-24 ENCOUNTER — Other Ambulatory Visit: Payer: Self-pay | Admitting: Internal Medicine

## 2014-05-28 ENCOUNTER — Telehealth: Payer: Self-pay | Admitting: *Deleted

## 2014-05-28 NOTE — Telephone Encounter (Signed)
Pt called - needs a new meter. Needed to know type of meter - Freestyle lite. Message left on ID recording. Pt has moved from [place]. [facility]ney Jerrica Thorman RN 05/28/14 4:15PM

## 2014-06-25 ENCOUNTER — Other Ambulatory Visit: Payer: Self-pay

## 2014-06-25 MED ORDER — SPIRONOLACTONE 25 MG PO TABS: 25.0000 mg | ORAL_TABLET | Freq: Every day | ORAL | Status: DC

## 2014-06-27 ENCOUNTER — Encounter: Payer: Self-pay | Admitting: *Deleted

## 2014-07-01 ENCOUNTER — Other Ambulatory Visit: Payer: Self-pay | Admitting: Internal Medicine

## 2014-07-11 ENCOUNTER — Other Ambulatory Visit: Payer: Self-pay | Admitting: *Deleted

## 2014-07-11 MED ORDER — RIVAROXABAN 20 MG PO TABS: 20.0000 mg | ORAL_TABLET | Freq: Every day | ORAL | Status: AC

## 2014-07-16 ENCOUNTER — Other Ambulatory Visit: Payer: Self-pay | Admitting: Internal Medicine

## 2014-07-16 ENCOUNTER — Other Ambulatory Visit (HOSPITAL_COMMUNITY): Payer: Self-pay | Admitting: Anesthesiology

## 2014-07-23 ENCOUNTER — Other Ambulatory Visit (HOSPITAL_COMMUNITY): Payer: Self-pay | Admitting: Anesthesiology

## 2014-08-14 ENCOUNTER — Encounter: Payer: Self-pay | Admitting: *Deleted

## 2014-08-22 ENCOUNTER — Telehealth (HOSPITAL_COMMUNITY): Payer: Self-pay | Admitting: *Deleted

## 2014-08-22 ENCOUNTER — Other Ambulatory Visit (HOSPITAL_COMMUNITY): Payer: Self-pay | Admitting: *Deleted

## 2014-08-22 MED ORDER — TORSEMIDE 100 MG PO TABS: 100.0000 mg | ORAL_TABLET | Freq: Two times a day (BID) | ORAL | Status: AC

## 2014-08-22 NOTE — Telephone Encounter (Signed)
Entered in error

## 2014-09-10 ENCOUNTER — Other Ambulatory Visit (HOSPITAL_COMMUNITY): Payer: Self-pay | Admitting: *Deleted

## 2014-09-10 ENCOUNTER — Encounter: Payer: Self-pay | Admitting: *Deleted

## 2014-10-17 ENCOUNTER — Encounter: Payer: Self-pay | Admitting: *Deleted

## 2014-11-24 ENCOUNTER — Other Ambulatory Visit: Payer: Self-pay | Admitting: Cardiology

## 2014-11-24 MED ORDER — SPIRONOLACTONE 25 MG PO TABS: 25.0000 mg | ORAL_TABLET | Freq: Every day | ORAL | Status: DC

## 2014-12-05 ENCOUNTER — Other Ambulatory Visit: Payer: Self-pay

## 2015-01-07 NOTE — Telephone Encounter (Signed)
Meds were refused 07/01/14 - no longer under [facility] care.

## 2015-02-28 ENCOUNTER — Other Ambulatory Visit: Payer: Self-pay | Admitting: Internal Medicine

## 2015-03-19 ENCOUNTER — Other Ambulatory Visit: Payer: Self-pay | Admitting: Internal Medicine

## 2015-03-19 NOTE — Telephone Encounter (Signed)
[facility] called and informed pt has transferred her care; no longer a pt @ [facility].

## 2015-05-26 ENCOUNTER — Other Ambulatory Visit (HOSPITAL_COMMUNITY): Payer: Self-pay | Admitting: *Deleted

## 2015-05-26 MED ORDER — SPIRONOLACTONE 25 MG PO TABS: 25.0000 mg | ORAL_TABLET | Freq: Every day | ORAL | Status: AC

## 2016-08-08 ENCOUNTER — Telehealth: Payer: Self-pay | Admitting: *Deleted

## 2016-08-08 NOTE — Telephone Encounter (Signed)
Attempted "Home" number--N/A, unable to LM.

## 2016-08-12 NOTE — Telephone Encounter (Signed)
Contacted patient's PCP office in [place]. Patient has an appt with them on 4/5. Nurse plans to call our office or have patient to call our office on 4/5.

## 2016-08-12 NOTE — Telephone Encounter (Signed)
Attempted to call patient on her "Home" number. Unable to LM.
